# Patient Record
Sex: Female | Born: 1980
Health system: Southern US, Community
[De-identification: ages and names within clinical notes are randomized; demographics above are authoritative.]

## PROBLEM LIST (undated history)

## (undated) DIAGNOSIS — Z590 Homelessness: Secondary | ICD-10-CM

## (undated) DIAGNOSIS — D649 Anemia, unspecified: Secondary | ICD-10-CM

## (undated) DIAGNOSIS — M1990 Unspecified osteoarthritis, unspecified site: Secondary | ICD-10-CM

## (undated) DIAGNOSIS — C469 Kaposi's sarcoma, unspecified: Secondary | ICD-10-CM

## (undated) DIAGNOSIS — F419 Anxiety disorder, unspecified: Secondary | ICD-10-CM

## (undated) DIAGNOSIS — F339 Major depressive disorder, recurrent, unspecified: Secondary | ICD-10-CM

## (undated) DIAGNOSIS — Z5189 Encounter for other specified aftercare: Secondary | ICD-10-CM

## (undated) DIAGNOSIS — F329 Major depressive disorder, single episode, unspecified: Secondary | ICD-10-CM

## (undated) DIAGNOSIS — L0291 Cutaneous abscess, unspecified: Secondary | ICD-10-CM

## (undated) DIAGNOSIS — J45909 Unspecified asthma, uncomplicated: Secondary | ICD-10-CM

## (undated) DIAGNOSIS — J189 Pneumonia, unspecified organism: Secondary | ICD-10-CM

## (undated) DIAGNOSIS — L98499 Non-pressure chronic ulcer of skin of other sites with unspecified severity: Secondary | ICD-10-CM

## (undated) DIAGNOSIS — F32A Depression, unspecified: Secondary | ICD-10-CM

## (undated) DIAGNOSIS — Z21 Asymptomatic human immunodeficiency virus [HIV] infection status: Secondary | ICD-10-CM

## (undated) DIAGNOSIS — B20 Human immunodeficiency virus [HIV] disease: Secondary | ICD-10-CM

## (undated) DIAGNOSIS — L989 Disorder of the skin and subcutaneous tissue, unspecified: Secondary | ICD-10-CM

## (undated) DIAGNOSIS — G63 Polyneuropathy in diseases classified elsewhere: Secondary | ICD-10-CM

## (undated) HISTORY — DX: Anxiety disorder, unspecified: F41.9

## (undated) HISTORY — DX: Major depressive disorder, recurrent, unspecified: F33.9

## (undated) HISTORY — DX: Asymptomatic human immunodeficiency virus (hiv) infection status: Z21

## (undated) HISTORY — DX: Major depressive disorder, single episode, unspecified: F32.9

## (undated) HISTORY — DX: Homelessness: Z59.0

## (undated) HISTORY — DX: Human immunodeficiency virus (HIV) disease: B20

## (undated) HISTORY — DX: Disorder of the skin and subcutaneous tissue, unspecified: L98.9

## (undated) HISTORY — DX: Non-pressure chronic ulcer of skin of other sites with unspecified severity: L98.499

## (undated) HISTORY — DX: Kaposi's sarcoma, unspecified: C46.9

## (undated) HISTORY — DX: Depression, unspecified: F32.A

## (undated) HISTORY — DX: Polyneuropathy in diseases classified elsewhere: G63

---

## 2003-06-23 HISTORY — PX: TUBAL LIGATION: SHX77

## 2003-08-01 ENCOUNTER — Inpatient Hospital Stay (HOSPITAL_COMMUNITY): Admission: AD | Admit: 2003-08-01 | Discharge: 2003-08-01 | Payer: Self-pay | Admitting: Obstetrics and Gynecology

## 2003-08-28 ENCOUNTER — Inpatient Hospital Stay (HOSPITAL_COMMUNITY): Admission: AD | Admit: 2003-08-28 | Discharge: 2003-08-28 | Payer: Self-pay | Admitting: Family Medicine

## 2003-09-20 ENCOUNTER — Inpatient Hospital Stay (HOSPITAL_COMMUNITY): Admission: AD | Admit: 2003-09-20 | Discharge: 2003-09-20 | Payer: Self-pay | Admitting: Obstetrics & Gynecology

## 2003-12-03 ENCOUNTER — Encounter: Admission: RE | Admit: 2003-12-03 | Discharge: 2003-12-03 | Payer: Self-pay | Admitting: Internal Medicine

## 2003-12-03 ENCOUNTER — Ambulatory Visit (HOSPITAL_COMMUNITY): Admission: RE | Admit: 2003-12-03 | Discharge: 2003-12-03 | Payer: Self-pay | Admitting: Internal Medicine

## 2003-12-03 ENCOUNTER — Encounter (INDEPENDENT_AMBULATORY_CARE_PROVIDER_SITE_OTHER): Payer: Self-pay | Admitting: *Deleted

## 2003-12-03 LAB — CONVERTED CEMR LAB
CD4 Count: 350 microliters
CD4 T Cell Abs: 350

## 2003-12-18 ENCOUNTER — Encounter: Admission: RE | Admit: 2003-12-18 | Discharge: 2003-12-18 | Payer: Self-pay | Admitting: Internal Medicine

## 2004-01-01 ENCOUNTER — Encounter: Admission: RE | Admit: 2004-01-01 | Discharge: 2004-01-01 | Payer: Self-pay | Admitting: Internal Medicine

## 2004-01-16 ENCOUNTER — Encounter: Admission: RE | Admit: 2004-01-16 | Discharge: 2004-01-16 | Payer: Self-pay | Admitting: Internal Medicine

## 2004-01-16 ENCOUNTER — Ambulatory Visit (HOSPITAL_COMMUNITY): Admission: RE | Admit: 2004-01-16 | Discharge: 2004-01-16 | Payer: Self-pay | Admitting: Internal Medicine

## 2004-01-16 ENCOUNTER — Encounter (INDEPENDENT_AMBULATORY_CARE_PROVIDER_SITE_OTHER): Payer: Self-pay | Admitting: *Deleted

## 2004-01-16 LAB — CONVERTED CEMR LAB
CD4 Count: 350 microliters
HIV 1 RNA Quant: 399 copies/mL

## 2004-01-29 ENCOUNTER — Encounter (INDEPENDENT_AMBULATORY_CARE_PROVIDER_SITE_OTHER): Payer: Self-pay | Admitting: *Deleted

## 2004-01-29 ENCOUNTER — Inpatient Hospital Stay (HOSPITAL_COMMUNITY): Admission: RE | Admit: 2004-01-29 | Discharge: 2004-02-01 | Payer: Self-pay | Admitting: Obstetrics

## 2004-04-10 ENCOUNTER — Encounter (INDEPENDENT_AMBULATORY_CARE_PROVIDER_SITE_OTHER): Payer: Self-pay | Admitting: *Deleted

## 2004-04-10 ENCOUNTER — Ambulatory Visit (HOSPITAL_COMMUNITY): Admission: RE | Admit: 2004-04-10 | Discharge: 2004-04-10 | Payer: Self-pay | Admitting: Internal Medicine

## 2004-04-10 ENCOUNTER — Ambulatory Visit: Payer: Self-pay | Admitting: Internal Medicine

## 2004-04-10 LAB — CONVERTED CEMR LAB
CD4 Count: 450 microliters
HIV 1 RNA Quant: 4500 copies/mL

## 2004-06-13 ENCOUNTER — Emergency Department (HOSPITAL_COMMUNITY): Admission: EM | Admit: 2004-06-13 | Discharge: 2004-06-13 | Payer: Self-pay | Admitting: Emergency Medicine

## 2004-11-19 ENCOUNTER — Emergency Department (HOSPITAL_COMMUNITY): Admission: EM | Admit: 2004-11-19 | Discharge: 2004-11-19 | Payer: Self-pay | Admitting: Emergency Medicine

## 2004-11-24 ENCOUNTER — Inpatient Hospital Stay (HOSPITAL_COMMUNITY): Admission: EM | Admit: 2004-11-24 | Discharge: 2004-11-27 | Payer: Self-pay | Admitting: Emergency Medicine

## 2004-11-24 ENCOUNTER — Ambulatory Visit: Payer: Self-pay | Admitting: Infectious Diseases

## 2004-12-24 ENCOUNTER — Ambulatory Visit: Payer: Self-pay | Admitting: Infectious Diseases

## 2005-08-13 ENCOUNTER — Emergency Department (HOSPITAL_COMMUNITY): Admission: EM | Admit: 2005-08-13 | Discharge: 2005-08-13 | Payer: Self-pay | Admitting: Emergency Medicine

## 2005-08-19 ENCOUNTER — Emergency Department (HOSPITAL_COMMUNITY): Admission: EM | Admit: 2005-08-19 | Discharge: 2005-08-19 | Payer: Self-pay | Admitting: Emergency Medicine

## 2006-07-05 ENCOUNTER — Emergency Department (HOSPITAL_COMMUNITY): Admission: EM | Admit: 2006-07-05 | Discharge: 2006-07-05 | Payer: Self-pay | Admitting: Emergency Medicine

## 2006-08-09 ENCOUNTER — Emergency Department (HOSPITAL_COMMUNITY): Admission: EM | Admit: 2006-08-09 | Discharge: 2006-08-09 | Payer: Self-pay | Admitting: Emergency Medicine

## 2006-08-16 ENCOUNTER — Encounter (INDEPENDENT_AMBULATORY_CARE_PROVIDER_SITE_OTHER): Payer: Self-pay | Admitting: *Deleted

## 2006-08-16 LAB — CONVERTED CEMR LAB

## 2006-08-29 ENCOUNTER — Encounter (INDEPENDENT_AMBULATORY_CARE_PROVIDER_SITE_OTHER): Payer: Self-pay | Admitting: *Deleted

## 2008-06-20 ENCOUNTER — Encounter: Payer: Self-pay | Admitting: Infectious Diseases

## 2008-06-28 ENCOUNTER — Ambulatory Visit: Payer: Self-pay | Admitting: Infectious Diseases

## 2008-06-28 DIAGNOSIS — B20 Human immunodeficiency virus [HIV] disease: Secondary | ICD-10-CM | POA: Insufficient documentation

## 2008-06-28 LAB — CONVERTED CEMR LAB
ALT: 14 units/L (ref 0–35)
AST: 16 units/L (ref 0–37)
Albumin: 3.9 g/dL (ref 3.5–5.2)
Alkaline Phosphatase: 88 units/L (ref 39–117)
BUN: 9 mg/dL (ref 6–23)
Basophils Absolute: 0 10*3/uL (ref 0.0–0.1)
Basophils Relative: 0 % (ref 0–1)
Bilirubin Urine: NEGATIVE
CO2: 21 meq/L (ref 19–32)
Calcium: 8.7 mg/dL (ref 8.4–10.5)
Chlamydia, Swab/Urine, PCR: NEGATIVE
Chloride: 105 meq/L (ref 96–112)
Cholesterol: 140 mg/dL (ref 0–200)
Creatinine, Ser: 0.51 mg/dL (ref 0.40–1.20)
Eosinophils Absolute: 0.1 10*3/uL (ref 0.0–0.7)
Eosinophils Relative: 3 % (ref 0–5)
GC Probe Amp, Urine: NEGATIVE
Glucose, Bld: 90 mg/dL (ref 70–99)
HCT: 39.5 % (ref 36.0–46.0)
HCV Ab: NEGATIVE
HDL: 48 mg/dL (ref >39)
HIV 1 RNA Quant: 125000 copies/mL — ABNORMAL HIGH (ref <48)
HIV-1 RNA Quant, Log: 5.1 — ABNORMAL HIGH (ref <1.68)
HIV-1 antibody: POSITIVE — AB
HIV-2 Ab: NEGATIVE
HIV: REACTIVE
Hemoglobin, Urine: NEGATIVE
Hemoglobin: 12.4 g/dL (ref 12.0–15.0)
Hep B Core Total Ab: NEGATIVE
Hep B S Ab: NEGATIVE
Hepatitis B Surface Ag: NEGATIVE
Ketones, ur: NEGATIVE mg/dL
LDL Cholesterol: 78 mg/dL (ref 0–99)
Leukocytes, UA: NEGATIVE
Lymphocytes Relative: 38 % (ref 12–46)
Lymphs Abs: 1 10*3/uL (ref 0.7–4.0)
MCHC: 31.4 g/dL (ref 30.0–36.0)
MCV: 78.4 fL (ref 78.0–100.0)
Monocytes Absolute: 0.3 10*3/uL (ref 0.1–1.0)
Monocytes Relative: 12 % (ref 3–12)
Neutro Abs: 1.2 10*3/uL — ABNORMAL LOW (ref 1.7–7.7)
Neutrophils Relative %: 48 % (ref 43–77)
Nitrite: NEGATIVE
Platelets: 138 10*3/uL — ABNORMAL LOW (ref 150–400)
Potassium: 3.5 meq/L (ref 3.5–5.3)
Protein, ur: NEGATIVE mg/dL
RBC: 5.04 M/uL (ref 3.87–5.11)
RDW: 15.8 % — ABNORMAL HIGH (ref 11.5–15.5)
Sodium: 138 meq/L (ref 135–145)
Specific Gravity, Urine: 1.022 (ref 1.005–1.03)
Total Bilirubin: 1.1 mg/dL (ref 0.3–1.2)
Total CHOL/HDL Ratio: 2.9
Total Protein: 8.1 g/dL (ref 6.0–8.3)
Triglycerides: 72 mg/dL (ref <150)
Urine Glucose: NEGATIVE mg/dL
Urobilinogen, UA: 1 (ref 0.0–1.0)
VLDL: 14 mg/dL (ref 0–40)
WBC: 2.6 10*3/uL — ABNORMAL LOW (ref 4.0–10.5)
pH: 7.5 (ref 5.0–8.0)

## 2008-07-17 ENCOUNTER — Ambulatory Visit: Payer: Self-pay | Admitting: Infectious Diseases

## 2008-07-17 ENCOUNTER — Encounter (INDEPENDENT_AMBULATORY_CARE_PROVIDER_SITE_OTHER): Payer: Self-pay | Admitting: *Deleted

## 2008-07-17 ENCOUNTER — Telehealth: Payer: Self-pay | Admitting: Infectious Diseases

## 2008-07-17 DIAGNOSIS — F329 Major depressive disorder, single episode, unspecified: Secondary | ICD-10-CM

## 2008-07-17 DIAGNOSIS — F419 Anxiety disorder, unspecified: Secondary | ICD-10-CM | POA: Insufficient documentation

## 2008-10-01 ENCOUNTER — Encounter: Payer: Self-pay | Admitting: Infectious Diseases

## 2008-12-05 ENCOUNTER — Telehealth (INDEPENDENT_AMBULATORY_CARE_PROVIDER_SITE_OTHER): Payer: Self-pay | Admitting: *Deleted

## 2009-05-27 ENCOUNTER — Emergency Department (HOSPITAL_COMMUNITY): Admission: EM | Admit: 2009-05-27 | Discharge: 2009-05-27 | Payer: Self-pay | Admitting: Emergency Medicine

## 2009-09-20 ENCOUNTER — Emergency Department (HOSPITAL_COMMUNITY): Admission: EM | Admit: 2009-09-20 | Discharge: 2009-09-20 | Payer: Self-pay | Admitting: Emergency Medicine

## 2009-10-16 ENCOUNTER — Telehealth: Payer: Self-pay

## 2009-11-07 ENCOUNTER — Encounter: Payer: Self-pay | Admitting: Infectious Diseases

## 2010-01-01 ENCOUNTER — Telehealth (INDEPENDENT_AMBULATORY_CARE_PROVIDER_SITE_OTHER): Payer: Self-pay | Admitting: Licensed Clinical Social Worker

## 2010-01-01 ENCOUNTER — Ambulatory Visit: Payer: Self-pay | Admitting: Internal Medicine

## 2010-01-02 ENCOUNTER — Encounter: Payer: Self-pay | Admitting: Internal Medicine

## 2010-01-02 LAB — CONVERTED CEMR LAB
ALT: 13 units/L (ref 0–35)
AST: 22 units/L (ref 0–37)
Albumin: 3.4 g/dL — ABNORMAL LOW (ref 3.5–5.2)
Alkaline Phosphatase: 78 units/L (ref 39–117)
BUN: 12 mg/dL (ref 6–23)
Basophils Absolute: 0 10*3/uL (ref 0.0–0.1)
Basophils Relative: 0 % (ref 0–1)
CO2: 25 meq/L (ref 19–32)
Calcium: 8.3 mg/dL — ABNORMAL LOW (ref 8.4–10.5)
Chlamydia, Swab/Urine, PCR: NEGATIVE
Chloride: 106 meq/L (ref 96–112)
Creatinine, Ser: 0.62 mg/dL (ref 0.40–1.20)
Eosinophils Absolute: 0.1 10*3/uL (ref 0.0–0.7)
Eosinophils Relative: 6 % — ABNORMAL HIGH (ref 0–5)
GC Probe Amp, Urine: NEGATIVE
Glucose, Bld: 88 mg/dL (ref 70–99)
HCT: 32.2 % — ABNORMAL LOW (ref 36.0–46.0)
HIV 1 RNA Quant: 176000 copies/mL — ABNORMAL HIGH (ref <48)
HIV-1 RNA Quant, Log: 5.25 — ABNORMAL HIGH (ref <1.68)
Hemoglobin: 10.3 g/dL — ABNORMAL LOW (ref 12.0–15.0)
Lymphocytes Relative: 34 % (ref 12–46)
Lymphs Abs: 0.8 10*3/uL (ref 0.7–4.0)
MCHC: 32 g/dL (ref 30.0–36.0)
MCV: 81.1 fL (ref 78.0–100.0)
Monocytes Absolute: 0.2 10*3/uL (ref 0.1–1.0)
Monocytes Relative: 10 % (ref 3–12)
Neutro Abs: 1.2 10*3/uL — ABNORMAL LOW (ref 1.7–7.7)
Neutrophils Relative %: 51 % (ref 43–77)
Platelets: 112 10*3/uL — ABNORMAL LOW (ref 150–400)
Potassium: 3.6 meq/L (ref 3.5–5.3)
RBC: 3.97 M/uL (ref 3.87–5.11)
RDW: 14.9 % (ref 11.5–15.5)
Sodium: 140 meq/L (ref 135–145)
Total Bilirubin: 0.6 mg/dL (ref 0.3–1.2)
Total Protein: 7.6 g/dL (ref 6.0–8.3)
WBC: 2.4 10*3/uL — ABNORMAL LOW (ref 4.0–10.5)

## 2010-01-14 ENCOUNTER — Encounter (INDEPENDENT_AMBULATORY_CARE_PROVIDER_SITE_OTHER): Payer: Self-pay | Admitting: *Deleted

## 2010-01-17 ENCOUNTER — Ambulatory Visit: Payer: Self-pay | Admitting: Internal Medicine

## 2010-01-17 DIAGNOSIS — D069 Carcinoma in situ of cervix, unspecified: Secondary | ICD-10-CM

## 2010-01-21 ENCOUNTER — Encounter (INDEPENDENT_AMBULATORY_CARE_PROVIDER_SITE_OTHER): Payer: Self-pay | Admitting: *Deleted

## 2010-01-22 ENCOUNTER — Encounter: Payer: Self-pay | Admitting: Internal Medicine

## 2010-01-22 LAB — CONVERTED CEMR LAB

## 2010-01-24 ENCOUNTER — Encounter: Payer: Self-pay | Admitting: Internal Medicine

## 2010-01-31 ENCOUNTER — Encounter: Payer: Self-pay | Admitting: Internal Medicine

## 2010-03-20 ENCOUNTER — Telehealth (INDEPENDENT_AMBULATORY_CARE_PROVIDER_SITE_OTHER): Payer: Self-pay | Admitting: *Deleted

## 2010-03-28 ENCOUNTER — Encounter: Payer: Self-pay | Admitting: Internal Medicine

## 2010-05-06 ENCOUNTER — Encounter (INDEPENDENT_AMBULATORY_CARE_PROVIDER_SITE_OTHER): Payer: Self-pay | Admitting: *Deleted

## 2010-06-08 ENCOUNTER — Emergency Department (HOSPITAL_COMMUNITY)
Admission: EM | Admit: 2010-06-08 | Discharge: 2010-06-08 | Payer: Self-pay | Source: Home / Self Care | Admitting: Emergency Medicine

## 2010-07-22 NOTE — Miscellaneous (Signed)
Summary: Orders Update  Clinical Lists Changes  Orders: Added new Test order of T-CD4SP [facility]) (CD4SP) - Signed Added new Test order of T-CBC w/Diff [phone]) - Signed Added new Test order of T-Comprehensive Metabolic Panel [phone]) - Signed Added new Test order of T-RPR (Syphilis) [phone]) - Signed Added new Test order of T-HIV Viral Load [phone]) - Signed Added new Test order of T-GC Probe, urine [phone]) - Signed Added new Test order of T-Chlamydia  Probe, urine [phone]) - Signed

## 2010-07-22 NOTE — Miscellaneous (Signed)
Summary: clinical update/ryan white NCADAP app completed  Clinical Lists Changes  Observations: Added new observation of PCTFPL: 41.88  (01/21/2010 9:54) Added new observation of HOUSEINCOME: 32951  (01/21/2010 9:54) Added new observation of FINASSESSDT: 01/17/2010  (01/21/2010 9:54)

## 2010-07-22 NOTE — Progress Notes (Signed)
Summary: Needs bridge management  Phone Note Other Incoming   Summary of Call: Reviewed my list of patients.  She has not been seen in over a year.  Has bridge management contacted her? Beverly [name] Initial call taken by: Clydie Braun MD,  October 16, 2009 9:31 PM

## 2010-07-22 NOTE — Miscellaneous (Signed)
Summary: RW Update  Clinical Lists Changes  Observations: Added new observation of PAYOR: Medicaid (03/28/2010 11:00)

## 2010-07-22 NOTE — Progress Notes (Signed)
Summary: Missed GYN appt. 9/14, pt. needs to call for GYN appt. [facility]  Phone Note Outgoing Call   Call placed by: Jennet Maduro RN,  March 20, 2010 12:24 PM Call placed to: Patient Summary of Call: Message left.  Pt. needs to Call [facility] clinic to reschedule a missed appt. for Colpo following abnormal pap smear.  GYN Clinic number left for pt. to call for appt. Jennet Maduro RN  March 20, 2010 12:25 PM

## 2010-07-22 NOTE — Miscellaneous (Signed)
Summary: Problem list update  Clinical Lists Changes  Problems: Added new problem of SCREENING FOR MALIGNANT NEOPLASM OF THE CERVIX (ICD-V76.2) 

## 2010-07-22 NOTE — Miscellaneous (Signed)
Summary: clinical update/Beverly [name] ncadap apprv til 09/20/10  Clinical Lists Changes  Medications: Rx of BACTRIM DS 800-160 MG TABS (SULFAMETHOXAZOLE-TRIMETHOPRIM) one daily;  #30 x 6;  Signed;  Entered by: Paulo Fruit  BS,CPht II,MPH;  Authorized by: Yisroel Ramming MD;  Method used: Electronically to [facility] [phone]*, [address], [place], [place]  [address], Ph: [phone], Fax:  Rx of AZITHROMYCIN 600 MG TABS (AZITHROMYCIN) 2 by mouth once a week;  #8 x 6;  Signed;  Entered by: Paulo Fruit  BS,CPht II,MPH;  Authorized by: Yisroel Ramming MD;  Method used: Electronically to [facility] [phone]*, [address], [place], [place]  [address], Ph: [phone], Fax:  Rx of ATRIPLA 600-200-300 MG TABS (EFAVIRENZ-EMTRICITAB-TENOFOVIR) one by mouth once daily;  #30 x 6;  Signed;  Entered by: Paulo Fruit  BS,CPht II,MPH;  Authorized by: Yisroel Ramming MD;  Method used: Electronically to [facility] [phone]*, [address], [place], [place]  [address], Ph: [phone], Fax: Observations: Added new observation of AIDSDAP: Yes 2011 (01/31/2010 11:02)    Prescriptions: ATRIPLA 600-200-300 MG TABS (EFAVIRENZ-EMTRICITAB-TENOFOVIR) one by mouth once daily  #30 x 6   Entered by:   Paulo Fruit  BS,CPht II,MPH   Authorized by:   Yisroel Ramming MD   Signed by:   Paulo Fruit  BS,CPht II,MPH on 01/31/2010   Method used:   Electronically to        [facility] [phone]* (retail)       [address]       [place], [place]  [address]       Ph: [phone]       Fax:    RxID:   [id number] AZITHROMYCIN 600 MG TABS (AZITHROMYCIN) 2 by mouth once a week  #8 x 6   Entered by:   Paulo Fruit  BS,CPht II,MPH   Authorized by:   Yisroel Ramming MD   Signed by:   Paulo Fruit  BS,CPht II,MPH on 01/31/2010   Method used:   Electronically to        [facility] [phone]* (retail)       [address]       [place], [place]  [address]       Ph: [phone]       Fax:    RxID:   [id number] BACTRIM DS 800-160 MG TABS (SULFAMETHOXAZOLE-TRIMETHOPRIM)  one daily  #30 x 6   Entered by:   Paulo Fruit  BS,CPht II,MPH   Authorized by:   Yisroel Ramming MD   Signed by:   Paulo Fruit  BS,CPht II,MPH on 01/31/2010   Method used:   Electronically to        [facility] [phone]* (retail)       [address]       [place], [place]  [address]       Ph: [phone]       Fax:    RxID:   [id number]  Paulo Fruit  BS,CPht II,MPH  January 31, 2010 11:03 AM

## 2010-07-22 NOTE — Assessment & Plan Note (Signed)
Summary: f/u labs   Primary Provider:  Clydie Braun MD  CC:  follow-up visit, lab results, and c/o boils underarms.  History of Present Illness: Pt here to re-establish. She took her Atripla for a short period of time but then lost her medicaid.  She did not have any side effects. She re-applied for her Medicaid and got her card but her status in our computer systme is inactive.  She was instructed to speak to her Medicaid worker. She is depressed and anxious.  She recently lost her mother to cancer and her father was just diagnose with cancer.  She is taking care of her 4 children by herself and feels very stressed. She is not suicidal. She wants to be ther for her children.  She has trouble dealing with the fact that she is HIV positive and the only person in hr family that knows is her sister. Pt c/o recurrent boils, otherwise feels well.  Preventive Screening-Counseling & Management  Alcohol-Tobacco     Alcohol drinks/day: occasional     Smoking Status: current     Smoking Cessation Counseling: yes     Packs/Day: 2-3 cig     Year Started: 2005  Caffeine-Diet-Exercise     Caffeine use/day: tea, soda 4 per day     Does Patient Exercise: yes     Type of exercise: WALKING     Times/week: 5  Hep-HIV-STD-Contraception     Sun Exposure-Excessive: occasionally  Safety-Violence-Falls     Seat Belt Use: 100      Sexual History:  dating.        Drug Use:  Yes.    Comments: pt. given condoms   Updated Prior Medication List: ALPRAZOLAM 0.5 MG TBDP (ALPRAZOLAM) two times a day BACTRIM DS 800-160 MG TABS (SULFAMETHOXAZOLE-TRIMETHOPRIM) one daily AZITHROMYCIN 600 MG TABS (AZITHROMYCIN) 2 by mouth once a week ATRIPLA 600-200-300 MG TABS (EFAVIRENZ-EMTRICITAB-TENOFOVIR) one by mouth once daily  Current Allergies (reviewed today): No known allergies  Past History:  Past Medical History: Last updated: 07/17/2008 HIV - dxed during pregnancy 2005 - with her fourth child - child  without HIV PNA- strep 2006 Asthma - stable Anxiety Anemia  Social History: Sexual History:  dating  Review of Systems  The patient denies anorexia, fever, and weight loss.    Vital Signs:  Patient profile:   30 year old female Height:      61.5 inches (156.21 cm) Weight:      162.8 pounds (74 kg) BMI:     30.37 Temp:     98.4 degrees F (36.89 degrees C) oral Pulse rate:   67 / minute BP sitting:   119 / 81  (left arm)  Vitals Entered By: Wendall Mola CMA Duncan Dull) (January 17, 2010 3:15 PM) CC: follow-up visit, lab results, c/o boils underarms Is Patient Diabetic? No Pain Assessment Patient in pain? no      Nutritional Status BMI of > 30 = obese Nutritional Status Detail appetite "good"  Does patient need assistance? Functional Status Self care Ambulation Normal Comments pt. has been out of meds for one month, lost Medicaid and Juanell Fairly needs to be updated   Physical Exam  General:  alert, well-developed, well-nourished, and well-hydrated.  tearful Head:  normocephalic and atraumatic.   Mouth:  pharynx pink and moist.  no thrush  Lungs:  normal breath sounds.   Heart:  normal rate and regular rhythm.      Impression & Recommendations:  Problem # 1:  HIV  INFECTION (ICD-042) Discussed need to re-start HIV meds and prophylactic meds. Discussed the importance of taking them consistantly.  Will have her meet with Byrd Hesselbach to enroll in ADAP in case she does not have Medicaid. She will return in 6 weeks for repeat labs. Diagnostics Reviewed:  HIV: REACTIVE (06/28/2008)   HIV-Western blot: Positive (06/28/2008)   CD4: <10 cmm (01/03/2010)   WBC: 2.4 (01/02/2010)   Hgb: 10.3 (01/02/2010)   HCT: 32.2 (01/02/2010)   Platelets: 112 (01/02/2010) HIV genotype: See Comment (06/28/2008)   HIV-1 RNA: 125000 (06/28/2008)   HBSAg: NEG (06/28/2008)  Orders: Est. Patient Level IV (99214)Future Orders: T-CD4SP ([facility]) (CD4SP) ... 02/28/2010 T-HIV Viral Load [id number])  ... 02/28/2010 T-Comprehensive Metabolic Panel [phone]) ... 02/28/2010 T-CBC w/Diff ([id number]) ... 02/28/2010  Problem # 2:  ANXIETY STATE, UNSPECIFIED (ICD-300.00) refer to [facility] for counseling xana for anxiety Her updated medication list for this problem includes:    Alprazolam 0.5 Mg Tbdp (Alprazolam) .Marland Kitchen..Marland Kitchen Two times a day  Problem # 3:  SCREENING FOR MALIGNANT NEOPLASM OF THE CERVIX (ICD-V76.2) PAP today  Patient Instructions: 1)  Please schedule a follow-up appointment in 8 weeks, 2 weeks after labs.  Prescriptions: ALPRAZOLAM 0.5 MG TBDP (ALPRAZOLAM) two times a day  #60 x 0   Entered and Authorized by:   Yisroel Ramming MD   Signed by:   Yisroel Ramming MD on 01/17/2010   Method used:   Print then Give to Patient   RxID:   [id number]

## 2010-07-22 NOTE — Progress Notes (Signed)
Summary: patient wanting labs/office visit.  Phone Note Call from Patient   Caller: Patient Summary of Call: Patient called today wanting an appt to see the doctor and lab. I scheduled her an appt for today for labs and 01/15/2010 for office visit. She states she has been taking her meds but her rx was about to run out thats why she needed to be seen. I  talked to [facility] where she states she was getting her meds and the pharmacists stated she haven't p/u rx since last year 11/2008. Not sure if the patient was getting meds somewhere else, or just not taking them. Initial call taken by: Starleen Arms CMA,  January 01, 2010 10:29 AM

## 2010-07-22 NOTE — Miscellaneous (Signed)
Summary: Orders Update - GYN Referral  Clinical Lists Changes  Problems: Added new problem of PAP SMER CERV W/LW GRADE SQUAMOUS INTRAEPITH LES (ICD-795.03) Orders: Added new Referral order of Gynecologic Referral (Gyn) - Signed

## 2010-07-22 NOTE — Letter (Signed)
Summary: Return Notice    Return Notice   Imported By: Florinda Marker 12/30/2009 14:11:39  _____________________________________________________________________  External Attachment:    Type:   Image     Comment:   External Document

## 2010-07-22 NOTE — Assessment & Plan Note (Signed)
Summary: Pap visit   Vital Signs:  Patient profile:   30 year old female Menstrual status:  irregular LMP:     12/16/2009 Height:      61.5 inches (156.21 cm) Weight:      162.8 pounds (74 kg) BMI:     30.37 Temp:     98.4 degrees F (36.89 degrees C) oral Pulse rate:   67 / minute BP sitting:   119 / 81  (left arm)  Vitals Entered By: Wendall Mola CMA Duncan Dull) (January 17, 2010 3:06 PM)  Patient Instructions: 1)  Your results will come back in about a week.  I will send you a letter in the mail at that time. 2)  Please schedule a follow-up appointment in 1 year.   CC:  PAP smear visit.  Pt given condoms.  Pt given educational materials re:  HIV and women, diet, exercise, BSE, PAP smears, and self-esteem and prevention for positives..  CC: PAP smear visit.  Pt given condoms.  Pt given educational materials re:  HIV and women, diet, exercise, BSE, PAP smears, self-esteem and prevention for positives. Is Patient Diabetic? No Pain Assessment Patient in pain? no      Nutritional Status BMI of > 30 = obese Nutritional Status Detail appetite "good" LMP (date): 12/16/2009 LMP - Character: normal     Menstrual Status irregular Enter LMP: 12/16/2009   Preventive Screening-Counseling & Management  Alcohol-Tobacco     Alcohol drinks/day: occasional     Smoking Status: current     Smoking Cessation Counseling: yes     Packs/Day: 2-3 cig     Year Started: 2005  Caffeine-Diet-Exercise     Caffeine use/day: tea,soda 4 per day     Does Patient Exercise: yes     Type of exercise: WALKING     Times/week: 5  Hep-HIV-STD-Contraception     Sun Exposure-Excessive: occasionally  Safety-Violence-Falls     Seat Belt Use: 100      Sexual History:  dating.        Drug Use:  Yes.    Comments: pt. given condoms  Behavioral Health Assessment Frequency: occasional Do you use recreational drugs? Yes  Evaluation and Follow-Up  Prevention For Positives: 01/17/2010   Safe  sex practices discussed with patient. Condoms offered. Prior Medications: ALPRAZOLAM 0.5 MG TBDP (ALPRAZOLAM) two times a day BACTRIM DS 800-160 MG TABS (SULFAMETHOXAZOLE-TRIMETHOPRIM) one daily AZITHROMYCIN 600 MG TABS (AZITHROMYCIN) 2 by mouth once a week ATRIPLA 600-200-300 MG TABS (EFAVIRENZ-EMTRICITAB-TENOFOVIR) one by mouth once daily Current Allergies: No known allergies  Orders Added: 1)  Est. Patient Level I [[address]109] 2)  T-PAP [facility]) [[address]]              Prevention For Positives: 01/17/2010   Safe sex practices discussed with patient. Condoms offered.

## 2010-07-22 NOTE — Letter (Signed)
Summary: Results Follow-up Letter  [facility]  [address]   [place], [place] [address]   Phone: [phone]  Fax: [phone]       January 24, 2010  [address] [place], [place]  [address]  Dear Ms. Hodge,   The following are the results of your recent test(s):  Test     Result     Pap Smear    Normal_______  Not Normal_XXX____       Comments:  Your PAP smear results returned abnormal.  Dr. Philipp Deputy has ordered a referral to the [facility].  Your appointment is:   Date/Time:  Friday, February 28, 2010 at 12:45 PM  Please arrive          15 minutes early and bring your [employer] or any insurance cards with you.   Address:   [address]       [place], [place] [address]   Tel. #:   [phone]  Please call the GYN Clinic if you need to reschedule this appointment.  As a courtesy to other patients if you need to cancel your appointment please do so at least 24 hours in advance.  Thank you.  Sincerely,    Jennet Maduro [facility]

## 2010-07-22 NOTE — Miscellaneous (Signed)
  Clinical Lists Changes  Observations: Added new observation of YEARAIDSPOS: 2010  (05/06/2010 15:38)

## 2010-08-09 ENCOUNTER — Encounter (INDEPENDENT_AMBULATORY_CARE_PROVIDER_SITE_OTHER): Payer: Self-pay | Admitting: *Deleted

## 2010-08-13 NOTE — Miscellaneous (Signed)
  Clinical Lists Changes  Observations: Added new observation of PAYOR: No Insurance (08/09/2010 12:04)

## 2010-08-18 ENCOUNTER — Emergency Department (HOSPITAL_COMMUNITY)
Admission: EM | Admit: 2010-08-18 | Discharge: 2010-08-18 | Disposition: A | Discharge status: Home / Self Care | Payer: Self-pay | Attending: Emergency Medicine | Admitting: Emergency Medicine

## 2010-08-18 DIAGNOSIS — L03319 Cellulitis of trunk, unspecified: Secondary | ICD-10-CM | POA: Insufficient documentation

## 2010-08-18 DIAGNOSIS — L02219 Cutaneous abscess of trunk, unspecified: Secondary | ICD-10-CM | POA: Insufficient documentation

## 2010-09-07 LAB — T-HELPER CELL (CD4) - (RCID CLINIC ONLY)
CD4 % Helper T Cell: 3 % — ABNORMAL LOW (ref 33–55)
CD4 T Cell Abs: <10 uL — ABNORMAL LOW (ref 400–2700)

## 2010-10-06 LAB — T-HELPER CELL (CD4) - (RCID CLINIC ONLY)
CD4 % Helper T Cell: 7 % — ABNORMAL LOW (ref 33–55)
CD4 T Cell Abs: 50 uL — ABNORMAL LOW (ref 400–2700)

## 2010-11-07 NOTE — Op Note (Signed)
NAMEGALYA, Beverly [name]                        ACCOUNT NO.:  [id number]   MEDICAL RECORD NO.:  [id number]                   PATIENT TYPE:  INP   LOCATION:  9116                                 FACILITY:  [facility]   PHYSICIAN:  Kathreen Cosier, M.D.           DATE OF BIRTH:  1981-03-03   DATE OF PROCEDURE:  DATE OF DISCHARGE:                                 OPERATIVE REPORT   PREOPERATIVE DIAGNOSIS:  Positive human immunodeficiency virus, multiparity,  elective cesarean section, tubal ligation.   POSTOPERATIVE DIAGNOSIS:  Positive human immunodeficiency virus,  multiparity, elective cesarean section, tubal ligation.   OPERATION PERFORMED:   SURGEON:  Kathreen Cosier, M.D.   FIRST ASSISTANT:  Dr. _________   ANESTHESIA:  Spinal.   DESCRIPTION OF PROCEDURE:  Patient placed on the operating table in supine  position after the spinal administered.  Abdomen prepped and draped.  Bladder emptied with a Foley catheter.  Transverse suprapubic incision was  made and carried down to the rectus fascia.  Fascia _________  the left of  incision.  Rectus muscles were retracted laterally.  Peritoneum __________.  Transverse incision made in the visceral peritoneum.  Bladder flap mobilized  inferiorly.  Transverse lower uterine incision.  Fluid was clear.  The  patient was delivered from the LOA position.  The cord was prolapsed beside  the head.  She had a female, Apgar 9 and 9, weighing 6 pounds, 14 ounces.  A  team was in attendance and fluid was clear.  Placenta was anterior and  removed manually.  Uterine cavity cleaned with dry laps.  Uterine incision  was closed in one layer with continuous suture of #1 chromic.  Hemostasis  was satisfactory.  Bladder flap reattached with 2-0 chromic.  Tubes and  ovaries normal.  The right tube grasped in the midportion with a Babcock  clamp.  0 plain suture placed in mesosalpinx below the portion of the tube  that had been clamped.  This was tied on  both sides and approximately one  inch of tube transected.  Hemostasis was satisfactory.  The procedure was  done in exact fashion the other side.  Lap and sponge counts correct.  Abdomen closed in layers.  Peritoneum continuous suture of 0 chromic.  Fascia continuous suture of 0 Dexon and the skin was closed with  subcuticular stitch of 4-0 Monocryl.  Blood loss was 600 mL.  The patient  tolerated the procedure well and was taken to recovery room in good  condition.                                               Kathreen Cosier, M.D.    BAM/MEDQ  D:  01/29/2004  T:  01/29/2004  Job:  [id number]

## 2010-11-07 NOTE — H&P (Signed)
[name], Beverly [name]              ACCOUNT NO.:  [id number]   MEDICAL RECORD NO.:  [id number]          PATIENT TYPE:  INP   LOCATION:  1830                         FACILITY:  [facility]   PHYSICIAN:  Georgann Housekeeper, MD      DATE OF BIRTH:  06/01/1981   DATE OF ADMISSION:  11/23/2004  DATE OF DISCHARGE:                                HISTORY & PHYSICAL   CHIEF COMPLAINT:  Vomiting, coughing, and fever.   HISTORY OF PRESENT ILLNESS:  A 30 year old female with history of mild  asthma, depression, and HIV positive came in complaining of this coughing  and nausea.  Started having fevers about 3 or 4 days ago, was seen in the  emergency room and was told that it may be viral.  Continuous to worsen with  cough, yellowish bloody mucous, nauseous and short of breath, and chest  discomfort when she coughs.  Reportedly feeling weak and the chest x-ray  showed infiltrate at this time, elevated white count and also with mild  dehydration with elevated creatinine.  The patient has had headache and  dizziness.  Occasionally take Atrovent for her asthma.   PAST MEDICAL HISTORY:  Mild asthma since childhood, depression, and anemia.  HIV positive a year ago from her child.  She was seen by the ID Clinic, was  treated with drugs during the pregnancy.   ALLERGIES:  No known drug allergies.   MEDICATIONS:  Atrovent occasionally.   PAST SURGICAL HISTORY:  Cesarean section.   SOCIAL HISTORY:  Tobacco positive, alcohol occasional.  No IV drugs.  Lives  with sister in [place].  Family is in [place].  Four children, last one is  a year old.  Does not work.   FAMILY HISTORY:  Mother had lung cancer.  Father is doing okay.   PHYSICAL EXAMINATION:  VITAL SIGNS:  Blood pressure 113/58, pulse 92, with  98% saturations, 103.9 was temperature initially.  GENERAL:  Young, ill-appearing female in no acute distress.  Complaining of  some nausea.  LUNGS:  Decreased breath sounds on the right with occasional  rhonchi and  wheeze.  HEENT:  Pupils reactive, extraocular muscles intact.  HEART:  Regular S1 and S2 without any murmurs.  ABDOMEN:  Soft with good bowel sounds.  Nontender and nondistended.  EXTREMITIES:  No edema, no rashes.   LABORATORY DATA:  White count of 13.6, hemoglobin 9.0, potassium 2.2, BUN  19, creatinine 1.7.   Chest x-ray shows right infiltrate.  Blood cultures x2 was sent.   IMPRESSION:  A 30 year old female with asthma, anemia, HIV positive not  being followed regularly.   Problem 1.  Right lung pneumonia, most likely community acquired.   Problem 2.  Dehydration secondary to nausea and vomiting.   Problem 3.  Hypokalemia secondary to nausea and vomiting.   Problem 4.  Anemia, question chronic.   PLAN:  Admit to hospital.  IV antibiotics with Rocephin, Azithromycin.  IV  fluids.  Potassium replacement.  Check electrolytes and creatinine.  Anemia,  check iron studies and thyroid.  Will also check CD4 count.      KH/MEDQ  D:  11/24/2004  T:  11/24/2004  Job:  [id number]

## 2010-11-07 NOTE — Discharge Summary (Signed)
[name], [name]              ACCOUNT NO.:  [id number]   MEDICAL RECORD NO.:  [id number]          PATIENT TYPE:  INP   LOCATION:  3009                         FACILITY:  [facility]   PHYSICIAN:  Deirdre Peer. Polite, M.D. DATE OF BIRTH:  May 11, 1981   DATE OF ADMISSION:  11/23/2004  DATE OF DISCHARGE:                                 DISCHARGE SUMMARY   DISCHARGE DIAGNOSES:  1.  Right lower lobe pneumonia, strep pneumonia.  2.  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  Please note patient off      antiretroviral greater than one year.  3.  Tobacco use.  4.  Reactive depression.  5.  Anemia secondary to iron deficiency plus or minus component of chronic      disease secondary to human immunodeficiency virus.   DISCHARGE MEDICATIONS:  1.  Avelox 400 mg 1 p.o. daily.  2.  Bactrim DS one three times a week.  3.  Albuterol p.r.n.  4.  Ferrous sulfate 325 mg b.i.d. post completion of Avelox.   DISPOSITION:  The patient is being discharged home in stable condition and  has been seen in consultation by ID.  Plans for followup in the ID clinic on  July 5.  The patient has been seen by case management, as well as social  service and arrangements have been made for the patient to start on  Medicaid.   STUDIES:  CBC at discharge:  White count 9.8, hemoglobin 10.2, platelets  287,000.  CD4 140.  BMET within normal limits except for hypokalemia.  RPR  nonreactive.  Chest x-ray:  Right lower lobe pneumonia.   PROCEDURE:  Status post two units of packed red blood cells.   HISTORY OF PRESENT ILLNESS:  This 30 year old female with known history of  HIV, presented to the ED for evaluation of fever/cough.  Chest x-ray  consistent with pneumonia.  CBC showing leukocytosis.  The patient was  febrile.  Admission was deemed necessary for further evaluation and  treatment.  Please see dictated H&P for further details.   PAST  MEDICAL HISTORY:  As stated above.  Significant for mild asthma,  depression and anemia, HIV positive.   MEDICATIONS ON ADMISSION:  Atrovent p.r.n.   ALLERGIES:  NO KNOWN DRUG ALLERGIES.   PAST SURGICAL HISTORY:  History of C-section in the past.   SOCIAL HISTORY:  Positive tobacco use, occasional alcohol, no IV drugs.   FAMILY HISTORY:  Mother had lung cancer.  Father okay.   HOSPITAL COURSE:  The patient was admitted to the medicine floor for  evaluation and treatment of pneumonia.  The patient was pan cultured,  started on empiric antibiotics.  The patient had slow response to above  treatments.  Continued to have intermittent fever.  The patient's fever  ultimately defervesced, cultures did show streptococcus in the blood.  Past  history of strep pneumonia.  As the patient has known diagnosis of HIV,  further studies were ordered.  CD4 count was 140.  ID consult was called to  assist for getting  the patient back into the ID clinic.  The patient has  been seen by ID.  The patient's antibiotics are going to be changed to oral  Avelox.  She will continue her Bactrim Monday, Wednesday, and Friday for PCP  prophylaxis.  Also as stated, the patient has had anemia, studies consistent  with iron deficiency plus or minus chronic disease.  The patient will be  started on iron post completion of  her Avelox and has received two units of  packed red blood cells during this hospitalization.  Current hemoglobin is  10.2.  At this time, the patient is medically stable for discharge.  Case  manager has been consulted to assist with medications.  The patient is  encouraged to follow up in the ID clinic on the 5th.  Refrain from smoking  and maintain compliance with her antiretrovirals once instituted by the ID  doctors.       RDP/MEDQ  D:  11/27/2004  T:  11/27/2004  Job:  [id number]

## 2010-11-07 NOTE — Discharge Summary (Signed)
Beverly [name], Beverly [name]                        ACCOUNT NO.:  [id number]   MEDICAL RECORD NO.:  [id number]                   PATIENT TYPE:  INP   LOCATION:  9116                                 FACILITY:  [facility]   PHYSICIAN:  Kathreen Cosier, M.D.           DATE OF BIRTH:  02/04/1981   DATE OF ADMISSION:  01/29/2004  DATE OF DISCHARGE:                                 DISCHARGE SUMMARY   HOSPITAL COURSE:  The patient is a 30 year old gravida 5 para 3-0-1-3 with  [facility] February 04, 2004.  The patient was known HIV positive.  Her viral load  prior to admission was undetectable.  She was being treated with Combivir  300 mg one in the a.m. and one in the p.m. and Kaletra 150 mg - she was to  take three in the a.m. and three in the p.m.  She was in for elective C-  section and tubal ligation at term.  The patient underwent primary low  transverse cesarean section.  She had a female, Apgars 9 and 9, weighing 6  pounds 14 ounces.  She also had a tubal ligation performed.  The patient did  well postoperatively.  Her hemoglobin on admission was 9.9, 6.7  postoperatively; she was asymptomatic.  She was discharged on postoperative  day #3 on Tylox for pain and ferrous sulfate one p.o. daily for her anemia.   DISCHARGE DIAGNOSES:  1. Status post intrauterine pregnancy at term.  2. Positive human immunodeficiency virus, being treated.  3. Multiparity, desires sterilization.   PROCEDURES:  She had a primary low transverse cesarean section and tubal  ligation.                                               Kathreen Cosier, M.D.    BAM/MEDQ  D:  02/01/2004  T:  02/01/2004  Job:  [id number]

## 2011-01-22 ENCOUNTER — Other Ambulatory Visit: Payer: Self-pay

## 2011-01-22 ENCOUNTER — Other Ambulatory Visit (INDEPENDENT_AMBULATORY_CARE_PROVIDER_SITE_OTHER): Payer: Self-pay

## 2011-01-22 DIAGNOSIS — Z113 Encounter for screening for infections with a predominantly sexual mode of transmission: Secondary | ICD-10-CM

## 2011-01-22 DIAGNOSIS — B20 Human immunodeficiency virus [HIV] disease: Secondary | ICD-10-CM

## 2011-01-22 DIAGNOSIS — Z79899 Other long term (current) drug therapy: Secondary | ICD-10-CM

## 2011-01-22 DIAGNOSIS — Z119 Encounter for screening for infectious and parasitic diseases, unspecified: Secondary | ICD-10-CM

## 2011-01-23 LAB — CBC WITH DIFFERENTIAL/PLATELET
Basophils Absolute: 0 10*3/uL (ref 0.0–0.1)
Basophils Relative: 1 % (ref 0–1)
Eosinophils Absolute: 0.1 10*3/uL (ref 0.0–0.7)
Eosinophils Relative: 3 % (ref 0–5)
HCT: 30.9 % — ABNORMAL LOW (ref 36.0–46.0)
Hemoglobin: 9.6 g/dL — ABNORMAL LOW (ref 12.0–15.0)
Lymphocytes Relative: 34 % (ref 12–46)
Lymphs Abs: 0.6 10*3/uL — ABNORMAL LOW (ref 0.7–4.0)
MCH: 24.4 pg — ABNORMAL LOW (ref 26.0–34.0)
MCHC: 31.1 g/dL (ref 30.0–36.0)
Monocytes Absolute: 0.2 10*3/uL (ref 0.1–1.0)
Monocytes Relative: 12 % (ref 3–12)
Neutro Abs: 0.9 10*3/uL — ABNORMAL LOW (ref 1.7–7.7)
Neutrophils Relative %: 49 % (ref 43–77)
Platelets: 108 10*3/uL — ABNORMAL LOW (ref 150–400)
RBC: 3.93 MIL/uL (ref 3.87–5.11)
RDW: 16.8 % — ABNORMAL HIGH (ref 11.5–15.5)
WBC: 1.8 10*3/uL — ABNORMAL LOW (ref 4.0–10.5)

## 2011-01-23 LAB — COMPLETE METABOLIC PANEL WITH GFR
ALT: 11 U/L (ref 0–35)
AST: 20 U/L (ref 0–37)
Albumin: 3.2 g/dL — ABNORMAL LOW (ref 3.5–5.2)
Alkaline Phosphatase: 69 U/L (ref 39–117)
BUN: 10 mg/dL (ref 6–23)
CO2: 26 mEq/L (ref 19–32)
Calcium: 8 mg/dL — ABNORMAL LOW (ref 8.4–10.5)
Chloride: 108 mEq/L (ref 96–112)
Creat: 0.62 mg/dL (ref 0.50–1.10)
GFR, Est African American: >60 mL/min (ref >60)
GFR, Est Non African American: >60 mL/min (ref >60)
Potassium: 3.4 mEq/L — ABNORMAL LOW (ref 3.5–5.3)
Sodium: 141 mEq/L (ref 135–145)
Total Bilirubin: 0.6 mg/dL (ref 0.3–1.2)
Total Protein: 7.4 g/dL (ref 6.0–8.3)

## 2011-01-23 LAB — LIPID PANEL
Cholesterol: 120 mg/dL (ref 0–200)
HDL: 44 mg/dL (ref >39)
LDL Cholesterol: 65 mg/dL (ref 0–99)
Total CHOL/HDL Ratio: 2.7 Ratio
Triglycerides: 55 mg/dL (ref <150)
VLDL: 11 mg/dL (ref 0–40)

## 2011-01-23 LAB — T-HELPER CELL (CD4) - (RCID CLINIC ONLY)
CD4 % Helper T Cell: 6 % — ABNORMAL LOW (ref 33–55)
CD4 T Cell Abs: 40 uL — ABNORMAL LOW (ref 400–2700)

## 2011-01-23 LAB — RPR

## 2011-01-23 LAB — GC/CHLAMYDIA PROBE AMP, URINE
Chlamydia, Swab/Urine, PCR: NEGATIVE
GC Probe Amp, Urine: NEGATIVE

## 2011-01-26 LAB — HIV-1 RNA QUANT-NO REFLEX-BLD: HIV 1 RNA Quant: 125000 copies/mL — ABNORMAL HIGH (ref <20)

## 2011-02-06 ENCOUNTER — Ambulatory Visit: Payer: Self-pay | Admitting: Internal Medicine

## 2011-02-19 ENCOUNTER — Encounter: Payer: Self-pay | Admitting: Internal Medicine

## 2011-02-19 ENCOUNTER — Ambulatory Visit (INDEPENDENT_AMBULATORY_CARE_PROVIDER_SITE_OTHER): Payer: Self-pay | Admitting: Internal Medicine

## 2011-02-19 ENCOUNTER — Other Ambulatory Visit: Payer: Self-pay | Admitting: *Deleted

## 2011-02-19 VITALS — BP 142/88 | HR 98 | Temp 98.0°F | Ht 61.0 in | Wt 143.0 lb

## 2011-02-19 DIAGNOSIS — B20 Human immunodeficiency virus [HIV] disease: Secondary | ICD-10-CM

## 2011-02-19 DIAGNOSIS — R87612 Low grade squamous intraepithelial lesion on cytologic smear of cervix (LGSIL): Secondary | ICD-10-CM

## 2011-02-19 DIAGNOSIS — Z23 Encounter for immunization: Secondary | ICD-10-CM

## 2011-02-19 MED ORDER — RITONAVIR 100 MG PO TABS: 100.0000 mg | ORAL_TABLET | Freq: Every day | ORAL | Status: DC

## 2011-02-19 MED ORDER — DARUNAVIR ETHANOLATE 400 MG PO TABS: 800.0000 mg | ORAL_TABLET | Freq: Every day | ORAL | Status: DC

## 2011-02-19 MED ORDER — SULFAMETHOXAZOLE-TRIMETHOPRIM 800-160 MG PO TABS: 1.0000 | ORAL_TABLET | Freq: Two times a day (BID) | ORAL | Status: DC

## 2011-02-19 MED ORDER — EMTRICITABINE-TENOFOVIR DF 200-300 MG PO TABS
1.0000 | ORAL_TABLET | Freq: Every day | ORAL | Status: DC
Start: 2011-02-19 — End: 2011-03-30

## 2011-02-19 MED ORDER — EMTRICITABINE-TENOFOVIR DF 200-300 MG PO TABS: 1.0000 | ORAL_TABLET | Freq: Every day | ORAL | Status: DC

## 2011-02-19 MED ORDER — AZITHROMYCIN 600 MG PO TABS: 1200.0000 mg | ORAL_TABLET | ORAL | Status: AC

## 2011-02-19 NOTE — Progress Notes (Signed)
Addended by: Wendall Mola A on: 02/19/2011 02:48 PM   Modules accepted: Orders

## 2011-02-19 NOTE — Assessment & Plan Note (Signed)
She has been poorly compliant with her treatment in the past however appears motivated to try to improve her health today. I do feel that we'll be very important for her to get help for her mental illness in addition to starting antiretroviral therapy. I have discussed with case manager to assist in getting patient's medications as well as counseling. I will put her on a regimen of boosted Prezista plus Truvada. She will start this regimen as soon asthma medications are available to her. I have discussed the side effects of the medications with her and the need to take her medications daily. I also discussed risk of transmission and need for condom use. She will start on the medications as soon as possible and she will followup with labs one month after she starts the medications. I told her to call if there any problems in the meantime. I also started her on daily Bactrim and weekly azithromycin prophylaxis which she will get at the pharmacy. I discussed the importance of taking his medications as well.

## 2011-02-19 NOTE — Assessment & Plan Note (Signed)
She will need follow up when she has established care.

## 2011-02-19 NOTE — Progress Notes (Signed)
  Subjective:    Patient ID: Beverly [name], female    DOB: 11/04/1980, 30 y.o.   MRN: [id number]  HPI this 30 year old female comes back after an absence from the clinic for about one year. She originally started medications with Atripla which she took for a short time in addition to Kaletra and Combivir which he took during her fourth pregnancy. She then re\re presented in 2010 to restart therapy however had poor followup.  In 2011 she return to clinic and was going to start therapy however did not again returned. At that time her T-cell count was less than 10. Today she comes in again to reestablish care, and during the interview she is tearful. She states that she's had the loss of her father and mother recently and continues to experience significant depression. She also has had a rash on her face that is very noticeable which has made it difficult to conceal her diagnosis to others. Today she states to me that she is motivated to restart therapy though continues to have significant episodes of depression that make continuing therapy difficult. She has had no recent hospitalizations and denies any history of gonorrhea or chlamydia. Of note she does have a history of an abnormal Pap smear.she tells me she has had some weight loss and poor appetite.    Review of Systems a 12 point review of systems was obtained and is negative except as per the history of present illness.     Objective:   Physical Exam  Constitutional: She is oriented to person, place, and time. She appears well-developed. She appears distressed.       tearful  HENT:  Mouth/Throat: No oropharyngeal exudate.       + 2-3 mm lesions on face (>20) c/w molluskum.  Cardiovascular: Normal rate, regular rhythm and normal heart sounds.   Pulmonary/Chest: Effort normal and breath sounds normal. No respiratory distress.  Abdominal: Soft. Bowel sounds are normal. There is no tenderness.  Neurological: She is alert and oriented to person,  place, and time.  Skin: Skin is warm and dry.       No other rashes  Psychiatric:       Tearful, poor eye contact          Assessment & Plan:

## 2011-03-02 ENCOUNTER — Other Ambulatory Visit: Payer: Self-pay | Admitting: *Deleted

## 2011-03-02 DIAGNOSIS — B20 Human immunodeficiency virus [HIV] disease: Secondary | ICD-10-CM

## 2011-03-02 MED ORDER — RITONAVIR 100 MG PO TABS: 100.0000 mg | ORAL_TABLET | Freq: Every day | ORAL | Status: DC

## 2011-03-30 ENCOUNTER — Other Ambulatory Visit: Payer: Self-pay | Admitting: *Deleted

## 2011-03-30 DIAGNOSIS — B20 Human immunodeficiency virus [HIV] disease: Secondary | ICD-10-CM

## 2011-03-30 MED ORDER — SULFAMETHOXAZOLE-TMP DS 800-160 MG PO TABS: 1.0000 | ORAL_TABLET | Freq: Every day | ORAL | Status: DC

## 2011-03-30 MED ORDER — AZITHROMYCIN 600 MG PO TABS: 1200.0000 mg | ORAL_TABLET | ORAL | Status: DC

## 2011-03-30 MED ORDER — DARUNAVIR ETHANOLATE 400 MG PO TABS: 800.0000 mg | ORAL_TABLET | Freq: Every day | ORAL | Status: DC

## 2011-03-30 MED ORDER — EMTRICITABINE-TENOFOVIR DF 200-300 MG PO TABS
1.0000 | ORAL_TABLET | Freq: Every day | ORAL | Status: DC
Start: 2011-03-30 — End: 2013-04-22

## 2011-03-30 NOTE — Telephone Encounter (Signed)
Patient now has ADAP and needed Rx to go to the pharmacy.

## 2011-04-01 ENCOUNTER — Encounter: Payer: Self-pay | Admitting: Internal Medicine

## 2011-04-02 NOTE — Progress Notes (Signed)
  Subjective:    Patient ID: Beverly [name], female    DOB: Jan 21, 1981, 30 y.o.   MRN: [id number]  HPI    Review of Systems     Objective:   Physical Exam        Assessment & Plan:

## 2011-04-02 NOTE — Progress Notes (Signed)
BC transported pt to the pharmacy today to obtain her medications. Pt will begin her ARVs today. BC also assisted pt with scheduling her six week follow up with Dr. Luciana Axe.

## 2011-04-29 ENCOUNTER — Other Ambulatory Visit: Payer: Self-pay

## 2011-05-01 ENCOUNTER — Other Ambulatory Visit: Payer: Self-pay | Admitting: Internal Medicine

## 2011-05-01 ENCOUNTER — Encounter: Payer: Self-pay | Admitting: Internal Medicine

## 2011-05-01 ENCOUNTER — Other Ambulatory Visit (INDEPENDENT_AMBULATORY_CARE_PROVIDER_SITE_OTHER): Payer: Self-pay

## 2011-05-01 ENCOUNTER — Other Ambulatory Visit: Payer: Self-pay | Admitting: *Deleted

## 2011-05-01 DIAGNOSIS — B20 Human immunodeficiency virus [HIV] disease: Secondary | ICD-10-CM

## 2011-05-01 LAB — CBC WITH DIFFERENTIAL/PLATELET
Eosinophils Absolute: 0.1 10*3/uL (ref 0.0–0.7)
Eosinophils Relative: 3 % (ref 0–5)
HCT: 30.7 % — ABNORMAL LOW (ref 36.0–46.0)
Lymphs Abs: 1 10*3/uL (ref 0.7–4.0)
MCH: 24.6 pg — ABNORMAL LOW (ref 26.0–34.0)
MCV: 78.7 fL (ref 78.0–100.0)
Monocytes Absolute: 0.3 10*3/uL (ref 0.1–1.0)
Platelets: 183 10*3/uL (ref 150–400)
RBC: 3.9 MIL/uL (ref 3.87–5.11)

## 2011-05-01 LAB — COMPLETE METABOLIC PANEL WITH GFR
ALT: 9 U/L (ref 0–35)
AST: 19 U/L (ref 0–37)
Albumin: 3.6 g/dL (ref 3.5–5.2)
Calcium: 9.1 mg/dL (ref 8.4–10.5)
Chloride: 106 mEq/L (ref 96–112)
Creat: 0.66 mg/dL (ref 0.50–1.10)
Potassium: 3.9 mEq/L (ref 3.5–5.3)

## 2011-05-01 NOTE — Telephone Encounter (Signed)
I rec'd a faxed request for refill on Azithromycin. I called the number listed for [facility]. [phone]. II spoke with Helmut Muster. 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 Wanted to kno wdate of claim. Told her I did not see it. She checked & pt has another ID number. She states it is covered & she will call the pharmacy to run it again under the new ID #.

## 2011-05-05 LAB — HIV-1 RNA QUANT-NO REFLEX-BLD
HIV 1 RNA Quant: 354 copies/mL — ABNORMAL HIGH (ref <20)
HIV-1 RNA Quant, Log: 2.55 {Log} — ABNORMAL HIGH (ref <1.30)

## 2011-05-07 NOTE — Progress Notes (Signed)
BC transported pt to [facility] for labs today. BC then transported pt to [facility] to obtain refills on her medications. BC will continue to provide treatment adherence with pt to obtain viral suppression.

## 2011-05-12 ENCOUNTER — Ambulatory Visit (HOSPITAL_COMMUNITY)
Admission: RE | Admit: 2011-05-12 | Discharge: 2011-05-12 | Disposition: A | Discharge status: Home / Self Care | Payer: Medicaid Other | Source: Ambulatory Visit | Attending: Internal Medicine | Admitting: Internal Medicine

## 2011-05-12 ENCOUNTER — Telehealth: Payer: Self-pay | Admitting: *Deleted

## 2011-05-12 ENCOUNTER — Ambulatory Visit: Payer: Self-pay | Admitting: Internal Medicine

## 2011-05-12 ENCOUNTER — Other Ambulatory Visit: Payer: Self-pay

## 2011-05-12 ENCOUNTER — Ambulatory Visit (INDEPENDENT_AMBULATORY_CARE_PROVIDER_SITE_OTHER): Payer: Self-pay | Admitting: Internal Medicine

## 2011-05-12 ENCOUNTER — Encounter: Payer: Self-pay | Admitting: Internal Medicine

## 2011-05-12 VITALS — BP 95/61 | HR 144 | Temp 97.9°F | Wt 139.0 lb

## 2011-05-12 DIAGNOSIS — I499 Cardiac arrhythmia, unspecified: Secondary | ICD-10-CM | POA: Insufficient documentation

## 2011-05-12 DIAGNOSIS — R000 Tachycardia, unspecified: Secondary | ICD-10-CM

## 2011-05-12 DIAGNOSIS — F411 Generalized anxiety disorder: Secondary | ICD-10-CM

## 2011-05-12 DIAGNOSIS — R87612 Low grade squamous intraepithelial lesion on cytologic smear of cervix (LGSIL): Secondary | ICD-10-CM

## 2011-05-12 DIAGNOSIS — B20 Human immunodeficiency virus [HIV] disease: Secondary | ICD-10-CM

## 2011-05-12 LAB — TSH: TSH: 1.309 u[IU]/mL (ref 0.350–4.500)

## 2011-05-12 LAB — T4, FREE: Free T4: 0.86 ng/dL (ref 0.80–1.80)

## 2011-05-12 MED ORDER — TRAZODONE HCL 50 MG PO TABS: 50.0000 mg | ORAL_TABLET | Freq: Every day | ORAL | Status: DC

## 2011-05-12 NOTE — Telephone Encounter (Signed)
Needs a pap. I left message asking her to call

## 2011-05-12 NOTE — Assessment & Plan Note (Signed)
The patient does have significant anxiety and difficulty sleeping. I suspect this is an underlying chronic anxiety or possibly bipolar disease. She has been referred to mental health in further evaluation and medications will be deferred to them. She was tachycardic when she arrived here and I did check an EKG but now she is normal sinus rhythm. Her EKG does not have any significant abnormalities or irregular rhythm. I will check a TSH and a free T4 as well to assure she does not have thyroid disease. I suspect though if this is within normal limits that her anxiety and presyncopal episode that she described with palpitations is more related to her anxiety. If she continues to have problems we'll consider a Holter monitor.

## 2011-05-12 NOTE — Progress Notes (Signed)
  Subjective:    Patient ID: Veronia Beets, female    DOB: 04/27/1981, 30 y.o.   MRN: [id number]  HPI this patient is in for routine followup. She was recently started on regimen of boosted Prezista and Truvada and after some delay did start her therapy. She states since starting therapy she had taken everyday however last few days has been out. She is followed by [facility] and is going to the pharmacy to date today to get a refill. Patient otherwise has good tolerance to medications and period. Her complaint today includes anxiety and feelings of palpitations. She denies any syncope. She otherwise has no fever, dysphasia, dysuria or pyuria. She does have feelings of anxiety which has had in the past and is having difficulty sleeping. She states she has difficulty sleeping including sleep initiation and only sleeps about 2 hours. She denies any naps during the day.    Review of Systems  Constitutional: Negative for fever, chills, activity change, appetite change, fatigue and unexpected weight change.  HENT: Negative for sore throat and trouble swallowing.   Respiratory: Negative for cough, shortness of breath and wheezing.   Cardiovascular: Positive for palpitations.  Gastrointestinal: Negative for nausea and diarrhea.  Genitourinary: Negative for dysuria and frequency.  Musculoskeletal: Negative for myalgias and arthralgias.  Skin: Negative for pallor and rash.  Psychiatric/Behavioral: Positive for sleep disturbance. The patient is nervous/anxious.        Objective:   Physical Exam  Constitutional: She is oriented to person, place, and time. She appears well-developed and well-nourished. No distress.  HENT:  Mouth/Throat: Oropharynx is clear and moist. No oropharyngeal exudate.       Face with stable papules  Cardiovascular: Normal rate, regular rhythm and normal heart sounds.   No murmur heard. Pulmonary/Chest: Effort normal and breath sounds normal. No respiratory distress. She has no  wheezes.  Abdominal: Soft. Bowel sounds are normal. There is no tenderness.  Lymphadenopathy:    She has no cervical adenopathy.  Neurological: She is alert and oriented to person, place, and time.  Skin: Skin is warm and dry. Rash noted. No erythema.       face  Psychiatric:       Anxious but stable          Assessment & Plan:

## 2011-05-12 NOTE — Patient Instructions (Signed)
Follow up with [facility] 

## 2011-05-12 NOTE — Assessment & Plan Note (Signed)
The patient is doing good with her new regimen. She is tolerating it well and is not missing any doses other than not having medicine over the weekend. She will begin the pharmacy to get refills of her medicine today and will restart again. I did encourage to continue compliance and not missing any doses. She is able to call someone if she is out of medicines or worried about running out. I did discuss with her condom use. I also did talk to her about long-term effects of HIV including cardiac disease and the importance of exercise and healthy eating. Patient will return in 2 months for followup labs and to ensure that she continues to do well.

## 2011-05-12 NOTE — Assessment & Plan Note (Signed)
She does get follow up with [facility] and will do further management to them.

## 2011-05-13 ENCOUNTER — Ambulatory Visit: Payer: Self-pay | Admitting: Internal Medicine

## 2011-06-05 ENCOUNTER — Ambulatory Visit: Payer: Self-pay

## 2011-06-12 ENCOUNTER — Telehealth: Payer: Self-pay | Admitting: *Deleted

## 2011-06-12 ENCOUNTER — Ambulatory Visit: Payer: Self-pay

## 2011-06-12 DIAGNOSIS — Z0000 Encounter for general adult medical examination without abnormal findings: Secondary | ICD-10-CM | POA: Insufficient documentation

## 2011-06-12 NOTE — Telephone Encounter (Signed)
Message left for pt to call to reschedule pap smear appt.

## 2011-06-15 ENCOUNTER — Encounter: Payer: Self-pay | Admitting: *Deleted

## 2011-06-24 ENCOUNTER — Other Ambulatory Visit: Payer: Self-pay

## 2011-07-07 ENCOUNTER — Other Ambulatory Visit: Payer: Self-pay

## 2011-07-07 ENCOUNTER — Telehealth: Payer: Self-pay | Admitting: *Deleted

## 2011-07-07 NOTE — Telephone Encounter (Signed)
Called patient to reschedule lab appointment and no working phone number.  She has rescheduled her office visit and if she no shows that a [facility] referral will be made. Wendall Mola CMA

## 2011-07-08 ENCOUNTER — Other Ambulatory Visit: Payer: Self-pay | Admitting: *Deleted

## 2011-07-08 ENCOUNTER — Other Ambulatory Visit: Payer: Self-pay | Admitting: Infectious Diseases

## 2011-07-08 ENCOUNTER — Other Ambulatory Visit (INDEPENDENT_AMBULATORY_CARE_PROVIDER_SITE_OTHER): Payer: Medicaid Other

## 2011-07-08 DIAGNOSIS — B20 Human immunodeficiency virus [HIV] disease: Secondary | ICD-10-CM

## 2011-07-08 LAB — CBC WITH DIFFERENTIAL/PLATELET
Basophils Absolute: 0 10*3/uL (ref 0.0–0.1)
Basophils Relative: 0 % (ref 0–1)
Eosinophils Absolute: 0.1 10*3/uL (ref 0.0–0.7)
MCH: 24.3 pg — ABNORMAL LOW (ref 26.0–34.0)
MCHC: 31.1 g/dL (ref 30.0–36.0)
Monocytes Relative: 8 % (ref 3–12)
Neutro Abs: 3.4 10*3/uL (ref 1.7–7.7)
Neutrophils Relative %: 68 % (ref 43–77)
Platelets: 222 10*3/uL (ref 150–400)
RDW: 16.7 % — ABNORMAL HIGH (ref 11.5–15.5)

## 2011-07-08 LAB — COMPLETE METABOLIC PANEL WITH GFR
ALT: 9 U/L (ref 0–35)
Albumin: 3.8 g/dL (ref 3.5–5.2)
CO2: 22 mEq/L (ref 19–32)
Calcium: 9.1 mg/dL (ref 8.4–10.5)
Chloride: 102 mEq/L (ref 96–112)
GFR, Est African American: >89 mL/min
GFR, Est Non African American: >89 mL/min
Glucose, Bld: 82 mg/dL (ref 70–99)
Sodium: 136 mEq/L (ref 135–145)
Total Bilirubin: 0.7 mg/dL (ref 0.3–1.2)
Total Protein: 8.1 g/dL (ref 6.0–8.3)

## 2011-07-08 MED ORDER — RITONAVIR 100 MG PO TABS: 100.0000 mg | ORAL_TABLET | Freq: Every day | ORAL | Status: DC

## 2011-07-08 MED ORDER — DARUNAVIR ETHANOLATE 800 MG PO TABS: 1.0000 | ORAL_TABLET | Freq: Every day | ORAL | Status: DC

## 2011-07-09 ENCOUNTER — Ambulatory Visit: Payer: Self-pay | Admitting: Internal Medicine

## 2011-07-09 LAB — T-HELPER CELL (CD4) - (RCID CLINIC ONLY): CD4 % Helper T Cell: 6 % — ABNORMAL LOW (ref 33–55)

## 2011-07-10 LAB — HIV-1 RNA QUANT-NO REFLEX-BLD: HIV-1 RNA Quant, Log: <1.3 {Log} (ref <1.30)

## 2011-07-21 ENCOUNTER — Encounter: Payer: Self-pay | Admitting: Internal Medicine

## 2011-07-21 ENCOUNTER — Ambulatory Visit (INDEPENDENT_AMBULATORY_CARE_PROVIDER_SITE_OTHER): Payer: Medicaid Other | Admitting: Internal Medicine

## 2011-07-21 ENCOUNTER — Telehealth: Payer: Self-pay | Admitting: *Deleted

## 2011-07-21 VITALS — BP 112/79 | HR 123 | Temp 98.2°F | Ht 61.0 in | Wt 137.0 lb

## 2011-07-21 DIAGNOSIS — F411 Generalized anxiety disorder: Secondary | ICD-10-CM

## 2011-07-21 DIAGNOSIS — B20 Human immunodeficiency virus [HIV] disease: Secondary | ICD-10-CM

## 2011-07-21 NOTE — Patient Instructions (Signed)
Keep it up!

## 2011-07-21 NOTE — Assessment & Plan Note (Signed)
She does report depression at this time and will enter into counseling as arranged by [facility].

## 2011-07-21 NOTE — Progress Notes (Signed)
  Subjective:    Patient ID: Beverly [name], female    DOB: [birth year], 31 y.o.   MRN: [id number]  HPI she comes in for followup of her HIV. She has been started on regimen of boosted Prezista and Truvada and continues to have good tolerance. At her last visit, she unfortunately had been out of medications for several days but did again restart the medication. Since that time, she does report excellent compliance and tolerance of the medications. She has had no complaints other than the persistent rash on her face. She does could go complain of depression that has worsened since her last visit though denies any suicidal ideation or other weight loss. She continues to take Bactrim as well. She has not had a recent Pap smear.    Review of Systems  Constitutional: Negative for fever, chills, fatigue and unexpected weight change.  HENT: Negative for sore throat and trouble swallowing.   Respiratory: Negative for cough and shortness of breath.   Cardiovascular: Negative for chest pain, palpitations and leg swelling.  Gastrointestinal: Negative for nausea, abdominal pain and diarrhea.  Genitourinary: Negative for hematuria, genital sores, menstrual problem and pelvic pain.  Musculoskeletal: Negative for myalgias and arthralgias.  Skin: Positive for rash.  Neurological: Negative for light-headedness and headaches.  Hematological: Negative for adenopathy.  Psychiatric/Behavioral: Positive for dysphoric mood. Negative for sleep disturbance. The patient is not nervous/anxious.        Objective:   Physical Exam  Constitutional: She appears well-developed and well-nourished. No distress.  HENT:  Mouth/Throat: Oropharynx is clear and moist. No oropharyngeal exudate.  Cardiovascular: Normal rate, regular rhythm and normal heart sounds.  Exam reveals no gallop and no friction rub.   No murmur heard. Pulmonary/Chest: Breath sounds normal. No respiratory distress. She has no wheezes. She has no rales.    Abdominal: Soft. Bowel sounds are normal. She exhibits no distension. There is no tenderness. There is no rebound.  Lymphadenopathy:    She has no cervical adenopathy.  Skin:       + multiple facial lesions consistent with molluscum  Psychiatric:       Flat affect          Assessment & Plan:

## 2011-07-21 NOTE — Assessment & Plan Note (Signed)
Despite previous missed doses, she now has undetectable virus. Her CD4 count though has not increased from his previous level of 60 but I suspect this is secondary to having been out of medicines and a decline from previous. She will be followed closely low and have her repeat her labs in about 6 weeks. I did remind her of the need to use condoms with all sexual activity.

## 2011-07-21 NOTE — Telephone Encounter (Signed)
Referral sent to [facility] for pap smear, patient had previous abnormal pap. Wendall Mola CMA

## 2011-08-12 ENCOUNTER — Telehealth: Payer: Self-pay | Admitting: *Deleted

## 2011-08-12 NOTE — Telephone Encounter (Signed)
Pt did not need both appts.  Canceled [facility] appt.  Pt will go to [facility] on 08/31/11.  Message left for pt explaining the reason for cancellation.

## 2011-08-28 ENCOUNTER — Ambulatory Visit: Payer: Medicaid Other

## 2011-08-31 ENCOUNTER — Encounter: Payer: Medicaid Other | Admitting: Physician Assistant

## 2011-08-31 ENCOUNTER — Telehealth: Payer: Self-pay | Admitting: *Deleted

## 2011-08-31 NOTE — Telephone Encounter (Signed)
We rec'd a fax from [facility] that she did not keep her appt today. Informed D. Estridge RN who states she already knew that

## 2011-09-03 ENCOUNTER — Other Ambulatory Visit: Payer: Medicaid Other

## 2011-09-04 ENCOUNTER — Other Ambulatory Visit: Payer: Medicaid Other

## 2011-09-17 ENCOUNTER — Ambulatory Visit: Payer: Medicaid Other | Admitting: Internal Medicine

## 2011-09-17 ENCOUNTER — Telehealth: Payer: Self-pay | Admitting: Licensed Clinical Social Worker

## 2011-09-17 NOTE — Telephone Encounter (Signed)
Called and left message for patient to reschedule her missed appointment with Dr. Luciana Axe.

## 2011-10-28 ENCOUNTER — Encounter: Payer: Self-pay | Admitting: *Deleted

## 2011-10-28 ENCOUNTER — Telehealth: Payer: Self-pay | Admitting: *Deleted

## 2011-10-28 NOTE — Telephone Encounter (Signed)
Left message for pt to call [facility] to make new appts for lab work and Dr. Luciana Axe.  Requested pt call [id number] for appt to f/u on abnl PAP smear.  Will mail this information to pt as well.

## 2011-11-24 ENCOUNTER — Encounter: Payer: Self-pay | Admitting: *Deleted

## 2011-11-24 NOTE — Progress Notes (Signed)
Patient ID: Beverly [name], female   DOB: June 19, 1981, 31 y.o.   MRN: [id number] Pt no shows and cancelled appts for MD, lab, and GYN f/u.  Referred to [facility].

## 2012-01-26 ENCOUNTER — Telehealth: Payer: Self-pay | Admitting: Infectious Diseases

## 2012-01-26 NOTE — Telephone Encounter (Signed)
Called Ms. Tye to set up an appointment for her patient assistance.  She said she would try to come in on Tuesday, Aug 13 around 10:00am.  I told her she needed to make an appointment to see the doctor also.  She started talking with someone else and never responded to me again.  I hung up.  I tried to call back.  The line was busy.

## 2013-04-03 ENCOUNTER — Encounter (HOSPITAL_COMMUNITY): Payer: Self-pay | Admitting: Emergency Medicine

## 2013-04-03 ENCOUNTER — Emergency Department (HOSPITAL_COMMUNITY)
Admission: EM | Admit: 2013-04-03 | Discharge: 2013-04-03 | Disposition: A | Discharge status: Home / Self Care | Payer: Medicaid Other | Attending: Emergency Medicine | Admitting: Emergency Medicine

## 2013-04-03 DIAGNOSIS — Z79899 Other long term (current) drug therapy: Secondary | ICD-10-CM | POA: Insufficient documentation

## 2013-04-03 DIAGNOSIS — M25539 Pain in unspecified wrist: Secondary | ICD-10-CM | POA: Insufficient documentation

## 2013-04-03 DIAGNOSIS — R209 Unspecified disturbances of skin sensation: Secondary | ICD-10-CM | POA: Insufficient documentation

## 2013-04-03 DIAGNOSIS — R202 Paresthesia of skin: Secondary | ICD-10-CM

## 2013-04-03 DIAGNOSIS — M2569 Stiffness of other specified joint, not elsewhere classified: Secondary | ICD-10-CM | POA: Insufficient documentation

## 2013-04-03 DIAGNOSIS — R509 Fever, unspecified: Secondary | ICD-10-CM | POA: Insufficient documentation

## 2013-04-03 DIAGNOSIS — F411 Generalized anxiety disorder: Secondary | ICD-10-CM | POA: Insufficient documentation

## 2013-04-03 DIAGNOSIS — Z21 Asymptomatic human immunodeficiency virus [HIV] infection status: Secondary | ICD-10-CM | POA: Insufficient documentation

## 2013-04-03 DIAGNOSIS — F419 Anxiety disorder, unspecified: Secondary | ICD-10-CM

## 2013-04-03 DIAGNOSIS — F17200 Nicotine dependence, unspecified, uncomplicated: Secondary | ICD-10-CM | POA: Insufficient documentation

## 2013-04-03 LAB — POCT I-STAT, CHEM 8
Calcium, Ion: 1.18 mmol/L (ref 1.12–1.23)
Creatinine, Ser: 0.8 mg/dL (ref 0.50–1.10)
HCT: 40 % (ref 36.0–46.0)
Hemoglobin: 13.6 g/dL (ref 12.0–15.0)
TCO2: 25 mmol/L (ref 0–100)

## 2013-04-03 LAB — CALCIUM: Calcium: 9.1 mg/dL (ref 8.4–10.5)

## 2013-04-03 MED ORDER — HYDROXYZINE HCL 25 MG PO TABS: 25.0000 mg | ORAL_TABLET | Freq: Four times a day (QID) | ORAL | Status: DC

## 2013-04-03 MED ORDER — POTASSIUM CHLORIDE CRYS ER 20 MEQ PO TBCR
40.0000 meq | EXTENDED_RELEASE_TABLET | Freq: Once | ORAL | Status: AC
  Administered 2013-04-03: 40 meq via ORAL
  Filled 2013-04-03: qty 2
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
## 2013-04-03 NOTE — ED Notes (Addendum)
Pt states she has been experiencing sharp tingling pain, tremors and numbness in her fingers, face and toes for the past 2 months. Pt feels the numbness and tingling has gotten worse in the past week. Pt feels like she has no control of her hands and face occasionally, stating that her hand will remain contracted for 20 minutes at times. She states she has also been under more stress recently and "feels like she can't relax", with increased muscle tension in her neck and shoulders for the past 2 days.

## 2013-04-04 NOTE — ED Provider Notes (Signed)
Medical screening examination/treatment/procedure(s) were conducted as a shared visit with non-physician practitioner(s) and myself.  I personally evaluated the patient during the encounter  Face to face encounter:  Describes paresthesias to all four extremities. Glove/stocking distribution. Intermittent, but worsening.  Exam shows no neurological findings.  Discussed anxiety.  Definitely some anxiety, but uncertain if primary or secondary.  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   No acute needs for condition outside of MSE today.  Roney Marion, MD 04/04/13 [phone]

## 2013-04-18 ENCOUNTER — Encounter (HOSPITAL_COMMUNITY): Payer: Self-pay | Admitting: Emergency Medicine

## 2013-04-18 ENCOUNTER — Emergency Department (HOSPITAL_COMMUNITY)
Admission: EM | Admit: 2013-04-18 | Discharge: 2013-04-18 | Disposition: A | Discharge status: Home / Self Care | Payer: Medicaid Other | Attending: Emergency Medicine | Admitting: Emergency Medicine

## 2013-04-18 DIAGNOSIS — L0291 Cutaneous abscess, unspecified: Secondary | ICD-10-CM

## 2013-04-18 DIAGNOSIS — F3289 Other specified depressive episodes: Secondary | ICD-10-CM | POA: Insufficient documentation

## 2013-04-18 DIAGNOSIS — Z21 Asymptomatic human immunodeficiency virus [HIV] infection status: Secondary | ICD-10-CM | POA: Insufficient documentation

## 2013-04-18 DIAGNOSIS — B20 Human immunodeficiency virus [HIV] disease: Secondary | ICD-10-CM

## 2013-04-18 DIAGNOSIS — IMO0002 Reserved for concepts with insufficient information to code with codable children: Secondary | ICD-10-CM | POA: Insufficient documentation

## 2013-04-18 DIAGNOSIS — F411 Generalized anxiety disorder: Secondary | ICD-10-CM | POA: Insufficient documentation

## 2013-04-18 DIAGNOSIS — R6883 Chills (without fever): Secondary | ICD-10-CM | POA: Insufficient documentation

## 2013-04-18 DIAGNOSIS — F17200 Nicotine dependence, unspecified, uncomplicated: Secondary | ICD-10-CM | POA: Insufficient documentation

## 2013-04-18 DIAGNOSIS — F329 Major depressive disorder, single episode, unspecified: Secondary | ICD-10-CM | POA: Insufficient documentation

## 2013-04-18 DIAGNOSIS — Z79899 Other long term (current) drug therapy: Secondary | ICD-10-CM | POA: Insufficient documentation

## 2013-04-18 MED ORDER — ACETAMINOPHEN 325 MG PO TABS
650.0000 mg | ORAL_TABLET | Freq: Once | ORAL | Status: AC
  Administered 2013-04-18: 650 mg via ORAL
  Filled 2013-04-18: qty 2

## 2013-04-18 MED ORDER — CLINDAMYCIN HCL 150 MG PO CAPS: 450.0000 mg | ORAL_CAPSULE | Freq: Three times a day (TID) | ORAL | Status: DC

## 2013-04-18 MED ORDER — ACETAMINOPHEN ER 650 MG PO TBCR: 650.0000 mg | EXTENDED_RELEASE_TABLET | Freq: Three times a day (TID) | ORAL | Status: DC | PRN

## 2013-04-18 NOTE — ED Provider Notes (Signed)
CSN: [id number]     Arrival date & time 04/18/13  0013 History   First MD Initiated Contact with Patient 04/18/13 0032     Chief Complaint  Patient presents with  . Abscess   (Consider location/radiation/quality/duration/timing/severity/associated sxs/prior Treatment) The history is provided by the patient. No language interpreter was used.  Beverly [name] is a 32 y/o F with PMHx of anxiety, depression, HIV infection that is currently turning into AIDs with last documented CD4 count in January 2013 being 60 presenting to the emergency department with abscess to the left axilla that has been ongoing for the past 2 days. Patient reported that the abscess was small the beginning has progressively: Monitor. Patient reports that she has extreme tenderness to the left axilla, pain upon palpation. Patient denied active drainage. Patient reported that she took a hot shower yesterday that lead to the abscess to feel softer. Patient reported that she has not been taking her medications for HIV due to her not feeling good after taking the - patient reported that she is due to see Infectious Disease and start a new medication regimen. Denied fever, chills, neck stiffness, chest pain, shortness of breath, difficulty breathing, loss of sensation PCP none  Past Medical History  Diagnosis Date  . Anxiety   . Depression   . HIV infection    Past Surgical History  Procedure Laterality Date  . Cesarean section     Family History  Problem Relation Age of Onset  . Hypertension Other   . Cancer Other   . Diabetes Other   . Asthma Other    History  Substance Use Topics  . Smoking status: Current Every Day Smoker -- 0.30 packs/day for 3 years    Types: Cigarettes  . Smokeless tobacco: Never Used  . Alcohol Use: Yes     Comment: occasional   OB History   Grav Para Term Preterm Abortions TAB SAB Ect Mult Living                 Review of Systems  Constitutional: Positive for chills. Negative for  fever.  Respiratory: Negative for chest tightness and shortness of breath.   Cardiovascular: Negative for chest pain.  Musculoskeletal: Negative for neck stiffness.  Skin: Positive for wound (abscess).  Neurological: Negative for weakness.  All other systems reviewed and are negative.    Allergies  Morphine and related  Home Medications   Current Outpatient Rx  Name  Route  Sig  Dispense  Refill  . Darunavir Ethanolate (PREZISTA) 800 MG TABS   Oral   Take 1 tablet by mouth daily.   30 tablet   11   . emtricitabine-tenofovir (TRUVADA) 200-300 MG per tablet   Oral   Take 1 tablet by mouth daily.   30 tablet   11   . hydrOXYzine (ATARAX/VISTARIL) 25 MG tablet   Oral   Take 1 tablet (25 mg total) by mouth every 6 (six) hours.   12 tablet   0   . ibuprofen (ADVIL,MOTRIN) 200 MG tablet   Oral   Take 800 mg by mouth every 6 (six) hours as needed for pain (pain).         . ritonavir (NORVIR) 100 MG TABS   Oral   Take 1 tablet (100 mg total) by mouth daily with breakfast.   30 tablet   11   . acetaminophen (TYLENOL 8 HOUR) 650 MG CR tablet   Oral   Take 1 tablet (650 mg total)  by mouth every 8 (eight) hours as needed for pain.   30 tablet   0   . clindamycin (CLEOCIN) 150 MG capsule   Oral   Take 3 capsules (450 mg total) by mouth 3 (three) times daily.   90 capsule   0    BP 119/73  Pulse 86  Temp(Src) 98.5 F (36.9 C) (Oral)  Resp 14  SpO2 100%  LMP 03/22/2013 Physical Exam  Nursing note and vitals reviewed. Constitutional: She is oriented to person, place, and time. She appears well-developed and well-nourished. No distress.  HENT:  Head: Normocephalic and atraumatic.  Neck: Normal range of motion. Neck supple.  Cardiovascular: Normal rate, regular rhythm and normal heart sounds.  Exam reveals no friction rub.   No murmur heard. Pulmonary/Chest: Effort normal and breath sounds normal. No respiratory distress. She has no wheezes. She has no rales.   Musculoskeletal: Normal range of motion.  Neurological: She is alert and oriented to person, place, and time.  Strength intact to left upper extremity   Skin: Skin is warm. She is not diaphoretic.  Approximately 3 cm x 3.5 cm fluctuant abscess to left axilla, pain upon palpation, surround erythema noted. Negative active drainage. Negative streaking noted.   Psychiatric: She has a normal mood and affect. Her behavior is normal. Thought content normal.    ED Course  Procedures (including critical care time)  INCISION AND DRAINAGE Performed by: Raymon Mutton Consent: Verbal consent obtained. Risks and benefits: risks, benefits and alternatives were discussed Type: abscess Body area: left axilla Anesthesia: local infiltration Incision was made with a scalpel. Local anesthetic: lidocaine 2% without epinephrine Anesthetic total: 2 ml Complexity: complex Blunt dissection to break up loculations Drainage: purulent Drainage amount: 5-10 cc Packing material: 1 in iodoform gauze Patient tolerance: Patient tolerated the procedure well with no immediate complications.  Labs Review Labs Reviewed  WOUND CULTURE   Imaging Review No results found.  EKG Interpretation   None       MDM   1. Abscess   2. HIV (human immunodeficiency virus infection)    Patient presenting to emergency department with abscess left axilla does been ongoing for the past 2 days. Patient is HIV positive, last known documentation of CD4 count was in January 2013 with a CD4 count of 60 - AIDS qualification. Patient reported that she has not been taking her medications over the past 3 months secondary to not feeling well. Patient reports she's to follow with infectious disease to discuss new treatment regimen. Alert and oriented. Full range of motion to left upper remedy. Strength intact. Approximately 3 cm x 3.5 cm fluctuant abscess to the left axilla, discomfort upon palpation, mild erythema surrounding  erythema identified. Negative active drainage. Negative streaking. I&D performed with purulent fluid drained approximately 5-10 cc. Packing placed. Patient tolerated procedure well. Patient stable, afebrile. Discharged patient with antibiotics. Discussed with patient proper hygiene and wound care after abscess drained. Discussed with patient to return back to the ED in 3 days for the packing to be removed and for the abscess to be re-assessed. Discussed with patient to apply hot compressions. Discussed with patient to closely monitor symptoms and if symptoms are to worsen or change to report back to emergency department-strict return instructions given. Patient agreed to plan of care, understood, all questions answered.    Raymon Mutton, PA-C 04/18/13 1443

## 2013-04-18 NOTE — ED Notes (Signed)
Pt states she has an abscess under her left arm  Pt states it started a couple of days ago and it has progressively gotten bigger  Pt states it is very painful

## 2013-04-19 NOTE — ED Provider Notes (Signed)
Medical screening examination/treatment/procedure(s) were performed by non-physician practitioner and as supervising physician I was immediately available for consultation/collaboration.  EKG Interpretation   None         Kraig Genis M Ian Castagna, MD 04/19/13 0024 

## 2013-04-22 ENCOUNTER — Encounter (HOSPITAL_COMMUNITY): Payer: Self-pay | Admitting: Emergency Medicine

## 2013-04-22 ENCOUNTER — Emergency Department (HOSPITAL_COMMUNITY)
Admission: EM | Admit: 2013-04-22 | Discharge: 2013-04-22 | Disposition: A | Discharge status: Home / Self Care | Payer: Medicaid Other | Attending: Emergency Medicine | Admitting: Emergency Medicine

## 2013-04-22 DIAGNOSIS — Z21 Asymptomatic human immunodeficiency virus [HIV] infection status: Secondary | ICD-10-CM | POA: Insufficient documentation

## 2013-04-22 DIAGNOSIS — R112 Nausea with vomiting, unspecified: Secondary | ICD-10-CM | POA: Insufficient documentation

## 2013-04-22 DIAGNOSIS — R509 Fever, unspecified: Secondary | ICD-10-CM | POA: Insufficient documentation

## 2013-04-22 DIAGNOSIS — L02419 Cutaneous abscess of limb, unspecified: Secondary | ICD-10-CM

## 2013-04-22 DIAGNOSIS — F17200 Nicotine dependence, unspecified, uncomplicated: Secondary | ICD-10-CM | POA: Insufficient documentation

## 2013-04-22 DIAGNOSIS — Z8659 Personal history of other mental and behavioral disorders: Secondary | ICD-10-CM | POA: Insufficient documentation

## 2013-04-22 DIAGNOSIS — Z79899 Other long term (current) drug therapy: Secondary | ICD-10-CM | POA: Insufficient documentation

## 2013-04-22 DIAGNOSIS — Z4800 Encounter for change or removal of nonsurgical wound dressing: Secondary | ICD-10-CM | POA: Insufficient documentation

## 2013-04-22 LAB — WOUND CULTURE

## 2013-04-22 MED ORDER — ONDANSETRON 4 MG PO TBDP
4.0000 mg | ORAL_TABLET | Freq: Once | ORAL | Status: AC
  Administered 2013-04-22: 4 mg via ORAL
  Filled 2013-04-22: qty 1

## 2013-04-22 MED ORDER — HYDROCODONE-ACETAMINOPHEN 5-325 MG PO TABS
1.0000 | ORAL_TABLET | Freq: Once | ORAL | Status: AC
  Administered 2013-04-22: 1 via ORAL
  Filled 2013-04-22: qty 1

## 2013-04-22 MED ORDER — SULFAMETHOXAZOLE-TRIMETHOPRIM 800-160 MG PO TABS: 1.0000 | ORAL_TABLET | Freq: Two times a day (BID) | ORAL | Status: DC

## 2013-04-22 NOTE — ED Notes (Signed)
Pt reports increased pain and fever around the abscess and packing in l/axilla. Pt was not able to afford her antibiotic, and did not start it.

## 2013-04-22 NOTE — ED Provider Notes (Signed)
CSN: [id number]     Arrival date & time 04/22/13  1606 History   First MD Initiated Contact with Patient 04/22/13 1637     Chief Complaint  Patient presents with  . Wound Check    removal of packing - post I and D of abscess under l/arm   (Consider location/radiation/quality/duration/timing/severity/associated sxs/prior Treatment) HPI Comments: The patient is a 32 year-old female with a past medical history of HIV, presenting the Emergency Department with a chief complaint of wound check of abscess drainage to the left axilla.  Reports fever yestreday and nausea and 2 episodes of vomiting since yesterday.  Non compliance with clindamycin due to financial reasons.  Reports fever 102 yesterday but afebrile today without antipyretics. She reports two small tender areas to er right axilla as well for 2 days.   The history is provided by the patient.    Past Medical History  Diagnosis Date  . Anxiety   . Depression   . HIV infection    Past Surgical History  Procedure Laterality Date  . Cesarean section     Family History  Problem Relation Age of Onset  . Hypertension Other   . Cancer Other   . Diabetes Other   . Asthma Other    History  Substance Use Topics  . Smoking status: Current Every Day Smoker -- 0.30 packs/day for 3 years    Types: Cigarettes  . Smokeless tobacco: Never Used  . Alcohol Use: Yes     Comment: occasional   OB History   Grav Para Term Preterm Abortions TAB SAB Ect Mult Living                 Review of Systems  All other systems reviewed and are negative.    Allergies  Morphine and related  Home Medications   Current Outpatient Rx  Name  Route  Sig  Dispense  Refill  . acetaminophen (TYLENOL 8 HOUR) 650 MG CR tablet   Oral   Take 1 tablet (650 mg total) by mouth every 8 (eight) hours as needed for pain.   30 tablet   0   . ibuprofen (ADVIL,MOTRIN) 200 MG tablet   Oral   Take 800 mg by mouth every 6 (six) hours as needed for pain  (pain).         . Darunavir Ethanolate (PREZISTA) 800 MG TABS   Oral   Take 1 tablet by mouth daily.   30 tablet   11   . emtricitabine-tenofovir (TRUVADA) 200-300 MG per tablet   Oral   Take 1 tablet by mouth daily.         . ritonavir (NORVIR) 100 MG TABS   Oral   Take 1 tablet (100 mg total) by mouth daily with breakfast.   30 tablet   11   . sulfamethoxazole-trimethoprim (SEPTRA DS) 800-160 MG per tablet   Oral   Take 1 tablet by mouth 2 (two) times daily.   20 tablet   0    BP 98/70  Pulse 90  Temp(Src) 98.5 F (36.9 C) (Oral)  Resp 18  Ht 5\' 1"  (1.549 m)  Wt 145 lb (65.772 kg)  BMI 27.41 kg/m2  SpO2 100%  LMP 03/22/2013 Physical Exam  Vitals reviewed. Constitutional: She appears well-developed and well-nourished.  HENT:  Head: Normocephalic and atraumatic.  Neck: Neck supple.  Cardiovascular: Normal rate.   Pulmonary/Chest: Effort normal. No respiratory distress.  Abdominal: Soft.  Neurological: She is alert.  Skin: Skin  is warm and dry.  Right Axilla-1 cm linear open wound with packing.  Surrounding induration no fluctuance, no overlying erythema.  Left Axilla- two small lesions to inferior axilla, tender to palpation, no erythema, no fluctuance.     ED Course  Procedures (including critical care time) Labs Review Labs Reviewed - No data to display Imaging Review No results found.  EKG Interpretation   None       MDM   1. Abscess of axilla- recheck    Patient with an I&D 04/18/2013 presents for a wound recheck. Minimal crusting to the right axilla. No erythremia on exam. Per the EMR: Patient is HIV positive, last known documentation of CD4 count was in January 2013 with a CD4 count of 60 - AIDS qualification.  Further questioning reveals that the patient has had Norco and percocet without hive.  Will give zofran and norco.  Will discharge the patient on Septra.  Discussed the importance of filling antibiotic with the patient, given her  immune status.  She states she understands and agrees on treatment plan.   Meds given in ED:  Medications  ondansetron (ZOFRAN-ODT) disintegrating tablet 4 mg (4 mg Oral Given 04/22/13 1720)  HYDROcodone-acetaminophen (NORCO/VICODIN) 5-325 MG per tablet 1 tablet (1 tablet Oral Given 04/22/13 1720)    Discharge Medication List as of 04/22/2013  6:12 PM    START taking these medications   Details  sulfamethoxazole-trimethoprim (SEPTRA DS) 800-160 MG per tablet Take 1 tablet by mouth 2 (two) times daily., Starting 04/22/2013, Until Discontinued, Print          Clabe Seal, PA-C 04/22/13 1836

## 2013-04-27 NOTE — ED Provider Notes (Signed)
Medical screening examination/treatment/procedure(s) were performed by non-physician practitioner and as supervising physician I was immediately available for consultation/collaboration.  EKG Interpretation   None        Billiejo Sorto, MD 04/27/13 0406 

## 2013-10-16 ENCOUNTER — Inpatient Hospital Stay (HOSPITAL_COMMUNITY)
Admission: EM | Admit: 2013-10-16 | Discharge: 2013-10-19 | DRG: 974 | Disposition: A | Discharge status: Home / Self Care | Payer: Medicaid Other | Attending: Internal Medicine | Admitting: Internal Medicine

## 2013-10-16 ENCOUNTER — Encounter (HOSPITAL_COMMUNITY): Payer: Self-pay | Admitting: Emergency Medicine

## 2013-10-16 ENCOUNTER — Emergency Department (HOSPITAL_COMMUNITY): Payer: Medicaid Other

## 2013-10-16 DIAGNOSIS — Z9114 Patient's other noncompliance with medication regimen: Secondary | ICD-10-CM

## 2013-10-16 DIAGNOSIS — D638 Anemia in other chronic diseases classified elsewhere: Secondary | ICD-10-CM | POA: Diagnosis present

## 2013-10-16 DIAGNOSIS — F17210 Nicotine dependence, cigarettes, uncomplicated: Secondary | ICD-10-CM | POA: Diagnosis present

## 2013-10-16 DIAGNOSIS — E876 Hypokalemia: Secondary | ICD-10-CM | POA: Diagnosis not present

## 2013-10-16 DIAGNOSIS — A498 Other bacterial infections of unspecified site: Secondary | ICD-10-CM | POA: Diagnosis present

## 2013-10-16 DIAGNOSIS — N12 Tubulo-interstitial nephritis, not specified as acute or chronic: Secondary | ICD-10-CM | POA: Diagnosis present

## 2013-10-16 DIAGNOSIS — J40 Bronchitis, not specified as acute or chronic: Secondary | ICD-10-CM | POA: Diagnosis present

## 2013-10-16 DIAGNOSIS — Z21 Asymptomatic human immunodeficiency virus [HIV] infection status: Secondary | ICD-10-CM

## 2013-10-16 DIAGNOSIS — R6510 Systemic inflammatory response syndrome (SIRS) of non-infectious origin without acute organ dysfunction: Secondary | ICD-10-CM | POA: Diagnosis present

## 2013-10-16 DIAGNOSIS — F3289 Other specified depressive episodes: Secondary | ICD-10-CM | POA: Diagnosis present

## 2013-10-16 DIAGNOSIS — B20 Human immunodeficiency virus [HIV] disease: Secondary | ICD-10-CM | POA: Diagnosis present

## 2013-10-16 DIAGNOSIS — F419 Anxiety disorder, unspecified: Secondary | ICD-10-CM | POA: Diagnosis present

## 2013-10-16 DIAGNOSIS — Z825 Family history of asthma and other chronic lower respiratory diseases: Secondary | ICD-10-CM

## 2013-10-16 DIAGNOSIS — Z79899 Other long term (current) drug therapy: Secondary | ICD-10-CM

## 2013-10-16 DIAGNOSIS — J069 Acute upper respiratory infection, unspecified: Secondary | ICD-10-CM | POA: Diagnosis present

## 2013-10-16 DIAGNOSIS — E43 Unspecified severe protein-calorie malnutrition: Secondary | ICD-10-CM | POA: Diagnosis present

## 2013-10-16 DIAGNOSIS — F411 Generalized anxiety disorder: Secondary | ICD-10-CM | POA: Diagnosis present

## 2013-10-16 DIAGNOSIS — F1210 Cannabis abuse, uncomplicated: Secondary | ICD-10-CM | POA: Diagnosis present

## 2013-10-16 DIAGNOSIS — F17200 Nicotine dependence, unspecified, uncomplicated: Secondary | ICD-10-CM | POA: Diagnosis present

## 2013-10-16 DIAGNOSIS — R87612 Low grade squamous intraepithelial lesion on cytologic smear of cervix (LGSIL): Secondary | ICD-10-CM

## 2013-10-16 DIAGNOSIS — N390 Urinary tract infection, site not specified: Secondary | ICD-10-CM | POA: Diagnosis present

## 2013-10-16 DIAGNOSIS — Z833 Family history of diabetes mellitus: Secondary | ICD-10-CM

## 2013-10-16 DIAGNOSIS — D649 Anemia, unspecified: Secondary | ICD-10-CM | POA: Diagnosis present

## 2013-10-16 DIAGNOSIS — R319 Hematuria, unspecified: Secondary | ICD-10-CM | POA: Diagnosis present

## 2013-10-16 DIAGNOSIS — Z91199 Patient's noncompliance with other medical treatment and regimen due to unspecified reason: Secondary | ICD-10-CM

## 2013-10-16 DIAGNOSIS — D61818 Other pancytopenia: Secondary | ICD-10-CM | POA: Diagnosis present

## 2013-10-16 DIAGNOSIS — F329 Major depressive disorder, single episode, unspecified: Secondary | ICD-10-CM | POA: Diagnosis present

## 2013-10-16 DIAGNOSIS — J45909 Unspecified asthma, uncomplicated: Secondary | ICD-10-CM | POA: Diagnosis present

## 2013-10-16 DIAGNOSIS — Z9119 Patient's noncompliance with other medical treatment and regimen: Secondary | ICD-10-CM

## 2013-10-16 DIAGNOSIS — D696 Thrombocytopenia, unspecified: Secondary | ICD-10-CM | POA: Diagnosis present

## 2013-10-16 DIAGNOSIS — Z8249 Family history of ischemic heart disease and other diseases of the circulatory system: Secondary | ICD-10-CM

## 2013-10-16 DIAGNOSIS — A419 Sepsis, unspecified organism: Principal | ICD-10-CM | POA: Diagnosis present

## 2013-10-16 DIAGNOSIS — F32A Depression, unspecified: Secondary | ICD-10-CM | POA: Diagnosis present

## 2013-10-16 HISTORY — DX: Unspecified asthma, uncomplicated: J45.909

## 2013-10-16 LAB — CBC WITH DIFFERENTIAL/PLATELET
Basophils Absolute: 0 10*3/uL (ref 0.0–0.1)
Basophils Relative: 0 % (ref 0–1)
EOS ABS: 0 10*3/uL (ref 0.0–0.7)
EOS PCT: 1 % (ref 0–5)
HCT: 29.1 % — ABNORMAL LOW (ref 36.0–46.0)
Hemoglobin: 9.3 g/dL — ABNORMAL LOW (ref 12.0–15.0)
LYMPHS ABS: 0.6 10*3/uL — AB (ref 0.7–4.0)
LYMPHS PCT: 12 % (ref 12–46)
MCH: 25.3 pg — AB (ref 26.0–34.0)
MCHC: 32 g/dL (ref 30.0–36.0)
MCV: 79.3 fL (ref 78.0–100.0)
Monocytes Absolute: 0.5 10*3/uL (ref 0.1–1.0)
Monocytes Relative: 11 % (ref 3–12)
NEUTROS PCT: 76 % (ref 43–77)
Neutro Abs: 3.6 10*3/uL (ref 1.7–7.7)
Platelets: 187 10*3/uL (ref 150–400)
RBC: 3.67 MIL/uL — AB (ref 3.87–5.11)
RDW: 15.6 % — ABNORMAL HIGH (ref 11.5–15.5)
WBC: 4.7 10*3/uL (ref 4.0–10.5)

## 2013-10-16 LAB — URINALYSIS, ROUTINE W REFLEX MICROSCOPIC
BILIRUBIN URINE: NEGATIVE
GLUCOSE, UA: NEGATIVE mg/dL
KETONES UR: NEGATIVE mg/dL
Nitrite: POSITIVE — AB
PH: 6.5 (ref 5.0–8.0)
Protein, ur: 30 mg/dL — AB
SPECIFIC GRAVITY, URINE: 1.016 (ref 1.005–1.030)
Urobilinogen, UA: 1 mg/dL (ref 0.0–1.0)

## 2013-10-16 LAB — URINE MICROSCOPIC-ADD ON

## 2013-10-16 LAB — COMPREHENSIVE METABOLIC PANEL
ALK PHOS: 111 U/L (ref 39–117)
ALT: 9 U/L (ref 0–35)
AST: 21 U/L (ref 0–37)
Albumin: 2.8 g/dL — ABNORMAL LOW (ref 3.5–5.2)
BUN: 11 mg/dL (ref 6–23)
CALCIUM: 8.4 mg/dL (ref 8.4–10.5)
CO2: 24 mEq/L (ref 19–32)
Chloride: 96 mEq/L (ref 96–112)
Creatinine, Ser: 0.7 mg/dL (ref 0.50–1.10)
GFR calc Af Amer: >90 mL/min (ref >90)
GFR calc non Af Amer: >90 mL/min (ref >90)
GLUCOSE: 78 mg/dL (ref 70–99)
POTASSIUM: 3.3 meq/L — AB (ref 3.7–5.3)
SODIUM: 134 meq/L — AB (ref 137–147)
TOTAL PROTEIN: 8.6 g/dL — AB (ref 6.0–8.3)
Total Bilirubin: 0.7 mg/dL (ref 0.3–1.2)

## 2013-10-16 LAB — PREGNANCY, URINE: Preg Test, Ur: NEGATIVE

## 2013-10-16 LAB — LACTIC ACID, PLASMA: Lactic Acid, Venous: 1 mmol/L (ref 0.5–2.2)

## 2013-10-16 MED ORDER — IBUPROFEN 800 MG PO TABS
800.0000 mg | ORAL_TABLET | Freq: Once | ORAL | Status: AC
  Administered 2013-10-16: 800 mg via ORAL
  Filled 2013-10-16: qty 1

## 2013-10-16 MED ORDER — ONDANSETRON HCL 4 MG/2ML IJ SOLN
4.0000 mg | Freq: Once | INTRAMUSCULAR | Status: AC
  Administered 2013-10-16: 4 mg via INTRAVENOUS
  Filled 2013-10-16: qty 2

## 2013-10-16 MED ORDER — SODIUM CHLORIDE 0.9 % IV BOLUS (SEPSIS)
1000.0000 mL | Freq: Once | INTRAVENOUS | Status: AC
  Administered 2013-10-16: 1000 mL via INTRAVENOUS

## 2013-10-16 MED ORDER — ACETAMINOPHEN 325 MG PO TABS: 650.0000 mg | ORAL_TABLET | Freq: Four times a day (QID) | ORAL | Status: DC | PRN

## 2013-10-16 MED ORDER — ONDANSETRON HCL 4 MG/2ML IJ SOLN
4.0000 mg | Freq: Four times a day (QID) | INTRAMUSCULAR | Status: DC | PRN
  Administered 2013-10-16 – 2013-10-18 (×4): 4 mg via INTRAVENOUS
  Filled 2013-10-16 (×4): qty 2

## 2013-10-16 MED ORDER — HYDROCODONE-ACETAMINOPHEN 5-325 MG PO TABS
1.0000 | ORAL_TABLET | ORAL | Status: AC | PRN
  Administered 2013-10-16 – 2013-10-17 (×2): 1 via ORAL
  Filled 2013-10-16 (×2): qty 1

## 2013-10-16 MED ORDER — ACETAMINOPHEN 325 MG PO TABS
650.0000 mg | ORAL_TABLET | Freq: Once | ORAL | Status: DC
  Filled 2013-10-16: qty 2

## 2013-10-16 MED ORDER — SODIUM CHLORIDE 0.9 % IV SOLN
INTRAVENOUS | Status: DC
Start: 2013-10-16 — End: 2013-10-19
  Administered 2013-10-16 – 2013-10-18 (×3): via INTRAVENOUS

## 2013-10-16 MED ORDER — ALBUTEROL SULFATE (2.5 MG/3ML) 0.083% IN NEBU
5.0000 mg | INHALATION_SOLUTION | Freq: Once | RESPIRATORY_TRACT | Status: AC
  Administered 2013-10-16: 5 mg via RESPIRATORY_TRACT
  Filled 2013-10-16: qty 6

## 2013-10-16 MED ORDER — VANCOMYCIN HCL IN DEXTROSE 1-5 GM/200ML-% IV SOLN
1000.0000 mg | Freq: Once | INTRAVENOUS | Status: AC
  Administered 2013-10-16: 1000 mg via INTRAVENOUS
  Filled 2013-10-16: qty 200

## 2013-10-16 MED ORDER — FENTANYL CITRATE 0.05 MG/ML IJ SOLN
50.0000 ug | Freq: Once | INTRAMUSCULAR | Status: AC
Start: 2013-10-16 — End: 2013-10-16
  Administered 2013-10-16: 50 ug via INTRAVENOUS
  Filled 2013-10-16: qty 2

## 2013-10-16 MED ORDER — ONDANSETRON 4 MG PO TBDP
4.0000 mg | ORAL_TABLET | Freq: Once | ORAL | Status: AC
  Administered 2013-10-16: 4 mg via ORAL
  Filled 2013-10-16: qty 1

## 2013-10-16 MED ORDER — IBUPROFEN 800 MG PO TABS
800.0000 mg | ORAL_TABLET | ORAL | Status: DC | PRN
  Administered 2013-10-17: 800 mg via ORAL
  Filled 2013-10-16: qty 1

## 2013-10-16 MED ORDER — POTASSIUM CHLORIDE CRYS ER 20 MEQ PO TBCR
40.0000 meq | EXTENDED_RELEASE_TABLET | Freq: Once | ORAL | Status: AC
  Administered 2013-10-16: 40 meq via ORAL
  Filled 2013-10-16: qty 2

## 2013-10-16 MED ORDER — ONDANSETRON HCL 4 MG/2ML IJ SOLN: 4.0000 mg | Freq: Four times a day (QID) | INTRAMUSCULAR | Status: DC | PRN

## 2013-10-16 MED ORDER — ACETAMINOPHEN 325 MG PO TABS
650.0000 mg | ORAL_TABLET | Freq: Four times a day (QID) | ORAL | Status: DC | PRN
  Administered 2013-10-16: 650 mg via ORAL
  Filled 2013-10-16: qty 2

## 2013-10-16 MED ORDER — DEXTROSE 5 % IV SOLN
1.0000 g | INTRAVENOUS | Status: DC
  Administered 2013-10-16 – 2013-10-17 (×2): 1 g via INTRAVENOUS
  Filled 2013-10-16 (×3): qty 10

## 2013-10-16 MED ORDER — DEXTROSE 5 % IV SOLN
500.0000 mg | INTRAVENOUS | Status: DC
  Administered 2013-10-16: 500 mg via INTRAVENOUS
  Filled 2013-10-16 (×2): qty 500

## 2013-10-16 MED ORDER — PIPERACILLIN-TAZOBACTAM 3.375 G IVPB 30 MIN
3.3750 g | Freq: Once | INTRAVENOUS | Status: AC
  Administered 2013-10-16: 3.375 g via INTRAVENOUS
  Filled 2013-10-16: qty 50

## 2013-10-16 MED ORDER — ENOXAPARIN SODIUM 40 MG/0.4ML ~~LOC~~ SOLN
40.0000 mg | SUBCUTANEOUS | Status: DC
  Filled 2013-10-16 (×3): qty 0.4

## 2013-10-16 NOTE — Progress Notes (Signed)
[facility] provided pt with a list of primary care resources and a [facility] [employer] Card application to help patient establish primary care.  °

## 2013-10-16 NOTE — ED Notes (Signed)
Attempted to call report to floor 

## 2013-10-16 NOTE — ED Notes (Signed)
Pt heart rate elevated and pt actively vomiting.  Notified pt to be a level 3 and to move to acute care.

## 2013-10-16 NOTE — ED Notes (Signed)
Initial Contact - pt to RM5, changed to hospital gown, placed to monitor.  Pt reports 2 week history of numerous complaints - cough/chest congestion, fevers up to 102, nausea, vomiting and diarrhea, pt also reports serous drainage from umbilicus.  Pt reports history of HIV and not on medication regimen at this time.  Pt denies CP.  NSR-ST on monitor.  Speaking full/clear sentences, rr even/un-lab, insp/exp wheeze noted.  Dry cough noted.  Pt reports nonproductive.  Pt denies SOB.  MAEI, ambulatory with steady gait.  Skin hot, warm, dry.  Abd s/nt/nd, pt reports unable to tolerate PO.  A+Ox4.

## 2013-10-16 NOTE — ED Notes (Signed)
Meal Tray ordered.  

## 2013-10-16 NOTE — H&P (Signed)
[facility] History and Physical  Beverly [name] VFI:[id number] DOB: 1980/08/20 DOA: 10/16/2013  Referring physician: EDP PCP: No primary provider on file.   Chief Complaint: malaise, cough, congestion, fever  HPI: Beverly [name] is a 33 y.o. female with PMH of HIV/AIDs, non compliance, Anxiety, Asthma, presents to the ER with the above complaint. She has not taken her HIV meds or had any follow up for this in over 2 years. For the last 1-2weeks, reports generalized malaise, cough, congestion, productive  greenish sputum, fevers and chills. In addition, also reports foul smelling, darker urine and bilateral flank pain for 1 week. In ER, febrile, tachycardic, not hypoxic, clear CXR and abnormal UA    Review of Systems:  Constitutional:  No weight loss, night sweats, Fevers, chills, fatigue.  HEENT:  No headaches, Difficulty swallowing,Tooth/dental problems,Sore throat,  No sneezing, itching, ear ache, nasal congestion, post nasal drip,  Cardio-vascular:  No chest pain, Orthopnea, PND, swelling in lower extremities, anasarca, dizziness, palpitations  GI:  No heartburn, indigestion, abdominal pain, nausea, vomiting, diarrhea, change in bowel habits, loss of appetite  Resp:  No shortness of breath with exertion or at rest. No excess mucus, no productive cough, No non-productive cough, No coughing up of blood.No change in color of mucus.No wheezing.No chest wall deformity  Skin:  no rash or lesions.  GU:  no dysuria, change in color of urine, no urgency or frequency. No flank pain.  Musculoskeletal:  No joint pain or swelling. No decreased range of motion. No back pain.  Psych:  No change in mood or affect. No depression or anxiety. No memory loss.   Past Medical History  Diagnosis Date  . Anxiety   . Depression   . HIV infection   . Asthma    Past Surgical History  Procedure Laterality Date  . Cesarean section     Social History:  reports that she has been  smoking Cigarettes.  She has a .9 pack-year smoking history. She has never used smokeless tobacco. She reports that she drinks alcohol. She reports that she uses illicit drugs (Marijuana) about 5 times per week.  Allergies  Allergen Reactions  . Morphine And Related Hives and Itching    Family History  Problem Relation Age of Onset  . Hypertension Other   . Cancer Other   . Diabetes Other   . Asthma Other      Prior to Admission medications   Medication Sig Start Date End Date Taking? Authorizing Provider  Darunavir Ethanolate (PREZISTA) 800 MG TABS Take 1 tablet by mouth daily. 07/08/11  Yes Thayer Headings, MD  emtricitabine-tenofovir (TRUVADA) 200-300 MG per tablet Take 1 tablet by mouth daily.   Yes Historical Provider, MD  ibuprofen (ADVIL,MOTRIN) 200 MG tablet Take 400 mg by mouth every 6 (six) hours as needed (for pain).    Yes Historical Provider, MD  Pseudoephedrine-APAP-DM (DAYQUIL PO) Take 2 capsules by mouth once.   Yes Historical Provider, MD  ritonavir (NORVIR) 100 MG capsule Take 100 mg by mouth daily with breakfast.   Yes Historical Provider, MD   Physical Exam: Filed Vitals:   10/16/13 1628  BP: 118/70  Pulse: 117  Temp: 99.3 F (37.4 C)  Resp: 18    BP 118/70  Pulse 117  Temp(Src) 99.3 F (37.4 C) (Oral)  Resp 18  SpO2 97%  LMP 09/22/2013  General:  Appears calm and comfortable, ill appearing Eyes: PERRL, normal lids, irises & conjunctiva ENT: grossly normal hearing, lips &  tongue Neck: no LAD, masses or thyromegaly Cardiovascular: RRR, no m/r/g. No LE edema. Respiratory: CTA bilaterally, no w/r/r. Normal respiratory effort. Abdomen: soft, NT, ND, BS present, bilateral CVA tenderness Skin: no rash or induration seen on limited exam Musculoskeletal: grossly normal tone BUE/BLE Psychiatric: grossly normal mood and affect, speech fluent and appropriate Neurologic: grossly non-focal.          Labs on Admission:  Basic Metabolic Panel:  Recent  Labs Lab 10/16/13 1430  NA 134*  K 3.3*  CL 96  CO2 24  GLUCOSE 78  BUN 11  CREATININE 0.70  CALCIUM 8.4   Liver Function Tests:  Recent Labs Lab 10/16/13 1430  AST 21  ALT 9  ALKPHOS 111  BILITOT 0.7  PROT 8.6*  ALBUMIN 2.8*   No results found for this basename: LIPASE, AMYLASE,  in the last 168 hours No results found for this basename: AMMONIA,  in the last 168 hours CBC:  Recent Labs Lab 10/16/13 1430  WBC 4.7  NEUTROABS 3.6  HGB 9.3*  HCT 29.1*  MCV 79.3  PLT 187   Cardiac Enzymes: No results found for this basename: CKTOTAL, CKMB, CKMBINDEX, TROPONINI,  in the last 168 hours  BNP (last 3 results) No results found for this basename: PROBNP,  in the last 8760 hours CBG: No results found for this basename: GLUCAP,  in the last 168 hours  Radiological Exams on Admission: Dg Chest 2 View  10/16/2013   EXAM: CHEST  2 VIEW  COMPARISON:  None.  FINDINGS: The heart size and mediastinal contours are within normal limits. Both lungs are clear. The visualized skeletal structures are unremarkable.  IMPRESSION: No active cardiopulmonary disease.   Electronically Signed   By: Marcello Moores  Register   On: 10/16/2013 14:13   Assessment/Plan  1. SIRS -symptoms of URI/bronchitis as well as UTI/pyelonephritis -check Urine Cx -Blood CX -influenza PCR -IVF, supportive care -Empiric ceftriaxone and Zithromax for now -CXR clear without findings concerning for PCP  2. HIV/AIDS -last Cd4 of 60 in 1/13, viral load undetectable -non compliant with meds and FU for 2 years now -will need ID tomorrow  3. Depression -stable, no suicidal ideation -needs FU for this  4. Anemia -likely due to 2 -monitor  DVT proph: lovenox  Code Status: Full Code Family Communication: None at bedside Disposition Plan: inpatient  Time spent:39min  [facility] Pager [phone]

## 2013-10-16 NOTE — ED Notes (Signed)
Pt states symptoms for a few weeks.  Generalized aches, cough, congestion. Fever, decreased appetite, n/v.  Using otc pain meds, congestion meds with no relief.

## 2013-10-16 NOTE — ED Notes (Signed)
Hospitalist at bedside 

## 2013-10-16 NOTE — ED Notes (Signed)
Dr. Regenia Skeeter notified of elevated VS, no new orders at this time.

## 2013-10-16 NOTE — ED Notes (Signed)
Phlebotomy at bedside. When Cultures are drawn will start abx.

## 2013-10-16 NOTE — ED Notes (Signed)
MD at bedside. 

## 2013-10-16 NOTE — Progress Notes (Signed)
Utilization Review completed.  Lalanya Rufener RN CM  

## 2013-10-17 DIAGNOSIS — F329 Major depressive disorder, single episode, unspecified: Secondary | ICD-10-CM | POA: Diagnosis present

## 2013-10-17 DIAGNOSIS — E43 Unspecified severe protein-calorie malnutrition: Secondary | ICD-10-CM | POA: Diagnosis present

## 2013-10-17 DIAGNOSIS — F411 Generalized anxiety disorder: Secondary | ICD-10-CM

## 2013-10-17 DIAGNOSIS — B20 Human immunodeficiency virus [HIV] disease: Secondary | ICD-10-CM

## 2013-10-17 DIAGNOSIS — F341 Dysthymic disorder: Secondary | ICD-10-CM

## 2013-10-17 DIAGNOSIS — F17210 Nicotine dependence, cigarettes, uncomplicated: Secondary | ICD-10-CM | POA: Diagnosis present

## 2013-10-17 DIAGNOSIS — D649 Anemia, unspecified: Secondary | ICD-10-CM

## 2013-10-17 DIAGNOSIS — D696 Thrombocytopenia, unspecified: Secondary | ICD-10-CM | POA: Diagnosis present

## 2013-10-17 DIAGNOSIS — R300 Dysuria: Secondary | ICD-10-CM

## 2013-10-17 DIAGNOSIS — F32A Depression, unspecified: Secondary | ICD-10-CM | POA: Diagnosis present

## 2013-10-17 DIAGNOSIS — R509 Fever, unspecified: Secondary | ICD-10-CM

## 2013-10-17 DIAGNOSIS — J45909 Unspecified asthma, uncomplicated: Secondary | ICD-10-CM | POA: Diagnosis present

## 2013-10-17 LAB — IRON AND TIBC
IRON: 13 ug/dL — AB (ref 42–135)
Saturation Ratios: 7 % — ABNORMAL LOW (ref 20–55)
TIBC: 177 ug/dL — AB (ref 250–470)
UIBC: 164 ug/dL (ref 125–400)

## 2013-10-17 LAB — BASIC METABOLIC PANEL
BUN: 9 mg/dL (ref 6–23)
CO2: 25 mEq/L (ref 19–32)
Calcium: 7.3 mg/dL — ABNORMAL LOW (ref 8.4–10.5)
Chloride: 107 mEq/L (ref 96–112)
Creatinine, Ser: 0.73 mg/dL (ref 0.50–1.10)
GFR calc Af Amer: >90 mL/min (ref >90)
GFR calc non Af Amer: >90 mL/min (ref >90)
Glucose, Bld: 95 mg/dL (ref 70–99)
Potassium: 3.5 mEq/L — ABNORMAL LOW (ref 3.7–5.3)
Sodium: 141 mEq/L (ref 137–147)

## 2013-10-17 LAB — INFLUENZA PANEL BY PCR (TYPE A & B)
H1N1 flu by pcr: NOT DETECTED
Influenza A By PCR: NEGATIVE
Influenza B By PCR: NEGATIVE

## 2013-10-17 LAB — CBC
HCT: 25 % — ABNORMAL LOW (ref 36.0–46.0)
HEMOGLOBIN: 7.7 g/dL — AB (ref 12.0–15.0)
MCH: 25 pg — ABNORMAL LOW (ref 26.0–34.0)
MCHC: 30.8 g/dL (ref 30.0–36.0)
MCV: 81.2 fL (ref 78.0–100.0)
Platelets: 143 10*3/uL — ABNORMAL LOW (ref 150–400)
RBC: 3.08 MIL/uL — AB (ref 3.87–5.11)
RDW: 15.6 % — ABNORMAL HIGH (ref 11.5–15.5)
WBC: 2.5 10*3/uL — AB (ref 4.0–10.5)

## 2013-10-17 LAB — RETICULOCYTES
RBC.: 2.98 MIL/uL — ABNORMAL LOW (ref 3.87–5.11)
Retic Count, Absolute: 11.9 10*3/uL — ABNORMAL LOW (ref 19.0–186.0)
Retic Ct Pct: 0.4 % (ref 0.4–3.1)

## 2013-10-17 LAB — VITAMIN B12: Vitamin B-12: 568 pg/mL (ref 211–911)

## 2013-10-17 LAB — FERRITIN: FERRITIN: 152 ng/mL (ref 10–291)

## 2013-10-17 LAB — T-HELPER CELLS (CD4) COUNT (NOT AT ARMC)
CD4 % Helper T Cell: <1 % — ABNORMAL LOW (ref 33–55)
CD4 T Cell Abs: <10 /uL — ABNORMAL LOW (ref 400–2700)

## 2013-10-17 LAB — FOLATE: Folate: >20 ng/mL

## 2013-10-17 MED ORDER — BOOST / RESOURCE BREEZE PO LIQD: 1.0000 | Freq: Two times a day (BID) | ORAL | Status: DC

## 2013-10-17 MED ORDER — HYDROCODONE-ACETAMINOPHEN 5-325 MG PO TABS
1.0000 | ORAL_TABLET | ORAL | Status: DC | PRN
  Administered 2013-10-17 – 2013-10-19 (×6): 1 via ORAL
  Filled 2013-10-17 (×6): qty 1

## 2013-10-17 MED ORDER — SULFAMETHOXAZOLE-TMP DS 800-160 MG PO TABS
1.0000 | ORAL_TABLET | Freq: Every day | ORAL | Status: DC
  Administered 2013-10-17 – 2013-10-18 (×2): 1 via ORAL
  Filled 2013-10-17 (×2): qty 1

## 2013-10-17 NOTE — Care Management Note (Addendum)
    Page 1 of 1   10/17/2013     3:57:18 PM CARE MANAGEMENT NOTE 10/17/2013  Patient:  Beverly [name], Beverly [name]   Account Number:  [id number]  Date Initiated:  10/17/2013  Documentation initiated by:  Dessa Phi  Subjective/Objective Assessment:   33 Y/O F ADMITTED W/SIR.NM:M76,KGS COMPLIANT W/MEDS.     Action/Plan:   FROM HOME.NO INSURANCE.   Anticipated DC Date:  10/19/2013   Anticipated DC Plan:  HOME/SELF CARE  In-house referral  [place]  CM consult  [facility]      Choice offered to / List presented to:             Status of service:  In process, will continue to follow Medicare Important Message given?   (If response is "NO", the following Medicare IM given date fields will be blank) Date Medicare IM given:   Date Additional Medicare IM given:    Discharge Disposition:    Per UR Regulation:  Reviewed for med. necessity/level of care/duration of stay  If discussed at [place] of Stay Meetings, dates discussed:    Comments:  10/17/13 [facility] RN,BSN NCM [place] COORDINATOR FOR APPT.PATIENT GAVE TEL#[phone] AS CONTACT  FOR [facility] TO REACH HER(CAN LEAVE A MESSAGE).PROVIDED W/ COMMUNITY RESOURCES-INSURANCE WEBSITE,$4[facility] MED LIST,COMMUNITY RESOURCES).AWAIT ID CONS.

## 2013-10-17 NOTE — Progress Notes (Addendum)
[facility] PROGRESS NOTE  Beverly [name]:[id number] DOB: 05-May-1981 DOA: 10/16/2013 PCP: No primary provider on file. Brief Narrative Beverly [name] is a 33 y.o. female with PMH of HIV/AIDs, non compliance, Anxiety, Asthma, presents to the ER with malaise, fever, cough, flank pain. She has not taken her HIV meds or had any follow up for this in over 2 years.  For the last 1-2weeks, reports generalized malaise, cough, congestion, productive greenish sputum, fevers and chills.  In addition, also reports foul smelling, darker urine and bilateral flank pain for 1 week.  In ER, febrile, tachycardic, not hypoxic, clear CXR and abnormal UA. Admitted with SIRS, pyelonephritis, ID consulted  Assessment/Plan: 1. SIRS  -symptoms of URI/bronchitis as well as UTI/pyelonephritis  -FU Urine Cx  -FU Blood CX  -influenza PCR negative -IVF, supportive care  -Continue  ceftriaxone and Zithromax for now  -CXR clear without findings concerning for PCP   2. HIV/AIDS  -last Cd4 of 60 in 1/13, viral load undetectable  -non compliant with meds and FU for 2 years now  -repeat CD4 <10 -ID consult requested, patients wants to establish care again  3. Depression  -stable, no suicidal ideation  -needs FU for this   4. Anemia/Pancytopenia -due to HIV/AIDs  -could be iron deficient too, check anemia panel  DVT proph: lovenox  Code Status: Full Code Family Communication: none at bedside Disposition Plan: home when improved   Consultants:  ID Dr.Campbell  Antibiotics:  Rocephin/Zithromax  HPI/Subjective: Still weak, but feels better than yesterday, some cough, congestion  Objective: Filed Vitals:   10/17/13 0546  BP: 121/90  Pulse: 76  Temp: 97.6 F (36.4 C)  Resp: 18    Intake/Output Summary (Last 24 hours) at 10/17/13 1139 Last data filed at 10/17/13 0547  Gross per 24 hour  Intake   1460 ml  Output      0 ml  Net   1460 ml   Filed Weights   10/17/13 0546   Weight: 67.858 kg (149 lb 9.6 oz)    Exam:   General:  AAOx3, no distress  Cardiovascular: S1S2/RRR  Respiratory: CTAB  Abdomen: soft, Nt, BS present  Musculoskeletal:no edema c/c   Data Reviewed: Basic Metabolic Panel:  Recent Labs Lab 10/16/13 1430 10/17/13 0355  NA 134* 141  K 3.3* 3.5*  CL 96 107  CO2 24 25  GLUCOSE 78 95  BUN 11 9  CREATININE 0.70 0.73  CALCIUM 8.4 7.3*   Liver Function Tests:  Recent Labs Lab 10/16/13 1430  AST 21  ALT 9  ALKPHOS 111  BILITOT 0.7  PROT 8.6*  ALBUMIN 2.8*   No results found for this basename: LIPASE, AMYLASE,  in the last 168 hours No results found for this basename: AMMONIA,  in the last 168 hours CBC:  Recent Labs Lab 10/16/13 1430 10/17/13 0355  WBC 4.7 2.5*  NEUTROABS 3.6  --   HGB 9.3* 7.7*  HCT 29.1* 25.0*  MCV 79.3 81.2  PLT 187 143*   Cardiac Enzymes: No results found for this basename: CKTOTAL, CKMB, CKMBINDEX, TROPONINI,  in the last 168 hours BNP (last 3 results) No results found for this basename: PROBNP,  in the last 8760 hours CBG: No results found for this basename: GLUCAP,  in the last 168 hours  Recent Results (from the past 240 hour(s))  CULTURE, BLOOD (ROUTINE X 2)     Status: None   Collection Time    10/16/13  3:55 PM  Result Value Ref Range Status   Specimen Description BLOOD LEFT HAND   Final   Special Requests BOTTLES DRAWN AEROBIC AND ANAEROBIC 5CC    Final   Culture  Setup Time     Final   Value: 10/16/2013 19:06     Performed at [facility]   Culture     Final   Value:        BLOOD CULTURE RECEIVED NO GROWTH TO DATE CULTURE WILL BE HELD FOR 5 DAYS BEFORE ISSUING A FINAL NEGATIVE REPORT     Performed at [facility]   Report Status PENDING   Incomplete  CULTURE, BLOOD (ROUTINE X 2)     Status: None   Collection Time    10/16/13  3:55 PM      Result Value Ref Range Status   Specimen Description BLOOD LEFT HAND    Final   Special Requests BOTTLES  DRAWN AEROBIC AND ANAEROBIC 5CC   Final   Culture  Setup Time     Final   Value: 10/16/2013 19:06     Performed at [facility]   Culture     Final   Value:        BLOOD CULTURE RECEIVED NO GROWTH TO DATE CULTURE WILL BE HELD FOR 5 DAYS BEFORE ISSUING A FINAL NEGATIVE REPORT     Performed at [facility]   Report Status PENDING   Incomplete     Studies: Dg Chest 2 View  10/16/2013   EXAM: CHEST  2 VIEW  COMPARISON:  None.  FINDINGS: The heart size and mediastinal contours are within normal limits. Both lungs are clear. The visualized skeletal structures are unremarkable.  IMPRESSION: No active cardiopulmonary disease.   Electronically Signed   By: [place]   On: 10/16/2013 14:13    Scheduled Meds: . azithromycin  500 mg Intravenous Q24H  . cefTRIAXone (ROCEPHIN)  IV  1 g Intravenous Q24H  . enoxaparin (LOVENOX) injection  40 mg Subcutaneous Q24H  . feeding supplement (RESOURCE BREEZE)  1 Container Oral BID BM   Continuous Infusions: . sodium chloride 100 mL/hr at 10/16/13 1811   Antibiotics Given (last 72 hours)   Date/Time Action Medication Dose Rate   10/16/13 2020 Given   cefTRIAXone (ROCEPHIN) 1 g in dextrose 5 % 50 mL IVPB 1 g 100 mL/hr   10/16/13 2021 Given   azithromycin (ZITHROMAX) 500 mg in dextrose 5 % 250 mL IVPB 500 mg 250 mL/hr      Principal Problem:   SIRS (systemic inflammatory response syndrome) Active Problems:   HIV INFECTION   Anxiety state, unspecified   UTI (urinary tract infection)   Pyelonephritis    Time spent: 73min    [name]  [facility] Pager [phone]. If 7PM-7AM, please contact night-coverage at [email or web address], password [facility] 10/17/2013, 11:39 AM  LOS: 1 day

## 2013-10-17 NOTE — Consult Note (Signed)
[facility] for Infectious Disease    Date of Admission:  10/16/2013           Day 2 ceftriaxone             Day 2 azithromycin       Reason for Consult: probable pyelonephritis and HIV infection currently off of therapy    Referring Physician:  Dr. Domenic Polite Primary Care Physician:  None  Principal Problem:   SIRS (systemic inflammatory response syndrome) Active Problems:   Pyelonephritis   HIV INFECTION   Anxiety state, unspecified   Protein-calorie malnutrition, severe   Depression   Anemia   Thrombocytopenia   Cigarette smoker   Asthma   . azithromycin  500 mg Intravenous Q24H  . cefTRIAXone (ROCEPHIN)  IV  1 g Intravenous Q24H  . enoxaparin (LOVENOX) injection  40 mg Subcutaneous Q24H  . feeding supplement (RESOURCE BREEZE)  1 Container Oral BID BM    Recommendations: 1. Continue empiric ceftriaxone; discontinue azithromycin 2. Await final culture results 3. Await HIV viral load results 4. Start prophylactic doses of trimethoprim sulfamethoxazole  Assessment: I suspect that her acute illness is due to pyelonephritis. I do not see any significant clinical or radiographic evidence of pneumonia. I recommend continuing ceftriaxone alone pending results of final cultures and further observation. Not surprisingly her CD4 count is very low. I will start pneumocystis prophylaxis. She is interested in getting back into care for her HIV infection. I can also arrange counseling for her depression and anxiety.   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 She was last seen by my partner, Dr. Talbot Grumbling in January of 2013. At that time she recently started on Truvada, Prezista and Norvir. Her load suppressed to undetectable levels and her CD4 count rose slightly to 60. However she states that she became very depressed and quit taking her medications about 2 years ago. She has been feeling worse over the past  month has had intermittent fever and chills. She has had anorexia and recent 10 pound weight loss. She has had diffuse pains including bilateral flank tenderness. She admits to having some problem with dysuria and diarrhea.  Her father has been sick with cancer and heart failure. She is trying to care for her 4 children ages 25-53 years old. Her 42 year old recently ran away from home. While caring for her children and father are recently she's been under a great deal of stress and states that her anxiety and depression have been worse. She lost her job about one month ago which was added additional financial stress. She is having problems with transportation.   Review of Systems: Constitutional: positive for anorexia, chills, fevers, malaise and weight loss, negative for sweats Eyes: negative Ears, nose, mouth, throat, and face: negative Respiratory: negative Cardiovascular: negative Gastrointestinal: positive for abdominal pain, nausea and vomiting, negative for dyspepsia, dysphagia and odynophagia Genitourinary:positive for dysuria, negative for hematuria and urinary incontinence  Past Medical History  Diagnosis Date  . Anxiety   . Depression   . HIV infection   . Asthma     History  Substance Use Topics  . Smoking status: Current Every Day Smoker -- 0.30 packs/day for 3 years    Types: Cigarettes  . Smokeless tobacco: Never Used  . Alcohol Use: Yes     Comment: occasional    Family History  Problem Relation Age  of Onset  . Hypertension Other   . Cancer Other   . Diabetes Other   . Asthma Other    Allergies  Allergen Reactions  . Morphine And Related Hives and Itching    Just can't take "morphine", vicodin is OK    OBJECTIVE: Blood pressure 112/68, pulse 65, temperature 98.2 F (36.8 C), temperature source Oral, resp. rate 16, height 5\' 1"  (1.549 m), weight 149 lb 9.6 oz (67.858 kg), last menstrual period 09/22/2013, SpO2 99.00%. General:  She is alert and in no  distress Skin:  No rash Oral: No oropharyngeal lesions. Her teeth are in good condition Eyes: Normal external exam Lymph nodes: Suggestion of scarring in both axilla from previous boils but no palpable adenopathy and no active boils Lungs:  Clear Cor:  Regular S1-S2 no murmur Abdomen:  Soft and nontender minimal CVA tenderness bilaterally. She expresses tenderness with palpation of muscles diffusely  Lab Results Lab Results  Component Value Date   WBC 2.5* 10/17/2013   HGB 7.7* 10/17/2013   HCT 25.0* 10/17/2013   MCV 81.2 10/17/2013   PLT 143* 10/17/2013    Lab Results  Component Value Date   CREATININE 0.73 10/17/2013   BUN 9 10/17/2013   NA 141 10/17/2013   K 3.5* 10/17/2013   CL 107 10/17/2013   CO2 25 10/17/2013    Lab Results  Component Value Date   ALT 9 10/16/2013   AST 21 10/16/2013   ALKPHOS 111 10/16/2013   BILITOT 0.7 10/16/2013    HIV 1 RNA Quant (copies/mL)  Date Value  07/08/2011 <20   05/01/2011 354*  01/22/2011 125000*     CD4 T Cell Abs (/uL)  Date Value  10/16/2013 <10*  07/08/2011 60*  05/01/2011 60*   Microbiology: Recent Results (from the past 240 hour(s))  CULTURE, BLOOD (ROUTINE X 2)     Status: None   Collection Time    10/16/13  3:55 PM      Result Value Ref Range Status   Specimen Description BLOOD LEFT HAND   Final   Special Requests BOTTLES DRAWN AEROBIC AND ANAEROBIC 5CC    Final   Culture  Setup Time     Final   Value: 10/16/2013 19:06     Performed at [facility]   Culture     Final   Value:        BLOOD CULTURE RECEIVED NO GROWTH TO DATE CULTURE WILL BE HELD FOR 5 DAYS BEFORE ISSUING A FINAL NEGATIVE REPORT     Performed at [facility]   Report Status PENDING   Incomplete  CULTURE, BLOOD (ROUTINE X 2)     Status: None   Collection Time    10/16/13  3:55 PM      Result Value Ref Range Status   Specimen Description BLOOD LEFT HAND    Final   Special Requests BOTTLES DRAWN AEROBIC AND ANAEROBIC 5CC   Final   Culture  Setup  Time     Final   Value: 10/16/2013 19:06     Performed at [facility]   Culture     Final   Value:        BLOOD CULTURE RECEIVED NO GROWTH TO DATE CULTURE WILL BE HELD FOR 5 DAYS BEFORE ISSUING A FINAL NEGATIVE REPORT     Performed at [facility]   Report Status PENDING   Incomplete    Michel Bickers, MD [facility] [place]  Group [phone] pager   [phone] cell 10/17/2013, 4:10 PM

## 2013-10-17 NOTE — Progress Notes (Signed)
INITIAL NUTRITION ASSESSMENT  DOCUMENTATION CODES Per approved criteria  -Severe malnutrition in the context of acute illness or injury  Pt meets criteria for severe MALNUTRITION in the context of acute illness as evidenced by <50% PO intake for 2 weeks 5% weight loss in two weeks, moderate muscle wasting and subcutaneous fat loss.   INTERVENTION: -Recommend Resource Breeze po BID, each supplement provides 250 kcal and 9 grams of protein -Encouraged intake of bland/easy to tolerate foods -Provided pt with "Nausea and Vomiting" Nutrition Therapy handout -Will continue to monitor   NUTRITION DIAGNOSIS: Inadequate oral intake related to nausea/vomiting as evidenced by PO intake <75%, unintentional wt loss.   Goal: Pt to meet >/= 90% of their estimated nutrition needs    Monitor:  Total protein/energy intake, GI profile, labs, weights  Reason for Assessment: MST  33 y.o. female  Admitting Dx: SIRS (systemic inflammatory response syndrome)  ASSESSMENT: Beverly [name] is a 34 y.o. female with PMH of HIV/AIDs, non compliance, Anxiety, Asthma, presents to the ER with  malaise, cough, congestion, fever  -Pt reported overall decreased appetite for past 2 weeks d/t ongoing nausea, vomiting, and fatigue -Diet recall indicates pt consuming largely liquid diet-juice, broth, popsicles, fruits, cucumbers -Would often go 24 hours w/out meals d/t lethargy/weakness -Had nausea last night w/emesis. Pt able to tolerate bland foods this morning-grits. Encouraged pt to continue to consume bland, dry starchy foods to assist with tolerance -Endorsed an unintentional 8 lb wt loss in 2 weeks -Does not tolerate milk or milk products. Does not tolerate Ensure/Boost supplements -Was willing to trial Resource Breeze supplements as pt with minimal protein intake -Albumin low -K Low -Nutrition Focused Physical Exam:  Subcutaneous Fat:  Orbital Region: WDL Upper Arm Region: moderate Thoracic and  Lumbar Region: WDL  Muscle:  Temple Region: moderate Clavicle Bone Region: WDL Clavicle and Acromion Bone Region: moderate Scapular Bone Region: moderate Dorsal Hand: WDL Patellar Region: WDL Anterior Thigh Region: moderate Posterior Calf Region: WDL  Edema: none noted    Height: Ht Readings from Last 1 Encounters:  10/17/13 5\' 1"  (1.549 m)    Weight: Wt Readings from Last 1 Encounters:  10/17/13 149 lb 9.6 oz (67.858 kg)    Ideal Body Weight: 105 lbs  % Ideal Body Weight: 142%  Wt Readings from Last 10 Encounters:  10/17/13 149 lb 9.6 oz (67.858 kg)  04/22/13 145 lb (65.772 kg)  07/21/11 137 lb (62.143 kg)  05/12/11 139 lb (63.05 kg)  02/19/11 143 lb (64.864 kg)  01/17/10 162 lb 12.8 oz (73.846 kg)  07/17/08 184 lb 14.4 oz (83.87 kg)    Usual Body Weight: 157 lbs  % Usual Body Weight: 95%  BMI:  Body mass index is 28.28 kg/(m^2).  Estimated Nutritional Needs: Kcal: 1700-1900 Protein: 75-90 gram Fluid: >/-2000 ml/daily  Skin: WDL  Diet Order: General  EDUCATION NEEDS: -Education needs addressed   Intake/Output Summary (Last 24 hours) at 10/17/13 1135 Last data filed at 10/17/13 0547  Gross per 24 hour  Intake   1460 ml  Output      0 ml  Net   1460 ml    Last BM: 4/27   Labs:   Recent Labs Lab 10/16/13 1430 10/17/13 0355  NA 134* 141  K 3.3* 3.5*  CL 96 107  CO2 24 25  BUN 11 9  CREATININE 0.70 0.73  CALCIUM 8.4 7.3*  GLUCOSE 78 95    CBG (last 3)  No results found for this basename:  GLUCAP,  in the last 72 hours  Scheduled Meds: . azithromycin  500 mg Intravenous Q24H  . cefTRIAXone (ROCEPHIN)  IV  1 g Intravenous Q24H  . enoxaparin (LOVENOX) injection  40 mg Subcutaneous Q24H  . feeding supplement (RESOURCE BREEZE)  1 Container Oral BID BM    Continuous Infusions: . sodium chloride 100 mL/hr at 10/16/13 1811    Past Medical History  Diagnosis Date  . Anxiety   . Depression   . HIV infection   . Asthma      Past Surgical History  Procedure Laterality Date  . Cesarean section      Atlee Abide MS RD LDN Clinical Dietitian [phone]

## 2013-10-18 DIAGNOSIS — J45909 Unspecified asthma, uncomplicated: Secondary | ICD-10-CM

## 2013-10-18 DIAGNOSIS — F17200 Nicotine dependence, unspecified, uncomplicated: Secondary | ICD-10-CM

## 2013-10-18 DIAGNOSIS — R112 Nausea with vomiting, unspecified: Secondary | ICD-10-CM

## 2013-10-18 LAB — COMPREHENSIVE METABOLIC PANEL
ALBUMIN: 1.9 g/dL — AB (ref 3.5–5.2)
ALT: 6 U/L (ref 0–35)
AST: 17 U/L (ref 0–37)
Alkaline Phosphatase: 86 U/L (ref 39–117)
BUN: 6 mg/dL (ref 6–23)
CO2: 23 mEq/L (ref 19–32)
CREATININE: 0.64 mg/dL (ref 0.50–1.10)
Calcium: 7.5 mg/dL — ABNORMAL LOW (ref 8.4–10.5)
Chloride: 108 mEq/L (ref 96–112)
GFR calc non Af Amer: >90 mL/min (ref >90)
GLUCOSE: 83 mg/dL (ref 70–99)
Potassium: 3.6 mEq/L — ABNORMAL LOW (ref 3.7–5.3)
Sodium: 139 mEq/L (ref 137–147)
TOTAL PROTEIN: 6.3 g/dL (ref 6.0–8.3)
Total Bilirubin: <0.2 mg/dL — ABNORMAL LOW (ref 0.3–1.2)

## 2013-10-18 LAB — HIV-1 RNA QUANT-NO REFLEX-BLD
HIV 1 RNA Quant: 84738 copies/mL — ABNORMAL HIGH (ref <20)
HIV-1 RNA Quant, Log: 4.93 {Log} — ABNORMAL HIGH (ref <1.30)

## 2013-10-18 LAB — URINE CULTURE: Colony Count: >=100000

## 2013-10-18 LAB — CBC
HEMATOCRIT: 24 % — AB (ref 36.0–46.0)
HEMOGLOBIN: 7.6 g/dL — AB (ref 12.0–15.0)
MCH: 25.3 pg — ABNORMAL LOW (ref 26.0–34.0)
MCHC: 31.7 g/dL (ref 30.0–36.0)
MCV: 80 fL (ref 78.0–100.0)
Platelets: 133 10*3/uL — ABNORMAL LOW (ref 150–400)
RBC: 3 MIL/uL — ABNORMAL LOW (ref 3.87–5.11)
RDW: 15.7 % — AB (ref 11.5–15.5)
WBC: 2 10*3/uL — ABNORMAL LOW (ref 4.0–10.5)

## 2013-10-18 LAB — ABO/RH: ABO/RH(D): A POS

## 2013-10-18 LAB — PREPARE RBC (CROSSMATCH)

## 2013-10-18 MED ORDER — SULFAMETHOXAZOLE-TMP DS 800-160 MG PO TABS
1.0000 | ORAL_TABLET | Freq: Two times a day (BID) | ORAL | Status: DC
  Administered 2013-10-18 – 2013-10-19 (×2): 1 via ORAL
  Filled 2013-10-18 (×4): qty 1

## 2013-10-18 NOTE — Progress Notes (Signed)
Patient ID: Beverly [name], female   DOB: 1980-09-05, 33 y.o.   MRN: [id number]         [facility] for Infectious Disease    Date of Admission:  10/16/2013           Day 3 ceftriaxone         Day 2 prophylactic trimethoprim sulfamethoxazole  Principal Problem:   SIRS (systemic inflammatory response syndrome) Active Problems:   Pyelonephritis   HIV INFECTION   Anxiety state, unspecified   Protein-calorie malnutrition, severe   Depression   Anemia   Thrombocytopenia   Cigarette smoker   Asthma   . cefTRIAXone (ROCEPHIN)  IV  1 g Intravenous Q24H  . feeding supplement (RESOURCE BREEZE)  1 Container Oral BID BM  . sulfamethoxazole-trimethoprim  1 tablet Oral Daily    Subjective: She is feeling a little bit better today. She still has diffuse aching pain. Her dysuria has resolved. She had some nausea with one episode of vomiting this afternoon. She thinks it may have been related to taking trimethoprim-sulfamethoxazole on an empty stomach. She has taken it as pneumocystis prophylaxis in the past and recalls tolerating it well as long as she took it with food. She is asking when she can go home.   Objective: Temp:  [97.4 F (36.3 C)-97.8 F (36.6 C)] 97.4 F (36.3 C) (04/29 1351) Pulse Rate:  [62-75] 62 (04/29 1351) Resp:  [15-16] 16 (04/29 1351) BP: (116-130)/(72-82) 116/76 mmHg (04/29 1351) SpO2:  [99 %-100 %] 100 % (04/29 1351)  General: She is sleeping but awakes easily and is in no distress  Skin: No rash  Lungs: Clear  Cor: Regular S1 and S2 with no murmurs  Abdomen: Soft nontender   Lab Results Lab Results  Component Value Date   WBC 2.0* 10/18/2013   HGB 7.6* 10/18/2013   HCT 24.0* 10/18/2013   MCV 80.0 10/18/2013   PLT 133* 10/18/2013    Lab Results  Component Value Date   CREATININE 0.64 10/18/2013   BUN 6 10/18/2013   NA 139 10/18/2013   K 3.6* 10/18/2013   CL 108 10/18/2013   CO2 23 10/18/2013    Lab Results  Component Value Date   ALT 6 10/18/2013     AST 17 10/18/2013   ALKPHOS 86 10/18/2013   BILITOT <0.2* 10/18/2013    HIV 1 RNA Quant (copies/mL)  Date Value  10/16/2013 84738*  07/08/2011 <20   05/01/2011 354*     CD4 T Cell Abs (/uL)  Date Value  10/16/2013 <10*  07/08/2011 60*  05/01/2011 60*    Microbiology: Recent Results (from the past 240 hour(s))  CULTURE, BLOOD (ROUTINE X 2)     Status: None   Collection Time    10/16/13  3:55 PM      Result Value Ref Range Status   Specimen Description BLOOD LEFT HAND   Final   Special Requests BOTTLES DRAWN AEROBIC AND ANAEROBIC 5CC    Final   Culture  Setup Time     Final   Value: 10/16/2013 19:06     Performed at [facility]   Culture     Final   Value:        BLOOD CULTURE RECEIVED NO GROWTH TO DATE CULTURE WILL BE HELD FOR 5 DAYS BEFORE ISSUING A FINAL NEGATIVE REPORT     Performed at [facility]   Report Status PENDING   Incomplete  CULTURE, BLOOD (ROUTINE X 2)  Status: None   Collection Time    10/16/13  3:55 PM      Result Value Ref Range Status   Specimen Description BLOOD LEFT HAND    Final   Special Requests BOTTLES DRAWN AEROBIC AND ANAEROBIC 5CC   Final   Culture  Setup Time     Final   Value: 10/16/2013 19:06     Performed at [facility]   Culture     Final   Value:        BLOOD CULTURE RECEIVED NO GROWTH TO DATE CULTURE WILL BE HELD FOR 5 DAYS BEFORE ISSUING A FINAL NEGATIVE REPORT     Performed at [facility]   Report Status PENDING   Incomplete  URINE CULTURE     Status: None   Collection Time    10/16/13  5:49 PM      Result Value Ref Range Status   Specimen Description URINE, RANDOM   Final   Special Requests Immunocompromised   Final   Culture  Setup Time     Final   Value: 10/17/2013 01:16     Performed at [place]     Final   Value: >=100,000 COLONIES/ML     Performed at [facility]   Culture     Final   Value: ESCHERICHIA COLI     Performed at [facility]    Report Status 10/18/2013 FINAL   Final   Organism ID, Bacteria ESCHERICHIA COLI   Final    Studies/Results: No results found.  Assessment: I suspect her febrile illness was due to Escherichia coli pyelonephritis. She is improving. I will increase her trimethoprim sulfamethoxazole to twice daily dosing with food. If she tolerates that overnight she could be discharged home with a 14 day course of therapy before converting back to once daily prophylaxis. I will arrange for her to be seen back in our clinic soon so we can get her back on antiretroviral therapy and other prophylactic medications once her ADAP certification is restored.  Plan: 1. Change trimethoprim-sulfamethoxazole to one double strength tablet twice daily with food 2. Discontinue ceftriaxone   Michel Bickers, MD [facility] for [facility] [place] [facility] [phone] pager   [phone] cell 10/18/2013, 4:26 PM

## 2013-10-18 NOTE — Progress Notes (Addendum)
[facility] PROGRESS NOTE  Beverly [name] DVV:[id number] DOB: May 21, 1981 DOA: 10/16/2013 PCP: No primary provider on file.  Brief narrative: 33 -year-old female with past medical history of HIV/AIDS and most recent CD4 (10/16/13) is less than 10, noncompliance, anxiety, asthma who presented to [facility] ED 10/16/2013 with fever, malaise, cough, flank pain for the past one to 2 weeks prior to this admission. In ED. Patient was found to be febrile, tachycardiac, stable blood pressure. She was started on azithromycin and Rocephin for possible pneumonia. Infectious disease was consulted for input on management. The thought was that patient most likely has UTI/pyelonephritis rather than pneumonia. Azithromycin was stopped and patient is currently only on Rocephin. Per infectious disease, patient was also started on Bactrim for PCP prophylaxis.  Assessment/Plan:   Principal problem: Sepsis secondary to urinary tract infection secondary to Escherichia coli - Patient was tachycardic, febrile on the admission. In addition her urine culture is growing Escherichia coli. - Azithromycin was stopped per infectious disease recommendations. She's only on Rocephin. - Blood culture to date is negative. Chest x-ray did not show evidence of pneumonia.  Active problems: HIV/AIDS - CD4 on 10/16/2013 is less than 10. - Continue Bactrim for PCP prophylaxis Anemia of chronic disease - Secondary to history of HIV/AIDS - Hemoglobin is 7.6 this morning. Would transfuse 1 unit of PRBC today. - Followup post transfusion hemoglobin Neutropenia - Likely bone marrow suppression from HIV/AIDS - Continue to monitor white blood cell count. Hypokalemia - Perhaps because of Bactrim. Replete - Followup BMP in the morning Thrombocytopenia - Discontinue Lovenox and use SCDs while in hospital for DVT prophylaxis  Code Status: Full Code  Family Communication: none at bedside  Disposition Plan: home when stable  Consultants:   ID Dr.Campbell Antibiotics:  Rocephin 10/16/2013 --> Bactrim 10/16/2013 --> Azithromycin 4/27 --> 10/17/2013   Robbie Lis, MD  [facility] Pager [phone]  If 7PM-7AM, please contact night-coverage [email or web address] Password [email or web address] 10/18/2013, 2:25 PM   LOS: 2 days    HPI/Subjective: No acute overnight events.  Objective: Filed Vitals:   10/17/13 1300 10/17/13 2149 10/18/13 0551 10/18/13 1351  BP: 112/68 116/82 130/72 116/76  Pulse: 65 75 62 62  Temp: 98.2 F (36.8 C) 97.8 F (36.6 C) 97.5 F (36.4 C) 97.4 F (36.3 C)  TempSrc: Oral Oral Oral Oral  Resp: 16 15 16 16   Height:      Weight:      SpO2: 99% 100% 99% 100%    Intake/Output Summary (Last 24 hours) at 10/18/13 1425 Last data filed at 10/18/13 1404  Gross per 24 hour  Intake   2329 ml  Output    300 ml  Net   2029 ml    Exam:   General:  Pt is alert, follows commands appropriately, not in acute distress  Cardiovascular: Regular rate and rhythm, S1/S2, no murmurs, no rubs, no gallops  Respiratory: Clear to auscultation bilaterally, no wheezing, no crackles, no rhonchi  Abdomen: Soft, non tender, non distended, bowel sounds present, no guarding  Extremities: No edema, pulses DP and PT palpable bilaterally  Neuro: Grossly nonfocal  Data Reviewed: Basic Metabolic Panel:  Recent Labs Lab 10/16/13 1430 10/17/13 0355 10/18/13 0337  NA 134* 141 139  K 3.3* 3.5* 3.6*  CL 96 107 108  CO2 24 25 23   GLUCOSE 78 95 83  BUN 11 9 6   CREATININE 0.70 0.73 0.64  CALCIUM 8.4 7.3* 7.5*   Liver Function Tests:  Recent Labs Lab 10/16/13 1430  10/18/13 0337  AST 21 17  ALT 9 6  ALKPHOS 111 86  BILITOT 0.7 <0.2*  PROT 8.6* 6.3  ALBUMIN 2.8* 1.9*   No results found for this basename: LIPASE, AMYLASE,  in the last 168 hours No results found for this basename: AMMONIA,  in the last 168 hours CBC:  Recent Labs Lab 10/16/13 1430 10/17/13 0355 10/18/13 0337  WBC 4.7 2.5* 2.0*  NEUTROABS 3.6   --   --   HGB 9.3* 7.7* 7.6*  HCT 29.1* 25.0* 24.0*  MCV 79.3 81.2 80.0  PLT 187 143* 133*   Cardiac Enzymes: No results found for this basename: CKTOTAL, CKMB, CKMBINDEX, TROPONINI,  in the last 168 hours BNP: No components found with this basename: POCBNP,  CBG: No results found for this basename: GLUCAP,  in the last 168 hours  CULTURE, BLOOD (ROUTINE X 2)     Status: None   Collection Time    10/16/13  3:55 PM      Result Value Ref Range Status   Specimen Description BLOOD LEFT HAND   Final   Value:        BLOOD CULTURE RECEIVED NO GROWTH TO DATE      Performed at [facility]   Report Status PENDING   Incomplete  CULTURE, BLOOD (ROUTINE X 2)     Status: None   Collection Time    10/16/13  3:55 PM      Result Value Ref Range Status   Specimen Description BLOOD LEFT HAND    Final   Value:        BLOOD CULTURE RECEIVED NO GROWTH TO DATE      Performed at [facility]   Report Status PENDING   Incomplete  URINE CULTURE     Status: None   Collection Time    10/16/13  5:49 PM      Result Value Ref Range Status   Specimen Description URINE, RANDOM   Final   Value: ESCHERICHIA COLI     Performed at [facility]   Report Status 10/18/2013 FINAL   Final   Organism ID, Bacteria ESCHERICHIA COLI   Final     Studies: No results found.  Scheduled Meds: . cefTRIAXone   1 g Intravenous Q24H  . enoxaparin (LOVENOX)   40 mg Subcutaneous Q24H  . feeding supplement   1 Container Oral BID BM  . sulfamethoxazole-trimet  1 tablet Oral Daily   Continuous Infusions: . sodium chloride 100 mL/hr at 10/17/13 1635

## 2013-10-19 DIAGNOSIS — R6510 Systemic inflammatory response syndrome (SIRS) of non-infectious origin without acute organ dysfunction: Secondary | ICD-10-CM

## 2013-10-19 LAB — CBC
HCT: 27.9 % — ABNORMAL LOW (ref 36.0–46.0)
Hemoglobin: 8.7 g/dL — ABNORMAL LOW (ref 12.0–15.0)
MCH: 25.2 pg — ABNORMAL LOW (ref 26.0–34.0)
MCHC: 31.2 g/dL (ref 30.0–36.0)
MCV: 80.9 fL (ref 78.0–100.0)
Platelets: 142 10*3/uL — ABNORMAL LOW (ref 150–400)
RBC: 3.45 MIL/uL — AB (ref 3.87–5.11)
RDW: 15 % (ref 11.5–15.5)
WBC: 2.2 10*3/uL — ABNORMAL LOW (ref 4.0–10.5)

## 2013-10-19 LAB — TYPE AND SCREEN
ABO/RH(D): A POS
Antibody Screen: NEGATIVE
Unit division: 0

## 2013-10-19 MED ORDER — POTASSIUM CHLORIDE CRYS ER 20 MEQ PO TBCR
40.0000 meq | EXTENDED_RELEASE_TABLET | Freq: Once | ORAL | Status: AC
  Administered 2013-10-19: 40 meq via ORAL
  Filled 2013-10-19: qty 2

## 2013-10-19 MED ORDER — SULFAMETHOXAZOLE-TMP DS 800-160 MG PO TABS: 1.0000 | ORAL_TABLET | Freq: Two times a day (BID) | ORAL | Status: DC

## 2013-10-19 NOTE — Progress Notes (Signed)
Patient received 330 mls of packed RBC.  She tolerated the infusion well with no complaints.  Time ended was 0230 with a total of 330 mls of RBCs infused.

## 2013-10-19 NOTE — Progress Notes (Signed)
Nursing Discharge Summary  Patient ID: Beverly [name] MRN: [id number] DOB/AGE: 02-28-1981 33 y.o.  Admit date: 10/16/2013 Discharge date: 10/19/2013  Discharged Condition: good  Disposition: 01-Home or Self Care  Follow-up Information   Follow up with [facility]     On 11/08/2013. (@ 12pm with Dr.Devine)    Contact information:   [place] [address] [phone]      Follow up with Carlyle Basques, MD On 10/31/2013. (@ 2pm with Dr. Baxter Flattery)    Specialty:  Infectious Diseases   Contact information:   [place] [address] [phone]       Prescriptions Given: Prescription for Bactrim given. Follow up appointments discussed. Patient had no further questions.   Means of Discharge: Patient will be discharged home via private vehicle.   Signed: Buel Ream 10/19/2013, 1:19 PM

## 2013-10-19 NOTE — Discharge Instructions (Signed)

## 2013-10-19 NOTE — Care Management Note (Signed)
Cm spoke with patient at the bedside concerning discharge planning. No PCP on record. Pt provided with information concerning [facility] and other community resources for self-pay pt's. Per pt's consent follow up appointments scheduled at [facility] and Infectious Disease. Appointment placed on AVS. Pt able to afford medications at discharge. No other needs assessed at this time.    Venita Lick Avynn Klassen,MSN,RN [phone]

## 2013-10-19 NOTE — Discharge Summary (Signed)
Physician Discharge Summary  Beverly [name] LZJ:[id number] DOB: 06/12/1981 DOA: 10/16/2013  PCP: No primary provider on file.  Admit date: 10/16/2013 Discharge date: 10/19/2013  Recommendations for Outpatient Follow-up:  1. Patient will continue Bactrim double strength twice daily for 14 days and then once a day for PCP prophylaxis. She will followup with infectious disease per scheduled appointment and will be started on anti-retroviral medications outpatient.  Discharge Diagnoses:  Principal Problem:   SIRS (systemic inflammatory response syndrome) Active Problems:   HIV INFECTION   Anxiety state, unspecified   Pyelonephritis   Protein-calorie malnutrition, severe   Depression   Anemia   Thrombocytopenia   Cigarette smoker   Asthma    Discharge Condition: Medically stable for discharge home today  Diet recommendation: As tolerated  History of present illness:  33 -year-old female with past medical history of HIV/AIDS and most recent CD4 (10/16/13) is less than 10, noncompliance, anxiety, asthma who presented to [facility] ED 10/16/2013 with fever, malaise, cough, flank pain for the past one to 2 weeks prior to this admission. In ED. Patient was found to be febrile, tachycardiac, stable blood pressure. She was started on azithromycin and Rocephin for possible pneumonia. Infectious disease was consulted for input on management. The thought was that patient most likely has UTI/pyelonephritis rather than pneumonia. Azithromycin was stopped and patient is currently only on Rocephin. Per infectious disease, patient was also started on Bactrim for PCP prophylaxis.   Assessment/Plan:   Principal problem:  Sepsis secondary to urinary tract infection secondary to Escherichia coli  - Patient was tachycardic, febrile on the admission. In addition her urine culture is growing Escherichia coli.  - Azithromycin was stopped per infectious disease recommendations. She's only on Rocephin and this too was  stopped because urine culture growing Escherichia coli sensitive to Bactrim. - Blood culture to date is negative. Chest x-ray did not show evidence of pneumonia.  Active problems:  HIV/AIDS  - CD4 on 10/16/2013 is less than 10.  - Continue Bactrim for UTI initially, for 14 days twice daily. Then once a day regimen for PCP prophylaxis - She will be started on anti-retroviral medication on outpatient basis Anemia of chronic disease  - Secondary to history of HIV/AIDS  - Hemoglobin was 7.6 and then status post 1 unit PRBC transfusion her hemoglobin is 8.7 Neutropenia  - Likely bone marrow suppression from HIV/AIDS  - Continue to monitor white blood cell count.  Hypokalemia  - Perhaps because of Bactrim. Repleted. We will give 1 dose of by mouth potassium prior to discharge. Thrombocytopenia  - Discontinued Lovenox and used SCDs while in hospital for DVT prophylaxis    Code Status: Full Code  Family Communication: none at bedside   Consultants:  ID Dr.Campbell Antibiotics:  Rocephin 10/16/2013 --> 10/18/2013 Bactrim 10/16/2013 --> will be continued for 14 days twice daily and in once a day from then on Azithromycin 4/27 --> 10/17/2013    Signed:  Robbie Lis, MD  [facility] 10/19/2013, 12:50 PM  Pager #: [phone]   Discharge Exam: Filed Vitals:   10/19/13 0635  BP: 131/87  Pulse: 67  Temp: 97.8 F (36.6 C)  Resp: 16   Filed Vitals:   10/19/13 0119 10/19/13 0204 10/19/13 0236 10/19/13 0635  BP: 119/72 119/75 106/67 131/87  Pulse: 78 79 77 67  Temp: 97.5 F (36.4 C) 97.6 F (36.4 C) 98.1 F (36.7 C) 97.8 F (36.6 C)  TempSrc: Oral Oral Oral Oral  Resp: 18 18 18  16  Height:      Weight:      SpO2: 100% 100% 100% 99%    General: Pt is alert, follows commands appropriately, not in acute distress Cardiovascular: Regular rate and rhythm, S1/S2 +, no murmurs, no rubs, no gallops Respiratory: Clear to auscultation bilaterally, no wheezing, no crackles, no  rhonchi Abdominal: Soft, non tender, non distended, bowel sounds +, no guarding Extremities: no edema, no cyanosis, pulses palpable bilaterally DP and PT Neuro: Grossly nonfocal  Discharge Instructions  Discharge Orders   Future Appointments Provider Department Dept Phone   10/31/2013 2:00 PM Carlyle Basques, MD [facility] for Infectious Disease [phone]   11/08/2013 12:00 PM [facility] Covering Provider [place] [phone]   Future Orders Complete By Expires   Call MD for:  difficulty breathing, headache or visual disturbances  As directed    Call MD for:  persistant dizziness or light-headedness  As directed    Call MD for:  persistant nausea and vomiting  As directed    Call MD for:  redness, tenderness, or signs of infection (pain, swelling, redness, odor or green/yellow discharge around incision site)  As directed    Diet - low sodium heart healthy  As directed    Discharge instructions  As directed    Increase activity slowly  As directed        Medication List    STOP taking these medications       Darunavir Ethanolate 800 MG tablet  Commonly known as:  PREZISTA     DAYQUIL PO     emtricitabine-tenofovir 200-300 MG per tablet  Commonly known as:  TRUVADA     ritonavir 100 MG capsule  Commonly known as:  NORVIR      TAKE these medications       ibuprofen 200 MG tablet  Commonly known as:  ADVIL,MOTRIN  Take 400 mg by mouth every 6 (six) hours as needed (for pain).     sulfamethoxazole-trimethoprim 800-160 MG per tablet  Commonly known as:  BACTRIM DS  Take 1 tablet by mouth 2 (two) times daily.           Follow-up Information   Follow up with [facility]     On 11/08/2013. (@ 12pm with Dr.Jennessy Sandridge)    Contact information:   [place] [address] [phone]      Follow up with Carlyle Basques, MD On 10/31/2013. (@ 2pm with Dr. Baxter Flattery)    Specialty:   Infectious Diseases   Contact information:   [place] [address] [phone]        The results of significant diagnostics from this hospitalization (including imaging, microbiology, ancillary and laboratory) are listed below for reference.    Significant Diagnostic Studies: Dg Chest 2 View  10/16/2013   EXAM: CHEST  2 VIEW  COMPARISON:  None.  FINDINGS: The heart size and mediastinal contours are within normal limits. Both lungs are clear. The visualized skeletal structures are unremarkable.  IMPRESSION: No active cardiopulmonary disease.   Electronically Signed   By: Marcello Moores  Register   On: 10/16/2013 14:13    Microbiology: Recent Results (from the past 240 hour(s))  CULTURE, BLOOD (ROUTINE X 2)     Status: None   Collection Time    10/16/13  3:55 PM      Result Value Ref Range Status   Specimen Description BLOOD LEFT HAND   Final  Special Requests BOTTLES DRAWN AEROBIC AND ANAEROBIC 5CC    Final   Culture  Setup Time     Final   Value: 10/16/2013 19:06     Performed at [facility]   Culture     Final   Value:        BLOOD CULTURE RECEIVED NO GROWTH TO DATE CULTURE WILL BE HELD FOR 5 DAYS BEFORE ISSUING A FINAL NEGATIVE REPORT     Performed at [facility]   Report Status PENDING   Incomplete  CULTURE, BLOOD (ROUTINE X 2)     Status: None   Collection Time    10/16/13  3:55 PM      Result Value Ref Range Status   Specimen Description BLOOD LEFT HAND    Final   Special Requests BOTTLES DRAWN AEROBIC AND ANAEROBIC 5CC   Final   Culture  Setup Time     Final   Value: 10/16/2013 19:06     Performed at [facility]   Culture     Final   Value:        BLOOD CULTURE RECEIVED NO GROWTH TO DATE CULTURE WILL BE HELD FOR 5 DAYS BEFORE ISSUING A FINAL NEGATIVE REPORT     Performed at [facility]   Report Status PENDING   Incomplete  URINE CULTURE     Status: None   Collection Time    10/16/13  5:49 PM      Result  Value Ref Range Status   Specimen Description URINE, RANDOM   Final   Special Requests Immunocompromised   Final   Culture  Setup Time     Final   Value: 10/17/2013 01:16     Performed at [facility] Count     Final   Value: >=100,000 COLONIES/ML     Performed at [facility]   Culture     Final   Value: ESCHERICHIA COLI     Performed at [facility]   Report Status 10/18/2013 FINAL   Final   Organism ID, Bacteria ESCHERICHIA COLI   Final     Labs: Basic Metabolic Panel:  Recent Labs Lab 10/16/13 1430 10/17/13 0355 10/18/13 0337  NA 134* 141 139  K 3.3* 3.5* 3.6*  CL 96 107 108  CO2 24 25 23   GLUCOSE 78 95 83  BUN 11 9 6   CREATININE 0.70 0.73 0.64  CALCIUM 8.4 7.3* 7.5*   Liver Function Tests:  Recent Labs Lab 10/16/13 1430 10/18/13 0337  AST 21 17  ALT 9 6  ALKPHOS 111 86  BILITOT 0.7 <0.2*  PROT 8.6* 6.3  ALBUMIN 2.8* 1.9*   No results found for this basename: LIPASE, AMYLASE,  in the last 168 hours No results found for this basename: AMMONIA,  in the last 168 hours CBC:  Recent Labs Lab 10/16/13 1430 10/17/13 0355 10/18/13 0337 10/19/13 0402  WBC 4.7 2.5* 2.0* 2.2*  NEUTROABS 3.6  --   --   --   HGB 9.3* 7.7* 7.6* 8.7*  HCT 29.1* 25.0* 24.0* 27.9*  MCV 79.3 81.2 80.0 80.9  PLT 187 143* 133* 142*   Cardiac Enzymes: No results found for this basename: CKTOTAL, CKMB, CKMBINDEX, TROPONINI,  in the last 168 hours BNP: BNP (last 3 results) No results found for this basename: PROBNP,  in the last 8760 hours CBG: No results found for this basename: GLUCAP,  in the last 168 hours  Time coordinating  discharge: Over 30 minutes

## 2013-10-23 LAB — CULTURE, BLOOD (ROUTINE X 2)
Culture: NO GROWTH
Culture: NO GROWTH

## 2013-10-27 NOTE — ED Provider Notes (Signed)
CSN: [id number]     Arrival date & time 10/16/13  1247 History   First MD Initiated Contact with Patient 10/16/13 1455     Chief Complaint  Patient presents with  . Cough  . Nasal Congestion     (Consider location/radiation/quality/duration/timing/severity/associated sxs/prior Treatment) HPI  Beverly [name] is a 33 y.o. female with PMH of HIV/AIDs, non compliance, Anxiety, Asthma, presents to the ER with the above complaint.  She has not taken her HIV meds or had any follow up for this in over 2 years.  For the last 1-2weeks, reports generalized malaise, cough, congestion, productive greenish sputum, fevers and chills.  In addition, also reports foul smelling, darker urine and bilateral flank pain for 1 week.   Past Medical History  Diagnosis Date  . Anxiety   . Depression   . HIV infection   . Asthma    Past Surgical History  Procedure Laterality Date  . Cesarean section     Family History  Problem Relation Age of Onset  . Hypertension Other   . Cancer Other   . Diabetes Other   . Asthma Other    History  Substance Use Topics  . Smoking status: Current Every Day Smoker -- 0.30 packs/day for 3 years    Types: Cigarettes  . Smokeless tobacco: Never Used  . Alcohol Use: Yes     Comment: occasional   OB History   Grav Para Term Preterm Abortions TAB SAB Ect Mult Living                 Review of Systems  All systems reviewed and negative, other than as noted in HPI.   Allergies  Morphine and related  Home Medications   Prior to Admission medications   Medication Sig Start Date End Date Taking? Authorizing Provider  ibuprofen (ADVIL,MOTRIN) 200 MG tablet Take 400 mg by mouth every 6 (six) hours as needed (for pain).    Yes Historical Provider, MD  sulfamethoxazole-trimethoprim (BACTRIM DS) 800-160 MG per tablet Take 1 tablet by mouth 2 (two) times daily. 10/19/13   Robbie Lis, MD   BP 131/87  Pulse 67  Temp(Src) 97.8 F (36.6 C) (Oral)  Resp 16  Ht  5\' 1"  (1.549 m)  Wt 149 lb 9.6 oz (67.858 kg)  BMI 28.28 kg/m2  SpO2 99%  LMP 09/22/2013 Physical Exam  Nursing note and vitals reviewed. Constitutional:  Laying in bed. Tired appearing, but not toxic.   HENT:  Head: Normocephalic and atraumatic.  Eyes: Conjunctivae are normal. Right eye exhibits no discharge. Left eye exhibits no discharge.  Neck: Neck supple.  Cardiovascular: Regular rhythm and normal heart sounds.  Exam reveals no gallop and no friction rub.   No murmur heard. tachycardic  Pulmonary/Chest: Effort normal and breath sounds normal. No respiratory distress.  Abdominal: Soft. She exhibits no distension. There is no tenderness.  Musculoskeletal: She exhibits no edema and no tenderness.  Neurological: She is alert.  Skin: Skin is warm and dry. She is not diaphoretic.  Psychiatric: She has a normal mood and affect. Her behavior is normal. Thought content normal.    ED Course  Procedures (including critical care time) Labs Review Labs Reviewed  CBC WITH DIFFERENTIAL - Abnormal; Notable for the following:    RBC 3.67 (*)    Hemoglobin 9.3 (*)    HCT 29.1 (*)    MCH 25.3 (*)    RDW 15.6 (*)    Lymphs Abs 0.6 (*)  All other components within normal limits  COMPREHENSIVE METABOLIC PANEL - Abnormal; Notable for the following:    Sodium 134 (*)    Potassium 3.3 (*)    Total Protein 8.6 (*)    Albumin 2.8 (*)    All other components within normal limits  URINALYSIS, ROUTINE W REFLEX MICROSCOPIC - Abnormal; Notable for the following:    APPearance CLOUDY (*)    Hgb urine dipstick TRACE (*)    Protein, ur 30 (*)    Nitrite POSITIVE (*)    Leukocytes, UA LARGE (*)    All other components within normal limits  T-HELPER CELLS (CD4) COUNT - Abnormal; Notable for the following:    CD4 T Cell Abs <10 (*)    CD4 % Helper T Cell <1 (*)    All other components within normal limits  HIV 1 RNA QUANT-NO REFLEX-BLD - Abnormal; Notable for the following:    HIV 1 RNA Quant  84738 (*)    HIV1 RNA Quant, Log 4.93 (*)    All other components within normal limits  URINE MICROSCOPIC-ADD ON - Abnormal; Notable for the following:    Squamous Epithelial / LPF MANY (*)    Bacteria, UA MANY (*)    All other components within normal limits  CBC - Abnormal; Notable for the following:    WBC 2.5 (*)    RBC 3.08 (*)    Hemoglobin 7.7 (*)    HCT 25.0 (*)    MCH 25.0 (*)    RDW 15.6 (*)    Platelets 143 (*)    All other components within normal limits  BASIC METABOLIC PANEL - Abnormal; Notable for the following:    Potassium 3.5 (*)    Calcium 7.3 (*)    All other components within normal limits  IRON AND TIBC - Abnormal; Notable for the following:    Iron 13 (*)    TIBC 177 (*)    Saturation Ratios 7 (*)    All other components within normal limits  RETICULOCYTES - Abnormal; Notable for the following:    RBC. 2.98 (*)    Retic Count, Manual 11.9 (*)    All other components within normal limits  CBC - Abnormal; Notable for the following:    WBC 2.0 (*)    RBC 3.00 (*)    Hemoglobin 7.6 (*)    HCT 24.0 (*)    MCH 25.3 (*)    RDW 15.7 (*)    Platelets 133 (*)    All other components within normal limits  COMPREHENSIVE METABOLIC PANEL - Abnormal; Notable for the following:    Potassium 3.6 (*)    Calcium 7.5 (*)    Albumin 1.9 (*)    Total Bilirubin <0.2 (*)    All other components within normal limits  CBC - Abnormal; Notable for the following:    WBC 2.2 (*)    RBC 3.45 (*)    Hemoglobin 8.7 (*)    HCT 27.9 (*)    MCH 25.2 (*)    Platelets 142 (*)    All other components within normal limits  CULTURE, BLOOD (ROUTINE X 2)  CULTURE, BLOOD (ROUTINE X 2)  URINE CULTURE  CULTURE, EXPECTORATED SPUTUM-ASSESSMENT  PREGNANCY, URINE  LACTIC ACID, PLASMA  INFLUENZA PANEL BY PCR (TYPE A & B, H1N1)  VITAMIN B12  FOLATE  FERRITIN  PREPARE RBC (CROSSMATCH)  TYPE AND SCREEN  ABO/RH    Imaging Review No results found.   EKG Interpretation None  MDM   Final diagnoses:  Sepsis  UTI (urinary tract infection)  HIV (human immunodeficiency virus infection)  History of medication noncompliance        Virgel Manifold, MD 10/27/13 2200

## 2013-10-31 ENCOUNTER — Inpatient Hospital Stay: Payer: Self-pay | Admitting: Internal Medicine

## 2013-11-08 ENCOUNTER — Inpatient Hospital Stay: Payer: Self-pay

## 2013-12-08 ENCOUNTER — Telehealth: Payer: Self-pay | Admitting: *Deleted

## 2013-12-08 NOTE — Telephone Encounter (Signed)
Needing Lab, MD and PAP smear appts.  Last appt. Januarly 2013.  Pt lost to care.[facility]everly [name] referral.

## 2014-01-19 ENCOUNTER — Emergency Department (HOSPITAL_COMMUNITY)
Admission: EM | Admit: 2014-01-19 | Discharge: 2014-01-19 | Disposition: A | Discharge status: Home / Self Care | Payer: Medicaid Other | Attending: Emergency Medicine | Admitting: Emergency Medicine

## 2014-01-19 ENCOUNTER — Encounter (HOSPITAL_COMMUNITY): Payer: Self-pay | Admitting: Emergency Medicine

## 2014-01-19 DIAGNOSIS — R110 Nausea: Secondary | ICD-10-CM | POA: Diagnosis not present

## 2014-01-19 DIAGNOSIS — F17200 Nicotine dependence, unspecified, uncomplicated: Secondary | ICD-10-CM | POA: Diagnosis not present

## 2014-01-19 DIAGNOSIS — Z8659 Personal history of other mental and behavioral disorders: Secondary | ICD-10-CM | POA: Diagnosis not present

## 2014-01-19 DIAGNOSIS — Z21 Asymptomatic human immunodeficiency virus [HIV] infection status: Secondary | ICD-10-CM | POA: Insufficient documentation

## 2014-01-19 DIAGNOSIS — L089 Local infection of the skin and subcutaneous tissue, unspecified: Secondary | ICD-10-CM | POA: Insufficient documentation

## 2014-01-19 DIAGNOSIS — L0291 Cutaneous abscess, unspecified: Secondary | ICD-10-CM

## 2014-01-19 DIAGNOSIS — L03319 Cellulitis of trunk, unspecified: Principal | ICD-10-CM

## 2014-01-19 DIAGNOSIS — J45909 Unspecified asthma, uncomplicated: Secondary | ICD-10-CM | POA: Insufficient documentation

## 2014-01-19 DIAGNOSIS — L02219 Cutaneous abscess of trunk, unspecified: Secondary | ICD-10-CM | POA: Insufficient documentation

## 2014-01-19 HISTORY — DX: Cutaneous abscess, unspecified: L02.91

## 2014-01-19 LAB — CBC WITH DIFFERENTIAL/PLATELET
Basophils Absolute: 0 10*3/uL (ref 0.0–0.1)
Basophils Relative: 0 % (ref 0–1)
EOS ABS: 0.1 10*3/uL (ref 0.0–0.7)
Eosinophils Relative: 2 % (ref 0–5)
HCT: 30.3 % — ABNORMAL LOW (ref 36.0–46.0)
Hemoglobin: 10.2 g/dL — ABNORMAL LOW (ref 12.0–15.0)
LYMPHS ABS: 0.4 10*3/uL — AB (ref 0.7–4.0)
LYMPHS PCT: 10 % — AB (ref 12–46)
MCH: 27.8 pg (ref 26.0–34.0)
MCHC: 33.7 g/dL (ref 30.0–36.0)
MCV: 82.6 fL (ref 78.0–100.0)
Monocytes Absolute: 0.3 10*3/uL (ref 0.1–1.0)
Monocytes Relative: 8 % (ref 3–12)
NEUTROS ABS: 3.3 10*3/uL (ref 1.7–7.7)
Neutrophils Relative %: 80 % — ABNORMAL HIGH (ref 43–77)
PLATELETS: 139 10*3/uL — AB (ref 150–400)
RBC: 3.67 MIL/uL — AB (ref 3.87–5.11)
RDW: 15.8 % — ABNORMAL HIGH (ref 11.5–15.5)
WBC: 4.2 10*3/uL (ref 4.0–10.5)

## 2014-01-19 LAB — BASIC METABOLIC PANEL
Anion gap: 12 (ref 5–15)
BUN: 12 mg/dL (ref 6–23)
CO2: 22 mEq/L (ref 19–32)
Calcium: 8.1 mg/dL — ABNORMAL LOW (ref 8.4–10.5)
Chloride: 103 mEq/L (ref 96–112)
Creatinine, Ser: 0.58 mg/dL (ref 0.50–1.10)
GFR calc Af Amer: >90 mL/min (ref >90)
GLUCOSE: 85 mg/dL (ref 70–99)
POTASSIUM: 3.2 meq/L — AB (ref 3.7–5.3)
SODIUM: 137 meq/L (ref 137–147)

## 2014-01-19 MED ORDER — HYDROMORPHONE HCL PF 1 MG/ML IJ SOLN
0.5000 mg | Freq: Once | INTRAMUSCULAR | Status: AC
  Administered 2014-01-19: 0.5 mg via INTRAVENOUS
  Filled 2014-01-19: qty 1

## 2014-01-19 MED ORDER — ONDANSETRON HCL 4 MG/2ML IJ SOLN
4.0000 mg | Freq: Once | INTRAMUSCULAR | Status: AC
  Administered 2014-01-19: 4 mg via INTRAVENOUS
  Filled 2014-01-19: qty 2

## 2014-01-19 MED ORDER — LIDOCAINE HCL 1 % IJ SOLN
INTRAMUSCULAR | Status: AC
  Administered 2014-01-19: 20 mL
  Filled 2014-01-19: qty 20

## 2014-01-19 MED ORDER — OXYCODONE-ACETAMINOPHEN 5-325 MG PO TABS: 1.0000 | ORAL_TABLET | Freq: Four times a day (QID) | ORAL | Status: DC | PRN

## 2014-01-19 MED ORDER — OXYCODONE-ACETAMINOPHEN 5-325 MG PO TABS
2.0000 | ORAL_TABLET | Freq: Once | ORAL | Status: AC
  Administered 2014-01-19: 2 via ORAL
  Filled 2014-01-19: qty 2

## 2014-01-19 MED ORDER — SULFAMETHOXAZOLE-TRIMETHOPRIM 800-160 MG PO TABS: 1.0000 | ORAL_TABLET | Freq: Two times a day (BID) | ORAL | Status: DC

## 2014-01-19 MED ORDER — CEPHALEXIN 500 MG PO CAPS: 500.0000 mg | ORAL_CAPSULE | Freq: Four times a day (QID) | ORAL | Status: DC

## 2014-01-19 NOTE — Discharge Instructions (Signed)

## 2014-01-19 NOTE — ED Notes (Signed)
Patient presents today with a chief complaint of abscess to left groin x 3 days with worsening pain last night. Patient reports the abscess ruptured yesterday which released a lot of puss and states the pain now is unbearable. Patient also reports history of same.

## 2014-01-19 NOTE — ED Provider Notes (Signed)
CSN: [id number]     Arrival date & time 01/19/14  1146 History   First MD Initiated Contact with Patient 01/19/14 1229   This chart was scribed for non-physician practitioner Andreana Klingerman Camprubi-Soms PA-C, working with Ephraim Hamburger, MD by Rosary Lively, ED scribe. This patient was seen in room WTR6/WTR6 and the patient's care was started at 12:30 PM.  MSE was initiated and I personally evaluated the patient and placed orders (if any) at  1:08 PM on January 19, 2014.  The patient appears stable so that the remainder of the MSE may be completed by another provider.   Chief Complaint  Patient presents with  . Recurrent Skin Infections   Patient is a 33 y.o. female presenting with abscess. The history is provided by the patient. No language interpreter was used.  Abscess Abscess location: Left groin. Abscess quality: draining, painful and warmth   Red streaking: no   Duration:  3 days Progression:  Worsening Pain details:    Severity:  Severe   Timing:  Constant   Progression:  Worsening Chronicity:  Recurrent Relieved by:  Nothing Ineffective treatments:  Warm compresses Associated symptoms: fever and nausea    HPI Comments:  KASSADY LABOY is a 33 y.o. female who presents to the Emergency Department complaining of an abscess to the left groin, onset 3 days ago, with 10/10 sharp pain constantly. Pt states that she has tried toothpaste and hot compresses to attempt to alleviate symptoms, without relief. Pt states that the abscess did begin to drain by itself, purulent discharge, however she still notes warmth, increase in size, and pain to the area that radiates to the abdomen and down her left leg. Pt also reports associated symptoms of nausea. Pt reports that she is able to ambulate, however bearing weight and increased activity is extremely difficult and exacerbates symptoms.  Pt denies streaking in the region of complaint, hematuria, or dysuria. Pt states that she does have a history of  HIV, but has not been diagnosed within the AIDS spectrum. Pt reports that she is not currently taking any type of medication for HIV, stating that her meds are currently being changed. Pt states she also has a history of anxiety, depression, anemia and boils.    Past Medical History  Diagnosis Date  . Anxiety   . Depression   . HIV infection   . Asthma   . Abscess    Past Surgical History  Procedure Laterality Date  . Cesarean section     Family History  Problem Relation Age of Onset  . Hypertension Other   . Cancer Other   . Diabetes Other   . Asthma Other    History  Substance Use Topics  . Smoking status: Current Every Day Smoker -- 0.30 packs/day for 3 years    Types: Cigarettes  . Smokeless tobacco: Never Used  . Alcohol Use: Yes     Comment: occasional   OB History   Grav Para Term Preterm Abortions TAB SAB Ect Mult Living                 Review of Systems  Constitutional: Positive for fever.  Gastrointestinal: Positive for nausea.  Genitourinary: Negative for dysuria and hematuria.  Skin:       Abscess to left groin      Allergies  Morphine and related  Home Medications   Prior to Admission medications   Medication Sig Start Date End Date Taking? Authorizing Provider  ibuprofen (ADVIL,MOTRIN)  200 MG tablet Take 400 mg by mouth every 6 (six) hours as needed (for pain).    Yes Historical Provider, MD   BP 123/97  Pulse 95  Temp(Src) 99.2 F (37.3 C) (Oral)  Resp 20  SpO2 100%  LMP 01/08/2014 Physical Exam  Nursing note and vitals reviewed. Constitutional: She is oriented to person, place, and time. She appears well-developed and well-nourished. She appears distressed.  Rectal temp 101.2, appears distressed, tearful  HENT:  Head: Normocephalic and atraumatic.  Mouth/Throat: Mucous membranes are normal.  Eyes: Conjunctivae and EOM are normal.  Neck: Normal range of motion. Neck supple.  Cardiovascular: Normal rate, regular rhythm and normal  heart sounds.   No murmur heard. Pulmonary/Chest: Effort normal and breath sounds normal. No respiratory distress. She has no decreased breath sounds. She has no wheezes. She has no rhonchi. She has no rales.  Abdominal: Soft. Normal appearance and bowel sounds are normal. She exhibits no distension. There is tenderness. There is no rigidity, no rebound and no guarding.  Mild suprapubic discomfort near inguinal fold on L side, +BS throughout, no distension, no r/g/r  Genitourinary: Pelvic exam was performed with patient prone. There is tenderness on the left labia.  L inguinal abscess, exquisitely tender to palpation, indurated and mildly erythematous, warm to the touch, with central fluctuance. Area approx 10cm in diameter.  Musculoskeletal: Normal range of motion.  Neurological: She is alert and oriented to person, place, and time.  Skin: Skin is warm and dry. Lesion noted.  L groin abscess as described above  Psychiatric: She has a normal mood and affect. Her behavior is normal.    ED Course  Procedures  DIAGNOSTIC STUDIES: Oxygen Saturation is 100% on RA, normal by my interpretation.  COORDINATION OF CARE: 12:41 PM-Discussed treatment plan which includes checking rectal temperature, potential incision and drainage, along with pain medication with pt at bedside and pt agreed to plan.   EKG Interpretation None      MDM   Pt's rectal temp reveals 101.2, with pulse of 95, in an immunocompromised pt. Will transfer care to the back at this time, given that the pt meets SIRS criteria.     Patty Sermons [place], [place] 01/19/14 1556

## 2014-01-19 NOTE — ED Provider Notes (Signed)
CSN: [id number]     Arrival date & time 01/19/14  1146 History   First MD Initiated Contact with Patient 01/19/14 1229   This chart was scribed for non-physician practitioner Oluwatobiloba Martin Camprubi-Soms PA-C, working with Ephraim Hamburger, MD by Rosary Lively, ED scribe. This patient was seen in room WTR6/WTR6 and the patient's care was started at 12:30 PM.  MSE was initiated and I personally evaluated the patient and placed orders (if any) at  1:08 PM on January 19, 2014.  The patient appears stable so that the remainder of the MSE may be completed by another provider.   Chief Complaint  Patient presents with  . Recurrent Skin Infections   Patient is a 33 y.o. female presenting with abscess. The history is provided by the patient. No language interpreter was used.  Abscess Abscess location: Left groin. Abscess quality: draining, painful and warmth   Red streaking: no   Duration:  3 days Progression:  Worsening Pain details:    Severity:  Severe   Timing:  Constant   Progression:  Worsening Chronicity:  Recurrent Relieved by:  Nothing Ineffective treatments:  Warm compresses Associated symptoms: fever and nausea    HPI Comments:  Beverly [name] is a 33 y.o. female who presents to the Emergency Department complaining of an abscess to the left groin, onset 3 days ago, with 10/10 sharp pain constantly. Pt states that she has tried toothpaste and hot compresses to attempt to alleviate symptoms, without relief. Pt states that the abscess did begin to drain by itself, purulent discharge, however she still notes warmth, increase in size, and pain to the area that radiates to the abdomen and down her left leg. Pt also reports associated symptoms of nausea. Pt reports that she is able to ambulate, however bearing weight and increased activity is extremely difficult and exacerbates symptoms.  Pt denies streaking in the region of complaint, hematuria, or dysuria. Pt states that she does have a history of  HIV, but has not been diagnosed within the AIDS spectrum. Pt reports that she is not currently taking any type of medication for HIV, stating that her meds are currently being changed. Pt states she also has a history of anxiety, depression, anemia and boils.    Past Medical History  Diagnosis Date  . Anxiety   . Depression   . HIV infection   . Asthma   . Abscess    Past Surgical History  Procedure Laterality Date  . Cesarean section     Family History  Problem Relation Age of Onset  . Hypertension Other   . Cancer Other   . Diabetes Other   . Asthma Other    History  Substance Use Topics  . Smoking status: Current Every Day Smoker -- 0.30 packs/day for 3 years    Types: Cigarettes  . Smokeless tobacco: Never Used  . Alcohol Use: Yes     Comment: occasional    Review of Systems  Constitutional: Positive for fever.  Gastrointestinal: Positive for nausea.  Genitourinary: Negative for dysuria and hematuria.  Skin:       Abscess to left groin      Allergies  Morphine and related  Home Medications   Prior to Admission medications   Medication Sig Start Date End Date Taking? Authorizing Provider  ibuprofen (ADVIL,MOTRIN) 200 MG tablet Take 400 mg by mouth every 6 (six) hours as needed (for pain).    Yes Historical Provider, MD   BP 123/97  Pulse  95  Temp(Src) 99.2 F (37.3 C) (Oral)  Resp 20  SpO2 100%  LMP 01/08/2014 Physical Exam  Nursing note and vitals reviewed. Constitutional: She is oriented to person, place, and time. She appears well-developed and well-nourished. She appears distressed.  Rectal temp 101.2, appears distressed, tearful  HENT:  Head: Normocephalic and atraumatic.  Mouth/Throat: Mucous membranes are normal.  Eyes: Conjunctivae and EOM are normal.  Neck: Normal range of motion. Neck supple.  Cardiovascular: Normal rate, regular rhythm and normal heart sounds.   No murmur heard. Pulmonary/Chest: Effort normal and breath sounds normal.  No respiratory distress. She has no decreased breath sounds. She has no wheezes. She has no rhonchi. She has no rales.  Abdominal: Soft. Normal appearance and bowel sounds are normal. She exhibits no distension. There is tenderness. There is no rigidity, no rebound and no guarding.  Mild suprapubic discomfort near inguinal fold on L side, +BS throughout, no distension, no r/g/r  Genitourinary: Pelvic exam was performed with patient prone. There is tenderness on the left labia.  L inguinal abscess, exquisitely tender to palpation, indurated and mildly erythematous, warm to the touch, with central fluctuance. Area approx 10cm in diameter.  Musculoskeletal: Normal range of motion.  Neurological: She is alert and oriented to person, place, and time.  Skin: Skin is warm and dry. Lesion noted.  L groin abscess as described above  Psychiatric: She has a normal mood and affect. Her behavior is normal.    ED Course  Procedures  DIAGNOSTIC STUDIES: Oxygen Saturation is 100% on RA, normal by my interpretation.  COORDINATION OF CARE: 12:41 PM-Discussed treatment plan which includes checking rectal temperature, potential incision and drainage, along with pain medication with pt at bedside and pt agreed to plan.   MDM  Pt's rectal temp reveals 101.2, with pulse of 95, in an immunocompromised pt. Will transfer care to the back at this time, given that the pt meets SIRS criteria. Pt moved to Room 1 on acute side. Please see next note for further care.    Patty Sermons [place], [place] 01/19/14 1317

## 2014-01-22 NOTE — ED Provider Notes (Signed)
Medical screening examination/treatment/procedure(s) were performed by non-physician practitioner and as supervising physician I was immediately available for consultation/collaboration.   EKG Interpretation None        Ephraim Hamburger, MD 01/22/14 1504

## 2014-01-22 NOTE — ED Provider Notes (Signed)
Medical screening examination/treatment/procedure(s) were conducted as a shared visit with non-physician practitioner(s) and myself.  I personally evaluated the patient during the encounter.   EKG Interpretation None      Patient with history of HIV presents with abscess over the mons pubis. Patient reports history of multiple apices. She states this one has been there for approximately 3 days and is getting larger. It has spontaneously drained. Patient denies any fevers at home but was noted to have a fever in triage. Given her history of HIV, she was transferred back to the acute care side. Patient denies any other systemic symptoms. Physical exam reveals large 4-5 cm fluctuant area over the left mons pubis. No crepitus noted.  No overlying erythema noted. Basic labwork obtained given fever in triage. At this time it appears to be isolated abscess. However, given HIV status, would have low threshold to treat with antibiotics for early cellulitis as well. The abscess was drained at the bedside by PA student under my direct supervision. The drain was placed. Lab work is reassuring. Repeat temperature within normal limits. Will discharge patient home and give antibiotics given HIV status and fever in triage.  Patient was told to return immediately if she has worsening redness, persistent fever, or any new or worsening symptoms.  After history, exam, and medical workup I feel the patient has been appropriately medically screened and is safe for discharge home. Pertinent diagnoses were discussed with the patient. Patient was given return precautions.   INCISION AND DRAINAGE Date/Time: 01/22/2014 7:00 AM Performed by: Thayer Jew, F Authorized by: Thayer Jew, F Consent: Verbal consent obtained. Risks and benefits: risks, benefits and alternatives were discussed Consent given by: patient Patient identity confirmed: verbally with patient Time out: Immediately prior to procedure a "time out"  was called to verify the correct patient, procedure, equipment, support staff and site/side marked as required. Type: abscess Body area: anogenital Anesthesia: local infiltration Anesthetic total: 10 ml Patient sedated: no Scalpel size: 11 Incision type: single straight Complexity: simple Drainage: purulent Drainage amount: moderate Wound treatment: drain placed Packing material: 1/4 in iodoform gauze Patient tolerance: Patient tolerated the procedure well with no immediate complications.    Merryl Hacker, MD 01/22/14 [phone]

## 2014-01-26 ENCOUNTER — Encounter (HOSPITAL_COMMUNITY): Payer: Self-pay | Admitting: Emergency Medicine

## 2014-01-26 ENCOUNTER — Emergency Department (HOSPITAL_COMMUNITY)
Admission: EM | Admit: 2014-01-26 | Discharge: 2014-01-26 | Disposition: A | Discharge status: Home / Self Care | Payer: Medicaid Other | Attending: Emergency Medicine | Admitting: Emergency Medicine

## 2014-01-26 ENCOUNTER — Emergency Department (HOSPITAL_COMMUNITY): Payer: Medicaid Other

## 2014-01-26 DIAGNOSIS — F17200 Nicotine dependence, unspecified, uncomplicated: Secondary | ICD-10-CM | POA: Insufficient documentation

## 2014-01-26 DIAGNOSIS — Z792 Long term (current) use of antibiotics: Secondary | ICD-10-CM | POA: Diagnosis not present

## 2014-01-26 DIAGNOSIS — Z21 Asymptomatic human immunodeficiency virus [HIV] infection status: Secondary | ICD-10-CM | POA: Diagnosis not present

## 2014-01-26 DIAGNOSIS — Z8659 Personal history of other mental and behavioral disorders: Secondary | ICD-10-CM | POA: Insufficient documentation

## 2014-01-26 DIAGNOSIS — J45909 Unspecified asthma, uncomplicated: Secondary | ICD-10-CM | POA: Diagnosis not present

## 2014-01-26 DIAGNOSIS — L0291 Cutaneous abscess, unspecified: Secondary | ICD-10-CM

## 2014-01-26 DIAGNOSIS — L03319 Cellulitis of trunk, unspecified: Principal | ICD-10-CM

## 2014-01-26 DIAGNOSIS — L02219 Cutaneous abscess of trunk, unspecified: Secondary | ICD-10-CM | POA: Insufficient documentation

## 2014-01-26 MED ORDER — OXYCODONE-ACETAMINOPHEN 5-325 MG PO TABS: 1.0000 | ORAL_TABLET | Freq: Four times a day (QID) | ORAL | Status: DC | PRN

## 2014-01-26 MED ORDER — OXYCODONE-ACETAMINOPHEN 5-325 MG PO TABS
1.0000 | ORAL_TABLET | Freq: Once | ORAL | Status: AC
  Administered 2014-01-26: 1 via ORAL
  Filled 2014-01-26: qty 1

## 2014-01-26 NOTE — ED Notes (Signed)
Pt reports being seen in ED on 7/31 for left groin abscess, was told to return on tues or Wednesday to get packing removed, but pt did not have a ride. Reports groin pain 8/10 at present.

## 2014-01-26 NOTE — Discharge Instructions (Signed)
Return here as needed. Follow up with the surgeon provided.  Use warm compresses and warm soaks several times a day.  Keep the area covered and keep the areas clean and dry

## 2014-01-26 NOTE — ED Provider Notes (Signed)
CSN: [id number]     Arrival date & time 01/26/14  1130 History  This chart was scribed for non-physician practitioner Irena Cords, working with Evelina Bucy, MD by Ludger Nutting, ED Scribe. This patient was seen in room WTR8/WTR8 and the patient's care was started at 12:16 PM.    Chief Complaint  Patient presents with  . Abscess  . packing removal     The history is provided by the patient. No language interpreter was used.   HPI Comments: Beverly [name] is a 33 y.o. female with past medical history of HIV who presents to the Emergency Department requesting packing removal from left groin today. Patient had an abscess to the left groin which was I&D'd on 7/31. She was prescribed antibiotics at that time and states she has been compliant with those medications. She reports continued pain and drainage from the affected site. Patient is concerned that she is developing another abscess in the surrounding area.    Past Medical History  Diagnosis Date  . Anxiety   . Depression   . HIV infection   . Asthma   . Abscess    Past Surgical History  Procedure Laterality Date  . Cesarean section     Family History  Problem Relation Age of Onset  . Hypertension Other   . Cancer Other   . Diabetes Other   . Asthma Other    History  Substance Use Topics  . Smoking status: Current Every Day Smoker -- 0.30 packs/day for 3 years    Types: Cigarettes  . Smokeless tobacco: Never Used  . Alcohol Use: Yes     Comment: occasional   OB History   Grav Para Term Preterm Abortions TAB SAB Ect Mult Living                 Review of Systems  A complete 10 system review of systems was obtained and all systems are negative except as noted in the HPI and PMH.    Allergies  Bee venom and Morphine and related  Home Medications   Prior to Admission medications   Medication Sig Start Date End Date Taking? Authorizing Provider  cephALEXin (KEFLEX) 500 MG capsule Take 1 capsule (500 mg total) by  mouth 4 (four) times daily. 01/19/14  Yes Merryl Hacker, MD  ibuprofen (ADVIL,MOTRIN) 200 MG tablet Take 400 mg by mouth every 6 (six) hours as needed (for pain).    Yes Historical Provider, MD  oxyCODONE-acetaminophen (PERCOCET/ROXICET) 5-325 MG per tablet Take 1-2 tablets by mouth every 6 (six) hours as needed for moderate pain or severe pain. 01/19/14  Yes Merryl Hacker, MD  sulfamethoxazole-trimethoprim (SEPTRA DS) 800-160 MG per tablet Take 1 tablet by mouth every 12 (twelve) hours. 01/19/14  Yes Merryl Hacker, MD   BP 128/78  Pulse 97  Temp(Src) 98.7 F (37.1 C) (Oral)  Resp 16  SpO2 99%  LMP 01/08/2014 Physical Exam  Nursing note and vitals reviewed. Constitutional: She is oriented to person, place, and time. She appears well-developed and well-nourished.  HENT:  Head: Normocephalic and atraumatic.  Cardiovascular: Normal rate.   Pulmonary/Chest: Effort normal.  Neurological: She is alert and oriented to person, place, and time.  Skin: Skin is warm and dry.  Swelling to the left mons pubis. Open and draining area with packing in place with smaller, swollen area below.   Psychiatric: She has a normal mood and affect.    ED Course  Procedures (including critical care time)  DIAGNOSTIC STUDIES: Oxygen Saturation is 99% on RA, normal by my interpretation.    COORDINATION OF CARE: 12:22 PM Discussed treatment plan with pt at bedside and pt agreed to plan.  2:01 PM INCISION AND DRAINAGE PROCEDURE NOTE: Patient identification was confirmed and verbal consent was obtained. This procedure was performed by Irena Cords at 2:01 PM. Site: left mons pubis region Sterile procedures observed Needle size: 25 gauge  Anesthetic used (type and amt): 2% lidocaine without epi Blade size: 11 Drainage: small Complexity: Complex Site anesthetized, incision made over site, wound drained and explored loculations, rinsed with copious amounts of normal saline, wound packed with  sterile gauze, covered with dry, sterile dressing.  Pt tolerated procedure well without complications.  Instructions for care discussed verbally and pt provided with additional written instructions for homecare and f/u.        I personally performed the services described in this documentation, which was scribed in my presence. The recorded information has been reviewed and is accurate.    Brent General, PA-C 01/28/14 [phone]

## 2014-01-30 NOTE — ED Provider Notes (Signed)
Medical screening examination/treatment/procedure(s) were conducted as a shared visit with non-physician practitioner(s) and myself.  I personally evaluated the patient during the encounter.   EKG Interpretation None      Patient here with L pubic area abscess, on pubic mons. Previous I&D, on bactrim and keflex. Continued pain. Induration inferior to place of I&D, will extend incision for drainage.  Evelina Bucy, MD 01/30/14 [phone]

## 2014-03-02 ENCOUNTER — Emergency Department (HOSPITAL_COMMUNITY): Payer: Medicaid Other

## 2014-03-02 ENCOUNTER — Encounter (HOSPITAL_COMMUNITY): Payer: Self-pay | Admitting: Emergency Medicine

## 2014-03-02 ENCOUNTER — Emergency Department (HOSPITAL_COMMUNITY)
Admission: EM | Admit: 2014-03-02 | Discharge: 2014-03-02 | Disposition: A | Discharge status: Home / Self Care | Payer: Medicaid Other | Attending: Emergency Medicine | Admitting: Emergency Medicine

## 2014-03-02 DIAGNOSIS — R000 Tachycardia, unspecified: Secondary | ICD-10-CM | POA: Diagnosis not present

## 2014-03-02 DIAGNOSIS — Z792 Long term (current) use of antibiotics: Secondary | ICD-10-CM | POA: Insufficient documentation

## 2014-03-02 DIAGNOSIS — R079 Chest pain, unspecified: Secondary | ICD-10-CM | POA: Insufficient documentation

## 2014-03-02 DIAGNOSIS — F17200 Nicotine dependence, unspecified, uncomplicated: Secondary | ICD-10-CM | POA: Insufficient documentation

## 2014-03-02 DIAGNOSIS — J45909 Unspecified asthma, uncomplicated: Secondary | ICD-10-CM | POA: Diagnosis not present

## 2014-03-02 DIAGNOSIS — Z872 Personal history of diseases of the skin and subcutaneous tissue: Secondary | ICD-10-CM | POA: Insufficient documentation

## 2014-03-02 DIAGNOSIS — F410 Panic disorder [episodic paroxysmal anxiety] without agoraphobia: Secondary | ICD-10-CM | POA: Insufficient documentation

## 2014-03-02 DIAGNOSIS — Z21 Asymptomatic human immunodeficiency virus [HIV] infection status: Secondary | ICD-10-CM | POA: Insufficient documentation

## 2014-03-02 DIAGNOSIS — E876 Hypokalemia: Secondary | ICD-10-CM | POA: Diagnosis not present

## 2014-03-02 LAB — BASIC METABOLIC PANEL
ANION GAP: 15 (ref 5–15)
BUN: 12 mg/dL (ref 6–23)
CO2: 22 meq/L (ref 19–32)
Calcium: 8.5 mg/dL (ref 8.4–10.5)
Chloride: 102 mEq/L (ref 96–112)
Creatinine, Ser: 0.6 mg/dL (ref 0.50–1.10)
GFR calc Af Amer: >90 mL/min (ref >90)
GFR calc non Af Amer: >90 mL/min (ref >90)
Glucose, Bld: 94 mg/dL (ref 70–99)
Potassium: 3.1 mEq/L — ABNORMAL LOW (ref 3.7–5.3)
SODIUM: 139 meq/L (ref 137–147)

## 2014-03-02 LAB — CBC
HCT: 34.4 % — ABNORMAL LOW (ref 36.0–46.0)
Hemoglobin: 11 g/dL — ABNORMAL LOW (ref 12.0–15.0)
MCH: 27 pg (ref 26.0–34.0)
MCHC: 32 g/dL (ref 30.0–36.0)
MCV: 84.3 fL (ref 78.0–100.0)
PLATELETS: 141 10*3/uL — AB (ref 150–400)
RBC: 4.08 MIL/uL (ref 3.87–5.11)
RDW: 15.9 % — ABNORMAL HIGH (ref 11.5–15.5)
WBC: 3.1 10*3/uL — ABNORMAL LOW (ref 4.0–10.5)

## 2014-03-02 LAB — I-STAT TROPONIN, ED: TROPONIN I, POC: 0 ng/mL (ref 0.00–0.08)

## 2014-03-02 MED ORDER — IBUPROFEN 200 MG PO TABS
400.0000 mg | ORAL_TABLET | Freq: Once | ORAL | Status: AC
Start: 2014-03-02 — End: 2014-03-02
  Administered 2014-03-02: 400 mg via ORAL
  Filled 2014-03-02: qty 2

## 2014-03-02 MED ORDER — SODIUM CHLORIDE 0.9 % IV BOLUS (SEPSIS)
1000.0000 mL | Freq: Once | INTRAVENOUS | Status: AC
  Administered 2014-03-02: 1000 mL via INTRAVENOUS

## 2014-03-02 MED ORDER — POTASSIUM CHLORIDE CRYS ER 20 MEQ PO TBCR: 20.0000 meq | EXTENDED_RELEASE_TABLET | Freq: Every day | ORAL | Status: DC

## 2014-03-02 MED ORDER — POTASSIUM CHLORIDE CRYS ER 20 MEQ PO TBCR
40.0000 meq | EXTENDED_RELEASE_TABLET | Freq: Once | ORAL | Status: AC
  Administered 2014-03-02: 40 meq via ORAL
  Filled 2014-03-02: qty 2

## 2014-03-02 NOTE — ED Provider Notes (Signed)
Medical screening examination/treatment/procedure(s) were performed by non-physician practitioner and as supervising physician I was immediately available for consultation/collaboration.    Dot Lanes, MD 03/02/14 [phone]

## 2014-03-02 NOTE — Discharge Instructions (Signed)
Your symptom is likely due to a panic attack.  Read information below.  Your potassium level is low, this may explain your weakness.  Take supplementation as prescribed.  Follow up with your doctor in 1 week for a recheck.   Panic Attacks Panic attacks are sudden, short feelings of great fear or discomfort. You may have them for no reason when you are relaxed, when you are uneasy (anxious), or when you are sleeping.  HOME CARE  Take all your medicines as told.  Check with your doctor before starting new medicines.  Keep all doctor visits. GET HELP IF:  You are not able to take your medicines as told.  Your symptoms do not get better.  Your symptoms get worse. GET HELP RIGHT AWAY IF:  Your attacks seem different than your normal attacks.  You have thoughts about hurting yourself or others.  You take panic attack medicine and you have a side effect. MAKE SURE YOU:  Understand these instructions.  Will watch your condition.  Will get help right away if you are not doing well or get worse. Document Released: 07/11/2010 Document Revised: 03/29/2013 Document Reviewed: 01/20/2013 [facility] Patient Information 2015 [place], [place]. This information is not intended to replace advice given to you by your health care provider. Make sure you discuss any questions you have with your health care provider.  Hypokalemia Hypokalemia means that the amount of potassium in the blood is lower than normal.Potassium is a chemical, called an electrolyte, that helps regulate the amount of fluid in the body. It also stimulates muscle contraction and helps nerves function properly.Most of the body's potassium is inside of cells, and only a very small amount is in the blood. Because the amount in the blood is so small, minor changes can be life-threatening. CAUSES  Antibiotics.  Diarrhea or vomiting.  Using laxatives too much, which can cause diarrhea.  Chronic kidney disease.  Water pills  (diuretics).  Eating disorders (bulimia).  Low magnesium level.  Sweating a lot. SIGNS AND SYMPTOMS  Weakness.  Constipation.  Fatigue.  Muscle cramps.  Mental confusion.  Skipped heartbeats or irregular heartbeat (palpitations).  Tingling or numbness. DIAGNOSIS  Your health care provider can diagnose hypokalemia with blood tests. In addition to checking your potassium level, your health care provider may also check other lab tests. TREATMENT Hypokalemia can be treated with potassium supplements taken by mouth or adjustments in your current medicines. If your potassium level is very low, you may need to get potassium through a vein (IV) and be monitored in the hospital. A diet high in potassium is also helpful. Foods high in potassium are:  Nuts, such as peanuts and pistachios.  Seeds, such as sunflower seeds and pumpkin seeds.  Peas, lentils, and lima beans.  Whole grain and bran cereals and breads.  Fresh fruit and vegetables, such as apricots, avocado, bananas, cantaloupe, kiwi, oranges, tomatoes, asparagus, and potatoes.  Orange and tomato juices.  Red meats.  Fruit yogurt. HOME CARE INSTRUCTIONS  Take all medicines as prescribed by your health care provider.  Maintain a healthy diet by including nutritious food, such as fruits, vegetables, nuts, whole grains, and lean meats.  If you are taking a laxative, be sure to follow the directions on the label. SEEK MEDICAL CARE IF:  Your weakness gets worse.  You feel your heart pounding or racing.  You are vomiting or having diarrhea.  You are diabetic and having trouble keeping your blood glucose in the normal range. SEEK IMMEDIATE  MEDICAL CARE IF:  You have chest pain, shortness of breath, or dizziness.  You are vomiting or having diarrhea for more than 2 days.  You faint. MAKE SURE YOU:   Understand these instructions.  Will watch your condition.  Will get help right away if you are not doing  well or get worse. Document Released: 06/08/2005 Document Revised: 03/29/2013 Document Reviewed: 12/09/2012 [facility] Patient Information 2015 [place], [place]. This information is not intended to replace advice given to you by your health care provider. Make sure you discuss any questions you have with your health care provider.  Potassium Content of Foods Potassium is a mineral found in many foods and drinks. It helps keep fluids and minerals balanced in your body and affects how steadily your heart beats. Potassium also helps control your blood pressure and keep your muscles and nervous system healthy. Certain health conditions and medicines may change the balance of potassium in your body. When this happens, you can help balance your level of potassium through the foods that you do or do not eat. Your health care provider or dietitian may recommend an amount of potassium that you should have each day. The following lists of foods provide the amount of potassium (in parentheses) per serving in each item. HIGH IN POTASSIUM  The following foods and beverages have 200 mg or more of potassium per serving:  Apricots, 2 raw or 5 dry (200 mg).  Artichoke, 1 medium (345 mg).  Avocado, raw,  each (245 mg).  Banana, 1 medium (425 mg).  Beans, lima, or baked beans, canned,  cup (280 mg).  Beans, white, canned,  cup (595 mg).  Beef roast, 3 oz (320 mg).  Beef, ground, 3 oz (270 mg).  Beets, raw or cooked,  cup (260 mg).  Bran muffin, 2 oz (300 mg).  Broccoli,  cup (230 mg).  Brussels sprouts,  cup (250 mg).  Cantaloupe,  cup (215 mg).  Cereal, 100% bran,  cup (200-400 mg).  Cheeseburger, single, fast food, 1 each (225-400 mg).  Chicken, 3 oz (220 mg).  Clams, canned, 3 oz (535 mg).  Crab, 3 oz (225 mg).  Dates, 5 each (270 mg).  Dried beans and peas,  cup (300-475 mg).  Figs, dried, 2 each (260 mg).  Fish: halibut, tuna, cod, snapper, 3 oz (480 mg).  Fish: salmon,  haddock, swordfish, perch, 3 oz (300 mg).  Fish, tuna, canned 3 oz (200 mg).  [place] fries, fast food, 3 oz (470 mg).  Granola with fruit and nuts,  cup (200 mg).  Grapefruit juice,  cup (200 mg).  Greens, beet,  cup (655 mg).  Honeydew melon,  cup (200 mg).  Kale, raw, 1 cup (300 mg).  Kiwi, 1 medium (240 mg).  Kohlrabi, rutabaga, parsnips,  cup (280 mg).  Lentils,  cup (365 mg).  Mango, 1 each (325 mg).  Milk, chocolate, 1 cup (420 mg).  Milk: nonfat, low-fat, whole, buttermilk, 1 cup (350-380 mg).  Molasses, 1 Tbsp (295 mg).  Mushrooms,  cup (280) mg.  Nectarine, 1 each (275 mg).  Nuts: almonds, peanuts, hazelnuts, [place], cashew, mixed, 1 oz (200 mg).  Nuts, pistachios, 1 oz (295 mg).  Orange, 1 each (240 mg).  Orange juice,  cup (235 mg).  Papaya, medium,  fruit (390 mg).  Peanut butter, chunky, 2 Tbsp (240 mg).  Peanut butter, smooth, 2 Tbsp (210 mg).  Pear, 1 medium (200 mg).  Pomegranate, 1 whole (400 mg).  Pomegranate juice,  cup (215  mg).  Pork, 3 oz (350 mg).  Potato chips, salted, 1 oz (465 mg).  Potato, baked with skin, 1 medium (925 mg).  Potatoes, boiled,  cup (255 mg).  Potatoes, mashed,  cup (330 mg).  Prune juice,  cup (370 mg).  Prunes, 5 each (305 mg).  Pudding, chocolate,  cup (230 mg).  Pumpkin, canned,  cup (250 mg).  Raisins, seedless,  cup (270 mg).  Seeds, sunflower or pumpkin, 1 oz (240 mg).  Soy milk, 1 cup (300 mg).  Spinach,  cup (420 mg).  Spinach, canned,  cup (370 mg).  Sweet potato, baked with skin, 1 medium (450 mg).  Swiss chard,  cup (480 mg).  Tomato or vegetable juice,  cup (275 mg).  Tomato sauce or puree,  cup (400-550 mg).  Tomato, raw, 1 medium (290 mg).  Tomatoes, canned,  cup (200-300 mg).  [place], 3 oz (250 mg).  Wheat germ, 1 oz (250 mg).  Winter squash,  cup (250 mg).  Yogurt, plain or fruited, 6 oz (260-435 mg).  Zucchini,  cup (220  mg). MODERATE IN POTASSIUM The following foods and beverages have 50-200 mg of potassium per serving:  Apple, 1 each (150 mg).  Apple juice,  cup (150 mg).  Applesauce,  cup (90 mg).  Apricot nectar,  cup (140 mg).  Asparagus, small spears,  cup or 6 spears (155 mg).  Bagel, cinnamon raisin, 1 each (130 mg).  Bagel, egg or plain, 4 in., 1 each (70 mg).  Beans, green,  cup (90 mg).  Beans, yellow,  cup (190 mg).  Beer, regular, 12 oz (100 mg).  Beets, canned,  cup (125 mg).  Blackberries,  cup (115 mg).  Blueberries,  cup (60 mg).  Bread, whole wheat, 1 slice (70 mg).  Broccoli, raw,  cup (145 mg).  Cabbage,  cup (150 mg).  Carrots, cooked or raw,  cup (180 mg).  Cauliflower, raw,  cup (150 mg).  Celery, raw,  cup (155 mg).  Cereal, bran flakes, cup (120-150 mg).  Cheese, cottage,  cup (110 mg).  Cherries, 10 each (150 mg).  Chocolate, 1 oz bar (165 mg).  Coffee, brewed 6 oz (90 mg).  Corn,  cup or 1 ear (195 mg).  Cucumbers,  cup (80 mg).  Egg, large, 1 each (60 mg).  Eggplant,  cup (60 mg).  Endive, raw, cup (80 mg).  English muffin, 1 each (65 mg).  Fish, orange roughy, 3 oz (150 mg).  Frankfurter, beef or pork, 1 each (75 mg).  Fruit cocktail,  cup (115 mg).  Grape juice,  cup (170 mg).  Grapefruit,  fruit (175 mg).  Grapes,  cup (155 mg).  Greens: kale, turnip, collard,  cup (110-150 mg).  Ice cream or frozen yogurt, chocolate,  cup (175 mg).  Ice cream or frozen yogurt, vanilla,  cup (120-150 mg).  Lemons, limes, 1 each (80 mg).  Lettuce, all types, 1 cup (100 mg).  Mixed vegetables,  cup (150 mg).  Mushrooms, raw,  cup (110 mg).  Nuts: walnuts, pecans, or macadamia, 1 oz (125 mg).  Oatmeal,  cup (80 mg).  Okra,  cup (110 mg).  Onions, raw,  cup (120 mg).  Peach, 1 each (185 mg).  Peaches, canned,  cup (120 mg).  Pears, canned,  cup (120 mg).  Peas, green, frozen,  cup (90  mg).  Peppers, green,  cup (130 mg).  Peppers, red,  cup (160 mg).  Pineapple juice,  cup (165 mg).  Pineapple,  fresh or canned,  cup (100 mg).  Plums, 1 each (105 mg).  Pudding, vanilla,  cup (150 mg).  Raspberries,  cup (90 mg).  Rhubarb,  cup (115 mg).  Rice, wild,  cup (80 mg).  Shrimp, 3 oz (155 mg).  Spinach, raw, 1 cup (170 mg).  Strawberries,  cup (125 mg).  Summer squash  cup (175-200 mg).  Swiss chard, raw, 1 cup (135 mg).  Tangerines, 1 each (140 mg).  Tea, brewed, 6 oz (65 mg).  Turnips,  cup (140 mg).  Watermelon,  cup (85 mg).  Wine, red, table, 5 oz (180 mg).  Wine, white, table, 5 oz (100 mg). LOW IN POTASSIUM The following foods and beverages have less than 50 mg of potassium per serving.  Bread, white, 1 slice (30 mg).  Carbonated beverages, 12 oz (less than 5 mg).  Cheese, 1 oz (20-30 mg).  Cranberries,  cup (45 mg).  Cranberry juice cocktail,  cup (20 mg).  Fats and oils, 1 Tbsp (less than 5 mg).  Hummus, 1 Tbsp (32 mg).  Nectar: papaya, mango, or pear,  cup (35 mg).  Rice, white or Geng,  cup (50 mg).  Spaghetti or macaroni,  cup cooked (30 mg).  Tortilla, flour or corn, 1 each (50 mg).  Waffle, 4 in., 1 each (50 mg).  Water chestnuts,  cup (40 mg). Document Released: 01/20/2005 Document Revised: 06/13/2013 Document Reviewed: 05/05/2013 [facility] Patient Information 2015 [place], [place]. This information is not intended to replace advice given to you by your health care provider. Make sure you discuss any questions you have with your health care provider.

## 2014-03-02 NOTE — ED Notes (Signed)
Pt from home for eval of chest pain that started at [place] after an argument with her daughter, pt states she started to have sharp chest pain that radiated to her neck. Pt also states she started to shake and "blacked out". Pt states her son woke her up and then pt states she became more dizzy and lightheaded. Prior to arrival in room, pt arms  And hands noted to be contracted, pt states hx of same years ago and was seen for possible stroke. No neuro deficits noted presently, pt axox4. nad noted.

## 2014-03-02 NOTE — ED Notes (Signed)
Pt reports chest tightness after argument with daughter. Reports sharp chest tightness associated with stiffness in bilateral arms. Pt reports this happening before when getting upset. Chest tightness worsening with deep breathing. On Initial assessment, bilateral arms contracted and stiff. At this time, bilateral arms relaxed and pt able to grip normally. Pt reports pain and soreness in bilateral arms. Also c/o generalized weakness. Speech clear, pt A&O x4

## 2014-03-02 NOTE — ED Provider Notes (Signed)
CSN: [id number]     Arrival date & time 03/02/14  1954 History   First MD Initiated Contact with Patient 03/02/14 2025     Chief Complaint  Patient presents with  . Chest Pain  . Weakness     (Consider location/radiation/quality/duration/timing/severity/associated sxs/prior Treatment) HPI  33 year old female with history of HIV, asthma, anxiety, and depression presents for evaluation of chest pain. Patient reports approximately 2 hours ago she had an argument with her daughter who subsequently ran away. She was very upset, and subsequently developed a chest pressure sensation that radiates across the chest, and experiencing tingling sensation to the tips of hands and feet, she felt lightheadedness, tingling sensation around her lips, and was very upset. She reports having a syncopal episode lasting for several seconds in which the son subsequently aroused her. At this time she reports symptom has been improved without any specific treatment but still experiencing the chest pressure. Patient denies any recent fever, chills, productive cough, hemoptysis, abdominal pain, nausea vomiting diarrhea, back pain, or rash. No prior history of PE or DVT, no recent surgery, prolonged bed rest, unilateral leg swelling or calf pain, taking birth control pill, having had cancer. Denies any family history of premature cardiac death. Patient is an occasional smoker. She reports having similar episodes like this in the past which is caused by anxiety but never this severe.  Past Medical History  Diagnosis Date  . Anxiety   . Depression   . HIV infection   . Asthma   . Abscess    Past Surgical History  Procedure Laterality Date  . Cesarean section     Family History  Problem Relation Age of Onset  . Hypertension Other   . Cancer Other   . Diabetes Other   . Asthma Other    History  Substance Use Topics  . Smoking status: Current Every Day Smoker -- 0.30 packs/day for 3 years    Types: Cigarettes   . Smokeless tobacco: Never Used  . Alcohol Use: Yes     Comment: occasional   OB History   Grav Para Term Preterm Abortions TAB SAB Ect Mult Living                 Review of Systems  All other systems reviewed and are negative.     Allergies  Bee venom and Morphine and related  Home Medications   Prior to Admission medications   Medication Sig Start Date End Date Taking? Authorizing Provider  cephALEXin (KEFLEX) 500 MG capsule Take 1 capsule (500 mg total) by mouth 4 (four) times daily. 01/19/14   Merryl Hacker, MD  ibuprofen (ADVIL,MOTRIN) 200 MG tablet Take 400 mg by mouth every 6 (six) hours as needed (for pain).     Historical Provider, MD  oxyCODONE-acetaminophen (PERCOCET/ROXICET) 5-325 MG per tablet Take 1-2 tablets by mouth every 6 (six) hours as needed for moderate pain or severe pain. 01/19/14   Merryl Hacker, MD  oxyCODONE-acetaminophen (PERCOCET/ROXICET) 5-325 MG per tablet Take 1 tablet by mouth every 6 (six) hours as needed for severe pain. 01/26/14   [place], PA-C  sulfamethoxazole-trimethoprim (SEPTRA DS) 800-160 MG per tablet Take 1 tablet by mouth every 12 (twelve) hours. 01/19/14   Merryl Hacker, MD   BP 117/80  Pulse 112  Temp(Src) 99.2 F (37.3 C) (Oral)  Resp 16  Ht 5\' 1"  (1.549 m)  Wt 149 lb (67.586 kg)  BMI 28.17 kg/m2  SpO2 99%  LMP 02/14/2014  Physical Exam  Nursing note and vitals reviewed. Constitutional: She is oriented to person, place, and time. She appears well-developed and well-nourished. No distress.  HENT:  Head: Atraumatic.  Eyes: Conjunctivae are normal.  Neck: Neck supple.  Cardiovascular: Intact distal pulses.   Mild tachycardia without murmur rubs or gallop  Pulmonary/Chest:  Lungs clear to auscultation without wheezes rales or rhonchi  Musculoskeletal:  Bilateral lower extremities without palpable cord, erythema, edema, negative Homans sign  Neurological: She is alert and oriented to person, place, and  time. She has normal strength. No cranial nerve deficit or sensory deficit. GCS eye subscore is 4. GCS verbal subscore is 5. GCS motor subscore is 6.  Skin: No rash noted.  Psychiatric: She has a normal mood and affect.    ED Course  Procedures (including critical care time)  9:08 PM Patient here with chest pain and anxiety atypical for ACS. TIMI score zero, score low on Wells criteria for PE.  Suspect anxiety attack.  Work up initiated.  Mild tachycardia, IVF given.   10:03 PM EKG is without acute ischemic changes, troponin is negative, chest x-ray shows no acute finding, her labs are reassuring except mild hypokalemia with a potassium of 3.1. Supplementation given. She has a normal orthostatic vital sign. At this time I do not think patient has any acute emergent condition. I suspect it is anxiety driven. She does have a primary care Dr. which he can followup. Strict return precautions discussed. Patient is stable for discharge. All questions answers to patient's satisfaction.   Labs Review Labs Reviewed  CBC - Abnormal; Notable for the following:    WBC 3.1 (*)    Hemoglobin 11.0 (*)    HCT 34.4 (*)    RDW 15.9 (*)    Platelets 141 (*)    All other components within normal limits  BASIC METABOLIC PANEL  Randolm Idol, ED    Imaging Review Dg Chest 2 View  03/02/2014   CLINICAL DATA:  Chest pain.  Chest tightness.  H IV.  EXAM: CHEST  2 VIEW  COMPARISON:  10/16/2013.  FINDINGS: Cardiopericardial silhouette within normal limits. Mediastinal contours normal. Trachea midline. No airspace disease or effusion.  IMPRESSION: No active cardiopulmonary disease.   Electronically Signed   By: Dereck Ligas M.D.   On: 03/02/2014 21:55     EKG Interpretation None      Date: 03/02/2014  Rate: 102  Rhythm: sinus tachycardia  QRS Axis: normal  Intervals: normal  ST/T Wave abnormalities: normal  Conduction Disutrbances:none  Narrative Interpretation:   Old EKG Reviewed:  unchanged    MDM   Final diagnoses:  Anxiety attack  Hypokalemia    BP 120/76  Pulse 96  Temp(Src) 99.2 F (37.3 C) (Oral)  Resp 16  Ht 5\' 1"  (1.549 m)  Wt 149 lb (67.586 kg)  BMI 28.17 kg/m2  SpO2 100%  LMP 02/14/2014  I have reviewed nursing notes and vital signs. I personally reviewed the imaging tests through PACS system  I reviewed available ER/hospitalization records thought the EMR     Domenic Moras, [place] 03/02/14 2257

## 2014-03-10 ENCOUNTER — Encounter (HOSPITAL_COMMUNITY): Payer: Self-pay | Admitting: Emergency Medicine

## 2014-03-10 ENCOUNTER — Emergency Department (HOSPITAL_COMMUNITY)
Admission: EM | Admit: 2014-03-10 | Discharge: 2014-03-10 | Disposition: A | Discharge status: Home / Self Care | Payer: Medicaid Other | Attending: Emergency Medicine | Admitting: Emergency Medicine

## 2014-03-10 DIAGNOSIS — D61818 Other pancytopenia: Secondary | ICD-10-CM | POA: Diagnosis not present

## 2014-03-10 DIAGNOSIS — Z9119 Patient's noncompliance with other medical treatment and regimen: Secondary | ICD-10-CM | POA: Diagnosis not present

## 2014-03-10 DIAGNOSIS — Z21 Asymptomatic human immunodeficiency virus [HIV] infection status: Secondary | ICD-10-CM | POA: Insufficient documentation

## 2014-03-10 DIAGNOSIS — Z91199 Patient's noncompliance with other medical treatment and regimen due to unspecified reason: Secondary | ICD-10-CM | POA: Insufficient documentation

## 2014-03-10 DIAGNOSIS — J45909 Unspecified asthma, uncomplicated: Secondary | ICD-10-CM | POA: Insufficient documentation

## 2014-03-10 DIAGNOSIS — F17200 Nicotine dependence, unspecified, uncomplicated: Secondary | ICD-10-CM | POA: Diagnosis not present

## 2014-03-10 DIAGNOSIS — L02415 Cutaneous abscess of right lower limb: Secondary | ICD-10-CM

## 2014-03-10 DIAGNOSIS — Z79899 Other long term (current) drug therapy: Secondary | ICD-10-CM | POA: Insufficient documentation

## 2014-03-10 DIAGNOSIS — Z8659 Personal history of other mental and behavioral disorders: Secondary | ICD-10-CM | POA: Insufficient documentation

## 2014-03-10 DIAGNOSIS — Z792 Long term (current) use of antibiotics: Secondary | ICD-10-CM | POA: Diagnosis not present

## 2014-03-10 DIAGNOSIS — L02419 Cutaneous abscess of limb, unspecified: Secondary | ICD-10-CM | POA: Insufficient documentation

## 2014-03-10 DIAGNOSIS — L03119 Cellulitis of unspecified part of limb: Secondary | ICD-10-CM | POA: Diagnosis not present

## 2014-03-10 DIAGNOSIS — L02416 Cutaneous abscess of left lower limb: Secondary | ICD-10-CM

## 2014-03-10 LAB — CBC WITH DIFFERENTIAL/PLATELET
BASOS ABS: 0 10*3/uL (ref 0.0–0.1)
Basophils Relative: 0 % (ref 0–1)
EOS ABS: 0.1 10*3/uL (ref 0.0–0.7)
Eosinophils Relative: 3 % (ref 0–5)
HEMATOCRIT: 32.3 % — AB (ref 36.0–46.0)
Hemoglobin: 10.3 g/dL — ABNORMAL LOW (ref 12.0–15.0)
LYMPHS PCT: 14 % (ref 12–46)
Lymphs Abs: 0.4 10*3/uL — ABNORMAL LOW (ref 0.7–4.0)
MCH: 26.3 pg (ref 26.0–34.0)
MCHC: 31.9 g/dL (ref 30.0–36.0)
MCV: 82.6 fL (ref 78.0–100.0)
MONOS PCT: 11 % (ref 3–12)
Monocytes Absolute: 0.3 10*3/uL (ref 0.1–1.0)
NEUTROS PCT: 72 % (ref 43–77)
Neutro Abs: 1.9 10*3/uL (ref 1.7–7.7)
PLATELETS: 102 10*3/uL — AB (ref 150–400)
RBC: 3.91 MIL/uL (ref 3.87–5.11)
RDW: 15.6 % — AB (ref 11.5–15.5)
WBC: 2.7 10*3/uL — AB (ref 4.0–10.5)

## 2014-03-10 LAB — BASIC METABOLIC PANEL
ANION GAP: 14 (ref 5–15)
BUN: 12 mg/dL (ref 6–23)
CHLORIDE: 102 meq/L (ref 96–112)
CO2: 24 mEq/L (ref 19–32)
Calcium: 8.8 mg/dL (ref 8.4–10.5)
Creatinine, Ser: 0.59 mg/dL (ref 0.50–1.10)
Glucose, Bld: 94 mg/dL (ref 70–99)
POTASSIUM: 3.7 meq/L (ref 3.7–5.3)
SODIUM: 140 meq/L (ref 137–147)

## 2014-03-10 LAB — I-STAT CG4 LACTIC ACID, ED: LACTIC ACID, VENOUS: 1.42 mmol/L (ref 0.5–2.2)

## 2014-03-10 MED ORDER — OXYCODONE-ACETAMINOPHEN 5-325 MG PO TABS
2.0000 | ORAL_TABLET | Freq: Once | ORAL | Status: AC
  Administered 2014-03-10: 2 via ORAL
  Filled 2014-03-10: qty 2

## 2014-03-10 MED ORDER — HYDROCODONE-ACETAMINOPHEN 5-325 MG PO TABS: ORAL_TABLET | ORAL | Status: DC

## 2014-03-10 MED ORDER — SULFAMETHOXAZOLE-TMP DS 800-160 MG PO TABS
1.0000 | ORAL_TABLET | Freq: Once | ORAL | Status: AC
  Administered 2014-03-10: 1 via ORAL
  Filled 2014-03-10: qty 1

## 2014-03-10 MED ORDER — SULFAMETHOXAZOLE-TRIMETHOPRIM 800-160 MG PO TABS: 1.0000 | ORAL_TABLET | Freq: Two times a day (BID) | ORAL | Status: DC

## 2014-03-10 MED ORDER — CEPHALEXIN 500 MG PO CAPS
500.0000 mg | ORAL_CAPSULE | Freq: Once | ORAL | Status: AC
  Administered 2014-03-10: 500 mg via ORAL
  Filled 2014-03-10: qty 1

## 2014-03-10 MED ORDER — LIDOCAINE-EPINEPHRINE 2 %-1:100000 IJ SOLN
30.0000 mL | Freq: Once | INTRAMUSCULAR | Status: AC
  Administered 2014-03-10: 30 mL via INTRADERMAL
  Filled 2014-03-10: qty 2

## 2014-03-10 MED ORDER — CEPHALEXIN 500 MG PO CAPS: 500.0000 mg | ORAL_CAPSULE | Freq: Four times a day (QID) | ORAL | Status: DC

## 2014-03-10 MED ORDER — SODIUM CHLORIDE 0.9 % IV BOLUS (SEPSIS)
1000.0000 mL | Freq: Once | INTRAVENOUS | Status: AC
  Administered 2014-03-10: 1000 mL via INTRAVENOUS

## 2014-03-10 NOTE — ED Provider Notes (Signed)
CSN: [id number]     Arrival date & time 03/10/14  1322 History  This chart was scribed for non-physician practitioner, Monico Blitz, PA-C,working with Dot Lanes, MD, by Marlowe Kays, ED Scribe. This patient was seen in room WTR8/WTR8 and the patient's care was started at 2:08 PM.  Chief Complaint  Patient presents with  . Abscess   Patient is a 33 y.o. female presenting with abscess. The history is provided by the patient. No language interpreter was used.  Abscess Associated symptoms: fever    HPI Comments:  Beverly [name] is a 33 y.o. female with PMH of abscesses, HIV, anxiety and depression who presents to the Emergency Department complaining of severe pain secondary to an abscess on her upper left posterior thigh that appeared three days ago. Pt states the abscess started as a small "hair bump" and has grown in size and intensified in pain. Endorses fever and chills yesterday and states the abscess started draining on its own yesterday. Pt has applied warm compresses to the area and states that is what made it start to drain. She reports touching the area or sitting makes the pain worse. She denies warmth or red streaking of the area. Pt reports she has not been taking her HIV medications and states her last CD4 count was very low. Pt is allergic to morphine and reports reaction of hives.  Past Medical History  Diagnosis Date  . Anxiety   . Depression   . HIV infection   . Asthma   . Abscess    Past Surgical History  Procedure Laterality Date  . Cesarean section     Family History  Problem Relation Age of Onset  . Hypertension Other   . Cancer Other   . Diabetes Other   . Asthma Other    History  Substance Use Topics  . Smoking status: Current Every Day Smoker -- 0.30 packs/day for 3 years    Types: Cigarettes  . Smokeless tobacco: Never Used  . Alcohol Use: Yes     Comment: occasional   OB History   Grav Para Term Preterm Abortions TAB SAB Ect Mult Living                  Review of Systems  Constitutional: Positive for fever and chills.  Skin: Negative for color change.       Multiple abscesses  All other systems reviewed and are negative.   Allergies  Bee venom and Morphine and related  Home Medications   Prior to Admission medications   Medication Sig Start Date End Date Taking? Authorizing Provider  cephALEXin (KEFLEX) 500 MG capsule Take 1 capsule (500 mg total) by mouth 4 (four) times daily. 03/10/14   Alexus Michael, PA-C  HYDROcodone-acetaminophen (NORCO/VICODIN) 5-325 MG per tablet Take 1-2 tablets by mouth every 6 hours as needed for pain. 03/10/14   Harman Langhans, PA-C  ibuprofen (ADVIL,MOTRIN) 200 MG tablet Take 400 mg by mouth every 6 (six) hours as needed (for pain).     Historical Provider, MD  potassium chloride SA (K-DUR,KLOR-CON) 20 MEQ tablet Take 1 tablet (20 mEq total) by mouth daily. 03/02/14 03/09/14  Domenic Moras, PA-C  sulfamethoxazole-trimethoprim (BACTRIM DS) 800-160 MG per tablet Take 1 tablet by mouth 2 (two) times daily. 03/10/14   Enrique Weiss, PA-C   Triage Vitals: BP 106/68  Pulse 116  Temp(Src) 97.7 F (36.5 C) (Oral)  Resp 20  Wt 130 lb (58.968 kg)  SpO2 99%  LMP 02/14/2014  Physical Exam  Nursing note and vitals reviewed. Constitutional: She is oriented to person, place, and time. She appears well-developed and well-nourished.  HENT:  Head: Normocephalic and atraumatic.  Mouth/Throat: Oropharynx is clear and moist.  Eyes: EOM are normal. Pupils are equal, round, and reactive to light.  Neck: Normal range of motion. Neck supple.  Cardiovascular: Normal rate, regular rhythm and intact distal pulses.   Pulmonary/Chest: Effort normal. No respiratory distress. She has no wheezes. She has no rales. She exhibits no tenderness.  Abdominal: Soft. There is no tenderness.  Musculoskeletal: Normal range of motion.  Neurological: She is alert and oriented to person, place, and time.  Skin: Skin is  warm and dry. Rash noted.  Patient with multiple abscesses to bilateral proximal posterior thighs. There is 2 cm of rounding cellulitis on the left leg and 3 cm of surrounding cellulitis in the right leg. There is also an abscess just proximal to the gluteal clefts with minimal fluctuating or cellulitis.  Tissues multiple small abscesses to the bilateral axilla, the largest one measures 1 cm and is on the anterior right side.  Psychiatric: She has a normal mood and affect. Her behavior is normal.    ED Course  Procedures (including critical care time) DIAGNOSTIC STUDIES: Oxygen Saturation is 99% on RA, normal by my interpretation.   COORDINATION OF CARE: 2:20 PM- Will order pain medication, lab work and I & D abscesses. Pt verbalizes understanding and agrees to plan.  INCISION AND DRAINAGE PROCEDURE NOTE: Patient identification was confirmed and verbal consent was obtained. This procedure was performed by Monico Blitz, PA-C at 3:11 PM. Site: left upper posterior thigh Sterile procedures observed Needle size: 22 G Anesthetic used (type and amt): Lidocaine 2% with Epinephrine (1 mL) Blade size: 11 Drainage: minimal Complexity: Complex Packing used: none Site anesthetized, incision made over site, wound drained and explored loculations, rinsed with copious amounts of normal saline, wound packed with sterile gauze, covered with dry, sterile dressing.  Pt tolerated procedure well without complications.  Instructions for care discussed verbally and pt provided with additional written instructions for homecare and f/u.  INCISION AND DRAINAGE PROCEDURE NOTE: Patient identification was confirmed and verbal consent was obtained. This procedure was performed by Monico Blitz, PA-C at 3:11 PM. Site: right upper posterior thigh Sterile procedures observed Needle size: 22 G Anesthetic used (type and amt): Lidocaine 2% with Epinephrine (1 mL) Blade size: 11 Drainage:  minimal Complexity: Complex Packing used: none Site anesthetized, incision made over site, wound drained and explored loculations, rinsed with copious amounts of normal saline, wound packed with sterile gauze, covered with dry, sterile dressing.  Pt tolerated procedure well without complications.  Instructions for care discussed verbally and pt provided with additional written instructions for homecare and f/u.   Medications  sodium chloride 0.9 % bolus 1,000 mL (1,000 mLs Intravenous New Bag/Given 03/10/14 1650)  sulfamethoxazole-trimethoprim (BACTRIM DS) 800-160 MG per tablet 1 tablet (1 tablet Oral Given 03/10/14 1500)  cephALEXin (KEFLEX) capsule 500 mg (500 mg Oral Given 03/10/14 1456)  oxyCODONE-acetaminophen (PERCOCET/ROXICET) 5-325 MG per tablet 2 tablet (2 tablets Oral Given 03/10/14 1456)  lidocaine-EPINEPHrine (XYLOCAINE W/EPI) 2 %-1:100000 (with pres) injection 30 mL (30 mLs Intradermal Given by Other 03/10/14 1621)    Labs Review Labs Reviewed  CBC WITH DIFFERENTIAL - Abnormal; Notable for the following:    WBC 2.7 (*)    Hemoglobin 10.3 (*)    HCT 32.3 (*)    RDW 15.6 (*)    Platelets  102 (*)    Lymphs Abs 0.4 (*)    All other components within normal limits  CULTURE, BLOOD (ROUTINE X 2)  CULTURE, BLOOD (ROUTINE X 2)  BASIC METABOLIC PANEL  I-STAT CG4 LACTIC ACID, ED    Imaging Review No results found.   EKG Interpretation None      MDM   Final diagnoses:  Multiple abscesses of both legs  Medically noncompliant  Pancytopenia    Filed Vitals:   03/10/14 1358 03/10/14 1623  BP: 106/68 109/76  Pulse: 116 113  Temp: 97.7 F (36.5 C) 98.3 F (36.8 C)  TempSrc: Oral   Resp: 20 20  Weight: 130 lb (58.968 kg)   SpO2: 99% 98%    Medications  sodium chloride 0.9 % bolus 1,000 mL (1,000 mLs Intravenous New Bag/Given 03/10/14 1650)  sulfamethoxazole-trimethoprim (BACTRIM DS) 800-160 MG per tablet 1 tablet (1 tablet Oral Given 03/10/14 1500)  cephALEXin  (KEFLEX) capsule 500 mg (500 mg Oral Given 03/10/14 1456)  oxyCODONE-acetaminophen (PERCOCET/ROXICET) 5-325 MG per tablet 2 tablet (2 tablets Oral Given 03/10/14 1456)  lidocaine-EPINEPHrine (XYLOCAINE W/EPI) 2 %-1:100000 (with pres) injection 30 mL (30 mLs Intradermal Given by Other 03/10/14 1621)    Beverly [name] is a 33 y.o. female presenting with multiple abscesses, no signs of systemic infection. Patient is HIV and has not been on antiretrovirals for over one year. Patient does not want any discussion of her HIV status in front of her son. We've had an extensive discussion on the importance of complying with her medication. I've offered to I&D 4x abscesses, however she did not tolerate incision and drainage well and only 2 were I&D'd.  Patient remains tachycardic. She will be given an IV, fluid bolus, blood cultures are drawn and also chemistry panel.  CBC shows a mild pancytopenia consistent with progressive HIV. Chemistry without abnormality. Lactate is also normal.   Discussed case with attending MD who agrees with plan and stability to d/c to home.   [name] heart rate responded well to fluid bolus. We'll discharged home with antibiotics and encouraged her to reestablish care with infectious disease Dr. Linus Salmons.  Evaluation does not show pathology that would require ongoing emergent intervention or inpatient treatment. Pt is hemodynamically stable and mentating appropriately. Discussed findings and plan with patient/guardian, who agrees with care plan. All questions answered. Return precautions discussed and outpatient follow up given.   New Prescriptions   CEPHALEXIN (KEFLEX) 500 MG CAPSULE    Take 1 capsule (500 mg total) by mouth 4 (four) times daily.   HYDROCODONE-ACETAMINOPHEN (NORCO/VICODIN) 5-325 MG PER TABLET    Take 1-2 tablets by mouth every 6 hours as needed for pain.   SULFAMETHOXAZOLE-TRIMETHOPRIM (BACTRIM DS) 800-160 MG PER TABLET    Take 1 tablet by mouth 2 (two) times  daily.     I personally performed the services described in this documentation, which was scribed in my presence. The recorded information has been reviewed and is accurate.    Monico Blitz, PA-C 03/10/14 1946

## 2014-03-10 NOTE — Discharge Instructions (Signed)
Please make an appointment to follow with Dr. Arelia Longest and restart antiretroviral medications as soon as possible.   Take your antibiotics as directed and to completion. You should never have any leftover antibiotics! Push fluids and stay well hydrated.   Please follow with your primary care doctor in the next 2 days for a check-up. They must obtain records for further management.   Do not hesitate to return to the Emergency Department for any new, worsening or concerning symptoms.

## 2014-03-10 NOTE — ED Notes (Signed)
Patient reports multiple boils on her body.  Main one is on upper left thigh.  Patient reports it has started draining.  Patient took a penicillin tablet yesterday that belonged to her boyfriend because she feels like infection is spreading.

## 2014-03-11 NOTE — ED Provider Notes (Signed)
Medical screening examination/treatment/procedure(s) were performed by non-physician practitioner and as supervising physician I was immediately available for consultation/collaboration.   EKG Interpretation None        Hoy Morn, MD 03/11/14 [phone]

## 2014-03-16 LAB — CULTURE, BLOOD (ROUTINE X 2)
Culture: NO GROWTH
Culture: NO GROWTH

## 2014-05-23 ENCOUNTER — Emergency Department (HOSPITAL_COMMUNITY)
Admission: EM | Admit: 2014-05-23 | Discharge: 2014-05-23 | Disposition: A | Discharge status: Home / Self Care | Payer: Medicaid Other | Attending: Emergency Medicine | Admitting: Emergency Medicine

## 2014-05-23 ENCOUNTER — Encounter (HOSPITAL_COMMUNITY): Payer: Self-pay | Admitting: Emergency Medicine

## 2014-05-23 DIAGNOSIS — J45909 Unspecified asthma, uncomplicated: Secondary | ICD-10-CM | POA: Insufficient documentation

## 2014-05-23 DIAGNOSIS — L02411 Cutaneous abscess of right axilla: Secondary | ICD-10-CM | POA: Diagnosis not present

## 2014-05-23 DIAGNOSIS — Z21 Asymptomatic human immunodeficiency virus [HIV] infection status: Secondary | ICD-10-CM

## 2014-05-23 DIAGNOSIS — Z720 Tobacco use: Secondary | ICD-10-CM | POA: Insufficient documentation

## 2014-05-23 DIAGNOSIS — L02412 Cutaneous abscess of left axilla: Secondary | ICD-10-CM | POA: Diagnosis present

## 2014-05-23 DIAGNOSIS — L02211 Cutaneous abscess of abdominal wall: Secondary | ICD-10-CM

## 2014-05-23 DIAGNOSIS — B20 Human immunodeficiency virus [HIV] disease: Secondary | ICD-10-CM

## 2014-05-23 DIAGNOSIS — L0231 Cutaneous abscess of buttock: Secondary | ICD-10-CM

## 2014-05-23 DIAGNOSIS — Z8659 Personal history of other mental and behavioral disorders: Secondary | ICD-10-CM | POA: Insufficient documentation

## 2014-05-23 MED ORDER — LIDOCAINE HCL 1 % IJ SOLN
5.0000 mL | Freq: Once | INTRAMUSCULAR | Status: AC
  Administered 2014-05-23: 5 mL
  Filled 2014-05-23: qty 20

## 2014-05-23 MED ORDER — LIDOCAINE HCL 2 % IJ SOLN
10.0000 mL | Freq: Once | INTRAMUSCULAR | Status: DC
  Filled 2014-05-23: qty 20

## 2014-05-23 MED ORDER — SULFAMETHOXAZOLE-TRIMETHOPRIM 800-160 MG PO TABS: 1.0000 | ORAL_TABLET | Freq: Two times a day (BID) | ORAL | Status: DC

## 2014-05-23 MED ORDER — HYDROMORPHONE HCL 1 MG/ML IJ SOLN
1.0000 mg | Freq: Once | INTRAMUSCULAR | Status: AC
  Administered 2014-05-23: 1 mg via INTRAMUSCULAR
  Filled 2014-05-23: qty 1

## 2014-05-23 MED ORDER — IBUPROFEN 800 MG PO TABS
800.0000 mg | ORAL_TABLET | Freq: Three times a day (TID) | ORAL | Status: DC
Start: 2014-05-23 — End: 2014-12-09

## 2014-05-23 MED ORDER — ONDANSETRON 4 MG PO TBDP
4.0000 mg | ORAL_TABLET | Freq: Once | ORAL | Status: AC
Start: 2014-05-23 — End: 2014-05-23
  Administered 2014-05-23: 4 mg via ORAL
  Filled 2014-05-23: qty 1

## 2014-05-23 MED ORDER — SULFAMETHOXAZOLE-TRIMETHOPRIM 800-160 MG PO TABS
1.0000 | ORAL_TABLET | Freq: Once | ORAL | Status: AC
  Administered 2014-05-23: 1 via ORAL
  Filled 2014-05-23: qty 1

## 2014-05-23 MED ORDER — HYDROCODONE-ACETAMINOPHEN 5-325 MG PO TABS: 2.0000 | ORAL_TABLET | ORAL | Status: DC | PRN

## 2014-05-23 NOTE — ED Provider Notes (Signed)
INCISION AND DRAINAGE Performed by: Jeannett Senior A Consent: Verbal consent obtained. Risks and benefits: risks, benefits and alternatives were discussed Type: abscess  Body area: right buttock  Anesthesia: local infiltration  Incision was made with a scalpel.  Local anesthetic: lidocaine 2% wo epinephrine  Anesthetic total: 3 ml  Complexity: complex Blunt dissection to break up loculations  Drainage: purulent  Drainage amount: medium  nopacking  Patient tolerance: Patient tolerated the procedure well with no immediate complications.    INCISION AND DRAINAGE Performed by: Jeannett Senior A Consent: Verbal consent obtained. Risks and benefits: risks, benefits and alternatives were discussed Type: abscess  Body area: abdominal wall  Anesthesia: local infiltration  Incision was made with a scalpel.  Local anesthetic: lidocaine 2% wo epinephrine  Anesthetic total: 3 ml  Complexity: complex Blunt dissection to break up loculations  Drainage: purulent  Drainage amount: large  Packing material:none  Patient tolerance: Patient tolerated the procedure well with no immediate complications.  INCISION AND DRAINAGE Performed by: Jeannett Senior A Consent: Verbal consent obtained. Risks and benefits: risks, benefits and alternatives were discussed Type: abscess  Body area: right axilla  Anesthesia: local infiltration  Incision was made with a scalpel.  Local anesthetic: lidocaine 2% wo epinephrine  Anesthetic total: 8 ml  Complexity: complex Blunt dissection to break up loculations  Drainage: purulent  Drainage amount: large  Packing material: 1/4 in iodoform gauze  Patient tolerance: Patient tolerated the procedure well. Pt did have bleeding at the site of the incision, moderate, pulsatile. Bovie used to stop bleeding with pressure. Pt monitored      Renold Genta, PA-C 05/24/14 0025  Charlesetta Shanks, MD 05/24/14 [phone]

## 2014-05-23 NOTE — ED Notes (Signed)
Pt states that she has been having abscesses x 2 wks.  Abscesses to under both arms, bottom, stomach, and on thigh.

## 2014-05-23 NOTE — ED Notes (Signed)
MD at bedside. 

## 2014-05-23 NOTE — ED Provider Notes (Signed)
CSN: [id number]     Arrival date & time 05/23/14  1557 History   First MD Initiated Contact with Patient 05/23/14 1621     Chief Complaint  Patient presents with  . Abscess     (Consider location/radiation/quality/duration/timing/severity/associated sxs/prior Treatment) HPI  Multiple abscesses, underarms, on stomach, right thigh, right buttock. Come and go. Never had so many at once. Whole body feels achy. Fever to 101 4 days ago. No other associated symptoms. The patient has been up and active.  Past Medical History  Diagnosis Date  . Anxiety   . Depression   . HIV infection   . Asthma   . Abscess    Past Surgical History  Procedure Laterality Date  . Cesarean section     Family History  Problem Relation Age of Onset  . Hypertension Other   . Cancer Other   . Diabetes Other   . Asthma Other    History  Substance Use Topics  . Smoking status: Current Every Day Smoker -- 0.30 packs/day for 3 years    Types: Cigarettes  . Smokeless tobacco: Never Used  . Alcohol Use: Yes     Comment: occasional   OB History    No data available     Review of Systems 10 Systems reviewed and are negative for acute change except as noted in the HPI.    Allergies  Bee venom and Morphine and related  Home Medications   Prior to Admission medications   Medication Sig Start Date End Date Taking? Authorizing Provider  HYDROcodone-acetaminophen (NORCO/VICODIN) 5-325 MG per tablet Take 1-2 tablets by mouth every 6 hours as needed for pain. 03/10/14  Yes Nicole Pisciotta, PA-C  ibuprofen (ADVIL,MOTRIN) 200 MG tablet Take 400 mg by mouth every 6 (six) hours as needed (pain).    Yes Historical Provider, MD  cephALEXin (KEFLEX) 500 MG capsule Take 1 capsule (500 mg total) by mouth 4 (four) times daily. Patient not taking: Reported on 05/23/2014 03/10/14   Elmyra Ricks Pisciotta, PA-C  HYDROcodone-acetaminophen (NORCO/VICODIN) 5-325 MG per tablet Take 2 tablets by mouth every 4 (four) hours as  needed for moderate pain or severe pain. 05/23/14   Charlesetta Shanks, MD  ibuprofen (ADVIL,MOTRIN) 800 MG tablet Take 1 tablet (800 mg total) by mouth 3 (three) times daily. 05/23/14   Charlesetta Shanks, MD  potassium chloride SA (K-DUR,KLOR-CON) 20 MEQ tablet Take 1 tablet (20 mEq total) by mouth daily. Patient not taking: Reported on 05/23/2014 03/02/14 03/09/14  Domenic Moras, PA-C  sulfamethoxazole-trimethoprim (BACTRIM DS) 800-160 MG per tablet Take 1 tablet by mouth 2 (two) times daily. Patient not taking: Reported on 05/23/2014 03/10/14   Elmyra Ricks Pisciotta, PA-C  sulfamethoxazole-trimethoprim (SEPTRA DS) 800-160 MG per tablet Take 1 tablet by mouth every 12 (twelve) hours. 05/23/14   Charlesetta Shanks, MD   BP 131/80 mmHg  Pulse 101  Temp(Src) 98.4 F (36.9 C) (Oral)  Resp 15  SpO2 100% Physical Exam  Constitutional: She is oriented to person, place, and time. She appears well-developed and well-nourished.  The patient is nontoxic in appearance. She is up ambulating about the room, she has no respiratory distress or ill appearance. She appears to be in pain. She is holding her arms up over her head.  HENT:  Head: Normocephalic.  Nose: Nose normal.  Mouth/Throat: Oropharynx is clear and moist.  Eyes: Conjunctivae and EOM are normal. Right eye exhibits no discharge. Left eye exhibits no discharge.  Neck: Neck supple.  Cardiovascular: Normal rate, regular rhythm and  normal heart sounds.   Pulmonary/Chest: Effort normal and breath sounds normal. No respiratory distress.  Abdominal: Soft. Bowel sounds are normal.  Patient does not have reproducible intra-abdominal tenderness. She does however have a abscess to the right of her umbilicus. There is fluctuance present and it appears to be approximately 2 cm. There does not appear to be any associated abdominal wall cellulitis.  Musculoskeletal: Normal range of motion. She exhibits no edema.  The patient is up and ambulating about the room moving all of her  extremities and coordinated and purposeful fashion with good evidence of strength. In bilateral axilla the patient has multiple areas of nodules consistent with the pattern of hidradenitis. Some are in various stages of swelling. Each axilla however does have an approximately 2 cm softer more fluctuant mass present in the upper aspect of each axilla. The patient also has a about a one and half centimeter nodular abscess present high on the left inner thigh. This however already has an eschar and is draining spontaneously. No associated cellulitis. The right gluteal cleft also has an abscess approximately 2 and half centimeters. This is tender it does not extend into the perianal region  Neurological: She is alert and oriented to person, place, and time. No cranial nerve deficit. Coordination normal.  Skin: Skin is warm and dry.  Psychiatric: She has a normal mood and affect.  The patient is mildly anxious and intermittently tearful but appropriate with normal cognitive function.    ED Course  Procedures (including critical care time) Labs Review Labs Reviewed - No data to display  Imaging Review No results found.   EKG Interpretation None     See procedure note separately documented by physician assistant [facility] for multiple incision and drainage procedures. MDM   Final diagnoses:  Abscess of axilla, left  Abscess of axilla, right  Abscess of buttock, right  Abscess of skin of abdomen  HIV (human immunodeficiency virus infection)   The patient resents on above. She has history of recurrent abscesses. The fluctuant abscesses were drained as documented in procedure notes. The patient is nontoxic and alert. None of these appeared to have any spreading cellulitis associated. The patient is HIV positive however at this point in time I did not feel that she required hospital admission for ongoing treatment. She is given signs and symptoms for which to return.    Charlesetta Shanks,  MD 05/24/14 [phone]

## 2014-05-23 NOTE — Discharge Instructions (Signed)

## 2014-08-12 ENCOUNTER — Encounter (HOSPITAL_COMMUNITY): Payer: Self-pay | Admitting: Nurse Practitioner

## 2014-08-12 ENCOUNTER — Emergency Department (HOSPITAL_COMMUNITY): Payer: Medicaid Other

## 2014-08-12 ENCOUNTER — Observation Stay (HOSPITAL_COMMUNITY): Payer: Medicaid Other

## 2014-08-12 ENCOUNTER — Inpatient Hospital Stay (HOSPITAL_COMMUNITY)
Admission: EM | Admit: 2014-08-12 | Discharge: 2014-08-16 | DRG: 600 | Disposition: A | Discharge status: Home / Self Care | Payer: Medicaid Other | Attending: Internal Medicine | Admitting: Internal Medicine

## 2014-08-12 DIAGNOSIS — N61 Inflammatory disorders of breast: Secondary | ICD-10-CM | POA: Diagnosis present

## 2014-08-12 DIAGNOSIS — L0390 Cellulitis, unspecified: Secondary | ICD-10-CM

## 2014-08-12 DIAGNOSIS — N644 Mastodynia: Secondary | ICD-10-CM | POA: Diagnosis not present

## 2014-08-12 DIAGNOSIS — Z9114 Patient's other noncompliance with medication regimen: Secondary | ICD-10-CM | POA: Diagnosis present

## 2014-08-12 DIAGNOSIS — F17210 Nicotine dependence, cigarettes, uncomplicated: Secondary | ICD-10-CM | POA: Diagnosis present

## 2014-08-12 DIAGNOSIS — L309 Dermatitis, unspecified: Secondary | ICD-10-CM | POA: Diagnosis present

## 2014-08-12 DIAGNOSIS — E876 Hypokalemia: Secondary | ICD-10-CM

## 2014-08-12 DIAGNOSIS — D649 Anemia, unspecified: Secondary | ICD-10-CM | POA: Diagnosis present

## 2014-08-12 DIAGNOSIS — B20 Human immunodeficiency virus [HIV] disease: Secondary | ICD-10-CM | POA: Diagnosis present

## 2014-08-12 DIAGNOSIS — Z79899 Other long term (current) drug therapy: Secondary | ICD-10-CM

## 2014-08-12 DIAGNOSIS — Z9119 Patient's noncompliance with other medical treatment and regimen: Secondary | ICD-10-CM | POA: Diagnosis present

## 2014-08-12 DIAGNOSIS — D696 Thrombocytopenia, unspecified: Secondary | ICD-10-CM | POA: Diagnosis present

## 2014-08-12 DIAGNOSIS — R509 Fever, unspecified: Secondary | ICD-10-CM

## 2014-08-12 LAB — CBC
HCT: 29.2 % — ABNORMAL LOW (ref 36.0–46.0)
Hemoglobin: 9.3 g/dL — ABNORMAL LOW (ref 12.0–15.0)
MCH: 26.5 pg (ref 26.0–34.0)
MCHC: 31.8 g/dL (ref 30.0–36.0)
MCV: 83.2 fL (ref 78.0–100.0)
Platelets: 109 10*3/uL — ABNORMAL LOW (ref 150–400)
RBC: 3.51 MIL/uL — ABNORMAL LOW (ref 3.87–5.11)
RDW: 17 % — AB (ref 11.5–15.5)
WBC: 4.2 10*3/uL (ref 4.0–10.5)

## 2014-08-12 LAB — BASIC METABOLIC PANEL
Anion gap: 7 (ref 5–15)
BUN: 14 mg/dL (ref 6–23)
CALCIUM: 8.4 mg/dL (ref 8.4–10.5)
CHLORIDE: 102 mmol/L (ref 96–112)
CO2: 25 mmol/L (ref 19–32)
CREATININE: 0.73 mg/dL (ref 0.50–1.10)
GFR calc Af Amer: >90 mL/min (ref >90)
Glucose, Bld: 93 mg/dL (ref 70–99)
Potassium: 3.4 mmol/L — ABNORMAL LOW (ref 3.5–5.1)
Sodium: 134 mmol/L — ABNORMAL LOW (ref 135–145)

## 2014-08-12 LAB — I-STAT CG4 LACTIC ACID, ED: LACTIC ACID, VENOUS: 1.09 mmol/L (ref 0.5–2.0)

## 2014-08-12 MED ORDER — HYDROMORPHONE HCL 1 MG/ML IJ SOLN
1.0000 mg | Freq: Once | INTRAMUSCULAR | Status: AC
  Administered 2014-08-12: 1 mg via INTRAVENOUS
  Filled 2014-08-12: qty 1

## 2014-08-12 MED ORDER — SODIUM CHLORIDE 0.9 % IV BOLUS (SEPSIS)
1000.0000 mL | Freq: Once | INTRAVENOUS | Status: AC
  Administered 2014-08-12: 1000 mL via INTRAVENOUS

## 2014-08-12 MED ORDER — VANCOMYCIN HCL IN DEXTROSE 1-5 GM/200ML-% IV SOLN
1000.0000 mg | Freq: Once | INTRAVENOUS | Status: AC
  Administered 2014-08-12: 1000 mg via INTRAVENOUS
  Filled 2014-08-12: qty 200

## 2014-08-12 MED ORDER — ACETAMINOPHEN 500 MG PO TABS
1000.0000 mg | ORAL_TABLET | Freq: Once | ORAL | Status: AC
  Administered 2014-08-12: 1000 mg via ORAL
  Filled 2014-08-12: qty 2

## 2014-08-12 NOTE — H&P (Addendum)
[facility] History and Physical  Beverly [name] TFT:[id number] DOB: Jun 28, 1980 DOA: 08/12/2014  Referring physician: ER physician. PCP: No primary care provider on file.  Chief Complaint: Pain in the right breast.  HPI: Beverly [name] is a 34 y.o. female with known history of AIDS who has been noncompliant with her medications and last CD4 count in the system is in April 2015 which was less than 10 presents to the ER because of pain and redness in the right breast on the lateral aspect. Patient denies any trauma or any discharge from the nipples. Patient has been having subjective feeling of fever and chills. In the ER patient was initially tachycardic and febrile. Sonogram of the breast done showed no definite abscess but did show fluid concerning for cellulitis. Patient has been started on IV antibiotics after blood cultures obtained and patient admitted for further management. Patient denies any chest pain shortness of breath nausea vomiting abdominal pain diarrhea.   Review of Systems: As presented in the history of presenting illness, rest negative.  Past Medical History  Diagnosis Date  . Anxiety   . Depression   . HIV infection   . Asthma   . Abscess    Past Surgical History  Procedure Laterality Date  . Cesarean section     Social History:  reports that she has been smoking Cigarettes.  She has a .9 pack-year smoking history. She has never used smokeless tobacco. She reports that she drinks alcohol. She reports that she uses illicit drugs (Marijuana) about 5 times per week. Where does patient live home. Can patient participate in ADLs? Yes.  Allergies  Allergen Reactions  . Bee Venom Anaphylaxis, Shortness Of Breath and Swelling  . Morphine And Related Hives and Itching    Just can't take "morphine", vicodin is OK    Family History:  Family History  Problem Relation Age of Onset  . Hypertension Other   . Cancer Other   . Diabetes Other   . Asthma Other       Prior to Admission medications   Medication Sig Start Date End Date Taking? Authorizing Provider  ciprofloxacin (CIPRO) 500 MG tablet Take 500 mg by mouth 2 (two) times daily.   Yes Historical Provider, MD  naproxen sodium (ANAPROX) 220 MG tablet Take 440 mg by mouth daily as needed (pain).   Yes Historical Provider, MD  cephALEXin (KEFLEX) 500 MG capsule Take 1 capsule (500 mg total) by mouth 4 (four) times daily. Patient not taking: Reported on 05/23/2014 03/10/14   Elmyra Ricks Pisciotta, PA-C  HYDROcodone-acetaminophen (NORCO/VICODIN) 5-325 MG per tablet Take 1-2 tablets by mouth every 6 hours as needed for pain. Patient not taking: Reported on 08/12/2014 03/10/14   Elmyra Ricks Pisciotta, PA-C  HYDROcodone-acetaminophen (NORCO/VICODIN) 5-325 MG per tablet Take 2 tablets by mouth every 4 (four) hours as needed for moderate pain or severe pain. Patient not taking: Reported on 08/12/2014 05/23/14   Charlesetta Shanks, MD  ibuprofen (ADVIL,MOTRIN) 200 MG tablet Take 400 mg by mouth every 6 (six) hours as needed (pain).     Historical Provider, MD  ibuprofen (ADVIL,MOTRIN) 800 MG tablet Take 1 tablet (800 mg total) by mouth 3 (three) times daily. Patient not taking: Reported on 08/12/2014 05/23/14   Charlesetta Shanks, MD  potassium chloride SA (K-DUR,KLOR-CON) 20 MEQ tablet Take 1 tablet (20 mEq total) by mouth daily. Patient not taking: Reported on 05/23/2014 03/02/14 03/09/14  Domenic Moras, PA-C  sulfamethoxazole-trimethoprim (BACTRIM DS) 800-160 MG per tablet Take 1 tablet  by mouth 2 (two) times daily. Patient not taking: Reported on 05/23/2014 03/10/14   Elmyra Ricks Pisciotta, PA-C  sulfamethoxazole-trimethoprim (SEPTRA DS) 800-160 MG per tablet Take 1 tablet by mouth every 12 (twelve) hours. Patient not taking: Reported on 08/12/2014 05/23/14   Charlesetta Shanks, MD    Physical Exam: Filed Vitals:   08/12/14 2000 08/12/14 2001 08/12/14 2100 08/12/14 2239  BP: 108/58 107/61 103/54 101/57  Pulse:    97  Temp:    98.1 F (36.7  C)  TempSrc:    Oral  Resp:    14  SpO2:    100%     General:  Moderately labeled and poorly nourished.  Eyes: Anicteric no pallor.  ENT: No discharge from the ears eyes nose or mouth.  Neck: No mass felt.  Cardiovascular: S1 and S2 heard.  Respiratory: No rhonchi or crepitations.  Abdomen: Soft nontender bowel sounds present.  Skin: Erythema involving the lateral aspect of right breast with tenderness.  Musculoskeletal: No edema.  Psychiatric: Appears normal.  Neurologic: Alert awake oriented to time place and person. Moves all extremities.  Labs on Admission:  Basic Metabolic Panel:  Recent Labs Lab 08/12/14 1922  NA 134*  K 3.4*  CL 102  CO2 25  GLUCOSE 93  BUN 14  CREATININE 0.73  CALCIUM 8.4   Liver Function Tests: No results for input(s): AST, ALT, ALKPHOS, BILITOT, PROT, ALBUMIN in the last 168 hours. No results for input(s): LIPASE, AMYLASE in the last 168 hours. No results for input(s): AMMONIA in the last 168 hours. CBC:  Recent Labs Lab 08/12/14 1922  WBC 4.2  HGB 9.3*  HCT 29.2*  MCV 83.2  PLT 109*   Cardiac Enzymes: No results for input(s): CKTOTAL, CKMB, CKMBINDEX, TROPONINI in the last 168 hours.  BNP (last 3 results) No results for input(s): BNP in the last 8760 hours.  ProBNP (last 3 results) No results for input(s): PROBNP in the last 8760 hours.  CBG: No results for input(s): GLUCAP in the last 168 hours.  Radiological Exams on Admission: US Breast Ltd Uni Right Inc Axilla  08/12/2014   CLINICAL DATA:  Right breast pain. Pain for 3 days. Fever. Redness and tenderness. Abscess versus cellulitis. Positive HIV. History of multiple abscesses. White cell count 4.2.  EXAM: ULTRASOUND OF THE right BREAST  COMPARISON:  None.  FINDINGS: Targeted ultrasound is performed, showing edema in the breast tissues consistent with cellulitis. No focal fluid collection demonstrated, suggesting no evidence of abscess. Note that this represents  an incomplete emergent study and does not provide comprehensive evaluation of the right breast.  IMPRESSION: Edema, likely indicating cellulitis. No abscess identified in the area of concern.  RECOMMENDATION: Follow-up diagnostic imaging in the breast center is recommended as soon as possible for complete evaluation of the abnormality.  BI-RADS CATEGORY:  3: Probably benign finding(s) - short interval follow-up suggested.   Electronically Signed   By: Lucienne Capers M.D.   On: 08/12/2014 22:48     Assessment/Plan Principal Problem:   Cellulitis of breast Active Problems:   Human immunodeficiency virus (HIV) disease   Thrombocytopenia   Normocytic anemia   1. Right breast cellulitis - patient has been placed on empiric antibiotics vancomycin and Zosyn. Follow blood cultures and closely observe for any development of abscess. 2. HIV/AIDS - has been noncompliant with her medications. Check CD4 count. 3. Chronic anemia and thrombus cytopenia - follow CBC.  Urine analysis and chest x-ray are pending.  DVT Prophylaxis Lovenox.  Code Status: Full code.  Family Communication: None.  Disposition Plan: Admit to inpatient.    Vandell Kun N. [facility] Pager [phone].  If 7PM-7AM, please contact night-coverage [email or web address] Password [facility] 08/12/2014, 11:41 PM

## 2014-08-12 NOTE — ED Provider Notes (Signed)
CSN: [id number]     Arrival date & time 08/12/14  1843 History   First MD Initiated Contact with Patient 08/12/14 1845     No chief complaint on file.    (Consider location/radiation/quality/duration/timing/severity/associated sxs/prior Treatment) HPI Comments: 34 year old female presenting with right breast pain 3 days. Initially began as a burning sensation radiating from her right breast across her chest. She went to [facility], and they interpreted her pain as chest pain, obtained a CT of her chest to rule out a blood clot which was negative. Patient reports they did not look at her breast. Today, she noticed her breast started to get very painful, red, warm and swollen. Admits to associated generalized body aches. She took aleve initially with mild relief. Admits to a history of many abscesses. Denies nipple discharge. Not breastfeeding.  The history is provided by the patient.    Past Medical History  Diagnosis Date  . Anxiety   . Depression   . HIV infection   . Asthma   . Abscess    Past Surgical History  Procedure Laterality Date  . Cesarean section     Family History  Problem Relation Age of Onset  . Hypertension Other   . Cancer Other   . Diabetes Other   . Asthma Other    History  Substance Use Topics  . Smoking status: Current Every Day Smoker -- 0.30 packs/day for 3 years    Types: Cigarettes  . Smokeless tobacco: Never Used  . Alcohol Use: Yes     Comment: occasional   OB History    No data available     Review of Systems  Genitourinary:       + R breast pain and swelling.  Musculoskeletal: Positive for myalgias and arthralgias.  Skin: Positive for color change.  All other systems reviewed and are negative.     Allergies  Bee venom and Morphine and related  Home Medications   Prior to Admission medications   Medication Sig Start Date End Date Taking? Authorizing Provider  ciprofloxacin (CIPRO) 500 MG tablet Take 500 mg by mouth 2  (two) times daily.   Yes Historical Provider, MD  naproxen sodium (ANAPROX) 220 MG tablet Take 440 mg by mouth daily as needed (pain).   Yes Historical Provider, MD  cephALEXin (KEFLEX) 500 MG capsule Take 1 capsule (500 mg total) by mouth 4 (four) times daily. Patient not taking: Reported on 05/23/2014 03/10/14   Elmyra Ricks Pisciotta, PA-C  HYDROcodone-acetaminophen (NORCO/VICODIN) 5-325 MG per tablet Take 1-2 tablets by mouth every 6 hours as needed for pain. Patient not taking: Reported on 08/12/2014 03/10/14   Elmyra Ricks Pisciotta, PA-C  HYDROcodone-acetaminophen (NORCO/VICODIN) 5-325 MG per tablet Take 2 tablets by mouth every 4 (four) hours as needed for moderate pain or severe pain. Patient not taking: Reported on 08/12/2014 05/23/14   Charlesetta Shanks, MD  ibuprofen (ADVIL,MOTRIN) 200 MG tablet Take 400 mg by mouth every 6 (six) hours as needed (pain).     Historical Provider, MD  ibuprofen (ADVIL,MOTRIN) 800 MG tablet Take 1 tablet (800 mg total) by mouth 3 (three) times daily. Patient not taking: Reported on 08/12/2014 05/23/14   Charlesetta Shanks, MD  potassium chloride SA (K-DUR,KLOR-CON) 20 MEQ tablet Take 1 tablet (20 mEq total) by mouth daily. Patient not taking: Reported on 05/23/2014 03/02/14 03/09/14  Domenic Moras, PA-C  sulfamethoxazole-trimethoprim (BACTRIM DS) 800-160 MG per tablet Take 1 tablet by mouth 2 (two) times daily. Patient not taking: Reported on 05/23/2014 03/10/14  Nicole Pisciotta, PA-C  sulfamethoxazole-trimethoprim (SEPTRA DS) 800-160 MG per tablet Take 1 tablet by mouth every 12 (twelve) hours. Patient not taking: Reported on 08/12/2014 05/23/14   Charlesetta Shanks, MD   BP 101/57 mmHg  Pulse 97  Temp(Src) 98.1 F (36.7 C) (Oral)  Resp 14  SpO2 100% Physical Exam  Constitutional: She is oriented to person, place, and time. She appears well-developed and well-nourished. No distress.  HENT:  Head: Normocephalic and atraumatic.  Mouth/Throat: Oropharynx is clear and moist.  Eyes:  Conjunctivae and EOM are normal.  Neck: Normal range of motion. Neck supple.  Cardiovascular: Regular rhythm and normal heart sounds.   Tachycardic.  Pulmonary/Chest: Effort normal and breath sounds normal. No respiratory distress.    Musculoskeletal: Normal range of motion. She exhibits no edema.  Lymphadenopathy:    She has axillary adenopathy.       Left axillary: Lateral adenopathy present.  Neurological: She is alert and oriented to person, place, and time. No sensory deficit.  Skin: Skin is warm and dry.  Psychiatric: She has a normal mood and affect. Her behavior is normal.  Nursing note and vitals reviewed.   ED Course  Procedures (including critical care time) Labs Review Labs Reviewed  CBC - Abnormal; Notable for the following:    RBC 3.51 (*)    Hemoglobin 9.3 (*)    HCT 29.2 (*)    RDW 17.0 (*)    Platelets 109 (*)    All other components within normal limits  BASIC METABOLIC PANEL - Abnormal; Notable for the following:    Sodium 134 (*)    Potassium 3.4 (*)    All other components within normal limits  CULTURE, BLOOD (ROUTINE X 2)  CULTURE, BLOOD (ROUTINE X 2)  I-STAT CG4 LACTIC ACID, ED    Imaging Review US Breast Ltd Uni Right Inc Axilla  08/12/2014   CLINICAL DATA:  Right breast pain. Pain for 3 days. Fever. Redness and tenderness. Abscess versus cellulitis. Positive HIV. History of multiple abscesses. White cell count 4.2.  EXAM: ULTRASOUND OF THE right BREAST  COMPARISON:  None.  FINDINGS: Targeted ultrasound is performed, showing edema in the breast tissues consistent with cellulitis. No focal fluid collection demonstrated, suggesting no evidence of abscess. Note that this represents an incomplete emergent study and does not provide comprehensive evaluation of the right breast.  IMPRESSION: Edema, likely indicating cellulitis. No abscess identified in the area of concern.  RECOMMENDATION: Follow-up diagnostic imaging in the breast center is recommended as  soon as possible for complete evaluation of the abnormality.  BI-RADS CATEGORY:  3: Probably benign finding(s) - short interval follow-up suggested.   Electronically Signed   By: Lucienne Capers M.D.   On: 08/12/2014 22:48     EKG Interpretation None      MDM   Final diagnoses:  Cellulitis of breast   Non-toxic appearing, NAD. Febrile and tachycardic on arrival. Concern for breast abscess. Records from [facility] visit obtained, and no mention of breast issue in the note. IV vanc started. Area large, red, warm and tender. Ultrasound obtained, showing edema, likely indicating cellulitis. Pt will be admitted for observation, admission accepted by Dr. Hal Hope, [facility], med surg. Vitals improved after IV fluids and tylenol.  Discussed with attending Dr. Aline Brochure who also evaluated patient and agrees with plan of care.   Carman Ching, PA-C 08/12/14 2306  Pamella Pert, MD 08/12/14 [phone]

## 2014-08-12 NOTE — ED Notes (Signed)
Pt presents with c/o right breast pain, onset on Friday, describes it as burning, inspection reveals an inflammation to the affected breast, vitals reveal fever and pt remarks on body aches and shortness of breath.

## 2014-08-12 NOTE — ED Notes (Signed)
Ultrasound procedure in progress 

## 2014-08-13 DIAGNOSIS — E876 Hypokalemia: Secondary | ICD-10-CM

## 2014-08-13 LAB — CBC WITH DIFFERENTIAL/PLATELET
Basophils Absolute: 0 10*3/uL (ref 0.0–0.1)
Basophils Relative: 0 % (ref 0–1)
EOS PCT: 5 % (ref 0–5)
Eosinophils Absolute: 0.2 10*3/uL (ref 0.0–0.7)
HCT: 24.7 % — ABNORMAL LOW (ref 36.0–46.0)
Hemoglobin: 7.5 g/dL — ABNORMAL LOW (ref 12.0–15.0)
LYMPHS PCT: 8 % — AB (ref 12–46)
Lymphs Abs: 0.3 10*3/uL — ABNORMAL LOW (ref 0.7–4.0)
MCH: 25.6 pg — ABNORMAL LOW (ref 26.0–34.0)
MCHC: 30.4 g/dL (ref 30.0–36.0)
MCV: 84.3 fL (ref 78.0–100.0)
MONOS PCT: 10 % (ref 3–12)
Monocytes Absolute: 0.4 10*3/uL (ref 0.1–1.0)
NEUTROS ABS: 2.8 10*3/uL (ref 1.7–7.7)
NEUTROS PCT: 76 % (ref 43–77)
Platelets: 113 10*3/uL — ABNORMAL LOW (ref 150–400)
RBC: 2.93 MIL/uL — ABNORMAL LOW (ref 3.87–5.11)
RDW: 16.9 % — ABNORMAL HIGH (ref 11.5–15.5)
WBC: 3.7 10*3/uL — AB (ref 4.0–10.5)

## 2014-08-13 LAB — COMPREHENSIVE METABOLIC PANEL
ALBUMIN: 2.4 g/dL — AB (ref 3.5–5.2)
ALT: 12 U/L (ref 0–35)
AST: 32 U/L (ref 0–37)
Alkaline Phosphatase: 76 U/L (ref 39–117)
Anion gap: 5 (ref 5–15)
BUN: 16 mg/dL (ref 6–23)
CO2: 24 mmol/L (ref 19–32)
Calcium: 7.3 mg/dL — ABNORMAL LOW (ref 8.4–10.5)
Chloride: 108 mmol/L (ref 96–112)
Creatinine, Ser: 0.78 mg/dL (ref 0.50–1.10)
GFR calc non Af Amer: >90 mL/min (ref >90)
Glucose, Bld: 81 mg/dL (ref 70–99)
Potassium: 3.2 mmol/L — ABNORMAL LOW (ref 3.5–5.1)
Sodium: 137 mmol/L (ref 135–145)
Total Bilirubin: 0.7 mg/dL (ref 0.3–1.2)
Total Protein: 7.5 g/dL (ref 6.0–8.3)

## 2014-08-13 LAB — URINE MICROSCOPIC-ADD ON

## 2014-08-13 LAB — URINALYSIS, ROUTINE W REFLEX MICROSCOPIC
Glucose, UA: NEGATIVE mg/dL
Ketones, ur: NEGATIVE mg/dL
Nitrite: NEGATIVE
Protein, ur: 100 mg/dL — AB
Specific Gravity, Urine: 1.025 (ref 1.005–1.030)
Urobilinogen, UA: 1 mg/dL (ref 0.0–1.0)
pH: 6 (ref 5.0–8.0)

## 2014-08-13 LAB — T-HELPER CELLS (CD4) COUNT (NOT AT ARMC): CD4 % Helper T Cell: <1 % — ABNORMAL LOW (ref 33–55)

## 2014-08-13 LAB — PREGNANCY, URINE: Preg Test, Ur: NEGATIVE

## 2014-08-13 MED ORDER — VANCOMYCIN HCL IN DEXTROSE 1-5 GM/200ML-% IV SOLN: 1000.0000 mg | Freq: Once | INTRAVENOUS | Status: DC

## 2014-08-13 MED ORDER — POTASSIUM CHLORIDE IN NACL 20-0.9 MEQ/L-% IV SOLN
INTRAVENOUS | Status: DC
  Administered 2014-08-13: via INTRAVENOUS
  Filled 2014-08-13 (×2): qty 1000

## 2014-08-13 MED ORDER — ACETAMINOPHEN 650 MG RE SUPP: 650.0000 mg | Freq: Four times a day (QID) | RECTAL | Status: DC | PRN

## 2014-08-13 MED ORDER — POTASSIUM CHLORIDE CRYS ER 20 MEQ PO TBCR
40.0000 meq | EXTENDED_RELEASE_TABLET | Freq: Once | ORAL | Status: AC
  Administered 2014-08-13: 40 meq via ORAL
  Filled 2014-08-13: qty 2

## 2014-08-13 MED ORDER — PIPERACILLIN-TAZOBACTAM 3.375 G IVPB 30 MIN
3.3750 g | Freq: Once | INTRAVENOUS | Status: AC
  Administered 2014-08-13: 3.375 g via INTRAVENOUS
  Filled 2014-08-13: qty 50

## 2014-08-13 MED ORDER — HYDROCODONE-ACETAMINOPHEN 5-325 MG PO TABS
1.0000 | ORAL_TABLET | Freq: Four times a day (QID) | ORAL | Status: DC | PRN
  Administered 2014-08-14 – 2014-08-15 (×6): 1 via ORAL
  Filled 2014-08-13 (×7): qty 1

## 2014-08-13 MED ORDER — MELOXICAM 7.5 MG PO TABS
7.5000 mg | ORAL_TABLET | Freq: Every day | ORAL | Status: DC | PRN
Start: 2014-08-13 — End: 2014-08-16
  Administered 2014-08-13: 7.5 mg via ORAL
  Filled 2014-08-13 (×3): qty 1

## 2014-08-13 MED ORDER — PIPERACILLIN-TAZOBACTAM 3.375 G IVPB
3.3750 g | Freq: Three times a day (TID) | INTRAVENOUS | Status: DC
  Administered 2014-08-13 – 2014-08-14 (×4): 3.375 g via INTRAVENOUS
  Filled 2014-08-13 (×6): qty 50

## 2014-08-13 MED ORDER — ENOXAPARIN SODIUM 40 MG/0.4ML ~~LOC~~ SOLN
40.0000 mg | SUBCUTANEOUS | Status: DC
  Administered 2014-08-13 – 2014-08-16 (×4): 40 mg via SUBCUTANEOUS
  Filled 2014-08-13 (×4): qty 0.4

## 2014-08-13 MED ORDER — ACETAMINOPHEN 325 MG PO TABS
650.0000 mg | ORAL_TABLET | Freq: Four times a day (QID) | ORAL | Status: DC | PRN
  Administered 2014-08-13 (×2): 650 mg via ORAL
  Filled 2014-08-13 (×2): qty 2

## 2014-08-13 MED ORDER — VANCOMYCIN HCL IN DEXTROSE 750-5 MG/150ML-% IV SOLN
750.0000 mg | Freq: Two times a day (BID) | INTRAVENOUS | Status: DC
  Administered 2014-08-13 – 2014-08-14 (×3): 750 mg via INTRAVENOUS
  Filled 2014-08-13 (×4): qty 150

## 2014-08-13 MED ORDER — ONDANSETRON HCL 4 MG/2ML IJ SOLN
4.0000 mg | Freq: Four times a day (QID) | INTRAMUSCULAR | Status: DC | PRN
  Administered 2014-08-15: 4 mg via INTRAVENOUS
  Filled 2014-08-13: qty 2

## 2014-08-13 MED ORDER — ZOLPIDEM TARTRATE 5 MG PO TABS
5.0000 mg | ORAL_TABLET | Freq: Every evening | ORAL | Status: DC | PRN
  Administered 2014-08-14 – 2014-08-15 (×3): 5 mg via ORAL
  Filled 2014-08-13 (×3): qty 1

## 2014-08-13 MED ORDER — ONDANSETRON HCL 4 MG PO TABS: 4.0000 mg | ORAL_TABLET | Freq: Four times a day (QID) | ORAL | Status: DC | PRN

## 2014-08-13 NOTE — Progress Notes (Signed)
[facility] PROGRESS NOTE  Beverly [name] QBH:[id number] DOB: February 06, 1981 DOA: 08/12/2014 PCP: No primary care provider on file.  Assessment/Plan:  Principal Problem:   Cellulitis of breast - Ultrasound of breast reported no abscess - Continue current broad spectrum antibiotic coverage  Active Problems:   Human immunodeficiency virus (HIV) disease - Awaiting CD4 which is currently pending - On discharge we'll have patient follow-up with infectious disease physicians    Thrombocytopenia - Improved from last check. Currently stable    Normocytic anemia - Dropped after fluid hydration. No active source of bleeding may be secondary to bone marrow suppression from active infection    Hypokalemia - K-Dur 40 meq x 1 -Reassess next a.m.   Code Status: full  Family Communication: discussed with patient directly  Disposition Plan: Pending improvement in condition  Consultants:  None  Procedures:  None  Antibiotics:  Zosyn and Vancomycin  HPI/Subjective: The patient has currently no complaints. No acute issues overnight   Objective: Filed Vitals:   08/13/14 1251  BP: 91/55  Pulse: 79  Temp: 98.3 F (36.8 C)  Resp: 16    Intake/Output Summary (Last 24 hours) at 08/13/14 1645 Last data filed at 08/13/14 1100  Gross per 24 hour  Intake    440 ml  Output    300 ml  Net    140 ml   Filed Weights   08/13/14 0018  Weight: 53.8 kg (118 lb 9.7 oz)    Exam:   General:  Patient in no acute distress, alert and awake   Cardiovascular: Regular rate and rhythm, no murmurs rubs   Respiratory: Clear to auscultation bilaterally, no wheezes   Abdomen: Soft, nondistended, nontender   Musculoskeletal: No cyanosis or clubbing  Skin: Cellulitis of right lateral breast exquisitely tender on palpation no discharge   Data Reviewed: Basic Metabolic Panel:  Recent Labs Lab 08/12/14 1922 08/13/14 0426  NA 134* 137  K 3.4* 3.2*  CL 102 108  CO2 25 24  GLUCOSE  93 81  BUN 14 16  CREATININE 0.73 0.78  CALCIUM 8.4 7.3*   Liver Function Tests:  Recent Labs Lab 08/13/14 0426  AST 32  ALT 12  ALKPHOS 76  BILITOT 0.7  PROT 7.5  ALBUMIN 2.4*   No results for input(s): LIPASE, AMYLASE in the last 168 hours. No results for input(s): AMMONIA in the last 168 hours. CBC:  Recent Labs Lab 08/12/14 1922 08/13/14 0426  WBC 4.2 3.7*  NEUTROABS  --  2.8  HGB 9.3* 7.5*  HCT 29.2* 24.7*  MCV 83.2 84.3  PLT 109* 113*   Cardiac Enzymes: No results for input(s): CKTOTAL, CKMB, CKMBINDEX, TROPONINI in the last 168 hours. BNP (last 3 results) No results for input(s): BNP in the last 8760 hours.  ProBNP (last 3 results) No results for input(s): PROBNP in the last 8760 hours.  CBG: No results for input(s): GLUCAP in the last 168 hours.  No results found for this or any previous visit (from the past 240 hour(s)).   Studies: Dg Chest Port 1 View  08/12/2014   CLINICAL DATA:  Cough, congestion, and shortness of breath since Thursday. Fever. Smoker.  EXAM: PORTABLE CHEST - 1 VIEW  COMPARISON:  03/02/2014.  FINDINGS: The heart size and mediastinal contours are within normal limits. Both lungs are clear. The visualized skeletal structures are unremarkable.  IMPRESSION: No active disease.   Electronically Signed   By: Lucienne Capers M.D.   On: 08/12/2014 23:59   [place]  [place] Ax[place]  08/12/2014   CLINICAL DATA:  Right breast pain. Pain for 3 days. Fever. Redness and tenderness. Abscess versus cellulitis. Positive HIV. History of multiple abscesses. White cell count 4.2.  EXAM: ULTRASOUND OF THE right BREAST  COMPARISON:  None.  FINDINGS: Targeted ultrasound is performed, showing edema in the breast tissues consistent with cellulitis. No focal fluid collection demonstrated, suggesting no evidence of abscess. Note that this represents an incomplete emergent study and does not provide comprehensive evaluation of the right breast.  IMPRESSION:  Edema, likely indicating cellulitis. No abscess identified in the area of concern.  RECOMMENDATION: Follow-up diagnostic imaging in the breast center is recommended as soon as possible for complete evaluation of the abnormality.  BI-RADS CATEGORY:  3: Probably benign finding(s) - short interval follow-up suggested.   Electronically Signed   By: Lucienne Capers M.D.   On: 08/12/2014 22:48    Scheduled Meds: . enoxaparin (LOVENOX) injection  40 mg Subcutaneous Q24H  . piperacillin-tazobactam (ZOSYN)  IV  3.375 g Intravenous 3 times per day  . vancomycin  750 mg Intravenous Q12H   Continuous Infusions:    Time spent: > 35 minutes    Velvet Bathe  [facility] Pager [phone] If 7PM-7AM, please contact night-coverage at [email or web address], password [facility] 08/13/2014, 4:45 PM  LOS: 1 day

## 2014-08-13 NOTE — Plan of Care (Signed)
Problem: Phase I Progression Outcomes Goal: Hemodynamically stable Outcome: Progressing Temp 100.7 tonight given tylenol prn with temperature decreasing.

## 2014-08-13 NOTE — Plan of Care (Signed)
Problem: Phase I Progression Outcomes Goal: Pain controlled with appropriate interventions Outcome: Progressing Pain in right breast given prn and heat packs prn.

## 2014-08-13 NOTE — Progress Notes (Signed)
UR complete 

## 2014-08-13 NOTE — Progress Notes (Signed)
Patient with skin irritation area to right anterior lower leg--skin peeling, dry, skin broken in areas. Skin changes noted around area.  Cleansed with betadine and foam dsg applied over area.

## 2014-08-14 DIAGNOSIS — E876 Hypokalemia: Secondary | ICD-10-CM

## 2014-08-14 DIAGNOSIS — N61 Inflammatory disorders of breast: Principal | ICD-10-CM

## 2014-08-14 DIAGNOSIS — B20 Human immunodeficiency virus [HIV] disease: Secondary | ICD-10-CM

## 2014-08-14 DIAGNOSIS — D649 Anemia, unspecified: Secondary | ICD-10-CM

## 2014-08-14 DIAGNOSIS — D696 Thrombocytopenia, unspecified: Secondary | ICD-10-CM

## 2014-08-14 LAB — CBC
HCT: 26.1 % — ABNORMAL LOW (ref 36.0–46.0)
Hemoglobin: 8.1 g/dL — ABNORMAL LOW (ref 12.0–15.0)
MCH: 26.2 pg (ref 26.0–34.0)
MCHC: 31 g/dL (ref 30.0–36.0)
MCV: 84.5 fL (ref 78.0–100.0)
Platelets: 98 10*3/uL — ABNORMAL LOW (ref 150–400)
RBC: 3.09 MIL/uL — ABNORMAL LOW (ref 3.87–5.11)
RDW: 17.1 % — ABNORMAL HIGH (ref 11.5–15.5)
WBC: 4.1 10*3/uL (ref 4.0–10.5)

## 2014-08-14 LAB — URINE CULTURE
COLONY COUNT: NO GROWTH
CULTURE: NO GROWTH

## 2014-08-14 LAB — POTASSIUM: Potassium: 3.7 mmol/L (ref 3.5–5.1)

## 2014-08-14 MED ORDER — EMTRICITABINE-TENOFOVIR DF 200-300 MG PO TABS
1.0000 | ORAL_TABLET | Freq: Every day | ORAL | Status: DC
  Administered 2014-08-14 – 2014-08-16 (×3): 1 via ORAL
  Filled 2014-08-14 (×3): qty 1

## 2014-08-14 MED ORDER — DARUNAVIR ETHANOLATE 800 MG PO TABS
800.0000 mg | ORAL_TABLET | Freq: Every day | ORAL | Status: DC
  Administered 2014-08-14 – 2014-08-16 (×3): 800 mg via ORAL
  Filled 2014-08-14 (×4): qty 1

## 2014-08-14 MED ORDER — HYDROCERIN EX CREA
TOPICAL_CREAM | Freq: Two times a day (BID) | CUTANEOUS | Status: DC
  Administered 2014-08-14 – 2014-08-16 (×4): via TOPICAL
  Filled 2014-08-14: qty 113

## 2014-08-14 MED ORDER — CEFAZOLIN SODIUM 1-5 GM-% IV SOLN
1.0000 g | Freq: Three times a day (TID) | INTRAVENOUS | Status: DC
  Administered 2014-08-14 – 2014-08-16 (×6): 1 g via INTRAVENOUS
  Filled 2014-08-14 (×8): qty 50

## 2014-08-14 MED ORDER — SULFAMETHOXAZOLE-TRIMETHOPRIM 800-160 MG PO TABS
1.0000 | ORAL_TABLET | ORAL | Status: DC
  Administered 2014-08-15: 1 via ORAL
  Filled 2014-08-14 (×2): qty 1

## 2014-08-14 MED ORDER — RITONAVIR 100 MG PO TABS
100.0000 mg | ORAL_TABLET | Freq: Every day | ORAL | Status: DC
  Administered 2014-08-14 – 2014-08-16 (×3): 100 mg via ORAL
  Filled 2014-08-14 (×4): qty 1

## 2014-08-14 MED ORDER — AZITHROMYCIN 600 MG PO TABS
1200.0000 mg | ORAL_TABLET | ORAL | Status: DC
  Administered 2014-08-15: 1200 mg via ORAL
  Filled 2014-08-14: qty 2

## 2014-08-14 NOTE — Consult Note (Signed)
[facility]  Total days of antibiotics 3        Day 3 vanco        Day 3 piptazo         Reason for Consult: poorly controlled HIV disease and cellulitis   Referring Physician:vega  Principal Problem:   Cellulitis of breast Active Problems:   Human immunodeficiency virus (HIV) disease   Thrombocytopenia   Normocytic anemia   Hypokalemia    HPI: Beverly [name] is a 34 y.o. female  With poorly controlled HIV disease, Cd 4 count <10 (<1%) (Feb 2016)/VL 94,854 ( April 2015), has been off of meds since mid 2013. Genotype had non significant mutations in 2010. She was undetectable in early 2013 with CD 4 count of 60, on truvada/DRVr. CD4 count has been <100 in the past 5 years. She had stopped taking her regimen due to multiple life stressors. She was admitted on 2/21 for right breast tenderness that started 1-2 days prior to admission, and she was found to have cellulitis, with high fevers. She was started on vancomycin and piptazo with improvement in fever curve. U/s of breast did not show any deep tissue abscess. Infectious workup of blood cx is negative. Previously hospitalized in 2015 for ecoli pyelonephritis as well as respiratory infection. Having some improvement with abtx but she still has significant tenderness of outer upper quadrant of right breast. No fevers now.  She has been out of care due to dealing with family stressors, caring for her father who has recurrent cancer, her kids, and stressed for being unemployed.  Past Medical History  Diagnosis Date  . Anxiety   . Depression   . HIV infection   . Asthma   . Abscess     Allergies:  Allergies  Allergen Reactions  . Bee Venom Anaphylaxis, Shortness Of Breath and Swelling  . Morphine And Related Hives and Itching    Just can't take "morphine", vicodin is OK     MEDICATIONS: . [START ON 08/15/2014] azithromycin  1,200 mg Oral Weekly  .  ceFAZolin (ANCEF) IV  1 g Intravenous Q8H  .  darunavir  800 mg Oral Q breakfast  . emtricitabine-tenofovir  1 tablet Oral Daily  . enoxaparin (LOVENOX) injection  40 mg Subcutaneous Q24H  . ritonavir  100 mg Oral Q breakfast  . [START ON 08/15/2014] sulfamethoxazole-trimethoprim  1 tablet Oral Once per day on Mon Wed Fri    History  Substance Use Topics  . Smoking status: Current Every Day Smoker -- 0.30 packs/day for 3 years    Types: Cigarettes  . Smokeless tobacco: Never Used  . Alcohol Use: Yes     Comment: occasional    Family History  Problem Relation Age of Onset  . Hypertension Other   . Cancer Other   . Diabetes Other   . Asthma Other     Review of Systems  Constitutional: Negative for fever, chills, diaphoresis, activity change, appetite change, fatigue and unexpected weight change.  HENT: Negative for congestion, sore throat, rhinorrhea, sneezing, trouble swallowing and sinus pressure.  Eyes: Negative for photophobia and visual disturbance.  Respiratory: Negative for cough, chest tightness, shortness of breath, wheezing and stridor.  Cardiovascular: Negative for chest pain, palpitations and leg swelling.  Gastrointestinal: Negative for nausea, vomiting, abdominal pain, diarrhea, constipation, blood in stool, abdominal distention and anal bleeding.  Genitourinary: Negative for dysuria, hematuria, flank pain and difficulty urinating.  Musculoskeletal: Negative for myalgias, back pain, joint  swelling, arthralgias and gait problem.  Skin: right breast tenderness and dry skin from eczema Neurological: Negative for dizziness, tremors, weakness and light-headedness.  Hematological: Negative for adenopathy. Does not bruise/bleed easily.  Psychiatric/Behavioral: Negative for behavioral problems, confusion, sleep disturbance, dysphoric mood, decreased concentration and agitation.    OBJECTIVE: Temp:  [97.9 F (36.6 C)-100.7 F (38.2 C)] 97.9 F (36.6 C) (02/23 1255) Pulse Rate:  [82-97] 88 (02/23 1255) Resp:   [16-18] 16 (02/23 1255) BP: (97-105)/(51-67) 97/59 mmHg (02/23 1255) SpO2:  [93 %-100 %] 100 % (02/23 1255)  Constitutional:  oriented to person, place, and time. appears well-developed and well-nourished. No distress.  HENT:  Mouth/Throat: Oropharynx is clear and moist. No oropharyngeal exudate.  Cardiovascular: Normal rate, regular rhythm and normal heart sounds. Exam reveals no gallop and no friction rub.  No murmur heard.  Pulmonary/Chest: Effort normal and breath sounds normal. No respiratory distress.  has no wheezes.  Abdominal: Soft. Bowel sounds are normal.  exhibits no distension. There is no tenderness.  Lymphadenopathy: no cervical adenopathy. + right axillary LN Neurological: alert and oriented to person, place, and time.  Skin: indurated  upper lateral quadrant of right breast, slightly red. Tender to touch Psychiatric: a normal mood and affect.  behavior is normal.    LABS: Results for orders placed or performed during the hospital encounter of 08/12/14 (from the past 48 hour(s))  CBC     Status: Abnormal   Collection Time: 08/12/14  7:22 PM  Result Value Ref Range   WBC 4.2 4.0 - 10.5 K/uL   RBC 3.51 (L) 3.87 - 5.11 MIL/uL   Hemoglobin 9.3 (L) 12.0 - 15.0 g/dL   HCT 29.2 (L) 36.0 - 46.0 %   MCV 83.2 78.0 - 100.0 fL   MCH 26.5 26.0 - 34.0 pg   MCHC 31.8 30.0 - 36.0 g/dL   RDW 17.0 (H) 11.5 - 15.5 %   Platelets 109 (L) 150 - 400 K/uL    Comment: RESULT REPEATED AND VERIFIED SPECIMEN CHECKED FOR CLOTS CONSISTENT WITH PREVIOUS RESULT   Basic metabolic panel     Status: Abnormal   Collection Time: 08/12/14  7:22 PM  Result Value Ref Range   Sodium 134 (L) 135 - 145 mmol/L   Potassium 3.4 (L) 3.5 - 5.1 mmol/L   Chloride 102 96 - 112 mmol/L   CO2 25 19 - 32 mmol/L   Glucose, Bld 93 70 - 99 mg/dL   BUN 14 6 - 23 mg/dL   Creatinine, Ser 0.73 0.50 - 1.10 mg/dL   Calcium 8.4 8.4 - 10.5 mg/dL   GFR calc non Af Amer >90 >90 mL/min   GFR calc Af Amer >90 >90 mL/min     Comment: (NOTE) The eGFR has been calculated using the CKD EPI equation. This calculation has not been validated in all clinical situations. eGFR's persistently <90 mL/min signify possible Chronic Kidney Disease.    Anion gap 7 5 - 15  Blood culture (routine x 2)     Status: None (Preliminary result)   Collection Time: 08/12/14  7:44 PM  Result Value Ref Range   Specimen Description BLOOD RIGHT ARM    Special Requests BOTTLES DRAWN AEROBIC AND ANAEROBIC 5CC    Culture             BLOOD CULTURE RECEIVED NO GROWTH TO DATE CULTURE WILL BE HELD FOR 5 DAYS BEFORE ISSUING A FINAL NEGATIVE REPORT Performed at [facility]    Report Status PENDING  Blood culture (routine x 2)     Status: None (Preliminary result)   Collection Time: 08/12/14  7:45 PM  Result Value Ref Range   Specimen Description BLOOD LEFT ARM    Special Requests BOTTLES DRAWN AEROBIC AND ANAEROBIC 5CC    Culture             BLOOD CULTURE RECEIVED NO GROWTH TO DATE CULTURE WILL BE HELD FOR 5 DAYS BEFORE ISSUING A FINAL NEGATIVE REPORT Performed at [facility]    Report Status PENDING   I-Stat CG4 Lactic Acid, ED     Status: None   Collection Time: 08/12/14  7:48 PM  Result Value Ref Range   Lactic Acid, Venous 1.09 0.5 - 2.0 mmol/L  Urinalysis, Routine w reflex microscopic     Status: Abnormal   Collection Time: 08/12/14 11:50 PM  Result Value Ref Range   Color, Urine AMBER (A) YELLOW    Comment: BIOCHEMICALS MAY BE AFFECTED BY COLOR   APPearance TURBID (A) CLEAR   Specific Gravity, Urine 1.025 1.005 - 1.030   pH 6.0 5.0 - 8.0   Glucose, UA NEGATIVE NEGATIVE mg/dL   Hgb urine dipstick MODERATE (A) NEGATIVE   Bilirubin Urine SMALL (A) NEGATIVE   Ketones, ur NEGATIVE NEGATIVE mg/dL   Protein, ur 100 (A) NEGATIVE mg/dL   Urobilinogen, UA 1.0 0.0 - 1.0 mg/dL   Nitrite NEGATIVE NEGATIVE   Leukocytes, UA LARGE (A) NEGATIVE  Pregnancy, urine     Status: None   Collection Time: 08/12/14 11:50 PM    Result Value Ref Range   Preg Test, Ur NEGATIVE NEGATIVE    Comment:        THE SENSITIVITY OF THIS METHODOLOGY IS >20 mIU/mL.   Urine microscopic-add on     Status: Abnormal   Collection Time: 08/12/14 11:50 PM  Result Value Ref Range   Squamous Epithelial / LPF FEW (A) RARE   WBC, UA TOO NUMEROUS TO COUNT <3 WBC/hpf   RBC / HPF 3-6 <3 RBC/hpf   Bacteria, UA MANY (A) RARE   Casts GRANULAR CAST (A) NEGATIVE  Comprehensive metabolic panel     Status: Abnormal   Collection Time: 08/13/14  4:26 AM  Result Value Ref Range   Sodium 137 135 - 145 mmol/L   Potassium 3.2 (L) 3.5 - 5.1 mmol/L   Chloride 108 96 - 112 mmol/L   CO2 24 19 - 32 mmol/L   Glucose, Bld 81 70 - 99 mg/dL   BUN 16 6 - 23 mg/dL   Creatinine, Ser 0.78 0.50 - 1.10 mg/dL   Calcium 7.3 (L) 8.4 - 10.5 mg/dL   Total Protein 7.5 6.0 - 8.3 g/dL   Albumin 2.4 (L) 3.5 - 5.2 g/dL   AST 32 0 - 37 U/L   ALT 12 0 - 35 U/L   Alkaline Phosphatase 76 39 - 117 U/L   Total Bilirubin 0.7 0.3 - 1.2 mg/dL   GFR calc non Af Amer >90 >90 mL/min   GFR calc Af Amer >90 >90 mL/min    Comment: (NOTE) The eGFR has been calculated using the CKD EPI equation. This calculation has not been validated in all clinical situations. eGFR's persistently <90 mL/min signify possible Chronic Kidney Disease.    Anion gap 5 5 - 15  CBC with Differential/Platelet     Status: Abnormal   Collection Time: 08/13/14  4:26 AM  Result Value Ref Range   WBC 3.7 (L) 4.0 - 10.5 K/uL  RBC 2.93 (L) 3.87 - 5.11 MIL/uL   Hemoglobin 7.5 (L) 12.0 - 15.0 g/dL    Comment: DELTA CHECK NOTED REPEATED TO VERIFY    HCT 24.7 (L) 36.0 - 46.0 %   MCV 84.3 78.0 - 100.0 fL   MCH 25.6 (L) 26.0 - 34.0 pg   MCHC 30.4 30.0 - 36.0 g/dL   RDW 16.9 (H) 11.5 - 15.5 %   Platelets 113 (L) 150 - 400 K/uL    Comment: CONSISTENT WITH PREVIOUS RESULT   Neutrophils Relative % 76 43 - 77 %   Neutro Abs 2.8 1.7 - 7.7 K/uL   Lymphocytes Relative 8 (L) 12 - 46 %   Lymphs Abs 0.3  (L) 0.7 - 4.0 K/uL   Monocytes Relative 10 3 - 12 %   Monocytes Absolute 0.4 0.1 - 1.0 K/uL   Eosinophils Relative 5 0 - 5 %   Eosinophils Absolute 0.2 0.0 - 0.7 K/uL   Basophils Relative 0 0 - 1 %   Basophils Absolute 0.0 0.0 - 0.1 K/uL  T-helper cells (CD4) count     Status: Abnormal   Collection Time: 08/13/14  4:26 AM  Result Value Ref Range   CD4 T Cell Abs <10 (L) 400 - 2700 /uL   CD4 % Helper T Cell <1 (L) 33 - 55 %  Potassium     Status: None   Collection Time: 08/14/14  4:34 AM  Result Value Ref Range   Potassium 3.7 3.5 - 5.1 mmol/L  CBC     Status: Abnormal   Collection Time: 08/14/14  1:47 PM  Result Value Ref Range   WBC 4.1 4.0 - 10.5 K/uL   RBC 3.09 (L) 3.87 - 5.11 MIL/uL   Hemoglobin 8.1 (L) 12.0 - 15.0 g/dL   HCT 26.1 (L) 36.0 - 46.0 %   MCV 84.5 78.0 - 100.0 fL   MCH 26.2 26.0 - 34.0 pg   MCHC 31.0 30.0 - 36.0 g/dL   RDW 17.1 (H) 11.5 - 15.5 %   Platelets 98 (L) 150 - 400 K/uL    Comment: REPEATED TO VERIFY SPECIMEN CHECKED FOR CLOTS CONSISTENT WITH PREVIOUS RESULT     MICRO: 2/21 blood cx NGTD 2/22 urine cx NGTD  IMAGING: Dg Chest Port 1 View  08/12/2014   CLINICAL DATA:  Cough, congestion, and shortness of breath since Thursday. Fever. Smoker.  EXAM: PORTABLE CHEST - 1 VIEW  COMPARISON:  03/02/2014.  FINDINGS: The heart size and mediastinal contours are within normal limits. Both lungs are clear. The visualized skeletal structures are unremarkable.  IMPRESSION: No active disease.   Electronically Signed   By: Lucienne Capers M.D.   On: 08/12/2014 23:59   US Breast Ltd Uni Right Inc Axilla  08/12/2014   CLINICAL DATA:  Right breast pain. Pain for 3 days. Fever. Redness and tenderness. Abscess versus cellulitis. Positive HIV. History of multiple abscesses. White cell count 4.2.  EXAM: ULTRASOUND OF THE right BREAST  COMPARISON:  None.  FINDINGS: Targeted ultrasound is performed, showing edema in the breast tissues consistent with cellulitis. No focal  fluid collection demonstrated, suggesting no evidence of abscess. Note that this represents an incomplete emergent study and does not provide comprehensive evaluation of the right breast.  IMPRESSION: Edema, likely indicating cellulitis. No abscess identified in the area of concern.  RECOMMENDATION: Follow-up diagnostic imaging in the breast center is recommended as soon as possible for complete evaluation of the abnormality.  BI-RADS CATEGORY:  3: Probably benign  finding(s) - short interval follow-up suggested.   Electronically Signed   By: Lucienne Capers M.D.   On: 08/12/2014 22:48    Assessment/Plan:  34yo F with advanced HIV disease, off of ART admitted for cellulitis of moderate severity.  1) hiv disease : viral load and genotype pending. Will start back on her prior regimen of truvada, darunavir and ritonavir. Will have her discharged on this regimen and also have her re-establish in the ID clinic  2) OI proph : will start on bactrim DS M-W-F plus azithromycin 1242m weekly  3) cellulitis = cefazolin 1gm IV Q 8hr for now and discontinue vancomycin and piptazo  4) health maintenance = will get rpr, and hep c.  5) eczema = will give eucerin bid prn

## 2014-08-14 NOTE — Progress Notes (Signed)
ANTIBIOTIC CONSULT NOTE  Pharmacy Consult for vancomycin and Zosyn Indication: cellulitis of breast  Allergies  Allergen Reactions  . Bee Venom Anaphylaxis, Shortness Of Breath and Swelling  . Morphine And Related Hives and Itching    Just can't take "morphine", vicodin is OK    Patient Measurements: Height: 5\' 1"  (154.9 cm) Weight: 118 lb 9.7 oz (53.8 kg) IBW/kg (Calculated) : 47.8  Assessment: 34 yoF admitted 2/21 pm with right breast pain 3 days. Initially began as a burning sensation radiating from her right breast across her chest. Right breast w/ cellulitis, possibly abscess. Noted patient with known Hx of AIDS and noncomplaince with medications. Pharmacy is consulted to dose vancomycin and Zosyn for cellulitis with possible associated abscess  2/21 [place] of R breast shows likely cellulitis, no abscess identified  Antiinfectives  2/21 >> vancomycin >> 2/21 >> Zosyn >>  Labs / vitals Tmax: 100.7 WBCs: low on 2/22 Renal: SCr 0.78 on 2/22, CrCl 76 ml/min CG, >100N CD4: <10 (2/22)  Microbiology 2/21 blood x2: ngtd 2/21 urine: IP (UA grossly positive for UTI)  Goal of Therapy:  Vancomycin trough level 10-15 mcg/ml Zosyn per indication and renal function  Plan:  - continue vancomycin 750mg  IV q12h - continue Zosyn 3.375G IV q8h, each dose to be infused over 4 hours - vancomycin trough at steady state if indicated - follow-up clinical course, culture results, renal function - follow-up antibiotic de-escalation and length of therapy  Thank you for the consult.  Currie Paris, PharmD, BCPS Pager: [phone] Pharmacy: [phone] 08/14/2014 1:05 PM

## 2014-08-14 NOTE — Progress Notes (Signed)
[facility] PROGRESS NOTE  Beverly [name] WLN:[id number] DOB: [birth year] DOA: 08/12/2014 PCP: No primary care provider on file.  Brief narrative: Patient is a 34 year old with history of HIV currently noncompliant with antiviral medications presented with left breast cellulitis.  Assessment/Plan:  Principal Problem:   Cellulitis of breast - Ultrasound of breast reported no abscess - Continue current broad spectrum antibiotic coverage  Active Problems:   Human immunodeficiency virus (HIV) disease - Pt with low CD4 count. She reports she has not been taking her antiviral medication for months. When asked why she reports many things, "I have been going through a lot." - Given low CD4 counts will try to obtain viral load and consult infectious disease to try and get her back on antiviral so that she can mount a better immunological response to active infection - On discharge we'll have patient follow-up with infectious disease physicians    Thrombocytopenia - Improved from last check. Currently stable    Normocytic anemia - Dropped after fluid hydration. No active source of bleeding may be secondary to bone marrow suppression from active infection    Hypokalemia -Resolved after oral replacement.   Code Status: full  Family Communication: discussed with patient directly  Disposition Plan: Pending improvement in condition  Consultants:  ID  Procedures:  None  Antibiotics:  Zosyn and Vancomycin  HPI/Subjective: The patient has currently no complaints. No acute issues overnight   Objective: Filed Vitals:   08/14/14 0651  BP: 105/67  Pulse: 82  Temp: 98.6 F (37 C)  Resp: 18    Intake/Output Summary (Last 24 hours) at 08/14/14 1255 Last data filed at 08/14/14 0636  Gross per 24 hour  Intake 1189.08 ml  Output      0 ml  Net 1189.08 ml   Filed Weights   08/13/14 0018  Weight: 53.8 kg (118 lb 9.7 oz)    Exam:   General:  Patient in no acute  distress, alert and awake   Cardiovascular: Regular rate and rhythm, no murmurs rubs   Respiratory: Clear to auscultation bilaterally, no wheezes   Abdomen: Soft, nondistended, nontender   Musculoskeletal: No cyanosis or clubbing  Skin: Cellulitis of right lateral breast exquisitely tender on palpation no discharge   Data Reviewed: Basic Metabolic Panel:  Recent Labs Lab 08/12/14 1922 08/13/14 0426 08/14/14 0434  NA 134* 137  --   K 3.4* 3.2* 3.7  CL 102 108  --   CO2 25 24  --   GLUCOSE 93 81  --   BUN 14 16  --   CREATININE 0.73 0.78  --   CALCIUM 8.4 7.3*  --    Liver Function Tests:  Recent Labs Lab 08/13/14 0426  AST 32  ALT 12  ALKPHOS 76  BILITOT 0.7  PROT 7.5  ALBUMIN 2.4*   No results for input(s): LIPASE, AMYLASE in the last 168 hours. No results for input(s): AMMONIA in the last 168 hours. CBC:  Recent Labs Lab 08/12/14 1922 08/13/14 0426  WBC 4.2 3.7*  NEUTROABS  --  2.8  HGB 9.3* 7.5*  HCT 29.2* 24.7*  MCV 83.2 84.3  PLT 109* 113*   Cardiac Enzymes: No results for input(s): CKTOTAL, CKMB, CKMBINDEX, TROPONINI in the last 168 hours. BNP (last 3 results) No results for input(s): BNP in the last 8760 hours.  ProBNP (last 3 results) No results for input(s): PROBNP in the last 8760 hours.  CBG: No results for input(s): GLUCAP in the last 168 hours.  Recent Results (from the past 240 hour(s))  Blood culture (routine x 2)     Status: None (Preliminary result)   Collection Time: 08/12/14  7:44 PM  Result Value Ref Range Status   Specimen Description BLOOD RIGHT ARM  Final   Special Requests BOTTLES DRAWN AEROBIC AND ANAEROBIC 5CC  Final   Culture   Final           BLOOD CULTURE RECEIVED NO GROWTH TO DATE CULTURE WILL BE HELD FOR 5 DAYS BEFORE ISSUING A FINAL NEGATIVE REPORT Performed at [facility]    Report Status PENDING  Incomplete  Blood culture (routine x 2)     Status: None (Preliminary result)   Collection Time:  08/12/14  7:45 PM  Result Value Ref Range Status   Specimen Description BLOOD LEFT ARM  Final   Special Requests BOTTLES DRAWN AEROBIC AND ANAEROBIC 5CC  Final   Culture   Final           BLOOD CULTURE RECEIVED NO GROWTH TO DATE CULTURE WILL BE HELD FOR 5 DAYS BEFORE ISSUING A FINAL NEGATIVE REPORT Performed at [facility]    Report Status PENDING  Incomplete     Studies: Dg Chest Port 1 View  08/12/2014   CLINICAL DATA:  Cough, congestion, and shortness of breath since Thursday. Fever. Smoker.  EXAM: PORTABLE CHEST - 1 VIEW  COMPARISON:  03/02/2014.  FINDINGS: The heart size and mediastinal contours are within normal limits. Both lungs are clear. The visualized skeletal structures are unremarkable.  IMPRESSION: No active disease.   Electronically Signed   By: Lucienne Capers M.D.   On: 08/12/2014 23:59   US Breast Ltd Uni Right Inc Axilla  08/12/2014   CLINICAL DATA:  Right breast pain. Pain for 3 days. Fever. Redness and tenderness. Abscess versus cellulitis. Positive HIV. History of multiple abscesses. White cell count 4.2.  EXAM: ULTRASOUND OF THE right BREAST  COMPARISON:  None.  FINDINGS: Targeted ultrasound is performed, showing edema in the breast tissues consistent with cellulitis. No focal fluid collection demonstrated, suggesting no evidence of abscess. Note that this represents an incomplete emergent study and does not provide comprehensive evaluation of the right breast.  IMPRESSION: Edema, likely indicating cellulitis. No abscess identified in the area of concern.  RECOMMENDATION: Follow-up diagnostic imaging in the breast center is recommended as soon as possible for complete evaluation of the abnormality.  BI-RADS CATEGORY:  3: Probably benign finding(s) - short interval follow-up suggested.   Electronically Signed   By: Lucienne Capers M.D.   On: 08/12/2014 22:48    Scheduled Meds: . enoxaparin (LOVENOX) injection  40 mg Subcutaneous Q24H  . piperacillin-tazobactam  (ZOSYN)  IV  3.375 g Intravenous 3 times per day  . vancomycin  750 mg Intravenous Q12H   Continuous Infusions:    Time spent: > 35 minutes    Velvet Bathe  [facility] Pager [phone] If 7PM-7AM, please contact night-coverage at [email or web address], password [facility] 08/14/2014, 12:55 PM  LOS: 2 days

## 2014-08-15 DIAGNOSIS — R110 Nausea: Secondary | ICD-10-CM

## 2014-08-15 LAB — CBC
HEMATOCRIT: 25 % — AB (ref 36.0–46.0)
HEMOGLOBIN: 7.6 g/dL — AB (ref 12.0–15.0)
MCH: 25.7 pg — ABNORMAL LOW (ref 26.0–34.0)
MCHC: 30.4 g/dL (ref 30.0–36.0)
MCV: 84.5 fL (ref 78.0–100.0)
Platelets: 108 10*3/uL — ABNORMAL LOW (ref 150–400)
RBC: 2.96 MIL/uL — AB (ref 3.87–5.11)
RDW: 16.8 % — ABNORMAL HIGH (ref 11.5–15.5)
WBC: 2.6 10*3/uL — AB (ref 4.0–10.5)

## 2014-08-15 LAB — BASIC METABOLIC PANEL
Anion gap: 6 (ref 5–15)
BUN: 12 mg/dL (ref 6–23)
CALCIUM: 7.9 mg/dL — AB (ref 8.4–10.5)
CO2: 22 mmol/L (ref 19–32)
Chloride: 107 mmol/L (ref 96–112)
Creatinine, Ser: 0.82 mg/dL (ref 0.50–1.10)
GFR calc Af Amer: >90 mL/min (ref >90)
GLUCOSE: 79 mg/dL (ref 70–99)
Potassium: 3.6 mmol/L (ref 3.5–5.1)
Sodium: 135 mmol/L (ref 135–145)

## 2014-08-15 LAB — HEPATITIS C ANTIBODY: HCV AB: NEGATIVE

## 2014-08-15 LAB — RAPID URINE DRUG SCREEN, HOSP PERFORMED
AMPHETAMINES: NOT DETECTED
Barbiturates: NOT DETECTED
Benzodiazepines: NOT DETECTED
Cocaine: NOT DETECTED
OPIATES: POSITIVE — AB
Tetrahydrocannabinol: POSITIVE — AB

## 2014-08-15 LAB — RPR: RPR Ser Ql: NONREACTIVE

## 2014-08-15 NOTE — Progress Notes (Signed)
    [facility] for Infectious Disease    Date of Admission:  08/12/2014   Total days of antibiotics 4        Day 2 cefazolin        Day 1 bactrim proph/ day 1 azithrom q wk   ID: Beverly [name] is a 34 y.o. female with advanced HIV disease off of ART presents with right breast cellulitis Principal Problem:   Cellulitis of breast Active Problems:   Human immunodeficiency virus (HIV) disease   Thrombocytopenia   Normocytic anemia   Hypokalemia    Subjective: Having nausea after receving oral abtx today .still having tenderness and firmness to right breast  Medications:  . azithromycin  1,200 mg Oral Weekly  .  ceFAZolin (ANCEF) IV  1 g Intravenous Q8H  . darunavir  800 mg Oral Q breakfast  . emtricitabine-tenofovir  1 tablet Oral Daily  . enoxaparin (LOVENOX) injection  40 mg Subcutaneous Q24H  . hydrocerin   Topical BID  . ritonavir  100 mg Oral Q breakfast  . sulfamethoxazole-trimethoprim  1 tablet Oral Once per day on Mon Wed Fri    Objective: Vital signs in last 24 hours: Temp:  [98.1 F (36.7 C)-98.3 F (36.8 C)] 98.3 F (36.8 C) (02/24 1319) Pulse Rate:  [78-90] 90 (02/24 1319) Resp:  [16-20] 16 (02/24 1319) BP: (106-111)/(68-76) 106/68 mmHg (02/24 1319) SpO2:  [100 %] 100 % (02/24 1319)  Physical Exam  Constitutional:  oriented to person, place, and time. appears well-developed and well-nourished. No distress.  HENT:  Mouth/Throat: Oropharynx is clear and moist. No oropharyngeal exudate.  Right breast = still lateral upper quarter, lower quarter is indurated, slight erythema Lymphadenopathy: no cervical adenopathy.  Neurological: alert and oriented to person, place, and time.  Skin: Skin is warm and dry. No rash noted. No erythema.  Psychiatric: a normal mood and affect. behavior is normal.    Lab Results  Recent Labs  08/13/14 0426 08/14/14 0434 08/14/14 1347 08/15/14 0416  WBC 3.7*  --  4.1 2.6*  HGB 7.5*  --  8.1* 7.6*  HCT 24.7*  --   26.1* 25.0*  NA 137  --   --  135  K 3.2* 3.7  --  3.6  CL 108  --   --  107  CO2 24  --   --  22  BUN 16  --   --  12  CREATININE 0.78  --   --  0.82   Liver Panel  Recent Labs  08/13/14 0426  PROT 7.5  ALBUMIN 2.4*  AST 32  ALT 12  ALKPHOS 76  BILITOT 0.7    Assessment/Plan: Right breast cellulitis = continue with cefazolin 1gm q 8hr. Slow to respond to therapy  hiv = restarted on truvada/darunavir/ritonavir daily  oi proph = continue on m w-f bactrim ds and weekly azithromycin, given today  Nausea = likely from azithromycin. May need to premedicate in the future  [place], [facility] Cell: [phone] Pager: [phone]  08/15/2014, 4:29 PM

## 2014-08-15 NOTE — Progress Notes (Signed)
[facility]                                                                              Patient Demographics  Beverly [name], is a 34 y.o. female, DOB - [birth year], GYK:[id number]  Admit date - 08/12/2014   Admitting Physician Pamella Pert, MD  Outpatient Primary MD for the patient is No primary care provider on file.  LOS - 3   No chief complaint on file.     HPI on 08/13/2014 by Dr. Gean Birchwood Beverly [name] is a 34 y.o. female with known history of AIDS who has been noncompliant with her medications and last CD4 count in the system is in April 2015 which was less than 10 presents to the ER because of pain and redness in the right breast on the lateral aspect. Patient denies any trauma or any discharge from the nipples. Patient has been having subjective feeling of fever and chills. In the ER patient was initially tachycardic and febrile. Sonogram of the breast done showed no definite abscess but did show fluid concerning for cellulitis. Patient has been started on IV antibiotics after blood cultures obtained and patient admitted for further management. Patient denies any chest pain shortness of breath nausea vomiting abdominal pain diarrhea.  Assessment & Plan   Right breast cellulitis -Patient initially placed on vancomycin and Zosyn, which were discontinued -Ultrasound of the breast: no abscess -Continue cefazolin 1 g IV every 8 hours -Infectious disease consulted and appreciated  HIV disease -Viral load and genotype pending -Continue truvada, darunavir, ritonavir -Patient admitted to previous hospitalist that she had not taken her medications for months  Thrombocytopenia -appears to be stable, continue to monitor CBC  Chronic Normocytic anemia -Will continue to monitor CBC -Baseline appears to be 8-9  Hypokalemia -Resolved -Will continue to monitor and replace as needed  Code Status: Full  Family Communication: None at bedside  Disposition Plan:  Admitted, will continue IV antibiotics  Time Spent in minutes   30 minutes  Procedures  [place] Right Breast  Consults   Infectious disease  DVT Prophylaxis  lovenox  Lab Results  Component Value Date   PLT 108* 08/15/2014    Medications  Scheduled Meds: . azithromycin  1,200 mg Oral Weekly  .  ceFAZolin (ANCEF) IV  1 g Intravenous Q8H  . darunavir  800 mg Oral Q breakfast  . emtricitabine-tenofovir  1 tablet Oral Daily  . enoxaparin (LOVENOX) injection  40 mg Subcutaneous Q24H  . hydrocerin   Topical BID  . ritonavir  100 mg Oral Q breakfast  . sulfamethoxazole-trimethoprim  1 tablet Oral Once per day on Mon Wed Fri   Continuous Infusions:  PRN Meds:.acetaminophen **OR** acetaminophen, HYDROcodone-acetaminophen, meloxicam, ondansetron **OR** ondansetron (ZOFRAN) IV, zolpidem  Antibiotics    Anti-infectives    Start     Dose/Rate Route Frequency Ordered Stop   08/15/14 1000  azithromycin (ZITHROMAX) tablet 1,200 mg     1,200 mg Oral Weekly 08/14/14 1503     08/15/14 0900  sulfamethoxazole-trimethoprim (BACTRIM DS,SEPTRA DS) 800-160 MG per tablet 1 tablet     1 tablet Oral Once per day on Mon Wed Fri 08/14/14 1503  08/14/14 1700  emtricitabine-tenofovir (TRUVADA) 200-300 MG per tablet 1 tablet     1 tablet Oral Daily 08/14/14 1503     08/14/14 1700  Darunavir Ethanolate (PREZISTA) tablet 800 mg     800 mg Oral Daily with breakfast 08/14/14 1503     08/14/14 1700  ritonavir (NORVIR) tablet 100 mg     100 mg Oral Daily with breakfast 08/14/14 1503     08/14/14 1600  ceFAZolin (ANCEF) IVPB 1 g/50 mL premix     1 g 100 mL/hr over 30 Minutes Intravenous Every 8 hours 08/14/14 1500     08/13/14 0800  vancomycin (VANCOCIN) IVPB 750 mg/150 ml premix  Status:  Discontinued     750 mg 150 mL/hr over 60 Minutes Intravenous Every 12 hours 08/13/14 0204 08/14/14 1500   08/13/14 0600  piperacillin-tazobactam (ZOSYN) IVPB 3.375 g  Status:  Discontinued     3.375 g 12.5 mL/hr over  240 Minutes Intravenous 3 times per day 08/13/14 0204 08/14/14 1500   08/13/14 0015  piperacillin-tazobactam (ZOSYN) IVPB 3.375 g     3.375 g 100 mL/hr over 30 Minutes Intravenous  Once 08/13/14 0009 08/13/14 0058   08/13/14 0015  vancomycin (VANCOCIN) IVPB 1000 mg/200 mL premix  Status:  Discontinued     1,000 mg 200 mL/hr over 60 Minutes Intravenous  Once 08/13/14 0009 08/13/14 0013   08/12/14 1945  vancomycin (VANCOCIN) IVPB 1000 mg/200 mL premix     1,000 mg 200 mL/hr over 60 Minutes Intravenous  Once 08/12/14 1943 08/12/14 2100        Subjective:   Beverly [name] seen and examined today.  Patient has no complaints of shortness of breath, chest pain, headache, dizziness, abdominal pain.  She complains of increased firmness in her right breast.  Objective:   Filed Vitals:   08/14/14 0651 08/14/14 1255 08/15/14 0530 08/15/14 1319  BP: 105/67 97/59 111/76 106/68  Pulse: 82 88 78 90  Temp: 98.6 F (37 C) 97.9 F (36.6 C) 98.1 F (36.7 C) 98.3 F (36.8 C)  TempSrc: Oral Oral Oral Oral  Resp: 18 16 20 16   Height:      Weight:      SpO2: 100% 100% 100% 100%    Wt Readings from Last 3 Encounters:  08/13/14 53.8 kg (118 lb 9.7 oz)  03/10/14 58.968 kg (130 lb)  03/02/14 67.586 kg (149 lb)     Intake/Output Summary (Last 24 hours) at 08/15/14 1437 Last data filed at 08/15/14 3734  Gross per 24 hour  Intake   1074 ml  Output      0 ml  Net   1074 ml    Exam  General: Well developed, well nourished, NAD, appears stated age  HEENT: NCAT, mucous membranes moist.   Cardiovascular: S1 S2 auscultated, no rubs, murmurs or gallops. Regular rate and rhythm.  Respiratory: Clear to auscultation bilaterally with equal chest rise  Breast: Right breast with some erythema-lateral side.  No drainage.   Abdomen: Soft, nontender, nondistended, + bowel sounds  Extremities: warm dry without cyanosis clubbing or edema  Neuro: AAOx3, nonfocal  Data Review   Micro  Results Recent Results (from the past 240 hour(s))  Blood culture (routine x 2)     Status: None (Preliminary result)   Collection Time: 08/12/14  7:44 PM  Result Value Ref Range Status   Specimen Description BLOOD RIGHT ARM  Final   Special Requests BOTTLES DRAWN AEROBIC AND ANAEROBIC 5CC  Final   Culture  Final           BLOOD CULTURE RECEIVED NO GROWTH TO DATE CULTURE WILL BE HELD FOR 5 DAYS BEFORE ISSUING A FINAL NEGATIVE REPORT Performed at [facility]    Report Status PENDING  Incomplete  Blood culture (routine x 2)     Status: None (Preliminary result)   Collection Time: 08/12/14  7:45 PM  Result Value Ref Range Status   Specimen Description BLOOD LEFT ARM  Final   Special Requests BOTTLES DRAWN AEROBIC AND ANAEROBIC 5CC  Final   Culture   Final           BLOOD CULTURE RECEIVED NO GROWTH TO DATE CULTURE WILL BE HELD FOR 5 DAYS BEFORE ISSUING A FINAL NEGATIVE REPORT Performed at [facility]    Report Status PENDING  Incomplete  Urine culture     Status: None   Collection Time: 08/13/14  3:17 PM  Result Value Ref Range Status   Specimen Description URINE, CLEAN CATCH  Final   Special Requests NONE  Final   Colony Count NO GROWTH Performed at [facility]   Final   Culture NO GROWTH Performed at [facility]   Final   Report Status 08/14/2014 FINAL  Final    Radiology Reports Dg Chest Port 1 View  08/12/2014   CLINICAL DATA:  Cough, congestion, and shortness of breath since Thursday. Fever. Smoker.  EXAM: PORTABLE CHEST - 1 VIEW  COMPARISON:  03/02/2014.  FINDINGS: The heart size and mediastinal contours are within normal limits. Both lungs are clear. The visualized skeletal structures are unremarkable.  IMPRESSION: No active disease.   Electronically Signed   By: Lucienne Capers M.D.   On: 08/12/2014 23:59   US Breast Ltd Uni Right Inc Axilla  08/12/2014   CLINICAL DATA:  Right breast pain. Pain for 3 days. Fever. Redness and  tenderness. Abscess versus cellulitis. Positive HIV. History of multiple abscesses. White cell count 4.2.  EXAM: ULTRASOUND OF THE right BREAST  COMPARISON:  None.  FINDINGS: Targeted ultrasound is performed, showing edema in the breast tissues consistent with cellulitis. No focal fluid collection demonstrated, suggesting no evidence of abscess. Note that this represents an incomplete emergent study and does not provide comprehensive evaluation of the right breast.  IMPRESSION: Edema, likely indicating cellulitis. No abscess identified in the area of concern.  RECOMMENDATION: Follow-up diagnostic imaging in the breast center is recommended as soon as possible for complete evaluation of the abnormality.  BI-RADS CATEGORY:  3: Probably benign finding(s) - short interval follow-up suggested.   Electronically Signed   By: Lucienne Capers M.D.   On: 08/12/2014 22:48    CBC  Recent Labs Lab 08/12/14 1922 08/13/14 0426 08/14/14 1347 08/15/14 0416  WBC 4.2 3.7* 4.1 2.6*  HGB 9.3* 7.5* 8.1* 7.6*  HCT 29.2* 24.7* 26.1* 25.0*  PLT 109* 113* 98* 108*  MCV 83.2 84.3 84.5 84.5  MCH 26.5 25.6* 26.2 25.7*  MCHC 31.8 30.4 31.0 30.4  RDW 17.0* 16.9* 17.1* 16.8*  LYMPHSABS  --  0.3*  --   --   MONOABS  --  0.4  --   --   EOSABS  --  0.2  --   --   BASOSABS  --  0.0  --   --     Chemistries   Recent Labs Lab 08/12/14 1922 08/13/14 0426 08/14/14 0434 08/15/14 0416  NA 134* 137  --  135  K 3.4* 3.2* 3.7 3.6  CL 102 108  --  107  CO2 25 24  --  22  GLUCOSE 93 81  --  79  BUN 14 16  --  12  CREATININE 0.73 0.78  --  0.82  CALCIUM 8.4 7.3*  --  7.9*  AST  --  32  --   --   ALT  --  12  --   --   ALKPHOS  --  76  --   --   BILITOT  --  0.7  --   --    ------------------------------------------------------------------------------------------------------------------ estimated creatinine clearance is 74.3 mL/min (by C-G formula based on Cr of  0.82). ------------------------------------------------------------------------------------------------------------------ No results for input(s): HGBA1C in the last 72 hours. ------------------------------------------------------------------------------------------------------------------ No results for input(s): CHOL, HDL, LDLCALC, TRIG, CHOLHDL, LDLDIRECT in the last 72 hours. ------------------------------------------------------------------------------------------------------------------ No results for input(s): TSH, T4TOTAL, T3FREE, THYROIDAB in the last 72 hours.  Invalid input(s): FREET3 ------------------------------------------------------------------------------------------------------------------ No results for input(s): VITAMINB12, FOLATE, FERRITIN, TIBC, IRON, RETICCTPCT in the last 72 hours.  Coagulation profile No results for input(s): INR, PROTIME in the last 168 hours.  No results for input(s): DDIMER in the last 72 hours.  Cardiac Enzymes No results for input(s): CKMB, TROPONINI, MYOGLOBIN in the last 168 hours.  Invalid input(s): CK ------------------------------------------------------------------------------------------------------------------ Invalid input(s): POCBNP    Taraya Steward D.O. on 08/15/2014 at 2:37 PM  Between 7am to 7pm - Pager - [phone]  After 7pm go to [email or web address] - password [email or web address]  And look for the night coverage person covering for me after hours  [facility] Office  [phone]

## 2014-08-16 DIAGNOSIS — N644 Mastodynia: Secondary | ICD-10-CM | POA: Insufficient documentation

## 2014-08-16 LAB — CBC
HCT: 27.1 % — ABNORMAL LOW (ref 36.0–46.0)
Hemoglobin: 8.3 g/dL — ABNORMAL LOW (ref 12.0–15.0)
MCH: 25.5 pg — ABNORMAL LOW (ref 26.0–34.0)
MCHC: 30.6 g/dL (ref 30.0–36.0)
MCV: 83.4 fL (ref 78.0–100.0)
Platelets: 148 10*3/uL — ABNORMAL LOW (ref 150–400)
RBC: 3.25 MIL/uL — ABNORMAL LOW (ref 3.87–5.11)
RDW: 16.8 % — ABNORMAL HIGH (ref 11.5–15.5)
WBC: 2.6 10*3/uL — ABNORMAL LOW (ref 4.0–10.5)

## 2014-08-16 LAB — BASIC METABOLIC PANEL
Anion gap: 5 (ref 5–15)
BUN: 12 mg/dL (ref 6–23)
CHLORIDE: 108 mmol/L (ref 96–112)
CO2: 23 mmol/L (ref 19–32)
CREATININE: 1.08 mg/dL (ref 0.50–1.10)
Calcium: 8.1 mg/dL — ABNORMAL LOW (ref 8.4–10.5)
GFR calc Af Amer: 78 mL/min — ABNORMAL LOW (ref >90)
GFR, EST NON AFRICAN AMERICAN: 67 mL/min — AB (ref >90)
Glucose, Bld: 84 mg/dL (ref 70–99)
Potassium: 3.7 mmol/L (ref 3.5–5.1)
Sodium: 136 mmol/L (ref 135–145)

## 2014-08-16 MED ORDER — SULFAMETHOXAZOLE-TRIMETHOPRIM 800-160 MG PO TABS: 1.0000 | ORAL_TABLET | ORAL | Status: DC

## 2014-08-16 MED ORDER — CEPHALEXIN 500 MG PO CAPS: 500.0000 mg | ORAL_CAPSULE | Freq: Four times a day (QID) | ORAL | Status: DC

## 2014-08-16 MED ORDER — EMTRICITABINE-TENOFOVIR DF 200-300 MG PO TABS: 1.0000 | ORAL_TABLET | Freq: Every day | ORAL | Status: DC

## 2014-08-16 MED ORDER — DARUNAVIR ETHANOLATE 800 MG PO TABS: 800.0000 mg | ORAL_TABLET | Freq: Every day | ORAL | Status: DC

## 2014-08-16 MED ORDER — MELOXICAM 7.5 MG PO TABS: 7.5000 mg | ORAL_TABLET | Freq: Every day | ORAL | Status: DC | PRN

## 2014-08-16 MED ORDER — HYDROCERIN EX CREA: 1.0000 "application " | TOPICAL_CREAM | Freq: Two times a day (BID) | CUTANEOUS | Status: DC

## 2014-08-16 MED ORDER — RITONAVIR 100 MG PO TABS: 100.0000 mg | ORAL_TABLET | Freq: Every day | ORAL | Status: DC

## 2014-08-16 MED ORDER — AZITHROMYCIN 600 MG PO TABS: 1200.0000 mg | ORAL_TABLET | ORAL | Status: DC

## 2014-08-16 NOTE — Progress Notes (Signed)
Received discharge order.  DC instructions given. Prescription x 1 given for keflex also. No concerns voiced. Awaiting ride to be transported to home. Vwilliams,rn.

## 2014-08-16 NOTE — Discharge Instructions (Signed)

## 2014-08-16 NOTE — Discharge Summary (Signed)
. Physician Discharge Summary  Beverly [name] FBP:[id number] DOB: 10-19-1980 DOA: 08/12/2014  PCP: No primary care provider on file.  Admit date: 08/12/2014 Discharge date: 08/16/2014  Time spent: 45 minutes  Recommendations for Outpatient Follow-up:  Patient will be discharged to home.  She is to follow up with her primary care physician within one week of discharge. Patient should also follow-up with Dr. Baxter Flattery, office will contact patient. Patient should continue taking her medications as prescribed.  Patient to continue a regular diet and resume activity as tolerated.  Discharge Diagnoses:  Right breast cellulitis HIV disease Thrombocytopenia Chronic normocytic anemia Hypokalemia Eczema  Discharge Condition: Stable  Diet recommendation: Regular  Filed Weights   08/13/14 0018  Weight: 53.8 kg (118 lb 9.7 oz)    History of present illness:  on 08/13/2014 by Dr. Gean Birchwood Beverly [name] is a 34 y.o. female with known history of AIDS who has been noncompliant with her medications and last CD4 count in the system is in April 2015 which was less than 10 presents to the ER because of pain and redness in the right breast on the lateral aspect. Patient denies any trauma or any discharge from the nipples. Patient has been having subjective feeling of fever and chills. In the ER patient was initially tachycardic and febrile. Sonogram of the breast done showed no definite abscess but did show fluid concerning for cellulitis. Patient has been started on IV antibiotics after blood cultures obtained and patient admitted for further management. Patient denies any chest pain shortness of breath nausea vomiting abdominal pain diarrhea.  Hospital Course:  Right breast cellulitis -Patient initially placed on vancomycin and Zosyn, which were discontinued and patient placed on cefazolin. -Ultrasound of the breast: no abscess -Infectious disease consulted and appreciated -Patient will be  discharged with Keflex 500mg  4 times daily for an additional 10 days. (ID recommendation) -Patient will need to follow-up with infectious disease.  HIV disease -Viral load and genotype pending -Continue truvada, darunavir, ritonavir -Patient admitted to previous hospitalist that she had not taken her medications for months -We'll discharge patient with monthly supply of her medications. -Patient was also started on opportunistic infection prophylaxis with Bactrim as well as azithromycin.  Thrombocytopenia -appears to be stable, continue to monitor CBC  Chronic Normocytic anemia -Baseline appears to be 8-9 -Hemoglobin today 8.3 (from 7.6 yesterday with no intervention)  Hypokalemia -Resolved  Eczema -Keep skin hydrated and avoid picking at scabs  Procedures  [place] Right Breast  Consults  Infectious disease, Dr. Baxter Flattery  Discharge Exam: Filed Vitals:   08/16/14 0417  BP: 112/64  Pulse: 92  Temp: 98.1 F (36.7 C)  Resp: 18   Exam  General: Well developed, NAD  HEENT: NCAT, mucous membranes moist.   Cardiovascular: S1 S2 auscultated, no rubs, murmurs or gallops. Regular rate and rhythm.  Respiratory: Clear to auscultation bilaterally with equal chest rise  Breast: Right breast with some improving erythema and induration-lateral side. No drainage.  Abdomen: Soft, nontender, nondistended, + bowel sounds  Extremities: warm dry without cyanosis clubbing or edema  Skin: eczema, dryness  Neuro: AAOx3, nonfocal  Discharge Instructions      Discharge Instructions    Discharge instructions    Complete by:  As directed   Patient will be discharged to home.  She is to follow up with her primary care physician within one week of discharge. Patient should also follow-up with Dr. Baxter Flattery, office will contact patient. Patient should continue taking her medications as  prescribed.  Patient to continue a regular diet and resume activity as tolerated.              Medication List    STOP taking these medications        ciprofloxacin 500 MG tablet  Commonly known as:  CIPRO     HYDROcodone-acetaminophen 5-325 MG per tablet  Commonly known as:  NORCO/VICODIN     naproxen sodium 220 MG tablet  Commonly known as:  ANAPROX      TAKE these medications        azithromycin 600 MG tablet  Commonly known as:  ZITHROMAX  Take 2 tablets (1,200 mg total) by mouth once a week.     cephALEXin 500 MG capsule  Commonly known as:  KEFLEX  Take 1 capsule (500 mg total) by mouth 4 (four) times daily.     Darunavir Ethanolate 800 MG tablet  Commonly known as:  PREZISTA  Take 1 tablet (800 mg total) by mouth daily with breakfast.     emtricitabine-tenofovir 200-300 MG per tablet  Commonly known as:  TRUVADA  Take 1 tablet by mouth daily.     hydrocerin Crea  Apply 1 application topically 2 (two) times daily.     ibuprofen 200 MG tablet  Commonly known as:  ADVIL,MOTRIN  Take 400 mg by mouth every 6 (six) hours as needed (pain).     ibuprofen 800 MG tablet  Commonly known as:  ADVIL,MOTRIN  Take 1 tablet (800 mg total) by mouth 3 (three) times daily.     meloxicam 7.5 MG tablet  Commonly known as:  MOBIC  Take 1 tablet (7.5 mg total) by mouth daily as needed (moderate discomfort at affected breast).     potassium chloride SA 20 MEQ tablet  Commonly known as:  K-DUR,KLOR-CON  Take 1 tablet (20 mEq total) by mouth daily.     ritonavir 100 MG Tabs tablet  Commonly known as:  NORVIR  Take 1 tablet (100 mg total) by mouth daily with breakfast.     sulfamethoxazole-trimethoprim 800-160 MG per tablet  Commonly known as:  BACTRIM DS,SEPTRA DS  Take 1 tablet by mouth 3 (three) times a week.       Allergies  Allergen Reactions  . Bee Venom Anaphylaxis, Shortness Of Breath and Swelling  . Morphine And Related Hives and Itching    Just can't take "morphine", vicodin is OK   Follow-up Information    Follow up with Carlyle Basques, MD.  Schedule an appointment as soon as possible for a visit in 1 week.   Specialty:  Infectious Diseases   Why:  Hospital followup   Contact information:   [address] [place] [address] [phone]        The results of significant diagnostics from this hospitalization (including imaging, microbiology, ancillary and laboratory) are listed below for reference.    Significant Diagnostic Studies: Dg Chest Port 1 View  08/12/2014   CLINICAL DATA:  Cough, congestion, and shortness of breath since Thursday. Fever. Smoker.  EXAM: PORTABLE CHEST - 1 VIEW  COMPARISON:  03/02/2014.  FINDINGS: The heart size and mediastinal contours are within normal limits. Both lungs are clear. The visualized skeletal structures are unremarkable.  IMPRESSION: No active disease.   Electronically Signed   By: Lucienne Capers M.D.   On: 08/12/2014 23:59   US Breast Ltd Uni Right Inc Axilla  08/12/2014   CLINICAL DATA:  Right breast pain. Pain for 3 days.  Fever. Redness and tenderness. Abscess versus cellulitis. Positive HIV. History of multiple abscesses. White cell count 4.2.  EXAM: ULTRASOUND OF THE right BREAST  COMPARISON:  None.  FINDINGS: Targeted ultrasound is performed, showing edema in the breast tissues consistent with cellulitis. No focal fluid collection demonstrated, suggesting no evidence of abscess. Note that this represents an incomplete emergent study and does not provide comprehensive evaluation of the right breast.  IMPRESSION: Edema, likely indicating cellulitis. No abscess identified in the area of concern.  RECOMMENDATION: Follow-up diagnostic imaging in the breast center is recommended as soon as possible for complete evaluation of the abnormality.  BI-RADS CATEGORY:  3: Probably benign finding(s) - short interval follow-up suggested.   Electronically Signed   By: Lucienne Capers M.D.   On: 08/12/2014 22:48    Microbiology: Recent Results (from the past 240 hour(s))  Blood  culture (routine x 2)     Status: None (Preliminary result)   Collection Time: 08/12/14  7:44 PM  Result Value Ref Range Status   Specimen Description BLOOD RIGHT ARM  Final   Special Requests BOTTLES DRAWN AEROBIC AND ANAEROBIC 5CC  Final   Culture   Final           BLOOD CULTURE RECEIVED NO GROWTH TO DATE CULTURE WILL BE HELD FOR 5 DAYS BEFORE ISSUING A FINAL NEGATIVE REPORT Performed at [facility]    Report Status PENDING  Incomplete  Blood culture (routine x 2)     Status: None (Preliminary result)   Collection Time: 08/12/14  7:45 PM  Result Value Ref Range Status   Specimen Description BLOOD LEFT ARM  Final   Special Requests BOTTLES DRAWN AEROBIC AND ANAEROBIC 5CC  Final   Culture   Final           BLOOD CULTURE RECEIVED NO GROWTH TO DATE CULTURE WILL BE HELD FOR 5 DAYS BEFORE ISSUING A FINAL NEGATIVE REPORT Performed at [facility]    Report Status PENDING  Incomplete  Urine culture     Status: None   Collection Time: 08/13/14  3:17 PM  Result Value Ref Range Status   Specimen Description URINE, CLEAN CATCH  Final   Special Requests NONE  Final   Colony Count NO GROWTH Performed at [facility]   Final   Culture NO GROWTH Performed at [facility]   Final   Report Status 08/14/2014 FINAL  Final     Labs: Basic Metabolic Panel:  Recent Labs Lab 08/12/14 1922 08/13/14 0426 08/14/14 0434 08/15/14 0416 08/16/14 0506  NA 134* 137  --  135 136  K 3.4* 3.2* 3.7 3.6 3.7  CL 102 108  --  107 108  CO2 25 24  --  22 23  GLUCOSE 93 81  --  79 84  BUN 14 16  --  12 12  CREATININE 0.73 0.78  --  0.82 1.08  CALCIUM 8.4 7.3*  --  7.9* 8.1*   Liver Function Tests:  Recent Labs Lab 08/13/14 0426  AST 32  ALT 12  ALKPHOS 76  BILITOT 0.7  PROT 7.5  ALBUMIN 2.4*   No results for input(s): LIPASE, AMYLASE in the last 168 hours. No results for input(s): AMMONIA in the last 168 hours. CBC:  Recent Labs Lab 08/12/14 1922  08/13/14 0426 08/14/14 1347 08/15/14 0416 08/16/14 0506  WBC 4.2 3.7* 4.1 2.6* 2.6*  NEUTROABS  --  2.8  --   --   --  HGB 9.3* 7.5* 8.1* 7.6* 8.3*  HCT 29.2* 24.7* 26.1* 25.0* 27.1*  MCV 83.2 84.3 84.5 84.5 83.4  PLT 109* 113* 98* 108* 148*   Cardiac Enzymes: No results for input(s): CKTOTAL, CKMB, CKMBINDEX, TROPONINI in the last 168 hours. BNP: BNP (last 3 results) No results for input(s): BNP in the last 8760 hours.  ProBNP (last 3 results) No results for input(s): PROBNP in the last 8760 hours.  CBG: No results for input(s): GLUCAP in the last 168 hours.     SignedCristal Ford  [facility] 08/16/2014, 11:38 AM

## 2014-08-16 NOTE — Plan of Care (Signed)
Problem: Discharge Progression Outcomes Goal: Patient verbalizes wound care regimen Outcome: Completed/Met Date Met:  08/16/14 Regarding applying cream to eczema.

## 2014-08-16 NOTE — Progress Notes (Signed)
Pt discharged to home. Left unit in good condition ambulating to checkout.  Went to meet ride downstairs. Vwilliams,rn.

## 2014-08-19 LAB — CULTURE, BLOOD (ROUTINE X 2)
CULTURE: NO GROWTH
Culture: NO GROWTH

## 2014-08-20 LAB — HLA B*5701: HLA B 5701: NEGATIVE

## 2014-08-23 LAB — REFLEX TO GENOSURE(R) MG: HIV GenoSure(R) MG PDF: 0

## 2014-08-23 LAB — HIV-1 RNA ULTRAQUANT REFLEX TO GENTYP+
HIV-1 RNA BY PCR: 245010 {copies}/mL
HIV-1 RNA Quant, Log: 5.389 log10copy/mL

## 2014-09-11 ENCOUNTER — Inpatient Hospital Stay: Payer: Medicaid Other | Admitting: Internal Medicine

## 2014-09-27 ENCOUNTER — Inpatient Hospital Stay: Payer: Medicaid Other | Admitting: Internal Medicine

## 2014-12-08 ENCOUNTER — Encounter (HOSPITAL_COMMUNITY): Payer: Self-pay | Admitting: *Deleted

## 2014-12-08 ENCOUNTER — Emergency Department (HOSPITAL_COMMUNITY)
Admission: EM | Admit: 2014-12-08 | Discharge: 2014-12-09 | Disposition: A | Discharge status: Home / Self Care | Payer: Medicaid Other | Attending: Emergency Medicine | Admitting: Emergency Medicine

## 2014-12-08 DIAGNOSIS — C460 Kaposi's sarcoma of skin: Secondary | ICD-10-CM | POA: Diagnosis not present

## 2014-12-08 DIAGNOSIS — J45909 Unspecified asthma, uncomplicated: Secondary | ICD-10-CM | POA: Diagnosis not present

## 2014-12-08 DIAGNOSIS — Z8659 Personal history of other mental and behavioral disorders: Secondary | ICD-10-CM | POA: Diagnosis not present

## 2014-12-08 DIAGNOSIS — R21 Rash and other nonspecific skin eruption: Secondary | ICD-10-CM | POA: Diagnosis present

## 2014-12-08 DIAGNOSIS — Z720 Tobacco use: Secondary | ICD-10-CM | POA: Diagnosis not present

## 2014-12-08 DIAGNOSIS — Z79899 Other long term (current) drug therapy: Secondary | ICD-10-CM | POA: Diagnosis not present

## 2014-12-08 DIAGNOSIS — C469 Kaposi's sarcoma, unspecified: Secondary | ICD-10-CM

## 2014-12-08 DIAGNOSIS — L03115 Cellulitis of right lower limb: Secondary | ICD-10-CM | POA: Diagnosis not present

## 2014-12-08 DIAGNOSIS — B20 Human immunodeficiency virus [HIV] disease: Secondary | ICD-10-CM | POA: Insufficient documentation

## 2014-12-08 NOTE — ED Notes (Addendum)
Pt reports while she was in jail, she noticed a small rash on her R anterior LE x 2 weeks ago.  Reports itching.  States rash is not getting any better and is not making her R foot hurt.  Pt limps when ambulating.  Pt reports she thought it was a ring worm at firs and had kept it clean but it kept spreading.    Pt is also requesting a "mental evaluation" tonight d/t anxiety.

## 2014-12-09 LAB — CBC WITH DIFFERENTIAL/PLATELET
BASOS ABS: 0 10*3/uL (ref 0.0–0.1)
Basophils Relative: 0 % (ref 0–1)
Eosinophils Absolute: 0.3 10*3/uL (ref 0.0–0.7)
Eosinophils Relative: 11 % — ABNORMAL HIGH (ref 0–5)
HCT: 25.6 % — ABNORMAL LOW (ref 36.0–46.0)
Hemoglobin: 8.1 g/dL — ABNORMAL LOW (ref 12.0–15.0)
LYMPHS PCT: 23 % (ref 12–46)
Lymphs Abs: 0.5 10*3/uL — ABNORMAL LOW (ref 0.7–4.0)
MCH: 26.3 pg (ref 26.0–34.0)
MCHC: 31.6 g/dL (ref 30.0–36.0)
MCV: 83.1 fL (ref 78.0–100.0)
MONO ABS: 0.2 10*3/uL (ref 0.1–1.0)
MONOS PCT: 7 % (ref 3–12)
Neutro Abs: 1.3 10*3/uL — ABNORMAL LOW (ref 1.7–7.7)
Neutrophils Relative %: 59 % (ref 43–77)
Platelets: 134 10*3/uL — ABNORMAL LOW (ref 150–400)
RBC: 3.08 MIL/uL — ABNORMAL LOW (ref 3.87–5.11)
RDW: 16.4 % — ABNORMAL HIGH (ref 11.5–15.5)
WBC: 2.3 10*3/uL — AB (ref 4.0–10.5)

## 2014-12-09 LAB — COMPREHENSIVE METABOLIC PANEL
ALT: 17 U/L (ref 14–54)
ANION GAP: 4 — AB (ref 5–15)
AST: 33 U/L (ref 15–41)
Albumin: 3 g/dL — ABNORMAL LOW (ref 3.5–5.0)
Alkaline Phosphatase: 92 U/L (ref 38–126)
BUN: 16 mg/dL (ref 6–20)
CALCIUM: 8 mg/dL — AB (ref 8.9–10.3)
CHLORIDE: 112 mmol/L — AB (ref 101–111)
CO2: 24 mmol/L (ref 22–32)
CREATININE: 0.62 mg/dL (ref 0.44–1.00)
Glucose, Bld: 88 mg/dL (ref 65–99)
POTASSIUM: 2.8 mmol/L — AB (ref 3.5–5.1)
Sodium: 140 mmol/L (ref 135–145)
TOTAL PROTEIN: 8.2 g/dL — AB (ref 6.5–8.1)
Total Bilirubin: 0.5 mg/dL (ref 0.3–1.2)

## 2014-12-09 MED ORDER — OXYCODONE-ACETAMINOPHEN 5-325 MG PO TABS
1.0000 | ORAL_TABLET | Freq: Once | ORAL | Status: AC
  Administered 2014-12-09: 1 via ORAL
  Filled 2014-12-09: qty 1

## 2014-12-09 MED ORDER — CLINDAMYCIN PHOSPHATE 300 MG/50ML IV SOLN
300.0000 mg | Freq: Once | INTRAVENOUS | Status: AC
  Administered 2014-12-09: 300 mg via INTRAVENOUS
  Filled 2014-12-09: qty 50

## 2014-12-09 MED ORDER — POTASSIUM CHLORIDE CRYS ER 20 MEQ PO TBCR
40.0000 meq | EXTENDED_RELEASE_TABLET | Freq: Once | ORAL | Status: AC
  Administered 2014-12-09: 40 meq via ORAL
  Filled 2014-12-09: qty 2

## 2014-12-09 MED ORDER — CLINDAMYCIN HCL 300 MG PO CAPS: 300.0000 mg | ORAL_CAPSULE | Freq: Four times a day (QID) | ORAL | Status: DC

## 2014-12-09 NOTE — Discharge Instructions (Signed)
Kaposi's Sarcoma Kaposi's sarcoma is a cancer of vascular tissue. It is caused by a virus called human herpes virus 8 (HHV-8). The tumors of Kaposi's sarcoma appear as red or purple patches or raised bumps (papules) on the skin, mouth, lungs, liver, and gastrointestinal tract. There are 4 types of Kaposi's sarcoma:  Classic Kaposi's sarcoma: A rare, slow-growing skin tumor. It usually affects males of [place] or [place] European Jewish ancestry.  African Kaposi's sarcoma: Occurs frequently among young males in certain countries in [place]. Often, it is a slow-growing tumor. In some cases, the skin tumors invade bone and tissue under the skin.  Immunosuppressive treatment-related Kaposi's sarcoma: May develop in people who are taking immunosuppressive drugs after an organ transplant. It sometimes improves if the medicine is reduced or changed.  AIDS-related Kaposi's sarcoma: Affects people with late-stage HIV/AIDS. It is often a rapidly progressing tumor. It affects the skin, lymph nodes, gastrointestinal tract, lungs, liver, and spleen. The number of cases is decreasing due to highly active antiretroviral therapy (HAART) for HIV/AIDS. CAUSES  It is believed that Kaposi's sarcoma is caused by or has a strong association with HHV-8. Exactly how this virus causes Kaposi's sarcoma is still being researched. Weakening of the body's natural defense system (immune system) appears to be a risk factor for developing Kaposi's sarcoma. Some causes of a weak immune system include:  Taking immunosuppressive drugs.  HIV.  Lymphoma. SYMPTOMS  The first signs are often red, purple, or Jaworski patches, plaques, or nodules on the skin. This often results in a bruise-like appearance. In classic, African, and immunosuppressive treatment-related Kaposi's sarcoma:  Lesions often grow slowly and develop over years.  The lower legs may swell as the disease gets worse.  In some cases, the disease will  spread to other organs. In AIDS-related Kaposi's sarcoma:  The tumor grows rapidly. It often covers large areas, forming tumor-like masses. These tumors start out soft and spongy and often become hard over time. The tumor surface may form open ulcers. These ulcers may become infected.  This disease often affects the mouth, lymph nodes, lungs, liver, spleen, and gastrointestinal tract.  When the tumor involves the lungs, it often causes coughing, shortness of breath, and wheezing. The disease often progresses rapidly in the lungs. It can result in death from respiratory failure.  When the disease involves the bowel, it rarely causes problems. However, if the disease is very advanced, it may cause symptoms of intestinal obstruction (nausea, vomiting, abdominal pain) or bloody stools.  When the lymph nodes are involved, severe swelling of the legs or face can occur. DIAGNOSIS  If your caregiver suspects Kaposi's sarcoma, he or she will ask about your medical history. If you do not know whether you are infected with HIV, your caregiver may recommend an HIV test. Kaposi's sarcoma can be confirmed by taking a tissue sample (biopsy). If you have Kaposi's sarcoma, your caregiver will try to determine how far it has spread by asking you questions and doing a physical exam. PREVENTION  There is no way to prevent the milder forms of Kaposi's sarcoma. The most effective way to avoid AIDS-related Kaposi's sarcoma is to prevent the transmission of HIV. People who are HIV positive can decrease their risk of Kaposi's sarcoma by taking HAART. TREATMENT  There is no cure for Kaposi's sarcoma. It is a lifelong condition. The goal of treatment is to ease symptoms, shrink the tumor, and prevent the disease from getting worse. The type of treatment advised depends on:  The type of Kaposi's sarcoma.  The size of the tumor.  Whether internal organs are involved.  Results of a test to measure the strength of your  immune system (CD4 count).  Your general medical condition. Treatments include:  Surgery to remove cancer cells.  Destroying cancer cells by freezing (cryotherapy).  Using cancer-fighting drugs (chemotherapy).  Using high-energy rays to kill or shrink tumors (radiation therapy).  Using the body's immune system to fight the cancer cells (biological therapy). In people with AIDS, the lesions will improve with treatment of the AIDS itself. Typically, this involves antiretroviral medicines prescribed to improve the functioning of the immune system. SEEK MEDICAL CARE IF:   You have skin changes that fit the description of Kaposi's sarcoma.  You have symptoms suggestive of HIV/AIDS, including:  Swollen lymph nodes.  Night sweats.  Fever.  Fatigue.  Weight loss. FOR MORE INFORMATION  American Cancer Society: www.cancer.[place]: www.cancer.gov Document Released: 05/14/2004 Document Revised: 02/08/2013 Document Reviewed: 09/13/2013 [facility] Patient Information 2015 [place], [place]. This information is not intended to replace advice given to you by your health care provider. Make sure you discuss any questions you have with your health care provider.  Cellulitis Cellulitis is an infection of the skin and the tissue beneath it. The infected area is usually red and tender. Cellulitis occurs most often in the arms and lower legs.  CAUSES  Cellulitis is caused by bacteria that enter the skin through cracks or cuts in the skin. The most common types of bacteria that cause cellulitis are staphylococci and streptococci. SIGNS AND SYMPTOMS   Redness and warmth.  Swelling.  Tenderness or pain.  Fever. DIAGNOSIS  Your health care provider can usually determine what is wrong based on a physical exam. Blood tests may also be done. TREATMENT  Treatment usually involves taking an antibiotic medicine. HOME CARE INSTRUCTIONS   Take your antibiotic medicine as  directed by your health care provider. Finish the antibiotic even if you start to feel better.  Keep the infected arm or leg elevated to reduce swelling.  Apply a warm cloth to the affected area up to 4 times per day to relieve pain.  Take medicines only as directed by your health care provider.  Keep all follow-up visits as directed by your health care provider. SEEK MEDICAL CARE IF:   You notice red streaks coming from the infected area.  Your red area gets larger or turns dark in color.  Your bone or joint underneath the infected area becomes painful after the skin has healed.  Your infection returns in the same area or another area.  You notice a swollen bump in the infected area.  You develop new symptoms.  You have a fever. SEEK IMMEDIATE MEDICAL CARE IF:   You feel very sleepy.  You develop vomiting or diarrhea.  You have a general ill feeling (malaise) with muscle aches and pains. MAKE SURE YOU:   Understand these instructions.  Will watch your condition.  Will get help right away if you are not doing well or get worse. Document Released: 03/18/2005 Document Revised: 10/23/2013 Document Reviewed: 08/24/2011 [facility] Patient Information 2015 [place], [place]. This information is not intended to replace advice given to you by your health care provider. Make sure you discuss any questions you have with your health care provider.

## 2014-12-09 NOTE — ED Provider Notes (Signed)
CSN: [id number]     Arrival date & time 12/08/14  2323 History   First MD Initiated Contact with Patient 12/09/14 0009     Chief Complaint  Patient presents with  . Rash  . Leg Pain     (Consider location/radiation/quality/duration/timing/severity/associated sxs/prior Treatment) Patient is a 34 y.o. female presenting with rash and leg pain.  Rash Location: R shin. Quality: dryness, painful, peeling, redness and scaling   Pain details:    Quality:  Burning   Severity:  Moderate   Onset quality:  Gradual   Duration:  2 weeks   Timing:  Constant   Progression:  Unchanged Severity:  Moderate Onset quality:  Gradual Duration:  2 weeks Timing:  Constant Progression:  Worsening Chronicity:  New Context: not insect bite/sting   Relieved by:  Nothing Worsened by:  Nothing tried Ineffective treatments:  None tried Associated symptoms: no abdominal pain, no fever, no nausea and not vomiting   Leg Pain Associated symptoms: no fever     Past Medical History  Diagnosis Date  . Anxiety   . Depression   . HIV infection   . Asthma   . Abscess    Past Surgical History  Procedure Laterality Date  . Cesarean section     Family History  Problem Relation Age of Onset  . Hypertension Other   . Cancer Other   . Diabetes Other   . Asthma Other    History  Substance Use Topics  . Smoking status: Current Every Day Smoker -- 0.30 packs/day for 3 years    Types: Cigarettes  . Smokeless tobacco: Never Used  . Alcohol Use: Yes     Comment: occasional   OB History    No data available     Review of Systems  Constitutional: Negative for fever.  Gastrointestinal: Negative for nausea, vomiting and abdominal pain.  Skin: Positive for rash.  All other systems reviewed and are negative.     Allergies  Bee venom and Morphine and related  Home Medications   Prior to Admission medications   Medication Sig Start Date End Date Taking? Authorizing Provider  Darunavir  Ethanolate (PREZISTA) 800 MG tablet Take 1 tablet (800 mg total) by mouth daily with breakfast. 08/16/14  Yes Maryann Mikhail, DO  emtricitabine-tenofovir (TRUVADA) 200-300 MG per tablet Take 1 tablet by mouth daily. 08/16/14  Yes Maryann Mikhail, DO  hydrocerin (EUCERIN) CREA Apply 1 application topically 2 (two) times daily. 08/16/14  Yes Maryann Mikhail, DO  ibuprofen (ADVIL,MOTRIN) 200 MG tablet Take 400 mg by mouth every 6 (six) hours as needed (pain).    Yes Historical Provider, MD  ritonavir (NORVIR) 100 MG TABS tablet Take 1 tablet (100 mg total) by mouth daily with breakfast. 08/16/14  Yes Maryann Mikhail, DO  azithromycin (ZITHROMAX) 600 MG tablet Take 2 tablets (1,200 mg total) by mouth once a week. Patient not taking: Reported on 12/09/2014 08/16/14   Velta Addison Mikhail, DO  cephALEXin (KEFLEX) 500 MG capsule Take 1 capsule (500 mg total) by mouth 4 (four) times daily. Patient not taking: Reported on 12/09/2014 08/16/14   Velta Addison Mikhail, DO  clindamycin (CLEOCIN) 300 MG capsule Take 1 capsule (300 mg total) by mouth 4 (four) times daily. X 7 days 12/09/14   Debby Freiberg, MD  meloxicam (MOBIC) 7.5 MG tablet Take 1 tablet (7.5 mg total) by mouth daily as needed (moderate discomfort at affected breast). Patient not taking: Reported on 12/09/2014 08/16/14   Velta Addison Mikhail, DO  potassium chloride SA (  K-DUR,KLOR-CON) 20 MEQ tablet Take 1 tablet (20 mEq total) by mouth daily. Patient not taking: Reported on 05/23/2014 03/02/14 03/09/14  Domenic Moras, PA-C  sulfamethoxazole-trimethoprim (BACTRIM DS,SEPTRA DS) 800-160 MG per tablet Take 1 tablet by mouth 3 (three) times a week. Patient not taking: Reported on 12/09/2014 08/16/14   Maryann Mikhail, DO   BP 119/73 mmHg  Pulse 72  Temp(Src) 98.2 F (36.8 C) (Oral)  Resp 14  SpO2 100% Physical Exam  Constitutional: She is oriented to person, place, and time. She appears well-developed and well-nourished.  HENT:  Head: Normocephalic and atraumatic.  Right  Ear: External ear normal.  Left Ear: External ear normal.  Eyes: Conjunctivae and EOM are normal. Pupils are equal, round, and reactive to light.  Neck: Normal range of motion. Neck supple.  Cardiovascular: Normal rate, regular rhythm, normal heart sounds and intact distal pulses.   Pulmonary/Chest: Effort normal and breath sounds normal.  Abdominal: Soft. Bowel sounds are normal. There is no tenderness.  Musculoskeletal: Normal range of motion.  Neurological: She is alert and oriented to person, place, and time.  Skin: Skin is warm and dry.  Scaling, hyper and hypopigmented 7 cm to R shin with surrounding erythema  Vitals reviewed.   ED Course  Procedures (including critical care time) Labs Review Labs Reviewed  CBC WITH DIFFERENTIAL/PLATELET - Abnormal; Notable for the following:    WBC 2.3 (*)    RBC 3.08 (*)    Hemoglobin 8.1 (*)    HCT 25.6 (*)    RDW 16.4 (*)    Platelets 134 (*)    Neutro Abs 1.3 (*)    Lymphs Abs 0.5 (*)    Eosinophils Relative 11 (*)    All other components within normal limits  COMPREHENSIVE METABOLIC PANEL - Abnormal; Notable for the following:    Potassium 2.8 (*)    Chloride 112 (*)    Calcium 8.0 (*)    Total Protein 8.2 (*)    Albumin 3.0 (*)    Anion gap 4 (*)    All other components within normal limits    Imaging Review No results found.   EKG Interpretation None      MDM   Final diagnoses:  Cellulitis of right lower extremity  Kaposi sarcoma    34 y.o. 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  No fevers, systemic symptoms.  DC home with abx and instruction to fu with PCP ASAP.    I have reviewed all laboratory and imaging studies if ordered as above  1. Cellulitis of right lower extremity   2. Kaposi sarcoma         Debby Freiberg, MD 12/09/14 [phone]

## 2015-02-14 ENCOUNTER — Emergency Department (HOSPITAL_COMMUNITY)
Admission: EM | Admit: 2015-02-14 | Discharge: 2015-02-14 | Disposition: A | Discharge status: Home / Self Care | Payer: Medicaid Other | Attending: Emergency Medicine | Admitting: Emergency Medicine

## 2015-02-14 ENCOUNTER — Encounter (HOSPITAL_COMMUNITY): Payer: Self-pay | Admitting: *Deleted

## 2015-02-14 DIAGNOSIS — F419 Anxiety disorder, unspecified: Secondary | ICD-10-CM | POA: Diagnosis present

## 2015-02-14 DIAGNOSIS — Z21 Asymptomatic human immunodeficiency virus [HIV] infection status: Secondary | ICD-10-CM | POA: Diagnosis not present

## 2015-02-14 DIAGNOSIS — F410 Panic disorder [episodic paroxysmal anxiety] without agoraphobia: Secondary | ICD-10-CM | POA: Insufficient documentation

## 2015-02-14 DIAGNOSIS — F329 Major depressive disorder, single episode, unspecified: Secondary | ICD-10-CM | POA: Insufficient documentation

## 2015-02-14 DIAGNOSIS — Z720 Tobacco use: Secondary | ICD-10-CM | POA: Diagnosis not present

## 2015-02-14 DIAGNOSIS — Z79899 Other long term (current) drug therapy: Secondary | ICD-10-CM | POA: Insufficient documentation

## 2015-02-14 DIAGNOSIS — J45909 Unspecified asthma, uncomplicated: Secondary | ICD-10-CM | POA: Diagnosis not present

## 2015-02-14 DIAGNOSIS — Z872 Personal history of diseases of the skin and subcutaneous tissue: Secondary | ICD-10-CM | POA: Insufficient documentation

## 2015-02-14 NOTE — ED Provider Notes (Signed)
CSN: [id number]     Arrival date & time 02/14/15  1353 History   First MD Initiated Contact with Patient 02/14/15 1408     Chief Complaint  Patient presents with  . Anxiety  . Drowsy       HPI Patient reports she was having a panic attack and in route EMS gave her Versed as she was becoming more and more anxious.  2 mg IV Versed was given.  On arrival to emergency department the patient is somewhat somnolent but is easily arousable.  She continues to drift off during the initial history.  No other complaints.  No chest pain shortness of breath.  No homicidal or suicidal thoughts.  No other complaints.   Past Medical History  Diagnosis Date  . Anxiety   . Depression   . HIV infection   . Asthma   . Abscess    Past Surgical History  Procedure Laterality Date  . Cesarean section     Family History  Problem Relation Age of Onset  . Hypertension Other   . Cancer Other   . Diabetes Other   . Asthma Other    Social History  Substance Use Topics  . Smoking status: Current Every Day Smoker -- 0.30 packs/day for 3 years    Types: Cigarettes  . Smokeless tobacco: Never Used  . Alcohol Use: Yes     Comment: occasional   OB History    No data available     Review of Systems  All other systems reviewed and are negative.     Allergies  Bee venom and Morphine and related  Home Medications   Prior to Admission medications   Medication Sig Start Date End Date Taking? Authorizing Provider  azithromycin (ZITHROMAX) 600 MG tablet Take 2 tablets (1,200 mg total) by mouth once a week. Patient not taking: Reported on 12/09/2014 08/16/14   Velta Addison Mikhail, DO  cephALEXin (KEFLEX) 500 MG capsule Take 1 capsule (500 mg total) by mouth 4 (four) times daily. Patient not taking: Reported on 12/09/2014 08/16/14   Velta Addison Mikhail, DO  clindamycin (CLEOCIN) 300 MG capsule Take 1 capsule (300 mg total) by mouth 4 (four) times daily. X 7 days 12/09/14   Debby Freiberg, MD  Darunavir Ethanolate  (PREZISTA) 800 MG tablet Take 1 tablet (800 mg total) by mouth daily with breakfast. 08/16/14   Cristal Ford, DO  emtricitabine-tenofovir (TRUVADA) 200-300 MG per tablet Take 1 tablet by mouth daily. 08/16/14   Maryann Mikhail, DO  hydrocerin (EUCERIN) CREA Apply 1 application topically 2 (two) times daily. 08/16/14   Maryann Mikhail, DO  ibuprofen (ADVIL,MOTRIN) 200 MG tablet Take 400 mg by mouth every 6 (six) hours as needed (pain).     Historical Provider, MD  meloxicam (MOBIC) 7.5 MG tablet Take 1 tablet (7.5 mg total) by mouth daily as needed (moderate discomfort at affected breast). Patient not taking: Reported on 12/09/2014 08/16/14   Velta Addison Mikhail, DO  potassium chloride SA (K-DUR,KLOR-CON) 20 MEQ tablet Take 1 tablet (20 mEq total) by mouth daily. Patient not taking: Reported on 05/23/2014 03/02/14 03/09/14  Domenic Moras, PA-C  ritonavir (NORVIR) 100 MG TABS tablet Take 1 tablet (100 mg total) by mouth daily with breakfast. 08/16/14   Cristal Ford, DO  sulfamethoxazole-trimethoprim (BACTRIM DS,SEPTRA DS) 800-160 MG per tablet Take 1 tablet by mouth 3 (three) times a week. Patient not taking: Reported on 12/09/2014 08/16/14   Maryann Mikhail, DO   BP 102/64 mmHg  Pulse 81  Temp(Src) 97.5  F (36.4 C) (Oral)  Resp 21  SpO2 100% Physical Exam  Constitutional: She is oriented to person, place, and time. She appears well-developed and well-nourished. No distress.  HENT:  Head: Normocephalic and atraumatic.  Eyes: EOM are normal.  Neck: Normal range of motion.  Cardiovascular: Normal rate, regular rhythm and normal heart sounds.   Pulmonary/Chest: Effort normal and breath sounds normal.  Abdominal: Soft. She exhibits no distension. There is no tenderness.  Musculoskeletal: Normal range of motion.  Neurological: She is alert and oriented to person, place, and time.  Skin: Skin is warm and dry.  Psychiatric: She has a normal mood and affect. Judgment normal.  Nursing note and vitals  reviewed.   ED Course  Procedures (including critical care time) Labs Review Labs Reviewed - No data to display  Imaging Review No results found. I have personally reviewed and evaluated these images and lab results as part of my medical decision-making.   EKG Interpretation None      MDM   Final diagnoses:  None    Patient observed in the emergency department.  The effects of the Versed appear to be wearing off.  She is doing much better.  Primary care follow-up.    Jola Schmidt, MD 02/14/15 [phone]

## 2015-02-14 NOTE — ED Notes (Addendum)
Per ems pt was at her grandmothers house, in between places to live. Pt reports she felt like she was having a panic attack, which  She was. Panic attack going on 1 hour. Ems tried to have pt do slow deep breathing, pt noncomplaint. En route. Pt reports she has HIV, recently lost her home, and a relative was shot and killed last week. Hx of anxiety, does not take medication for. Pt was getting visibly upset. Alert and oriented x4.  Pt given versed medication. Pt drowsy now. Arousable.   20 g L AC, 2.5mg  versed given.   Upon rn assessment, pt alert and oriented x4, but must really rouse pt to get her to answer questions. Pt very drowsy, falling asleep in between questions.

## 2015-02-14 NOTE — ED Notes (Signed)
Before pt was discharged, pt asking if she could have note to miss work tomorrow so "she could sleep". rn explained pt could have a note saying she was seen and treated in ED today. Note provided. Pt crying during discharge.

## 2015-02-14 NOTE — ED Notes (Signed)
md at bedside  md assessed pts area on leg she was concerned about.

## 2015-02-14 NOTE — ED Notes (Signed)
Pt awake sitting up in bed, talking on cell phone. md made aware.

## 2015-02-14 NOTE — Discharge Instructions (Signed)
Panic Attacks °Panic attacks are sudden, short feelings of great fear or discomfort. You may have them for no reason when you are relaxed, when you are uneasy (anxious), or when you are sleeping.  °HOME CARE °· Take all your medicines as told. °· Check with your doctor before starting new medicines. °· Keep all doctor visits. °GET HELP IF: °· You are not able to take your medicines as told. °· Your symptoms do not get better. °· Your symptoms get worse. °GET HELP RIGHT AWAY IF: °· Your attacks seem different than your normal attacks. °· You have thoughts about hurting yourself or others. °· You take panic attack medicine and you have a side effect. °MAKE SURE YOU: °· Understand these instructions. °· Will watch your condition. °· Will get help right away if you are not doing well or get worse. °Document Released: 07/11/2010 Document Revised: 03/29/2013 Document Reviewed: 01/20/2013 °[employer]® Patient Information ©2015 [employer]. This information is not intended to replace advice given to you by your health care provider. Make sure you discuss any questions you have with your health care provider. ° °

## 2015-02-14 NOTE — ED Notes (Signed)
Pt still drowsy, occasionally opening eyes, mumbling words that are incomprehensible.

## 2015-02-14 NOTE — ED Notes (Signed)
Bed: WTR5 Expected date:  Expected time:  Means of arrival:  Comments: EMS anxiety

## 2015-03-05 ENCOUNTER — Other Ambulatory Visit: Payer: Self-pay | Admitting: *Deleted

## 2015-03-05 ENCOUNTER — Other Ambulatory Visit (HOSPITAL_COMMUNITY)
Admission: RE | Admit: 2015-03-05 | Discharge: 2015-03-05 | Disposition: A | Discharge status: Home / Self Care | Payer: Medicaid Other | Source: Ambulatory Visit | Attending: Infectious Disease | Admitting: Infectious Disease

## 2015-03-05 ENCOUNTER — Other Ambulatory Visit: Payer: Medicaid Other

## 2015-03-05 ENCOUNTER — Ambulatory Visit: Payer: Medicaid Other

## 2015-03-05 ENCOUNTER — Ambulatory Visit (INDEPENDENT_AMBULATORY_CARE_PROVIDER_SITE_OTHER): Payer: Medicaid Other | Admitting: *Deleted

## 2015-03-05 ENCOUNTER — Other Ambulatory Visit: Payer: Self-pay | Admitting: Internal Medicine

## 2015-03-05 DIAGNOSIS — Z113 Encounter for screening for infections with a predominantly sexual mode of transmission: Secondary | ICD-10-CM

## 2015-03-05 DIAGNOSIS — Z23 Encounter for immunization: Secondary | ICD-10-CM

## 2015-03-05 DIAGNOSIS — B20 Human immunodeficiency virus [HIV] disease: Secondary | ICD-10-CM

## 2015-03-05 DIAGNOSIS — Z79899 Other long term (current) drug therapy: Secondary | ICD-10-CM

## 2015-03-05 LAB — COMPLETE METABOLIC PANEL WITH GFR
ALBUMIN: 2.8 g/dL — AB (ref 3.6–5.1)
ALK PHOS: 106 U/L (ref 33–115)
ALT: 13 U/L (ref 6–29)
AST: 30 U/L (ref 10–30)
BUN: 12 mg/dL (ref 7–25)
CALCIUM: 8.2 mg/dL — AB (ref 8.6–10.2)
CO2: 24 mmol/L (ref 20–31)
Chloride: 107 mmol/L (ref 98–110)
Creat: 0.52 mg/dL (ref 0.50–1.10)
GFR, Est African American: >89 mL/min (ref >=60)
GLUCOSE: 65 mg/dL (ref 65–99)
Potassium: 3.7 mmol/L (ref 3.5–5.3)
Sodium: 137 mmol/L (ref 135–146)
Total Bilirubin: 0.4 mg/dL (ref 0.2–1.2)
Total Protein: 7.7 g/dL (ref 6.1–8.1)

## 2015-03-05 LAB — CBC WITH DIFFERENTIAL/PLATELET
BASOS PCT: 0 % (ref 0–1)
Basophils Absolute: 0 10*3/uL (ref 0.0–0.1)
Eosinophils Absolute: 0.4 10*3/uL (ref 0.0–0.7)
Eosinophils Relative: 16 % — ABNORMAL HIGH (ref 0–5)
HEMATOCRIT: 25.1 % — AB (ref 36.0–46.0)
HEMOGLOBIN: 7.7 g/dL — AB (ref 12.0–15.0)
LYMPHS PCT: 6 % — AB (ref 12–46)
Lymphs Abs: 0.2 10*3/uL — ABNORMAL LOW (ref 0.7–4.0)
MCH: 23.7 pg — AB (ref 26.0–34.0)
MCHC: 30.7 g/dL (ref 30.0–36.0)
MCV: 77.2 fL — ABNORMAL LOW (ref 78.0–100.0)
MONO ABS: 0.2 10*3/uL (ref 0.1–1.0)
MONOS PCT: 6 % (ref 3–12)
MPV: 10.4 fL (ref 8.6–12.4)
NEUTROS ABS: 1.9 10*3/uL (ref 1.7–7.7)
NEUTROS PCT: 72 % (ref 43–77)
Platelets: 220 10*3/uL (ref 150–400)
RBC: 3.25 MIL/uL — ABNORMAL LOW (ref 3.87–5.11)
RDW: 16.4 % — ABNORMAL HIGH (ref 11.5–15.5)
WBC: 2.7 10*3/uL — ABNORMAL LOW (ref 4.0–10.5)

## 2015-03-05 LAB — LIPID PANEL
CHOL/HDL RATIO: 2.8 ratio (ref <=5.0)
Cholesterol: 108 mg/dL — ABNORMAL LOW (ref 125–200)
HDL: 39 mg/dL — ABNORMAL LOW (ref >=46)
LDL Cholesterol: 49 mg/dL (ref <130)
Triglycerides: 101 mg/dL (ref <150)
VLDL: 20 mg/dL (ref <30)

## 2015-03-05 MED ORDER — HYDROCERIN EX CREA: 1.0000 "application " | TOPICAL_CREAM | Freq: Two times a day (BID) | CUTANEOUS | Status: DC

## 2015-03-05 MED ORDER — FLUOXETINE HCL 10 MG PO TABS: 10.0000 mg | ORAL_TABLET | Freq: Every day | ORAL | Status: DC

## 2015-03-05 NOTE — BH Specialist Note (Signed)
I met with Beverly [name] today with her sister present, offering her support and input.  She reports a long history of depression and anxiety.  She said she had a panic attack a week and a half ago, with crying, shaking, fast breathing, and nervousness.  She describes herself as a Research officer, trade [employer].  Poor sleep.  Poor appetite.  Lost 50 lbs in the past 6 months. She also has what sound like dissociative spells, sometimes resulting in "locking up" and not being able to move.  She denies a history of trauma, but mentioned her mother's death as very upsetting (5 years ago).  Her father has cancer and this concerns her.  She is currently living in the [place], with her 36 year old son, but is working part time as a Engineer, maintenance (IT).  Her sister was very supportive and said she has been trying to get her in for help for some time now.  She admits to some suicidal ideation with no plan.  She has not taken her HIV meds for some time now.  She has 4 kids, 2 are kept by her niece and one by his father.  She took Zoloft and xanax before, but said she did not like the Zoloft.  I agreed to talk to Dr. Linus Salmons (which I did) about calling in an antidepressant.  Plan to meet with her again in one week. Curley Spice, LCSW

## 2015-03-06 LAB — T-HELPER CELL (CD4) - (RCID CLINIC ONLY)
CD4 % Helper T Cell: 1 % — ABNORMAL LOW (ref 33–55)
CD4 T CELL ABS: 30 /uL — AB (ref 400–2700)

## 2015-03-06 LAB — HIV-1 RNA QUANT-NO REFLEX-BLD
HIV 1 RNA Quant: 380687 copies/mL — ABNORMAL HIGH (ref <20)
HIV-1 RNA QUANT, LOG: 5.58 {Log} — AB (ref <1.30)

## 2015-03-06 LAB — URINE CYTOLOGY ANCILLARY ONLY
Chlamydia: NEGATIVE
Neisseria Gonorrhea: NEGATIVE

## 2015-03-06 LAB — RPR

## 2015-03-12 ENCOUNTER — Ambulatory Visit: Payer: Medicaid Other

## 2015-03-13 ENCOUNTER — Ambulatory Visit: Payer: Medicaid Other

## 2015-03-13 DIAGNOSIS — F332 Major depressive disorder, recurrent severe without psychotic features: Secondary | ICD-10-CM

## 2015-03-13 NOTE — BH Specialist Note (Signed)
Beverly [name] came in for her second visit and reported that the Prozac 10 mg was causing problems for her - stomach upset, etc. - so she stopped taking it.  We completed a treatment plan with goals to reduce anxiety and panic symptoms and improve mood.  I taught her about mindfulness and she loaded a meditation app on her phone.  I also told her about melatonin for sleep.  Plan to meet again in one week. Curley Spice, LCSW

## 2015-03-19 ENCOUNTER — Ambulatory Visit (INDEPENDENT_AMBULATORY_CARE_PROVIDER_SITE_OTHER): Payer: Medicaid Other | Admitting: Infectious Disease

## 2015-03-19 ENCOUNTER — Ambulatory Visit: Payer: Medicaid Other

## 2015-03-19 ENCOUNTER — Encounter: Payer: Self-pay | Admitting: Infectious Disease

## 2015-03-19 VITALS — BP 126/84 | HR 84 | Wt 112.0 lb

## 2015-03-19 DIAGNOSIS — Z21 Asymptomatic human immunodeficiency virus [HIV] infection status: Secondary | ICD-10-CM

## 2015-03-19 DIAGNOSIS — Z5900 Homelessness unspecified: Secondary | ICD-10-CM

## 2015-03-19 DIAGNOSIS — L989 Disorder of the skin and subcutaneous tissue, unspecified: Secondary | ICD-10-CM | POA: Diagnosis not present

## 2015-03-19 DIAGNOSIS — G63 Polyneuropathy in diseases classified elsewhere: Secondary | ICD-10-CM

## 2015-03-19 DIAGNOSIS — F331 Major depressive disorder, recurrent, moderate: Secondary | ICD-10-CM | POA: Diagnosis not present

## 2015-03-19 DIAGNOSIS — B20 Human immunodeficiency virus [HIV] disease: Secondary | ICD-10-CM | POA: Insufficient documentation

## 2015-03-19 DIAGNOSIS — F332 Major depressive disorder, recurrent severe without psychotic features: Secondary | ICD-10-CM

## 2015-03-19 DIAGNOSIS — F339 Major depressive disorder, recurrent, unspecified: Secondary | ICD-10-CM

## 2015-03-19 HISTORY — DX: Homelessness unspecified: Z59.00

## 2015-03-19 HISTORY — DX: Major depressive disorder, recurrent, unspecified: F33.9

## 2015-03-19 HISTORY — DX: Human immunodeficiency virus (HIV) disease: B20

## 2015-03-19 HISTORY — DX: Disorder of the skin and subcutaneous tissue, unspecified: L98.9

## 2015-03-19 MED ORDER — SULFAMETHOXAZOLE-TRIMETHOPRIM 800-160 MG PO TABS: 1.0000 | ORAL_TABLET | Freq: Two times a day (BID) | ORAL | Status: DC

## 2015-03-19 MED ORDER — EMTRICITABINE-TENOFOVIR AF 200-25 MG PO TABS: 1.0000 | ORAL_TABLET | Freq: Every day | ORAL | Status: DC

## 2015-03-19 MED ORDER — DOLUTEGRAVIR SODIUM 50 MG PO TABS: 50.0000 mg | ORAL_TABLET | Freq: Every day | ORAL | Status: DC

## 2015-03-19 NOTE — Progress Notes (Signed)
Subjective:    Patient ID: Beverly [name], female    DOB: 1980-07-27, 34 y.o.   MRN: [id number]  HPI  34 year old with HIV/AIDS who has been largely noncompliant except for period in January of 2013 where she had an undetectable viral load. She was last seen by my partner Dr. Baxter Flattery in February when the patient was admitted with cellulitis of her breast. Her viral load most recently is above 300k and CD4 30.  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   She is making attempt to get back into care and has been meeting with Grayland Ormond (now third meeting today). During meeting with Grayland Ormond she showed him lesion on her leg that has been expanding and which is very painful --see below. She tells me that she was told this is Kaposis which it could be though I see no biopsy to prove this.   She states that leg is very painful and that she cannot relieve the pain with NSAIDS and that she even took one of her father's "roxicets" with little relief. She also complains about what sounds like HIV neuropaty with numbness in her feet bilaterally though worse on the right hand side. She has areas of abnormal skin besides the lesion on her leg that she believes are due to eczema.   Past Medical History  Diagnosis Date  . Anxiety   . Depression   . HIV infection   . Asthma   . Abscess   . AIDS 03/19/2015    Past Surgical History  Procedure Laterality Date  . Cesarean section      Family History  Problem Relation Age of Onset  . Hypertension Other   . Cancer Other   . Diabetes Other   . Asthma Other       Social History   Social History  . Marital Status: Single    Spouse  Name: N/A  . Number of Children: N/A  . Years of Education: N/A   Social History Main Topics  . Smoking status: Current Every Day Smoker -- 0.30 packs/day for 3 years    Types: Cigarettes  . Smokeless tobacco: Never Used  . Alcohol Use: 0.0 oz/week    0 Standard drinks or equivalent per week     Comment: occasional  . Drug Use: 5.00 per week    Special: Marijuana  . Sexual Activity: No     Comment: pt. given condoms   Other Topics Concern  . None   Social History Narrative    Allergies  Allergen Reactions  . Bee Venom Anaphylaxis, Shortness Of Breath and Swelling  . Morphine And Related Hives and Itching    Just can't take "morphine", vicodin is OK     Current outpatient prescriptions:  .  ibuprofen (ADVIL,MOTRIN) 200 MG tablet, Take 400 mg by mouth every 6 (six) hours as needed (pain). , Disp: , Rfl:  .  dolutegravir (TIVICAY) 50 MG tablet, Take 1 tablet (50 mg total) by mouth daily., Disp: 30 tablet, Rfl: 11 .  emtricitabine-tenofovir AF (DESCOVY) 200-25 MG tablet, Take 1 tablet by mouth daily., Disp: 30 tablet, Rfl: 11 .  FLUoxetine (PROZAC) 10 MG tablet, Take 1 tablet (10 mg total) by mouth daily. (Patient not taking: Reported on 03/19/2015), Disp: 30 tablet, Rfl: 3 .  hydrocerin (EUCERIN) CREA, Apply 1 application topically 2 (two) times daily. (Patient not taking: Reported on 03/19/2015), Disp: 454 g, Rfl: 0 .  meloxicam (MOBIC) 7.5 MG tablet, Take 1 tablet (7.5 mg total) by mouth daily as needed (moderate discomfort at affected breast). (Patient not taking: Reported on 12/09/2014), Disp: 10 tablet, Rfl: 0 .  potassium chloride SA (K-DUR,KLOR-CON) 20 MEQ tablet, Take 1 tablet (20 mEq total) by mouth daily. (Patient not taking: Reported on 05/23/2014), Disp: 7 tablet, Rfl: 0 .  sulfamethoxazole-trimethoprim (BACTRIM DS,SEPTRA DS) 800-160 MG tablet, Take 1 tablet by mouth 2 (two) times daily., Disp: 60 tablet, Rfl: 1    Review of Systems  Constitutional: Positive for  appetite change. Negative for fever, chills, diaphoresis, activity change, fatigue and unexpected weight change.  HENT: Negative for congestion, rhinorrhea, sinus pressure, sneezing, sore throat and trouble swallowing.   Eyes: Negative for photophobia and visual disturbance.  Respiratory: Negative for cough, chest tightness, shortness of breath, wheezing and stridor.   Cardiovascular: Negative for chest pain, palpitations and leg swelling.  Gastrointestinal: Positive for nausea. Negative for vomiting, abdominal pain, diarrhea, constipation, blood in stool, abdominal distention and anal bleeding.  Genitourinary: Negative for dysuria, hematuria, flank pain and difficulty urinating.  Musculoskeletal: Negative for myalgias, back pain, joint swelling, arthralgias and gait problem.  Skin: Positive for color change, rash and wound. Negative for pallor.  Neurological: Negative for dizziness, tremors, weakness and light-headedness.  Hematological: Negative for adenopathy. Does not bruise/bleed easily.  Psychiatric/Behavioral: Positive for sleep disturbance and dysphoric mood. Negative for suicidal ideas, behavioral problems, confusion, self-injury, decreased concentration and agitation. The patient is nervous/anxious.        Objective:   Physical Exam  Constitutional: She is oriented to person, place, and time. No distress.  HENT:  Head: Normocephalic and atraumatic.  Mouth/Throat: No oropharyngeal exudate.  Eyes: Conjunctivae and EOM are normal. No scleral icterus.  Neck: Normal range of motion. Neck supple.  Cardiovascular: Normal rate, regular rhythm and normal heart sounds.  Exam reveals no gallop and no friction rub.   No murmur heard. Pulmonary/Chest: Effort normal. No respiratory distress. She has no wheezes.  Abdominal: She exhibits no distension.  Musculoskeletal: She exhibits no edema.  Neurological: She is alert and oriented to person, place, and time. She exhibits normal muscle tone.  Coordination normal.  Skin: Skin is warm and dry. Lesion noted. No rash noted. She is not diaphoretic. There is erythema. No pallor.  Psychiatric: Her behavior is normal. Judgment and thought content normal. Her mood appears anxious. She exhibits a depressed mood.  tearful    03/19/15: Large ulcerated lesion on the right leg with hyperpigmented borders but clearing in the middle. She has other hyperpigmented areas laterally      Left leg with areas of hyperpigmentation and excoriation     Abdomen with thickened skin and excoration           Assessment & Plan:    Skin lesions: not clear what they are. Possible could indeed by KS vs another malignancy vs infection including nocardia and bartonella  I will start her on bactrim DS po BID and consider punch biopsy when she comes back next Monday   HIV/AIDS: will start Tivicay and Descovy to achieve rapid virological control I am starting bactrim targettng her legs but this will also serve as  PCP prophylaxis  Depression, grieving: seeing Grayland Ormond. 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 I informed her that we will not be rx narcotics fromt his clinic  I spent greater than 40 minutes with the patient including greater than 50% of time in face to face counsel of the patient re her HIV, AIDS, her skin lesions, her depression, her pain, her HIV neuropathy and her grieving and in coordination of their care.

## 2015-03-19 NOTE — BH Specialist Note (Signed)
Beverly [name] limped to my office today, reporting that her leg was bothering her and keeping her awake at night.  She pulled up her pants leg and showed a sore on her leg.  I arranged for her to see the doctor after our session and she did.  She talked about her anxiety due to only working 10 hours a week and the [employer] trying to move her to an apartment by the end of the week.  She worries that she can't find enough work, but has put in applications.  Plan to meet again next week. Curley Spice, LCSW

## 2015-03-20 MED ORDER — KETOROLAC TROMETHAMINE 30 MG/ML IJ SOLN
30.0000 mg | Freq: Once | INTRAMUSCULAR | Status: AC
  Administered 2015-03-19: 30 mg via INTRAVENOUS

## 2015-03-20 NOTE — Addendum Note (Signed)
Addended by: Landis Gandy on: 03/20/2015 09:08 AM   Modules accepted: Orders

## 2015-03-25 ENCOUNTER — Encounter: Payer: Self-pay | Admitting: Infectious Disease

## 2015-03-25 ENCOUNTER — Ambulatory Visit (INDEPENDENT_AMBULATORY_CARE_PROVIDER_SITE_OTHER): Payer: Medicaid Other | Admitting: Infectious Disease

## 2015-03-25 ENCOUNTER — Other Ambulatory Visit (HOSPITAL_COMMUNITY)
Admission: RE | Admit: 2015-03-25 | Discharge: 2015-03-25 | Disposition: A | Discharge status: Home / Self Care | Payer: Medicaid Other | Source: Ambulatory Visit | Attending: Infectious Disease | Admitting: Infectious Disease

## 2015-03-25 VITALS — BP 120/83 | HR 122 | Temp 98.7°F | Ht 61.0 in | Wt 111.0 lb

## 2015-03-25 DIAGNOSIS — L989 Disorder of the skin and subcutaneous tissue, unspecified: Secondary | ICD-10-CM | POA: Insufficient documentation

## 2015-03-25 DIAGNOSIS — B20 Human immunodeficiency virus [HIV] disease: Secondary | ICD-10-CM | POA: Diagnosis not present

## 2015-03-25 MED ORDER — DRONABINOL 5 MG PO CAPS: 5.0000 mg | ORAL_CAPSULE | Freq: Two times a day (BID) | ORAL | Status: DC

## 2015-03-25 NOTE — Progress Notes (Signed)
Chief complaint: painful leg ulcers  Subjective:    Patient ID: Beverly [name], female    DOB: [birth year], 34 y.o.   MRN: [id number]  HPI   34 year old with HIV/AIDS who has been largely noncompliant except for period in January of 2013 where she had an undetectable viral load. She was last seen by my partner Dr. Baxter Flattery in February when the patient was admitted with cellulitis of her breast. Her viral load most recently is above 300k and CD4 30.  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   She has been making  attempt to get back into care and has been meeting with Grayland Ormond (now third meeting today). During meeting with Grayland Ormond she showed him lesion on her leg that has been expanding and which is very painful --see below. She tells me that she was told this is Kaposis which it could be though I see no biopsy to prove this.   She stated that leg is very painful and that she cannot relieve the pain with NSAIDS and that she even took one of her father's "roxicets" with little relief. She also complains about what sounds like HIV neuropaty with numbness in her feet bilaterally though worse on the right hand side. She has areas of abnormal skin besides the lesion on her leg that she believes are due to eczema. I examined her and started her on Bactrim DS bid and asked her to followup this week. I also rx for Tivicay and Descovy but her pharmacy only had the former and not the latter and she has been taking TIvicay alone. I have instructed her to get the Descovy asap and take BOTH together not the Tivicay by itself.   Past Medical History  Diagnosis Date  .  Anxiety   . Depression   . HIV infection   . Asthma   . Abscess   . AIDS 03/19/2015  . Leg lesion 03/19/2015  . 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 Homeless 03/19/2015  . 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 Cesarean section      Family History  Problem Relation Age of Onset  . Hypertension Other   . Cancer Other   . Diabetes Other   . Asthma Other   . Throat cancer Mother   . Throat cancer Father       Social History   Social History  . Marital Status: Single    Spouse Name: N/A  . Number of Children: N/A  . Years of Education: N/A   Social History Main Topics  . Smoking status: Current Every Day Smoker -- 0.30 packs/day for 3 years    Types: Cigarettes  . Smokeless tobacco: Never Used  . Alcohol Use: 0.0 oz/week    0 Standard drinks or equivalent per week     Comment: occasional  . Drug Use: 5.00 per week    Special: Marijuana  . Sexual Activity: No     Comment: pt. given condoms   Other Topics Concern  . Not on file  Social History Narrative    Allergies  Allergen Reactions  . Bee Venom Anaphylaxis, Shortness Of Breath and Swelling  . Morphine And Related Hives and Itching    Just can't take "morphine", vicodin is OK     Current outpatient prescriptions:  .  acetaminophen (TYLENOL) 650 MG CR tablet, Take 650 mg by mouth every 8 (eight) hours as needed for pain., Disp: , Rfl:  .  ibuprofen (ADVIL,MOTRIN) 200 MG tablet, Take 400 mg by mouth every 6 (six) hours as needed (pain). , Disp: , Rfl:  .  dolutegravir (TIVICAY) 50 MG tablet, Take 1 tablet (50 mg total) by mouth daily., Disp: 30 tablet, Rfl: 11 .  emtricitabine-tenofovir AF (DESCOVY) 200-25 MG tablet, Take 1 tablet by mouth daily., Disp: 30 tablet, Rfl: 11 .  hydrocerin (EUCERIN) CREA, Apply 1 application topically 2 (two) times daily. (Patient not taking: Reported on 03/19/2015), Disp: 454 g, Rfl: 0 .  meloxicam (MOBIC) 7.5 MG tablet, Take  1 tablet (7.5 mg total) by mouth daily as needed (moderate discomfort at affected breast). (Patient not taking: Reported on 12/09/2014), Disp: 10 tablet, Rfl: 0 .  sulfamethoxazole-trimethoprim (BACTRIM DS,SEPTRA DS) 800-160 MG tablet, Take 1 tablet by mouth 2 (two) times daily., Disp: 60 tablet, Rfl: 1    Review of Systems  Constitutional: Positive for appetite change. Negative for fever, chills, diaphoresis, activity change, fatigue and unexpected weight change.  HENT: Negative for congestion, rhinorrhea, sinus pressure, sneezing, sore throat and trouble swallowing.   Eyes: Negative for photophobia and visual disturbance.  Respiratory: Negative for cough, chest tightness, shortness of breath, wheezing and stridor.   Cardiovascular: Negative for chest pain, palpitations and leg swelling.  Gastrointestinal: Positive for nausea. Negative for vomiting, abdominal pain, diarrhea, constipation, blood in stool, abdominal distention and anal bleeding.  Genitourinary: Negative for dysuria, hematuria, flank pain and difficulty urinating.  Musculoskeletal: Negative for myalgias, back pain, joint swelling, arthralgias and gait problem.  Skin: Positive for color change, rash and wound. Negative for pallor.  Neurological: Negative for dizziness, tremors, weakness and light-headedness.  Hematological: Negative for adenopathy. Does not bruise/bleed easily.  Psychiatric/Behavioral: Positive for sleep disturbance and dysphoric mood. Negative for suicidal ideas, behavioral problems, confusion, self-injury, decreased concentration and agitation. The patient is nervous/anxious.        Objective:   Physical Exam  Constitutional: She is oriented to person, place, and time. No distress.  HENT:  Head: Normocephalic and atraumatic.  Mouth/Throat: No oropharyngeal exudate.  Eyes: Conjunctivae and EOM are normal. No scleral icterus.  Neck: Normal range of motion. Neck supple.  Cardiovascular: Normal rate, regular  rhythm and normal heart sounds.  Exam reveals no gallop and no friction rub.   No murmur heard. Pulmonary/Chest: Effort normal. No respiratory distress. She has no wheezes.  Abdominal: She exhibits no distension.  Musculoskeletal: She exhibits no edema.  Neurological: She is alert and oriented to person, place, and time. She exhibits normal muscle tone. Coordination normal.  Skin: Skin is warm and dry. Lesion noted. No rash noted. She is not diaphoretic. There is erythema. No pallor.  Psychiatric: Her behavior is normal. Judgment and thought content normal. Her mood appears anxious. She exhibits a depressed mood.  tearful    03/19/15: Large ulcerated lesion on the right leg with hyperpigmented borders but clearing in the middle. She has other hyperpigmented areas laterally    03/25/15: Leg is perhaps better today at least less areas of hypopigmentation and perhaps smaller       03/19/15:  Left leg with areas of hyperpigmentation and excoriation     Abdomen with thickened skin and excoration             Assessment & Plan:    Skin lesions: not clear what they are. Possible could indeed by KS vs another malignancy vs infection including nocardia and bartonella  I  Had started heron bactrim DS po BID and today performed punch biopsy  PROCEDURE: PUNCH BIOPSY PERMIT: INFORMED CONSENT OBTAINED AFTER RISKS AND BENEFITS EXPLAINED AND SIGNED IN CHART  DESCRIPTION; AREA PREPPED IN USUAL STERILE MANNER WITH BETADINE  TWO AREAS INFILTRATED WITH FOUR FOUR INJECTIONS OF LIDOCAINE  PUNCH BIOPSY OBTAINED FROM TWO SITES WITH ONE SENT TO PATH AND THE OTHER TO [facility]  EBL: MINIMAL  COMPLICATIONS: NONE  HIV/AIDS: she is to obtain the Descovy ASAP and take this with Tivicay to achieve rapid virological control I am starting bactrim targettng her legs but this will also serve as PCP prophylaxis  Depression, grieving: seeing Grayland Ormond. 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  Alcide Evener, MD

## 2015-03-26 ENCOUNTER — Ambulatory Visit: Payer: Medicaid Other

## 2015-04-01 ENCOUNTER — Ambulatory Visit: Payer: Medicaid Other

## 2015-04-01 DIAGNOSIS — F331 Major depressive disorder, recurrent, moderate: Secondary | ICD-10-CM

## 2015-04-01 NOTE — BH Specialist Note (Signed)
Beverly [name] came in today, walking without a limp and reporting that her leg is much better.  She says she is also taking her HIV medication regularly.  She continues to struggle with her situation, living in the shelter with her son and worrying about her other 2 children, who are staying with her 32 year old niece.  I gave her some psycho-education on how our thoughts affect our moods.  Plan to meet again in one week. Curley Spice, LCSW

## 2015-04-04 ENCOUNTER — Other Ambulatory Visit: Payer: Self-pay | Admitting: Infectious Disease

## 2015-04-04 MED ORDER — MEGESTROL ACETATE 625 MG/5ML PO SUSP: 625.0000 mg | Freq: Every day | ORAL | Status: DC

## 2015-04-09 ENCOUNTER — Telehealth: Payer: Self-pay | Admitting: *Deleted

## 2015-04-09 NOTE — Telephone Encounter (Signed)
Both are needing prior auth. I will work on the [place]. Beverly [name]

## 2015-04-09 NOTE — Telephone Encounter (Signed)
I wanted to go with Marinol but I was the impression that they refused that?

## 2015-04-09 NOTE — Telephone Encounter (Signed)
We received two prior authorizations for patient. One for Megestrol and the other for Marinol. Should patient be taking both of these meds? If not, which are you prescribing? Beverly [name]

## 2015-04-10 ENCOUNTER — Ambulatory Visit: Payer: Medicaid Other

## 2015-04-10 NOTE — Telephone Encounter (Signed)
Perfect thanks Jackie 

## 2015-04-17 ENCOUNTER — Ambulatory Visit: Payer: Medicaid Other

## 2015-04-21 LAB — FUNGUS CULTURE W SMEAR: Smear Result: NONE SEEN

## 2015-04-22 ENCOUNTER — Ambulatory Visit: Payer: Medicaid Other

## 2015-05-02 ENCOUNTER — Emergency Department (HOSPITAL_COMMUNITY)
Admission: EM | Admit: 2015-05-02 | Discharge: 2015-05-02 | Disposition: A | Discharge status: Home / Self Care | Payer: Medicaid Other | Attending: Emergency Medicine | Admitting: Emergency Medicine

## 2015-05-02 ENCOUNTER — Emergency Department (HOSPITAL_COMMUNITY): Payer: Medicaid Other

## 2015-05-02 ENCOUNTER — Encounter (HOSPITAL_COMMUNITY): Payer: Self-pay

## 2015-05-02 DIAGNOSIS — L819 Disorder of pigmentation, unspecified: Secondary | ICD-10-CM | POA: Insufficient documentation

## 2015-05-02 DIAGNOSIS — F419 Anxiety disorder, unspecified: Secondary | ICD-10-CM | POA: Diagnosis not present

## 2015-05-02 DIAGNOSIS — D649 Anemia, unspecified: Secondary | ICD-10-CM | POA: Diagnosis not present

## 2015-05-02 DIAGNOSIS — R059 Cough, unspecified: Secondary | ICD-10-CM

## 2015-05-02 DIAGNOSIS — Z8669 Personal history of other diseases of the nervous system and sense organs: Secondary | ICD-10-CM | POA: Diagnosis not present

## 2015-05-02 DIAGNOSIS — M542 Cervicalgia: Secondary | ICD-10-CM | POA: Insufficient documentation

## 2015-05-02 DIAGNOSIS — B20 Human immunodeficiency virus [HIV] disease: Secondary | ICD-10-CM | POA: Diagnosis not present

## 2015-05-02 DIAGNOSIS — R51 Headache: Secondary | ICD-10-CM | POA: Insufficient documentation

## 2015-05-02 DIAGNOSIS — R05 Cough: Secondary | ICD-10-CM

## 2015-05-02 DIAGNOSIS — Z79899 Other long term (current) drug therapy: Secondary | ICD-10-CM | POA: Insufficient documentation

## 2015-05-02 DIAGNOSIS — Z720 Tobacco use: Secondary | ICD-10-CM | POA: Diagnosis not present

## 2015-05-02 DIAGNOSIS — R6883 Chills (without fever): Secondary | ICD-10-CM | POA: Diagnosis not present

## 2015-05-02 DIAGNOSIS — Z792 Long term (current) use of antibiotics: Secondary | ICD-10-CM | POA: Diagnosis not present

## 2015-05-02 DIAGNOSIS — J45909 Unspecified asthma, uncomplicated: Secondary | ICD-10-CM | POA: Diagnosis not present

## 2015-05-02 DIAGNOSIS — R079 Chest pain, unspecified: Secondary | ICD-10-CM | POA: Insufficient documentation

## 2015-05-02 LAB — BASIC METABOLIC PANEL
Anion gap: 4 — ABNORMAL LOW (ref 5–15)
BUN: 22 mg/dL — AB (ref 6–20)
CALCIUM: 8.4 mg/dL — AB (ref 8.9–10.3)
CO2: 26 mmol/L (ref 22–32)
Chloride: 105 mmol/L (ref 101–111)
Creatinine, Ser: 0.79 mg/dL (ref 0.44–1.00)
GFR calc Af Amer: >60 mL/min (ref >60)
Glucose, Bld: 96 mg/dL (ref 65–99)
Potassium: 3.8 mmol/L (ref 3.5–5.1)
Sodium: 135 mmol/L (ref 135–145)

## 2015-05-02 LAB — CBC WITH DIFFERENTIAL/PLATELET
BASOS ABS: 0 10*3/uL (ref 0.0–0.1)
BASOS PCT: 0 %
EOS ABS: 0.6 10*3/uL (ref 0.0–0.7)
EOS PCT: 14 %
HCT: 26.3 % — ABNORMAL LOW (ref 36.0–46.0)
Hemoglobin: 7.9 g/dL — ABNORMAL LOW (ref 12.0–15.0)
Lymphocytes Relative: 34 %
Lymphs Abs: 1.4 10*3/uL (ref 0.7–4.0)
MCH: 23.8 pg — ABNORMAL LOW (ref 26.0–34.0)
MCHC: 30 g/dL (ref 30.0–36.0)
MCV: 79.2 fL (ref 78.0–100.0)
MONO ABS: 0.3 10*3/uL (ref 0.1–1.0)
Monocytes Relative: 7 %
Neutro Abs: 1.8 10*3/uL (ref 1.7–7.7)
Neutrophils Relative %: 45 %
PLATELETS: 333 10*3/uL (ref 150–400)
RBC: 3.32 MIL/uL — ABNORMAL LOW (ref 3.87–5.11)
RDW: 17.4 % — AB (ref 11.5–15.5)
WBC: 4.1 10*3/uL (ref 4.0–10.5)

## 2015-05-02 NOTE — ED Notes (Signed)
Bed: WA07 Expected date:  Expected time:  Means of arrival:  Comments: EMS 34 yo female from [place]

## 2015-05-02 NOTE — ED Notes (Addendum)
Patient transported by [facility] for anxiety and neck pain x 1day.  Patient denies injury and has full movement.  Patient is requesting referral to mental health for treatment of anxiety. Patient reports recent history of family problems and is currently living at the [facility].

## 2015-05-02 NOTE — ED Provider Notes (Signed)
CSN: [id number]     Arrival date & time 05/02/15  0039 History   First MD Initiated Contact with Patient 05/02/15 0104     Chief Complaint  Patient presents with  . Anxiety     (Consider location/radiation/quality/duration/timing/severity/associated sxs/prior Treatment) Patient is a 34 y.o. female presenting with anxiety. The history is provided by the patient. No language interpreter was used.  Anxiety This is a recurrent problem. Associated symptoms include chills, coughing, headaches, myalgias and neck pain. Pertinent negatives include no abdominal pain, congestion, fever, nausea or vomiting. Associated symptoms comments: Patient has multiple symptoms. She feels depressed, under a great deal of stress with health, homelessness and family issues. Tonight she woke up in a cold sweat, heart racing, chest tight and felt she needed inpatient treatment for symptoms. She reports headaches, bilateral neck pain radiating into the occipital area. She denies SI/HI, hallucinations. She has a cough but denies SOB or wheezing with her history of asthma. No vomiting. She also reports worry of recent biopsy done to her right lower leg in evaluation of possible Kaposi's sarcoma. Results are pending. She reports her viral load counts have been elevated but she is now back on her medications for HIV.Marland Kitchen    Past Medical History  Diagnosis Date  . Anxiety   . Depression   . HIV infection ([place])   . Asthma   . Abscess   . AIDS ([place]) 03/19/2015  . Leg lesion 03/19/2015  . Major depression, recurrent ([place]) 03/19/2015  . Homeless 03/19/2015  . 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) 03/19/2015   Past Surgical History  Procedure Laterality Date  . Cesarean section     Family History  Problem Relation Age of Onset  . Hypertension Other   . Cancer Other   . Diabetes Other   . Asthma Other   . Throat cancer Mother   . Throat cancer Father    Social History  Substance Use Topics  . Smoking status: Current Every Day  Smoker -- 0.30 packs/day for 3 years    Types: Cigarettes  . Smokeless tobacco: Never Used  . Alcohol Use: 0.0 oz/week    0 Standard drinks or equivalent per week     Comment: occasional   OB History    No data available     Review of Systems  Constitutional: Positive for chills. Negative for fever.  HENT: Negative for congestion.   Respiratory: Positive for cough and chest tightness. Negative for shortness of breath.   Cardiovascular: Negative.   Gastrointestinal: Negative.  Negative for nausea, vomiting and abdominal pain.  Musculoskeletal: Positive for myalgias and neck pain.       Extremity pain - see HPI.  Skin: Positive for wound.       See HPI.  Neurological: Positive for headaches.      Allergies  Bee venom and Morphine and related  Home Medications   Prior to Admission medications   Medication Sig Start Date End Date Taking? Authorizing Provider  acetaminophen (TYLENOL) 650 MG CR tablet Take 650 mg by mouth every 8 (eight) hours as needed for pain.    Historical Provider, MD  dolutegravir (TIVICAY) 50 MG tablet Take 1 tablet (50 mg total) by mouth daily. 03/19/15   Truman Hayward, MD  dronabinol (MARINOL) 5 MG capsule Take 1 capsule (5 mg total) by mouth 2 (two) times daily before a meal. 03/25/15   Truman Hayward, MD  emtricitabine-tenofovir AF (DESCOVY) 200-25 MG tablet Take 1 tablet by  mouth daily. 03/19/15   Truman Hayward, MD  hydrocerin (EUCERIN) CREA Apply 1 application topically 2 (two) times daily. 03/05/15   Michel Bickers, MD  ibuprofen (ADVIL,MOTRIN) 200 MG tablet Take 400 mg by mouth every 6 (six) hours as needed (pain).     Historical Provider, MD  megestrol (MEGACE ES) 625 MG/5ML suspension Take 5 mLs (625 mg total) by mouth daily. 04/04/15   Truman Hayward, MD  meloxicam (MOBIC) 7.5 MG tablet Take 1 tablet (7.5 mg total) by mouth daily as needed (moderate discomfort at affected breast). 08/16/14   Maryann Mikhail, DO   sulfamethoxazole-trimethoprim (BACTRIM DS,SEPTRA DS) 800-160 MG tablet Take 1 tablet by mouth 2 (two) times daily. 03/19/15   Truman Hayward, MD   BP 115/75 mmHg  Pulse 99  Temp(Src) 98.1 F (36.7 C) (Oral)  Resp 20  SpO2 100%  LMP 04/23/2015 Physical Exam  Constitutional: She appears well-developed and well-nourished.  HENT:  Head: Normocephalic.  Neck: Normal range of motion. Neck supple.  Cardiovascular: Normal rate and regular rhythm.   Pulmonary/Chest: Effort normal and breath sounds normal.  Abdominal: Soft. Bowel sounds are normal. There is no tenderness. There is no rebound and no guarding.  Musculoskeletal: Normal range of motion.  Neurological: She is alert. No cranial nerve deficit.  Skin: Skin is warm and dry. No rash noted.  Large area to right LE of mixed hypo- and hyperpigmentation, scaling central areas, dry. No swelling, blisters, pustules or drainage.  Psychiatric: She has a normal mood and affect.    ED Course  Procedures (including critical care time) Labs Review Labs Reviewed - No data to display  Imaging Review No results found. I have personally reviewed and evaluated these images and lab results as part of my medical decision-making.   EKG Interpretation None      MDM   Final diagnoses:  None    1. Cough 2. Anxiety 3. Anemia  The patient's chart is reviewed. Hemoglobin of 7.9 is at baseline for pain. Currently no tachycardia, hypotension or symptoms of anemia. No further management needed at this time.  The patient came to the ED for symptoms of anxiety, which have now resolved. She, at no time, had SI/HI. Discussed that anxiety was not something treated as inpatient and resources would be provided for outpatient management. The patient agreed and acknowledged understanding.   No evidence of pneumonia or other acute condition. She is felt stable for discharge.      Charlann Lange, PA-C 05/02/15 [place], DO 05/02/15  [phone]

## 2015-05-02 NOTE — Discharge Instructions (Signed)
Anemia, Nonspecific Anemia is a condition in which the concentration of red blood cells or hemoglobin in the blood is below normal. Hemoglobin is a substance in red blood cells that carries oxygen to the tissues of the body. Anemia results in not enough oxygen reaching these tissues.  CAUSES  Common causes of anemia include:   Excessive bleeding. Bleeding may be internal or external. This includes excessive bleeding from periods (in women) or from the intestine.   Poor nutrition.   Chronic kidney, thyroid, and liver disease.  Bone marrow disorders that decrease red blood cell production.  Cancer and treatments for cancer.  HIV, AIDS, and their treatments.  Spleen problems that increase red blood cell destruction.  Blood disorders.  Excess destruction of red blood cells due to infection, medicines, and autoimmune disorders. SIGNS AND SYMPTOMS   Minor weakness.   Dizziness.   Headache.  Palpitations.   Shortness of breath, especially with exercise.   Paleness.  Cold sensitivity.  Indigestion.  Nausea.  Difficulty sleeping.  Difficulty concentrating. Symptoms may occur suddenly or they may develop slowly.  DIAGNOSIS  Additional blood tests are often needed. These help your health care provider determine the best treatment. Your health care provider will check your stool for blood and look for other causes of blood loss.  TREATMENT  Treatment varies depending on the cause of the anemia. Treatment can include:   Supplements of iron, vitamin 123456, or folic acid.   Hormone medicines.   A blood transfusion. This may be needed if blood loss is severe.   Hospitalization. This may be needed if there is significant continual blood loss.   Dietary changes.  Spleen removal. HOME CARE INSTRUCTIONS Keep all follow-up appointments. It often takes many weeks to correct anemia, and having your health care provider check on your condition and your response to  treatment is very important. SEEK IMMEDIATE MEDICAL CARE IF:   You develop extreme weakness, shortness of breath, or chest pain.   You become dizzy or have trouble concentrating.  You develop heavy vaginal bleeding.   You develop a rash.   You have bloody or black, tarry stools.   You faint.   You vomit up blood.   You vomit repeatedly.   You have abdominal pain.  You have a fever or persistent symptoms for more than 2-3 days.   You have a fever and your symptoms suddenly get worse.   You are dehydrated.  MAKE SURE YOU:  Understand these instructions.  Will watch your condition.  Will get help right away if you are not doing well or get worse.   This information is not intended to replace advice given to you by your health care provider. Make sure you discuss any questions you have with your health care provider.   Document Released: 07/16/2004 Document Revised: 02/08/2013 Document Reviewed: 12/02/2012 Elsevier Interactive Patient Education [employer].  Emergency Department Resource Guide 1) Find a Doctor and Pay Out of Pocket Although you won't have to find out who is covered by your insurance plan, it is a good idea to ask around and get recommendations. You will then need to call the office and see if the doctor you have chosen will accept you as a new patient and what types of options they offer for patients who are self-pay. Some doctors offer discounts or will set up payment plans for their patients who do not have insurance, but you will need to ask so you aren't surprised when you  get to your appointment.  2) Contact Your Local Health Department Not all health departments have doctors that can see patients for sick visits, but many do, so it is worth a call to see if yours does. If you don't know where your local health department is, you can check in your phone book. The CDC also has a tool to help you locate your state's health department, and  many state websites also have listings of all of their local health departments.  3) Find a [facility] If your illness is not likely to be very severe or complicated, you may want to try a walk in clinic. These are popping up all over the country in pharmacies, drugstores, and shopping centers. They're usually staffed by nurse practitioners or physician assistants that have been trained to treat common illnesses and complaints. They're usually fairly quick and inexpensive. However, if you have serious medical issues or chronic medical problems, these are probably not your best option.  No Primary Care Doctor: - Call Health Connect at  [phone] - they can help you locate a primary care doctor that  accepts your insurance, provides certain services, etc. - Physician Referral Service- [phone]  Chronic Pain Problems: Organization         Address  Phone   Notes  [facility]  [phone] Patients need to be referred by their primary care doctor.   Medication Assistance: Organization         Address  Phone   Notes  [facility] Medication [facility] [address] [place], [place] [address] [phone] --Must be a resident of [facility] -- Must have NO insurance coverage whatsoever (no Medicaid/ Medicare, etc.) -- The pt. MUST have a primary care doctor that directs their care regularly and follows them in the community   MedAssist  [phone]   [employer]  [phone]    Agencies that provide inexpensive medical care: Organization         Address  Phone   Notes  [place]  [phone]   [facility]    [phone]   [facility], [place] [address] [phone]   [place] [address], [place] [phone]   Planned Parenthood    [phone]   [facility]    [phone]   [place]  and [place] [address], [place] Phone:  [phone], Fax:  [phone] Hours of Operation:  9 am - 6 pm, M-F.  Also accepts Medicaid/Medicare and self-pay.  [facility] for [place], [address], [place] Phone: [phone], Fax: [phone]. Hours of Operation:  8:30 am - 5:30 pm, M-F.  Also accepts Medicaid and self-pay.  [facility] [place] [address], [place] Phone: [phone]   [place], [place], [place] [phone], Ext. [phone] Mondays & Thursdays: 7-9 AM.  First 15 patients are seen on a first come, first serve basis.    [place] Providers:  Organization         Address  Phone   Notes  [facility] [address], [place] [phone] Also accepts self-pay patients.  [place], [place]  [phone]   [facility]  [address], [place] [phone]   [place] [address], [place] [phone]   Lucianne Lei [address], [place]   [phone] Only accepts [place] Access [place] patients after they have their name applied to their card.   Self-Pay (no insurance) in [facility]:  Organization         Address  Phone   Notes  Sickle Cell Patients, [facility] Internal [facility] [place] [phone]   [facility]gent [facility] [place] [phone]   [facility] [place]  [place], [place], [place] [phone]   [facility]/Dr. Osei-Bonsu  [address], [place] or [address], [place] [phone] Phone number for both [place] and [place] locations is the same.  Urgent [facility] and [facility] [address] [place] [phone]   [place], [place] or [address] [phone]   [facility] [address] [place] [phone], phone; [phone], fax Sees patients 1st and 3rd Saturday of every month.  Must not qualify for public or private insurance (i.e. Medicaid, Medicare, [place] Health Choice, Veterans' Benefits)  Household income should be no more than 200% of the poverty level The clinic cannot treat you if you are pregnant or think you are pregnant  Sexually transmitted diseases are not treated at the clinic.    Dental Care: Organization         Address  Phone  Notes  [facility] Department of [facility] [place] [phone] Accepts children up to age 37 who are enrolled in [place] or [place]; pregnant women with a Medicaid card; and children who have applied for Medicaid or [place] Health Choice, but were declined, whose parents can pay a reduced fee at time of service.  [facility] Department of [facility]  [address], [place] [phone] Accepts children up to age 70 who are enrolled in [place] or [place]; pregnant women with a Medicaid card; and children who have applied for Medicaid or [place] Health Choice, but were declined, whose parents can pay a reduced fee at time of service.  [facility] Adult [facility] Access PROGRAM  [place] [phone] Patients are seen by appointment only. Walk-ins are not accepted. [facility] [place] will see patients 69 years of age and older. Monday - Tuesday (8am-5pm) Most Wednesdays (8:30-5pm) $30 per visit, cash only  [facility] Adult Dental Access PROGRAM  [address], [facility] [phone] Patients are seen by appointment only. Walk-ins are not accepted. [place] will see patients 61 years of age and older. One Wednesday Evening (Monthly: Volunteer Based).  $30 per visit, cash only   [place]  [phone] for adults; Children under age 28, call Graduate Pediatric Dentistry at [phone]. Children aged 35-14, please call [phone] to request a pediatric application.  Dental services are provided in all areas of dental care including fillings, crowns and bridges, complete and partial dentures, implants, gum treatment, root canals, and extractions. Preventive care is also provided. Treatment is provided to both adults and children. Patients are selected via a lottery and there is often a waiting list.   [facility] [address], [place]  [phone] [email or web address]   [facility] [address] [place], [place] [phone], Ext. [phone] Second and Fourth Thursday of each month, opens at 6:30 AM; Clinic ends at 9 AM.  Patients are seen on a first-come first-served basis, and a limited number are seen during each clinic.   [facility]  [address] Hillard Danker [place], [place] [phone]   Eligibility Requirements You must have lived in [place], [place], or [place] counties for at least the last three months.   You cannot be eligible for state or federal sponsored [employer], including [employer], [place], or [employer].   You generally cannot be eligible for healthcare insurance through your employer.    How to apply: Eligibility screenings are held every Tuesday and Wednesday afternoon from 1:00 pm until 4:00 pm. You do not need an appointment for the interview!  [facility] [address], [place], [place]   [place]  [place] Department  [place]  [phone]    Behavioral Health Resources in the Community: Intensive Outpatient Programs Organization         Address  Phone  Notes  [place]. [address] [place],  [place] [phone]   [facility] Outpatient [address] [place], [place]   ADS: Alcohol & Drug Svcs [address], [place], [place]   [place] [address],  [place], [place] or [phone]   Substance Abuse Resources Organization         Address  Phone  Notes  Alcohol and Drug Services  [phone]   [place]  [phone]   [place]   Chinita Pester  [phone]   Residential & Outpatient Substance Abuse Program  [phone]   Psychological Services Organization         Address  Phone  Notes  [facility]  [place]  [phone]   [place] [address] [place] or [phone]    Mobile Crisis Teams Organization         Address  Phone  Notes  [facility], Mobile Crisis Care Unit  [phone]   [facility]  [address] [place], [place]   [place] Levels [address] [place] [phone]    Self-Help/Support Groups Organization         Address  Phone             Notes  [place]. of [place] - variety of support groups  [place] Call for more information  Narcotics Anonymous (NA), [facility] [address], [employer] [place]  2 meetings at this location   Special educational needs teacher         Address  Phone  Notes  [facility] [place],    [place]  [phone]   [facility]  [address] [place] [id number], [place], [place]   [place], [place] [phone] Admissions: 8am-3pm M-F  Incentives Substance [place] [address],    [place], [place] [phone]   [facility] [address] [place], [place], [place]   [place].,  [place], [place]   [facility] - Intensive Outpatient [address], [place], [place]   [facility] ([place] [address] [place] [address].,  [place], [place] [phone] or [phone]   [facility] Services (RTS) [address] [place], [place] Accepts Medicaid  [facility] [address],  [place] [phone] Substance Abuse/Addiction Treatment   [facility] Organization         Address  Phone  Notes  [facility]  [phone]   Domenic Schwab, PhD [address] Arlis Porta [place], [place]   [phone] or [phone]   [place], [place] [phone]   [facility] [address], [place], [place] [phone] Insurance/Medicaid/sponsorship through [facility] and Families [address] [place]                                    [place], [place] [phone] [place] [address] [place], [place] [phone]    Dr. Adele Schilder  [phone]   [facility] [place] [facility] Dept. 1) [address] [employer] 2) [place] 3)  [place] [address], [place] [phone]  [phone]   [place] [phone] or [phone] (After Hours)

## 2015-05-06 ENCOUNTER — Ambulatory Visit (INDEPENDENT_AMBULATORY_CARE_PROVIDER_SITE_OTHER): Payer: Medicaid Other | Admitting: Infectious Disease

## 2015-05-06 ENCOUNTER — Encounter: Payer: Self-pay | Admitting: Infectious Disease

## 2015-05-06 VITALS — BP 122/80 | HR 113 | Temp 98.0°F | Ht 61.0 in | Wt 113.5 lb

## 2015-05-06 DIAGNOSIS — L989 Disorder of the skin and subcutaneous tissue, unspecified: Secondary | ICD-10-CM | POA: Insufficient documentation

## 2015-05-06 DIAGNOSIS — Z5900 Homelessness unspecified: Secondary | ICD-10-CM

## 2015-05-06 DIAGNOSIS — L98499 Non-pressure chronic ulcer of skin of other sites with unspecified severity: Secondary | ICD-10-CM

## 2015-05-06 DIAGNOSIS — F331 Major depressive disorder, recurrent, moderate: Secondary | ICD-10-CM

## 2015-05-06 DIAGNOSIS — G63 Polyneuropathy in diseases classified elsewhere: Secondary | ICD-10-CM | POA: Diagnosis not present

## 2015-05-06 DIAGNOSIS — Z23 Encounter for immunization: Secondary | ICD-10-CM | POA: Diagnosis not present

## 2015-05-06 DIAGNOSIS — L98491 Non-pressure chronic ulcer of skin of other sites limited to breakdown of skin: Secondary | ICD-10-CM | POA: Diagnosis not present

## 2015-05-06 DIAGNOSIS — B20 Human immunodeficiency virus [HIV] disease: Secondary | ICD-10-CM | POA: Diagnosis not present

## 2015-05-06 HISTORY — DX: Non-pressure chronic ulcer of skin of other sites with unspecified severity: L98.499

## 2015-05-06 LAB — COMPLETE METABOLIC PANEL WITH GFR
ALT: 10 U/L (ref 6–29)
AST: 21 U/L (ref 10–30)
Albumin: 3.3 g/dL — ABNORMAL LOW (ref 3.6–5.1)
Alkaline Phosphatase: 85 U/L (ref 33–115)
BUN: 22 mg/dL (ref 7–25)
CALCIUM: 9 mg/dL (ref 8.6–10.2)
CHLORIDE: 103 mmol/L (ref 98–110)
CO2: 24 mmol/L (ref 20–31)
Creat: 0.71 mg/dL (ref 0.50–1.10)
GFR, Est African American: >89 mL/min (ref >=60)
Glucose, Bld: 77 mg/dL (ref 65–99)
POTASSIUM: 3.8 mmol/L (ref 3.5–5.3)
Sodium: 136 mmol/L (ref 135–146)
Total Bilirubin: 0.5 mg/dL (ref 0.2–1.2)
Total Protein: 9.3 g/dL — ABNORMAL HIGH (ref 6.1–8.1)

## 2015-05-06 LAB — CBC WITH DIFFERENTIAL/PLATELET
BASOS ABS: 0 10*3/uL (ref 0.0–0.1)
BASOS PCT: 1 % (ref 0–1)
Eosinophils Absolute: 0.8 10*3/uL — ABNORMAL HIGH (ref 0.0–0.7)
Eosinophils Relative: 19 % — ABNORMAL HIGH (ref 0–5)
HEMATOCRIT: 28.3 % — AB (ref 36.0–46.0)
HEMOGLOBIN: 8.7 g/dL — AB (ref 12.0–15.0)
LYMPHS PCT: 34 % (ref 12–46)
Lymphs Abs: 1.4 10*3/uL (ref 0.7–4.0)
MCH: 23.6 pg — ABNORMAL LOW (ref 26.0–34.0)
MCHC: 30.7 g/dL (ref 30.0–36.0)
MCV: 76.9 fL — ABNORMAL LOW (ref 78.0–100.0)
MONO ABS: 0.4 10*3/uL (ref 0.1–1.0)
MPV: 9.2 fL (ref 8.6–12.4)
Monocytes Relative: 11 % (ref 3–12)
NEUTROS ABS: 1.4 10*3/uL — AB (ref 1.7–7.7)
NEUTROS PCT: 35 % — AB (ref 43–77)
Platelets: 319 10*3/uL (ref 150–400)
RBC: 3.68 MIL/uL — AB (ref 3.87–5.11)
RDW: 17 % — ABNORMAL HIGH (ref 11.5–15.5)
WBC: 4 10*3/uL (ref 4.0–10.5)

## 2015-05-06 MED ORDER — ALBUTEROL SULFATE HFA 108 (90 BASE) MCG/ACT IN AERS: 2.0000 | INHALATION_SPRAY | Freq: Four times a day (QID) | RESPIRATORY_TRACT | Status: DC | PRN

## 2015-05-06 MED ORDER — PREDNISONE 20 MG PO TABS: ORAL_TABLET | ORAL | Status: DC

## 2015-05-06 MED ORDER — SULFAMETHOXAZOLE-TRIMETHOPRIM 800-160 MG PO TABS: 1.0000 | ORAL_TABLET | Freq: Every day | ORAL | Status: DC

## 2015-05-06 NOTE — Progress Notes (Signed)
Chief complaint: painful leg ulcers and cough  Subjective:    Patient ID: Beverly [name], female    DOB: 07-Mar-1981, 34 y.o.   MRN: [id number]  Cough Associated symptoms include myalgias and a rash. Pertinent negatives include no chest pain, chills, fever, rhinorrhea, sore throat, shortness of breath or wheezing.    34 year old with HIV/AIDS who has been largely noncompliant except for period in January of 2013 where she had an undetectable viral load.  Again absent that period she has had largely uncontrolled viremia and low cd4.   She has been suffering from painful leg ulcers and I performed punch biopsies at last visit with pathology showing spongiotic dermatitis, and cultures for AFB and fungi being negative.   She claim to be taking her Tivicay, Descovy and Bactrim.  Past Medical History  Diagnosis Date  . Anxiety   . Depression   . HIV infection ([place])   . Asthma   . Abscess   . AIDS ([place]) 03/19/2015  . Leg lesion 03/19/2015  . Major depression, recurrent ([place]) 03/19/2015  . Homeless 03/19/2015  . 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 Cesarean section      Family History  Problem Relation Age of Onset  . Hypertension Other   . Cancer Other   . Diabetes Other   . Asthma Other   . Throat cancer Mother   . Throat cancer Father       Social History   Social History  . Marital Status: Single    Spouse Name: N/A  . Number of Children: N/A  . Years of Education: N/A   Social History Main Topics  . Smoking status: Current Every Day Smoker -- 0.30 packs/day for 3 years    Types: Cigarettes  . Smokeless tobacco: Never Used  . Alcohol Use: 0.0 oz/week    0 Standard drinks or equivalent per week     Comment: occasional  . Drug Use: 2.00 per week    Special: Marijuana     Comment: marinol has decreased her need for this  . Sexual Activity: No     Comment: pt. given condoms   Other Topics Concern  .  None   Social History Narrative    Allergies  Allergen Reactions  . Bee Venom Anaphylaxis, Shortness Of Breath and Swelling  . Morphine And Related Hives and Itching    Just can't take "morphine", vicodin is OK     Current outpatient prescriptions:  .  acetaminophen (TYLENOL) 650 MG CR tablet, Take 650 mg by mouth every 8 (eight) hours as needed for pain., Disp: , Rfl:  .  dolutegravir (TIVICAY) 50 MG tablet, Take 1 tablet (50 mg total) by mouth daily., Disp: 30 tablet, Rfl: 11 .  dronabinol (MARINOL) 5 MG capsule, Take 1 capsule (5 mg total) by mouth 2 (two) times daily before a meal., Disp: 60 capsule, Rfl: 2 .  emtricitabine-tenofovir AF (DESCOVY) 200-25 MG tablet, Take 1 tablet by mouth daily., Disp: 30 tablet, Rfl: 11 .  naproxen sodium (ANAPROX) 220 MG tablet, Take 440 mg by mouth 2 (two) times daily as needed (pain)., Disp: , Rfl:  .  Neomycin-Bacitracin-Polymyxin (TRIPLE ANTIBIOTIC) [phone] OINT, Apply 1 application topically 3 (three) times daily as needed (dry, itchy skin)., Disp: , Rfl:  .  sulfamethoxazole-trimethoprim (BACTRIM DS,SEPTRA DS) 800-160 MG tablet, Take 1 tablet by mouth daily., Disp: 30 tablet, Rfl: 11 .  albuterol (  PROVENTIL HFA;VENTOLIN HFA) 108 (90 BASE) MCG/ACT inhaler, Inhale 2 puffs into the lungs every 6 (six) hours as needed for wheezing or shortness of breath., Disp: 1 Inhaler, Rfl: 6 .  hydrocerin (EUCERIN) CREA, Apply 1 application topically 2 (two) times daily. (Patient not taking: Reported on 05/06/2015), Disp: 454 g, Rfl: 0 .  megestrol (MEGACE ES) 625 MG/5ML suspension, Take 5 mLs (625 mg total) by mouth daily. (Patient not taking: Reported on 05/06/2015), Disp: 150 mL, Rfl: 4 .  meloxicam (MOBIC) 7.5 MG tablet, Take 1 tablet (7.5 mg total) by mouth daily as needed (moderate discomfort at affected breast). (Patient not taking: Reported on 05/02/2015), Disp: 10 tablet, Rfl: 0 .  predniSONE (DELTASONE) 20 MG tablet, 40mg  daily x 7 days then 20mg   daily x 7 days, Disp: 21 tablet, Rfl: 1    Review of Systems  Constitutional: Positive for appetite change. Negative for fever, chills, diaphoresis, activity change, fatigue and unexpected weight change.  HENT: Negative for congestion, rhinorrhea, sinus pressure, sneezing, sore throat and trouble swallowing.   Eyes: Negative for photophobia and visual disturbance.  Respiratory: Positive for cough. Negative for chest tightness, shortness of breath, wheezing and stridor.   Cardiovascular: Negative for chest pain, palpitations and leg swelling.  Gastrointestinal: Positive for nausea. Negative for vomiting, abdominal pain, diarrhea, constipation, blood in stool, abdominal distention and anal bleeding.  Genitourinary: Negative for dysuria, hematuria, flank pain and difficulty urinating.  Musculoskeletal: Positive for myalgias. Negative for back pain, joint swelling, arthralgias and gait problem.  Skin: Positive for color change, rash and wound. Negative for pallor.  Neurological: Negative for dizziness, tremors, weakness and light-headedness.  Hematological: Negative for adenopathy. Does not bruise/bleed easily.  Psychiatric/Behavioral: Positive for sleep disturbance. Negative for suicidal ideas, behavioral problems, confusion, self-injury, decreased concentration and agitation. The patient is nervous/anxious.        Objective:   Physical Exam  Constitutional: She is oriented to person, place, and time. No distress.  HENT:  Head: Normocephalic and atraumatic.  Mouth/Throat: No oropharyngeal exudate.  Eyes: Conjunctivae and EOM are normal. No scleral icterus.  Neck: Normal range of motion. Neck supple.  Cardiovascular: Normal rate, regular rhythm and normal heart sounds.  Exam reveals no gallop and no friction rub.   No murmur heard. Pulmonary/Chest: Effort normal. No respiratory distress. She has no wheezes.  Abdominal: She exhibits no distension.  Musculoskeletal: She exhibits no edema.    Neurological: She is alert and oriented to person, place, and time. She exhibits normal muscle tone. Coordination normal.  Skin: Skin is warm and dry. Lesion noted. No rash noted. She is not diaphoretic. There is erythema. No pallor.  Psychiatric: Her behavior is normal. Judgment and thought content normal. Her mood appears anxious. She exhibits a depressed mood.  tearful    03/19/15: Large ulcerated lesion on the right leg with hyperpigmented borders but clearing in the middle. She has other hyperpigmented areas laterally    03/25/15: Leg with less areas of hypopigmentation and perhaps smaller      05/06/15 Right leg ulcers:         03/19/15:    Left leg with areas of hyperpigmentation and excoriation       05/05/25:        Abdomen with thickened skin and excoration             Assessment & Plan:    Skin lesions: not clear what they are. Biopsy c/w with Spongiotic dermatitis. Bactrim made no improvement  Will try  therapeutic trial of systemic prednisone 40mg  daily x 7 days then 20mg  daily x 7 days   HIV/AIDS: continue Descovy, Tivicay and  Bactrim to daily  Depression, grieving: seeing [place] Ormond.   Homeless: will have her work with CM on housing for her  HIV neuropathy: will consider neurontin  I spent greater than 40 minutes with the patient including greater than 50% of time in face to face counsel of the patient re her HIV, AIDS, her skin lesions, her depression, her pain, her HIV neuropathy in coordination of their care.  Alcide Evener, MD

## 2015-05-07 LAB — AFB CULTURE WITH SMEAR (NOT AT ARMC): ACID FAST SMEAR: NONE SEEN

## 2015-05-07 LAB — T-HELPER CELL (CD4) - (RCID CLINIC ONLY)
CD4 T CELL ABS: 10 /uL — AB (ref 400–2700)
CD4 T CELL HELPER: 1 % — AB (ref 33–55)

## 2015-05-08 LAB — HIV RNA, RTPCR W/R GT (RTI, PI,INT)
HIV 1 RNA Quant: 341 copies/mL — ABNORMAL HIGH (ref <20)
HIV-1 RNA QUANT, LOG: 2.53 {Log_copies}/mL — AB (ref <1.30)

## 2015-05-10 ENCOUNTER — Telehealth: Payer: Self-pay | Admitting: *Deleted

## 2015-05-10 NOTE — Telephone Encounter (Signed)
-----   Message from Truman Hayward, MD sent at 05/08/2015 12:48 PM EST ----- [place] HAS VL DOWN TO 300S which means she is taking her meds! Can we reach out to her to congratulate her and encourage her to keep doing what she is diong. She will be undetectable at her next visit if she keeps this up

## 2015-05-10 NOTE — Telephone Encounter (Signed)
Patient notified and she was very happy to hear this. Beverly [name]

## 2015-05-10 NOTE — Telephone Encounter (Signed)
Excellent

## 2015-05-30 ENCOUNTER — Ambulatory Visit: Payer: Medicaid Other | Admitting: Infectious Disease

## 2015-06-30 ENCOUNTER — Encounter (HOSPITAL_COMMUNITY): Payer: Self-pay | Admitting: Family Medicine

## 2015-06-30 ENCOUNTER — Emergency Department (HOSPITAL_COMMUNITY)
Admission: EM | Admit: 2015-06-30 | Discharge: 2015-06-30 | Disposition: A | Discharge status: Home / Self Care | Payer: Medicaid Other | Attending: Emergency Medicine | Admitting: Emergency Medicine

## 2015-06-30 DIAGNOSIS — J019 Acute sinusitis, unspecified: Secondary | ICD-10-CM | POA: Diagnosis not present

## 2015-06-30 DIAGNOSIS — H53149 Visual discomfort, unspecified: Secondary | ICD-10-CM | POA: Diagnosis not present

## 2015-06-30 DIAGNOSIS — Z792 Long term (current) use of antibiotics: Secondary | ICD-10-CM | POA: Diagnosis not present

## 2015-06-30 DIAGNOSIS — Z872 Personal history of diseases of the skin and subcutaneous tissue: Secondary | ICD-10-CM | POA: Diagnosis not present

## 2015-06-30 DIAGNOSIS — B20 Human immunodeficiency virus [HIV] disease: Secondary | ICD-10-CM | POA: Diagnosis not present

## 2015-06-30 DIAGNOSIS — F17210 Nicotine dependence, cigarettes, uncomplicated: Secondary | ICD-10-CM | POA: Diagnosis not present

## 2015-06-30 DIAGNOSIS — H538 Other visual disturbances: Secondary | ICD-10-CM | POA: Diagnosis not present

## 2015-06-30 DIAGNOSIS — Z590 Homelessness: Secondary | ICD-10-CM | POA: Insufficient documentation

## 2015-06-30 DIAGNOSIS — Z79899 Other long term (current) drug therapy: Secondary | ICD-10-CM | POA: Diagnosis not present

## 2015-06-30 DIAGNOSIS — Z8659 Personal history of other mental and behavioral disorders: Secondary | ICD-10-CM | POA: Diagnosis not present

## 2015-06-30 DIAGNOSIS — J45909 Unspecified asthma, uncomplicated: Secondary | ICD-10-CM | POA: Diagnosis not present

## 2015-06-30 DIAGNOSIS — H5713 Ocular pain, bilateral: Secondary | ICD-10-CM | POA: Insufficient documentation

## 2015-06-30 DIAGNOSIS — H9203 Otalgia, bilateral: Secondary | ICD-10-CM | POA: Diagnosis present

## 2015-06-30 LAB — CBC WITH DIFFERENTIAL/PLATELET
BASOS PCT: 1 %
Basophils Absolute: 0 10*3/uL (ref 0.0–0.1)
EOS ABS: 0.2 10*3/uL (ref 0.0–0.7)
EOS PCT: 4 %
HEMATOCRIT: 28.8 % — AB (ref 36.0–46.0)
Hemoglobin: 8.5 g/dL — ABNORMAL LOW (ref 12.0–15.0)
Lymphocytes Relative: 38 %
Lymphs Abs: 1.4 10*3/uL (ref 0.7–4.0)
MCH: 22.2 pg — ABNORMAL LOW (ref 26.0–34.0)
MCHC: 29.5 g/dL — AB (ref 30.0–36.0)
MCV: 75.2 fL — ABNORMAL LOW (ref 78.0–100.0)
MONO ABS: 0.4 10*3/uL (ref 0.1–1.0)
MONOS PCT: 11 %
NEUTROS ABS: 1.8 10*3/uL (ref 1.7–7.7)
Neutrophils Relative %: 46 %
PLATELETS: 368 10*3/uL (ref 150–400)
RBC: 3.83 MIL/uL — ABNORMAL LOW (ref 3.87–5.11)
RDW: 16.2 % — AB (ref 11.5–15.5)
WBC: 3.7 10*3/uL — ABNORMAL LOW (ref 4.0–10.5)

## 2015-06-30 LAB — BASIC METABOLIC PANEL
Anion gap: 7 (ref 5–15)
BUN: 16 mg/dL (ref 6–20)
CALCIUM: 8.8 mg/dL — AB (ref 8.9–10.3)
CO2: 26 mmol/L (ref 22–32)
CREATININE: 0.64 mg/dL (ref 0.44–1.00)
Chloride: 109 mmol/L (ref 101–111)
GFR calc non Af Amer: >60 mL/min (ref >60)
Glucose, Bld: 93 mg/dL (ref 65–99)
Potassium: 3.4 mmol/L — ABNORMAL LOW (ref 3.5–5.1)
Sodium: 142 mmol/L (ref 135–145)

## 2015-06-30 LAB — RAPID STREP SCREEN (MED CTR MEBANE ONLY): STREPTOCOCCUS, GROUP A SCREEN (DIRECT): NEGATIVE

## 2015-06-30 MED ORDER — AMOXICILLIN-POT CLAVULANATE 875-125 MG PO TABS
1.0000 | ORAL_TABLET | Freq: Once | ORAL | Status: AC
  Administered 2015-06-30: 1 via ORAL
  Filled 2015-06-30: qty 1

## 2015-06-30 MED ORDER — AMOXICILLIN-POT CLAVULANATE 875-125 MG PO TABS: 1.0000 | ORAL_TABLET | Freq: Two times a day (BID) | ORAL | Status: DC

## 2015-06-30 NOTE — ED Provider Notes (Signed)
CSN: [id number]     Arrival date & time 06/30/15  1931 History  By signing my name below, I, [name], attest that this documentation has been prepared under the direction and in the presence of [employer], PA-C. Electronically Signed: [name], ED Scribe. 06/30/2015. 11:00 PM.    Chief Complaint  Patient presents with  . Otalgia  . Eye Pain   The history is provided by the patient and medical records. No language interpreter was used.   HPI Comments: Beverly [name] is a 35 y.o. female with a history of HIV, asthma, depression, and anxiety, who presents to the Emergency Department complaining of gradual-onset, constant, worsening, 7/10, aching bilateral otalgia, left greater than right, that began 1 week ago, as well as bilateral eye pain, right greater than left, that began 3 days ago. She also complains of associated thick, green, eye drainage - noting that her eyes are crusted shut in the morning -, blurred vision, and a fever with a Tmax of 102 about 3 days ago that was reduced with Tylenol. Patient states she had a URI last week but otherwise denies any known sick contact. 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 She had applied warm compresses to her ears and eyes with minimal relief. Patient states she does not wear contact lenses. Lying on her ears exacerbate her pain. Patient states she had a flu vaccine this year, due to her history of HIV.   She denies any cough, shortness of breath, chest pain, abdominal pain, nausea, vomiting, or diarrhea.  Patient also reports she has been noncompliant with her HIV medications until recently ( 2 months ago) and that she required a follow-up for a thyroid condition, which she states she did not follow through with due to family stressors. Per medical records, patient's last CD4 count 2 months ago was 10. Patient was unaware of this number, as she did not return to the office for her  results.   Past Medical History  Diagnosis Date  . Anxiety   . Depression   . HIV infection ([place])   . Asthma   . Abscess   . AIDS ([place]) 03/19/2015  . Leg lesion 03/19/2015  . Major depression, recurrent ([place]) 03/19/2015  . Homeless 03/19/2015  . 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 Skin ulcer ([place]) 05/06/2015   Past Surgical History  Procedure Laterality Date  . Cesarean section     Family History  Problem Relation Age of Onset  . Hypertension Other   . Cancer Other   . Diabetes Other   . Asthma Other   . Throat cancer Mother   . Throat cancer Father    Social History  Substance Use Topics  . Smoking status: Current Every Day Smoker -- 0.30 packs/day for 3 years    Types: Cigarettes  . Smokeless tobacco: Never Used  . Alcohol Use: 0.0 oz/week    0 Standard drinks or equivalent per week     Comment: 5-6 drinks a month.    OB History    No data available     Review of Systems A complete 10 system review of systems was obtained and all systems are negative except as noted in the HPI and PMH.    Allergies  Bee venom and Morphine and related  Home Medications   Prior to Admission medications   Medication Sig Start Date End Date Taking? Authorizing Provider  acetaminophen (TYLENOL) 650 MG CR tablet Take 650 mg by mouth every 8 (eight) hours as needed for pain.    Historical Provider, MD  albuterol (PROVENTIL HFA;VENTOLIN HFA) 108 (90 BASE) MCG/ACT inhaler Inhale 2 puffs into the lungs every 6 (six) hours as needed for wheezing or shortness of breath. 05/06/15   Truman Hayward, MD  amoxicillin-clavulanate (AUGMENTIN) 875-125 MG tablet Take 1 tablet by mouth every 12 (twelve) hours. 06/30/15   Samaj Wessells Patel-Mills, PA-C  dolutegravir (TIVICAY) 50 MG tablet Take 1 tablet (50 mg total) by mouth daily. 03/19/15   Truman Hayward, MD  dronabinol (MARINOL) 5 MG capsule Take 1 capsule (5 mg total) by mouth 2 (two) times daily before a meal. 03/25/15   Truman Hayward,  MD  emtricitabine-tenofovir AF (DESCOVY) 200-25 MG tablet Take 1 tablet by mouth daily. 03/19/15   Truman Hayward, MD  hydrocerin (EUCERIN) CREA Apply 1 application topically 2 (two) times daily. Patient not taking: Reported on 05/06/2015 03/05/15   Michel Bickers, MD  megestrol (MEGACE ES) 625 MG/5ML suspension Take 5 mLs (625 mg total) by mouth daily. Patient not taking: Reported on 05/06/2015 04/04/15   Truman Hayward, MD  meloxicam (MOBIC) 7.5 MG tablet Take 1 tablet (7.5 mg total) by mouth daily as needed (moderate discomfort at affected breast). Patient not taking: Reported on 05/02/2015 08/16/14   Velta Addison Mikhail, DO  naproxen sodium (ANAPROX) 220 MG tablet Take 440 mg by mouth 2 (two) times daily as needed (pain).    Historical Provider, MD  Neomycin-Bacitracin-Polymyxin (TRIPLE ANTIBIOTIC) [phone] OINT Apply 1 application topically 3 (three) times daily as needed (dry, itchy skin).    Historical Provider, MD  predniSONE (DELTASONE) 20 MG tablet 40mg  daily x 7 days then 20mg  daily x 7 days 05/06/15   Truman Hayward, MD  sulfamethoxazole-trimethoprim (BACTRIM DS,SEPTRA DS) 800-160 MG tablet Take 1 tablet by mouth daily. 05/06/15   Truman Hayward, MD   BP 110/71 mmHg  Pulse 75  Temp(Src) 97.6 F (36.4 C) (Oral)  Resp 18  Ht 5\' 1"  (1.549 m)  Wt 63.504 kg  BMI 26.47 kg/m2  SpO2 100%  LMP 06/17/2015 Physical Exam  Constitutional: She is oriented to person, place, and time. She appears well-developed and well-nourished. No distress.  HENT:  Head: Normocephalic and atraumatic.  Nasal congestion. Bilateral TMs and canals appear normal. No tonsillar edema or exudates. Oropharynx is clear and moist.  Eyes: Conjunctivae and EOM are normal. Pupils are equal, round, and reactive to light.  She has photophobia. Conjunctiva appear normal and are without jaundice. PERRL. No periorbital erythema or swelling. She has no drainage or crusting of the eyes.   Neck: Neck supple.  No tracheal deviation present.  She has no anterior cervical lymphadenopathy or pre- or post-auricular lymphadenopathy.   Cardiovascular: Normal rate.   Pulmonary/Chest: Effort normal. No respiratory distress. She has no decreased breath sounds. She has no wheezes.  Lungs are clear to auscultation bilaterally. No wheezing or decreased breath sounds.    Musculoskeletal: Normal range of motion.  Neurological: She is alert and oriented to person, place, and time.  Skin: Skin is warm and dry.  Psychiatric: She has a normal mood and affect. Her behavior is normal.  Nursing note and vitals reviewed.   ED Course  Procedures (including critical care time)  DIAGNOSTIC STUDIES: Oxygen Saturation is 100% on RA, normal by my interpretation.    COORDINATION OF CARE: 9:38 PM -  Discussed treatment plan with pt at bedside. Pt verbalized understanding and agreed to plan.   Labs Review Labs Reviewed  CBC WITH DIFFERENTIAL/PLATELET - Abnormal; Notable for the following:    WBC 3.7 (*)    RBC 3.83 (*)    Hemoglobin 8.5 (*)    HCT 28.8 (*)    MCV 75.2 (*)    MCH 22.2 (*)    MCHC 29.5 (*)    RDW 16.2 (*)    All other components within normal limits  BASIC METABOLIC PANEL - Abnormal; Notable for the following:    Potassium 3.4 (*)    Calcium 8.8 (*)    All other components within normal limits  RAPID STREP SCREEN (NOT AT [facility])  CULTURE, GROUP A STREP    Imaging Review No results found. I have personally reviewed and evaluated these images and lab results as part of my medical decision-making.   EKG Interpretation None      MDM   Final diagnoses:  Acute sinusitis, recurrence not specified, unspecified location  Patient with history of HIV comes in for bilateral eye pain and ear pain. Last CD4 count of 10. Due to her history and noncompliance with medications, labs were ordered. She is afebrile and vitals are stable in the ED. Her exam is concerning for sinusitis. Labs are similar to  previous labs. Strep is negative. She was treated with Augmentin. I discussed return precautions with the patient as well as follow-up. She states she does not have a PCP and was given the resource guide. She agrees with plan.  I personally performed the services described in this documentation, which was scribed in my presence. The recorded information has been reviewed and is accurate.     Ottie Glazier, PA-C 06/30/15 2300  Lacretia Leigh, MD 07/03/15 [phone]

## 2015-06-30 NOTE — ED Notes (Signed)
Patient is complaining of bilateral ear pain, more in left ear. Denies any drainage. Also, complains of bilateral eye pain, but worse on right eye. Right eye drainage: thick, green. Pt has administering OTC eye drops.

## 2015-06-30 NOTE — Discharge Instructions (Signed)
Sinusitis, Adult It is important for you to find a primary care physician. Use the resource guide below to find one. Return for fever or worsening symptoms. Sinusitis is redness, soreness, and inflammation of the paranasal sinuses. Paranasal sinuses are air pockets within the bones of your face. They are located beneath your eyes, in the middle of your forehead, and above your eyes. In healthy paranasal sinuses, mucus is able to drain out, and air is able to circulate through them by way of your nose. However, when your paranasal sinuses are inflamed, mucus and air can become trapped. This can allow bacteria and other germs to grow and cause infection. Sinusitis can develop quickly and last only a short time (acute) or continue over a long period (chronic). Sinusitis that lasts for more than 12 weeks is considered chronic. CAUSES Causes of sinusitis include:  Allergies.  Structural abnormalities, such as displacement of the cartilage that separates your nostrils (deviated septum), which can decrease the air flow through your nose and sinuses and affect sinus drainage.  Functional abnormalities, such as when the small hairs (cilia) that line your sinuses and help remove mucus do not work properly or are not present. SIGNS AND SYMPTOMS Symptoms of acute and chronic sinusitis are the same. The primary symptoms are pain and pressure around the affected sinuses. Other symptoms include:  Upper toothache.  Earache.  Headache.  Bad breath.  Decreased sense of smell and taste.  A cough, which worsens when you are lying flat.  Fatigue.  Fever.  Thick drainage from your nose, which often is green and may contain pus (purulent).  Swelling and warmth over the affected sinuses. DIAGNOSIS Your health care provider will perform a physical exam. During your exam, your health care provider may perform any of the following to help determine if you have acute sinusitis or chronic sinusitis:  Look in  your nose for signs of abnormal growths in your nostrils (nasal polyps).  Tap over the affected sinus to check for signs of infection.  View the inside of your sinuses using an imaging device that has a light attached (endoscope). If your health care provider suspects that you have chronic sinusitis, one or more of the following tests may be recommended:  Allergy tests.  Nasal culture. A sample of mucus is taken from your nose, sent to a lab, and screened for bacteria.  Nasal cytology. A sample of mucus is taken from your nose and examined by your health care provider to determine if your sinusitis is related to an allergy. TREATMENT Most cases of acute sinusitis are related to a viral infection and will resolve on their own within 10 days. Sometimes, medicines are prescribed to help relieve symptoms of both acute and chronic sinusitis. These may include pain medicines, decongestants, nasal steroid sprays, or saline sprays. However, for sinusitis related to a bacterial infection, your health care provider will prescribe antibiotic medicines. These are medicines that will help kill the bacteria causing the infection. Rarely, sinusitis is caused by a fungal infection. In these cases, your health care provider will prescribe antifungal medicine. For some cases of chronic sinusitis, surgery is needed. Generally, these are cases in which sinusitis recurs more than 3 times per year, despite other treatments. HOME CARE INSTRUCTIONS  Drink plenty of water. Water helps thin the mucus so your sinuses can drain more easily.  Use a humidifier.  Inhale steam 3-4 times a day (for example, sit in the bathroom with the shower running).  Apply a  warm, moist washcloth to your face 3-4 times a day, or as directed by your health care provider.  Use saline nasal sprays to help moisten and clean your sinuses.  Take medicines only as directed by your health care provider.  If you were prescribed either an  antibiotic or antifungal medicine, finish it all even if you start to feel better. SEEK IMMEDIATE MEDICAL CARE IF:  You have increasing pain or severe headaches.  You have nausea, vomiting, or drowsiness.  You have swelling around your face.  You have vision problems.  You have a stiff neck.  You have difficulty breathing.   This information is not intended to replace advice given to you by your health care provider. Make sure you discuss any questions you have with your health care provider.   Document Released: 06/08/2005 Document Revised: 06/29/2014 Document Reviewed: 06/23/2011 Elsevier Interactive Patient Education 2016 [employer].  Emergency Department Resource Guide 1) Find a Doctor and Pay Out of Pocket Although you won't have to find out who is covered by your insurance plan, it is a good idea to ask around and get recommendations. You will then need to call the office and see if the doctor you have chosen will accept you as a new patient and what types of options they offer for patients who are self-pay. Some doctors offer discounts or will set up payment plans for their patients who do not have insurance, but you will need to ask so you aren't surprised when you get to your appointment.  2) Contact Your Local Health Department Not all health departments have doctors that can see patients for sick visits, but many do, so it is worth a call to see if yours does. If you don't know where your local health department is, you can check in your phone book. The CDC also has a tool to help you locate your state's health department, and many state websites also have listings of all of their local health departments.  3) Find a [facility] If your illness is not likely to be very severe or complicated, you may want to try a walk in clinic. These are popping up all over the country in pharmacies, drugstores, and shopping centers. They're usually staffed by nurse practitioners or  physician assistants that have been trained to treat common illnesses and complaints. They're usually fairly quick and inexpensive. However, if you have serious medical issues or chronic medical problems, these are probably not your best option.  No Primary Care Doctor: - Call Health Connect at  [phone] - they can help you locate a primary care doctor that  accepts your insurance, provides certain services, etc. - Physician Referral Service- [phone]  Chronic Pain Problems: Organization         Address  Phone   Notes  [facility]  [phone] Patients need to be referred by their primary care doctor.   Medication Assistance: Organization         Address  Phone   Notes  [facility] Medication [facility] [address] [place], [place] [address] [phone] --Must be a resident of [facility] -- Must have NO insurance coverage whatsoever (no Medicaid/ Medicare, etc.) -- The pt. MUST have a primary care doctor that directs their care regularly and follows them in the community   [facility]ist  [phone]   [employer]  [phone]    Agencies that provide inexpensive medical care: Organization  Address  Phone   Notes  [place]  [phone]   [facility]    [phone]   [facility] [place], [place] [address] [phone]   [place] [address], [place] [phone]   Planned Parenthood    [phone]   [facility]    [phone]   [place] and [place] [address], [place] Phone:  [phone], Fax:  [phone] Hours of Operation:  9 am - 6 pm, M-F.  Also accepts Medicaid/Medicare and self-pay.  [facility] for [place], [address], [place] Phone: [phone], Fax: [phone]. Hours of Operation:  8:30 am - 5:30 pm, M-F.   Also accepts Medicaid and self-pay.  [facility] [place] [address], [place] Phone: [phone]   [place], [place], [place] [phone], Ext. [phone] Mondays & Thursdays: 7-9 AM.  First 15 patients are seen on a first come, first serve basis.    [place] Providers:  Organization         Address  Phone   Notes  [facility] [address], [place] [phone] Also accepts self-pay patients.  [facility] [id number] [place], [place]  [phone]   [place], [address], [place] [phone]   [facility] [address] [place] [phone]   Lucianne Lei [address], [place]   [phone] Only accepts [place] Access [place] patients after they have their name applied to their card.   Self-Pay (no insurance) in [facility]:  Organization         Address  Phone   Notes  Sickle Cell Patients, [facility] [place] [phone]   [facility] Urgent Care [place] [phone]   [facility] [place]  [place], [place], [place] [phone]   [facility]/Dr. Osei-Bonsu  [address] [place] or [address], [place] [phone] Phone number for both [place] and [place] locations is the same.  Urgent Medical and [facility] [address] [place] [phone]   [facility] [address], [place] or [address] [phone]   [facility] [address], [place] [phone], phone; [phone], fax Sees patients 1st and 3rd Saturday of every month.  Must not qualify for public or private insurance (i.e. Medicaid, Medicare, [place] Health Choice, Veterans'  Benefits)  Household income should be no more than 200% of the poverty level The clinic cannot treat you if you are pregnant or think you are pregnant  Sexually transmitted diseases are not treated at the clinic.    Dental Care: Organization         Address  Phone  Notes  [facility] Department of [facility] [place] [phone] Accepts children up to age 61 who are enrolled in [place] or [place]; pregnant women with a Medicaid card; and children who have applied for Medicaid or [place] Health Choice, but were declined, whose parents can pay a reduced fee at time  of service.  [facility] Department of [facility]  [address], [place] [phone] Accepts children up to age 94 who are enrolled in [place] or [place]; pregnant women with a Medicaid card; and children who have applied for Medicaid or [place] Health Choice, but were declined, whose parents can pay a reduced fee at time of service.  [place] Adult Dental Access PROGRAM  [place] [phone] Patients are seen by appointment only. Walk-ins are not accepted. [place] will see patients 86 years of age and older. Monday - Tuesday (8am-5pm) Most Wednesdays (8:30-5pm) $30 per visit, cash only  [facility] Adult Dental Access PROGRAM  [address], [facility] [phone] Patients are seen by appointment only. Walk-ins are not accepted. [place] will see patients 56 years of age and older. One Wednesday Evening (Monthly: Volunteer Based).  $30 per visit, cash only  [place]  [phone] for adults; Children under age 80, call Graduate Pediatric Dentistry at [phone]. Children aged 84-14, please call [phone] to request a pediatric application.  Dental services are provided in all areas of dental care including fillings, crowns and bridges, complete and partial  dentures, implants, gum treatment, root canals, and extractions. Preventive care is also provided. Treatment is provided to both adults and children. Patients are selected via a lottery and there is often a waiting list.   [facility] [address], [place]  [phone] [email or web address]   [facility] [address] [place], [place] [phone], Ext. [phone] Second and Fourth Thursday of each month, opens at 6:30 AM; Clinic ends at 9 AM.  Patients are seen on a first-come first-served basis, and a limited number are seen during each clinic.   [facility]  [address] Hillard Danker [place], [place] [phone]   Eligibility Requirements You must have lived in [place], [place], or [place] counties for at least the last three months.   You cannot be eligible for state or federal sponsored [employer], including [employer], [place], or [employer].   You generally cannot be eligible for healthcare insurance through your employer.    How to apply: Eligibility screenings are held every Tuesday and Wednesday afternoon from 1:00 pm until 4:00 pm. You do not need an appointment for the interview!  [facility] [address], [place], [place]   [place]  [place] Department  [place]  [phone]    Behavioral Health Resources in the Community: Intensive Outpatient Programs Organization         Address  Phone  Notes  [place]. [address] [place], [place] [phone]   [facility] Outpatient [address], [place], [place]   ADS: Alcohol & Drug Svcs [address], [place], [place]   [place] [address],  [place], [place] or [phone]   Substance Abuse Resources Organization          Address  Phone  Notes  Alcohol and Drug Services  [phone]   [place]  [phone]   The [place]   Chinita Pester  [phone]   Residential & Outpatient Substance Abuse Program  [phone]   Psychological Services Organization         Address  Phone  Notes  [facility]  [place]  [phone]   [place] [address] [place] or [phone]    Mobile Crisis Teams Organization         Address  Phone  Notes  [facility], Mobile Crisis Care Unit  [phone]   [facility]  [address] [place], [place]   [place] Levels [address] [place] [phone]    Self-Help/Support Groups Organization         Address  Phone             Notes  [place]. of [place] - variety of support groups  [place] Call for more information  Narcotics Anonymous (NA), [facility] [address], [employer] [place]  2 meetings at this location   Special educational needs teacher         Address  Phone  Notes  [facility] Residential Treatment [place],    [place]  [phone]   [facility]  [address], [place] [id number], [place], [place]   [place], [place] [phone] Admissions: 8am-3pm M-F  Incentives Substance [place] [address]    [place], [place] [phone]   [facility] [address] [place], [place], [place]   [facility] [address]  [place], [place]   [facility] - Intensive Outpatient [address], [place], [place]   [facility] ([place] [address]  [place], [place] [phone] or [phone]   [facility] Services (RTS) [address] [place], [place] Accepts Medicaid  [facility] [place] [address]  [place] [phone] Substance Abuse/Addiction Treatment   [facility] Organization         Address  Phone  Notes  [facility]  [phone]   Domenic Schwab, PhD [address] [place], [place]   [phone] or [phone]   [place], [place] [phone]   [facility] [address], [place], [place] [phone] Insurance/Medicaid/sponsorship through [facility] and Families [address], [address] [place]                                    [place], [place] [phone] [place] [address], [place] [phone]    Dr. Adele Schilder  [phone]   [facility] Dept. 1) [address], [place] 2) [place] 3)  [place] [address], [place] [phone]  [phone]   [place] [phone] or [phone] (After Hours)

## 2015-07-03 LAB — CULTURE, GROUP A STREP: Strep A Culture: NEGATIVE

## 2015-07-25 NOTE — Progress Notes (Unsigned)
Patient ID: Beverly [name], female   DOB: Aug 26, 1980, 35 y.o.   MRN: [id number] After receiving a signed release of information via fax, I spoke with Vickey Huger from [place] by phone, verifying her diagnosis and summary of treatment here by me.  Ms. Sabra Heck is closing her case and wanted input from me.  I told her of her diagnoses of Generalized Anxiety Disorder, Panic Disorder, and Major Depressive Disorder, with a Rule Out of PTSD.  I told her I had seen some progress with her over the course of a handful of sessions, but had not seen her since October, likely due to her having so many case management issues (housing, etc.).  I told her I had no safety concerns. Curley Spice, LCSW

## 2015-08-26 ENCOUNTER — Encounter (HOSPITAL_COMMUNITY): Payer: Self-pay | Admitting: *Deleted

## 2015-08-26 ENCOUNTER — Emergency Department (HOSPITAL_COMMUNITY)
Admission: EM | Admit: 2015-08-26 | Discharge: 2015-08-26 | Disposition: A | Discharge status: Home / Self Care | Payer: Medicaid Other | Attending: Physician Assistant | Admitting: Physician Assistant

## 2015-08-26 DIAGNOSIS — L0291 Cutaneous abscess, unspecified: Secondary | ICD-10-CM | POA: Diagnosis not present

## 2015-08-26 DIAGNOSIS — Z79899 Other long term (current) drug therapy: Secondary | ICD-10-CM | POA: Insufficient documentation

## 2015-08-26 DIAGNOSIS — J45909 Unspecified asthma, uncomplicated: Secondary | ICD-10-CM | POA: Insufficient documentation

## 2015-08-26 DIAGNOSIS — R200 Anesthesia of skin: Secondary | ICD-10-CM | POA: Insufficient documentation

## 2015-08-26 DIAGNOSIS — F17210 Nicotine dependence, cigarettes, uncomplicated: Secondary | ICD-10-CM | POA: Insufficient documentation

## 2015-08-26 DIAGNOSIS — F419 Anxiety disorder, unspecified: Secondary | ICD-10-CM | POA: Insufficient documentation

## 2015-08-26 DIAGNOSIS — Z8659 Personal history of other mental and behavioral disorders: Secondary | ICD-10-CM | POA: Diagnosis not present

## 2015-08-26 DIAGNOSIS — Z590 Homelessness: Secondary | ICD-10-CM | POA: Insufficient documentation

## 2015-08-26 DIAGNOSIS — B20 Human immunodeficiency virus [HIV] disease: Secondary | ICD-10-CM | POA: Diagnosis not present

## 2015-08-26 MED ORDER — SULFAMETHOXAZOLE-TRIMETHOPRIM 800-160 MG PO TABS: 1.0000 | ORAL_TABLET | Freq: Two times a day (BID) | ORAL | Status: AC

## 2015-08-26 NOTE — ED Provider Notes (Signed)
CSN: [id number]     Arrival date & time 08/26/15  1511 History   First MD Initiated Contact with Patient 08/26/15 2016     Chief Complaint  Patient presents with  . Abscess  . Numbness   (Consider location/radiation/quality/duration/timing/severity/associated sxs/prior Treatment) HPI  35 y.o. female with a hx of HIV, presents to the Emergency Department today complaining of multiple abscess formations. Notes previous hx, but never had any on her face. Pt states that 4 days ago she was at work and noticed small bumps on her face. Used alcohol on bumps, cleaned thouroughly and used neosporin. Two days later, patient noticed that the boils were getting bigger and started becoming painful. Went to UC today and told to come to ED due to numbness symptoms along face. States pain is 8/10 and throbbing. Some Nausea. No V/D. No CP/SOB/ABD pain. No other symptoms noted   Past Medical History  Diagnosis Date  . Anxiety   . Depression   . HIV infection ([place])   . Asthma   . Abscess   . AIDS ([place]) 03/19/2015  . Leg lesion 03/19/2015  . Major depression, recurrent ([place]) 03/19/2015  . Homeless 03/19/2015  . 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 Skin ulcer ([place]) 05/06/2015   Past Surgical History  Procedure Laterality Date  . Cesarean section     Family History  Problem Relation Age of Onset  . Hypertension Other   . Cancer Other   . Diabetes Other   . Asthma Other   . Throat cancer Mother   . Throat cancer Father    Social History  Substance Use Topics  . Smoking status: Current Every Day Smoker -- 0.30 packs/day for 3 years    Types: Cigarettes  . Smokeless tobacco: Never Used  . Alcohol Use: 0.0 oz/week    0 Standard drinks or equivalent per week     Comment: 5-6 drinks a month.    OB History    No data available     Review of Systems ROS reviewed and all are negative for acute change except as noted in the HPI.  Allergies  Bee venom and Morphine and related  Home  Medications   Prior to Admission medications   Medication Sig Start Date End Date Taking? Authorizing Provider  acetaminophen (TYLENOL) 650 MG CR tablet Take 650 mg by mouth every 8 (eight) hours as needed for pain.   Yes Historical Provider, MD  albuterol (PROVENTIL HFA;VENTOLIN HFA) 108 (90 BASE) MCG/ACT inhaler Inhale 2 puffs into the lungs every 6 (six) hours as needed for wheezing or shortness of breath. 05/06/15  Yes Truman Hayward, MD  dolutegravir (TIVICAY) 50 MG tablet Take 1 tablet (50 mg total) by mouth daily. 03/19/15  Yes Truman Hayward, MD  emtricitabine-tenofovir AF (DESCOVY) 200-25 MG tablet Take 1 tablet by mouth daily. 03/19/15  Yes Truman Hayward, MD  hydrocerin (EUCERIN) CREA Apply 1 application topically 2 (two) times daily. 03/05/15  Yes Michel Bickers, MD  Neomycin-Bacitracin-Polymyxin (TRIPLE ANTIBIOTIC) [phone] OINT Apply 1 application topically 3 (three) times daily as needed (dry, itchy skin).   Yes Historical Provider, MD  amoxicillin-clavulanate (AUGMENTIN) 875-125 MG tablet Take 1 tablet by mouth every 12 (twelve) hours. 06/30/15   Hanna Patel-Mills, PA-C  dronabinol (MARINOL) 5 MG capsule Take 1 capsule (5 mg total) by mouth 2 (two) times daily before a meal. 03/25/15   Truman Hayward, MD  megestrol (MEGACE ES) 625 MG/5ML suspension  Take 5 mLs (625 mg total) by mouth daily. Patient not taking: Reported on 05/06/2015 04/04/15   Truman Hayward, MD  meloxicam (MOBIC) 7.5 MG tablet Take 1 tablet (7.5 mg total) by mouth daily as needed (moderate discomfort at affected breast). Patient not taking: Reported on 05/02/2015 08/16/14   Velta Addison Mikhail, DO  predniSONE (DELTASONE) 20 MG tablet 40mg  daily x 7 days then 20mg  daily x 7 days 05/06/15   Truman Hayward, MD  sulfamethoxazole-trimethoprim (BACTRIM DS,SEPTRA DS) 800-160 MG tablet Take 1 tablet by mouth daily. 05/06/15   Truman Hayward, MD   BP 108/81 mmHg  Pulse 76  Temp(Src) 97.6 F  (36.4 C) (Oral)  Resp 16  SpO2 100%  LMP 08/06/2015   Physical Exam  Constitutional: She is oriented to person, place, and time. She appears well-developed and well-nourished.  HENT:  Head: Normocephalic and atraumatic.  Eyes: EOM are normal. Pupils are equal, round, and reactive to light.  Neck: Normal range of motion. Neck supple.  Cardiovascular: Normal rate, regular rhythm and normal heart sounds.   Pulmonary/Chest: Effort normal and breath sounds normal.  Abdominal: Soft. There is no tenderness.  Musculoskeletal: Normal range of motion.  Neurological: She is alert and oriented to person, place, and time. She has normal strength. No cranial nerve deficit or sensory deficit.  Skin: Skin is warm and dry.  Multiple abscess formations <1cm scattered around body. No signs of infection. No Erythema. No purulent drainage.   Psychiatric: She has a normal mood and affect. Her behavior is normal. Thought content normal.  Nursing note and vitals reviewed.  ED Course  Procedures (including critical care time) Labs Review Labs Reviewed - No data to display  Imaging Review No results found. I have personally reviewed and evaluated these images and lab results as part of my medical decision-making.   EKG Interpretation None      MDM  I have reviewed the relevant previous healthcare records. I obtained HPI from historian. Patient discussed with supervising physician  ED Course:  Assessment: 34y F presents with multiple abscess formation <1cm scattered around body. Sent from UC due to numbness on face. Pt states they were worried about stroke. On exam, pt in NAD. VSS. Afebrile. Lungs CTA. Heart RRR. Abscesses are non erythematous. Non purulent. Non infectious looking TTP. CN evaluated an intact. No motor/sensory deficits. Low suspicion for stroke. Will dc patient home with bactrim and given referral to dermatology. At time of discharge, Patient is in no acute distress. Vital Signs are  stable. Patient is able to ambulate. Patient able to tolerate PO.     Disposition/Plan:  DC Home Additional Verbal discharge instructions given and discussed with patient.  Pt Instructed to f/u with Dermatologist  Return precautions given Pt acknowledges and agrees with plan  Supervising Physician Courteney Julio Alm, MD   Final diagnoses:  Abscess       Shary Decamp, PA-C 08/26/15 2104  Courteney Lyn Mackuen, MD 08/28/15 [id number]

## 2015-08-26 NOTE — Discharge Instructions (Signed)
Please read and follow all provided instructions.  Your diagnoses today include:  1. Abscess    Tests performed today include:  Vital signs. See below for your results today.   Medications prescribed:   Take any prescribed medications only as directed.   Home care instructions:   Follow any educational materials contained in this packet  Follow-up instructions: Follow up with Outpatient Dermatologist for further evaluation  Please follow-up with your primary care provider in the next 1 week for further evaluation of your symptoms.   Return instructions:  Return to the Emergency Department if you have:  Fever  Worsening symptoms  Worsening pain  Worsening swelling  Redness of the skin that moves away from the affected area, especially if it streaks away from the affected area   Any other emergent concerns  Additional Information: If you have recurrent abscesses, try both the following. Use a Qtip to apply an over-the-counter antibiotic to the inside of your nostrils, twice a day for 5 days. Wash your body with over-the-counter Hibaclens once a day for one week and then once every two weeks. This can reduce the amount of bacterial on your skin that causes boils and lead to fewer boils. If you continue to have multiple or recurrent boils, you should see a dermatologist (skin doctor).   Your vital signs today were: BP 108/81 mmHg   Pulse 76   Temp(Src) 97.6 F (36.4 C) (Oral)   Resp 16   SpO2 100%   LMP 08/06/2015 If your blood pressure (BP) was elevated above 135/85 this visit, please have this repeated by your doctor within one month. --------------

## 2015-08-26 NOTE — ED Notes (Signed)
Pt arrived by [facility]. Pt went to an ucc for multiple boils to face, head and neck. Reports also having one to buttock recently. Pt also reports tingling sensation to bilateral hands and feet since yesterday.

## 2015-09-20 ENCOUNTER — Encounter (HOSPITAL_COMMUNITY): Payer: Self-pay | Admitting: Emergency Medicine

## 2015-09-20 ENCOUNTER — Emergency Department (HOSPITAL_COMMUNITY): Payer: Medicaid Other

## 2015-09-20 ENCOUNTER — Emergency Department (HOSPITAL_COMMUNITY)
Admission: EM | Admit: 2015-09-20 | Discharge: 2015-09-20 | Disposition: A | Discharge status: Home / Self Care | Payer: Medicaid Other | Attending: Emergency Medicine | Admitting: Emergency Medicine

## 2015-09-20 ENCOUNTER — Emergency Department (HOSPITAL_COMMUNITY)
Admission: EM | Admit: 2015-09-20 | Discharge: 2015-09-20 | Disposition: A | Discharge status: Home / Self Care | Payer: Medicaid Other | Source: Home / Self Care

## 2015-09-20 DIAGNOSIS — Z8669 Personal history of other diseases of the nervous system and sense organs: Secondary | ICD-10-CM | POA: Insufficient documentation

## 2015-09-20 DIAGNOSIS — B20 Human immunodeficiency virus [HIV] disease: Secondary | ICD-10-CM | POA: Diagnosis not present

## 2015-09-20 DIAGNOSIS — F17210 Nicotine dependence, cigarettes, uncomplicated: Secondary | ICD-10-CM | POA: Insufficient documentation

## 2015-09-20 DIAGNOSIS — Z79899 Other long term (current) drug therapy: Secondary | ICD-10-CM | POA: Diagnosis not present

## 2015-09-20 DIAGNOSIS — Z590 Homelessness: Secondary | ICD-10-CM | POA: Diagnosis not present

## 2015-09-20 DIAGNOSIS — J45909 Unspecified asthma, uncomplicated: Secondary | ICD-10-CM | POA: Diagnosis not present

## 2015-09-20 DIAGNOSIS — Y9389 Activity, other specified: Secondary | ICD-10-CM | POA: Diagnosis not present

## 2015-09-20 DIAGNOSIS — W108XXA Fall (on) (from) other stairs and steps, initial encounter: Secondary | ICD-10-CM | POA: Diagnosis not present

## 2015-09-20 DIAGNOSIS — Y9289 Other specified places as the place of occurrence of the external cause: Secondary | ICD-10-CM | POA: Insufficient documentation

## 2015-09-20 DIAGNOSIS — S3219XA Other fracture of sacrum, initial encounter for closed fracture: Secondary | ICD-10-CM | POA: Insufficient documentation

## 2015-09-20 DIAGNOSIS — Z8659 Personal history of other mental and behavioral disorders: Secondary | ICD-10-CM | POA: Insufficient documentation

## 2015-09-20 DIAGNOSIS — Y998 Other external cause status: Secondary | ICD-10-CM | POA: Diagnosis not present

## 2015-09-20 DIAGNOSIS — S322XXA Fracture of coccyx, initial encounter for closed fracture: Secondary | ICD-10-CM | POA: Diagnosis not present

## 2015-09-20 DIAGNOSIS — Z872 Personal history of diseases of the skin and subcutaneous tissue: Secondary | ICD-10-CM | POA: Insufficient documentation

## 2015-09-20 DIAGNOSIS — S3210XA Unspecified fracture of sacrum, initial encounter for closed fracture: Secondary | ICD-10-CM

## 2015-09-20 DIAGNOSIS — S3992XA Unspecified injury of lower back, initial encounter: Secondary | ICD-10-CM | POA: Diagnosis present

## 2015-09-20 MED ORDER — KETOROLAC TROMETHAMINE 60 MG/2ML IM SOLN
60.0000 mg | Freq: Once | INTRAMUSCULAR | Status: AC
  Administered 2015-09-20: 60 mg via INTRAMUSCULAR
  Filled 2015-09-20: qty 2

## 2015-09-20 MED ORDER — OXYCODONE HCL 5 MG PO TABS: 5.0000 mg | ORAL_TABLET | ORAL | Status: DC | PRN

## 2015-09-20 MED ORDER — OXYCODONE HCL 5 MG PO TABS
5.0000 mg | ORAL_TABLET | Freq: Once | ORAL | Status: AC
  Administered 2015-09-20: 5 mg via ORAL
  Filled 2015-09-20: qty 1

## 2015-09-20 MED ORDER — ACETAMINOPHEN 500 MG PO TABS
1000.0000 mg | ORAL_TABLET | Freq: Once | ORAL | Status: AC
  Administered 2015-09-20: 1000 mg via ORAL
  Filled 2015-09-20: qty 2

## 2015-09-20 MED ORDER — MORPHINE SULFATE 15 MG PO TABS: 15.0000 mg | ORAL_TABLET | ORAL | Status: DC | PRN

## 2015-09-20 MED ORDER — DIAZEPAM 5 MG PO TABS
5.0000 mg | ORAL_TABLET | Freq: Once | ORAL | Status: AC
  Administered 2015-09-20: 5 mg via ORAL
  Filled 2015-09-20: qty 1

## 2015-09-20 NOTE — ED Notes (Signed)
Called for pt x 2 more.  Not found in lobby

## 2015-09-20 NOTE — ED Notes (Addendum)
With pain reassessment pt visualized laying on side messaging/texting on phone; moving arms freely without objective signs of pain e.g. grimacing as seen before with arm movement.

## 2015-09-20 NOTE — ED Notes (Signed)
Bed: [facility] Expected date: 09/20/15 Expected time: 4:55 PM Means of arrival: Ambulance Comments: fall

## 2015-09-20 NOTE — ED Notes (Signed)
Pt able to bear weight to bilateral legs; pivot and take a few steps to wheelchair.

## 2015-09-20 NOTE — ED Provider Notes (Signed)
CSN: [id number]     Arrival date & time 09/20/15  1653 History   First MD Initiated Contact with Patient 09/20/15 1709     Chief Complaint  Patient presents with  . Back Pain     (Consider location/radiation/quality/duration/timing/severity/associated sxs/prior Treatment) Patient is a 35 y.o. female presenting with back pain. The history is provided by the patient.  Back Pain Location:  Lumbar spine and sacro-iliac joint Quality:  Cramping Radiates to:  Does not radiate Pain severity:  Severe Onset quality:  Sudden Duration:  6 hours Timing:  Constant Progression:  Worsening Chronicity:  New Context: falling   Relieved by:  Nothing Worsened by:  Nothing tried Ineffective treatments:  None tried Associated symptoms: no chest pain, no dysuria, no fever and no headaches    35 yo F With a chief complaints of low back pain. Patient fell down about 6 steps today. She said she slipped because they're wet. Landed on her bottom. Denies any head injury loss of consciousness. Denies radiation of the pain. Sharp shooting worse with movement or palpation.  Past Medical History  Diagnosis Date  . Anxiety   . Depression   . HIV infection ([place])   . Asthma   . Abscess   . AIDS ([place]) 03/19/2015  . Leg lesion 03/19/2015  . Major depression, recurrent ([place]) 03/19/2015  . Homeless 03/19/2015  . 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 Skin ulcer ([place]) 05/06/2015   Past Surgical History  Procedure Laterality Date  . Cesarean section     Family History  Problem Relation Age of Onset  . Hypertension Other   . Cancer Other   . Diabetes Other   . Asthma Other   . Throat cancer Mother   . Throat cancer Father    Social History  Substance Use Topics  . Smoking status: Current Every Day Smoker -- 0.30 packs/day for 3 years    Types: Cigarettes  . Smokeless tobacco: Never Used  . Alcohol Use: 0.0 oz/week    0 Standard drinks or equivalent per week     Comment: 5-6 drinks a month.    OB  History    No data available     Review of Systems  Constitutional: Negative for fever and chills.  HENT: Negative for congestion and rhinorrhea.   Eyes: Negative for redness and visual disturbance.  Respiratory: Negative for shortness of breath and wheezing.   Cardiovascular: Negative for chest pain and palpitations.  Gastrointestinal: Negative for nausea and vomiting.  Genitourinary: Negative for dysuria and urgency.  Musculoskeletal: Positive for myalgias, back pain, arthralgias and gait problem.  Skin: Negative for pallor and wound.  Neurological: Negative for dizziness and headaches.      Allergies  Bee venom and Morphine and related  Home Medications   Prior to Admission medications   Medication Sig Start Date End Date Taking? Authorizing Provider  albuterol (PROVENTIL HFA;VENTOLIN HFA) 108 (90 BASE) MCG/ACT inhaler Inhale 2 puffs into the lungs every 6 (six) hours as needed for wheezing or shortness of breath. 05/06/15  Yes Truman Hayward, MD  dolutegravir (TIVICAY) 50 MG tablet Take 1 tablet (50 mg total) by mouth daily. 03/19/15  Yes Truman Hayward, MD  dronabinol (MARINOL) 5 MG capsule Take 1 capsule (5 mg total) by mouth 2 (two) times daily before a meal. 03/25/15  Yes Truman Hayward, MD  emtricitabine-tenofovir AF (DESCOVY) 200-25 MG tablet Take 1 tablet by mouth daily. 03/19/15  Yes Roderic Scarce  Darrelyn Hillock, MD  hydrocerin (EUCERIN) CREA Apply 1 application topically 2 (two) times daily. 03/05/15  Yes Michel Bickers, MD  ibuprofen (ADVIL,MOTRIN) 200 MG tablet Take 200 mg by mouth every 6 (six) hours as needed for moderate pain.   Yes Historical Provider, MD  Neomycin-Bacitracin-Polymyxin (TRIPLE ANTIBIOTIC) [phone] OINT Apply 1 application topically 3 (three) times daily as needed (dry, itchy skin).   Yes Historical Provider, MD  acetaminophen (TYLENOL) 650 MG CR tablet Take 650 mg by mouth every 8 (eight) hours as needed for pain.    Historical Provider, MD   amoxicillin-clavulanate (AUGMENTIN) 875-125 MG tablet Take 1 tablet by mouth every 12 (twelve) hours. Patient not taking: Reported on 09/20/2015 06/30/15   Ottie Glazier, PA-C  megestrol (MEGACE ES) 625 MG/5ML suspension Take 5 mLs (625 mg total) by mouth daily. Patient not taking: Reported on 05/06/2015 04/04/15   Truman Hayward, MD  meloxicam (MOBIC) 7.5 MG tablet Take 1 tablet (7.5 mg total) by mouth daily as needed (moderate discomfort at affected breast). Patient not taking: Reported on 05/02/2015 08/16/14   [facility] Mikhail, DO  morphine (MSIR) 15 MG tablet Take 1 tablet (15 mg total) by mouth every 4 (four) hours as needed for severe pain. 09/20/15   Deno Etienne, DO  oxyCODONE (ROXICODONE) 5 MG immediate release tablet Take 1 tablet (5 mg total) by mouth every 4 (four) hours as needed for severe pain. 09/20/15   Deno Etienne, DO  predniSONE (DELTASONE) 20 MG tablet 40mg  daily x 7 days then 20mg  daily x 7 days Patient not taking: Reported on 09/20/2015 05/06/15   Truman Hayward, MD   BP 112/80 mmHg  Pulse 66  Temp(Src) 97.3 F (36.3 C) (Oral)  Resp 18  SpO2 100%  LMP 09/15/2015 Physical Exam  Constitutional: She is oriented to person, place, and time. She appears well-developed and well-nourished. No distress.  HENT:  Head: Normocephalic and atraumatic.  Eyes: EOM are normal. Pupils are equal, round, and reactive to light.  Neck: Normal range of motion. Neck supple.  Cardiovascular: Normal rate and regular rhythm.  Exam reveals no gallop and no friction rub.   No murmur heard. Pulmonary/Chest: Effort normal. She has no wheezes. She has no rales.  Abdominal: Soft. She exhibits no distension. There is no tenderness.  Musculoskeletal: She exhibits tenderness (TTP about the coccyx). She exhibits no edema.  PMS intact distally  Neurological: She is alert and oriented to person, place, and time.  Skin: Skin is warm and dry. She is not diaphoretic.  Psychiatric: She has a normal  mood and affect. Her behavior is normal.  Nursing note and vitals reviewed.   ED Course  Procedures (including critical care time) Labs Review Labs Reviewed - No data to display  Imaging Review Dg Lumbar Spine Complete  09/20/2015  CLINICAL DATA:  Acute low back pain after fall down steps today. EXAM: LUMBAR SPINE - COMPLETE 4+ VIEW COMPARISON:  None. FINDINGS: There is no evidence of lumbar spine fracture. Alignment is normal. Intervertebral disc spaces are maintained. IMPRESSION: Normal lumbar spine. Electronically Signed   By: Marijo Conception, M.D.   On: 09/20/2015 18:28   Dg Sacrum/coccyx  09/20/2015  CLINICAL DATA:  Low back pain after fall. EXAM: SACRUM AND COCCYX - 2+ VIEW COMPARISON:  None. FINDINGS: There is a fracture at the sacrococcygeal junction with 3 mm anterior displacement of the coccygeal fracture fragment. No additional fracture. No suspicious focal osseous lesion. IMPRESSION: Mildly displaced fracture at  the sacrococcygeal junction. Electronically Signed   By: Ilona Sorrel M.D.   On: 09/20/2015 18:27   I have personally reviewed and evaluated these images and lab results as part of my medical decision-making.   EKG Interpretation None      MDM   Final diagnoses:  Sacrum and coccyx fracture, closed, initial encounter [facility])    35 yo F with low back pain. Found to have a fractured tailbone. Will have the patient follow-up with her family doctor.  7:23 PM:  I have discussed the diagnosis/risks/treatment options with the patient and believe the pt to be eligible for discharge home to follow-up with PCP. We also discussed returning to the ED immediately if new or worsening sx occur. We discussed the sx which are most concerning (e.g., sudden worsening pain, fever, inability to tolerate by mouth, cauda equina) that necessitate immediate return. Medications administered to the patient during their visit and any new prescriptions provided to the patient are listed  below.  Medications given during this visit Medications  acetaminophen (TYLENOL) tablet 1,000 mg (1,000 mg Oral Given 09/20/15 1726)  oxyCODONE (Oxy IR/ROXICODONE) immediate release tablet 5 mg (5 mg Oral Given 09/20/15 1726)  ketorolac (TORADOL) injection 60 mg (60 mg Intramuscular Given 09/20/15 1726)  diazepam (VALIUM) tablet 5 mg (5 mg Oral Given 09/20/15 1831)    Discharge Medication List as of 09/20/2015  6:51 PM    START taking these medications   Details  morphine (MSIR) 15 MG tablet Take 1 tablet (15 mg total) by mouth every 4 (four) hours as needed for severe pain., Starting 09/20/2015, Until Discontinued, Print    oxyCODONE (ROXICODONE) 5 MG immediate release tablet Take 1 tablet (5 mg total) by mouth every 4 (four) hours as needed for severe pain., Starting 09/20/2015, Until Discontinued, Print        The patient appears reasonably screen and/or stabilized for discharge and I doubt any other medical condition or other [facility] requiring further screening, evaluation, or treatment in the ED at this time prior to discharge.       Deno Etienne, DO 09/20/15 [phone]

## 2015-09-20 NOTE — ED Notes (Signed)
Per EMS pt complaint of lower back pain post fall down 6 stairs 0700 today; only position comfortable prone. Pt reports left AMA related to wait time earlier today.

## 2015-09-20 NOTE — ED Notes (Signed)
When giving pt medication intervention reports excruciating pain with movement of arms.

## 2015-09-20 NOTE — ED Notes (Signed)
Pt not found in lobby 

## 2015-09-20 NOTE — Discharge Instructions (Signed)
Take 4 over the counter ibuprofen tablets 3 times a day or 2 over-the-counter naproxen tablets twice a day for pain. Tylenol 1-2 tabs po q4h prn

## 2015-09-26 ENCOUNTER — Emergency Department (HOSPITAL_COMMUNITY)
Admission: EM | Admit: 2015-09-26 | Discharge: 2015-09-26 | Disposition: A | Discharge status: Home / Self Care | Payer: Medicaid Other | Attending: Emergency Medicine | Admitting: Emergency Medicine

## 2015-09-26 ENCOUNTER — Encounter (HOSPITAL_COMMUNITY): Payer: Self-pay | Admitting: Emergency Medicine

## 2015-09-26 DIAGNOSIS — Z791 Long term (current) use of non-steroidal anti-inflammatories (NSAID): Secondary | ICD-10-CM | POA: Diagnosis not present

## 2015-09-26 DIAGNOSIS — M533 Sacrococcygeal disorders, not elsewhere classified: Secondary | ICD-10-CM

## 2015-09-26 DIAGNOSIS — F17210 Nicotine dependence, cigarettes, uncomplicated: Secondary | ICD-10-CM | POA: Diagnosis not present

## 2015-09-26 DIAGNOSIS — R110 Nausea: Secondary | ICD-10-CM

## 2015-09-26 DIAGNOSIS — J45909 Unspecified asthma, uncomplicated: Secondary | ICD-10-CM | POA: Insufficient documentation

## 2015-09-26 DIAGNOSIS — Z79899 Other long term (current) drug therapy: Secondary | ICD-10-CM | POA: Insufficient documentation

## 2015-09-26 DIAGNOSIS — F419 Anxiety disorder, unspecified: Secondary | ICD-10-CM | POA: Insufficient documentation

## 2015-09-26 DIAGNOSIS — R112 Nausea with vomiting, unspecified: Secondary | ICD-10-CM | POA: Diagnosis not present

## 2015-09-26 DIAGNOSIS — Z590 Homelessness: Secondary | ICD-10-CM | POA: Insufficient documentation

## 2015-09-26 DIAGNOSIS — F339 Major depressive disorder, recurrent, unspecified: Secondary | ICD-10-CM | POA: Diagnosis not present

## 2015-09-26 DIAGNOSIS — B20 Human immunodeficiency virus [HIV] disease: Secondary | ICD-10-CM | POA: Diagnosis not present

## 2015-09-26 DIAGNOSIS — Z872 Personal history of diseases of the skin and subcutaneous tissue: Secondary | ICD-10-CM | POA: Diagnosis not present

## 2015-09-26 LAB — COMPREHENSIVE METABOLIC PANEL
ALBUMIN: 2.9 g/dL — AB (ref 3.5–5.0)
ALK PHOS: 86 U/L (ref 38–126)
ALT: 13 U/L — ABNORMAL LOW (ref 14–54)
ANION GAP: 7 (ref 5–15)
AST: 20 U/L (ref 15–41)
BILIRUBIN TOTAL: 0.8 mg/dL (ref 0.3–1.2)
BUN: 20 mg/dL (ref 6–20)
CALCIUM: 8.5 mg/dL — AB (ref 8.9–10.3)
CO2: 24 mmol/L (ref 22–32)
Chloride: 111 mmol/L (ref 101–111)
Creatinine, Ser: 0.66 mg/dL (ref 0.44–1.00)
GFR calc non Af Amer: >60 mL/min (ref >60)
GLUCOSE: 91 mg/dL (ref 65–99)
POTASSIUM: 3.5 mmol/L (ref 3.5–5.1)
SODIUM: 142 mmol/L (ref 135–145)
TOTAL PROTEIN: 7.8 g/dL (ref 6.5–8.1)

## 2015-09-26 LAB — CBC
HEMATOCRIT: 32.4 % — AB (ref 36.0–46.0)
HEMOGLOBIN: 9.5 g/dL — AB (ref 12.0–15.0)
MCH: 21.7 pg — ABNORMAL LOW (ref 26.0–34.0)
MCHC: 29.3 g/dL — AB (ref 30.0–36.0)
MCV: 74 fL — ABNORMAL LOW (ref 78.0–100.0)
Platelets: 138 10*3/uL — ABNORMAL LOW (ref 150–400)
RBC: 4.38 MIL/uL (ref 3.87–5.11)
RDW: 20.9 % — ABNORMAL HIGH (ref 11.5–15.5)
WBC: 2 10*3/uL — ABNORMAL LOW (ref 4.0–10.5)

## 2015-09-26 LAB — LIPASE, BLOOD: Lipase: 22 U/L (ref 11–51)

## 2015-09-26 MED ORDER — OXYCODONE-ACETAMINOPHEN 5-325 MG PO TABS
ORAL_TABLET | ORAL | Status: AC
  Filled 2015-09-26: qty 1

## 2015-09-26 MED ORDER — ONDANSETRON HCL 4 MG PO TABS: 4.0000 mg | ORAL_TABLET | Freq: Three times a day (TID) | ORAL | Status: DC | PRN

## 2015-09-26 MED ORDER — OXYCODONE-ACETAMINOPHEN 5-325 MG PO TABS
1.0000 | ORAL_TABLET | ORAL | Status: DC | PRN
  Administered 2015-09-26: 1 via ORAL

## 2015-09-26 MED ORDER — DICLOFENAC SODIUM 50 MG PO TBEC: 50.0000 mg | DELAYED_RELEASE_TABLET | Freq: Two times a day (BID) | ORAL | Status: DC

## 2015-09-26 MED ORDER — TRAMADOL HCL 50 MG PO TABS: 50.0000 mg | ORAL_TABLET | Freq: Four times a day (QID) | ORAL | Status: DC | PRN

## 2015-09-26 NOTE — ED Notes (Signed)
Pt reports that she broke her tailbone 1 week ago and started having increased pain since running out of medicines. Pt also reports nausea with vomiting. Pt alert x4. NAD at this time.

## 2015-09-26 NOTE — Discharge Instructions (Signed)
Follow up with the doctor you were referred to. Do not drive while taking the narcotic as it will make you sleepy.

## 2015-09-26 NOTE — ED Notes (Signed)
Pt reports she fell down several stairs on Friday. States she injured coccyx bone. Now reports increased pain.

## 2015-09-26 NOTE — ED Notes (Signed)
Pt advised about pain medication policy.

## 2015-09-26 NOTE — ED Provider Notes (Signed)
CSN: [id number]     Arrival date & time 09/26/15  1144 History  By signing my name below, I, Beverly [name], attest that this documentation has been prepared under the direction and in the presence of Beverly Baller, NP. Electronically Signed: Eustaquio [name], ED Scribe. 09/26/2015. 2:54 PM.   Chief Complaint  Patient presents with  . Tailbone Pain  . Nausea   The history is provided by the patient. No language interpreter was used.    HPI Comments: Beverly [name] is a 35 y.o. female who presents to the Emergency Department complaining of sudden onset, constant, tailbone pain radiating to the middle of her back that began six days ago after she fell down 6 stairs. Pt was seen at [facility] ED on 3/31 (approximatley 1 week ago) for this pain and diagnosed with coccyx fracture. Pt was given 7 tablets of Oxycodone on discharge which she ran out of yesterday. Pt has been taking Advil without relief. Associated symptoms are nausea and vomiting with 2 episodes this morning.  She was able to eat a little food this morning without vomiting. Pt has called her PCP for follow up twice and is awaiting a call back. Denies any other associated symptoms.   Past Medical History  Diagnosis Date  . Anxiety   . Depression   . HIV infection ([place])   . Asthma   . Abscess   . AIDS ([place]) 03/19/2015  . Leg lesion 03/19/2015  . Major depression, recurrent ([place]) 03/19/2015  . Homeless 03/19/2015  . 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 Skin ulcer ([place]) 05/06/2015   Past Surgical History  Procedure Laterality Date  . Cesarean section     Family History  Problem Relation Age of Onset  . Hypertension Other   . Cancer Other   . Diabetes Other   . Asthma Other   . Throat cancer Mother   . Throat cancer Father    Social History  Substance Use Topics  . Smoking status: Current Every Day Smoker -- 0.30 packs/day for 3 years    Types: Cigarettes  . Smokeless tobacco: Never Used  . Alcohol Use: 0.0 oz/week    0 Standard  drinks or equivalent per week     Comment: 5-6 drinks a month.    OB History    No data available     Review of Systems  Gastrointestinal: Positive for nausea and vomiting.  Musculoskeletal:       + Tailbone pain  All other systems reviewed and are negative.   Allergies  Bee venom and Morphine and related  Home Medications   Prior to Admission medications   Medication Sig Start Date End Date Taking? Authorizing Provider  acetaminophen (TYLENOL) 650 MG CR tablet Take 650 mg by mouth every 8 (eight) hours as needed for pain.    Historical Provider, MD  albuterol (PROVENTIL HFA;VENTOLIN HFA) 108 (90 BASE) MCG/ACT inhaler Inhale 2 puffs into the lungs every 6 (six) hours as needed for wheezing or shortness of breath. 05/06/15   Truman Hayward, MD  diclofenac (VOLTAREN) 50 MG EC tablet Take 1 tablet (50 mg total) by mouth 2 (two) times daily. 09/26/15   Amiah Frohlich Bunnie Pion, NP  dolutegravir (TIVICAY) 50 MG tablet Take 1 tablet (50 mg total) by mouth daily. 03/19/15   Truman Hayward, MD  dronabinol (MARINOL) 5 MG capsule Take 1 capsule (5 mg total) by mouth 2 (two) times daily before a meal. 03/25/15   Roderic Scarce  Darrelyn Hillock, MD  emtricitabine-tenofovir AF (DESCOVY) 200-25 MG tablet Take 1 tablet by mouth daily. 03/19/15   Truman Hayward, MD  hydrocerin (EUCERIN) CREA Apply 1 application topically 2 (two) times daily. 03/05/15   Michel Bickers, MD  morphine (MSIR) 15 MG tablet Take 1 tablet (15 mg total) by mouth every 4 (four) hours as needed for severe pain. 09/20/15   Deno Etienne, DO  Neomycin-Bacitracin-Polymyxin (TRIPLE ANTIBIOTIC) [phone] OINT Apply 1 application topically 3 (three) times daily as needed (dry, itchy skin).    Historical Provider, MD  ondansetron (ZOFRAN) 4 MG tablet Take 1 tablet (4 mg total) by mouth every 8 (eight) hours as needed for nausea or vomiting. 09/26/15   Anurag Scarfo Bunnie Pion, NP  oxyCODONE (ROXICODONE) 5 MG immediate release tablet Take 1 tablet (5 mg total) by  mouth every 4 (four) hours as needed for severe pain. 09/20/15   Deno Etienne, DO  traMADol (ULTRAM) 50 MG tablet Take 1 tablet (50 mg total) by mouth every 6 (six) hours as needed. 09/26/15   Maansi Wike Bunnie Pion, NP   BP 112/82 mmHg  Pulse 70  Temp(Src) 98.2 F (36.8 C) (Oral)  Resp 18  SpO2 99%  LMP 09/15/2015 Physical Exam  Constitutional: She is oriented to person, place, and time. She appears well-developed and well-nourished. No distress.  HENT:  Head: Normocephalic and atraumatic.  Eyes: Conjunctivae and EOM are normal.  Neck: Neck supple. No tracheal deviation present.  Cardiovascular: Normal rate, regular rhythm and normal heart sounds.   Pulmonary/Chest: Effort normal and breath sounds normal. No respiratory distress. She has no wheezes. She has no rales.  Abdominal: Soft. Bowel sounds are normal. There is no tenderness. There is no rebound, no guarding and no CVA tenderness.  Musculoskeletal: Normal range of motion. She exhibits tenderness.  Tenderness to palpation over the coccyx area.   Neurological: She is alert and oriented to person, place, and time.  Skin: Skin is warm and dry.  Psychiatric: She has a normal mood and affect. Her behavior is normal.  Nursing note and vitals reviewed.   ED Course  Procedures (including critical care time)   DIAGNOSTIC STUDIES: Oxygen Saturation is 100% on RA, normal by my interpretation.    COORDINATION OF CARE: 2:49 PM-Discussed treatment plan which includes Rx Voltaren, Ultram, and Zofran with pt at bedside and pt agreed to plan.    Labs Review Labs Reviewed  COMPREHENSIVE METABOLIC PANEL - Abnormal; Notable for the following:    Calcium 8.5 (*)    Albumin 2.9 (*)    ALT 13 (*)    All other components within normal limits  CBC - Abnormal; Notable for the following:    WBC 2.0 (*)    Hemoglobin 9.5 (*)    HCT 32.4 (*)    MCV 74.0 (*)    MCH 21.7 (*)    MCHC 29.3 (*)    RDW 20.9 (*)    Platelets 138 (*)    All other components  within normal limits  LIPASE, BLOOD    MDM  35 y.o. female with coccyx pain s/p fracture one week ago. Pain sometimes get bad and she has nausea. She is here for pain management until she can follow up with the referral she was given on her previous visit. Rx Tramadol. Discussed with the patient and all questioned fully answered. She will call return if any problems arise.  Final diagnoses:  Nausea  Coccyx pain   I personally performed the  services described in this documentation, which was scribed in my presence. The recorded information has been reviewed and is accurate.      [address] [place], NP 09/27/15 [place], MD 09/27/15 [phone]

## 2015-11-01 ENCOUNTER — Encounter (HOSPITAL_COMMUNITY): Payer: Self-pay

## 2015-11-01 ENCOUNTER — Emergency Department (HOSPITAL_COMMUNITY)
Admission: EM | Admit: 2015-11-01 | Discharge: 2015-11-01 | Disposition: A | Discharge status: Home / Self Care | Payer: Medicaid Other | Attending: Emergency Medicine | Admitting: Emergency Medicine

## 2015-11-01 DIAGNOSIS — Z872 Personal history of diseases of the skin and subcutaneous tissue: Secondary | ICD-10-CM | POA: Diagnosis not present

## 2015-11-01 DIAGNOSIS — H5713 Ocular pain, bilateral: Secondary | ICD-10-CM | POA: Diagnosis not present

## 2015-11-01 DIAGNOSIS — F419 Anxiety disorder, unspecified: Secondary | ICD-10-CM | POA: Diagnosis not present

## 2015-11-01 DIAGNOSIS — H578 Other specified disorders of eye and adnexa: Secondary | ICD-10-CM | POA: Insufficient documentation

## 2015-11-01 DIAGNOSIS — Z590 Homelessness: Secondary | ICD-10-CM | POA: Diagnosis not present

## 2015-11-01 DIAGNOSIS — H53143 Visual discomfort, bilateral: Secondary | ICD-10-CM | POA: Diagnosis not present

## 2015-11-01 DIAGNOSIS — Z791 Long term (current) use of non-steroidal anti-inflammatories (NSAID): Secondary | ICD-10-CM | POA: Diagnosis not present

## 2015-11-01 DIAGNOSIS — F17210 Nicotine dependence, cigarettes, uncomplicated: Secondary | ICD-10-CM | POA: Insufficient documentation

## 2015-11-01 DIAGNOSIS — J45909 Unspecified asthma, uncomplicated: Secondary | ICD-10-CM | POA: Insufficient documentation

## 2015-11-01 DIAGNOSIS — F329 Major depressive disorder, single episode, unspecified: Secondary | ICD-10-CM | POA: Diagnosis not present

## 2015-11-01 DIAGNOSIS — Z79899 Other long term (current) drug therapy: Secondary | ICD-10-CM | POA: Insufficient documentation

## 2015-11-01 DIAGNOSIS — B20 Human immunodeficiency virus [HIV] disease: Secondary | ICD-10-CM | POA: Insufficient documentation

## 2015-11-01 MED ORDER — CETIRIZINE HCL 1 MG/ML PO SYRP: 5.0000 mg | ORAL_SOLUTION | Freq: Two times a day (BID) | ORAL | Status: DC

## 2015-11-01 MED ORDER — POLYMYXIN B-TRIMETHOPRIM 10000-0.1 UNIT/ML-% OP SOLN
1.0000 [drp] | OPHTHALMIC | Status: DC
  Administered 2015-11-01: 1 [drp] via OPHTHALMIC
  Filled 2015-11-01: qty 10

## 2015-11-01 MED ORDER — TETRACAINE HCL 0.5 % OP SOLN
2.0000 [drp] | Freq: Once | OPHTHALMIC | Status: AC
  Administered 2015-11-01: 2 [drp] via OPHTHALMIC
  Filled 2015-11-01: qty 4

## 2015-11-01 MED ORDER — FLUORESCEIN SODIUM 1 MG OP STRP
1.0000 | ORAL_STRIP | Freq: Once | OPHTHALMIC | Status: AC
  Administered 2015-11-01: 1 via OPHTHALMIC
  Filled 2015-11-01: qty 1

## 2015-11-01 NOTE — ED Provider Notes (Signed)
CSN: [id number]     Arrival date & time 11/01/15  1443 History   First MD Initiated Contact with Patient 11/01/15 1517     Chief Complaint  Patient presents with  . Eye Pain     (Consider location/radiation/quality/duration/timing/severity/associated sxs/prior Treatment) HPI   35 year old female with history of HIV, anxiety, depression, neuropathy presenting with eye pain. Patient reports gradual onset of bilateral eye pain that is ongoing since last night. She woke up this morning with eyes matted shut, itchy, sharp and throbbing and rated her pain as 10 out of 10 including light sensitivity. She reports excessive teary-eyed. She denies any specific injury or chemical exposure. She has one similar episode in the past and was diagnosed with conjunctivitis. She denies having any fever, headache, diplopia, runny nose sneezing coughing or any recent trauma. She does not wear eye contact lens.  Past Medical History  Diagnosis Date  . Anxiety   . Depression   . HIV infection ([place])   . Asthma   . Abscess   . AIDS ([place]) 03/19/2015  . Leg lesion 03/19/2015  . Major depression, recurrent ([place]) 03/19/2015  . Homeless 03/19/2015  . 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 Skin ulcer ([place]) 05/06/2015   Past Surgical History  Procedure Laterality Date  . Cesarean section     Family History  Problem Relation Age of Onset  . Hypertension Other   . Cancer Other   . Diabetes Other   . Asthma Other   . Throat cancer Mother   . Throat cancer Father    Social History  Substance Use Topics  . Smoking status: Current Every Day Smoker -- 0.30 packs/day for 3 years    Types: Cigarettes  . Smokeless tobacco: Never Used  . Alcohol Use: 0.0 oz/week    0 Standard drinks or equivalent per week     Comment: 5-6 drinks a month.    OB History    No data available     Review of Systems  Constitutional: Negative for fever.  Eyes: Positive for photophobia, pain and itching.      Allergies  Bee  venom and Morphine and related  Home Medications   Prior to Admission medications   Medication Sig Start Date End Date Taking? Authorizing Provider  acetaminophen (TYLENOL) 650 MG CR tablet Take 650 mg by mouth every 8 (eight) hours as needed for pain.    Historical Provider, MD  albuterol (PROVENTIL HFA;VENTOLIN HFA) 108 (90 BASE) MCG/ACT inhaler Inhale 2 puffs into the lungs every 6 (six) hours as needed for wheezing or shortness of breath. 05/06/15   Truman Hayward, MD  diclofenac (VOLTAREN) 50 MG EC tablet Take 1 tablet (50 mg total) by mouth 2 (two) times daily. 09/26/15   Hope Bunnie Pion, NP  dolutegravir (TIVICAY) 50 MG tablet Take 1 tablet (50 mg total) by mouth daily. 03/19/15   Truman Hayward, MD  dronabinol (MARINOL) 5 MG capsule Take 1 capsule (5 mg total) by mouth 2 (two) times daily before a meal. 03/25/15   Truman Hayward, MD  emtricitabine-tenofovir AF (DESCOVY) 200-25 MG tablet Take 1 tablet by mouth daily. 03/19/15   Truman Hayward, MD  hydrocerin (EUCERIN) CREA Apply 1 application topically 2 (two) times daily. 03/05/15   Michel Bickers, MD  morphine (MSIR) 15 MG tablet Take 1 tablet (15 mg total) by mouth every 4 (four) hours as needed for severe pain. 09/20/15   Deno Etienne, DO  Neomycin-Bacitracin-Polymyxin (TRIPLE ANTIBIOTIC) [phone] OINT Apply 1 application topically 3 (three) times daily as needed (dry, itchy skin).    Historical Provider, MD  ondansetron (ZOFRAN) 4 MG tablet Take 1 tablet (4 mg total) by mouth every 8 (eight) hours as needed for nausea or vomiting. 09/26/15   Hope Bunnie Pion, NP  oxyCODONE (ROXICODONE) 5 MG immediate release tablet Take 1 tablet (5 mg total) by mouth every 4 (four) hours as needed for severe pain. 09/20/15   Deno Etienne, DO  traMADol (ULTRAM) 50 MG tablet Take 1 tablet (50 mg total) by mouth every 6 (six) hours as needed. 09/26/15   Hope Bunnie Pion, NP   BP 135/89 mmHg  Pulse 76  Temp(Src) 98 F (36.7 C) (Oral)  Resp 18  Ht 5\' 1"   (1.549 m)  Wt 69.4 kg  BMI 28.92 kg/m2  SpO2 100%  LMP 10/13/2015 (Approximate) Physical Exam  Constitutional: She appears well-developed and well-nourished. No distress.  African-American female, laying in bed in a dark room appears uncomfortable.  HENT:  Head: Atraumatic.  Eyes: Conjunctivae, EOM and lids are normal. Pupils are equal, round, and reactive to light. Lids are everted and swept, no foreign bodies found. Right eye exhibits no chemosis, no discharge, no exudate and no hordeolum. No foreign body present in the right eye. Left eye exhibits no chemosis, no discharge, no exudate and no hordeolum. No foreign body present in the left eye. Right conjunctiva is not injected. Right conjunctiva has no hemorrhage. Left conjunctiva is not injected. Left conjunctiva has no hemorrhage. No scleral icterus.  Slit lamp exam:      The right eye shows no corneal abrasion, no corneal flare, no corneal ulcer, no foreign body, no hyphema, no hypopyon and no fluorescein uptake.       The left eye shows no corneal abrasion, no corneal flare, no corneal ulcer, no foreign body, no hyphema, no hypopyon and no fluorescein uptake.  Excessive tearing.   Visual Acuity   Bilateral Distance  20/70        R Distance  20/100         L Distance  20/100   IOP: R eye 19, L eye 18            Neck: Normal range of motion. Neck supple.  No nuchal rigidity  Neurological: She is alert.  Skin: No rash noted.  Psychiatric: She has a normal mood and affect.  Nursing note and vitals reviewed.   ED Course  Procedures (including critical care time)   MDM   Final diagnoses:  Eye pain, bilateral    BP 135/89 mmHg  Pulse 76  Temp(Src) 98 F (36.7 C) (Oral)  Resp 18  Ht 5\' 1"  (1.549 m)  Wt 69.4 kg  BMI 28.92 kg/m2  SpO2 100%  LMP 10/13/2015 (Approximate)   4:54 PM Patient presents complaining of bilateral eye pain and itchiness with crust that started yesterday. Visual acuity of vision is 20/100  bilaterally. No visual field cut on exam. Extraocular movements intact, pupils equal round reactive to light. She has normal intraocular pressure. No  fluorescen uptake or corneal flair.  No severe headache.  Eye pain improves after pt received tetracain drops.  Low suspicion for acute angle glaucoma, uveitis or other ocular emergency.  Will provide polytrim and encourage pt to f/u promptly with eye specialist for further management.    Domenic Moras, PA-C 11/01/15 1726  Gareth Morgan, MD 11/04/15 [phone]

## 2015-11-01 NOTE — Progress Notes (Signed)
Patient noted to have been seen in the ED 7 times within the last six months.  Patient noted to have Medicaid insurance without a pcp.  EDCM went to speak to patient at bedside, however, EDPA at bedside currently.  EDCM left list of pcps who accept Medicaid insurance in [place] at bedside.  Also provided patient with phone number to DSS and Medicaid transportation.  No further EDCM needs at this time.

## 2015-11-01 NOTE — Discharge Instructions (Signed)
Please take zyrtec twice daily.  Apply 1 drop of polytrim to each eye every 6 hrs for 7 days and follow up with eye specialist for further evaluation of your eye pain.   Allergic Conjunctivitis Allergic conjunctivitis is inflammation of the clear membrane that covers the white part of your eye and the inner surface of your eyelid (conjunctiva), and it is caused by allergies. The blood vessels in the conjunctiva become inflamed, and this causes the eye to become red or pink, and it often causes itchiness in the eye. Allergic conjunctivitis cannot be spread by one person to another person (noncontagious). CAUSES This condition is caused by an allergic reaction. Common causes of an allergic reaction (allergens) include:  Dust.  Pollen.  Mold.  Animal dander or secretions. RISK FACTORS This condition is more likely to develop if you are exposed to high levels of allergens that cause the allergic reaction. This might include being outdoors when air pollen levels are high or being around animals that you are allergic to. SYMPTOMS Symptoms of this condition may include:  Eye redness.  Tearing of the eyes.  Watery eyes.  Itchy eyes.  Burning feeling in the eyes.  Clear drainage from the eyes.  Swollen eyelids. DIAGNOSIS This condition may be diagnosed by medical history and physical exam. If you have drainage from your eyes, it may be tested to rule out other causes of conjunctivitis. TREATMENT Treatment for this condition often includes medicines. These may be eye drops, ointments, or oral medicines. They may be prescription medicines or over-the-counter medicines. HOME CARE INSTRUCTIONS  Take or apply medicines only as directed by your health care provider.  Do not touch or rub your eyes.  Do not wear contact lenses until the inflammation is gone. Wear glasses instead.  Do not wear eye makeup until the inflammation is gone.  Apply a cool, clean washcloth to your eye for 10-20  minutes, 3-4 times a day.  Try to avoid whatever allergen is causing the allergic reaction. SEEK MEDICAL CARE IF:  Your symptoms get worse.  You have pus draining from your eye.  You have new symptoms.  You have a fever.   This information is not intended to replace advice given to you by your health care provider. Make sure you discuss any questions you have with your health care provider.   Document Released: 08/29/2002 Document Revised: 06/29/2014 Document Reviewed: 03/20/2014 Elsevier Interactive Patient Education [employer].

## 2015-11-01 NOTE — ED Notes (Signed)
Pt with eye pain starting yesterday.  Blurred vision.  No eye injury or chemical exposure noted.

## 2015-11-08 ENCOUNTER — Telehealth: Payer: Self-pay | Admitting: *Deleted

## 2015-11-08 NOTE — Telephone Encounter (Signed)
RN received a referral for Beverly [name]. Patient has not been seen by our clinic and may benefit from assistance with removing barriers to care/medication adherence. After reviewing the chart, RN contacted the patient. Left a message stating my name and the reason for my call. On the message I also stated that I work for Dr Drucilla Schmidt and we noticed she has been seen in the ED each month over the last 3 months and we would love to see how she is doing. Left me number for the patient to return my call

## 2015-11-09 NOTE — Telephone Encounter (Signed)
THanks [name], we def need to get her back into care

## 2015-12-09 ENCOUNTER — Emergency Department (HOSPITAL_COMMUNITY): Payer: Medicaid Other

## 2015-12-09 ENCOUNTER — Emergency Department (HOSPITAL_COMMUNITY)
Admission: EM | Admit: 2015-12-09 | Discharge: 2015-12-09 | Disposition: A | Discharge status: Home / Self Care | Payer: Medicaid Other | Attending: Emergency Medicine | Admitting: Emergency Medicine

## 2015-12-09 ENCOUNTER — Encounter (HOSPITAL_COMMUNITY): Payer: Self-pay | Admitting: Emergency Medicine

## 2015-12-09 DIAGNOSIS — B20 Human immunodeficiency virus [HIV] disease: Secondary | ICD-10-CM | POA: Insufficient documentation

## 2015-12-09 DIAGNOSIS — Z79899 Other long term (current) drug therapy: Secondary | ICD-10-CM | POA: Insufficient documentation

## 2015-12-09 DIAGNOSIS — Z792 Long term (current) use of antibiotics: Secondary | ICD-10-CM | POA: Diagnosis not present

## 2015-12-09 DIAGNOSIS — H5713 Ocular pain, bilateral: Secondary | ICD-10-CM | POA: Diagnosis present

## 2015-12-09 DIAGNOSIS — F17210 Nicotine dependence, cigarettes, uncomplicated: Secondary | ICD-10-CM | POA: Insufficient documentation

## 2015-12-09 DIAGNOSIS — J45909 Unspecified asthma, uncomplicated: Secondary | ICD-10-CM | POA: Diagnosis not present

## 2015-12-09 DIAGNOSIS — Z791 Long term (current) use of non-steroidal anti-inflammatories (NSAID): Secondary | ICD-10-CM | POA: Diagnosis not present

## 2015-12-09 DIAGNOSIS — F329 Major depressive disorder, single episode, unspecified: Secondary | ICD-10-CM | POA: Diagnosis not present

## 2015-12-09 DIAGNOSIS — H539 Unspecified visual disturbance: Secondary | ICD-10-CM | POA: Diagnosis not present

## 2015-12-09 DIAGNOSIS — F1290 Cannabis use, unspecified, uncomplicated: Secondary | ICD-10-CM | POA: Insufficient documentation

## 2015-12-09 LAB — CBC WITH DIFFERENTIAL/PLATELET
BASOS PCT: 0 %
Basophils Absolute: 0 10*3/uL (ref 0.0–0.1)
EOS ABS: 0.2 10*3/uL (ref 0.0–0.7)
Eosinophils Relative: 9 %
HCT: 33.6 % — ABNORMAL LOW (ref 36.0–46.0)
Hemoglobin: 10.4 g/dL — ABNORMAL LOW (ref 12.0–15.0)
Lymphocytes Relative: 20 %
Lymphs Abs: 0.4 10*3/uL — ABNORMAL LOW (ref 0.7–4.0)
MCH: 23.2 pg — ABNORMAL LOW (ref 26.0–34.0)
MCHC: 31 g/dL (ref 30.0–36.0)
MCV: 75 fL — ABNORMAL LOW (ref 78.0–100.0)
Monocytes Absolute: 0.4 10*3/uL (ref 0.1–1.0)
Monocytes Relative: 19 %
NEUTROS PCT: 51 %
Neutro Abs: 1.1 10*3/uL — ABNORMAL LOW (ref 1.7–7.7)
PLATELETS: 118 10*3/uL — AB (ref 150–400)
RBC: 4.48 MIL/uL (ref 3.87–5.11)
RDW: 17.8 % — ABNORMAL HIGH (ref 11.5–15.5)
WBC: 2.2 10*3/uL — AB (ref 4.0–10.5)

## 2015-12-09 LAB — BASIC METABOLIC PANEL
ANION GAP: 5 (ref 5–15)
BUN: 9 mg/dL (ref 6–20)
CO2: 23 mmol/L (ref 22–32)
Calcium: 8.3 mg/dL — ABNORMAL LOW (ref 8.9–10.3)
Chloride: 109 mmol/L (ref 101–111)
Creatinine, Ser: 0.6 mg/dL (ref 0.44–1.00)
GFR calc Af Amer: >60 mL/min (ref >60)
Glucose, Bld: 89 mg/dL (ref 65–99)
POTASSIUM: 3.1 mmol/L — AB (ref 3.5–5.1)
SODIUM: 137 mmol/L (ref 135–145)

## 2015-12-09 MED ORDER — TETRACAINE HCL 0.5 % OP SOLN
1.0000 [drp] | Freq: Once | OPHTHALMIC | Status: AC
  Administered 2015-12-09: 1 [drp] via OPHTHALMIC
  Filled 2015-12-09: qty 4

## 2015-12-09 MED ORDER — FLUORESCEIN SODIUM 1 MG OP STRP
1.0000 | ORAL_STRIP | Freq: Once | OPHTHALMIC | Status: AC
  Administered 2015-12-09: 1 via OPHTHALMIC
  Filled 2015-12-09: qty 1

## 2015-12-09 NOTE — ED Notes (Signed)
PT FAILED VISUAL ACUITY TEST COULD SEE SHAPES BUT COULD NOT MAKE OUT WHAT THEY WERE.

## 2015-12-09 NOTE — ED Notes (Signed)
Contacted [facility] for pick up

## 2015-12-09 NOTE — Discharge Instructions (Signed)
As discussed, with her ongoing vision disturbance, bilateral eye pain it is important that you follow-up with our ophthalmologist.  The offices expecting a call, and will work with you to be seen expeditiously.

## 2015-12-09 NOTE — ED Provider Notes (Signed)
CSN: [id number]     Arrival date & time 12/09/15  [id number] History   First MD Initiated Contact with Patient 12/09/15 1010     Chief Complaint  Patient presents with  . Loss of Vision    HPI  Patient presents with vision loss, bilateral eye pain. Patient notes that eye symptoms have been present for about 2 weeks, initially with some bilateral irritation, then with visual disturbance, now over the past 24 hours she has been unable to see out of each eye, has substantial pain. This is present in spite of using recently prescribed eye drops. Patient acknowledges a history HIV, states that she only recently began taking her medication again consistently. She denies new fever, nausea, vomiting, other new changes. Eye pain is worse with light exposure, motion.  Past Medical History  Diagnosis Date  . Anxiety   . Depression   . HIV infection ([place])   . Asthma   . Abscess   . AIDS ([place]) 03/19/2015  . Leg lesion 03/19/2015  . Major depression, recurrent ([place]) 03/19/2015  . Homeless 03/19/2015  . 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 Skin ulcer ([place]) 05/06/2015   Past Surgical History  Procedure Laterality Date  . Cesarean section     Family History  Problem Relation Age of Onset  . Hypertension Other   . Cancer Other   . Diabetes Other   . Asthma Other   . Throat cancer Mother   . Throat cancer Father    Social History  Substance Use Topics  . Smoking status: Current Every Day Smoker -- 0.30 packs/day for 3 years    Types: Cigarettes  . Smokeless tobacco: Never Used  . Alcohol Use: 0.0 oz/week    0 Standard drinks or equivalent per week     Comment: 5-6 drinks a month.    OB History    No data available     Review of Systems  Constitutional:       Per HPI, otherwise negative  HENT:       Per HPI, otherwise negative  Eyes: Positive for photophobia, pain, discharge, redness and visual disturbance.  Respiratory:       Per HPI, otherwise negative  Cardiovascular:        Per HPI, otherwise negative  Gastrointestinal: Negative for vomiting.  Endocrine:       Negative aside from HPI  Genitourinary:       Neg aside from HPI   Musculoskeletal:       Per HPI, otherwise negative  Skin: Negative.   Allergic/Immunologic: Positive for immunocompromised state.  Neurological: Negative for syncope.      Allergies  Bee venom and Morphine and related  Home Medications   Prior to Admission medications   Medication Sig Start Date End Date Taking? Authorizing Provider  acetaminophen (TYLENOL) 650 MG CR tablet Take 650 mg by mouth every 8 (eight) hours as needed for pain.   Yes Historical Provider, MD  albuterol (PROVENTIL HFA;VENTOLIN HFA) 108 (90 BASE) MCG/ACT inhaler Inhale 2 puffs into the lungs every 6 (six) hours as needed for wheezing or shortness of breath. 05/06/15  Yes Truman Hayward, MD  cetirizine (ZYRTEC) 1 MG/ML syrup Take 5 mLs (5 mg total) by mouth 2 (two) times daily. 11/01/15  Yes Domenic Moras, PA-C  dolutegravir (TIVICAY) 50 MG tablet Take 1 tablet (50 mg total) by mouth daily. 03/19/15  Yes Truman Hayward, MD  dronabinol (MARINOL) 5 MG capsule Take 1  capsule (5 mg total) by mouth 2 (two) times daily before a meal. 03/25/15  Yes Truman Hayward, MD  emtricitabine-tenofovir AF (DESCOVY) 200-25 MG tablet Take 1 tablet by mouth daily. 03/19/15  Yes Truman Hayward, MD  hydrocerin (EUCERIN) CREA Apply 1 application topically 2 (two) times daily. Patient taking differently: Apply 1 application topically 2 (two) times daily as needed (dryness).  03/05/15  Yes Michel Bickers, MD  ibuprofen (ADVIL,MOTRIN) 200 MG tablet Take 400 mg by mouth every 6 (six) hours as needed for moderate pain.   Yes Historical Provider, MD  sulfamethoxazole-trimethoprim (BACTRIM DS,SEPTRA DS) 800-160 MG tablet Take 1 tablet by mouth 2 (two) times daily.   Yes Historical Provider, MD  Tetrahydrozoline HCl (VISINE OP) Apply 1-2 drops to eye 4 (four) times daily as needed  (dryness.).   Yes Historical Provider, MD  trimethoprim-polymyxin b (POLYTRIM) ophthalmic solution Place 1 drop into both eyes every 4 (four) hours.   Yes Historical Provider, MD  diclofenac (VOLTAREN) 50 MG EC tablet Take 1 tablet (50 mg total) by mouth 2 (two) times daily. Patient not taking: Reported on 12/09/2015 09/26/15   Ashley Murrain, NP  morphine (MSIR) 15 MG tablet Take 1 tablet (15 mg total) by mouth every 4 (four) hours as needed for severe pain. Patient not taking: Reported on 11/01/2015 09/20/15   Deno Etienne, DO  ondansetron (ZOFRAN) 4 MG tablet Take 1 tablet (4 mg total) by mouth every 8 (eight) hours as needed for nausea or vomiting. Patient not taking: Reported on 11/01/2015 09/26/15   Ashley Murrain, NP  oxyCODONE (ROXICODONE) 5 MG immediate release tablet Take 1 tablet (5 mg total) by mouth every 4 (four) hours as needed for severe pain. Patient not taking: Reported on 11/01/2015 09/20/15   Deno Etienne, DO  traMADol (ULTRAM) 50 MG tablet Take 1 tablet (50 mg total) by mouth every 6 (six) hours as needed. Patient not taking: Reported on 11/01/2015 09/26/15   Ashley Murrain, NP   BP 120/96 mmHg  Pulse 71  Temp(Src) 97.6 F (36.4 C) (Oral)  Resp 20  Ht 5\' 1"  (1.549 m)  Wt 154 lb (69.854 kg)  BMI 29.11 kg/m2  SpO2 100%  LMP 11/21/2015 (Approximate) Physical Exam  Constitutional: She is oriented to person, place, and time. She appears well-developed and well-nourished. She appears distressed.  HENT:  Head: Normocephalic and atraumatic.  Eyes: Conjunctivae and EOM are normal.  Slit lamp exam:      The right eye shows no corneal abrasion, no corneal flare, no corneal ulcer, no foreign body and no fluorescein uptake.       The left eye shows no corneal abrasion, no corneal flare, no corneal ulcer, no foreign body and no fluorescein uptake.  Patient minimally compliant with original exam, pupils are minimal, and patient has a very difficult time opening her eyes bilaterally. There is clear  discharge from history of present illness, no bleeding. Patient denies ability to see from either eye.   Cardiovascular: Normal rate and regular rhythm.   Pulmonary/Chest: Effort normal and breath sounds normal. No stridor. No respiratory distress.  Abdominal: She exhibits no distension.  Musculoskeletal: She exhibits no edema.  Neurological: She is alert and oriented to person, place, and time. No cranial nerve deficit.  Skin: Skin is warm and dry.  Psychiatric: Her mood appears anxious.  Nursing note and vitals reviewed.   ED Course  Procedures (including critical care time) Labs Review Labs Reviewed  CBC  WITH DIFFERENTIAL/PLATELET - Abnormal; Notable for the following:    WBC 2.2 (*)    Hemoglobin 10.4 (*)    HCT 33.6 (*)    MCV 75.0 (*)    MCH 23.2 (*)    RDW 17.8 (*)    Platelets 118 (*)    Neutro Abs 1.1 (*)    Lymphs Abs 0.4 (*)    All other components within normal limits  BASIC METABOLIC PANEL - Abnormal; Notable for the following:    Potassium 3.1 (*)    Calcium 8.3 (*)    All other components within normal limits  CD4/CD8 (T-HELPER/T-SUPPRESSOR CELL)    Imaging Review Ct Head Wo Contrast  12/09/2015  CLINICAL DATA:  35 year old female with a history of HIV. Eye pain. History of vision loss. EXAM: CT HEAD WITHOUT CONTRAST TECHNIQUE: Contiguous axial images were obtained from the base of the skull through the vertex without intravenous contrast. COMPARISON:  CT 08/28/2003 FINDINGS: Unremarkable appearance of the calvarium without acute fracture or aggressive lesion. Unremarkable appearance of the scalp soft tissues. Unremarkable appearance of the visualized bilateral orbits. Mastoid air cells are clear. No significant paranasal sinus disease No acute intracranial hemorrhage, midline shift, or mass effect. Gray-white differentiation is maintained, without CT evidence of acute ischemia. Unremarkable configuration of the ventricles. IMPRESSION: No CT evidence of acute  intracranial abnormality. Signed, Dulcy Fanny. Earleen Newport, DO [facility] Radiology Electronically Signed   By: Corrie Mckusick D.O.   On: 12/09/2015 11:33   I have personally reviewed and evaluated these images and lab results as part of my medical decision-making.  2:25 PM Patient sleeping on repeat exam. Visual acuity 20/100, this actually seems consistent with the exam from one week ago.  I discussed this case with our ophthalmology colleagues, and I am arranging outpatient evaluation today.   5:40 PM Patient sleeping, no distress MDM  Young female with HIV, acknowledged medication compliance presents with new visual servings of both eyes. Patient also has bilateral eye pain. Notably, the patient was recently started on Polytrim, but continues to have bilateral discomfort. Given her immune comprised status, bilateral slit lamp exam was performed. With no evidence for herpetic lesions, nor corneal abrasions. Patient was very uncomfortable, and the exam was somewhat limited, but with those findings, reassuring CT scan, the patient has no obvious infectious, nor neurologic component, and I discussed her case with our ophthalmologist for evaluation in the clinic.   Carmin Muskrat, MD 12/09/15 1740

## 2015-12-09 NOTE — ED Notes (Signed)
Per pt she was seen in the ED last week for the same problem and was prescribed eye drops. She states that the eye drops are not working.

## 2015-12-10 LAB — CD4/CD8 (T-HELPER/T-SUPPRESSOR CELL)
CD4 ABSOLUTE: 30 /uL — AB (ref 500–1900)
CD4%: 5 % — AB (ref 30.0–60.0)
CD8 T Cell Abs: 250 /uL (ref 230–1000)
CD8TOX: 48 % — AB (ref 15.0–40.0)
Ratio: 0.11 — ABNORMAL LOW (ref 1.0–3.0)
TOTAL LYMPHOCYTE COUNT: 510 /uL — AB (ref 1000–4000)

## 2016-01-02 ENCOUNTER — Encounter: Payer: Self-pay | Admitting: *Deleted

## 2016-01-02 ENCOUNTER — Telehealth: Payer: Self-pay | Admitting: *Deleted

## 2016-01-02 NOTE — Telephone Encounter (Signed)
RN will be mailing a letter to the patient's home as a attempt to engage with the patient. Business card has also been included

## 2016-01-09 ENCOUNTER — Encounter (HOSPITAL_COMMUNITY): Payer: Self-pay

## 2016-01-09 ENCOUNTER — Other Ambulatory Visit: Payer: Medicaid Other

## 2016-01-09 ENCOUNTER — Inpatient Hospital Stay (HOSPITAL_COMMUNITY)
Admission: EM | Admit: 2016-01-09 | Discharge: 2016-01-12 | DRG: 570 | Disposition: A | Discharge status: Home Health | Payer: Medicaid Other | Attending: Family Medicine | Admitting: Family Medicine

## 2016-01-09 ENCOUNTER — Emergency Department (HOSPITAL_COMMUNITY): Payer: Medicaid Other

## 2016-01-09 ENCOUNTER — Telehealth: Payer: Self-pay

## 2016-01-09 DIAGNOSIS — B20 Human immunodeficiency virus [HIV] disease: Secondary | ICD-10-CM | POA: Diagnosis present

## 2016-01-09 DIAGNOSIS — L0231 Cutaneous abscess of buttock: Principal | ICD-10-CM | POA: Diagnosis present

## 2016-01-09 DIAGNOSIS — Z8249 Family history of ischemic heart disease and other diseases of the circulatory system: Secondary | ICD-10-CM

## 2016-01-09 DIAGNOSIS — N764 Abscess of vulva: Secondary | ICD-10-CM | POA: Diagnosis present

## 2016-01-09 DIAGNOSIS — E876 Hypokalemia: Secondary | ICD-10-CM | POA: Diagnosis present

## 2016-01-09 DIAGNOSIS — G629 Polyneuropathy, unspecified: Secondary | ICD-10-CM | POA: Diagnosis present

## 2016-01-09 DIAGNOSIS — L02411 Cutaneous abscess of right axilla: Secondary | ICD-10-CM | POA: Diagnosis present

## 2016-01-09 DIAGNOSIS — K611 Rectal abscess: Secondary | ICD-10-CM

## 2016-01-09 DIAGNOSIS — D61818 Other pancytopenia: Secondary | ICD-10-CM | POA: Diagnosis present

## 2016-01-09 DIAGNOSIS — F339 Major depressive disorder, recurrent, unspecified: Secondary | ICD-10-CM | POA: Diagnosis present

## 2016-01-09 DIAGNOSIS — J4520 Mild intermittent asthma, uncomplicated: Secondary | ICD-10-CM | POA: Diagnosis not present

## 2016-01-09 DIAGNOSIS — B9561 Methicillin susceptible Staphylococcus aureus infection as the cause of diseases classified elsewhere: Secondary | ICD-10-CM | POA: Diagnosis present

## 2016-01-09 DIAGNOSIS — F17210 Nicotine dependence, cigarettes, uncomplicated: Secondary | ICD-10-CM | POA: Diagnosis present

## 2016-01-09 DIAGNOSIS — Z91030 Bee allergy status: Secondary | ICD-10-CM

## 2016-01-09 DIAGNOSIS — Z885 Allergy status to narcotic agent status: Secondary | ICD-10-CM

## 2016-01-09 DIAGNOSIS — J45909 Unspecified asthma, uncomplicated: Secondary | ICD-10-CM | POA: Diagnosis present

## 2016-01-09 DIAGNOSIS — Z825 Family history of asthma and other chronic lower respiratory diseases: Secondary | ICD-10-CM

## 2016-01-09 DIAGNOSIS — Z79899 Other long term (current) drug therapy: Secondary | ICD-10-CM | POA: Diagnosis not present

## 2016-01-09 DIAGNOSIS — F419 Anxiety disorder, unspecified: Secondary | ICD-10-CM | POA: Diagnosis present

## 2016-01-09 DIAGNOSIS — Z833 Family history of diabetes mellitus: Secondary | ICD-10-CM | POA: Diagnosis not present

## 2016-01-09 DIAGNOSIS — Z9119 Patient's noncompliance with other medical treatment and regimen: Secondary | ICD-10-CM | POA: Diagnosis not present

## 2016-01-09 DIAGNOSIS — Z808 Family history of malignant neoplasm of other organs or systems: Secondary | ICD-10-CM | POA: Diagnosis not present

## 2016-01-09 DIAGNOSIS — D72819 Decreased white blood cell count, unspecified: Secondary | ICD-10-CM | POA: Diagnosis present

## 2016-01-09 LAB — COMPREHENSIVE METABOLIC PANEL
ALT: 32 U/L (ref 14–54)
AST: 81 U/L — AB (ref 15–41)
Albumin: 3.2 g/dL — ABNORMAL LOW (ref 3.5–5.0)
Alkaline Phosphatase: 101 U/L (ref 38–126)
Anion gap: 8 (ref 5–15)
BILIRUBIN TOTAL: 0.7 mg/dL (ref 0.3–1.2)
BUN: 14 mg/dL (ref 6–20)
CHLORIDE: 107 mmol/L (ref 101–111)
CO2: 21 mmol/L — ABNORMAL LOW (ref 22–32)
CREATININE: 0.88 mg/dL (ref 0.44–1.00)
Calcium: 8.2 mg/dL — ABNORMAL LOW (ref 8.9–10.3)
GFR calc Af Amer: >60 mL/min (ref >60)
GLUCOSE: 77 mg/dL (ref 65–99)
Potassium: 3 mmol/L — ABNORMAL LOW (ref 3.5–5.1)
Sodium: 136 mmol/L (ref 135–145)
TOTAL PROTEIN: 8.1 g/dL (ref 6.5–8.1)

## 2016-01-09 LAB — CBC WITH DIFFERENTIAL/PLATELET
BASOS ABS: 0 {cells}/uL (ref 0–200)
BASOS ABS: 0 10*3/uL (ref 0.0–0.1)
Basophils Relative: 0 %
Basophils Relative: 0 %
EOS ABS: 116 {cells}/uL (ref 15–500)
Eosinophils Absolute: 0.1 10*3/uL (ref 0.0–0.7)
Eosinophils Relative: 4 %
Eosinophils Relative: 2 %
HCT: 28.6 % — ABNORMAL LOW (ref 36.0–46.0)
HEMATOCRIT: 29 % — AB (ref 35.0–45.0)
HEMOGLOBIN: 9.2 g/dL — AB (ref 11.7–15.5)
HEMOGLOBIN: 8.9 g/dL — AB (ref 12.0–15.0)
LYMPHS ABS: 609 {cells}/uL — AB (ref 850–3900)
LYMPHS PCT: 20 %
Lymphocytes Relative: 21 %
Lymphs Abs: 0.6 10*3/uL — ABNORMAL LOW (ref 0.7–4.0)
MCH: 23.5 pg — AB (ref 27.0–33.0)
MCH: 23.7 pg — ABNORMAL LOW (ref 26.0–34.0)
MCHC: 31.7 g/dL — AB (ref 32.0–36.0)
MCHC: 31.1 g/dL (ref 30.0–36.0)
MCV: 74 fL — ABNORMAL LOW (ref 80.0–100.0)
MCV: 76.1 fL — AB (ref 78.0–100.0)
MONO ABS: 261 {cells}/uL (ref 200–950)
Monocytes Absolute: 0.3 10*3/uL (ref 0.1–1.0)
Monocytes Relative: 9 %
Monocytes Relative: 12 %
NEUTROS ABS: 1914 {cells}/uL (ref 1500–7800)
NEUTROS ABS: 1.9 10*3/uL (ref 1.7–7.7)
NEUTROS PCT: 66 %
NEUTROS PCT: 66 %
Platelets: 153 10*3/uL (ref 140–400)
Platelets: 114 10*3/uL — ABNORMAL LOW (ref 150–400)
RBC: 3.92 MIL/uL (ref 3.80–5.10)
RBC: 3.76 MIL/uL — AB (ref 3.87–5.11)
RDW: 17.6 % — ABNORMAL HIGH (ref 11.0–15.0)
RDW: 17.6 % — ABNORMAL HIGH (ref 11.5–15.5)
WBC: 2.9 10*3/uL — ABNORMAL LOW (ref 3.8–10.8)
WBC: 2.9 10*3/uL — AB (ref 4.0–10.5)

## 2016-01-09 LAB — POC URINE PREG, ED: PREG TEST UR: NEGATIVE

## 2016-01-09 LAB — LACTIC ACID, PLASMA: LACTIC ACID, VENOUS: 0.9 mmol/L (ref 0.5–1.9)

## 2016-01-09 MED ORDER — HYDROMORPHONE HCL 1 MG/ML IJ SOLN
1.0000 mg | Freq: Once | INTRAMUSCULAR | Status: AC
  Administered 2016-01-09: 1 mg via INTRAVENOUS
  Filled 2016-01-09: qty 1

## 2016-01-09 MED ORDER — VANCOMYCIN HCL IN DEXTROSE 1-5 GM/200ML-% IV SOLN
1000.0000 mg | Freq: Once | INTRAVENOUS | Status: AC
  Administered 2016-01-09: 1000 mg via INTRAVENOUS
  Filled 2016-01-09: qty 200

## 2016-01-09 MED ORDER — IOPAMIDOL (ISOVUE-300) INJECTION 61%
100.0000 mL | Freq: Once | INTRAVENOUS | Status: AC | PRN
  Administered 2016-01-09: 100 mL via INTRAVENOUS

## 2016-01-09 MED ORDER — METRONIDAZOLE IN NACL 5-0.79 MG/ML-% IV SOLN
500.0000 mg | Freq: Once | INTRAVENOUS | Status: AC
  Administered 2016-01-09: 500 mg via INTRAVENOUS
  Filled 2016-01-09: qty 100

## 2016-01-09 MED ORDER — CIPROFLOXACIN IN D5W 400 MG/200ML IV SOLN
400.0000 mg | Freq: Once | INTRAVENOUS | Status: AC
  Administered 2016-01-09: 400 mg via INTRAVENOUS
  Filled 2016-01-09: qty 200

## 2016-01-09 NOTE — ED Notes (Signed)
Pt presents with c/o boil in/around her buttocks. Pt reports she went to the HIV clinic and had them look at the area and she was told to come to the ER. Pt reports that the area is warm to the touch and is large in size.

## 2016-01-09 NOTE — ED Provider Notes (Signed)
I saw and evaluated the patient, reviewed the resident's note and I agree with the findings and plan.   EKG Interpretation None      35 year old female history of HIV low CD4 count presents with swelling to her right buttock. On exam she has a large fluctuant area with possible extension into the perineum. Will obtain CT of abdomen and pelvis and likely consult general surgery  Lacretia Leigh, MD 01/09/16 2004

## 2016-01-09 NOTE — H&P (Signed)
History and Physical  Patient Name: Beverly [name]     [id number]    DOB: 1981/05/20    DOA: 01/09/2016 PCP: No primary care provider on file.   Patient coming from: Home  Chief Complaint: Buttock abscess  HPI: Beverly [name] is a 35 y.o. 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  The patient was in her usual state of health until about 1-2 weeks ago when she was in jail for the weekend, started to develop an "ingrown hair" in her groin because of the uniform. Since returning home, the spot kept getting bigger and more tender, despite Sitz bath and warm compresses.  In the last two days, she has had some nausea, chills and pain with sitting.  [facility] and asked a nurse to see her buttock, which was very tender on exam, so she was sent to the ER.  ED course: -Afebrile, heart rate 116, respirations and blood pressure normal, saturating well on ambient air -Na 136, K 3.0, Cr 0.9 (baseline), WBC 2.9 K, Hgb 8.9, microcytic, pregnancy test negative, last CD4 count 25 -CT of the abdomen and pelvis showed a 6 x 3 cm fluid collection in the right buttocks as well as a 1.5 x 0.5 cm collection in the left labia -She was given Cipro and Flagyl and TRH were asked to evaluate for admission -The case was discussed with general surgery who recommended debridement in the OR     ROS: Pt endorses above symptoms plus weakness and fatigue over weeks to months.  Pt denies any vomiting, fever, weakness, confusion, syncope, cough, chest congestion, chest pain, palpitations, dyspnea on exertion, leg swelling, rashes.    All other systems negative except as just noted or noted in the history of present illness.    Past Medical History  Diagnosis Date  . Anxiety   . Depression   . HIV infection ([place])   . Asthma   . Abscess   . AIDS ([place]) 03/19/2015  . Leg lesion 03/19/2015    . Major depression, recurrent ([place]) 03/19/2015  . Homeless 03/19/2015  . 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 Skin ulcer ([place]) 05/06/2015    Past Surgical History  Procedure Laterality Date  . Cesarean section      Social History: Patient lives by herself, will lose her apartment/home on Aug 12.  The patient walks unassisted.  She is a certified MA.  She smokes 1/8 ppd.  No alcohol.  Marijuana weekly, ecstasy occasionally, no IVDU.    Allergies  Allergen Reactions  . Bee Venom Anaphylaxis, Shortness Of Breath and Swelling  . Morphine And Related Hives and Itching    Just can't take "morphine", vicodin is OK    Family history: family history includes Asthma in her other; Cancer in her other; Diabetes in her other; Hypertension in her other; Throat cancer in her father and mother.  Prior to Admission medications   Medication Sig Start Date End Date Taking? Authorizing Provider  acetaminophen (TYLENOL) 650 MG CR tablet Take 650 mg by mouth every 8 (eight) hours as needed for pain.   Yes Historical Provider, MD  albuterol (PROVENTIL HFA;VENTOLIN HFA) 108 (90 BASE) MCG/ACT inhaler Inhale 2 puffs into the lungs every 6 (six) hours as needed for wheezing or shortness of breath. 05/06/15  Yes Truman Hayward, MD  cetirizine (ZYRTEC) 1 MG/ML syrup  Take 5 mLs (5 mg total) by mouth 2 (two) times daily. 11/01/15  Yes Domenic Moras, PA-C  dolutegravir (TIVICAY) 50 MG tablet Take 1 tablet (50 mg total) by mouth daily. 03/19/15  Yes Truman Hayward, MD  dronabinol (MARINOL) 5 MG capsule Take 1 capsule (5 mg total) by mouth 2 (two) times daily before a meal. 03/25/15  Yes Truman Hayward, MD  emtricitabine-tenofovir AF (DESCOVY) 200-25 MG tablet Take 1 tablet by mouth daily. 03/19/15  Yes Truman Hayward, MD  hydrocerin (EUCERIN) CREA Apply 1 application topically 2 (two) times daily. Patient taking differently: Apply 1 application topically 2 (two) times daily as needed (dryness).   03/05/15  Yes Michel Bickers, MD  ibuprofen (ADVIL,MOTRIN) 200 MG tablet Take 800 mg by mouth every 6 (six) hours as needed for moderate pain.    Yes Historical Provider, MD  Tetrahydrozoline HCl (VISINE OP) Apply 1-2 drops to eye 4 (four) times daily as needed (dryness.).   Yes Historical Provider, MD  trimethoprim-polymyxin b (POLYTRIM) ophthalmic solution Place 1 drop into both eyes every 4 (four) hours.   Yes Historical Provider, MD  diclofenac (VOLTAREN) 50 MG EC tablet Take 1 tablet (50 mg total) by mouth 2 (two) times daily. Patient not taking: Reported on 12/09/2015 09/26/15   Ashley Murrain, NP  morphine (MSIR) 15 MG tablet Take 1 tablet (15 mg total) by mouth every 4 (four) hours as needed for severe pain. Patient not taking: Reported on 11/01/2015 09/20/15   Deno Etienne, DO  ondansetron (ZOFRAN) 4 MG tablet Take 1 tablet (4 mg total) by mouth every 8 (eight) hours as needed for nausea or vomiting. Patient not taking: Reported on 11/01/2015 09/26/15   Ashley Murrain, NP  oxyCODONE (ROXICODONE) 5 MG immediate release tablet Take 1 tablet (5 mg total) by mouth every 4 (four) hours as needed for severe pain. Patient not taking: Reported on 11/01/2015 09/20/15   Deno Etienne, DO  traMADol (ULTRAM) 50 MG tablet Take 1 tablet (50 mg total) by mouth every 6 (six) hours as needed. Patient not taking: Reported on 11/01/2015 09/26/15   Ashley Murrain, NP       Physical Exam: BP 120/79 mmHg  Pulse 105  Temp(Src) 98.8 F (37.1 C) (Oral)  Resp 18  Ht 5\' 1"  (1.549 m)  Wt 65.772 kg (145 lb)  BMI 27.41 kg/m2  SpO2 100%  LMP 12/21/2015 (Approximate) General appearance: Well-developed, female, alert and in no acute distress.   Eyes: Conjunctiva normal, lids and lashes normal.   PERRL.  ENT: No nasal deformity, discharge.  OP moist without lesions.   Lymph: No cervical or supraclavicular lymphadenopathy. Skin: Warm and dry.  Numerous scars and tender lumps in both axilla, both groin. Hyperpigmentation of the right  shin from an old dermatitis treated with prednisone 6 months ago. In the right gluteal cleft there is firmness, tense (probably fluctuant) and extreme tenderness. The left labia also has a small grape-sized swelling that is tender. No drainage from either site. Cardiac: Tachycardic, nl S1-S2, no murmurs appreciated.  Capillary refill is brisk.  JVP normal.  No LE edema.  Radial and DP pulses 2+ and symmetric. Respiratory: Normal respiratory rate and rhythm.  CTAB without rales or wheezes. GI: Abdomen soft without rigidity.  No TTP. No ascites, distension, hepatosplenomegaly.   MSK: No deformities or effusions.  No clubbing/cyanosis. Neuro: Cranial nerves normal. Sensorium intact and responding to questions, attention normal.  Speech is fluent.  Moves  all extremities equally and with normal coordination.    Psych: Affect normal.  Judgment and insight appear normal.       Labs on Admission:  I have personally reviewed following labs and imaging studies: CBC:  Recent Labs Lab 01/09/16 1622 01/09/16 2014  WBC 2.9* 2.9*  NEUTROABS 1914 1.9  HGB 9.2* 8.9*  HCT 29.0* 28.6*  MCV 74.0* 76.1*  PLT 153 [id number]*   Basic Metabolic Panel:  Recent Labs Lab 01/09/16 2014  NA 136  K 3.0*  CL 107  CO2 21*  GLUCOSE 77  BUN 14  CREATININE 0.88  CALCIUM 8.2*   GFR: Estimated Creatinine Clearance: 78.9 mL/min (by C-G formula based on Cr of 0.88).  Liver Function Tests:  Recent Labs Lab 01/09/16 2014  AST 81*  ALT 32  ALKPHOS 101  BILITOT 0.7  PROT 8.1  ALBUMIN 3.2*   Sepsis Labs: Lactic acid 0.9        Radiological Exams on Admission: Personally reviewed: Ct Abdomen Pelvis W Contrast  01/09/2016  CLINICAL DATA:  Right buttocks abscess. EXAM: CT ABDOMEN AND PELVIS WITH CONTRAST TECHNIQUE: Multidetector CT imaging of the abdomen and pelvis was performed using the standard protocol following bolus administration of intravenous contrast. CONTRAST:  125mL ISOVUE-300 IOPAMIDOL  (ISOVUE-300) INJECTION 61% COMPARISON:  None. FINDINGS: Lower chest and abdominal wall: Ovoid rim enhancing fluid collection in the left labia measuring 15 by 6 mm. There is surrounding cellulitis. 60 x 25 mm fluid collection in the right buttocks distorting the gluteal cleft. Rim enhancement is less well-defined but this still to be liquifaction consistent with at least partial abscess. Neighboring cellulitis. There is focal cellulitic change over the right lower abdominal wall. No soft tissue emphysema. Hepatobiliary: No focal liver abnormality.No evidence of biliary obstruction or stone. Pancreas: Unremarkable. Spleen: Unremarkable. Adrenals/Urinary Tract: Negative adrenals. Small nonobstructing right renal calculi, at least 3, measuring up to 4 mm. At least 2 punctate left renal calculi. No hydronephrosis or ureteral calculus . There is a crescentic shaped fluid collection right and ventral to the lower urethra, measuring up to 13 by 6 mm. Stomach/Bowel:  No obstruction. No appendicitis. Reproductive:Negative uterus and ovaries. Vascular/Lymphatic: No acute vascular abnormality. Bilateral inguinal and external chain iliac adenopathy, presumably reactive given the pelvic findings. Other: No ascites or pneumoperitoneum. Musculoskeletal: No acute abnormalities. IMPRESSION: 1. 6 x 3 cm right buttocks abscess and phlegmon.No soft tissue emphysema or supralevator involvement. 2. 15 x 6 mm left labia abscess. 3. Urethral diverticulum. 4. Bilateral nephrolithiasis. 5. Inguinal and iliac adenopathy presumably reactive to #1 and 2. Recommend clinical follow-up. Electronically Signed   By: Monte Fantasia M.D.   On: 01/09/2016 22:22        Assessment/Plan 1. Abscess of the right buttock:  6 x 3.5 cm fluid collection in right buttock, small labial cyst, infected in jail. High risk for MRSA, will also cover for gram-negative and anaerobes with Cefepime.  She meets SIRS criteria but has normal lactate and no other  evidence of end-organ dysfunction, sepsis is ruled out. -Vancomycin and cefepime -Follow blood cultures -Consult to General surgery for debridement, appreciate cares -NPO at midnight for OR tomorrow -Consult to ID, appreciate cares -Wound consult for post-op dressing changes   2. AIDS/HIV:  -Continue home Tivicay and Descovy -Consult to ID  3. Hypokalemia:  -MIVF with K -Check mag  4. Social instability, near homeless, poor medical follow up:  -Consult to social work for housing assistance, connect with [facility] to care management  for home health needs  5. Depression:   6. 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  Possibly iron deficiency contributing.   -Check iron studies -Iron supplement for now -Agree with Gen Surg, transfusion threshold 7 g/dL  7. Asthma:  -Albuterol PRN     DVT prophylaxis: SCDs  Code Status: FULL  Family Communication: None present  Disposition Plan: Anticipate to OR tomorrow morning, follow culture data and de-escalate antibiotics as able. Consults called: Gen Surg Admission status: INPATIENT, med surg    Medical decision making: Patient seen at 11:27 PM on 01/09/2016.  The patient was discussed with Dr. Zenia Resides and Dr. Redmond Pulling. What exists of the patient's chart was reviewed in depth.  Clinical condition: stable but requiring IV fluids.        Edwin Dada [facility] Pager [phone]

## 2016-01-09 NOTE — Telephone Encounter (Signed)
Patient was here today to meet with Case Manager , [place] with [facility] and to complete labs.  She asked to see a nurse due to a "lump"in her groin area that has grown large and is painful.  She asked that I examine area. It is apparent it is causing pain due to the way she is walking with a limp or lifting her leg off the floor.  She has an very  enlarged boil with swelling present on upper right thigh near labia.  No drainage present and area looks ashen.  It is very warm and  tender to touch . She jumped off the table due to pain when I attempted to palpate area.  Patient  states it started as a small painful bump and doubled in size with a few days. She has been feverish for 3 days and was taking 400 mgs of ibuprofen until yesterday when she ran out.  Pain worsened after she attempted to take a bath.   She also has a boil present right axilla.    There are no providers here this evening so patient was advised to go to emergency room for evaluation as soon as possible.    Laverle Patter, RN

## 2016-01-09 NOTE — Consult Note (Signed)
Reason for Consult: Buttock abscess  Referring Physician: Dr. Warrick Parisian is an 35 y.o. female.  HPI: This is a 35 year old HIV positive/AIDS female who presents for evaluation of a boil on her buttocks. It first appeared Saturday as a "hair bump" on her right buttock which has progressed "rapidly" since its appearance. She believes she got the boil because she does "weekends in prison." She's tried soaking in a warm epsom salt bath without relief. She went to her HIV clinic and was told to come to the ER for evaluation.  She complains of fever, chills, 10/10 pain on her right buttock that radiates down her right leg. She complains also of a boil that developed under her right arm this week as well. She notes she's had boils in the past that were lanced, but has never had one this size nor this painful. She does smoke. A pack will last her about a week. She states that she has missed some of her doses of HIV medication over the past month or so. She is currently not homeless about her current living situation will have to change after the seventh.  Last CD4 count in late June 2017 was 30. Current HIV viral load and titers are pending Past Medical History  Diagnosis Date  . Anxiety   . Depression   . HIV infection ([place])   . Asthma   . Abscess   . AIDS ([place]) 03/19/2015  . Leg lesion 03/19/2015  . Major depression, recurrent ([place]) 03/19/2015  . Homeless 03/19/2015  . 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 Skin ulcer ([place]) 05/06/2015    Past Surgical History  Procedure Laterality Date  . Cesarean section      Family History  Problem Relation Age of Onset  . Hypertension Other   . Cancer Other   . Diabetes Other   . Asthma Other   . Throat cancer Mother   . Throat cancer Father     Social History:  reports that she has been smoking Cigarettes.  She has a .9 pack-year smoking history. She has never used smokeless tobacco. She reports that she drinks alcohol. She reports that  she uses illicit drugs (Marijuana) about twice per week.  Allergies:  Allergies  Allergen Reactions  . Bee Venom Anaphylaxis, Shortness Of Breath and Swelling  . Morphine And Related Hives and Itching    Just can't take "morphine", vicodin is OK    Medications: I have reviewed the patient's current medications.  Results for orders placed or performed during the hospital encounter of 01/09/16 (from the past 48 hour(s))  CBC with Differential     Status: Abnormal   Collection Time: 01/09/16  8:14 PM  Result Value Ref Range   WBC 2.9 (L) 4.0 - 10.5 K/uL   RBC 3.76 (L) 3.87 - 5.11 MIL/uL   Hemoglobin 8.9 (L) 12.0 - 15.0 g/dL   HCT 28.6 (L) 36.0 - 46.0 %   MCV 76.1 (L) 78.0 - 100.0 fL   MCH 23.7 (L) 26.0 - 34.0 pg   MCHC 31.1 30.0 - 36.0 g/dL   RDW 17.6 (H) 11.5 - 15.5 %   Platelets 114 (L) 150 - 400 K/uL   Neutrophils Relative % 66 %   Neutro Abs 1.9 1.7 - 7.7 K/uL   Lymphocytes Relative 20 %   Lymphs Abs 0.6 (L) 0.7 - 4.0 K/uL   Monocytes Relative 12 %   Monocytes Absolute 0.3 0.1 - 1.0 K/uL  Eosinophils Relative 2 %   Eosinophils Absolute 0.1 0.0 - 0.7 K/uL   Basophils Relative 0 %   Basophils Absolute 0.0 0.0 - 0.1 K/uL  Comprehensive metabolic panel     Status: Abnormal   Collection Time: 01/09/16  8:14 PM  Result Value Ref Range   Sodium 136 135 - 145 mmol/L   Potassium 3.0 (L) 3.5 - 5.1 mmol/L   Chloride 107 101 - 111 mmol/L   CO2 21 (L) 22 - 32 mmol/L   Glucose, Bld 77 65 - 99 mg/dL   BUN 14 6 - 20 mg/dL   Creatinine, Ser 0.88 0.44 - 1.00 mg/dL   Calcium 8.2 (L) 8.9 - 10.3 mg/dL   Total Protein 8.1 6.5 - 8.1 g/dL   Albumin 3.2 (L) 3.5 - 5.0 g/dL   AST 81 (H) 15 - 41 U/L   ALT 32 14 - 54 U/L   Alkaline Phosphatase 101 38 - 126 U/L   Total Bilirubin 0.7 0.3 - 1.2 mg/dL   GFR calc non Af Amer >60 >60 mL/min   GFR calc Af Amer >60 >60 mL/min    Comment: (NOTE) The eGFR has been calculated using the CKD EPI equation. This calculation has not been validated in  all clinical situations. eGFR's persistently <60 mL/min signify possible Chronic Kidney Disease.    Anion gap 8 5 - 15  POC Urine Pregnancy, ED (do NOT order at [facility])     Status: None   Collection Time: 01/09/16  9:13 PM  Result Value Ref Range   Preg Test, Ur NEGATIVE NEGATIVE    Comment:        THE SENSITIVITY OF THIS METHODOLOGY IS >24 mIU/mL     Ct Abdomen Pelvis W Contrast  01/09/2016  CLINICAL DATA:  Right buttocks abscess. EXAM: CT ABDOMEN AND PELVIS WITH CONTRAST TECHNIQUE: Multidetector CT imaging of the abdomen and pelvis was performed using the standard protocol following bolus administration of intravenous contrast. CONTRAST:  187m ISOVUE-300 IOPAMIDOL (ISOVUE-300) INJECTION 61% COMPARISON:  None. FINDINGS: Lower chest and abdominal wall: Ovoid rim enhancing fluid collection in the left labia measuring 15 by 6 mm. There is surrounding cellulitis. 60 x 25 mm fluid collection in the right buttocks distorting the gluteal cleft. Rim enhancement is less well-defined but this still to be liquifaction consistent with at least partial abscess. Neighboring cellulitis. There is focal cellulitic change over the right lower abdominal wall. No soft tissue emphysema. Hepatobiliary: No focal liver abnormality.No evidence of biliary obstruction or stone. Pancreas: Unremarkable. Spleen: Unremarkable. Adrenals/Urinary Tract: Negative adrenals. Small nonobstructing right renal calculi, at least 3, measuring up to 4 mm. At least 2 punctate left renal calculi. No hydronephrosis or ureteral calculus . There is a crescentic shaped fluid collection right and ventral to the lower urethra, measuring up to 13 by 6 mm. Stomach/Bowel:  No obstruction. No appendicitis. Reproductive:Negative uterus and ovaries. Vascular/Lymphatic: No acute vascular abnormality. Bilateral inguinal and external chain iliac adenopathy, presumably reactive given the pelvic findings. Other: No ascites or pneumoperitoneum. Musculoskeletal:  No acute abnormalities. IMPRESSION: 1. 6 x 3 cm right buttocks abscess and phlegmon.No soft tissue emphysema or supralevator involvement. 2. 15 x 6 mm left labia abscess. 3. Urethral diverticulum. 4. Bilateral nephrolithiasis. 5. Inguinal and iliac adenopathy presumably reactive to #1 and 2. Recommend clinical follow-up. Electronically Signed   By: JMonte FantasiaM.D.   On: 01/09/2016 22:22    Review of Systems  Constitutional: Positive for fever and chills. Negative for weight loss.  HENT: Negative for nosebleeds.   Eyes: Negative for blurred vision.  Respiratory: Negative for shortness of breath.   Cardiovascular: Positive for chest pain (some chest tightness, no radiation to arm/jaw). Negative for palpitations, orthopnea and PND.       Denies DOE  Gastrointestinal: Negative for nausea, vomiting and abdominal pain.  Genitourinary: Negative for dysuria and hematuria.  Musculoskeletal: Negative.   Skin: Negative for itching and rash.  Neurological: Negative for dizziness, focal weakness, seizures, loss of consciousness and headaches.       Denies TIAs, amaurosis fugax  Endo/Heme/Allergies: Does not bruise/bleed easily.  Psychiatric/Behavioral: The patient is not nervous/anxious.        Uses THC, last yesterday. Denies cocaine, heroin. Used ectasy about 2 weeks ago   Blood pressure 120/79, pulse 105, temperature 98.8 F (37.1 C), temperature source Oral, resp. rate 18, height '5\' 1"'  (1.549 m), weight 65.772 kg (145 lb), last menstrual period 12/21/2015, SpO2 100 %. Physical Exam  Vitals reviewed. Constitutional: She is oriented to person, place, and time. She appears well-developed and well-nourished.  Non-toxic appearance. She does not appear ill. No distress.  [facility] present during exam  HENT:  Head: Normocephalic and atraumatic.  Right Ear: External ear normal.  Left Ear: External ear normal.  Eyes: Conjunctivae are normal. No scleral icterus.  Neck: Normal range of  motion. Neck supple. No tracheal deviation present.  Cardiovascular: Normal rate and normal heart sounds.   Respiratory: Effort normal and breath sounds normal. No stridor. No respiratory distress. She has no wheezes.  GI: Soft. She exhibits no distension. There is no tenderness.  Musculoskeletal: She exhibits no edema or tenderness.  Neurological: She is alert and oriented to person, place, and time. She exhibits normal muscle tone.  Skin: Skin is warm and dry. No rash noted. She is not diaphoretic. No pallor.     Right axilla-about a 3 cm area that is raised and tender but no cellulitis, no induration but possible fluctuance. Exam is limited due to tenderness. Buttock-medial right buttock has hyperpigmentation, tender, swollen consistent with fluctuance for about 6 cm x 4 cm, extends toward perineum. Again exam is very limited due to patient discomfort. No crepitus. Old sores on labia mainly left-sided and and left groin region. Old hyperpigmented area of skin on RLE  Psychiatric: She has a normal mood and affect. Her behavior is normal. Judgment and thought content normal.    Assessment/Plan: Right buttock abscess Left labial sores Right axillary abscess possible hydradenitis AIDS Pancytopenia  Her exam is limited however she is nontoxic appearing. Therefore I believe the right buttock abscess can be drained in the operating room in the morning. I will defer to Dr. Excell Seltzer whether or not the left labial sores need to be lanced as well as the right axilla. Nothing by mouth after midnight. Agree with broad-spectrum IV antibiotics. Agree with blood cultures.  Consider ID consult Discuss what surgery would involve. Discussed risk and benefits such as bleeding, infection, recurrence, scarring, need for additional procedures, perioperative cardiac and pulmonary events, blood clots. Discussed that this would have to heal from the bottom up and would need dressing changes. Discussed that this will  take a while to heal. Discussed that smoking and her immunocompromised state will slow wound healing.  Dr. Excell Seltzer to see in the morning  Leighton Ruff. Redmond Pulling, MD, FACS General, Bariatric, & Minimally Invasive Surgery [facility], [place]   [facility] M 01/09/2016, 11:41 PM

## 2016-01-09 NOTE — ED Provider Notes (Signed)
CSN: [id number]     Arrival date & time 01/09/16  1702 History   First MD Initiated Contact with Patient 01/09/16 1853     Chief Complaint  Patient presents with  . Recurrent Skin Infections     (Consider location/radiation/quality/duration/timing/severity/associated sxs/prior Treatment) HPI   This is a 35 year old HIV positive female (last CD4 count = 30 on 12/09/15) who presents for evaluation of a boil on her buttocks. It first appeared Saturday as a "hair bump" on her right buttock which has progressed "rapidly" since its appearance. She believes she got the boil because she does "weekends in prison." She's tried soaking in a warm epsom salt bath without relief. She went to her HIV clinic and was told to come to the ER for evaluation.  She complains of fever, chills, 10/10 pain on her right buttock that radiates down her right leg. She complains also of a boil that developed under her right arm this week as well. She notes she's had boils in the past that were lanced, but has never had one this size nor this painful and wishes to have it drained under anesthesia.  Past Medical History  Diagnosis Date  . Anxiety   . Depression   . HIV infection ([place])   . Asthma   . Abscess   . AIDS ([place]) 03/19/2015  . Leg lesion 03/19/2015  . Major depression, recurrent ([place]) 03/19/2015  . Homeless 03/19/2015  . 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 Skin ulcer ([place]) 05/06/2015   Past Surgical History  Procedure Laterality Date  . Cesarean section     Family History  Problem Relation Age of Onset  . Hypertension Other   . Cancer Other   . Diabetes Other   . Asthma Other   . Throat cancer Mother   . Throat cancer Father    Social History  Substance Use Topics  . Smoking status: Current Every Day Smoker -- 0.30 packs/day for 3 years    Types: Cigarettes  . Smokeless tobacco: Never Used  . Alcohol Use: 0.0 oz/week    0 Standard drinks or equivalent per week     Comment: 5-6 drinks a  month.    OB History    No data available     Review of Systems  Constitutional: Positive for fever and chills.  HENT: Negative for mouth sores.   Respiratory: Negative for cough, shortness of breath and wheezing.   Cardiovascular: Negative for chest pain and palpitations.  Gastrointestinal: Positive for nausea and rectal pain. Negative for vomiting, abdominal pain, diarrhea, constipation and blood in stool.  Genitourinary: Negative for dysuria, urgency, frequency, vaginal discharge and difficulty urinating.  Musculoskeletal: Negative for myalgias and arthralgias.  Neurological: Negative for dizziness and headaches.      Allergies  Bee venom and Morphine and related  Home Medications   Prior to Admission medications   Medication Sig Start Date End Date Taking? Authorizing Provider  acetaminophen (TYLENOL) 650 MG CR tablet Take 650 mg by mouth every 8 (eight) hours as needed for pain.    Historical Provider, MD  albuterol (PROVENTIL HFA;VENTOLIN HFA) 108 (90 BASE) MCG/ACT inhaler Inhale 2 puffs into the lungs every 6 (six) hours as needed for wheezing or shortness of breath. 05/06/15   Truman Hayward, MD  cetirizine (ZYRTEC) 1 MG/ML syrup Take 5 mLs (5 mg total) by mouth 2 (two) times daily. 11/01/15   Domenic Moras, PA-C  diclofenac (VOLTAREN) 50 MG EC tablet Take  1 tablet (50 mg total) by mouth 2 (two) times daily. Patient not taking: Reported on 12/09/2015 09/26/15   Ashley Murrain, NP  dolutegravir (TIVICAY) 50 MG tablet Take 1 tablet (50 mg total) by mouth daily. 03/19/15   Truman Hayward, MD  dronabinol (MARINOL) 5 MG capsule Take 1 capsule (5 mg total) by mouth 2 (two) times daily before a meal. 03/25/15   Truman Hayward, MD  emtricitabine-tenofovir AF (DESCOVY) 200-25 MG tablet Take 1 tablet by mouth daily. 03/19/15   Truman Hayward, MD  hydrocerin (EUCERIN) CREA Apply 1 application topically 2 (two) times daily. Patient taking differently: Apply 1 application  topically 2 (two) times daily as needed (dryness).  03/05/15   Michel Bickers, MD  ibuprofen (ADVIL,MOTRIN) 200 MG tablet Take 400 mg by mouth every 6 (six) hours as needed for moderate pain.    Historical Provider, MD  morphine (MSIR) 15 MG tablet Take 1 tablet (15 mg total) by mouth every 4 (four) hours as needed for severe pain. Patient not taking: Reported on 11/01/2015 09/20/15   Deno Etienne, DO  ondansetron (ZOFRAN) 4 MG tablet Take 1 tablet (4 mg total) by mouth every 8 (eight) hours as needed for nausea or vomiting. Patient not taking: Reported on 11/01/2015 09/26/15   Ashley Murrain, NP  oxyCODONE (ROXICODONE) 5 MG immediate release tablet Take 1 tablet (5 mg total) by mouth every 4 (four) hours as needed for severe pain. Patient not taking: Reported on 11/01/2015 09/20/15   Deno Etienne, DO  sulfamethoxazole-trimethoprim (BACTRIM DS,SEPTRA DS) 800-160 MG tablet Take 1 tablet by mouth 2 (two) times daily.    Historical Provider, MD  Tetrahydrozoline HCl (VISINE OP) Apply 1-2 drops to eye 4 (four) times daily as needed (dryness.).    Historical Provider, MD  traMADol (ULTRAM) 50 MG tablet Take 1 tablet (50 mg total) by mouth every 6 (six) hours as needed. Patient not taking: Reported on 11/01/2015 09/26/15   Ashley Murrain, NP  trimethoprim-polymyxin b (POLYTRIM) ophthalmic solution Place 1 drop into both eyes every 4 (four) hours.    Historical Provider, MD   BP 126/91 mmHg  Pulse 116  Temp(Src) 98.9 F (37.2 C) (Oral)  Resp 18  SpO2 98%  LMP 12/21/2015 (Approximate) Physical Exam  Constitutional: She appears well-developed. She appears distressed.  HENT:  Head: Normocephalic and atraumatic.  Eyes: Pupils are equal, round, and reactive to light.  Cardiovascular: Regular rhythm.  Tachycardia present.   Pulmonary/Chest: Effort normal and breath sounds normal.  Abdominal: Soft. Bowel sounds are normal. There is no tenderness. There is no guarding.  Genitourinary:  There is a large 5x3cm fluctuant  abscess in the right buttocks. Exceedingly tender to palpation. No sign of drainage.  There are also 2 ulcers on the left perineum near the labia. The patient denies any knowledge of what this is.     ED Course  Procedures (including critical care time) Labs Review Labs Reviewed  CBC WITH DIFFERENTIAL/PLATELET - Abnormal; Notable for the following:    WBC 2.9 (*)    RBC 3.76 (*)    Hemoglobin 8.9 (*)    HCT 28.6 (*)    MCV 76.1 (*)    MCH 23.7 (*)    RDW 17.6 (*)    Platelets 114 (*)    Lymphs Abs 0.6 (*)    All other components within normal limits  COMPREHENSIVE METABOLIC PANEL - Abnormal; Notable for the following:    Potassium 3.0 (*)  CO2 21 (*)    Calcium 8.2 (*)    Albumin 3.2 (*)    AST 81 (*)    All other components within normal limits  POC URINE PREG, ED    Imaging Review Ct Abdomen Pelvis W Contrast  01/09/2016  CLINICAL DATA:  Right buttocks abscess. EXAM: CT ABDOMEN AND PELVIS WITH CONTRAST TECHNIQUE: Multidetector CT imaging of the abdomen and pelvis was performed using the standard protocol following bolus administration of intravenous contrast. CONTRAST:  192mL ISOVUE-300 IOPAMIDOL (ISOVUE-300) INJECTION 61% COMPARISON:  None. FINDINGS: Lower chest and abdominal wall: Ovoid rim enhancing fluid collection in the left labia measuring 15 by 6 mm. There is surrounding cellulitis. 60 x 25 mm fluid collection in the right buttocks distorting the gluteal cleft. Rim enhancement is less well-defined but this still to be liquifaction consistent with at least partial abscess. Neighboring cellulitis. There is focal cellulitic change over the right lower abdominal wall. No soft tissue emphysema. Hepatobiliary: No focal liver abnormality.No evidence of biliary obstruction or stone. Pancreas: Unremarkable. Spleen: Unremarkable. Adrenals/Urinary Tract: Negative adrenals. Small nonobstructing right renal calculi, at least 3, measuring up to 4 mm. At least 2 punctate left renal  calculi. No hydronephrosis or ureteral calculus . There is a crescentic shaped fluid collection right and ventral to the lower urethra, measuring up to 13 by 6 mm. Stomach/Bowel:  No obstruction. No appendicitis. Reproductive:Negative uterus and ovaries. Vascular/Lymphatic: No acute vascular abnormality. Bilateral inguinal and external chain iliac adenopathy, presumably reactive given the pelvic findings. Other: No ascites or pneumoperitoneum. Musculoskeletal: No acute abnormalities. IMPRESSION: 1. 6 x 3 cm right buttocks abscess and phlegmon.No soft tissue emphysema or supralevator involvement. 2. 15 x 6 mm left labia abscess. 3. Urethral diverticulum. 4. Bilateral nephrolithiasis. 5. Inguinal and iliac adenopathy presumably reactive to #1 and 2. Recommend clinical follow-up. Electronically Signed   By: Monte Fantasia M.D.   On: 01/09/2016 22:22   I have personally reviewed and evaluated these images and lab results as part of my medical decision-making.   EKG Interpretation None      MDM   Final diagnoses:  None    CT abdomen/pelvis w/ contrast ordered to evaluate extent of abscess.  Patient given IV Cipro and Flagyl. Also given 1mg  Dilaudid for pain. CBC and CMP reviewed.  CT scan revealed a 6x3cm abscess on right buttocks without soft tissue emphysema. Also present is a 15x39mm left labial abscess.  Case discussed with Dr. Redmond Pulling with general surgery and will see the patient as a consult. Medicine agreed to admission. Lactate ordered.    Oliana Gowens, DO 01/09/16 2325  Tkai Large, DO 01/09/16 2339

## 2016-01-10 ENCOUNTER — Inpatient Hospital Stay (HOSPITAL_COMMUNITY): Payer: Medicaid Other | Admitting: Certified Registered Nurse Anesthetist

## 2016-01-10 ENCOUNTER — Encounter (HOSPITAL_COMMUNITY)
Admission: EM | Disposition: A | Discharge status: Home Health | Payer: Self-pay | Source: Home / Self Care | Attending: Family Medicine

## 2016-01-10 ENCOUNTER — Encounter (HOSPITAL_COMMUNITY): Payer: Self-pay | Admitting: Certified Registered Nurse Anesthetist

## 2016-01-10 ENCOUNTER — Ambulatory Visit: Payer: Self-pay | Admitting: *Deleted

## 2016-01-10 DIAGNOSIS — B20 Human immunodeficiency virus [HIV] disease: Secondary | ICD-10-CM

## 2016-01-10 DIAGNOSIS — D61818 Other pancytopenia: Secondary | ICD-10-CM

## 2016-01-10 DIAGNOSIS — J4520 Mild intermittent asthma, uncomplicated: Secondary | ICD-10-CM

## 2016-01-10 DIAGNOSIS — E876 Hypokalemia: Secondary | ICD-10-CM

## 2016-01-10 DIAGNOSIS — L0231 Cutaneous abscess of buttock: Principal | ICD-10-CM

## 2016-01-10 HISTORY — PX: INCISION AND DRAINAGE ABSCESS: SHX5864

## 2016-01-10 LAB — CBC
HEMATOCRIT: 29.6 % — AB (ref 36.0–46.0)
HEMOGLOBIN: 9 g/dL — AB (ref 12.0–15.0)
MCH: 23.5 pg — AB (ref 26.0–34.0)
MCHC: 30.4 g/dL (ref 30.0–36.0)
MCV: 77.3 fL — AB (ref 78.0–100.0)
Platelets: 113 10*3/uL — ABNORMAL LOW (ref 150–400)
RBC: 3.83 MIL/uL — ABNORMAL LOW (ref 3.87–5.11)
RDW: 17.2 % — AB (ref 11.5–15.5)
WBC: 2.9 10*3/uL — ABNORMAL LOW (ref 4.0–10.5)

## 2016-01-10 LAB — COMPLETE METABOLIC PANEL WITH GFR
ALBUMIN: 3.2 g/dL — AB (ref 3.6–5.1)
ALK PHOS: 105 U/L (ref 33–115)
ALT: 27 U/L (ref 6–29)
AST: 71 U/L — ABNORMAL HIGH (ref 10–30)
BUN: 12 mg/dL (ref 7–25)
CALCIUM: 7.9 mg/dL — AB (ref 8.6–10.2)
CO2: 19 mmol/L — ABNORMAL LOW (ref 20–31)
Chloride: 105 mmol/L (ref 98–110)
Creat: 0.84 mg/dL (ref 0.50–1.10)
GFR, Est African American: >89 mL/min (ref >=60)
GLUCOSE: 76 mg/dL (ref 65–99)
POTASSIUM: 3.6 mmol/L (ref 3.5–5.3)
Sodium: 135 mmol/L (ref 135–146)
TOTAL PROTEIN: 8.2 g/dL — AB (ref 6.1–8.1)
Total Bilirubin: 0.6 mg/dL (ref 0.2–1.2)

## 2016-01-10 LAB — BASIC METABOLIC PANEL
ANION GAP: 6 (ref 5–15)
BUN: 13 mg/dL (ref 6–20)
CHLORIDE: 105 mmol/L (ref 101–111)
CO2: 22 mmol/L (ref 22–32)
Calcium: 7.8 mg/dL — ABNORMAL LOW (ref 8.9–10.3)
Creatinine, Ser: 0.79 mg/dL (ref 0.44–1.00)
GFR calc Af Amer: >60 mL/min (ref >60)
GFR calc non Af Amer: >60 mL/min (ref >60)
GLUCOSE: 97 mg/dL (ref 65–99)
POTASSIUM: 3.3 mmol/L — AB (ref 3.5–5.1)
Sodium: 133 mmol/L — ABNORMAL LOW (ref 135–145)

## 2016-01-10 LAB — SURGICAL PCR SCREEN
MRSA, PCR: NEGATIVE
Staphylococcus aureus: NEGATIVE

## 2016-01-10 LAB — IRON AND TIBC
IRON: 20 ug/dL — AB (ref 28–170)
Saturation Ratios: 6 % — ABNORMAL LOW (ref 10.4–31.8)
TIBC: 325 ug/dL (ref 250–450)
UIBC: 305 ug/dL

## 2016-01-10 LAB — T-HELPER CELL (CD4) - (RCID CLINIC ONLY)

## 2016-01-10 LAB — FERRITIN: Ferritin: 97 ng/mL (ref 11–307)

## 2016-01-10 LAB — HIV-1 RNA QUANT-NO REFLEX-BLD
HIV 1 RNA Quant: 338281 copies/mL — ABNORMAL HIGH (ref <20)
HIV-1 RNA Quant, Log: 5.53 Log copies/mL — ABNORMAL HIGH (ref <1.30)

## 2016-01-10 LAB — MAGNESIUM: MAGNESIUM: 1.6 mg/dL — AB (ref 1.7–2.4)

## 2016-01-10 SURGERY — EXAM UNDER ANESTHESIA
Anesthesia: General | Site: Buttocks | Laterality: Right

## 2016-01-10 MED ORDER — FENTANYL CITRATE (PF) 100 MCG/2ML IJ SOLN
INTRAMUSCULAR | Status: DC | PRN
  Administered 2016-01-10 (×3): 100 ug via INTRAVENOUS
  Administered 2016-01-10: 50 ug via INTRAVENOUS

## 2016-01-10 MED ORDER — LIDOCAINE HCL (CARDIAC) 20 MG/ML IV SOLN
INTRAVENOUS | Status: AC
  Filled 2016-01-10: qty 5

## 2016-01-10 MED ORDER — PHENYLEPHRINE HCL 10 MG/ML IJ SOLN
INTRAMUSCULAR | Status: DC | PRN
  Administered 2016-01-10: 80 ug via INTRAVENOUS
  Administered 2016-01-10 (×2): 120 ug via INTRAVENOUS
  Administered 2016-01-10: 80 ug via INTRAVENOUS
  Administered 2016-01-10: 120 ug via INTRAVENOUS
  Administered 2016-01-10: 80 ug via INTRAVENOUS

## 2016-01-10 MED ORDER — MIDAZOLAM HCL 5 MG/5ML IJ SOLN
INTRAMUSCULAR | Status: DC | PRN
  Administered 2016-01-10: 2 mg via INTRAVENOUS

## 2016-01-10 MED ORDER — ACETAMINOPHEN 325 MG PO TABS: 650.0000 mg | ORAL_TABLET | Freq: Four times a day (QID) | ORAL | Status: DC | PRN

## 2016-01-10 MED ORDER — BUPIVACAINE-EPINEPHRINE 0.5% -1:200000 IJ SOLN
INTRAMUSCULAR | Status: DC | PRN
  Administered 2016-01-10: 10 mL
  Administered 2016-01-10: 30 mL

## 2016-01-10 MED ORDER — MEPERIDINE HCL 50 MG/ML IJ SOLN: 6.2500 mg | INTRAMUSCULAR | Status: DC | PRN

## 2016-01-10 MED ORDER — VANCOMYCIN HCL IN DEXTROSE 750-5 MG/150ML-% IV SOLN
750.0000 mg | Freq: Two times a day (BID) | INTRAVENOUS | Status: AC
  Administered 2016-01-10 – 2016-01-11 (×2): 750 mg via INTRAVENOUS
  Filled 2016-01-10 (×2): qty 150

## 2016-01-10 MED ORDER — 0.9 % SODIUM CHLORIDE (POUR BTL) OPTIME
TOPICAL | Status: DC | PRN
  Administered 2016-01-10: 1000 mL

## 2016-01-10 MED ORDER — PROPOFOL 10 MG/ML IV BOLUS
INTRAVENOUS | Status: DC | PRN
  Administered 2016-01-10: 160 mg via INTRAVENOUS

## 2016-01-10 MED ORDER — DEXTROSE 5 % IV SOLN
2.0000 g | Freq: Two times a day (BID) | INTRAVENOUS | Status: AC
  Administered 2016-01-10 (×3): 2 g via INTRAVENOUS
  Filled 2016-01-10 (×3): qty 2

## 2016-01-10 MED ORDER — ONDANSETRON HCL 4 MG PO TABS: 4.0000 mg | ORAL_TABLET | Freq: Four times a day (QID) | ORAL | Status: DC | PRN

## 2016-01-10 MED ORDER — DOLUTEGRAVIR SODIUM 50 MG PO TABS
50.0000 mg | ORAL_TABLET | Freq: Every day | ORAL | Status: DC
  Administered 2016-01-10 – 2016-01-12 (×3): 50 mg via ORAL
  Filled 2016-01-10 (×3): qty 1

## 2016-01-10 MED ORDER — POTASSIUM CHLORIDE IN NACL 40-0.9 MEQ/L-% IV SOLN
INTRAVENOUS | Status: AC
  Administered 2016-01-10 (×3): 125 mL/h via INTRAVENOUS
  Filled 2016-01-10 (×3): qty 1000

## 2016-01-10 MED ORDER — PHENYLEPHRINE 40 MCG/ML (10ML) SYRINGE FOR IV PUSH (FOR BLOOD PRESSURE SUPPORT)
PREFILLED_SYRINGE | INTRAVENOUS | Status: AC
  Filled 2016-01-10: qty 20

## 2016-01-10 MED ORDER — FERROUS SULFATE 325 (65 FE) MG PO TABS
325.0000 mg | ORAL_TABLET | Freq: Two times a day (BID) | ORAL | Status: DC
  Administered 2016-01-10 – 2016-01-12 (×4): 325 mg via ORAL
  Filled 2016-01-10 (×4): qty 1

## 2016-01-10 MED ORDER — SUCCINYLCHOLINE CHLORIDE 20 MG/ML IJ SOLN
INTRAMUSCULAR | Status: DC | PRN
  Administered 2016-01-10: 80 mg via INTRAVENOUS

## 2016-01-10 MED ORDER — FENTANYL CITRATE (PF) 100 MCG/2ML IJ SOLN
INTRAMUSCULAR | Status: AC
  Filled 2016-01-10: qty 2

## 2016-01-10 MED ORDER — LACTATED RINGERS IV SOLN
INTRAVENOUS | Status: DC | PRN
  Administered 2016-01-10 (×2): via INTRAVENOUS

## 2016-01-10 MED ORDER — FENTANYL CITRATE (PF) 250 MCG/5ML IJ SOLN
INTRAMUSCULAR | Status: AC
  Filled 2016-01-10: qty 5

## 2016-01-10 MED ORDER — METOCLOPRAMIDE HCL 5 MG/ML IJ SOLN: 10.0000 mg | Freq: Once | INTRAMUSCULAR | Status: DC | PRN

## 2016-01-10 MED ORDER — POTASSIUM CHLORIDE CRYS ER 20 MEQ PO TBCR
20.0000 meq | EXTENDED_RELEASE_TABLET | Freq: Three times a day (TID) | ORAL | Status: AC
Start: 2016-01-10 — End: 2016-01-11
  Administered 2016-01-10 – 2016-01-11 (×3): 20 meq via ORAL
  Filled 2016-01-10 (×3): qty 1

## 2016-01-10 MED ORDER — EMTRICITABINE-TENOFOVIR AF 200-25 MG PO TABS
1.0000 | ORAL_TABLET | Freq: Every day | ORAL | Status: DC
  Administered 2016-01-11 – 2016-01-12 (×2): 1 via ORAL
  Filled 2016-01-10 (×3): qty 1

## 2016-01-10 MED ORDER — EPHEDRINE SULFATE 50 MG/ML IJ SOLN
INTRAMUSCULAR | Status: DC | PRN
  Administered 2016-01-10: 10 mg via INTRAVENOUS

## 2016-01-10 MED ORDER — DOXYCYCLINE HYCLATE 100 MG PO TABS
100.0000 mg | ORAL_TABLET | Freq: Two times a day (BID) | ORAL | Status: DC
  Administered 2016-01-10 – 2016-01-11 (×2): 100 mg via ORAL
  Filled 2016-01-10 (×2): qty 1

## 2016-01-10 MED ORDER — ONDANSETRON HCL 4 MG/2ML IJ SOLN
4.0000 mg | Freq: Four times a day (QID) | INTRAMUSCULAR | Status: DC | PRN
  Administered 2016-01-10 – 2016-01-11 (×3): 4 mg via INTRAVENOUS
  Filled 2016-01-10 (×2): qty 2

## 2016-01-10 MED ORDER — PROPOFOL 10 MG/ML IV BOLUS
INTRAVENOUS | Status: AC
  Filled 2016-01-10: qty 20

## 2016-01-10 MED ORDER — METRONIDAZOLE IN NACL 5-0.79 MG/ML-% IV SOLN: 500.0000 mg | Freq: Three times a day (TID) | INTRAVENOUS | Status: DC

## 2016-01-10 MED ORDER — MIDAZOLAM HCL 2 MG/2ML IJ SOLN
INTRAMUSCULAR | Status: AC
  Filled 2016-01-10: qty 2

## 2016-01-10 MED ORDER — ONDANSETRON HCL 4 MG/2ML IJ SOLN
INTRAMUSCULAR | Status: AC
  Filled 2016-01-10: qty 2

## 2016-01-10 MED ORDER — OXYCODONE HCL 5 MG PO TABS
5.0000 mg | ORAL_TABLET | ORAL | Status: DC | PRN
  Administered 2016-01-10 – 2016-01-12 (×7): 5 mg via ORAL
  Filled 2016-01-10 (×7): qty 1

## 2016-01-10 MED ORDER — BUPIVACAINE-EPINEPHRINE 0.25% -1:200000 IJ SOLN
INTRAMUSCULAR | Status: AC
Start: 2016-01-10 — End: 2016-01-10
  Filled 2016-01-10: qty 1

## 2016-01-10 MED ORDER — ACETAMINOPHEN 650 MG RE SUPP
650.0000 mg | Freq: Four times a day (QID) | RECTAL | Status: DC | PRN
Start: 2016-01-10 — End: 2016-01-12

## 2016-01-10 MED ORDER — ALBUTEROL SULFATE (2.5 MG/3ML) 0.083% IN NEBU: 2.5000 mg | INHALATION_SOLUTION | RESPIRATORY_TRACT | Status: DC | PRN

## 2016-01-10 MED ORDER — LIDOCAINE HCL (CARDIAC) 20 MG/ML IV SOLN
INTRAVENOUS | Status: DC | PRN
  Administered 2016-01-10: 80 mg via INTRAVENOUS

## 2016-01-10 MED ORDER — FENTANYL CITRATE (PF) 100 MCG/2ML IJ SOLN: 25.0000 ug | INTRAMUSCULAR | Status: DC | PRN

## 2016-01-10 SURGICAL SUPPLY — 44 items
BLADE HEX COATED 2.75 (ELECTRODE) ×4 IMPLANT
BLADE SURG 15 STRL LF DISP TIS (BLADE) ×2 IMPLANT
BLADE SURG 15 STRL SS (BLADE) ×4
BNDG GAUZE ELAST 4 BULKY (GAUZE/BANDAGES/DRESSINGS) IMPLANT
COVER SURGICAL LIGHT HANDLE (MISCELLANEOUS) ×4 IMPLANT
DECANTER SPIKE VIAL GLASS SM (MISCELLANEOUS) ×4 IMPLANT
DRAIN PENROSE 18X1/2 LTX STRL (DRAIN) IMPLANT
DRAPE LAPAROSCOPIC ABDOMINAL (DRAPES) IMPLANT
DRSG PAD ABDOMINAL 8X10 ST (GAUZE/BANDAGES/DRESSINGS) ×2 IMPLANT
ELECT PENCIL ROCKER SW 15FT (MISCELLANEOUS) ×4 IMPLANT
ELECT REM PT RETURN 9FT ADLT (ELECTROSURGICAL) ×4
ELECTRODE REM PT RTRN 9FT ADLT (ELECTROSURGICAL) ×2 IMPLANT
GAUZE SPONGE 4X4 12PLY STRL (GAUZE/BANDAGES/DRESSINGS) ×6 IMPLANT
GAUZE SPONGE 4X4 16PLY XRAY LF (GAUZE/BANDAGES/DRESSINGS) ×4 IMPLANT
GLOVE BIO SURGEON STRL SZ7.5 (GLOVE) ×4 IMPLANT
GLOVE BIOGEL PI IND STRL 7.0 (GLOVE) ×2 IMPLANT
GLOVE BIOGEL PI INDICATOR 7.0 (GLOVE) ×2
GLOVE ECLIPSE 7.5 STRL STRAW (GLOVE) ×4 IMPLANT
GOWN STRL REUS W/TWL LRG LVL3 (GOWN DISPOSABLE) ×4 IMPLANT
GOWN STRL REUS W/TWL XL LVL3 (GOWN DISPOSABLE) ×8 IMPLANT
KIT BASIN OR (CUSTOM PROCEDURE TRAY) ×4 IMPLANT
LUBRICANT JELLY K Y 4OZ (MISCELLANEOUS) ×4 IMPLANT
NDL HYPO 25X1 1.5 SAFETY (NEEDLE) ×2 IMPLANT
NDL SAFETY ECLIPSE 18X1.5 (NEEDLE) ×2 IMPLANT
NEEDLE HYPO 18GX1.5 SHARP (NEEDLE) ×4
NEEDLE HYPO 25X1 1.5 SAFETY (NEEDLE) ×4 IMPLANT
NS IRRIG 1000ML POUR BTL (IV SOLUTION) ×4 IMPLANT
PACK GENERAL/GYN (CUSTOM PROCEDURE TRAY) ×4 IMPLANT
PACK LITHOTOMY IV (CUSTOM PROCEDURE TRAY) IMPLANT
SPONGE LAP 18X18 X RAY DECT (DISPOSABLE) IMPLANT
SPONGE SURGIFOAM ABS GEL 12-7 (HEMOSTASIS) IMPLANT
SUT CHROMIC 2 0 SH (SUTURE) IMPLANT
SUT CHROMIC 3 0 SH 27 (SUTURE) IMPLANT
SUT MNCRL AB 4-0 PS2 18 (SUTURE) IMPLANT
SUT VIC AB 3-0 SH 27 (SUTURE)
SUT VIC AB 3-0 SH 27XBRD (SUTURE) IMPLANT
SWAB COLLECTION DEVICE MRSA (MISCELLANEOUS) IMPLANT
SWAB CULTURE ESWAB REG 1ML (MISCELLANEOUS) IMPLANT
SYR CONTROL 10ML LL (SYRINGE) ×4 IMPLANT
TAPE CLOTH SURG 4X10 WHT LF (GAUZE/BANDAGES/DRESSINGS) ×2 IMPLANT
TOWEL OR 17X26 10 PK STRL BLUE (TOWEL DISPOSABLE) ×4 IMPLANT
TOWEL OR NON WOVEN STRL DISP B (DISPOSABLE) IMPLANT
UNDERPAD 30X30 INCONTINENT (UNDERPADS AND DIAPERS) ×4 IMPLANT
YANKAUER SUCT BULB TIP 10FT TU (MISCELLANEOUS) ×4 IMPLANT

## 2016-01-10 NOTE — Progress Notes (Signed)
Pharmacy Antibiotic Note  Beverly [name] is a 35 y.o. female admitted on 01/09/2016 with Buttock abscess in HIV patient.  Pharmacy has been consulted for Vancomycin, cefepime  dosing.  Plan: Vancomycin 750mg  IV every 12 hours.  Goal trough 15-20 mcg/mL.  Cefepime 2gm iv q12hr  Height: 5\' 1"  (154.9 cm) Weight: 145 lb (65.772 kg) IBW/kg (Calculated) : 47.8  Temp (24hrs), Avg:98.6 F (37 C), Min:98.2 F (36.8 C), Max:98.9 F (37.2 C)   Recent Labs Lab 01/09/16 1622 01/09/16 2014 01/09/16 2322  WBC 2.9* 2.9*  --   CREATININE 0.84 0.88  --   LATICACIDVEN  --   --  0.9    Estimated Creatinine Clearance: 78.9 mL/min (by C-G formula based on Cr of 0.88).    Allergies  Allergen Reactions  . Bee Venom Anaphylaxis, Shortness Of Breath and Swelling  . Morphine And Related Hives and Itching    Just can't take "morphine", vicodin is OK    Antimicrobials this admission: Vancomycin 01/09/2016 >> Cefepime 01/10/2016 >>   Dose adjustments this admission: -  Microbiology results: pending  Thank you for allowing pharmacy to be a part of this patient's care.  Nani Skillern Crowford 01/10/2016 12:46 AM

## 2016-01-10 NOTE — Anesthesia Postprocedure Evaluation (Signed)
Anesthesia Post Note  Patient: Beverly [name]  Procedure(s) Performed: Procedure(s) (LRB): EXAM UNDER ANESTHESIA (N/A) INCISION AND DRAINAGE RIGHT BUTTOCK, LEFT LABIAL , EXCISION AND DRAINAGE OF  RIGHT AXILLARY ABSCESS (Right)  Patient location during evaluation: PACU Anesthesia Type: General Level of consciousness: awake and alert and oriented Pain management: pain level controlled Vital Signs Assessment: post-procedure vital signs reviewed and stable Respiratory status: spontaneous breathing, nonlabored ventilation and respiratory function stable Cardiovascular status: blood pressure returned to baseline and stable Postop Assessment: no signs of nausea or vomiting Anesthetic complications: no    Last Vitals:  Filed Vitals:   01/10/16 0945 01/10/16 1000  BP: 110/68 107/69  Pulse: 77 76  Temp:  36.7 C  Resp: 18 16    Last Pain:  Filed Vitals:   01/10/16 1002  PainSc: 0-No pain                 Javon Snee A.

## 2016-01-10 NOTE — Anesthesia Preprocedure Evaluation (Addendum)
Anesthesia Evaluation    Airway Mallampati: III  TM Distance: >3 FB Neck ROM: Full    Dental no notable dental hx. (+) Teeth Intact   Pulmonary asthma , Current Smoker,    Pulmonary exam normal breath sounds clear to auscultation       Cardiovascular negative cardio ROS Normal cardiovascular exam Rhythm:Regular Rate:Normal     Neuro/Psych PSYCHIATRIC DISORDERS Anxiety Depression    GI/Hepatic Elevated LFT's   Endo/Other  negative endocrine ROS  Renal/GU negative Renal ROS  negative genitourinary   Musculoskeletal Abscess buttock, labia, axilla   Abdominal   Peds  Hematology  (+) anemia , HIV (+) Thrombocytopenia   Anesthesia Other Findings   Reproductive/Obstetrics                            Anesthesia Physical Anesthesia Plan  ASA: III  Anesthesia Plan: General   Post-op Pain Management:    Induction: Intravenous  Airway Management Planned: LMA and Oral ETT  Additional Equipment:   Intra-op Plan:   Post-operative Plan: Extubation in OR  Informed Consent: I have reviewed the patients History and Physical, chart, labs and discussed the procedure including the risks, benefits and alternatives for the proposed anesthesia with the patient or authorized representative who has indicated his/her understanding and acceptance.   Dental advisory given  Plan Discussed with: Anesthesiologist, CRNA and Surgeon  Anesthesia Plan Comments:        Anesthesia Quick Evaluation

## 2016-01-10 NOTE — Anesthesia Procedure Notes (Signed)
Procedure Name: Intubation Performed by: Merci Walthers J Pre-anesthesia Checklist: Patient identified, Emergency Drugs available, Suction available, Patient being monitored and Timeout performed Patient Re-evaluated:Patient Re-evaluated prior to inductionOxygen Delivery Method: Circle system utilized Preoxygenation: Pre-oxygenation with 100% oxygen Intubation Type: IV induction Ventilation: Mask ventilation without difficulty Laryngoscope Size: Mac and 3 Grade View: Grade I Tube type: Oral Tube size: 7.0 mm Number of attempts: 1 Airway Equipment and Method: Stylet Placement Confirmation: ETT inserted through vocal cords under direct vision,  positive ETCO2,  CO2 detector and breath sounds checked- equal and bilateral Secured at: 21 cm Tube secured with: Tape Dental Injury: Teeth and Oropharynx as per pre-operative assessment        

## 2016-01-10 NOTE — Op Note (Signed)
Preoperative Diagnosis: multiple abscesses, right buttock,  Right axilla, left labial  Postoprative Diagnosis: Same  Procedure: Procedure(s): Incision, drainage, and debridement  Of multiple abscesses as above   Surgeon: Excell Seltzer T   Assistants: none  Anesthesia:  General endotracheal anesthesia  Indications:  patient is a 35 year old female with HIV/AIDS and history of multiple soft tissue infections. She presents with at least a one-week history of discomfort and swelling in her right buttock, right axilla. CT of the abdomen and pelvis shows a large right buttock abscess and a left labial abscess. Exam shows an approximately 6 cm  Tender fluctuant area in the right axilla as well. We have recommended proceeding with incision and drainage and debridement of these areas under general anesthesia. The procedure was discussed with the patient including indications in nature and recovery and risks and she is in agreement.    Procedure Detail:  Patient was brought to the operating room, placed in the supine position on the operating table, and general endotracheal anesthesia was induced. She was already on IV antibiotics. She was positioned with her right arm extended and the right axilla sterilely prepped and draped. Patient timeout was performed and correct procedure verified. There was an approximately 5-6 cm area  Of discrete fluctuance. This was sharply incised and a large amount of thick purulent material drained. This was cultured.The cavity was thoroughly irrigated and all loculations broken up. The soft tissue was infiltrated with local anesthetic and the wound packed with 1/4 inch iodoform gauze. Dry sterile dressing was applied.  Patient was then repositioned in lithotomy and the perineum and buttocks widely sterilely prepped and draped.Patient timeout was again performed. In the left labia was an area of induration and slight fluctuancewith 3 sinus tracts working anterior to  posterior with purulent drainage.The 2 more anterior sinus tracts communicated with probing to a chronic abscess cavity. These 2 sinus tracts and the abscess cavity were then sharply debrided with knife and cautery down into the subcutaneous tissue removing an area approximately  2 x 1 x 1 cm of skin and subcutaneous tissue.  This was then found to communicate with a subcutaneous tract to the third sinus tract more posteriorly in the sinus tract was opened up and debrided again sharply and with cautery excising skin and subcutaneous tissue over an area of about 2 x 0.5 x 0.5 cm. Hemostasis was obtained with cautery.  These wounds were packed with moist saline gauze.  In the right medial buttock with a large area of firmness and central fluctuance. This was sharply incised for about 4 cm and a large abscess cavity entered and purulent material drained. This was cultured. All loculations were broken up and the wound was thoroughly irrigated. This was a large abscess cavity measuring at least 5-6 cm. Hemostasis was obtained and this was packed with iodoform gauze. Dry sterile dressings were applied.  Sponge needle and instrument counts were correct.    Findings: As above  Estimated Blood Loss:  less than 50 mL         Drains: wounds packed open as described above  Blood Given: none          Specimens: #1culture and sensitivity right axilla     #2  Skin and subcutaneous tissue right labium and perineum    #3 culture and sensitivity right buttock abscess        Complications:  * No complications entered in OR log *         Disposition:  PACU - hemodynamically stable.         Condition: stable

## 2016-01-10 NOTE — Progress Notes (Signed)
PROGRESS NOTE    BELLAH SCHOLLENBERGER  [id number]  DOB: Nov 04, 1980  DOA: 01/09/2016 PCP: No primary care provider on file. Outpatient Specialists:  Hospital course:  HPI: Beverly [name] is a 35 y.o. 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  The patient was in her usual state of health until about 1-2 weeks ago when she was in jail for the weekend, started to develop an "ingrown hair" in her groin because of the uniform. Since returning home, the spot kept getting bigger and more tender, despite Sitz bath and warm compresses. In the last two days, she has had some nausea, chills and pain with sitting. Today she was at [facility] for lab work and asked a nurse to see her buttock, which was very tender on exam, so she was sent to the ER.   Assessment & Plan:   1. Abscesses of the right buttock, right axilla and left labia:  -Vancomycin and cefepime - de-escalate to orals tomorrow -Follow blood cultures -Consult to General surgery for I&D -Consult to ID, appreciate cares --Postop wound care per surgery   2. AIDS/HIV:  -Continue home [place] and [place] to ID - I spoke with Dr. Johnnye Sima will notify Kinnie Scales RN who monitors and contacts patient who have poor compliance and will get her follow up in their office after discharge. --HIV counts pending  3. Hypokalemia:  -MIVF with K -Check mag  4. Social instability, near homeless, poor medical follow up:  -Consult to social work for housing assistance, connect with [facility] to care management for home health needs  5. Depression:   6. 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 Possibly iron deficiency contributing.  -Check iron studies -Iron supplement for now -Agree with Gen Surg, transfusion threshold 7 g/dL  7. Asthma:  -Albuterol PRN  DVT prophylaxis: SCDs  Code Status: FULL  Family Communication: None present    Disposition Plan: Possible DC in 1-2 days Consults called: Gen Surg Admission status: INPATIENT, med surg  Consultants:  General surgery , ID phone consult   Procedures:  I&D 7/21  Antimicrobials: Anti-infectives    Start     Dose/Rate Route Frequency Ordered Stop   01/10/16 1000  dolutegravir (TIVICAY) tablet 50 mg     50 mg Oral Daily 01/10/16 0032     01/10/16 1000  emtricitabine-tenofovir AF (DESCOVY) 200-25 MG per tablet 1 tablet     1 tablet Oral Daily 01/10/16 0032     01/10/16 1000  vancomycin (VANCOCIN) IVPB 750 mg/150 ml premix     750 mg 150 mL/hr over 60 Minutes Intravenous Every 12 hours 01/10/16 0044     01/10/16 0600  metroNIDAZOLE (FLAGYL) IVPB 500 mg  Status:  Discontinued     500 mg 100 mL/hr over 60 Minutes Intravenous Every 8 hours 01/10/16 0032 01/10/16 0037   01/10/16 0045  ceFEPIme (MAXIPIME) 2 g in dextrose 5 % 50 mL IVPB     2 g 100 mL/hr over 30 Minutes Intravenous Every 12 hours 01/10/16 0042     01/09/16 2330  vancomycin (VANCOCIN) IVPB 1000 mg/200 mL premix     1,000 mg 200 mL/hr over 60 Minutes Intravenous  Once 01/09/16 2321 01/10/16 0050   01/09/16 2015  ciprofloxacin (CIPRO) IVPB 400 mg     400 mg 200 mL/hr over 60 Minutes Intravenous  Once 01/09/16 2007 01/09/16 2119   01/09/16 2015  metroNIDAZOLE (FLAGYL) IVPB 500  mg     500 mg 100 mL/hr over 60 Minutes Intravenous  Once 01/09/16 2007 01/09/16 2225      Subjective: Pt recovering from surgery this morning.   Objective: Filed Vitals:   01/10/16 1015 01/10/16 1031 01/10/16 1202 01/10/16 1234  BP: 115/98 95/61 89/51  101/65  Pulse: 70 71 76 70  Temp: 97.8 F (36.6 C) 97.8 F (36.6 C) 97.9 F (36.6 C)   TempSrc:   Oral   Resp: 16 18 15    Height:      Weight:      SpO2: 100% 100% 100% 100%    Intake/Output Summary (Last 24 hours) at 01/10/16 1438 Last data filed at 01/10/16 0903  Gross per 24 hour  Intake 1793.75 ml  Output     50 ml  Net 1743.75 ml   Filed Weights    01/09/16 2326 01/10/16 0040  Weight: 65.772 kg (145 lb) 65.772 kg (145 lb)    Exam:  General exam: awake, alert, no distress, cooperative.  Respiratory system: Clear. No increased work of breathing. Cardiovascular system: S1 & S2 heard, RRR. No JVD, murmurs, gallops, clicks or pedal edema. Gastrointestinal system: Abdomen is nondistended, soft and nontender. Normal bowel sounds heard. Central nervous system: Alert and oriented. No focal neurological deficits. Wounds: clean, dry.  Data Reviewed: Basic Metabolic Panel:  Recent Labs Lab 01/09/16 1622 01/09/16 2014 01/10/16 0127  NA 135 136 133*  K 3.6 3.0* 3.3*  CL 105 107 105  CO2 19* 21* 22  GLUCOSE 76 77 97  BUN 12 14 13   CREATININE 0.84 0.88 0.79  CALCIUM 7.9* 8.2* 7.8*  MG  --   --  1.6*   Liver Function Tests:  Recent Labs Lab 01/09/16 1622 01/09/16 2014  AST 71* 81*  ALT 27 32  ALKPHOS 105 101  BILITOT 0.6 0.7  PROT 8.2* 8.1  ALBUMIN 3.2* 3.2*   No results for input(s): LIPASE, AMYLASE in the last 168 hours. No results for input(s): AMMONIA in the last 168 hours. CBC:  Recent Labs Lab 01/09/16 1622 01/09/16 2014 01/10/16 0127  WBC 2.9* 2.9* 2.9*  NEUTROABS 1914 1.9  --   HGB 9.2* 8.9* 9.0*  HCT 29.0* 28.6* 29.6*  MCV 74.0* 76.1* 77.3*  PLT 153 114* 113*   Cardiac Enzymes: No results for input(s): CKTOTAL, CKMB, CKMBINDEX, TROPONINI in the last 168 hours. BNP (last 3 results) No results for input(s): PROBNP in the last 8760 hours. CBG: No results for input(s): GLUCAP in the last 168 hours.  Recent Results (from the past 240 hour(s))  Culture, blood (routine x 2)     Status: None (Preliminary result)   Collection Time: 01/09/16 11:52 PM  Result Value Ref Range Status   Specimen Description RIGHT ANTECUBITAL  Final   Special Requests BOTTLES DRAWN AEROBIC AND ANAEROBIC 5CC  Final   Culture   Final    NO GROWTH < 12 HOURS Performed at [facility]    Report Status PENDING  Incomplete   Culture, blood (routine x 2)     Status: None (Preliminary result)   Collection Time: 01/09/16 11:54 PM  Result Value Ref Range Status   Specimen Description LEFT ANTECUBITAL  Final   Special Requests BOTTLES DRAWN AEROBIC AND ANAEROBIC 5CC  Final   Culture   Final    NO GROWTH < 12 HOURS Performed at [facility]    Report Status PENDING  Incomplete  Surgical pcr screen     Status: None  Collection Time: 01/10/16  1:00 AM  Result Value Ref Range Status   MRSA, PCR NEGATIVE NEGATIVE Final   Staphylococcus aureus NEGATIVE NEGATIVE Final    Comment:        The Xpert SA Assay (FDA approved for NASAL specimens in patients over 86 years of age), is one component of a comprehensive surveillance program.  Test performance has been validated by [facility] for patients greater than or equal to 11 year old. It is not intended to diagnose infection nor to guide or monitor treatment.   Aerobic/Anaerobic Culture (surgical/deep wound)     Status: None (Preliminary result)   Collection Time: 01/10/16  8:47 AM  Result Value Ref Range Status   Specimen Description ABSCESS RT AXILLARY  Final   Special Requests NONE  Final   Gram Stain PENDING  Incomplete   Culture PENDING  Incomplete   Report Status PENDING  Incomplete     Studies: Ct Abdomen Pelvis W Contrast  01/09/2016  CLINICAL DATA:  Right buttocks abscess. EXAM: CT ABDOMEN AND PELVIS WITH CONTRAST TECHNIQUE: Multidetector CT imaging of the abdomen and pelvis was performed using the standard protocol following bolus administration of intravenous contrast. CONTRAST:  120mL ISOVUE-300 IOPAMIDOL (ISOVUE-300) INJECTION 61% COMPARISON:  None. FINDINGS: Lower chest and abdominal wall: Ovoid rim enhancing fluid collection in the left labia measuring 15 by 6 mm. There is surrounding cellulitis. 60 x 25 mm fluid collection in the right buttocks distorting the gluteal cleft. Rim enhancement is less well-defined but this still to be  liquifaction consistent with at least partial abscess. Neighboring cellulitis. There is focal cellulitic change over the right lower abdominal wall. No soft tissue emphysema. Hepatobiliary: No focal liver abnormality.No evidence of biliary obstruction or stone. Pancreas: Unremarkable. Spleen: Unremarkable. Adrenals/Urinary Tract: Negative adrenals. Small nonobstructing right renal calculi, at least 3, measuring up to 4 mm. At least 2 punctate left renal calculi. No hydronephrosis or ureteral calculus . There is a crescentic shaped fluid collection right and ventral to the lower urethra, measuring up to 13 by 6 mm. Stomach/Bowel:  No obstruction. No appendicitis. Reproductive:Negative uterus and ovaries. Vascular/Lymphatic: No acute vascular abnormality. Bilateral inguinal and external chain iliac adenopathy, presumably reactive given the pelvic findings. Other: No ascites or pneumoperitoneum. Musculoskeletal: No acute abnormalities. IMPRESSION: 1. 6 x 3 cm right buttocks abscess and phlegmon.No soft tissue emphysema or supralevator involvement. 2. 15 x 6 mm left labia abscess. 3. Urethral diverticulum. 4. Bilateral nephrolithiasis. 5. Inguinal and iliac adenopathy presumably reactive to #1 and 2. Recommend clinical follow-up. Electronically Signed   By: Monte Fantasia M.D.   On: 01/09/2016 22:22     Scheduled Meds: . ceFEPime (MAXIPIME) IV  2 g Intravenous Q12H  . dolutegravir  50 mg Oral Daily  . emtricitabine-tenofovir AF  1 tablet Oral Daily  . ferrous sulfate  325 mg Oral BID WC  . vancomycin  750 mg Intravenous Q12H   Continuous Infusions: . 0.9 % NaCl with KCl 40 mEq / L 125 mL/hr (01/10/16 1251)    Principal Problem:   Abscess of right buttock Active Problems:   Asthma   Hypokalemia   AIDS ([place])   Major depression, recurrent ([place])   Pancytopenia ([place])  Time spent:   Irwin Brakeman, MD, FAAFP [facility] Pager [phone]  If 7PM-7AM, please contact  night-coverage [email or web address] Password [email or web address] 01/10/2016, 2:38 PM    LOS: 1 day

## 2016-01-10 NOTE — Consult Note (Signed)
[facility] Nurse consulted for post op dressing changes.  Chart reviewed and this patient is going to OR for surgical debridement, surgery will most likely assume care and order wound care for the bedside nurses.  If hydrotherapy needed, will need to order this treatment through PT services.    Re consult if needed, will not follow at this time. Thanks  Angelena Sand [employer], RN,CNS, [place] [phone])

## 2016-01-10 NOTE — Transfer of Care (Signed)
Immediate Anesthesia Transfer of Care Note  Patient: Beverly [name]  Procedure(s) Performed: Procedure(s): EXAM UNDER ANESTHESIA (N/A) INCISION AND DRAINAGE RIGHT BUTTOCK, LEFT LABIAL , EXCISION AND DRAINAGE OF  RIGHT AXILLARY ABSCESS (Right)  Patient Location: PACU  Anesthesia Type:General  Level of Consciousness: awake, alert  and oriented  Airway & Oxygen Therapy: Patient Spontanous Breathing and Patient connected to face mask oxygen  Post-op Assessment: Report given to RN and Post -op Vital signs reviewed and stable  Post vital signs: Reviewed and stable  Last Vitals:  Filed Vitals:   01/10/16 0040 01/10/16 0550  BP: 115/82 97/62  Pulse: 70 74  Temp: 36.8 C 37.2 C  Resp: 18 18    Last Pain:  Filed Vitals:   01/10/16 0550  PainSc: Asleep      Patients Stated Pain Goal: 0 ([id number])  Complications: No apparent anesthesia complications

## 2016-01-11 DIAGNOSIS — B20 Human immunodeficiency virus [HIV] disease: Secondary | ICD-10-CM

## 2016-01-11 DIAGNOSIS — F331 Major depressive disorder, recurrent, moderate: Secondary | ICD-10-CM

## 2016-01-11 LAB — BASIC METABOLIC PANEL
Anion gap: 3 — ABNORMAL LOW (ref 5–15)
BUN: 9 mg/dL (ref 6–20)
CALCIUM: 7.7 mg/dL — AB (ref 8.9–10.3)
CO2: 22 mmol/L (ref 22–32)
CREATININE: 0.69 mg/dL (ref 0.44–1.00)
Chloride: 112 mmol/L — ABNORMAL HIGH (ref 101–111)
GFR calc Af Amer: >60 mL/min (ref >60)
GLUCOSE: 90 mg/dL (ref 65–99)
Potassium: 4.4 mmol/L (ref 3.5–5.1)
SODIUM: 137 mmol/L (ref 135–145)

## 2016-01-11 MED ORDER — DOXYCYCLINE HYCLATE 100 MG PO TABS
100.0000 mg | ORAL_TABLET | ORAL | Status: DC
  Administered 2016-01-12: 100 mg via ORAL
  Filled 2016-01-11 (×2): qty 1

## 2016-01-11 MED ORDER — VANCOMYCIN HCL IN DEXTROSE 750-5 MG/150ML-% IV SOLN
750.0000 mg | Freq: Two times a day (BID) | INTRAVENOUS | Status: AC
  Administered 2016-01-11 (×2): 750 mg via INTRAVENOUS
  Filled 2016-01-11 (×2): qty 150

## 2016-01-11 MED ORDER — DEXTROSE 5 % IV SOLN
2.0000 g | Freq: Two times a day (BID) | INTRAVENOUS | Status: DC
  Administered 2016-01-11 (×2): 2 g via INTRAVENOUS
  Filled 2016-01-11 (×3): qty 2

## 2016-01-11 NOTE — Progress Notes (Signed)
1 Day Post-Op  Subjective: Some soreness around site of right buttock wound.  Objective: Vital signs in last 24 hours: Temp:  [97.7 F (36.5 C)-98.2 F (36.8 C)] 98.2 F (36.8 C) (07/22 0500) Pulse Rate:  [52-76] 52 (07/22 0500) Resp:  [15-18] 16 (07/22 0500) BP: (89-109)/(39-65) 94/60 mmHg (07/22 0500) SpO2:  [100 %] 100 % (07/22 0500) Last BM Date: 01/09/16  Intake/Output from previous day: 07/21 0701 - 07/22 0700 In: 1400 [P.O.:400; I.V.:1000] Out: 450 [Urine:400; Blood:50] Intake/Output this shift: Total I/O In: 355 [P.O.:355] Out: -   PE: General- In NAD Right axillary wound looks clean Left labial wound looks clean Right buttock wound looks clean  Lab Results:   Recent Labs  01/09/16 2014 01/10/16 0127  WBC 2.9* 2.9*  HGB 8.9* 9.0*  HCT 28.6* 29.6*  PLT 114* 113*   BMET  Recent Labs  01/10/16 0127 01/11/16 0418  NA 133* 137  K 3.3* 4.4  CL 105 112*  CO2 22 22  GLUCOSE 97 90  BUN 13 9  CREATININE 0.79 0.69  CALCIUM 7.8* 7.7*   PT/INR No results for input(s): LABPROT, INR in the last 72 hours. Comprehensive Metabolic Panel:    Component Value Date/Time   NA 137 01/11/2016 0418   NA 133* 01/10/2016 0127   K 4.4 01/11/2016 0418   K 3.3* 01/10/2016 0127   CL 112* 01/11/2016 0418   CL 105 01/10/2016 0127   CO2 22 01/11/2016 0418   CO2 22 01/10/2016 0127   BUN 9 01/11/2016 0418   BUN 13 01/10/2016 0127   CREATININE 0.69 01/11/2016 0418   CREATININE 0.79 01/10/2016 0127   CREATININE 0.84 01/09/2016 1622   CREATININE 0.71 05/06/2015 1105   GLUCOSE 90 01/11/2016 0418   GLUCOSE 97 01/10/2016 0127   CALCIUM 7.7* 01/11/2016 0418   CALCIUM 7.8* 01/10/2016 0127   AST 81* 01/09/2016 2014   AST 71* 01/09/2016 1622   ALT 32 01/09/2016 2014   ALT 27 01/09/2016 1622   ALKPHOS 101 01/09/2016 2014   ALKPHOS 105 01/09/2016 1622   BILITOT 0.7 01/09/2016 2014   BILITOT 0.6 01/09/2016 1622   PROT 8.1 01/09/2016 2014   PROT 8.2* 01/09/2016 1622    ALBUMIN 3.2* 01/09/2016 2014   ALBUMIN 3.2* 01/09/2016 1622     Studies/Results: Ct Abdomen Pelvis W Contrast  01/09/2016  CLINICAL DATA:  Right buttocks abscess. EXAM: CT ABDOMEN AND PELVIS WITH CONTRAST TECHNIQUE: Multidetector CT imaging of the abdomen and pelvis was performed using the standard protocol following bolus administration of intravenous contrast. CONTRAST:  163mL ISOVUE-300 IOPAMIDOL (ISOVUE-300) INJECTION 61% COMPARISON:  None. FINDINGS: Lower chest and abdominal wall: Ovoid rim enhancing fluid collection in the left labia measuring 15 by 6 mm. There is surrounding cellulitis. 60 x 25 mm fluid collection in the right buttocks distorting the gluteal cleft. Rim enhancement is less well-defined but this still to be liquifaction consistent with at least partial abscess. Neighboring cellulitis. There is focal cellulitic change over the right lower abdominal wall. No soft tissue emphysema. Hepatobiliary: No focal liver abnormality.No evidence of biliary obstruction or stone. Pancreas: Unremarkable. Spleen: Unremarkable. Adrenals/Urinary Tract: Negative adrenals. Small nonobstructing right renal calculi, at least 3, measuring up to 4 mm. At least 2 punctate left renal calculi. No hydronephrosis or ureteral calculus . There is a crescentic shaped fluid collection right and ventral to the lower urethra, measuring up to 13 by 6 mm. Stomach/Bowel:  No obstruction. No appendicitis. Reproductive:Negative uterus and ovaries. Vascular/Lymphatic: No acute  vascular abnormality. Bilateral inguinal and external chain iliac adenopathy, presumably reactive given the pelvic findings. Other: No ascites or pneumoperitoneum. Musculoskeletal: No acute abnormalities. IMPRESSION: 1. 6 x 3 cm right buttocks abscess and phlegmon.No soft tissue emphysema or supralevator involvement. 2. 15 x 6 mm left labia abscess. 3. Urethral diverticulum. 4. Bilateral nephrolithiasis. 5. Inguinal and iliac adenopathy presumably reactive  to #1 and 2. Recommend clinical follow-up. Electronically Signed   By: Monte Fantasia M.D.   On: 01/09/2016 22:22    Anti-infectives: Anti-infectives    Start     Dose/Rate Route Frequency Ordered Stop   01/11/16 1000  doxycycline (VIBRA-TABS) tablet 100 mg     100 mg Oral Every 12 hours 01/10/16 1450     01/10/16 1000  dolutegravir (TIVICAY) tablet 50 mg     50 mg Oral Daily 01/10/16 0032     01/10/16 1000  emtricitabine-tenofovir AF (DESCOVY) 200-25 MG per tablet 1 tablet     1 tablet Oral Daily 01/10/16 0032     01/10/16 1000  vancomycin (VANCOCIN) IVPB 750 mg/150 ml premix     750 mg 150 mL/hr over 60 Minutes Intravenous Every 12 hours 01/10/16 0044 01/11/16 0112   01/10/16 0600  metroNIDAZOLE (FLAGYL) IVPB 500 mg  Status:  Discontinued     500 mg 100 mL/hr over 60 Minutes Intravenous Every 8 hours 01/10/16 0032 01/10/16 0037   01/10/16 0045  ceFEPIme (MAXIPIME) 2 g in dextrose 5 % 50 mL IVPB     2 g 100 mL/hr over 30 Minutes Intravenous Every 12 hours 01/10/16 0042 01/10/16 2303   01/09/16 2330  vancomycin (VANCOCIN) IVPB 1000 mg/200 mL premix     1,000 mg 200 mL/hr over 60 Minutes Intravenous  Once 01/09/16 2321 01/10/16 0050   01/09/16 2015  ciprofloxacin (CIPRO) IVPB 400 mg     400 mg 200 mL/hr over 60 Minutes Intravenous  Once 01/09/16 2007 01/09/16 2119   01/09/16 2015  metroNIDAZOLE (FLAGYL) IVPB 500 mg     500 mg 100 mL/hr over 60 Minutes Intravenous  Once 01/09/16 2007 01/09/16 2225      Assessment Principal Problem:   Abscesses of right buttock, left labia, right axilla s/p I & D (Dr. Excell Seltzer) 01/10/16-wounds look clean this AM; dressing changes started Active Problems:      Pancytopenia ([place])   AIDS (acquired immune deficiency syndrome) ([place])    LOS: 2 days   Plan: Continue abxs and wound care. Will need HHRN to help with dressing changes-orders placed.   Beverly [name] 01/11/2016

## 2016-01-11 NOTE — Progress Notes (Addendum)
PROGRESS NOTE    Beverly [name]  [id number]  DOB: 1981-03-29  DOA: 01/09/2016 PCP: No primary care provider on file. Outpatient Specialists:  Hospital course:  HPI: Beverly [name] is a 35 y.o. 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  The patient was in her usual state of health until about 1-2 weeks ago when she was in jail for the weekend, started to develop an "ingrown hair" in her groin because of the uniform. Since returning home, the spot kept getting bigger and more tender, despite Sitz bath and warm compresses. In the last two days, she has had some nausea, chills and pain with sitting. Today she was at [facility] for lab work and asked a nurse to see her buttock, which was very tender on exam, so she was sent to the ER.  Assessment & Plan:   1. Abscesses of the right buttock, right axilla and left labia:  -Continue IV Vancomycin and cefepime -blood cultures NGTD -General surgery performed I&D 7/21 -Consult to ID, appreciate cares --Postop wound care per surgery - ordered [facility] for dressing changes   2. AIDS/HIV:  -Continue home Tivicay and Descovy -Consult to ID - I spoke with Dr. Johnnye [name] will notify Beverly Scales RN who monitors and contacts patient who have poor compliance and will get her follow up in their office after discharge. HIV 1 RNA Quant <20 copies/mL 338281 (H)   HIV1 RNA Quant, Log <1.30 Log copies/mL 5.53 (H)      3. Hypokalemia:  -repleted, will follow -repleted Mg   4. Social instability, near homeless, poor medical follow up:  -Consult to social work for housing assistance, connect with Beverly [name] to care management for home health needs  5. Depression:   6. 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 Possibly iron deficiency contributing.  -Check iron studies -Iron supplement for now -Agree with Gen Surg, transfusion threshold 7 g/dL  7.  Asthma:  -Albuterol PRN  DVT prophylaxis: SCDs  Code Status: FULL  Family Communication: None present  Disposition Plan: Possible DC in 1-2 days Consults called: Gen Surg Admission status: INPATIENT, med surg  Consultants:  General surgery , ID phone consult   Procedures:  I&D 7/21  Antimicrobials: Anti-infectives    Start     Dose/Rate Route Frequency Ordered Stop   01/12/16 1200  doxycycline (VIBRA-TABS) tablet 100 mg     100 mg Oral 2 times per day 01/11/16 1208     01/12/16 1115  sulfamethoxazole-trimethoprim (BACTRIM DS,SEPTRA DS) 800-160 MG per tablet 1 tablet     1 tablet Oral Every 12 hours 01/12/16 1101     01/11/16 1300  ceFEPIme (MAXIPIME) 2 g in dextrose 5 % 50 mL IVPB  Status:  Discontinued     2 g 100 mL/hr over 30 Minutes Intravenous Every 12 hours 01/11/16 1155 01/12/16 0855   01/11/16 1230  vancomycin (VANCOCIN) IVPB 750 mg/150 ml premix     750 mg 150 mL/hr over 60 Minutes Intravenous Every 12 hours 01/11/16 1155 01/12/16 0013   01/11/16 1000  doxycycline (VIBRA-TABS) tablet 100 mg  Status:  Discontinued     100 mg Oral Every 12 hours 01/10/16 1450 01/11/16 1155   01/10/16 1000  dolutegravir (TIVICAY) tablet 50 mg     50 mg Oral Daily 01/10/16 0032     01/10/16 1000  emtricitabine-tenofovir AF (DESCOVY) 200-25 MG per tablet 1 tablet     1  tablet Oral Daily 01/10/16 0032     01/10/16 1000  vancomycin (VANCOCIN) IVPB 750 mg/150 ml premix     750 mg 150 mL/hr over 60 Minutes Intravenous Every 12 hours 01/10/16 0044 01/11/16 0112   01/10/16 0600  metroNIDAZOLE (FLAGYL) IVPB 500 mg  Status:  Discontinued     500 mg 100 mL/hr over 60 Minutes Intravenous Every 8 hours 01/10/16 0032 01/10/16 0037   01/10/16 0045  ceFEPIme (MAXIPIME) 2 g in dextrose 5 % 50 mL IVPB     2 g 100 mL/hr over 30 Minutes Intravenous Every 12 hours 01/10/16 0042 01/10/16 2303   01/09/16 2330  vancomycin (VANCOCIN) IVPB 1000 mg/200 mL premix     1,000 mg 200 mL/hr over 60 Minutes  Intravenous  Once 01/09/16 2321 01/10/16 0050   01/09/16 2015  ciprofloxacin (CIPRO) IVPB 400 mg     400 mg 200 mL/hr over 60 Minutes Intravenous  Once 01/09/16 2007 01/09/16 2119   01/09/16 2015  metroNIDAZOLE (FLAGYL) IVPB 500 mg     500 mg 100 mL/hr over 60 Minutes Intravenous  Once 01/09/16 2007 01/09/16 2225      Subjective: Pt reporting that she is nauseated but tolerating broth.   Objective: Vitals:   01/10/16 2210 01/11/16 0500 01/11/16 1535 01/12/16 0543  BP: (!) 109/39 94/60 109/61 97/69  Pulse: 63 (!) 52 70 74  Resp: 17 16 14 15   Temp: 97.7 F (36.5 C) 98.2 F (36.8 C) 97.9 F (36.6 C) 97.7 F (36.5 C)  TempSrc: Oral Oral Oral Oral  SpO2: 100% 100% 100% 100%  Weight:      Height:        Intake/Output Summary (Last 24 hours) at 01/12/16 1105 Last data filed at 01/12/16 1001  Gross per 24 hour  Intake             1572 ml  Output                0 ml  Net             1572 ml   Filed Weights   01/09/16 2326 01/10/16 0040  Weight: 65.8 kg (145 lb) 65.8 kg (145 lb)    Exam:  General exam: awake, alert, no distress, cooperative.  Respiratory system: Clear. No increased work of breathing. Cardiovascular system: S1 & S2 heard, RRR. No JVD, murmurs, gallops, clicks or pedal edema. Gastrointestinal system: Abdomen is nondistended, soft and nontender. Normal bowel sounds heard. Central nervous system: Alert and oriented. No focal neurological deficits. Wounds: clean, dry.  Data Reviewed: Basic Metabolic Panel:  Recent Labs Lab 01/09/16 1622 01/09/16 2014 01/10/16 0127 01/11/16 0418  NA 135 136 133* 137  K 3.6 3.0* 3.3* 4.4  CL 105 107 105 112*  CO2 19* 21* 22 22  GLUCOSE 76 77 97 90  BUN 12 14 13 9   CREATININE 0.84 0.88 0.79 0.69  CALCIUM 7.9* 8.2* 7.8* 7.7*  MG  --   --  1.6*  --    Liver Function Tests:  Recent Labs Lab 01/09/16 1622 01/09/16 2014  AST 71* 81*  ALT 27 32  ALKPHOS 105 101  BILITOT 0.6 0.7  PROT 8.2* 8.1  ALBUMIN 3.2*  3.2*   No results for input(s): LIPASE, AMYLASE in the last 168 hours. No results for input(s): AMMONIA in the last 168 hours. CBC:  Recent Labs Lab 01/09/16 1622 01/09/16 2014 01/10/16 0127  WBC 2.9* 2.9* 2.9*  NEUTROABS 1,914 1.9  --  HGB 9.2* 8.9* 9.0*  HCT 29.0* 28.6* 29.6*  MCV 74.0* 76.1* 77.3*  PLT 153 114* 113*   Cardiac Enzymes: No results for input(s): CKTOTAL, CKMB, CKMBINDEX, TROPONINI in the last 168 hours. BNP (last 3 results) No results for input(s): PROBNP in the last 8760 hours. CBG: No results for input(s): GLUCAP in the last 168 hours.  Recent Results (from the past 240 hour(s))  Culture, blood (routine x 2)     Status: None (Preliminary result)   Collection Time: 01/09/16 11:52 PM  Result Value Ref Range Status   Specimen Description RIGHT ANTECUBITAL  Final   Special Requests BOTTLES DRAWN AEROBIC AND ANAEROBIC 5CC  Final   Culture   Final    NO GROWTH 1 DAY Performed at [facility]    Report Status PENDING  Incomplete  Culture, blood (routine x 2)     Status: None (Preliminary result)   Collection Time: 01/09/16 11:54 PM  Result Value Ref Range Status   Specimen Description LEFT ANTECUBITAL  Final   Special Requests BOTTLES DRAWN AEROBIC AND ANAEROBIC 5CC  Final   Culture   Final    NO GROWTH 1 DAY Performed at [facility]    Report Status PENDING  Incomplete  Surgical pcr screen     Status: None   Collection Time: 01/10/16  1:00 AM  Result Value Ref Range Status   MRSA, PCR NEGATIVE NEGATIVE Final   Staphylococcus aureus NEGATIVE NEGATIVE Final    Comment:        The Xpert SA Assay (FDA approved for NASAL specimens in patients over 70 years of age), is one component of a comprehensive surveillance program.  Test performance has been validated by [facility] for patients greater than or equal to 88 year old. It is not intended to diagnose infection nor to guide or monitor treatment.   Aerobic/Anaerobic Culture  (surgical/deep wound)     Status: None (Preliminary result)   Collection Time: 01/10/16  8:47 AM  Result Value Ref Range Status   Specimen Description ABSCESS RT AXILLARY  Final   Special Requests NONE  Final   Gram Stain   Final    MODERATE WBC PRESENT, PREDOMINANTLY PMN ABUNDANT GRAM POSITIVE COCCI IN CLUSTERS Performed at [place] [facility]  Final   Report Status PENDING  Incomplete  Aerobic/Anaerobic Culture (surgical/deep wound)     Status: None (Preliminary result)   Collection Time: 01/10/16  8:55 AM  Result Value Ref Range Status   Specimen Description ABSCESS RT BUTTOCK  Final   Special Requests NONE  Final   Gram Stain   Final    FEW WBC PRESENT, PREDOMINANTLY PMN ABUNDANT GRAM POSITIVE COCCI IN CLUSTERS    Culture   Final    ABUNDANT STAPHYLOCOCCUS AUREUS SUSCEPTIBILITIES TO FOLLOW Performed at [facility]    Report Status PENDING  Incomplete     Studies: No results found. Scheduled Meds: . dolutegravir  50 mg Oral Daily  . doxycycline  100 mg Oral 2 times per day  . emtricitabine-tenofovir AF  1 tablet Oral Daily  . ferrous sulfate  325 mg Oral BID WC  . sulfamethoxazole-trimethoprim  1 tablet Oral Q12H   Continuous Infusions:    Principal Problem:   Abscess of right buttock Active Problems:   Asthma   Hypokalemia   AIDS (HCC)   Major depression, recurrent (HCC)   Pancytopenia ([place])   AIDS (acquired immune deficiency syndrome) ([place])  Time spent:   Irwin Brakeman, MD, FAAFP [facility] Pager [phone]  If 7PM-7AM, please contact night-coverage [email or web address] Password [email or web address] 01/12/2016, 11:05 AM    LOS: 3 days

## 2016-01-12 MED ORDER — SULFAMETHOXAZOLE-TRIMETHOPRIM 800-160 MG PO TABS: 1.0000 | ORAL_TABLET | Freq: Two times a day (BID) | ORAL | Status: DC

## 2016-01-12 MED ORDER — FERROUS SULFATE 325 (65 FE) MG PO TABS: 325.0000 mg | ORAL_TABLET | Freq: Every day | ORAL | 0 refills | Status: DC

## 2016-01-12 MED ORDER — DOXYCYCLINE HYCLATE 100 MG PO TABS: 100.0000 mg | ORAL_TABLET | Freq: Two times a day (BID) | ORAL | 0 refills | Status: AC

## 2016-01-12 NOTE — Progress Notes (Addendum)
2 Days Post-Op  Subjective: Having n/v after eating and thinks it is a side effect of antibiotics.  Objective: Vital signs in last 24 hours: Temp:  [97.7 F (36.5 C)-97.9 F (36.6 C)] 97.7 F (36.5 C) (07/23 0543) Pulse Rate:  [70-74] 74 (07/23 0543) Resp:  [14-15] 15 (07/23 0543) BP: (97-109)/(61-69) 97/69 (07/23 0543) SpO2:  [100 %] 100 % (07/23 0543) Last BM Date: 01/09/16  Intake/Output from previous day: 07/22 0701 - 07/23 0700 In: 1572 [P.O.:1172; IV Piggyback:400] Out: -  Intake/Output this shift: No intake/output data recorded.  PE: General- In NAD Right axillary clean Left labial wound clean Right buttock wound clean  Lab Results:   Recent Labs  01/09/16 2014 01/10/16 0127  WBC 2.9* 2.9*  HGB 8.9* 9.0*  HCT 28.6* 29.6*  PLT 114* 113*   BMET  Recent Labs  01/10/16 0127 01/11/16 0418  NA 133* 137  K 3.3* 4.4  CL 105 112*  CO2 22 22  GLUCOSE 97 90  BUN 13 9  CREATININE 0.79 0.69  CALCIUM 7.8* 7.7*   PT/INR No results for input(s): LABPROT, INR in the last 72 hours. Comprehensive Metabolic Panel:    Component Value Date/Time   NA 137 01/11/2016 0418   NA 133 (L) 01/10/2016 0127   K 4.4 01/11/2016 0418   K 3.3 (L) 01/10/2016 0127   CL 112 (H) 01/11/2016 0418   CL 105 01/10/2016 0127   CO2 22 01/11/2016 0418   CO2 22 01/10/2016 0127   BUN 9 01/11/2016 0418   BUN 13 01/10/2016 0127   CREATININE 0.69 01/11/2016 0418   CREATININE 0.79 01/10/2016 0127   CREATININE 0.84 01/09/2016 1622   CREATININE 0.71 05/06/2015 1105   GLUCOSE 90 01/11/2016 0418   GLUCOSE 97 01/10/2016 0127   CALCIUM 7.7 (L) 01/11/2016 0418   CALCIUM 7.8 (L) 01/10/2016 0127   AST 81 (H) 01/09/2016 2014   AST 71 (H) 01/09/2016 1622   ALT 32 01/09/2016 2014   ALT 27 01/09/2016 1622   ALKPHOS 101 01/09/2016 2014   ALKPHOS 105 01/09/2016 1622   BILITOT 0.7 01/09/2016 2014   BILITOT 0.6 01/09/2016 1622   PROT 8.1 01/09/2016 2014   PROT 8.2 (H) 01/09/2016 1622   ALBUMIN 3.2 (L) 01/09/2016 2014   ALBUMIN 3.2 (L) 01/09/2016 1622     Studies/Results: No results found.  Anti-infectives: Anti-infectives    Start     Dose/Rate Route Frequency Ordered Stop   01/12/16 1200  doxycycline (VIBRA-TABS) tablet 100 mg     100 mg Oral 2 times per day 01/11/16 1208     01/11/16 1300  ceFEPIme (MAXIPIME) 2 g in dextrose 5 % 50 mL IVPB     2 g 100 mL/hr over 30 Minutes Intravenous Every 12 hours 01/11/16 1155 01/12/16 2359   01/11/16 1230  vancomycin (VANCOCIN) IVPB 750 mg/150 ml premix     750 mg 150 mL/hr over 60 Minutes Intravenous Every 12 hours 01/11/16 1155 01/12/16 0013   01/11/16 1000  doxycycline (VIBRA-TABS) tablet 100 mg  Status:  Discontinued     100 mg Oral Every 12 hours 01/10/16 1450 01/11/16 1155   01/10/16 1000  dolutegravir (TIVICAY) tablet 50 mg     50 mg Oral Daily 01/10/16 0032     01/10/16 1000  emtricitabine-tenofovir AF (DESCOVY) 200-25 MG per tablet 1 tablet     1 tablet Oral Daily 01/10/16 0032     01/10/16 1000  vancomycin (VANCOCIN) IVPB 750 mg/150 ml premix  750 mg 150 mL/hr over 60 Minutes Intravenous Every 12 hours 01/10/16 0044 01/11/16 0112   01/10/16 0600  metroNIDAZOLE (FLAGYL) IVPB 500 mg  Status:  Discontinued     500 mg 100 mL/hr over 60 Minutes Intravenous Every 8 hours 01/10/16 0032 01/10/16 0037   01/10/16 0045  ceFEPIme (MAXIPIME) 2 g in dextrose 5 % 50 mL IVPB     2 g 100 mL/hr over 30 Minutes Intravenous Every 12 hours 01/10/16 0042 01/10/16 2303   01/09/16 2330  vancomycin (VANCOCIN) IVPB 1000 mg/200 mL premix     1,000 mg 200 mL/hr over 60 Minutes Intravenous  Once 01/09/16 2321 01/10/16 0050   01/09/16 2015  ciprofloxacin (CIPRO) IVPB 400 mg     400 mg 200 mL/hr over 60 Minutes Intravenous  Once 01/09/16 2007 01/09/16 2119   01/09/16 2015  metroNIDAZOLE (FLAGYL) IVPB 500 mg     500 mg 100 mL/hr over 60 Minutes Intravenous  Once 01/09/16 2007 01/09/16 2225      Assessment Principal Problem:    Abscesses of right buttock, left labia, right axilla s/p I & D (Dr. Excell Seltzer) 01/10/16-wounds clean; organism =staph aureus with sensitivities pending. dressing changes started Active Problems:  Pancytopenia ([place])   AIDS (acquired immune deficiency syndrome) ([place])    LOS: 3 days   Plan: Use only doxycycline for staph aureus.  Okay for discharge, with HHRN,  from our standpoint when tolerating diet.   Saralee Bolick Lenna Sciara 01/12/2016

## 2016-01-12 NOTE — Care Management Note (Signed)
Case Management Note  Patient Details  Name: Beverly FOSKETT MRN: [id number] Date of Birth: 1980/09/01  Subjective/Objective:                  buttock abscess  Action/Plan: CM spoke with patient at the bedside. [facility] selects [facility] for RN visits. Don[facility] at [facility] notified of the referral and discharge date of today.   Expected Discharge Date:  01/12/16         Expected Discharge Plan:  [place]  In-House Referral:     Discharge planning Services  CM Consult  Post Acute Care Choice:  Home Health Choice offered to:  Patient  DME Arranged:  N/A DME Agency:  NA  HH Arranged:  RN [place] Agency:  [facility] (now [facility])  Status of Service:  Completed, signed off  If discussed at [employer] of Stay Meetings, dates discussed:    Additional Comments:  Apolonio Schneiders, RN 01/12/2016, 11:52 AM

## 2016-01-12 NOTE — Discharge Summary (Signed)
Physician Discharge Summary  Beverly [name] [id number] DOB: 12/12/1980 DOA: 01/09/2016  PCP: No primary care provider on file.  Admit date: 01/09/2016 Discharge date: 01/12/2016  Admitted From: Home Disposition:  Home  Recommendations for Outpatient Follow-up:  1. Follow up with PCP in 1 weeks.  Follow up with surgery clinic 1 week for wound check.   Home Health: Yes Home Health RN for wound changes  Discharge Condition:Stable - Pt is HIGH RISK for readmission and poor outcome secondary to noncompliance and history of not following up as recommended.  CODE STATUS:FULL  Brief/Interim Summary: HPI: Beverly [name] is a 35 y.o. 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  The patient was in her usual state of health until about 1-2 weeks ago when she was in jail for the weekend, started to develop an "ingrown hair" in her groin because of the uniform. Since returning home, the spot kept getting bigger and more tender, despite Sitz bath and warm compresses. In the last two days, she has had some nausea, chills and pain with sitting. Today she was at [facility] for lab work and asked a nurse to see her buttock, which was very tender on exam, so she was sent to the ER.  Assessment & Plan:   1. Abscesses of the right buttock, right axilla and left labia:  -cultures positive for staph aureus.  -blood cultures NGTD -General surgery performed I&D 7/21 --Postop wound care per surgery - ordered [facility] for dressing changes, cleared to be discharged 7/23.  2. AIDS/HIV:  -Continue home Tivicay and Descovy -Consult to ID - I spoke with Dr. Johnnye Sima will notify Kinnie Scales RN who monitors and contacts patients who have poor compliance and will get her follow up in their office after discharge.  Pt to follow up with ID this week.   HIV 1 RNA Quant <20 copies/mL 338281 (H)   HIV1 RNA Quant, Log  <1.30 Log copies/mL 5.53 (H)     Pt has long history of noncompliance and HIGH RISK for readmission and poor outcomes.  I spoke with ID and they will see her if she comes in but she has not been coming in.  She said that she would.  She said that she would take her meds.  She said that she had all of her prescriptions at home and did not need new ones.  She said that she would get her antibiotic prescription filled.    3. Hypokalemia:  -repleted, will follow -repleted Mg   4. Social instability, near homeless, poor medical follow up:  -Consult to social work for housing assistance, connect with [place] to care management for home health needs  5. Depression:   6. 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 Possibly iron deficiency contributing.  -Check iron studies -Iron supplement ordered. -Agree with Gen Surg, transfusion threshold 7 g/dL  7. Asthma:  -Albuterol PRN  DVT prophylaxis: SCDs  Code Status: FULL  Family Communication: at bedside Disposition Plan: DC home Consults called: Gen Surg  Consultants:  General surgery , ID phone consult   Procedures:  I&D 7/21  Discharge Diagnoses:  Principal Problem:   Abscess of right buttock Active Problems:   Asthma   Hypokalemia   AIDS ([place])   Major depression, recurrent ([place])   Pancytopenia ([place])   AIDS (acquired immune deficiency syndrome) [facility])  Discharge Instructions  Discharge Instructions    Increase activity slowly  Complete by:  As directed       Medication List    STOP taking these medications   ibuprofen 200 MG tablet Commonly known as:  ADVIL,MOTRIN     TAKE these medications   acetaminophen 650 MG CR tablet Commonly known as:  TYLENOL Take 650 mg by mouth every 8 (eight) hours as needed for pain.   albuterol 108 (90 Base) MCG/ACT inhaler Commonly known as:  PROVENTIL HFA;VENTOLIN HFA Inhale 2 puffs into the lungs every 6 (six) hours as needed for wheezing or shortness of breath.    cetirizine 1 MG/ML syrup Commonly known as:  ZYRTEC Take 5 mLs (5 mg total) by mouth 2 (two) times daily.   dolutegravir 50 MG tablet Commonly known as:  TIVICAY Take 1 tablet (50 mg total) by mouth daily.   doxycycline 100 MG tablet Commonly known as:  VIBRA-TABS Take 1 tablet (100 mg total) by mouth 2 (two) times daily.   dronabinol 5 MG capsule Commonly known as:  MARINOL Take 1 capsule (5 mg total) by mouth 2 (two) times daily before a meal.   emtricitabine-tenofovir AF 200-25 MG tablet Commonly known as:  DESCOVY Take 1 tablet by mouth daily.   ferrous sulfate 325 (65 FE) MG tablet Take 1 tablet (325 mg total) by mouth daily with breakfast.   hydrocerin Crea Apply 1 application topically 2 (two) times daily. What changed:  when to take this  reasons to take this   trimethoprim-polymyxin b ophthalmic solution Commonly known as:  POLYTRIM Place 1 drop into both eyes every 4 (four) hours.   VISINE OP Apply 1-2 drops to eye 4 (four) times daily as needed (dryness.).      Follow-up Information    COMER, ROBERT, MD. Schedule an appointment as soon as possible for a visit in 1 week(s).   Specialty:  Infectious Diseases Why:  Hospital Follow Up Contact information: [address] [place] [address] [phone]        Edward Jolly, MD. Schedule an appointment as soon as possible for a visit in 1 week(s).   Specialty:  General Surgery Why:  Follow Up with surgery clinic for wound check Contact information: [address] [place] [address] [phone]          Allergies  Allergen Reactions  . Bee Venom Anaphylaxis, Shortness Of Breath and Swelling  . Morphine And Related Hives and Itching    Just can't take "morphine", vicodin is OK   Procedures/Studies: Ct Abdomen Pelvis W Contrast  Result Date: 01/09/2016 CLINICAL DATA:  Right buttocks abscess. EXAM: CT ABDOMEN AND PELVIS WITH CONTRAST TECHNIQUE: Multidetector  CT imaging of the abdomen and pelvis was performed using the standard protocol following bolus administration of intravenous contrast. CONTRAST:  154mL ISOVUE-300 IOPAMIDOL (ISOVUE-300) INJECTION 61% COMPARISON:  None. FINDINGS: Lower chest and abdominal wall: Ovoid rim enhancing fluid collection in the left labia measuring 15 by 6 mm. There is surrounding cellulitis. 60 x 25 mm fluid collection in the right buttocks distorting the gluteal cleft. Rim enhancement is less well-defined but this still to be liquifaction consistent with at least partial abscess. Neighboring cellulitis. There is focal cellulitic change over the right lower abdominal wall. No soft tissue emphysema. Hepatobiliary: No focal liver abnormality.No evidence of biliary obstruction or stone. Pancreas: Unremarkable. Spleen: Unremarkable. Adrenals/Urinary Tract: Negative adrenals. Small nonobstructing right renal calculi, at least 3, measuring up to 4 mm. At least 2 punctate left renal calculi. No hydronephrosis or  ureteral calculus . There is a crescentic shaped fluid collection right and ventral to the lower urethra, measuring up to 13 by 6 mm. Stomach/Bowel:  No obstruction. No appendicitis. Reproductive:Negative uterus and ovaries. Vascular/Lymphatic: No acute vascular abnormality. Bilateral inguinal and external chain iliac adenopathy, presumably reactive given the pelvic findings. Other: No ascites or pneumoperitoneum. Musculoskeletal: No acute abnormalities. IMPRESSION: 1. 6 x 3 cm right buttocks abscess and phlegmon.No soft tissue emphysema or supralevator involvement. 2. 15 x 6 mm left labia abscess. 3. Urethral diverticulum. 4. Bilateral nephrolithiasis. 5. Inguinal and iliac adenopathy presumably reactive to #1 and 2. Recommend clinical follow-up. Electronically Signed   By: Monte Fantasia M.D.   On: 01/09/2016 22:22     Subjective: Pt without complaints.  Tolerating diet now.  Asking to go home.   Discharge Exam: Vitals:    01/11/16 1535 01/12/16 0543  BP: 109/61 97/69  Pulse: 70 74  Resp: 14 15  Temp: 97.9 F (36.6 C) 97.7 F (36.5 C)   Vitals:   01/10/16 2210 01/11/16 0500 01/11/16 1535 01/12/16 0543  BP: (!) 109/39 94/60 109/61 97/69  Pulse: 63 (!) 52 70 74  Resp: 17 16 14 15   Temp: 97.7 F (36.5 C) 98.2 F (36.8 C) 97.9 F (36.6 C) 97.7 F (36.5 C)  TempSrc: Oral Oral Oral Oral  SpO2: 100% 100% 100% 100%  Weight:      Height:       General exam: awake, alert, no distress, cooperative.  Respiratory system: Clear. No increased work of breathing. Cardiovascular system: S1 & S2 heard, RRR. No JVD, murmurs, gallops, clicks or pedal edema. Gastrointestinal system: Abdomen is nondistended, soft and nontender. Normal bowel sounds heard. Central nervous system: Alert and oriented. No focal neurological deficits. Wounds: clean, dry.  The results of significant diagnostics from this hospitalization (including imaging, microbiology, ancillary and laboratory) are listed below for reference.     Microbiology: Recent Results (from the past 240 hour(s))  Culture, blood (routine x 2)     Status: None (Preliminary result)   Collection Time: 01/09/16 11:52 PM  Result Value Ref Range Status   Specimen Description RIGHT ANTECUBITAL  Final   Special Requests BOTTLES DRAWN AEROBIC AND ANAEROBIC 5CC  Final   Culture   Final    NO GROWTH 1 DAY Performed at [facility]    Report Status PENDING  Incomplete  Culture, blood (routine x 2)     Status: None (Preliminary result)   Collection Time: 01/09/16 11:54 PM  Result Value Ref Range Status   Specimen Description LEFT ANTECUBITAL  Final   Special Requests BOTTLES DRAWN AEROBIC AND ANAEROBIC 5CC  Final   Culture   Final    NO GROWTH 1 DAY Performed at [facility]    Report Status PENDING  Incomplete  Surgical pcr screen     Status: None   Collection Time: 01/10/16  1:00 AM  Result Value Ref Range Status   MRSA, PCR NEGATIVE NEGATIVE  Final   Staphylococcus aureus NEGATIVE NEGATIVE Final    Comment:        The Xpert SA Assay (FDA approved for NASAL specimens in patients over 24 years of age), is one component of a comprehensive surveillance program.  Test performance has been validated by [facility] for patients greater than or equal to 14 year old. It is not intended to diagnose infection nor to guide or monitor treatment.   Aerobic/Anaerobic Culture (surgical/deep wound)     Status: None (  Preliminary result)   Collection Time: 01/10/16  8:47 AM  Result Value Ref Range Status   Specimen Description ABSCESS RT AXILLARY  Final   Special Requests NONE  Final   Gram Stain   Final    MODERATE WBC PRESENT, PREDOMINANTLY PMN ABUNDANT GRAM POSITIVE COCCI IN CLUSTERS Performed at [facility]  Final   Report Status PENDING  Incomplete  Aerobic/Anaerobic Culture (surgical/deep wound)     Status: None (Preliminary result)   Collection Time: 01/10/16  8:55 AM  Result Value Ref Range Status   Specimen Description ABSCESS RT BUTTOCK  Final   Special Requests NONE  Final   Gram Stain   Final    FEW WBC PRESENT, PREDOMINANTLY PMN ABUNDANT GRAM POSITIVE COCCI IN CLUSTERS    Culture   Final    ABUNDANT STAPHYLOCOCCUS AUREUS SUSCEPTIBILITIES TO FOLLOW Performed at [facility]    Report Status PENDING  Incomplete     Labs: BNP (last 3 results) No results for input(s): BNP in the last 8760 hours. Basic Metabolic Panel:  Recent Labs Lab 01/09/16 1622 01/09/16 2014 01/10/16 0127 01/11/16 0418  NA 135 136 133* 137  K 3.6 3.0* 3.3* 4.4  CL 105 107 105 112*  CO2 19* 21* 22 22  GLUCOSE 76 77 97 90  BUN 12 14 13 9   CREATININE 0.84 0.88 0.79 0.69  CALCIUM 7.9* 8.2* 7.8* 7.7*  MG  --   --  1.6*  --    Liver Function Tests:  Recent Labs Lab 01/09/16 1622 01/09/16 2014  AST 71* 81*  ALT 27 32  ALKPHOS 105 101  BILITOT 0.6 0.7  PROT 8.2* 8.1   ALBUMIN 3.2* 3.2*   No results for input(s): LIPASE, AMYLASE in the last 168 hours. No results for input(s): AMMONIA in the last 168 hours. CBC:  Recent Labs Lab 01/09/16 1622 01/09/16 2014 01/10/16 0127  WBC 2.9* 2.9* 2.9*  NEUTROABS 1,914 1.9  --   HGB 9.2* 8.9* 9.0*  HCT 29.0* 28.6* 29.6*  MCV 74.0* 76.1* 77.3*  PLT 153 114* 113*   Cardiac Enzymes: No results for input(s): CKTOTAL, CKMB, CKMBINDEX, TROPONINI in the last 168 hours. BNP: Invalid input(s): POCBNP CBG: No results for input(s): GLUCAP in the last 168 hours. D-Dimer No results for input(s): DDIMER in the last 72 hours. Hgb A1c No results for input(s): HGBA1C in the last 72 hours. Lipid Profile No results for input(s): CHOL, HDL, LDLCALC, TRIG, CHOLHDL, LDLDIRECT in the last 72 hours. Thyroid function studies No results for input(s): TSH, T4TOTAL, T3FREE, THYROIDAB in the last 72 hours.  Invalid input(s): FREET3 Anemia work up  Recent Labs  01/10/16 0127  FERRITIN 97  TIBC 325  IRON 20*   Urinalysis    Component Value Date/Time   COLORURINE AMBER (A) 08/12/2014 2350   APPEARANCEUR TURBID (A) 08/12/2014 2350   LABSPEC 1.025 08/12/2014 2350   PHURINE 6.0 08/12/2014 2350   GLUCOSEU NEGATIVE 08/12/2014 2350   GLUCOSEU NEG mg/dL 06/28/2008 2142   HGBUR MODERATE (A) 08/12/2014 2350   BILIRUBINUR SMALL (A) 08/12/2014 2350   KETONESUR NEGATIVE 08/12/2014 2350   PROTEINUR 100 (A) 08/12/2014 2350   UROBILINOGEN 1.0 08/12/2014 2350   NITRITE NEGATIVE 08/12/2014 2350   LEUKOCYTESUR LARGE (A) 08/12/2014 2350   Sepsis Labs Invalid input(s): PROCALCITONIN,  WBC,  LACTICIDVEN Microbiology Recent Results (from the past 240 hour(s))  Culture, blood (routine x 2)     Status: None (  Preliminary result)   Collection Time: 01/09/16 11:52 PM  Result Value Ref Range Status   Specimen Description RIGHT ANTECUBITAL  Final   Special Requests BOTTLES DRAWN AEROBIC AND ANAEROBIC 5CC  Final   Culture   Final     NO GROWTH 1 DAY Performed at [facility]    Report Status PENDING  Incomplete  Culture, blood (routine x 2)     Status: None (Preliminary result)   Collection Time: 01/09/16 11:54 PM  Result Value Ref Range Status   Specimen Description LEFT ANTECUBITAL  Final   Special Requests BOTTLES DRAWN AEROBIC AND ANAEROBIC 5CC  Final   Culture   Final    NO GROWTH 1 DAY Performed at [facility]    Report Status PENDING  Incomplete  Surgical pcr screen     Status: None   Collection Time: 01/10/16  1:00 AM  Result Value Ref Range Status   MRSA, PCR NEGATIVE NEGATIVE Final   Staphylococcus aureus NEGATIVE NEGATIVE Final    Comment:        The Xpert SA Assay (FDA approved for NASAL specimens in patients over 66 years of age), is one component of a comprehensive surveillance program.  Test performance has been validated by [facility] for patients greater than or equal to 19 year old. It is not intended to diagnose infection nor to guide or monitor treatment.   Aerobic/Anaerobic Culture (surgical/deep wound)     Status: None (Preliminary result)   Collection Time: 01/10/16  8:47 AM  Result Value Ref Range Status   Specimen Description ABSCESS RT AXILLARY  Final   Special Requests NONE  Final   Gram Stain   Final    MODERATE WBC PRESENT, PREDOMINANTLY PMN ABUNDANT GRAM POSITIVE COCCI IN CLUSTERS Performed at [facility]  Final   Report Status PENDING  Incomplete  Aerobic/Anaerobic Culture (surgical/deep wound)     Status: None (Preliminary result)   Collection Time: 01/10/16  8:55 AM  Result Value Ref Range Status   Specimen Description ABSCESS RT BUTTOCK  Final   Special Requests NONE  Final   Gram Stain   Final    FEW WBC PRESENT, PREDOMINANTLY PMN ABUNDANT GRAM POSITIVE COCCI IN CLUSTERS    Culture   Final    ABUNDANT STAPHYLOCOCCUS AUREUS SUSCEPTIBILITIES TO FOLLOW Performed at [facility]     Report Status PENDING  Incomplete   Time coordinating discharge: 31 minutes  SIGNED:   Irwin Brakeman, MD  [facility] 01/12/2016, 11:15 AM Pager   If 7PM-7AM, please contact night-coverage [email or web address] Password [email or web address]

## 2016-01-12 NOTE — Progress Notes (Signed)
Patient discharged home, all discharge medications and instructions reviewed and questions answered.  Patient to be assisted to vehicle by wheelchair when ready to leave.

## 2016-01-15 LAB — CULTURE, BLOOD (ROUTINE X 2)
Culture: NO GROWTH
Culture: NO GROWTH

## 2016-01-15 LAB — AEROBIC/ANAEROBIC CULTURE (SURGICAL/DEEP WOUND)

## 2016-01-15 LAB — AEROBIC/ANAEROBIC CULTURE W GRAM STAIN (SURGICAL/DEEP WOUND)

## 2016-02-10 NOTE — Progress Notes (Signed)
RN meet with Domingo Sep in her hospital room. RN introduced herself and allowed [place] to talk and express her many struggles. After actively listening to Fraser, Langdon Place explained my role and offered to assist her or just be a suppot for her. Syra took my card but did not commit to see me after her discharge. Yula stated she would call me after discharging from the hospital

## 2016-02-18 ENCOUNTER — Ambulatory Visit: Payer: Medicaid Other | Admitting: Infectious Disease

## 2016-05-01 ENCOUNTER — Telehealth: Payer: Self-pay | Admitting: *Deleted

## 2016-05-01 NOTE — Telephone Encounter (Signed)
Sounds good to me

## 2016-05-01 NOTE — Telephone Encounter (Signed)
RN received a call from [place] with [facility] and she stated she feels that Paedyn could benefit from having my help along with [facility] for housing. Currently Kelani is homeless and unemployed. Jinny Blossom would love to see her Kota back in care and she states Shavonte has expressed that she wants to be in care

## 2016-05-05 ENCOUNTER — Telehealth: Payer: Self-pay | Admitting: *Deleted

## 2016-05-05 ENCOUNTER — Other Ambulatory Visit: Payer: Medicaid Other | Admitting: *Deleted

## 2016-05-05 NOTE — Telephone Encounter (Signed)
RN contacted Edwin Dada and left a message asking he is she is still available today for a visit?

## 2016-05-06 ENCOUNTER — Other Ambulatory Visit: Payer: Self-pay | Admitting: *Deleted

## 2016-05-06 ENCOUNTER — Telehealth: Payer: Self-pay | Admitting: *Deleted

## 2016-05-06 ENCOUNTER — Ambulatory Visit: Payer: Self-pay | Admitting: *Deleted

## 2016-05-06 VITALS — BP 118/66 | HR 80 | Temp 98.2°F

## 2016-05-06 DIAGNOSIS — B20 Human immunodeficiency virus [HIV] disease: Secondary | ICD-10-CM

## 2016-05-06 DIAGNOSIS — F331 Major depressive disorder, recurrent, moderate: Secondary | ICD-10-CM

## 2016-05-06 DIAGNOSIS — Z5900 Homelessness unspecified: Secondary | ICD-10-CM

## 2016-05-06 MED ORDER — EMTRICITABINE-TENOFOVIR AF 200-25 MG PO TABS: 1.0000 | ORAL_TABLET | Freq: Every day | ORAL | 11 refills | Status: DC

## 2016-05-06 MED ORDER — DOLUTEGRAVIR SODIUM 50 MG PO TABS: 50.0000 mg | ORAL_TABLET | Freq: Every day | ORAL | 11 refills | Status: DC

## 2016-05-06 MED ORDER — AZITHROMYCIN 600 MG PO TABS: 1200.0000 mg | ORAL_TABLET | ORAL | 5 refills | Status: DC

## 2016-05-06 MED ORDER — SULFAMETHOXAZOLE-TRIMETHOPRIM 800-160 MG PO TABS: 1.0000 | ORAL_TABLET | Freq: Every day | ORAL | 11 refills | Status: DC

## 2016-05-06 NOTE — Telephone Encounter (Signed)
Patient is engaged with Beverly [name], wants to restart medication and return to care. She has follow up 11/20 with Dr Tommy Medal. Per verbal order from Dr Tommy Medal, RN sent new prescriptions of patient's previous regimen (tivicay, descovy) as well as azithromycin 1200 mg weekly and bactrim ds daily.    Of note: [name] pharmacy of choice ([facility] at [facility]) refused to waive the $3 copay for her medications, even at this RN's request - pharmacy manager stated they needed to speak with medicaid, not the prescriber's office.  RN contacted [employer]. Not only will they waive the $3 copay, but they will speak with the other pharmacy who refused to do so.  [name] to pick up medications, deliver to patient.   Landis Gandy, RN

## 2016-05-06 NOTE — Telephone Encounter (Signed)
I would consider moving her prescription to [facility] outpatient pharmacy

## 2016-05-06 NOTE — Telephone Encounter (Signed)
RN missed the return call from [place] stating I could come by and visit her. RN contacted [place] today to see if we can meet today for a home visit. Could not leave a message so RN sent a text message stating I am free if we can meet today

## 2016-05-07 ENCOUNTER — Ambulatory Visit: Payer: Self-pay | Admitting: *Deleted

## 2016-05-07 ENCOUNTER — Telehealth: Payer: Self-pay | Admitting: *Deleted

## 2016-05-07 DIAGNOSIS — B20 Human immunodeficiency virus [HIV] disease: Secondary | ICD-10-CM

## 2016-05-07 NOTE — Telephone Encounter (Signed)
Attempted to call to confirm that the patient's medications are ready for pick up. Will attempt another call at a later time

## 2016-05-11 ENCOUNTER — Ambulatory Visit: Payer: Medicaid Other | Admitting: *Deleted

## 2016-05-11 ENCOUNTER — Ambulatory Visit (INDEPENDENT_AMBULATORY_CARE_PROVIDER_SITE_OTHER): Payer: Medicaid Other | Admitting: Infectious Disease

## 2016-05-11 ENCOUNTER — Encounter: Payer: Self-pay | Admitting: Infectious Disease

## 2016-05-11 VITALS — BP 118/72 | HR 84 | Temp 98.1°F | Ht 61.0 in | Wt 134.0 lb

## 2016-05-11 DIAGNOSIS — C469 Kaposi's sarcoma, unspecified: Secondary | ICD-10-CM

## 2016-05-11 DIAGNOSIS — R45851 Suicidal ideations: Secondary | ICD-10-CM

## 2016-05-11 DIAGNOSIS — Z85828 Personal history of other malignant neoplasm of skin: Secondary | ICD-10-CM | POA: Insufficient documentation

## 2016-05-11 DIAGNOSIS — B20 Human immunodeficiency virus [HIV] disease: Secondary | ICD-10-CM

## 2016-05-11 DIAGNOSIS — Z5900 Homelessness unspecified: Secondary | ICD-10-CM

## 2016-05-11 DIAGNOSIS — F332 Major depressive disorder, recurrent severe without psychotic features: Secondary | ICD-10-CM

## 2016-05-11 DIAGNOSIS — F411 Generalized anxiety disorder: Secondary | ICD-10-CM

## 2016-05-11 DIAGNOSIS — F331 Major depressive disorder, recurrent, moderate: Secondary | ICD-10-CM

## 2016-05-11 HISTORY — DX: Kaposi's sarcoma, unspecified: C46.9

## 2016-05-11 NOTE — Progress Notes (Signed)
Counselor met with Domingo Sep today in the exam room for a warm hand off as patient reported to nursing staff that she is at the end of her rope and is done with life.  Patient was observed crying and sad.  Patient was oriented times four with tearful affect.  Patient was alert and very talkative.  Patient was speaking rapidly without much of a break.  Counselor gently interrupted her in order to get a word in edge wise. Patient was repeating all the bad things that were going on in her life. Patient was not taking any responsibility for anything.  Patient stated that she was homeless, had four children, and was so overwhelmed that she did not care whether she woke up tomorrow morning or not. Patient said that she would not physically hurt herself but just wished she would die so she would not have to go through anything else in life. Counselor provided support and encouragement for patient. Counselor used reflective listening skills while patient shared. Counselor recommended that patient seek emergency services if she got to a point that she wanted to hurt herself.  Patient made a verbal contract that she would not hurt herself. Patient stated that she did need to make some changes that she was very concerned about the welfare of her children as she was not doing a good job taking care of them right now,  Counselor recommended that patient meet again on Monday next week to further process what was going on in her life, recognize local resources, as well as discuss what can be done for the safety of her children until she could get back on her feet.   Rolena Infante, MA, LPC [facility]

## 2016-05-11 NOTE — Progress Notes (Signed)
Chief complaint: painful leg ulcers and cough  Subjective:    Patient ID: Beverly [name], female    DOB: [birth year], 35 y.o.   MRN: [id number]  Chief complaint hair loss profound depression with passive suicidal ideation ulcers multiple "bug bites"  Cough  Associated symptoms include myalgias and a rash. Pertinent negatives include no chest pain, chills, fever, rhinorrhea, sore throat, shortness of breath or wheezing.    35 year old with HIV/AIDS who has been largely noncompliant except for period in January of 2013 where she had an undetectable viral load.  Again absent that period she has had largely uncontrolled viremia and low cd4.   His been absent for care for more than a year and not been able to establish urological suppression. She has a very chaotic life currently. She is currently living in a car with several of her children and they're having to run the car with gasoline just to obtain sufficient heat to stay warm when it temperature DIPs. Apparently she knows housing authorities locally over $6100 in unpaid dues including findings.  She has other legal problems as well including community service that she was supposed to serve but which of course is very difficult for her to do her current circumstances.  She remains profoundly immunocompromised. She does not yet tell her children of her condition though he believes her son may know her condition. One of her daughters is also apparently very unstable and recently been incarcerated.  He has been endorsing passive suicidal ideation wishing that she would just not wake up.  She states that there've been multiple roaches and spiders that a been biting her in the car.  Past Medical History:  Diagnosis Date  . Abscess   . AIDS ([place]) 03/19/2015  . Anxiety   . Asthma   . Depression   . HIV infection ([place])   . Homeless 03/19/2015  . Leg lesion 03/19/2015  . Major depression, recurrent ([place]) 03/19/2015  . 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 Skin ulcer ([place]) 05/06/2015    Past Surgical History:  Procedure Laterality Date  . CESAREAN SECTION    . INCISION AND DRAINAGE ABSCESS Right 01/10/2016   Procedure: INCISION AND DRAINAGE RIGHT BUTTOCK, LEFT LABIAL , EXCISION AND DRAINAGE OF  RIGHT AXILLARY ABSCESS;  Surgeon: Excell Seltzer, MD;  Location: [facility];  Service: General;  Laterality: Right;    Family History  Problem Relation Age of Onset  . Hypertension Other   . Cancer Other   . Diabetes Other   . Asthma Other   . Throat cancer Mother   . Throat cancer Father       Social History   Social History  . Marital status: Single    Spouse name: N/A  . Number of children: N/A  . Years of education: N/A   Social History Main Topics  . Smoking status: Current Every Day Smoker    Packs/day: 0.30    Years: 3.00    Types: Cigarettes  . Smokeless tobacco: Never Used  . Alcohol use 0.0 oz/week     Comment: 5-6 drinks a month.   . Drug use:     Frequency: 2.0 times per week    Types: Marijuana  . Sexual activity: Not on file     Comment: pt. given condoms   Other Topics Concern  . Not on file   Social History Narrative  . No narrative on file    Allergies  Allergen Reactions  .  Bee Venom Anaphylaxis, Shortness Of Breath and Swelling  . Morphine And Related Hives and Itching    Just can't take "morphine", vicodin is OK     Current Outpatient Prescriptions:  .  albuterol (PROVENTIL HFA;VENTOLIN HFA) 108 (90 BASE) MCG/ACT inhaler, Inhale 2 puffs into the lungs every 6 (six) hours as needed for wheezing or shortness of breath., Disp: 1 Inhaler, Rfl: 6 .  azithromycin (ZITHROMAX) 600 MG tablet, Take 2 tablets (1,200 mg total) by mouth once a week., Disp: 8 tablet, Rfl: 5 .  cetirizine (ZYRTEC) 1 MG/ML syrup, Take 5 mLs (5 mg total) by mouth 2 (two) times daily., Disp: 118 mL, Rfl: 12 .  dolutegravir (TIVICAY) 50 MG tablet, Take 1 tablet (50 mg total) by mouth daily., Disp: 30 tablet, Rfl:  11 .  dronabinol (MARINOL) 5 MG capsule, Take 1 capsule (5 mg total) by mouth 2 (two) times daily before a meal., Disp: 60 capsule, Rfl: 2 .  emtricitabine-tenofovir AF (DESCOVY) 200-25 MG tablet, Take 1 tablet by mouth daily., Disp: 30 tablet, Rfl: 11 .  ferrous sulfate 325 (65 FE) MG tablet, Take 1 tablet (325 mg total) by mouth daily with breakfast., Disp: 30 tablet, Rfl: 0 .  hydrocerin (EUCERIN) CREA, Apply 1 application topically 2 (two) times daily. (Patient taking differently: Apply 1 application topically 2 (two) times daily as needed (dryness). ), Disp: 454 g, Rfl: 0 .  sulfamethoxazole-trimethoprim (BACTRIM DS,SEPTRA DS) 800-160 MG tablet, Take 1 tablet by mouth daily., Disp: 30 tablet, Rfl: 11 .  Tetrahydrozoline HCl (VISINE OP), Apply 1-2 drops to eye 4 (four) times daily as needed (dryness.)., Disp: , Rfl:  .  trimethoprim-polymyxin b (POLYTRIM) ophthalmic solution, Place 1 drop into both eyes every 4 (four) hours., Disp: , Rfl:     Review of Systems  Constitutional: Negative for activity change, chills, diaphoresis, fatigue, fever and unexpected weight change.  HENT: Negative for congestion, rhinorrhea, sinus pressure, sneezing, sore throat and trouble swallowing.   Eyes: Negative for photophobia and visual disturbance.  Respiratory: Negative for chest tightness, shortness of breath, wheezing and stridor.   Cardiovascular: Negative for chest pain, palpitations and leg swelling.  Gastrointestinal: Positive for nausea. Negative for abdominal distention, abdominal pain, anal bleeding, blood in stool, constipation, diarrhea and vomiting.  Genitourinary: Negative for difficulty urinating, dysuria, flank pain and hematuria.  Musculoskeletal: Positive for myalgias. Negative for arthralgias, back pain, gait problem and joint swelling.  Skin: Positive for color change, rash and wound. Negative for pallor.  Neurological: Negative for dizziness, tremors, weakness and light-headedness.    Hematological: Negative for adenopathy. Does not bruise/bleed easily.  Psychiatric/Behavioral: Positive for decreased concentration, dysphoric mood, sleep disturbance and suicidal ideas. Negative for agitation, behavioral problems, confusion and self-injury. The patient is nervous/anxious.        Objective:   Physical Exam  Constitutional: She is oriented to person, place, and time. No distress.  HENT:  Head: Normocephalic and atraumatic.  Mouth/Throat: No oropharyngeal exudate.  Eyes: Conjunctivae and EOM are normal. No scleral icterus.  Neck: Normal range of motion. Neck supple.  Cardiovascular: Normal rate, regular rhythm and normal heart sounds.  Exam reveals no gallop and no friction rub.   No murmur heard. Pulmonary/Chest: Effort normal. No respiratory distress. She has no wheezes.  Abdominal: She exhibits no distension.  Musculoskeletal: She exhibits no edema.  Neurological: She is alert and oriented to person, place, and time. She exhibits normal muscle tone. Coordination normal.  Skin: Skin is warm and dry. Lesion  noted. No rash noted. She is not diaphoretic. There is erythema. No pallor.  Psychiatric: Her behavior is normal. Judgment and thought content normal. Her mood appears anxious. She exhibits a depressed mood.  tearful    03/19/15: Large ulcerated lesion on the right leg with hyperpigmented borders but clearing in the middle. She has other hyperpigmented areas laterally    03/25/15: Leg with less areas of hypopigmentation and perhaps smaller      05/06/15 Right leg ulcers:         03/19/15:    Left leg with areas of hyperpigmentation and excoriation       05/05/25:      05/11/16:     Left leg 05/11/16:          Assessment & Plan:     HIV/AIDS: Restarted Descovy, Tivicay and  Bactrim to daily. She will come back and see the pharmacists in 1 week's time and bring medications with her.  Depression,: Start low-dose Lexapro 5  mg daily, to continue me with Jeral Fruit and Grayland Ormond. She is contracted for safety with regards to her depression. I like her to get back in with a psychiatrist every depression worsens we may need to admit her to behavioral health right now she does not want to go to behavioral health due to trying to stay with her kids who are currently living with her in her car.  Homeless: will have her work with CM on housing for her. Per Mitch with [facility] shows $6100 at least to the housing authority and has some legal issues outstanding as well. It may be very difficult to place her housing that she clearly deserves it. 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 She has multiple lesions on her body as well may be due to superinfection with MRSA will increase her Bactrim to twice daily.   I spent greater than 40 minutes with the patient including greater than 50% of time in face to face counsel of the patient re her HIV, AIDS, her skin lesions, her depression, her skin disease , most this legal problems in coordination of her care.  Alcide Evener, MD

## 2016-05-13 ENCOUNTER — Telehealth: Payer: Self-pay | Admitting: *Deleted

## 2016-05-13 NOTE — Telephone Encounter (Signed)
Attempted to call and check on patient and no answer and no voice mail. Left a voice mail with her sister (emergency contact) and asked her to have [place] call me if possible. Myrtis Hopping

## 2016-05-18 ENCOUNTER — Ambulatory Visit: Payer: Medicaid Other | Admitting: Pharmacist

## 2016-05-18 ENCOUNTER — Ambulatory Visit: Payer: Medicaid Other | Admitting: *Deleted

## 2016-05-18 DIAGNOSIS — F329 Major depressive disorder, single episode, unspecified: Secondary | ICD-10-CM

## 2016-05-18 DIAGNOSIS — B20 Human immunodeficiency virus [HIV] disease: Secondary | ICD-10-CM

## 2016-05-18 DIAGNOSIS — F32A Depression, unspecified: Secondary | ICD-10-CM

## 2016-05-18 NOTE — Progress Notes (Signed)
Beverly [name] showed for her scheduled appointment today with counselor.

## 2016-05-18 NOTE — Progress Notes (Signed)
HPI: Beverly [name] is a 35 y.o. female who presents to the [facility] today for follow-up of her HIV.  She is following up with me one week after presenting to Dr. Derek Mound clinic after being out of care for ~1 year.  She has multiple social issues and comes in today for a check in.  Allergies: Allergies  Allergen Reactions  . Bee Venom Anaphylaxis, Shortness Of Breath and Swelling  . Morphine And Related Hives and Itching    Just can't take "morphine", vicodin is OK    Past Medical History: Past Medical History:  Diagnosis Date  . Abscess   . AIDS ([place]) 03/19/2015  . Anxiety   . Asthma   . Depression   . HIV infection ([place])   . Homeless 03/19/2015  . 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 Leg lesion 03/19/2015  . Major depression, recurrent ([place]) 03/19/2015  . 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 Skin ulcer ([place]) 05/06/2015    Social History: Social History   Social History  . Marital status: Single    Spouse name: N/A  . Number of children: N/A  . Years of education: N/A   Social History Main Topics  . Smoking status: Current Every Day Smoker    Packs/day: 0.30    Years: 3.00    Types: Cigarettes  . Smokeless tobacco: Never Used  . Alcohol use 0.0 oz/week     Comment: 5-6 drinks a month.   . Drug use:     Frequency: 2.0 times per week    Types: Marijuana  . Sexual activity: Not on file     Comment: pt. given condoms   Other Topics Concern  . Not on file   Social History Narrative  . No narrative on file    Current Regimen: Tivicay + Descovy  Labs: HIV 1 RNA Quant (copies/mL)  Date Value  01/09/2016 338,281 (H)  05/06/2015 341 (H)  03/05/2015 [id number] (H)   CD4 T Cell Abs (/uL)  Date Value  01/09/2016 <10 (L)  05/06/2015 10 (L)  03/05/2015 30 (L)   Hep B S Ab (no units)  Date Value  06/28/2008 NEG   Hepatitis B Surface Ag (no units)  Date Value  06/28/2008 NEG   HCV Ab (no units)  Date Value  08/15/2014 NEGATIVE     CrCl: CrCl cannot be calculated ([name] most recent lab result is older than the maximum 21 days allowed.).  Lipids:    Component Value Date/Time   CHOL 108 (L) 03/05/2015 0956   TRIG 101 03/05/2015 0956   HDL 39 (L) 03/05/2015 0956   CHOLHDL 2.8 03/05/2015 0956   VLDL 20 03/05/2015 0956   LDLCALC 49 03/05/2015 0956    Assessment: Beverly [name] is here today for follow-up.  She tells me that she has been taking the Tivicay and Descovy with no issues.  She says that she does not have any issues tolerating them or swallowing them.  She does tell me that the two big white pills that she takes once a week (azithromycin) seem to get caught in her throat and makes it itch immediately after taking. She does not have shortness of breath or throat swelling associated with it. She also tells me that the throat itching goes away.  She is not having any diarrhea, nausea, or vomiting with her medications.  She is telling me that she desperately needs her Lexapro, and Beverly [name] will take her to pick it up after this appointment.  She is also telling me that she needs referral to a pain clinic because she has intense pain in her legs, back, and arms.  I told her that is something she will have to discuss with her physician. She will follow up with Beverly [name] and pharmacy next week.  Plans: - Continue Tivicay + Descovy - Continue azithromycin 1200 mg once weekly - Continue Bactrim DS 1 tablet PO daily - F/u with Beverly [name] and pharmacy next week - F/u with Dr. Tommy Medal 06/24/16 at 3:30pm  Beverly [name] L. Westley Gambles, PharmD Infectious Diseases [facility] 05/18/2016, 3:18 PM

## 2016-05-19 ENCOUNTER — Telehealth: Payer: Self-pay | Admitting: *Deleted

## 2016-05-19 NOTE — Telephone Encounter (Signed)
[name], did you already get Sosie's meds?

## 2016-05-19 NOTE — Telephone Encounter (Signed)
RN contacted the patient to arrange a home visit. RN was unable to reach the patient over the phone and voicemail has not been set up. RN will attempt another call

## 2016-05-20 NOTE — Patient Instructions (Signed)
RN met with client and or caregiver for assessment. RN reviewed Agency Services, [place] Notice of Privacy Policy, Home Safety Management Information Booklet. Home Fire Safety Assessment, Fall Risk Assessment and Suicide Risk Assessment was performed. RN also discussed information on a Living Will, Advanced Directives, and Health Care Power of Attorney. RN and Client/Designated Party educated/reviewed/signed Client Agreement and Consent for Service form along with Patient Rights and Responsibilities statement. RN developed patient specific and centered care plan. RN provided contact information and reviewed how to receive emergency help after hours for schedule changes, billing questions, reporting of safety issues, falls, concerns or any needs/questions. Standard Precaution and Infection control along with interventions to correct or prevent high risk behaviors instructed to the patient. Client/Caregiver reports understanding and agreement with the above 

## 2016-05-20 NOTE — Progress Notes (Signed)
Order received on 05/01/16 by Dr. Tommy Medal to evaluate patient for [facility]). Initial contact was made within 48hrs with evaluation conducted on 05/06/2016 for [facility]. Patient was consented to care at this time.   Frequency / Duration of [facility] visits: Effective 05/06/2016: 45mo1,3mo1, 32mo1, 4PRN's for complications with disease process/progression, medication changes or concerns  [facility] will assess for learning needs related to diagnosis and treatment regimen, provide education as needed, fill pill box if needed, address any barriers which may be preventing medication compliance, and communicating with care team including physician and case manager.   Individualized Plan Of Care, Certification Period 05/06/2016 to 08/04/2016   a. Type of service(s) and care to be delivered: [facility] Nurse   b. Frequency and duration of service: Effective 05/06/2016: 40mo1,3mo1, 34mo1, 4PRN's for complications with disease process/progression,  medication changes or concerns   c. Activity restrictions: Pt may be up as tolerated and can safely ambulate without the need for a assistive device   d. Safety Measures: Standard Precautions/Infection Control Measures  e. Service Objectives and Goals: Service Objectives are to assist the pt with HIV medication regimen adherence and staying in care with the Infectious  Disease Clinic by identifying barriers to care. RN will address the barriers that are identified by the patient. Patient Centered Goal is to have access to her  medications. She states she cannot afford her co-pays for her medications. Patient also stated she wants to secure housing for her and her kids. Currently she  is living with various family members and if that is not possible she is sleeping in her [place] required: No additional equipment needs at this time   g. Functional Limitations: No noted functional limitations but does suffer from  neuropathy  h. Rehabilitation potential: Guarded   i. Diet and Nutritional Needs: Regular Diet   j. Medications and treatments: Medications have been reconciled and reviewed and are a part of EPIC electronic file   k. Specific therapies if needed: RN Case Management  l. 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 Expected outcome: Guarded  During today's visit RN began to build a rapport with Beverly [name] and actively listened to her story. Beverly [name] stated her sister is the only family member that knows about her HIV status. Her 4 children are currently living with different friends and she does not feel welcome at her sisters home. The sister has a great amount of people already living in the home and the home is infested with spiders and roaches that continue to bite her. After receiving a good account of the many barriers to care RN offered assistance with obtaining her medications along with transporting her medications to her. RN also scheduled a appt with Dr Tommy Medal as well for the following week.

## 2016-05-25 ENCOUNTER — Telehealth: Payer: Self-pay | Admitting: *Deleted

## 2016-05-25 ENCOUNTER — Other Ambulatory Visit: Payer: Self-pay | Admitting: *Deleted

## 2016-05-25 DIAGNOSIS — F331 Major depressive disorder, recurrent, moderate: Secondary | ICD-10-CM

## 2016-05-25 MED ORDER — ESCITALOPRAM OXALATE 5 MG PO TABS: 5.0000 mg | ORAL_TABLET | Freq: Every day | ORAL | 3 refills | Status: DC

## 2016-05-25 NOTE — Telephone Encounter (Addendum)
RN received a email from [employer] stating he noted  I had been trying to get in contact with our mutual client(Beverly [name]). Mitch provided me with another contact number for the patient. I have added this contact number to epic along with sending a message to the patient via text message to the new number stating I am trying to get in contact with her. At this time I have not received a return message but will continue to try and connect with her  Purpose of this communication is to relay that that the set frequency for home visits this week has been changed due to a missed visit. Communication has been made with the patient to ensure needs have been met and to offer a home visit. At this time a return call has not been received before the business week is out. The [facility] Nurse plans to continue contact with the patient to offer any services or attempt to address any needs the patient expresses that is reasonable. Dr Tommy Medal has been contacted and made aware of this change in the plan of care and how the patient's needs have been met

## 2016-05-25 NOTE — Progress Notes (Signed)
RN contacted Khushi to arrange a home visit. Ilyanna stated she is doing well but wanted to know about the Lexapro that he and Dr Tommy Medal discussed. RN placed a order for Lexapro as per Dr Derek M[place] note and with his approval.

## 2016-05-25 NOTE — Telephone Encounter (Signed)
I approve of the care plan outlined below

## 2016-05-25 NOTE — Telephone Encounter (Signed)
Order received on 05/01/16 by Dr. Tommy Medal to evaluate patient for [facility]). Initial contact was made within 48hrs with evaluation conducted on 05/06/2016 for [facility]. Patient was consented to care at this time.   Frequency / Duration of [facility] visits: Effective 05/06/2016: 82mo1,3mo1, 35mo1, 4PRN's for complications with disease process/progression, medication changes or concerns  [facility] will assess for learning needs related to diagnosis and treatment regimen, provide education as needed, fill pill box if needed, address any barriers which may be preventing medication compliance, and communicating with care team including physician and case manager.   Individualized Plan Of Care, Certification Period 05/06/2016 to 08/04/2016   a. Type of service(s) and care to be delivered: [facility] Nurse   b. Frequency and duration of service: Effective 05/06/2016: 60mo1,3mo1, 23mo1, 4PRN's for complications with disease process/progression,  medication changes or concerns   c. Activity restrictions: Pt may be up as tolerated and can safely ambulate without the need for a assistive device   d. Safety Measures: Standard Precautions/Infection Control Measures  e. Service Objectives and Goals: Service Objectives are to assist the pt with HIV medication regimen adherence and staying in care with the Infectious  Disease Clinic by identifying barriers to care. RN will address the barriers that are identified by the patient. Patient Centered Goal is to have access to her  medications. She states she cannot afford her co-pays for her medications. Patient also stated she wants to secure housing for her and her kids. Currently she  is living with various family members and if that is not possible she is sleeping in her [place] required: No additional equipment needs at this time   g. Functional Limitations: No noted functional limitations but does suffer from  neuropathy  h. Rehabilitation potential: Guarded   i. Diet and Nutritional Needs: Regular Diet   j. Medications and treatments: Medications have been reconciled and reviewed and are a part of EPIC electronic file   k. Specific therapies if needed: RN Case Management  l. 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 Expected outcome: Guarded  During today's visit RN began to build a rapport with Beverly [name] and actively listened to her story. Beverly [name] stated her sister is the only family member that knows about her HIV status. Her 4 children are currently living with different friends and she does not feel welcome at her sisters home. The sister has a great amount of people already living in the home and the home is infested with spiders and roaches that continue to bite her. After receiving a good account of the many barriers to care RN offered assistance with obtaining her medications along with transporting her medications to her. RN also scheduled a appt with Dr Tommy Medal as well for the following week.

## 2016-05-26 NOTE — Telephone Encounter (Signed)
Apologize for the late update, but yes I did pick her medications up on Nov 16th and delivered the medications to her

## 2016-05-28 NOTE — Progress Notes (Signed)
After my last visit with the patient,  Harvin Hazel was able to get orders called into the pharmacy for refills of the patient's medications. RN traveled to [employer] and picked the medications up for the patient.  Currently I am the patient's home to deliver the medications. RN educated the patient on how to take her medications and reminded her of the appt time and date with Dr Lucianne Lei Dam.RN stayed with the patient as she discussed her many struggles with locating housing and stressors with her children. Patient stated at times she wants to give up but understands that he kids would be left alone without her. She denies any suicidal or homicidal ideations so a safety contract was not initiated.

## 2016-05-28 NOTE — Patient Instructions (Signed)
Medication management and adherence education provided during today's visit

## 2016-06-03 ENCOUNTER — Telehealth: Payer: Self-pay | Admitting: *Deleted

## 2016-06-10 ENCOUNTER — Ambulatory Visit: Payer: Medicaid Other | Admitting: *Deleted

## 2016-06-10 ENCOUNTER — Other Ambulatory Visit (HOSPITAL_COMMUNITY)
Admission: RE | Admit: 2016-06-10 | Discharge: 2016-06-10 | Disposition: A | Discharge status: Home / Self Care | Payer: Medicaid Other | Source: Ambulatory Visit | Attending: Infectious Disease | Admitting: Infectious Disease

## 2016-06-10 ENCOUNTER — Other Ambulatory Visit: Payer: Medicaid Other

## 2016-06-10 ENCOUNTER — Telehealth: Payer: Self-pay | Admitting: *Deleted

## 2016-06-10 DIAGNOSIS — Z113 Encounter for screening for infections with a predominantly sexual mode of transmission: Secondary | ICD-10-CM

## 2016-06-10 DIAGNOSIS — B20 Human immunodeficiency virus [HIV] disease: Secondary | ICD-10-CM

## 2016-06-10 DIAGNOSIS — Z79899 Other long term (current) drug therapy: Secondary | ICD-10-CM

## 2016-06-10 LAB — COMPLETE METABOLIC PANEL WITH GFR
ALBUMIN: 3.3 g/dL — AB (ref 3.6–5.1)
ALK PHOS: 80 U/L (ref 33–115)
ALT: 8 U/L (ref 6–29)
AST: 16 U/L (ref 10–30)
BILIRUBIN TOTAL: 0.5 mg/dL (ref 0.2–1.2)
BUN: 21 mg/dL (ref 7–25)
CO2: 22 mmol/L (ref 20–31)
CREATININE: 0.79 mg/dL (ref 0.50–1.10)
Calcium: 8.6 mg/dL (ref 8.6–10.2)
Chloride: 106 mmol/L (ref 98–110)
Glucose, Bld: 73 mg/dL (ref 65–99)
Potassium: 4.2 mmol/L (ref 3.5–5.3)
Sodium: 136 mmol/L (ref 135–146)
TOTAL PROTEIN: 8.1 g/dL (ref 6.1–8.1)

## 2016-06-10 LAB — LIPID PANEL
CHOL/HDL RATIO: 2 ratio (ref <5.0)
CHOLESTEROL: 146 mg/dL (ref <200)
HDL: 72 mg/dL (ref >50)
LDL Cholesterol: 63 mg/dL (ref <100)
TRIGLYCERIDES: 56 mg/dL (ref <150)
VLDL: 11 mg/dL (ref <30)

## 2016-06-10 LAB — CBC WITH DIFFERENTIAL/PLATELET
BASOS PCT: 0 %
Basophils Absolute: 0 cells/uL (ref 0–200)
EOS PCT: 14 %
Eosinophils Absolute: 588 cells/uL — ABNORMAL HIGH (ref 15–500)
HCT: 31.5 % — ABNORMAL LOW (ref 35.0–45.0)
Hemoglobin: 9.5 g/dL — ABNORMAL LOW (ref 11.7–15.5)
LYMPHS PCT: 18 %
Lymphs Abs: 756 cells/uL — ABNORMAL LOW (ref 850–3900)
MCH: 23.6 pg — AB (ref 27.0–33.0)
MCHC: 30.2 g/dL — ABNORMAL LOW (ref 32.0–36.0)
MCV: 78.2 fL — AB (ref 80.0–100.0)
MONOS PCT: 8 %
MPV: 9.9 fL (ref 7.5–12.5)
Monocytes Absolute: 336 cells/uL (ref 200–950)
NEUTROS ABS: 2520 {cells}/uL (ref 1500–7800)
Neutrophils Relative %: 60 %
PLATELETS: 306 10*3/uL (ref 140–400)
RBC: 4.03 MIL/uL (ref 3.80–5.10)
RDW: 18.9 % — AB (ref 11.0–15.0)
WBC: 4.2 10*3/uL (ref 3.8–10.8)

## 2016-06-11 ENCOUNTER — Other Ambulatory Visit: Payer: Self-pay | Admitting: Infectious Disease

## 2016-06-11 LAB — RPR

## 2016-06-11 LAB — T-HELPER CELL (CD4) - (RCID CLINIC ONLY)
CD4 T CELL HELPER: 1 % — AB (ref 33–55)
CD4 T Cell Abs: 10 /uL — ABNORMAL LOW (ref 400–2700)

## 2016-06-11 LAB — URINE CYTOLOGY ANCILLARY ONLY
Chlamydia: NEGATIVE
Neisseria Gonorrhea: NEGATIVE

## 2016-06-12 LAB — HIV-1 RNA QUANT-NO REFLEX-BLD
HIV 1 RNA QUANT: 49 {copies}/mL — AB (ref <20)
HIV-1 RNA Quant, Log: 1.69 Log copies/mL — ABNORMAL HIGH (ref <1.30)

## 2016-06-24 ENCOUNTER — Ambulatory Visit (INDEPENDENT_AMBULATORY_CARE_PROVIDER_SITE_OTHER): Payer: Medicaid Other | Admitting: Infectious Disease

## 2016-06-24 ENCOUNTER — Encounter: Payer: Self-pay | Admitting: Infectious Disease

## 2016-06-24 VITALS — BP 114/70 | HR 75 | Temp 97.9°F | Ht 61.0 in | Wt 169.0 lb

## 2016-06-24 DIAGNOSIS — B20 Human immunodeficiency virus [HIV] disease: Secondary | ICD-10-CM

## 2016-06-24 DIAGNOSIS — F332 Major depressive disorder, recurrent severe without psychotic features: Secondary | ICD-10-CM | POA: Diagnosis not present

## 2016-06-24 DIAGNOSIS — Z23 Encounter for immunization: Secondary | ICD-10-CM | POA: Diagnosis not present

## 2016-06-24 NOTE — Progress Notes (Signed)
Chief complaint: Follow up for HIV  Subjective:    Patient ID: Beverly [name], female    DOB: 1980/11/18, 36 y.o.   MRN: [id number]    Cough  Associated symptoms include a rash. Pertinent negatives include no chest pain, chills, fever, rhinorrhea, sore throat, shortness of breath or wheezing.    36 year old with HIV/AIDS who has been largely noncompliant except for period in January of 2013 where she had an undetectable viral load.  Again absent that period she has had largely uncontrolled viremia and low cd4.    She ha a very chaotic life currently, which she is struggling to stabilize. She had been  living in a car with several of her children a. Apparently she owed authorities locally over $6100 in unpaid dues including findings.  She has other legal problems as well including community service that she was supposed to serve--and which she has now completed.  Since I last saw her we put her back on [place] and DESCOVY and she is nicely suppressed her virus her CD4 count is come back from being undetectable to 10. She says she feels much better on the anti-retroviral medications. She did state that last week she passed out after having taken a dose of Lexapro this was the third day she took it and she felt flushed and passed out she has not taken it since then but is taking her other medications.    Past Medical History:  Diagnosis Date  . Abscess   . AIDS ([place]) 03/19/2015  . Anxiety   . Asthma   . Depression   . HIV infection ([place])   . Homeless 03/19/2015  . 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 Leg lesion 03/19/2015  . Major depression, recurrent ([place]) 03/19/2015  . 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 Skin ulcer ([place]) 05/06/2015    Past Surgical History:  Procedure Laterality Date  . CESAREAN SECTION    . INCISION AND DRAINAGE ABSCESS Right 01/10/2016   Procedure: INCISION AND DRAINAGE RIGHT BUTTOCK, LEFT LABIAL , EXCISION AND DRAINAGE OF  RIGHT AXILLARY ABSCESS;   Surgeon: Excell Seltzer, MD;  Location: [facility];  Service: General;  Laterality: Right;    Family History  Problem Relation Age of Onset  . Hypertension Other   . Cancer Other   . Diabetes Other   . Asthma Other   . Throat cancer Mother   . Throat cancer Father       Social History   Social History  . Marital status: Single    Spouse name: N/A  . Number of children: N/A  . Years of education: N/A   Social History Main Topics  . Smoking status: Current Every Day Smoker    Packs/day: 0.30    Years: 3.00    Types: Cigarettes  . Smokeless tobacco: Never Used  . Alcohol use 0.0 oz/week     Comment: 5-6 drinks a month.   . Drug use:     Frequency: 2.0 times per week    Types: Marijuana  . Sexual activity: No     Comment: pt. given condoms   Other Topics Concern  . None   Social History Narrative  . None    Allergies  Allergen Reactions  . Bee Venom Anaphylaxis, Shortness Of Breath and Swelling  . Morphine And Related Hives and Itching    Just can't take "morphine", vicodin is OK     Current Outpatient Prescriptions:  .  albuterol (PROVENTIL HFA;VENTOLIN  HFA) 108 (90 BASE) MCG/ACT inhaler, Inhale 2 puffs into the lungs every 6 (six) hours as needed for wheezing or shortness of breath., Disp: 1 Inhaler, Rfl: 6 .  azithromycin (ZITHROMAX) 600 MG tablet, Take 2 tablets (1,200 mg total) by mouth once a week., Disp: 8 tablet, Rfl: 5 .  cetirizine (ZYRTEC) 1 MG/ML syrup, Take 5 mLs (5 mg total) by mouth 2 (two) times daily., Disp: 118 mL, Rfl: 12 .  dolutegravir (TIVICAY) 50 MG tablet, Take 1 tablet (50 mg total) by mouth daily., Disp: 30 tablet, Rfl: 11 .  dronabinol (MARINOL) 5 MG capsule, Take 1 capsule (5 mg total) by mouth 2 (two) times daily before a meal., Disp: 60 capsule, Rfl: 2 .  emtricitabine-tenofovir AF (DESCOVY) 200-25 MG tablet, Take 1 tablet by mouth daily., Disp: 30 tablet, Rfl: 11 .  ferrous sulfate 325 (65 FE) MG tablet, Take 1 tablet (325 mg total)  by mouth daily with breakfast., Disp: 30 tablet, Rfl: 0 .  hydrocerin (EUCERIN) CREA, Apply 1 application topically 2 (two) times daily. (Patient taking differently: Apply 1 application topically 2 (two) times daily as needed (dryness). ), Disp: 454 g, Rfl: 0 .  sulfamethoxazole-trimethoprim (BACTRIM DS,SEPTRA DS) 800-160 MG tablet, Take 1 tablet by mouth daily., Disp: 30 tablet, Rfl: 11 .  Tetrahydrozoline HCl (VISINE OP), Apply 1-2 drops to eye 4 (four) times daily as needed (dryness.)., Disp: , Rfl:     Review of Systems  Constitutional: Positive for fatigue. Negative for activity change, chills, diaphoresis, fever and unexpected weight change.  HENT: Negative for congestion, rhinorrhea, sinus pressure, sneezing, sore throat and trouble swallowing.   Eyes: Negative for photophobia and visual disturbance.  Respiratory: Negative for chest tightness, shortness of breath, wheezing and stridor.   Cardiovascular: Negative for chest pain, palpitations and leg swelling.  Gastrointestinal: Negative for abdominal distention, abdominal pain, anal bleeding, blood in stool, constipation, diarrhea and vomiting.  Genitourinary: Negative for difficulty urinating, dysuria, flank pain and hematuria.  Musculoskeletal: Negative for arthralgias, back pain, gait problem and joint swelling.  Skin: Positive for color change, rash and wound. Negative for pallor.  Neurological: Positive for dizziness. Negative for tremors, weakness and light-headedness.  Hematological: Negative for adenopathy. Does not bruise/bleed easily.  Psychiatric/Behavioral: Negative for agitation, behavioral problems, confusion and self-injury.       Objective:   Physical Exam  Constitutional: She is oriented to person, place, and time. No distress.  HENT:  Head: Normocephalic and atraumatic.  Mouth/Throat: No oropharyngeal exudate.  Eyes: Conjunctivae and EOM are normal. No scleral icterus.  Neck: Normal range of motion. Neck supple.    Cardiovascular: Normal rate, regular rhythm and normal heart sounds.  Exam reveals no gallop and no friction rub.   No murmur heard. Pulmonary/Chest: Effort normal. No respiratory distress. She has no wheezes.  Abdominal: She exhibits no distension.  Musculoskeletal: She exhibits no edema.  Neurological: She is alert and oriented to person, place, and time. She exhibits normal muscle tone. Coordination normal.  Skin: Skin is warm and dry. Lesion noted. No rash noted. She is not diaphoretic. No pallor.  Psychiatric: She has a normal mood and affect. Her behavior is normal. Judgment and thought content normal.         Assessment & Plan:     HIV/AIDS: I congratulated her on her excellent adherence and asked her to please keep up the good work and continue the [employer], Tivicay and  Bactrim to daily. She will come back and see me  in about 2 weeks on the 18th she's being followed by [employer] and also assisted by Mitch  Depression,: She believes she may have had an allergic reaction to the Lexapro so we'll taken away for now will add back an antidepressant a later date.  Homeless: She is apparently looking for an apartment with her brother to share with him.   I spent greater than 25  minutes with the patient including greater than 50% of time in face to face counsel of the patient re her HIV, AIDS,  ,legal problems in coordination of her care.  Alcide Evener, MD

## 2016-06-25 ENCOUNTER — Ambulatory Visit: Payer: Medicaid Other

## 2016-06-26 ENCOUNTER — Telehealth: Payer: Self-pay | Admitting: *Deleted

## 2016-07-08 ENCOUNTER — Ambulatory Visit: Payer: Medicaid Other

## 2016-07-08 ENCOUNTER — Ambulatory Visit: Payer: Medicaid Other | Admitting: Infectious Disease

## 2016-07-22 ENCOUNTER — Telehealth: Payer: Self-pay

## 2016-07-22 NOTE — Telephone Encounter (Signed)
Left message for Beverly [name] to call our office.  I want to praise her for the excellent job she has done with taking her medications and becoming undetectable!  Laverle Patter, RN

## 2016-07-22 NOTE — Telephone Encounter (Signed)
-----   Message from Truman Hayward, MD sent at 06/13/2016  9:32 AM EST ----- SUCCESS WITH SHERIIKA VL down to 49!!! We should congratulate her and praise her and encourage to keep this up

## 2016-08-10 ENCOUNTER — Ambulatory Visit: Payer: Medicaid Other

## 2016-08-10 NOTE — BH Specialist Note (Signed)
I met with Beverly [name] for the first time in a couple of years and she reports multiple stressors, including housing, job, transportation, and issues with her children. She was tearful at times and admitted to some suicidal ideation, but contracted for safety. She is working with case Freight forwarder, [place] and Sebree from [place]. Plan to meet again in one week. Curley Spice, LCSW

## 2016-08-13 ENCOUNTER — Telehealth: Payer: Self-pay | Admitting: *Deleted

## 2016-08-13 NOTE — Telephone Encounter (Addendum)
RN contacted the patient in a attempt to stay connected with her. RN sent a message asking how things are going, if she needed any help with her medications or anything else that I am here to help  [facility] orders have expired effective 08/05/16 but GOALS have not been completely meet at this time. I would like to connect with the patient and received further orders from Dr Tommy Medal to continue care with [facility]. If I am unable to get in contact with her within the next 30 days I will have to discharge at that time. RN WILL NOT RESUME CARE UNTIL NEW MD ORDERS OBTAINED AND PATIENT ABLE/WILLING TO RE-ENGAGE IN CARE

## 2016-08-20 ENCOUNTER — Ambulatory Visit: Payer: Medicaid Other

## 2016-09-01 NOTE — Telephone Encounter (Signed)
Will do Dr Lucianne Lei Da. I appreciate you.

## 2016-09-01 NOTE — Telephone Encounter (Signed)
I would like it if you continue to be engaged with her. We DID make progress with her based on her VL's.

## 2016-09-02 NOTE — Telephone Encounter (Signed)
Thank you :)

## 2016-09-14 NOTE — Telephone Encounter (Signed)
Purpose of this communication is to relay that that the set frequency for home visits this week has been changed due to a missed visit. Communication has been made with the patient to ensure needs have been met and to offer a home visit. At this time a return call has not been received before the business week is out. The [facility] Nurse plans to continue contact with the patient to offer any services or attempt to address any needs the patient expresses that is reasonable. Dr Van Dam has been contacted and made aware of this change in the plan of care and how the patient's needs have been met 

## 2016-09-14 NOTE — Telephone Encounter (Signed)
Several calls have been placed to [name] without a return call at this time. Beverly [name] and myself have tried to stay in contact with Beverly [name] to offer support during this difficult time.   Purpose of this communication is to relay that that Beverly set frequency for home visits this week has been changed due to a missed visit. Communication has been made with Beverly [name] to ensure needs have been met and to offer a home visit. At this time a return call has not been received before Beverly business week is out. [facility] Nurse plans to continue contact with Beverly [name] to offer any services or attempt to address any needs Beverly [name] expresses that is reasonable. Dr. Tommy Medal has been contacted and made aware of this change in Beverly plan of care and how Beverly [name]'s needs have been met

## 2016-09-14 NOTE — Telephone Encounter (Signed)
Purpose of this communication is to relay that that the set frequency for home visits this week has been changed due to a missed visit. Communication has been made with the patient to ensure needs have been met and to offer a home visit. At this time a return call has not been received before the business week is out. The [place] Nurse plans to continue contact with the patient to offer any services or attempt to address any needs the patient expresses that is reasonable. Dr Tommy Medal has been contacted and made aware of this change in the plan of care and how the patient's needs have been met

## 2016-09-16 ENCOUNTER — Ambulatory Visit: Payer: Medicaid Other | Admitting: Infectious Disease

## 2016-09-28 ENCOUNTER — Telehealth: Payer: Self-pay | Admitting: *Deleted
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
## 2016-09-30 ENCOUNTER — Ambulatory Visit: Payer: Self-pay | Admitting: *Deleted

## 2016-09-30 ENCOUNTER — Telehealth: Payer: Self-pay | Admitting: *Deleted

## 2016-09-30 DIAGNOSIS — B20 Human immunodeficiency virus [HIV] disease: Secondary | ICD-10-CM

## 2016-09-30 NOTE — Telephone Encounter (Addendum)
RN contacted [facility] and requested a refill of the patient's Bactrim, Zithromax, Tivicay and Descovy. Pharmacy rep stated the medications have not been filled by [facility] since Nov and Dec of 2017. RN will get the medications to the patient  Order received on 08/13/16 by Dr. Tommy Medal continue to make attempts to offer assistance to patient for medications adherence.    Frequency / Duration of [facility] visits: Effective 08/13/16: 65mo1,3mo1, 30mo1, 4PRN's for complications with disease process/progression, medication changes or concerns  [facility] will assess for learning needs related to diagnosis and treatment regimen, provide education as needed, fill pill box if needed, address any barriers which may be preventing medication compliance, and communicating with care team including physician and case manager.   Individualized Plan Of Care, Certification Period of 08/13/16 to 11/11/2016   a. Type of service(s) and care to be delivered: [facility] Nurse   b. Frequency and duration of service: Effective 08/13/16: 61mo1,3mo1, 86mo1, 4PRN's for complications with disease process/progression,  medication changes or concerns   c. Activity restrictions: Pt may be up as tolerated and can safely ambulate without the need for a assistive device   d. Safety Measures: Standard Precautions/Infection Control Measures  e. Service Objectives and Goals: Service Objectives are to assist the pt with HIV medication regimen adherence and staying in care with the [facility] by identifying barriers to care. RN will address the barriers that are identified by the patient. Patient Centered Goal is to have access to her medications. She states she cannot afford her co-pays for her medications. Patient also stated she wants to secure housing for her and her kids. Currently she  is living with various family members and if that is not possible she is sleeping in her [place] required: No  additional equipment needs at this time   g. Functional Limitations: No noted functional limitations but does suffer from neuropathy  h. Rehabilitation potential: Guarded   i. Diet and Nutritional Needs: Regular Diet   j. Medications and treatments: Medications have been reconciled and reviewed and are a part of EPIC electronic file   k. Specific therapies if needed: RN Case Management  l. 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 Expected outcome: Guarded

## 2016-10-14 NOTE — Addendum Note (Signed)
Addended by: Lorne Skeens D on: 10/14/2016 02:54 PM   Modules accepted: Orders

## 2016-11-02 ENCOUNTER — Other Ambulatory Visit: Payer: Medicaid Other

## 2016-11-02 NOTE — Progress Notes (Signed)
Rn arranged a visit with the patient prior to driving to the home. RN was able to collect the patient's medications to treat her HIV and contacted [place] to make her aware that I am on the way. Geraldyne stated she just left but that he Dad is home so I can leave the medications with him.  RN arrived at the home and after knocking several times and waiting for over 2 minutes for someone to open the door, RN contacted Gladies who stated it would be ok to leave the medications in her mailbox. RN placed the medications in the mailbox as instructed.

## 2016-11-02 NOTE — Progress Notes (Signed)
Rn arranged a visit with the patient prior to driving to the home. RN was able to collect the patient's medications to treat her HIV and contacted Adiah to make her aware that I am on the way. Josiah stated she just left but that he Dad is home so I can leave the medications with him.  RN arrived at the home and after knocking several times and waiting for over 2 minutes for someone to open the door, RN contacted Afomia who stated it would be ok to leave the medications in her mailbox. RN placed the medications in the mailbox as instructed.

## 2016-11-11 ENCOUNTER — Encounter: Payer: Self-pay | Admitting: Infectious Disease

## 2016-11-11 ENCOUNTER — Ambulatory Visit: Payer: Self-pay | Admitting: *Deleted

## 2016-11-11 ENCOUNTER — Ambulatory Visit (INDEPENDENT_AMBULATORY_CARE_PROVIDER_SITE_OTHER): Payer: Medicaid Other | Admitting: Licensed Clinical Social Worker

## 2016-11-11 ENCOUNTER — Ambulatory Visit (INDEPENDENT_AMBULATORY_CARE_PROVIDER_SITE_OTHER): Payer: Medicaid Other | Admitting: Infectious Disease

## 2016-11-11 ENCOUNTER — Telehealth: Payer: Self-pay | Admitting: *Deleted

## 2016-11-11 VITALS — BP 119/81 | HR 75 | Temp 98.2°F | Ht 61.0 in | Wt 178.0 lb

## 2016-11-11 DIAGNOSIS — B20 Human immunodeficiency virus [HIV] disease: Secondary | ICD-10-CM

## 2016-11-11 DIAGNOSIS — F332 Major depressive disorder, recurrent severe without psychotic features: Secondary | ICD-10-CM | POA: Diagnosis not present

## 2016-11-11 DIAGNOSIS — Z5900 Homelessness unspecified: Secondary | ICD-10-CM

## 2016-11-11 DIAGNOSIS — F321 Major depressive disorder, single episode, moderate: Secondary | ICD-10-CM

## 2016-11-11 LAB — COMPLETE METABOLIC PANEL WITH GFR
ALT: 8 U/L (ref 6–29)
AST: 16 U/L (ref 10–30)
Albumin: 3.3 g/dL — ABNORMAL LOW (ref 3.6–5.1)
Alkaline Phosphatase: 90 U/L (ref 33–115)
BILIRUBIN TOTAL: 0.3 mg/dL (ref 0.2–1.2)
BUN: 16 mg/dL (ref 7–25)
CO2: 20 mmol/L (ref 20–31)
CREATININE: 0.6 mg/dL (ref 0.50–1.10)
Calcium: 8.4 mg/dL — ABNORMAL LOW (ref 8.6–10.2)
Chloride: 110 mmol/L (ref 98–110)
GFR, Est African American: >89 mL/min (ref >=60)
GLUCOSE: 83 mg/dL (ref 65–99)
Potassium: 3.8 mmol/L (ref 3.5–5.3)
SODIUM: 139 mmol/L (ref 135–146)
TOTAL PROTEIN: 7.6 g/dL (ref 6.1–8.1)

## 2016-11-11 LAB — CBC WITH DIFFERENTIAL/PLATELET
BASOS ABS: 0 {cells}/uL (ref 0–200)
BASOS PCT: 0 %
EOS ABS: 196 {cells}/uL (ref 15–500)
Eosinophils Relative: 7 %
HEMATOCRIT: 33.5 % — AB (ref 35.0–45.0)
Hemoglobin: 10.3 g/dL — ABNORMAL LOW (ref 11.7–15.5)
Lymphocytes Relative: 36 %
Lymphs Abs: 1008 cells/uL (ref 850–3900)
MCH: 23.7 pg — ABNORMAL LOW (ref 27.0–33.0)
MCHC: 30.7 g/dL — ABNORMAL LOW (ref 32.0–36.0)
MCV: 77 fL — AB (ref 80.0–100.0)
MONO ABS: 308 {cells}/uL (ref 200–950)
MONOS PCT: 11 %
MPV: 9.3 fL (ref 7.5–12.5)
NEUTROS ABS: 1288 {cells}/uL — AB (ref 1500–7800)
Neutrophils Relative %: 46 %
PLATELETS: 221 10*3/uL (ref 140–400)
RBC: 4.35 MIL/uL (ref 3.80–5.10)
RDW: 19.8 % — ABNORMAL HIGH (ref 11.0–15.0)
WBC: 2.8 10*3/uL — ABNORMAL LOW (ref 3.8–10.8)

## 2016-11-11 MED ORDER — BICTEGRAVIR-EMTRICITAB-TENOFOV 50-200-25 MG PO TABS: 1.0000 | ORAL_TABLET | Freq: Every day | ORAL | 11 refills | Status: DC

## 2016-11-11 MED FILL — BIKTARVY 50-200-25 MG TABS: 50-200-25 | 30 days supply | Qty: 30 | Fill #0
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
## 2016-11-11 NOTE — Patient Instructions (Signed)
We will get labs today  We will switch to BIKTARVY a ONE PILL ONCE A DAY MEDICINE FOR YOUR HIV  ONCE YOU GET THE BIKTARVY STOP THE TIVICAY (little yellow pill) AND DESCOVY (little blue pill)   Continue to work with [facility] and [facility]  I want you to come back and see ID pharmacy, [place] or [place] in next 2-3 weeks and then me in late June

## 2016-11-11 NOTE — Progress Notes (Signed)
I surely can. If she is ok with it I can get the Biktarvy to her today

## 2016-11-12 ENCOUNTER — Telehealth: Payer: Self-pay | Admitting: *Deleted

## 2016-11-12 LAB — T-HELPER CELL (CD4) - (RCID CLINIC ONLY)
CD4 % Helper T Cell: 5 % — ABNORMAL LOW (ref 33–55)
CD4 T CELL ABS: 51 /uL — AB (ref 400–2700)

## 2016-11-12 LAB — RPR

## 2016-11-15 LAB — HIV RNA, RTPCR W/R GT (RTI, PI,INT)
HIV-1 RNA, QN PCR: 4.81 {Log_copies}/mL — AB
HIV-1 RNA, QN PCR: 64100 {copies}/mL — AB

## 2016-11-18 ENCOUNTER — Ambulatory Visit: Payer: Medicaid Other

## 2016-11-21 LAB — HIV-1 GENOTYPE: HIV-1 Genotype: DETECTED — AB

## 2016-11-23 NOTE — Telephone Encounter (Signed)
RN contacted Beverly [name] who stated it would be ok to come by her home and bring the new medication Biktarvy. Teosha stated she is currently not home but he Dad should be there to open to door

## 2016-11-24 LAB — RFLX HIV-1 INTEGRASE GENOTYPE

## 2016-11-26 NOTE — Progress Notes (Signed)
RN received a message from Dr Tommy Medal asking for assistance with getting Beverly [name] her new medications. RN contacted Beverly [name] who stated she will not be home for a visit but I can leave the medication in her mailbox.  Traveled to [facility] and was able to get the patient's Biktarvy at no cost to the patient.  Traveled to the patient's home and placed the pharmacy bag/Biktarvy in the patient's mailbox. RN contacted the patient to make her aware that I just placed her medications in her mailbox. Beverly [name] stated she will be home shortly and will get her medications. Beverly [name] thanked me for the help

## 2016-11-30 NOTE — Telephone Encounter (Signed)
  Frequency / Duration of [facility] visits: Effective 11/12/16: 82mo1,3mo1, 64mo1, 4PRN's for complications with disease process/progression, medication changes or concerns  [facility] will assess for learning needs related to diagnosis and treatment regimen, provide education as needed, fill pill box if needed, address any barriers which may be preventing medication compliance, and communicating with care team including physician and case manager.   Individualized Plan Of Care, Certification Period of 11/12/16 to 02/10/2017   a. Type of service(s) and care to be delivered: [facility] Nurse   b. Frequency and duration of service: Effective 11/12/16: 22mo1,3mo1, 29mo1, 4PRN's for complications with disease process/progression,  medication changes or concerns   c. Activity restrictions: Pt may be up as tolerated and can safely ambulate without the need for a assistive device   d. Safety Measures: Standard Precautions/Infection Control Measures  e. Service Objectives and Goals: Service Objectives are to assist the pt with HIV medication regimen adherence and staying in care with the Infect[facility] Disease Clinic by identifying barriers to care. RN will address the barriers that are identified by the patient. Patient Centered Goal is to have access to her medications. She states she cannot afford her co-pays for her medications. Patient also stated she wants to secure housing for her and her kids. Currently she  is living with various family members and if that is not possible she is sleeping in her [place] required: No additional equipment needs at this time   g. Functional Limitations: No noted functional limitations but does suffer from neuropathy  h. Rehabilitation potential: Guarded   i. Diet and Nutritional Needs: Regular Diet   j. Medications and treatments: Medications have been reconciled and reviewed and are a part of EPIC electronic file   k. Specific therapies if needed: RN Case  Management  l. 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 Expected outcome: Guarded

## 2016-12-01 NOTE — Telephone Encounter (Signed)
I approve of this plan. Thanks so much [name]!

## 2016-12-01 NOTE — Telephone Encounter (Signed)
I approve wholeheartedly of the plan below.

## 2016-12-02 NOTE — Progress Notes (Signed)
Name: Beverly [name] MRN: [id number]  Date: 11/11/16  Time started: 11:30 am Time ended: 12:30 pm  Behavior: Tearful and somber; Cooperative Mood: Depressed  Affect: Within range Thought Process: Rumination Thought Content: Logical Thought Disorder and Perceptual Anomalies: None observed or self-reported S/I and H/I: General non-specific thoughts of death  Referral Source: Dr. Tommy Medal  Content of Session: Met with patient based on referral from provider, due to depressive statements and symptoms.  Intervention Provided: Gathered history of mental health treatment, admissions, and medications.  Performed assessment of symptoms.  Educated patient on treatment options.  Provided patient with food and hygiene items from the food pantry.  Provided patient with list of emergency shelters and with emergency mobile crisis and suicide numbers.  Response: Patient reported having a history of experiencing depressive and anxiety symptoms and being prescribed Zoloft and Xanax.  Patient reported that she stopped taking the Xanax medications because she was feeling better but was unable to verbalize why she stopped receiving treatment.  The patient presented as tearful throughout the session and reported that she was homeless, although she and her children are living at her sister's house. Patient denied using substances currently.  The patient reported the following symptoms: depressed mood ("overwhelmed" and "always angry and upset"); crying all the time, that is getting worse; feeling hopeless; decreased sleep (2-3 hours/night); decreased appetite (going 2-3 days without eating); anhedonia; decreased energy; social isolation; decreased concentration and increased distractibility with rumination on her lack of resources; and general thoughts of death.  The patient stated, "Sometimes I wish I didn't wake up" because of her housing situation, but denied specific thoughts, plan, or intent.  The patient stated, "I  wouldn't hurt myself".  Patient was receptive to receiving mental health services and was receptive to going to Beverly [name] for medication management and counseling services for greater collaboration of care.  Patient was grateful for the items received and was grateful for the list of providers of hot meals and other food banks in the community.  Patient was also grateful for the list of emergency shelters provided.  Plan:  Patient was provided with list of walk-in mental health agencies and was asked to report to Beverly [name] tomorrow morning for assessment.  Patient will seek food assistance from community food banks and report to soup kitchens as necessary.  Patient will use the list of emergency shelters to locate temporary housing.  Patient will continue to work with Beverly [name] and Beverly [name] on locating more permanent housing. Patient will use the emergency mobile crisis and suicide numbers if develop specific suicidal ideations or require assistance after hours before appointment with Beverly [name].  Sande Rives, [facility]

## 2016-12-21 ENCOUNTER — Telehealth: Payer: Self-pay | Admitting: *Deleted

## 2016-12-21 NOTE — Telephone Encounter (Signed)
RN contacted the patient to check in with her and offer any assistance. Left text message with the patient stating I noticed a change in her address and would like to check in with her

## 2016-12-24 ENCOUNTER — Ambulatory Visit: Payer: Medicaid Other | Admitting: Infectious Disease

## 2017-01-04 MED FILL — BIKTARVY 50-200-25 MG TABS: 50-200-25 | 30 days supply | Qty: 30 | Fill #1

## 2017-01-20 ENCOUNTER — Telehealth: Payer: Self-pay | Admitting: *Deleted

## 2017-01-20 NOTE — Telephone Encounter (Signed)
Left a message asking Beverly [name] to return my call. She may be out of her medications and I would like to be sure she has them.

## 2017-02-25 ENCOUNTER — Telehealth: Payer: Self-pay | Admitting: *Deleted

## 2017-02-25 NOTE — Telephone Encounter (Signed)
Contacted the patient and left a message asking the patient to please return my call I would like to offer assistance. Asked the patient if she needs a call in of her medications

## 2017-03-11 ENCOUNTER — Other Ambulatory Visit: Payer: Self-pay | Admitting: Pharmacist

## 2017-03-17 ENCOUNTER — Telehealth: Payer: Self-pay | Admitting: *Deleted

## 2017-03-17 NOTE — Telephone Encounter (Signed)
Contacted Akaila and left a message stating I have not seen or heard from her in a while and would love to know how things are going and offer assistance if needed. Waiting on a return call

## 2017-03-22 ENCOUNTER — Telehealth: Payer: Self-pay | Admitting: Pharmacy Technician

## 2017-03-22 NOTE — Telephone Encounter (Signed)
I and [facility] pharmacy staff called Beverly [name] multiple times to refill HIV medication without success.  She picked up in May 2018 and Beverly [name] took her medication to her in July.  She has been called multiple times since.  She was reached in September and stated she would pick up, but never did.  She is homeless so mailing the med, she felt, was not an option.

## 2017-04-22 ENCOUNTER — Telehealth: Payer: Self-pay | Admitting: *Deleted

## 2017-04-22 MED FILL — BIKTARVY 50-200-25 MG TABS: 50-200-25 | 30 days supply | Qty: 30 | Fill #2

## 2017-04-22 NOTE — Telephone Encounter (Signed)
[facility] and [facility] stated [facility] has not been refillled in over 70 days. Asked for the medications to be refilled and I contacted [place] and asked her if I could drop her medications off for her. Waiting on a response

## 2017-04-23 ENCOUNTER — Ambulatory Visit: Payer: Self-pay | Admitting: *Deleted

## 2017-04-23 DIAGNOSIS — F32A Depression, unspecified: Secondary | ICD-10-CM

## 2017-04-23 DIAGNOSIS — B20 Human immunodeficiency virus [HIV] disease: Secondary | ICD-10-CM

## 2017-04-23 DIAGNOSIS — F329 Major depressive disorder, single episode, unspecified: Secondary | ICD-10-CM

## 2017-05-04 ENCOUNTER — Telehealth: Payer: Self-pay | Admitting: *Deleted

## 2017-05-04 NOTE — Telephone Encounter (Signed)
Sent Demetri a text message stating I would like to know if we can make her an appt to see the pharmacist. We would like to be sure the medication is working well for her. Asked if a specific date would work better for her?

## 2017-06-02 NOTE — Progress Notes (Signed)
I have made several attempts to reach the patient and was able to get a response back from Beverly [name] via text. RN asked Beverly [name] if she needs her medications and she responded "yes Please". I contacted [name] and asked if we can go ahead and refill Beverly [name]'s medications. I traveled to [employer] and picked the medications up. Contacted Beverly [name] who stated she will not be home and I can leave the medications at her sister's home and she will go by and pick the medications up. Drove to the sisters home and took the medications to Beverly [name]'s sister Beverly [name]. Contacted Beverly [name] to make her aware that he medications are at her sisters home. Beverly [name] made me aware that she has a new job and [employer]. She stated it not much and I replied by stating jokingly "the money is green and its legal, I'm so proud of you" Beverly [name] stated "thanks and that she has really been down and needed to hear that"

## 2017-06-25 NOTE — Telephone Encounter (Signed)
The intent of this communication is to inform the Health Care Team that this patient will be discharged from [facility]).  Greater than 3 attempts have been made to re-engage the patient  without any success .Moving forward, the [facility] will be willing to reopen the patient to services if and when the patient is ready to discuss medication adherence and HIV disease  management. Effective 05/07/17  patient will be discharged and removed from [facility] active patient listing.

## 2017-06-28 NOTE — Telephone Encounter (Signed)
Thanks so much Beverly [name]. A pity, we can only do what we can. She only sporadically engages unfortunately

## 2017-09-07 ENCOUNTER — Encounter (HOSPITAL_COMMUNITY): Payer: Self-pay | Admitting: Emergency Medicine

## 2017-09-07 ENCOUNTER — Other Ambulatory Visit: Payer: Self-pay

## 2017-09-07 DIAGNOSIS — J45909 Unspecified asthma, uncomplicated: Secondary | ICD-10-CM | POA: Diagnosis not present

## 2017-09-07 DIAGNOSIS — F17210 Nicotine dependence, cigarettes, uncomplicated: Secondary | ICD-10-CM | POA: Diagnosis not present

## 2017-09-07 DIAGNOSIS — Y92511 Restaurant or cafe as the place of occurrence of the external cause: Secondary | ICD-10-CM | POA: Diagnosis not present

## 2017-09-07 DIAGNOSIS — S6991XA Unspecified injury of right wrist, hand and finger(s), initial encounter: Secondary | ICD-10-CM | POA: Diagnosis present

## 2017-09-07 DIAGNOSIS — Y9389 Activity, other specified: Secondary | ICD-10-CM | POA: Insufficient documentation

## 2017-09-07 DIAGNOSIS — Z79899 Other long term (current) drug therapy: Secondary | ICD-10-CM | POA: Diagnosis not present

## 2017-09-07 DIAGNOSIS — X509XXA Other and unspecified overexertion or strenuous movements or postures, initial encounter: Secondary | ICD-10-CM | POA: Diagnosis not present

## 2017-09-07 DIAGNOSIS — S63501A Unspecified sprain of right wrist, initial encounter: Secondary | ICD-10-CM | POA: Diagnosis not present

## 2017-09-07 DIAGNOSIS — Y999 Unspecified external cause status: Secondary | ICD-10-CM | POA: Diagnosis not present

## 2017-09-07 NOTE — ED Triage Notes (Signed)
Pt states she was at work 3 weeks ago and she works at a drive thru and started having pain in her right wrist States she went to swipe a card and felt a sharp pop in her wrist and pain shot up her arm   Pt states since she has been having pain in her wrist and numbness in her fingers  Pt states today it feels swollen and she cannot use her hand, hold a cup or write

## 2017-09-07 NOTE — ED Triage Notes (Signed)
PT CALLED FOR RECHECK OF V/S NO RESPONSE

## 2017-09-07 NOTE — ED Notes (Signed)
Pt called  No response from lobby  

## 2017-09-08 ENCOUNTER — Emergency Department (HOSPITAL_COMMUNITY)
Admission: EM | Admit: 2017-09-08 | Discharge: 2017-09-08 | Disposition: A | Discharge status: Home / Self Care | Payer: Medicaid Other | Attending: Emergency Medicine | Admitting: Emergency Medicine

## 2017-09-08 DIAGNOSIS — S63501A Unspecified sprain of right wrist, initial encounter: Secondary | ICD-10-CM

## 2017-09-08 HISTORY — DX: Encounter for other specified aftercare: Z51.89

## 2017-09-08 MED ORDER — IBUPROFEN 600 MG PO TABS: 600.0000 mg | ORAL_TABLET | Freq: Four times a day (QID) | ORAL | 0 refills | Status: DC | PRN

## 2017-09-08 MED ORDER — IBUPROFEN 200 MG PO TABS
600.0000 mg | ORAL_TABLET | Freq: Once | ORAL | Status: AC
  Administered 2017-09-08: 600 mg via ORAL
  Filled 2017-09-08: qty 3

## 2017-09-08 NOTE — ED Notes (Signed)
Pt to registration window and states she has been asleep and missed her name  Pt informed she would have to wait for next room available

## 2017-09-08 NOTE — ED Provider Notes (Signed)
[facility] DEPT Provider Note   CSN: [id number] Arrival date & time: 09/07/17  1953     History   Chief Complaint Chief Complaint  Patient presents with  . Hand Pain  . Arm Pain    HPI Beverly [name] is a 37 y.o. female.  Pain in right wrist x 3 weeks with swelling. 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 She states her pain has been persistent since that time with progressive swelling. No numbness.    The history is provided by the patient. No language interpreter was used.    Past Medical History:  Diagnosis Date  . Abscess   . AIDS ([place]) 03/19/2015  . Anxiety   . Asthma   . Blood transfusion without reported diagnosis   . Depression   . HIV infection ([place])   . Homeless 03/19/2015  . 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 Leg lesion 03/19/2015  . Major depression, recurrent ([place]) 03/19/2015  . 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 Skin ulcer ([place]) 05/06/2015    Patient Active Problem List   Diagnosis Date Noted  . 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 AIDS (acquired immune deficiency syndrome) ([place])   . Pancytopenia ([place]) 01/09/2016  . Abscess of right buttock 01/09/2016  . AIDS ([place]) 03/19/2015  . Major depression, recurrent ([place]) 03/19/2015  . Homeless 03/19/2015  . 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 Hypokalemia 08/13/2014  . Depression 10/17/2013  . Anemia 10/17/2013  . Thrombocytopenia ([place]) 10/17/2013  . Cigarette smoker 10/17/2013  . Asthma 10/17/2013  . Encounter for general adult medical examination without abnormal findings 06/12/2011  . Anxiety state 07/17/2008  . Human immunodeficiency virus (HIV) disease ([place]) 06/28/2008    Past Surgical History:  Procedure Laterality Date  . CESAREAN SECTION    . INCISION AND DRAINAGE ABSCESS Right 01/10/2016   Procedure: INCISION AND DRAINAGE RIGHT BUTTOCK, LEFT LABIAL , EXCISION AND DRAINAGE OF  RIGHT AXILLARY ABSCESS;   Surgeon: Excell Seltzer, MD;  Location: [facility] ORS;  Service: General;  Laterality: Right;    OB History    Gravida Para Term Preterm AB Living   1             SAB TAB Ectopic Multiple Live Births                   Home Medications    Prior to Admission medications   Medication Sig Start Date End Date Taking? Authorizing Provider  albuterol (PROVENTIL HFA;VENTOLIN HFA) 108 (90 BASE) MCG/ACT inhaler Inhale 2 puffs into the lungs every 6 (six) hours as needed for wheezing or shortness of breath. Patient not taking: Reported on 09/28/2016 05/06/15   Tommy Medal, Lavell Islam, MD  azithromycin (ZITHROMAX) 600 MG tablet Take 2 tablets (1,200 mg total) by mouth once a week. Patient not taking: Reported on 09/28/2016 05/06/16   Tommy Medal, Lavell Islam, MD  bictegravir-emtricitabine-tenofovir AF (BIKTARVY) 50-200-25 MG TABS tablet Take 1 tablet by mouth daily. 11/11/16   Truman Hayward, MD  cetirizine (ZYRTEC) 1 MG/ML syrup Take 5 mLs (5 mg total) by mouth 2 (two) times daily. Patient not taking: Reported on 09/28/2016 11/01/15   Domenic Moras, PA-C  dronabinol (MARINOL) 5 MG capsule Take 1 capsule (5 mg total) by mouth 2 (two) times daily before a meal. Patient not taking: Reported on 09/28/2016 03/25/15   Tommy Medal, Lavell Islam, MD  hydrocerin (EUCERIN) CREA Apply  1 application topically 2 (two) times daily. Patient not taking: Reported on 09/28/2016 03/05/15   Michel Bickers, MD  sulfamethoxazole-trimethoprim (BACTRIM DS,SEPTRA DS) 800-160 MG tablet Take 1 tablet by mouth daily. Patient not taking: Reported on 09/28/2016 05/06/16   Tommy Medal, Lavell Islam, MD  Tetrahydrozoline HCl (VISINE OP) Apply 1-2 drops to eye 4 (four) times daily as needed (dryness.).    [provider]    Family History Family History  Problem Relation Age of Onset  . Throat cancer Mother   . Throat cancer Father   . Hypertension Other   . Cancer Other   . Diabetes Other   . Asthma Other     Social History Social History    Tobacco Use  . Smoking status: Current Every Day Smoker    Packs/day: 0.30    Years: 3.00    Pack years: 0.90    Types: Cigarettes  . Smokeless tobacco: Never Used  Substance Use Topics  . Alcohol use: Yes    Alcohol/week: 0.0 oz  . Drug use: Yes    Frequency: 2.0 times per week    Types: Marijuana     Allergies   Bee venom and Morphine and related   Review of Systems Review of Systems  Musculoskeletal:       Right wrist pain  Skin: Negative for color change.  Neurological: Negative for numbness.     Physical Exam Updated Vital Signs BP (!) 130/91 (BP Location: Left Arm)   Pulse 80   Temp 98 F (36.7 C) (Oral)   Resp 18   Ht 5\' 1"  (1.549 m)   Wt 78.2 kg (172 lb 4.8 oz)   LMP 09/07/2017 (Exact Date)   SpO2 100%   BMI 32.56 kg/m   Physical Exam  Constitutional: She is oriented to person, place, and time. She appears well-developed and well-nourished.  Neck: Normal range of motion.  Pulmonary/Chest: Effort normal.  Musculoskeletal:  Right wrist does not appear swollen. There is no discoloration or deformity. She is generally tender. She is reluctant to bring fingers through range of motion secondary to reported pain.  Neurological: She is alert and oriented to person, place, and time.  Skin: Skin is warm and dry. Capillary refill takes less than 2 seconds.     ED Treatments / Results  Labs (all labs ordered are listed, but only abnormal results are displayed) Labs Reviewed - No data to display  EKG  EKG Interpretation None       Radiology No results found.  Procedures Procedures (including critical care time)  Medications Ordered in ED Medications - No data to display   Initial Impression / Assessment and Plan / ED Course  I have reviewed the triage vital signs and the nursing notes.  Pertinent labs & imaging results that were available during my care of the patient were reviewed by me and considered in my medical decision making (see  chart for details).     Patient here for 3 weeks of right wrist pain and reported swelling. She is reluctant to move wrist or fingers but is observed to scratch her scalp and twist the wrist to wipe her nose without difficulty or wincing. She is felt to have a soft tissue injury that requires supportive care. Splint provided.     Final Clinical Impressions(s) / ED Diagnoses   Final diagnoses:  None   1. Right wrist sprain  ED Discharge Orders    None       Gionna Polak,  Calton Golds 09/08/17 [place], April, MD 09/08/17 [phone]

## 2017-09-08 NOTE — ED Notes (Signed)
Pt called to take to room  No response from lobby

## 2017-11-28 ENCOUNTER — Other Ambulatory Visit: Payer: Self-pay

## 2017-11-28 ENCOUNTER — Emergency Department (HOSPITAL_BASED_OUTPATIENT_CLINIC_OR_DEPARTMENT_OTHER)
Admission: EM | Admit: 2017-11-28 | Discharge: 2017-11-28 | Disposition: A | Discharge status: Home / Self Care | Payer: Medicaid Other | Attending: Emergency Medicine | Admitting: Emergency Medicine

## 2017-11-28 ENCOUNTER — Encounter (HOSPITAL_BASED_OUTPATIENT_CLINIC_OR_DEPARTMENT_OTHER): Payer: Self-pay | Admitting: Emergency Medicine

## 2017-11-28 DIAGNOSIS — Z79899 Other long term (current) drug therapy: Secondary | ICD-10-CM | POA: Insufficient documentation

## 2017-11-28 DIAGNOSIS — B20 Human immunodeficiency virus [HIV] disease: Secondary | ICD-10-CM | POA: Diagnosis not present

## 2017-11-28 DIAGNOSIS — L03011 Cellulitis of right finger: Secondary | ICD-10-CM | POA: Diagnosis not present

## 2017-11-28 DIAGNOSIS — F17210 Nicotine dependence, cigarettes, uncomplicated: Secondary | ICD-10-CM | POA: Diagnosis not present

## 2017-11-28 DIAGNOSIS — M79642 Pain in left hand: Secondary | ICD-10-CM | POA: Diagnosis present

## 2017-11-28 DIAGNOSIS — L089 Local infection of the skin and subcutaneous tissue, unspecified: Secondary | ICD-10-CM

## 2017-11-28 MED ORDER — CEPHALEXIN 250 MG PO CAPS
500.0000 mg | ORAL_CAPSULE | Freq: Once | ORAL | Status: AC
  Administered 2017-11-28: 500 mg via ORAL
  Filled 2017-11-28: qty 2

## 2017-11-28 MED ORDER — CEPHALEXIN 500 MG PO CAPS: 500.0000 mg | ORAL_CAPSULE | Freq: Four times a day (QID) | ORAL | 0 refills | Status: DC

## 2017-11-28 NOTE — ED Provider Notes (Signed)
[facility] EMERGENCY DEPARTMENT Provider Note   CSN: [id number] Arrival date & time: 11/28/17  1831     History   Chief Complaint Chief Complaint  Patient presents with  . Hand Pain    HPI Beverly [name] is a 37 y.o. female.  Patient with history of HIV presents the emergency department with complaint of pain and swelling to her left ring fingertip.  Patient states that she was having her nails done several days ago and felt pain when the tech was filing her nails.  Pain then improved after having of the acrylic nail placed on top.  However since that time she has developed some slight discoloration of the fingertip and has a "toothache" type pain in the finger.  No fevers, nausea or vomiting.  No treatments prior to arrival other than 600 mg of ibuprofen which has not been helping. The onset of this condition was acute. The course is constant. Aggravating factors: palpation. Alleviating factors: none.       Past Medical History:  Diagnosis Date  . Abscess   . AIDS ([place]) 03/19/2015  . Anxiety   . Asthma   . Blood transfusion without reported diagnosis   . Depression   . HIV infection ([place])   . Homeless 03/19/2015  . 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 Leg lesion 03/19/2015  . Major depression, recurrent ([place]) 03/19/2015  . 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 Skin ulcer ([place]) 05/06/2015    Patient Active Problem List   Diagnosis Date Noted  . 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 AIDS (acquired immune deficiency syndrome) ([place])   . Pancytopenia ([place]) 01/09/2016  . Abscess of right buttock 01/09/2016  . AIDS ([place]) 03/19/2015  . Major depression, recurrent ([place]) 03/19/2015  . Homeless 03/19/2015  . 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 Hypokalemia 08/13/2014  . Depression 10/17/2013  . Anemia 10/17/2013  . Thrombocytopenia ([place]) 10/17/2013  . Cigarette smoker 10/17/2013  . Asthma 10/17/2013  . Encounter for general adult medical examination  without abnormal findings 06/12/2011  . Anxiety state 07/17/2008  . Human immunodeficiency virus (HIV) disease ([place]) 06/28/2008    Past Surgical History:  Procedure Laterality Date  . CESAREAN SECTION    . INCISION AND DRAINAGE ABSCESS Right 01/10/2016   Procedure: INCISION AND DRAINAGE RIGHT BUTTOCK, LEFT LABIAL , EXCISION AND DRAINAGE OF  RIGHT AXILLARY ABSCESS;  Surgeon: Excell Seltzer, MD;  Location: [facility];  Service: General;  Laterality: Right;     OB History    Gravida  1   Para      Term      Preterm      AB      Living        SAB      TAB      Ectopic      Multiple      Live Births               Home Medications    Prior to Admission medications   Medication Sig Start Date End Date Taking? Authorizing Provider  bictegravir-emtricitabine-tenofovir AF (BIKTARVY) 50-200-25 MG TABS tablet Take 1 tablet by mouth daily. 11/11/16   Truman Hayward, MD  cephALEXin (KEFLEX) 500 MG capsule Take 1 capsule (500 mg total) by mouth 4 (four) times daily. 11/28/17   Carlisle Cater, PA-C  ibuprofen (ADVIL,MOTRIN) 600 MG tablet Take 1 tablet (600 mg total) by mouth every 6 (six) hours as needed. 09/08/17  Charlann Lange, PA-C  Tetrahydrozoline HCl (VISINE OP) Apply 1-2 drops to eye 4 (four) times daily as needed (dryness.).    [provider]    Family History Family History  Problem Relation Age of Onset  . Throat cancer Mother   . Throat cancer Father   . Hypertension Other   . Cancer Other   . Diabetes Other   . Asthma Other     Social History Social History   Tobacco Use  . Smoking status: Current Every Day Smoker    Packs/day: 0.30    Years: 3.00    Pack years: 0.90    Types: Cigarettes  . Smokeless tobacco: Never Used  Substance Use Topics  . Alcohol use: Yes    Alcohol/week: 0.0 oz  . Drug use: Yes    Frequency: 2.0 times per week    Types: Marijuana     Allergies   Bee venom and Morphine and related   Review of  Systems Review of Systems  Constitutional: Negative for activity change.  Musculoskeletal: Positive for arthralgias and myalgias. Negative for back pain, joint swelling and neck pain.  Skin: Negative for wound.  Neurological: Negative for weakness and numbness.     Physical Exam Updated Vital Signs BP (!) 129/92 (BP Location: Left Arm)   Pulse 71   Temp 98.2 F (36.8 C) (Oral)   Resp 18   Ht 5\' 1"  (1.549 m)   Wt 78 kg (172 lb)   LMP 10/28/2017   SpO2 100%   BMI 32.50 kg/m   Physical Exam  Constitutional: She appears well-developed and well-nourished.  HENT:  Head: Normocephalic and atraumatic.  Eyes: Pupils are equal, round, and reactive to light.  Neck: Normal range of motion. Neck supple.  Cardiovascular: Normal pulses. Exam reveals no decreased pulses.  Musculoskeletal: She exhibits tenderness. She exhibits no edema.       Right hand: She exhibits tenderness. She exhibits normal range of motion.       Hands: Neurological: She is alert. No sensory deficit.  Motor, sensation, and vascular distal to the injury is fully intact.   Skin: Skin is warm and dry.  Psychiatric: She has a normal mood and affect.  Nursing note and vitals reviewed.    ED Treatments / Results  Labs (all labs ordered are listed, but only abnormal results are displayed) Labs Reviewed - No data to display  EKG None  Radiology No results found.  Procedures Procedures (including critical care time)  Medications Ordered in ED Medications  cephALEXin (KEFLEX) capsule 500 mg (has no administration in time range)     Initial Impression / Assessment and Plan / ED Course  I have reviewed the triage vital signs and the nursing notes.  Pertinent labs & imaging results that were available during my care of the patient were reviewed by me and considered in my medical decision making (see chart for details).     Patient seen and examined.  Will start patient on Keflex for possible fingertip  infection.  At this point her exam is not very impressive.  She has mild erythema and tenderness without signs of paronychia.  No indication for drainage at this point.  Patient counseled on elevation, warm soaks/compresses, and need to return if not improved or worsening within the next 48 hours.  Vital signs reviewed and are as follows: BP (!) 129/92 (BP Location: Left Arm)   Pulse 71   Temp 98.2 F (36.8 C) (Oral)   Resp 18  Ht 5\' 1"  (1.549 m)   Wt 78 kg (172 lb)   LMP 10/28/2017   SpO2 100%   BMI 32.50 kg/m     Final Clinical Impressions(s) / ED Diagnoses   Final diagnoses:  Finger infection   Patient with mild fingertip infection after application of new nail.  Symptoms minimal at this point.  There is nothing to drain.  I do not feel that the nail needs to be removed at this point.  Patient will take antibiotics and monitor closely, return if symptoms worsen.  ED Discharge Orders        Ordered    cephALEXin (KEFLEX) 500 MG capsule  4 times daily,   Status:  Discontinued     11/28/17 1909    cephALEXin (KEFLEX) 500 MG capsule  4 times daily     11/28/17 1909       Carlisle Cater, PA-C 11/28/17 1918    Varney Biles, MD 11/28/17 [id number]

## 2017-11-28 NOTE — ED Triage Notes (Signed)
Patient states that she had her nails done a few days ago and she feels like they used the drill down to her skin and she has an open sore under her acrylic nail. The patient states that this am she woke up with throbbing pain and "fever" to her nail

## 2017-11-28 NOTE — Discharge Instructions (Signed)
Please read and follow all provided instructions.  Your diagnoses today include:  1. Finger infection     Tests performed today include:  Vital signs. See below for your results today.   Medications prescribed:   Keflex (cephalexin) - antibiotic  You have been prescribed an antibiotic medicine: take the entire course of medicine even if you are feeling better. Stopping early can cause the antibiotic not to work.  Take any prescribed medications only as directed.   Home care instructions:  Follow any educational materials contained in this packet. Keep affected area above the level of your heart when possible. Wash area gently twice a day with warm soapy water. Do not apply alcohol or hydrogen peroxide. Cover the area if it draining or weeping.   Follow-up instructions: Return to the Emergency Department in 48 hours for a recheck if your symptoms are not significantly improved.   Please follow-up with your primary care provider in the next 1 week for further evaluation of your symptoms.   Return instructions:  Return to the Emergency Department if you have:  Fever  Worsening symptoms  Worsening pain  Worsening swelling  Redness of the skin that moves away from the affected area, especially if it streaks away from the affected area   Any other emergent concerns  Your vital signs today were: BP (!) 129/92 (BP Location: Left Arm)    Pulse 71    Temp 98.2 F (36.8 C) (Oral)    Resp 18    Ht 5\' 1"  (1.549 m)    Wt 78 kg (172 lb)    LMP 10/28/2017    SpO2 100%    BMI 32.50 kg/m  If your blood pressure (BP) was elevated above 135/85 this visit, please have this repeated by your doctor within one month. --------------

## 2017-11-28 NOTE — ED Notes (Signed)
Pt given d/c instructions as per chart. Rx x 1. Verbalizes understanding. No questions. 

## 2017-12-27 ENCOUNTER — Emergency Department (HOSPITAL_BASED_OUTPATIENT_CLINIC_OR_DEPARTMENT_OTHER): Payer: Medicaid Other

## 2017-12-27 ENCOUNTER — Other Ambulatory Visit: Payer: Self-pay

## 2017-12-27 ENCOUNTER — Encounter (HOSPITAL_BASED_OUTPATIENT_CLINIC_OR_DEPARTMENT_OTHER): Payer: Self-pay | Admitting: Emergency Medicine

## 2017-12-27 ENCOUNTER — Emergency Department (HOSPITAL_BASED_OUTPATIENT_CLINIC_OR_DEPARTMENT_OTHER)
Admission: EM | Admit: 2017-12-27 | Discharge: 2017-12-27 | Disposition: A | Discharge status: Home / Self Care | Payer: Medicaid Other | Attending: Emergency Medicine | Admitting: Emergency Medicine

## 2017-12-27 DIAGNOSIS — R6883 Chills (without fever): Secondary | ICD-10-CM | POA: Diagnosis not present

## 2017-12-27 DIAGNOSIS — Z79899 Other long term (current) drug therapy: Secondary | ICD-10-CM | POA: Diagnosis not present

## 2017-12-27 DIAGNOSIS — F17210 Nicotine dependence, cigarettes, uncomplicated: Secondary | ICD-10-CM | POA: Insufficient documentation

## 2017-12-27 DIAGNOSIS — M7918 Myalgia, other site: Secondary | ICD-10-CM | POA: Diagnosis not present

## 2017-12-27 DIAGNOSIS — J45909 Unspecified asthma, uncomplicated: Secondary | ICD-10-CM | POA: Insufficient documentation

## 2017-12-27 DIAGNOSIS — L089 Local infection of the skin and subcutaneous tissue, unspecified: Secondary | ICD-10-CM | POA: Diagnosis not present

## 2017-12-27 DIAGNOSIS — R52 Pain, unspecified: Secondary | ICD-10-CM

## 2017-12-27 DIAGNOSIS — B20 Human immunodeficiency virus [HIV] disease: Secondary | ICD-10-CM | POA: Insufficient documentation

## 2017-12-27 LAB — URINALYSIS, MICROSCOPIC (REFLEX)

## 2017-12-27 LAB — URINALYSIS, ROUTINE W REFLEX MICROSCOPIC
Bilirubin Urine: NEGATIVE
GLUCOSE, UA: NEGATIVE mg/dL
KETONES UR: NEGATIVE mg/dL
NITRITE: NEGATIVE
PH: 6.5 (ref 5.0–8.0)
PROTEIN: NEGATIVE mg/dL
Specific Gravity, Urine: 1.015 (ref 1.005–1.030)

## 2017-12-27 LAB — CBC WITH DIFFERENTIAL/PLATELET
Basophils Absolute: 0 10*3/uL (ref 0.0–0.1)
Basophils Relative: 0 %
Eosinophils Absolute: 0 10*3/uL (ref 0.0–0.7)
Eosinophils Relative: 0 %
HEMATOCRIT: 32.3 % — AB (ref 36.0–46.0)
HEMOGLOBIN: 10.6 g/dL — AB (ref 12.0–15.0)
LYMPHS ABS: 0.6 10*3/uL — AB (ref 0.7–4.0)
LYMPHS PCT: 7 %
MCH: 25.9 pg — AB (ref 26.0–34.0)
MCHC: 32.8 g/dL (ref 30.0–36.0)
MCV: 78.8 fL (ref 78.0–100.0)
MONOS PCT: 5 %
Monocytes Absolute: 0.4 10*3/uL (ref 0.1–1.0)
NEUTROS ABS: 7 10*3/uL (ref 1.7–7.7)
NEUTROS PCT: 88 %
Platelets: 96 10*3/uL — ABNORMAL LOW (ref 150–400)
RBC: 4.1 MIL/uL (ref 3.87–5.11)
RDW: 18.7 % — ABNORMAL HIGH (ref 11.5–15.5)
WBC: 7.9 10*3/uL (ref 4.0–10.5)

## 2017-12-27 LAB — PREGNANCY, URINE: Preg Test, Ur: NEGATIVE

## 2017-12-27 LAB — COMPREHENSIVE METABOLIC PANEL
ALK PHOS: 86 U/L (ref 38–126)
ALT: 16 U/L (ref 0–44)
AST: 29 U/L (ref 15–41)
Albumin: 2.8 g/dL — ABNORMAL LOW (ref 3.5–5.0)
Anion gap: 7 (ref 5–15)
BILIRUBIN TOTAL: 0.8 mg/dL (ref 0.3–1.2)
BUN: 11 mg/dL (ref 6–20)
CALCIUM: 7.3 mg/dL — AB (ref 8.9–10.3)
CO2: 21 mmol/L — ABNORMAL LOW (ref 22–32)
CREATININE: 0.6 mg/dL (ref 0.44–1.00)
Chloride: 107 mmol/L (ref 98–111)
GFR calc Af Amer: >60 mL/min (ref >60)
Glucose, Bld: 94 mg/dL (ref 70–99)
Potassium: 3.5 mmol/L (ref 3.5–5.1)
Sodium: 135 mmol/L (ref 135–145)
TOTAL PROTEIN: 7.6 g/dL (ref 6.5–8.1)

## 2017-12-27 LAB — RAPID URINE DRUG SCREEN, HOSP PERFORMED
AMPHETAMINES: POSITIVE — AB
Benzodiazepines: NOT DETECTED
Cocaine: NOT DETECTED
OPIATES: NOT DETECTED
Tetrahydrocannabinol: POSITIVE — AB

## 2017-12-27 LAB — I-STAT CG4 LACTIC ACID, ED: Lactic Acid, Venous: 0.78 mmol/L (ref 0.5–1.9)

## 2017-12-27 LAB — LIPASE, BLOOD: LIPASE: 22 U/L (ref 11–51)

## 2017-12-27 MED ORDER — ACETAMINOPHEN 500 MG PO TABS
1000.0000 mg | ORAL_TABLET | Freq: Once | ORAL | Status: AC
  Administered 2017-12-27: 1000 mg via ORAL
  Filled 2017-12-27: qty 2

## 2017-12-27 MED ORDER — KETOROLAC TROMETHAMINE 30 MG/ML IJ SOLN
30.0000 mg | Freq: Once | INTRAMUSCULAR | Status: AC
  Administered 2017-12-27: 30 mg via INTRAVENOUS
  Filled 2017-12-27: qty 1

## 2017-12-27 MED ORDER — CEPHALEXIN 250 MG PO CAPS
500.0000 mg | ORAL_CAPSULE | Freq: Once | ORAL | Status: AC
  Administered 2017-12-27: 500 mg via ORAL
  Filled 2017-12-27: qty 2

## 2017-12-27 MED ORDER — CALCIUM CARBONATE ANTACID 500 MG PO CHEW
3.0000 | CHEWABLE_TABLET | Freq: Once | ORAL | Status: AC
Start: 2017-12-27 — End: 2017-12-27
  Administered 2017-12-27: 600 mg via ORAL
  Filled 2017-12-27: qty 3

## 2017-12-27 MED ORDER — SODIUM CHLORIDE 0.9 % IV BOLUS
1000.0000 mL | Freq: Once | INTRAVENOUS | Status: AC
  Administered 2017-12-27: 1000 mL via INTRAVENOUS

## 2017-12-27 MED ORDER — CEPHALEXIN 500 MG PO CAPS: 500.0000 mg | ORAL_CAPSULE | Freq: Two times a day (BID) | ORAL | 0 refills | Status: DC

## 2017-12-27 MED ORDER — SULFAMETHOXAZOLE-TRIMETHOPRIM 800-160 MG PO TABS
1.0000 | ORAL_TABLET | Freq: Once | ORAL | Status: AC
  Administered 2017-12-27: 1 via ORAL
  Filled 2017-12-27: qty 1

## 2017-12-27 MED ORDER — ONDANSETRON HCL 4 MG/2ML IJ SOLN
4.0000 mg | Freq: Once | INTRAMUSCULAR | Status: AC
  Administered 2017-12-27: 4 mg via INTRAVENOUS
  Filled 2017-12-27: qty 2

## 2017-12-27 MED ORDER — SULFAMETHOXAZOLE-TRIMETHOPRIM 800-160 MG PO TABS: 1.0000 | ORAL_TABLET | Freq: Two times a day (BID) | ORAL | 0 refills | Status: AC

## 2017-12-27 NOTE — Discharge Instructions (Signed)
Call the Infectious Diseases clinic to schedule an appointment. Take antibiotics as prescribed. Drink plenty of fluids and take tylenol or ibuprofen for pain. Return to ER for high fever, breathing problems, abdominal pain, or vomiting.

## 2017-12-27 NOTE — ED Triage Notes (Signed)
Patient from work.  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  Reports she became "hot, nauseated and had chills".

## 2017-12-27 NOTE — ED Provider Notes (Signed)
[facility] EMERGENCY DEPARTMENT Provider Note   CSN: [id number] Arrival date & time: 12/27/17  1446     History   Chief Complaint Chief Complaint  Patient presents with  . Generalized Body Aches    HPI Beverly [name] is a 37 y.o. female.  37yo F w/ PMH including HIV, anxiety/depression, Kaposi's sarcoma who p/w body aches and chills. 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 She had 2 episodes of vomiting, has had mild diarrhea today. She states she began hyperventilating which gave her some chest tightness but this passed. She continues to have severe diffuse body pain, no focal area of pain. Mild runny nose recently, no cough, sore throat, fevers, sick contacts, urinary symptoms, or vaginal bleeding/discharge. She has an area on her L groin which was previously I&D'd that has gotten sore and is draining again. She reports she was in her usual state of health this morning. She states she is compliant with meds.  The history is provided by the patient.    Past Medical History:  Diagnosis Date  . Abscess   . AIDS ([place]) 03/19/2015  . Anxiety   . Asthma   . Blood transfusion without reported diagnosis   . Depression   . HIV infection ([place])   . Homeless 03/19/2015  . 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 Leg lesion 03/19/2015  . Major depression, recurrent ([place]) 03/19/2015  . 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 Skin ulcer ([place]) 05/06/2015    Patient Active Problem List   Diagnosis Date Noted  . 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 AIDS (acquired immune deficiency syndrome) ([place])   . Pancytopenia ([place]) 01/09/2016  . Abscess of right buttock 01/09/2016  . AIDS ([place]) 03/19/2015  . Major depression, recurrent ([place]) 03/19/2015  . Homeless 03/19/2015  . 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 Hypokalemia 08/13/2014  . Depression 10/17/2013  . Anemia  10/17/2013  . Thrombocytopenia ([place]) 10/17/2013  . Cigarette smoker 10/17/2013  . Asthma 10/17/2013  . Encounter for general adult medical examination without abnormal findings 06/12/2011  . Anxiety state 07/17/2008  . Human immunodeficiency virus (HIV) disease ([place]) 06/28/2008    Past Surgical History:  Procedure Laterality Date  . CESAREAN SECTION    . INCISION AND DRAINAGE ABSCESS Right 01/10/2016   Procedure: INCISION AND DRAINAGE RIGHT BUTTOCK, LEFT LABIAL , EXCISION AND DRAINAGE OF  RIGHT AXILLARY ABSCESS;  Surgeon: Excell Seltzer, MD;  Location: [facility];  Service: General;  Laterality: Right;     OB History    Gravida  1   Para      Term      Preterm      AB      Living        SAB      TAB      Ectopic      Multiple      Live Births               Home Medications    Prior to Admission medications   Medication Sig Start Date End Date Taking? Authorizing Provider  bictegravir-emtricitabine-tenofovir AF (BIKTARVY) 50-200-25 MG TABS tablet Take 1 tablet by mouth daily. 11/11/16   Truman Hayward, MD  cephALEXin (KEFLEX) 500 MG capsule Take 1 capsule (500 mg total) by mouth 2 (two) times daily. 12/27/17   Elivia Robotham,  Wenda Overland, MD  ibuprofen (ADVIL,MOTRIN) 600 MG tablet Take 1 tablet (600 mg total) by mouth every 6 (six) hours as needed. 09/08/17   Charlann Lange, PA-C  sulfamethoxazole-trimethoprim (BACTRIM DS,SEPTRA DS) 800-160 MG tablet Take 1 tablet by mouth 2 (two) times daily for 7 days. 12/27/17 01/03/18  Ulah Olmo, Wenda Overland, MD  Tetrahydrozoline HCl (VISINE OP) Apply 1-2 drops to eye 4 (four) times daily as needed (dryness.).    [provider]    Family History Family History  Problem Relation Age of Onset  . Throat cancer Mother   . Throat cancer Father   . Hypertension Other   . Cancer Other   . Diabetes Other   . Asthma Other     Social History Social History   Tobacco Use  . Smoking status: Current Every Day Smoker     Packs/day: 0.30    Years: 3.00    Pack years: 0.90    Types: Cigarettes  . Smokeless tobacco: Never Used  Substance Use Topics  . Alcohol use: Yes    Alcohol/week: 0.0 oz  . Drug use: Yes    Frequency: 2.0 times per week    Types: Marijuana     Allergies   Bee venom and Morphine and related   Review of Systems Review of Systems All other systems reviewed and are negative except that which was mentioned in HPI  Physical Exam Updated Vital Signs BP 110/74   Pulse 68   Temp 98.9 F (37.2 C) (Oral)   Resp (!) 21   Ht 5\' 1"  (1.549 m)   Wt 78 kg (172 lb)   LMP 12/05/2017 (Approximate)   SpO2 96%   BMI 32.50 kg/m   Physical Exam  Constitutional: She is oriented to person, place, and time. She appears well-developed and well-nourished. She appears distressed.  Moaning, shaking, in distress  HENT:  Head: Normocephalic and atraumatic.  Moist mucous membranes  Eyes: Pupils are equal, round, and reactive to light. Conjunctivae are normal.  Neck: Neck supple.  Cardiovascular: Normal rate, regular rhythm and normal heart sounds.  No murmur heard. Pulmonary/Chest: Effort normal and breath sounds normal.  Abdominal: Soft. Bowel sounds are normal. She exhibits no distension. There is no tenderness.  Genitourinary:  Genitourinary Comments: Scar tissue and induration on L inguinal canal with tenderness, no obvious fluctuance  Musculoskeletal: She exhibits no edema.  Neurological: She is alert and oriented to person, place, and time.  Fluent speech  Skin: Skin is warm and dry.  Scattered scars on b/l lower legs w/ 1 small area of induration on R lateral lower leg  Psychiatric: She has a normal mood and affect. Judgment normal.  Nursing note and vitals reviewed.    ED Treatments / Results  Labs (all labs ordered are listed, but only abnormal results are displayed) Labs Reviewed  COMPREHENSIVE METABOLIC PANEL - Abnormal; Notable for the following components:      Result  Value   CO2 21 (*)    Calcium 7.3 (*)    Albumin 2.8 (*)    All other components within normal limits  RAPID URINE DRUG SCREEN, HOSP PERFORMED - Abnormal; Notable for the following components:   Amphetamines POSITIVE (*)    Tetrahydrocannabinol POSITIVE (*)    Barbiturates   (*)    Value: Result not available. Reagent lot number recalled by manufacturer.   All other components within normal limits  CBC WITH DIFFERENTIAL/PLATELET - Abnormal; Notable for the following components:   Hemoglobin 10.6 (*)  HCT 32.3 (*)    MCH 25.9 (*)    RDW 18.7 (*)    Platelets 96 (*)    Lymphs Abs 0.6 (*)    All other components within normal limits  URINALYSIS, ROUTINE W REFLEX MICROSCOPIC - Abnormal; Notable for the following components:   Hgb urine dipstick TRACE (*)    Leukocytes, UA TRACE (*)    All other components within normal limits  URINALYSIS, MICROSCOPIC (REFLEX) - Abnormal; Notable for the following components:   Bacteria, UA RARE (*)    All other components within normal limits  URINE CULTURE  PREGNANCY, URINE  LIPASE, BLOOD  I-STAT CG4 LACTIC ACID, ED  I-STAT CG4 LACTIC ACID, ED    EKG EKG Interpretation  Date/Time:  Monday December 27 2017 15:27:16 EDT Ventricular Rate:  96 PR Interval:    QRS Duration: 96 QT Interval:  326 QTC Calculation: 412 R Axis:   130 Text Interpretation:  Sinus rhythm Probable left atrial enlargement Right axis deviation Nonspecific T abnormalities, diffuse leads similar to previous Confirmed by Theotis Burrow [phone]) on 12/27/2017 8:17:45 PM   Radiology Dg Chest 2 View  Result Date: 12/27/2017 CLINICAL DATA:  Chills.  History of HIV disease EXAM: CHEST - 2 VIEW COMPARISON:  May 02, 2015 FINDINGS: There is no edema or consolidation. The heart size and pulmonary vascularity within normal limits. No adenopathy. No bone lesions. IMPRESSION: No edema or consolidation.  No evident adenopathy. Electronically Signed   By: Lowella Grip III M.D.   On:  12/27/2017 15:48    Procedures Procedures (including critical care time)  Medications Ordered in ED Medications  calcium carbonate (TUMS - dosed in mg elemental calcium) chewable tablet 600 mg of elemental calcium (has no administration in time range)  cephALEXin (KEFLEX) capsule 500 mg (has no administration in time range)  sulfamethoxazole-trimethoprim (BACTRIM DS,SEPTRA DS) 800-160 MG per tablet 1 tablet (has no administration in time range)  sodium chloride 0.9 % bolus 1,000 mL (0 mLs Intravenous Stopped 12/27/17 1646)  ondansetron (ZOFRAN) injection 4 mg (4 mg Intravenous Given 12/27/17 1615)  acetaminophen (TYLENOL) tablet 1,000 mg (1,000 mg Oral Given 12/27/17 1615)  ketorolac (TORADOL) 30 MG/ML injection 30 mg (30 mg Intravenous Given 12/27/17 2029)     Initial Impression / Assessment and Plan / ED Course  I have reviewed the triage vital signs and the nursing notes.  Pertinent labs & imaging results that were available during my care of the patient were reviewed by me and considered in my medical decision making (see chart for details).     VS normal on arrival, pt was shaking on exam. L inguinal canal resembles hydradenitis.  I do not appreciate any specific fluid collection suggestive of abscess; will start antibiotics to cover MRSA.   She states she is taking all her medications, although chart review shows no recent ID office visits and previous problems contacting patient to call in meds/check up on her.   Lab work shows normal lactate, CMP notable for calcium 7.3, albumin 2.8, normal LFTs, WBC 7.9, hemoglobin 10.6 similar to previous, platelets 96.  She does have history of thrombocytopenia.  Gave oral calcium repletion as this may be causing some muscle cramps.  Also gave IV fluids and Tylenol, later Toradol.  UA without obvious signs of infection.  Chest x-ray clear.  Her vital signs have remained normal here.  I see no signs or symptoms to suggest life-threatening bacterial  infection or other life-threatening process.  I have discussed the  possibility that she is coming down with a viral illness.  Discussed supportive measures including good hydration and rest at home.  I emphasized the importance of following up with her infectious disease clinic for reassessment and recheck of her lab work, discussed lab findings here.  Provided with Bactrim and Keflex for skin infections.  Extensively reviewed return precautions and she voiced understanding. Final Clinical Impressions(s) / ED Diagnoses   Final diagnoses:  Body aches  Chills (without fever)  Skin infection    ED Discharge Orders        Ordered    cephALEXin (KEFLEX) 500 MG capsule  2 times daily     12/27/17 2036    sulfamethoxazole-trimethoprim (BACTRIM DS,SEPTRA DS) 800-160 MG tablet  2 times daily     12/27/17 2036       Camelle Henkels, Wenda Overland, MD 12/27/17 2042

## 2017-12-29 LAB — URINE CULTURE: Culture: 80000 — AB

## 2017-12-30 ENCOUNTER — Telehealth: Payer: Self-pay | Admitting: *Deleted

## 2017-12-30 NOTE — Telephone Encounter (Signed)
Post ED Visit - Positive Culture Follow-up  Culture report reviewed by antimicrobial stewardship pharmacist:  []  Elenor Quinones, Pharm.D. []  Heide Guile, Pharm.D., BCPS AQ-ID []  Parks Neptune, Pharm.D., BCPS []  Alycia Rossetti, Pharm.D., BCPS []  [place], [place].D., BCPS, AAHIVP []  Legrand Como, Pharm.D., BCPS, AAHIVP [x]  Salome Arnt, PharmD, BCPS []  Johnnette Gourd, PharmD, BCPS []  Hughes Better, PharmD, BCPS []  Leeroy Cha, PharmD  Positive urine culture Treated with Cephalexin, organism sensitive to the same and no further patient follow-up is required at this time.  Harlon Flor [facility] 12/30/2017, 9:45 AM

## 2018-01-04 IMAGING — CT CT ABD-PELV W/ CM
2 of 7 series · 14 of 46 positions shown, 18 images · IV contrast (ISOVUE)
Comparison: None.

CLINICAL DATA: Right buttocks abscess.

EXAM:
CT ABDOMEN AND PELVIS WITH CONTRAST
TECHNIQUE: Multidetector CT imaging of the abdomen and pelvis was performed
using the standard protocol following bolus administration of
intravenous contrast.
CONTRAST:  100mL XJFVTY-1GG IOPAMIDOL (XJFVTY-1GG) INJECTION 61%

[Series 2: abd/pel with · axial · 0.74mm/px · z∈[+703,+1123]mm · 11 of 97 slices shown, 15 images]
[im 7/97  soft-tissue]
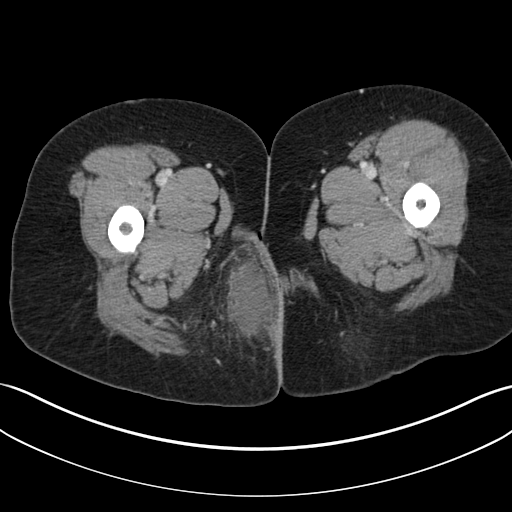
[im 7/97  bone]
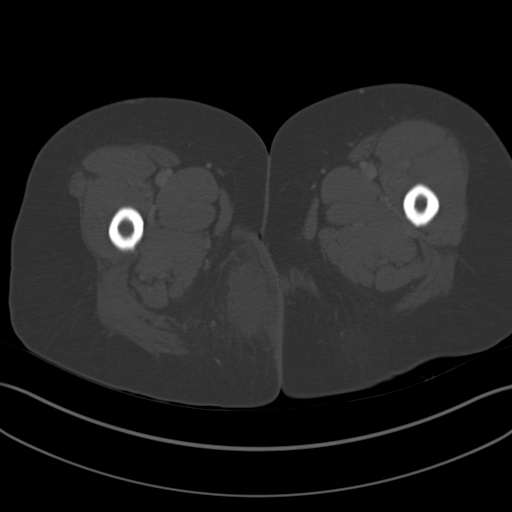
[im 19/97  soft-tissue]
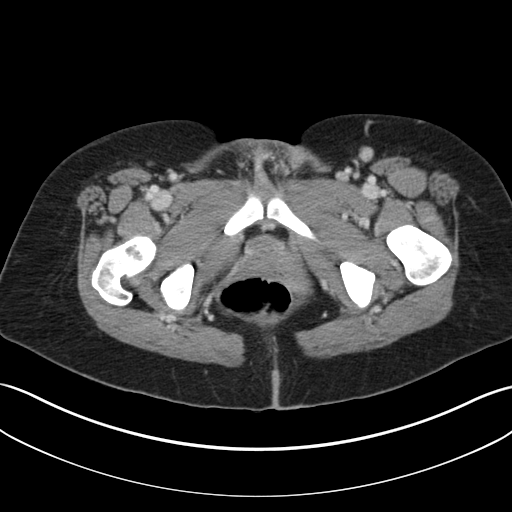
[im 31/97  soft-tissue]
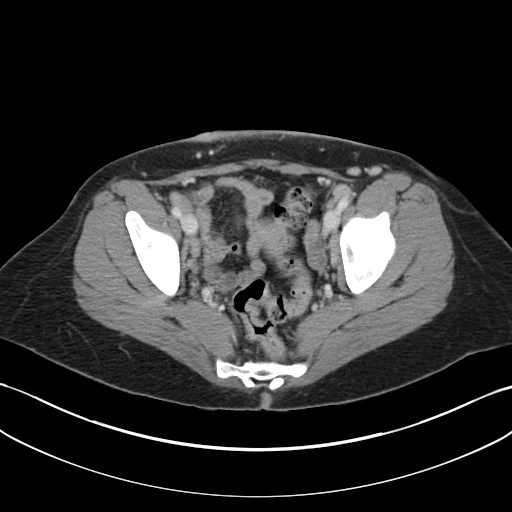
[im 37/97  soft-tissue]
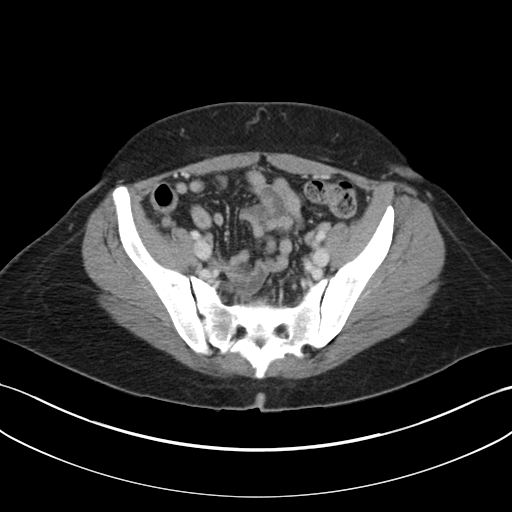
[im 49/97  soft-tissue]
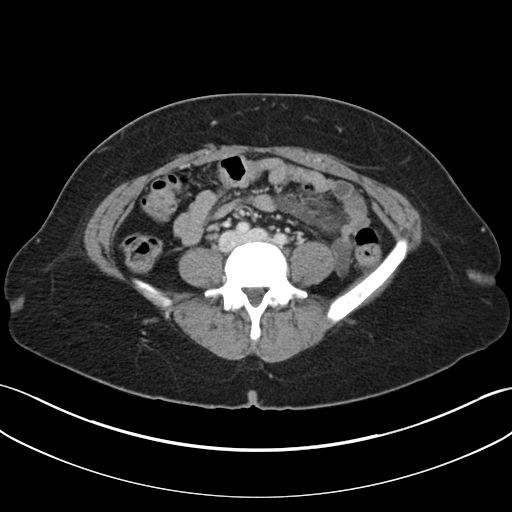
[im 61/97  soft-tissue]
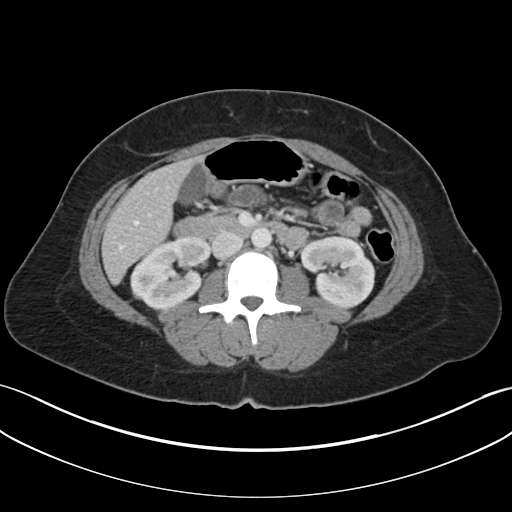
[im 67/97  soft-tissue]
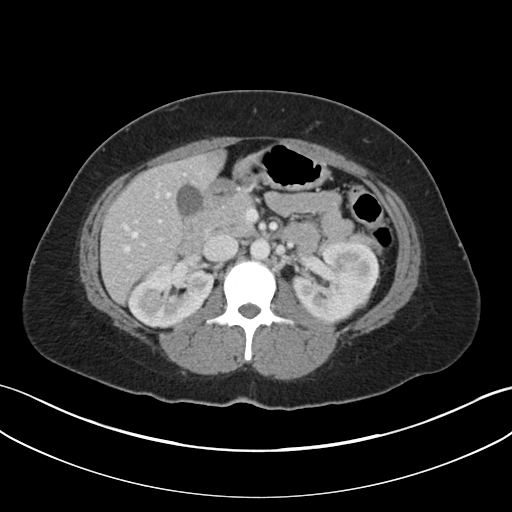
[im 73/97  lung]
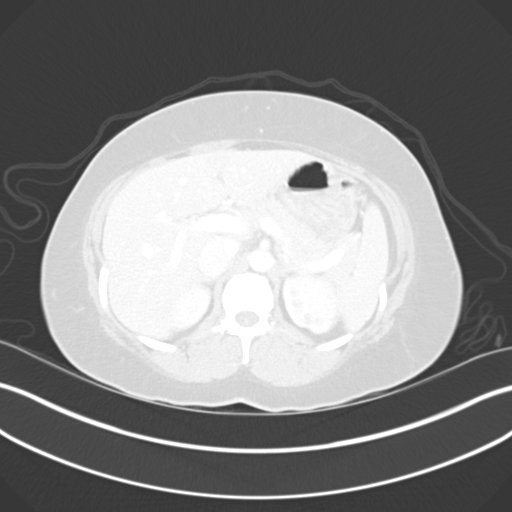
[im 79/97  soft-tissue]
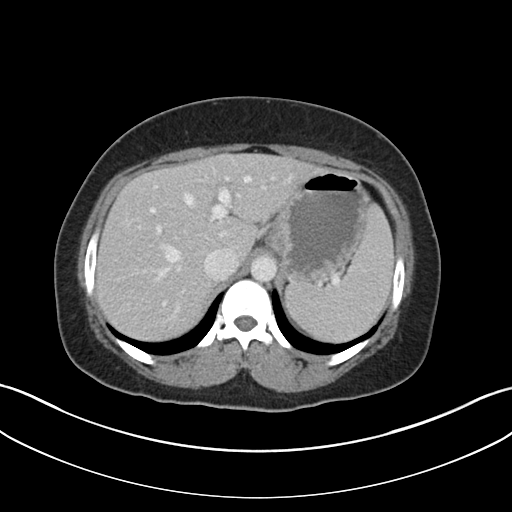
[im 79/97  lung]
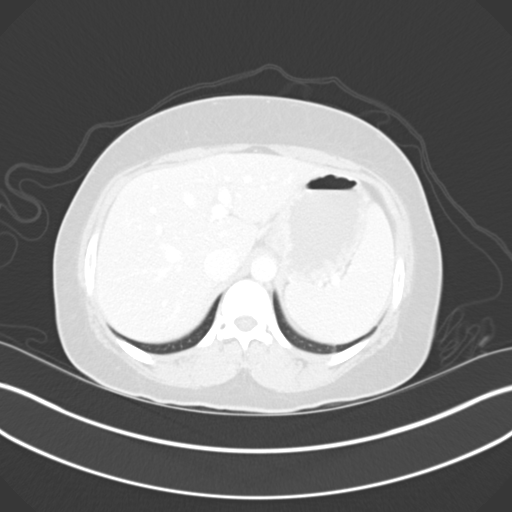
[im 85/97  lung]
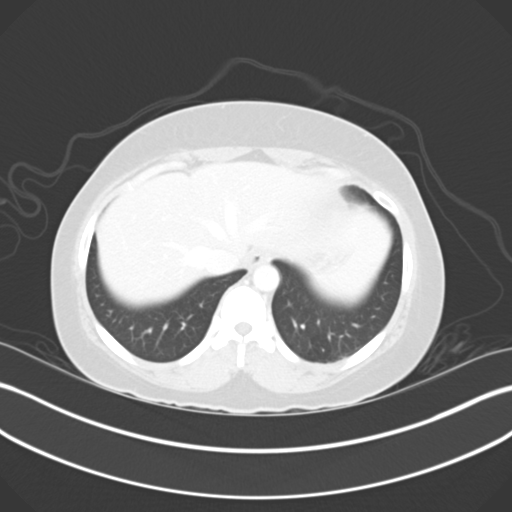
[im 91/97  soft-tissue]
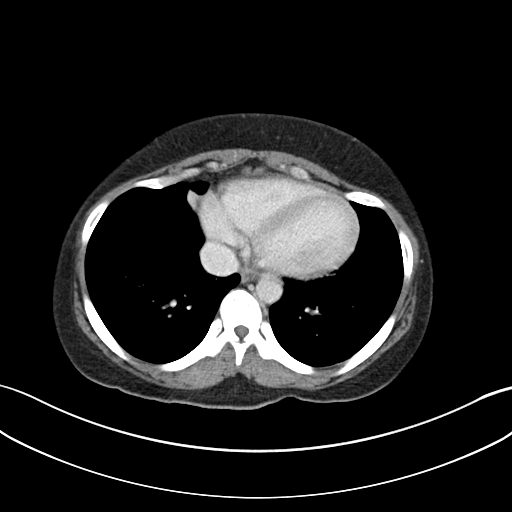
[im 91/97  lung]
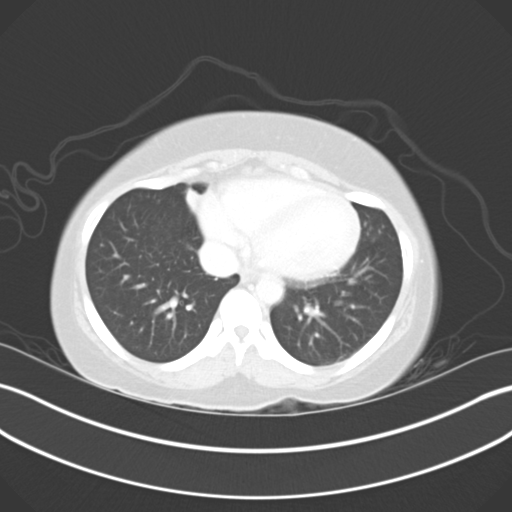
[im 91/97  bone]
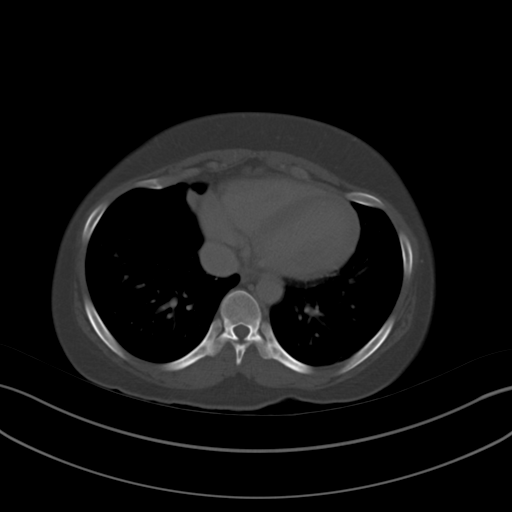

[Series 7: coronal a/|p · coronal · 0.74mm/px · 3 of 115 slices shown]
[im 29/115  soft-tissue]
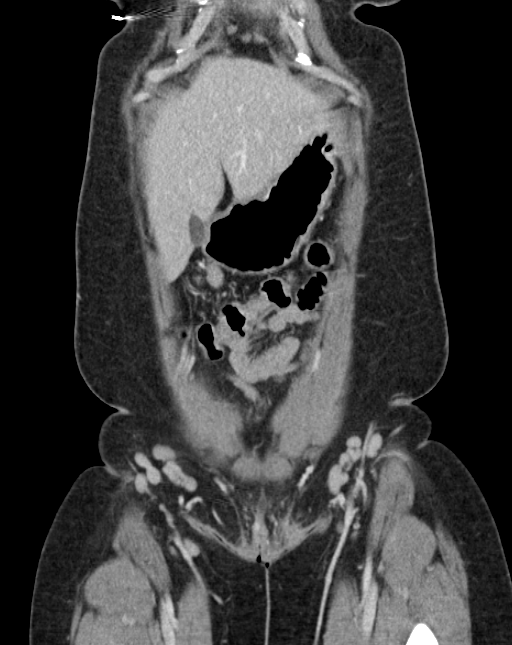
[im 58/115  soft-tissue]
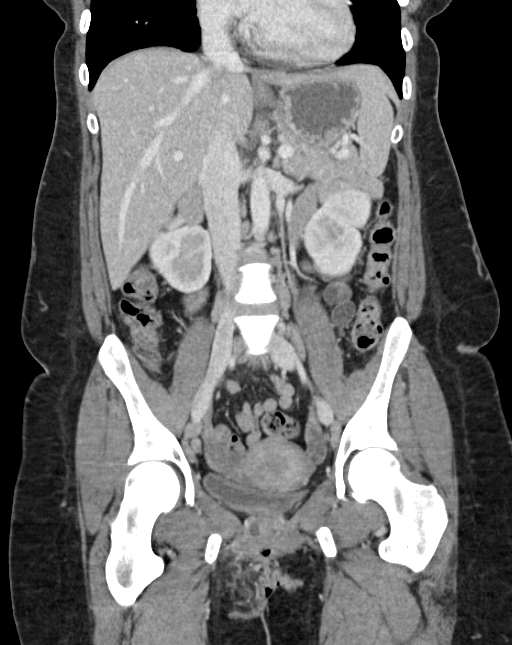
[im 86/115  soft-tissue]
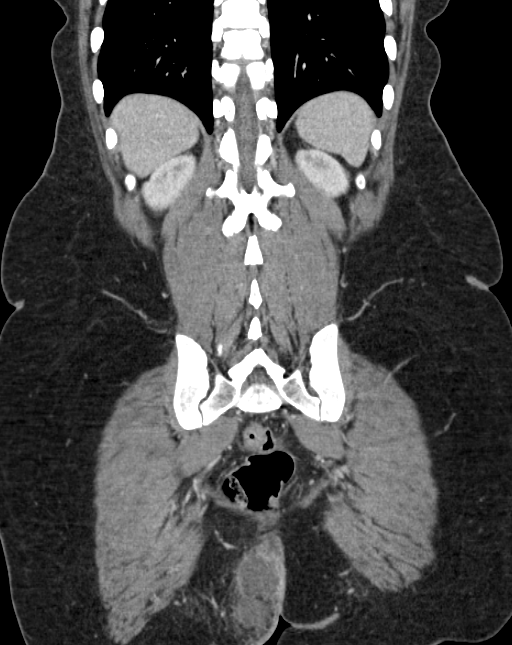

[14 of 46 positions shown; findings below may reference images not displayed]

FINDINGS: Lower chest and abdominal wall: Ovoid rim enhancing fluid collection
in the left labia measuring 15 by 6 mm. There is surrounding
cellulitis. 60 x 25 mm fluid collection in the right buttocks
distorting the gluteal cleft. Rim enhancement is less well-defined
but this still to be liquifaction consistent with at least partial
abscess. Neighboring cellulitis. There is focal cellulitic change
over the right lower abdominal wall. No soft tissue emphysema.

Hepatobiliary: No focal liver abnormality.No evidence of biliary
obstruction or stone.

Pancreas: Unremarkable.

Spleen: Unremarkable.

Adrenals/Urinary Tract: Negative adrenals. Small nonobstructing
right renal calculi, at least 3, measuring up to 4 mm. At least 2
punctate left renal calculi. No hydronephrosis or ureteral calculus
. There is a crescentic shaped fluid collection right and ventral to
the lower urethra, measuring up to 13 by 6 mm.

Stomach/Bowel:  No obstruction. No appendicitis.

Reproductive:Negative uterus and ovaries.

Vascular/Lymphatic: No acute vascular abnormality. Bilateral
inguinal and external chain iliac adenopathy, presumably reactive
given the pelvic findings.

Other: No ascites or pneumoperitoneum.

Musculoskeletal: No acute abnormalities.
IMPRESSION: 1. 6 x 3 cm right buttocks abscess and phlegmon.No soft tissue
emphysema or supralevator involvement.
2. 15 x 6 mm left labia abscess.
3. Urethral diverticulum.
4. Bilateral nephrolithiasis.
5. Inguinal and iliac adenopathy presumably reactive to #1 and 2.
Recommend clinical follow-up.

## 2018-03-03 ENCOUNTER — Telehealth: Payer: Self-pay

## 2018-03-03 NOTE — Telephone Encounter (Signed)
Great news! Thank you for your good work with getting our women back in here for HIV care

## 2018-03-03 NOTE — Telephone Encounter (Signed)
Patients name came up on overdue pap smear list. Called patient to schedule a pap appointment with our office. Patient was able to take my call and would like to return to care with [place]. Patient states she has been going through some personal things and feels like she is ready to come back into care. Patient is scheduled to see Janene Madeira, Np on 03/10/18 for a follow-up appointment and pap smear. [place]

## 2018-03-04 NOTE — Telephone Encounter (Signed)
Excellent

## 2018-03-10 ENCOUNTER — Ambulatory Visit (INDEPENDENT_AMBULATORY_CARE_PROVIDER_SITE_OTHER): Payer: Medicaid Other | Admitting: Licensed Clinical Social Worker

## 2018-03-10 ENCOUNTER — Other Ambulatory Visit (HOSPITAL_COMMUNITY)
Admission: RE | Admit: 2018-03-10 | Discharge: 2018-03-10 | Disposition: A | Discharge status: Home / Self Care | Payer: Medicaid Other | Source: Ambulatory Visit | Attending: Infectious Diseases | Admitting: Infectious Diseases

## 2018-03-10 ENCOUNTER — Encounter: Payer: Self-pay | Admitting: Infectious Diseases

## 2018-03-10 ENCOUNTER — Ambulatory Visit (INDEPENDENT_AMBULATORY_CARE_PROVIDER_SITE_OTHER): Payer: Medicaid Other | Admitting: Infectious Diseases

## 2018-03-10 VITALS — Ht 61.0 in | Wt 159.0 lb

## 2018-03-10 DIAGNOSIS — F332 Major depressive disorder, recurrent severe without psychotic features: Secondary | ICD-10-CM

## 2018-03-10 DIAGNOSIS — R87612 Low grade squamous intraepithelial lesion on cytologic smear of cervix (LGSIL): Secondary | ICD-10-CM

## 2018-03-10 DIAGNOSIS — Z5900 Homelessness unspecified: Secondary | ICD-10-CM

## 2018-03-10 DIAGNOSIS — Z124 Encounter for screening for malignant neoplasm of cervix: Secondary | ICD-10-CM | POA: Insufficient documentation

## 2018-03-10 DIAGNOSIS — Z23 Encounter for immunization: Secondary | ICD-10-CM

## 2018-03-10 DIAGNOSIS — C539 Malignant neoplasm of cervix uteri, unspecified: Secondary | ICD-10-CM | POA: Diagnosis not present

## 2018-03-10 DIAGNOSIS — B20 Human immunodeficiency virus [HIV] disease: Secondary | ICD-10-CM | POA: Diagnosis not present

## 2018-03-10 DIAGNOSIS — G63 Polyneuropathy in diseases classified elsewhere: Secondary | ICD-10-CM

## 2018-03-10 DIAGNOSIS — F331 Major depressive disorder, recurrent, moderate: Secondary | ICD-10-CM | POA: Diagnosis not present

## 2018-03-10 DIAGNOSIS — L299 Pruritus, unspecified: Secondary | ICD-10-CM | POA: Insufficient documentation

## 2018-03-10 DIAGNOSIS — N766 Ulceration of vulva: Secondary | ICD-10-CM

## 2018-03-10 DIAGNOSIS — L98499 Non-pressure chronic ulcer of skin of other sites with unspecified severity: Secondary | ICD-10-CM | POA: Insufficient documentation

## 2018-03-10 MED ORDER — SULFAMETHOXAZOLE-TRIMETHOPRIM 800-160 MG PO TABS: 1.0000 | ORAL_TABLET | Freq: Every day | ORAL | 5 refills | Status: DC

## 2018-03-10 MED ORDER — MUPIROCIN 2 % EX OINT: 1.0000 "application " | TOPICAL_OINTMENT | Freq: Two times a day (BID) | CUTANEOUS | 2 refills | Status: DC

## 2018-03-10 MED ORDER — HYDROXYZINE HCL 25 MG PO TABS: 25.0000 mg | ORAL_TABLET | Freq: Three times a day (TID) | ORAL | 0 refills | Status: DC | PRN

## 2018-03-10 MED ORDER — BICTEGRAVIR-EMTRICITAB-TENOFOV 50-200-25 MG PO TABS: 1.0000 | ORAL_TABLET | Freq: Every day | ORAL | 5 refills | Status: DC

## 2018-03-10 MED ORDER — PREGABALIN 25 MG PO CAPS: 25.0000 mg | ORAL_CAPSULE | Freq: Two times a day (BID) | ORAL | 1 refills | Status: DC

## 2018-03-10 NOTE — Assessment & Plan Note (Addendum)
Previously with LSIL/CIN-1 in 2011. Has not had a pap smear since. She has a lesion on her cervix that I worry may be STI related vs dysplasia. Will see what her pap cytology reveals and refer to GYN accordingly. She has high risk to progress to HPV related cancer with years of untreated HIV. Discussed concern over finding today.

## 2018-03-10 NOTE — Assessment & Plan Note (Signed)
Large full thickness non-healing ulceration from what she reports was previous I&D. Does not presently look infected. Will need good wound care and resuming her ART to help heal this. Discussed link between these non-healing sores and untreated HIV/AIDS.

## 2018-03-10 NOTE — Assessment & Plan Note (Signed)
Will try hydroxyzine prn.

## 2018-03-10 NOTE — BH Specialist Note (Signed)
Integrated Behavioral Health Initial Visit  MRN: [id number] Name: Beverly [name]  Number of [facility] Clinician visits:: 1/6 Session Start time: 11:14am  Session End time: 11:31am Total time: 15 minutes  Type of Service: [place] Interpretor:No. Interpretor Name and Language: n/a   Warm Hand Off Completed.       SUBJECTIVE: Beverly [name] is a 37 y.o. female accompanied by self Patient was referred by Harvin Hazel for depression. Patient reports the following symptoms/concerns: high levels of stress, desire to isolate, mood instability, no energy or motivation, apathy, no desire for self-care, trouble concentrating Duration of problem: several months; Severity of problem: moderate  OBJECTIVE: Mood: Depressed and Affect: Depressed Risk of harm to self or others: No plan to harm self or others  LIFE CONTEXT: Patient reports she has very little stability in her life, not working enough hours to have a place to live and is staying instead with various friends. She does not see much of her (4) children or grandchildren, and only recently told her 9 oldest children about her HIV status. Patient reports that her 3rd child is struggling with her diagnosis because he lost his father in 2009 and the fact that his mother has a chronic disease is too much for him. She indicates that she has little support in her life.   GOALS ADDRESSED: Patient will: 1. Reduce symptoms of: depression  INTERVENTIONS: Interventions utilized: Motivational Interviewing and Supportive Counseling   ASSESSMENT: Patient currently experiencing depressed mood, mood instability, flat affect, decreased time with children, lack of energy, lack of motivation, apathy about health and self care. The most appropriate diagnosis for her symptoms at this time is Major Depressive Disorder, Recurrent, Moderate. Counselor guided patient to identify what she wants to  see change for herself. Patient has a list of things she wants to see happen, beginning with getting a better job and being able to afford her own place. Counselor and patient explored ways to move toward some of these goals. Counselor commended patient for her motivation. Patient shared her thoughts about taking her medication and indicated that doing so reminds her of her diagnosis and makes her feel worthless. Counselor and patient processed how diagnosis impacts patient's worth as a person. Patient identified that she often feels like giving up. Counselor and patient explored activities and tasks that "feed" patient and provide her with the energy to keep going.    Patient may benefit from ongoing CBT.  PLAN: Patient will call back to schedule follow up for counseling.   Lillie Fragmin, LCSW

## 2018-03-10 NOTE — Assessment & Plan Note (Signed)
She is very tearful today and feels in general very hopeless. She contracted for safety today. Feels that she has no support. Has not been on any depression medication consistently in the past. She is willing to meet with Rollene Fare today.

## 2018-03-10 NOTE — Assessment & Plan Note (Signed)
Will try lyrica QD x 5-7d then increase to BID for her today as she has tried gabapentin in the past. Discussed the link with long-standing untreated HIV and nerve damage.

## 2018-03-10 NOTE — Assessment & Plan Note (Signed)
Met with Beverly [name] with [facility] today to plug into case management. Currently staying with a friend.

## 2018-03-10 NOTE — Assessment & Plan Note (Addendum)
She is here today to try to get re-established today for care of her HIV/AIDS. She has had many attempts previously; one barrier is taking daily medications as it is a reminder of her 'status.' Will have her meet with Maudie Mercury today for eligibility/enrollment into trial for long acting injectable medication. Will resume her Biktarvy and once daily Bactrim today. Will add azithromycin for MAC prophylaxis at upcoming visit as I anticipate a prolonged time before her CD4 will increase. Reminded her to use condoms if she is sexually active (provided today). She will return in 1 month to see me again and reassess adherence/VL. She will come back this week for PE and lab work for research.

## 2018-03-10 NOTE — Progress Notes (Signed)
Name: Beverly [name]  DOB: 1981/06/08 MRN: [id number] PCP: Patient, No Pcp Per   Patient Active Problem List   Diagnosis Date Noted  . Ulceration, vulva 03/10/2018  . Itching 03/10/2018  . 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 Pancytopenia ([place]) 01/09/2016  . Major depression, recurrent ([place]) 03/19/2015  . Homeless 03/19/2015  . 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 Depression 10/17/2013  . Anemia 10/17/2013  . Thrombocytopenia ([place]) 10/17/2013  . Cigarette smoker 10/17/2013  . Asthma 10/17/2013  . Encounter for general adult medical examination without abnormal findings 06/12/2011  . Abnormal Pap smear of cervix 01/17/2010  . Anxiety state 07/17/2008  . AIDS (acquired immune deficiency syndrome) ([place]) 06/28/2008     Brief Narrative:  Beverly [name] is a 37 y.o. woman with HIV and AIDS. CD4 nadir < 10 and has been < or = to 50 since 2014 and likely beyond that; she was originally diagnosed approx 2005 per chart review (she is uncertain). History of OIs: kaposi's sarcoma, recurrent abscesses/skin ulcerations.  Previous Regimens: . Atripla  . Kaletra + Combivir (4th pregnancy) . Darunavir + Ritonavir + Truvada   Genotypes: . wildtype   Subjective:   Chief Complaint  Patient presents with  . New Patient (Initial Visit)    Returning to care for HIV/AIDS, burning feet pain, back pain, leg weakness, depression, pap smear     Beverly [name]  is here today for follow up for her HIV care. Since her diagnosis she has been primarily non-adherent with her medications and has never been on anything consistently. She has over the last 5 years made a few random visits with intent of starting on medications but it has never "stuck" for a variety of reasons. She is very worried people will "find out about her situation" which is why she does not like to keep pill bottles around, depression, constantly moving around (this is what she has cited to be the largest barrier but relates to being  'outed'), "too much going on" and others. She has 4 children and a 15 yr old grandchild. Currently staying with a variety of people (friends and family) and motels on occasion. She is severely depressed and down and often feels hopeless. Feels if she has some stability her sanity and desire for self-care would be better. Taking her medications every day remind her of her diagnosis and she often feels worthless because of this.   She is back here today because we called her regarding updating her pap smear - she has not had a pap smear in many years but has had abnormal ones in the past. Last exam in 2011 (LSIL/CIN-1). She tells me she has had less frequent periods over the last few months. Not currently sexually active. She has had 4 pregnancies and 4 live children. No family history of breast cancer. She performs monthly SBEs.   She states that she has had to have a boil on her left groin surgically drained a few months ago but has failed to heal. It is very irritated by her underwear and sometimes drains clear/yellow fluid. She has several other areas on lower abdomen/pubis that she would like for me to look at today. Also remarks on clear fluid leaking from her belly button along with dry skin and itching. She also feels that she has had bug bites in the past that are now infected.   Other concern is weakness/neuropathy to her lower extremities and back pain. Reports it feels like a burning  pain in her feet and up her left leg. Has been taking 800 mg ibuprofens and her sister's robaxin without any reprieve. Worsens with work and being on her feet all day. Sometimes her legs "give out."   Reports no complaints today suggestive of associated opportunistic infection such as fevers, night sweats, weight loss, anorexia, cough, SOB, nausea, vomiting, diarrhea, headache, sensory changes, lymphadenopathy or oral thrush.   Review of Systems  Constitutional: Negative for chills, fever, malaise/fatigue and weight  loss.  HENT: Negative for sore throat.        No dental complaints   Respiratory: Negative for cough, sputum production and shortness of breath.   Cardiovascular: Negative for chest pain and leg swelling.  Gastrointestinal: Negative for abdominal pain, blood in stool, diarrhea and vomiting.  Genitourinary: Negative for dysuria and flank pain.  Musculoskeletal: Positive for back pain. Negative for joint pain, myalgias and neck pain.  Skin: Negative for rash.       Multiple lesions describe above.   Neurological: Positive for tingling and weakness. Negative for dizziness and headaches.  Psychiatric/Behavioral: Positive for depression. Negative for substance abuse. The patient is not nervous/anxious and does not have insomnia.     Past Medical History:  Diagnosis Date  . Abscess   . AIDS ([place]) 03/19/2015  . Anxiety   . Asthma   . Blood transfusion without reported diagnosis   . Depression   . HIV infection ([place])   . Homeless 03/19/2015  . 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 Leg lesion 03/19/2015  . Major depression, recurrent ([place]) 03/19/2015  . 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 Skin ulcer ([place]) 05/06/2015    Outpatient Medications Prior to Visit  Medication Sig Dispense Refill  . ibuprofen (ADVIL,MOTRIN) 600 MG tablet Take 1 tablet (600 mg total) by mouth every 6 (six) hours as needed. 30 tablet 0  . bictegravir-emtricitabine-tenofovir AF (BIKTARVY) 50-200-25 MG TABS tablet Take 1 tablet by mouth daily. (Patient not taking: Reported on 03/10/2018) 30 tablet 11  . Tetrahydrozoline HCl (VISINE OP) Apply 1-2 drops to eye 4 (four) times daily as needed (dryness.).    Marland Kitchen cephALEXin (KEFLEX) 500 MG capsule Take 1 capsule (500 mg total) by mouth 2 (two) times daily. 14 capsule 0   No facility-administered medications prior to visit.      Allergies  Allergen Reactions  . Bee Venom Anaphylaxis, Shortness Of Breath and Swelling  . Morphine And Related Hives and Itching    Just  can't take "morphine", vicodin is OK    Social History   Tobacco Use  . Smoking status: Current Every Day Smoker    Packs/day: 0.10    Years: 3.00    Pack years: 0.30    Types: Cigarettes  . Smokeless tobacco: Never Used  . Tobacco comment: 1-2 a day  Substance Use Topics  . Alcohol use: Yes    Alcohol/week: 0.0 standard drinks  . Drug use: Yes    Frequency: 2.0 times per week    Types: Marijuana, MDMA (Ecstacy)    Family History  Problem Relation Age of Onset  . Throat cancer Mother   . Throat cancer Father   . Hypertension Other   . Cancer Other   . Diabetes Other   . Asthma Other     Social History   Substance and Sexual Activity  Sexual Activity Yes  . Partners: Male  . Birth control/protection: Condom   Comment: pt. given condoms     Objective:  Vitals:   03/10/18 1032  Weight: 159 lb (72.1 kg)  Height: 5' 1" (1.549 m)   Body mass index is 30.04 kg/m.  Physical Exam  Constitutional: She is oriented to person, place, and time. She appears well-developed and well-nourished.  Seated comfortably in chair. Chronically ill appearing. Non-toxic.   HENT:  Mouth/Throat: Oropharynx is clear and moist and mucous membranes are normal. No oral lesions. Normal dentition. No dental abscesses. No oropharyngeal exudate.  Eyes: Pupils are equal, round, and reactive to light. No scleral icterus.  Cardiovascular: Normal rate, regular rhythm and normal heart sounds.  No murmur heard. Pulmonary/Chest: Effort normal and breath sounds normal. No respiratory distress. She has no wheezes. She has no rales.  Abdominal: Soft. She exhibits no distension. There is no tenderness.  Genitourinary: Vagina normal.    Cervix exhibits motion tenderness. Cervix exhibits no friability. No vaginal discharge found.    Lymphadenopathy:    She has no cervical adenopathy.  Neurological: She is alert and oriented to person, place, and time.  Skin: Skin is warm and dry. Rash noted. Rash  is nodular.  Multiple nodular dark Duve/black lesions scattered over arms and legs (heavier on lower extremities); some with central crust. Non-draining. No surrounding areas of cellulitis. R shin lesion with surrounding hypopigmented skin.   Psychiatric: She is withdrawn. She exhibits a depressed mood. She expresses no suicidal ideation. She expresses no suicidal plans.  Tearful during most of the encounter.     Lab Results Lab Results  Component Value Date   WBC 7.9 12/27/2017   HGB 10.6 (L) 12/27/2017   HCT 32.3 (L) 12/27/2017   MCV 78.8 12/27/2017   PLT 96 (L) 12/27/2017    Lab Results  Component Value Date   CREATININE 0.60 12/27/2017   BUN 11 12/27/2017   NA 135 12/27/2017   K 3.5 12/27/2017   CL 107 12/27/2017   CO2 21 (L) 12/27/2017    Lab Results  Component Value Date   ALT 16 12/27/2017   AST 29 12/27/2017   ALKPHOS 86 12/27/2017   BILITOT 0.8 12/27/2017    Lab Results  Component Value Date   CHOL 146 06/10/2016   HDL 72 06/10/2016   LDLCALC 63 06/10/2016   TRIG 56 06/10/2016   CHOLHDL 2.0 06/10/2016   HIV 1 RNA Quant (copies/mL)  Date Value  06/10/2016 49 (H)  01/09/2016 196,222 (H)  05/06/2015 341 (H)   CD4 T Cell Abs (/uL)  Date Value  11/11/2016 51 (L)  06/10/2016 10 (L)  01/09/2016 <10 (L)     Assessment & Plan:   Problem List Items Addressed This Visit      Nervous and Auditory   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 Discussed the link with long-standing untreated HIV and nerve damage.       Relevant Medications   pregabalin (LYRICA) 25 MG capsule   bictegravir-emtricitabine-tenofovir AF (BIKTARVY) 50-200-25 MG TABS tablet   sulfamethoxazole-trimethoprim (BACTRIM DS,SEPTRA DS) 800-160 MG tablet   mupirocin ointment (BACTROBAN) 2 %     Other   Abnormal Pap smear of cervix    Previously with LSIL/CIN-1 in 2011. Has not had a pap smear  since. She has a lesion on her cervix that I worry may be STI related vs dysplasia. Will see what her pap cytology reveals and refer to GYN accordingly. She has high  risk to progress to HPV related cancer with years of untreated HIV. Discussed concern over finding today.       RESOLVED: AIDS (acquired immune deficiency syndrome) (HCC) - Primary   Relevant Medications   bictegravir-emtricitabine-tenofovir AF (BIKTARVY) 50-200-25 MG TABS tablet   sulfamethoxazole-trimethoprim (BACTRIM DS,SEPTRA DS) 800-160 MG tablet   mupirocin ointment (BACTROBAN) 2 %   Other Relevant Orders   HIV-1 RNA ultraquant reflex to gentyp+   T-helper cell (CD4)- ([facility] clinic only)   RPR   COMPLETE METABOLIC PANEL WITH GFR   CBC with Differential/Platelet   Homeless    Met with Lovena Le with [facility] today to plug into case management. Currently staying with a friend.       Itching    Will try hydroxyzine prn.       Relevant Medications   hydrOXYzine (ATARAX/VISTARIL) 25 MG tablet   Major depression, recurrent ([place])    She is very tearful today and feels in general very hopeless. She contracted for safety today. Feels that she has no support. Has not been on any depression medication consistently in the past. She is willing to meet with Rollene Fare today.       Relevant Medications   hydrOXYzine (ATARAX/VISTARIL) 25 MG tablet   Ulceration, vulva    Large full thickness non-healing ulceration from what she reports was previous I&D. Does not presently look infected. Will need good wound care and resuming her ART to help heal this. Discussed link between these non-healing sores and untreated HIV/AIDS.       Relevant Medications   mupirocin ointment (BACTROBAN) 2 %    Other Visit Diagnoses    Screening for cervical cancer       Relevant Orders   Cytology - PAP   Need for immunization against influenza       Relevant Orders   Flu Vaccine QUAD 36+ mos IM (Completed)   Need for meningitis vaccination       Relevant  Orders   MENINGOCOCCAL MCV4O(MENVEO) (Completed)      Return in about 1 month (around 04/09/2018).   Janene Madeira, MSN, NP-C [facility] for Infectious [place] Pager: [phone] Office: [phone]  03/10/18  10:01 PM

## 2018-03-10 NOTE — Patient Instructions (Signed)
It is a pleasure to meet you and I look forward to working with you.   Please restart your Biktarvy once a day - try to take at the same time each day  Please restart Bactrim one pill once a day - this is an antibiotic that will protect your immune system   Lyrica - nerve pain pill that I want you to try once a day in the evening (around dinner) for 4-5 days then increase to one pill twice a day for your pain. Please consider a back brace at work to help support you.   Hydroxyzine - this is a pill for itching that you can take every 6-8 hours up to 3 times a day.   Mupirocin - cream for your groin to help prevent infection - keep this nice and clean and try to keep your underwear off of the area with pads.   Please come back in 4 weeks to see me again

## 2018-03-14 LAB — CYTOLOGY - PAP
Chlamydia: NEGATIVE
HPV (WINDOPATH): DETECTED — AB
HPV 16/18/45 genotyping: POSITIVE — AB
NEISSERIA GONORRHEA: NEGATIVE

## 2018-03-15 NOTE — Progress Notes (Signed)
Attempted to call patient to inform results of cervical cancer and next steps. Left a voicemail requesting callback to discuss test results.  She will need MRI of the abdomen and pelvis to stage and referral to GYN/Oncology.

## 2018-03-15 NOTE — Addendum Note (Signed)
Addended by: Carter Callas on: 03/15/2018 12:37 PM   Modules accepted: Orders

## 2018-03-16 ENCOUNTER — Telehealth: Payer: Self-pay | Admitting: Infectious Diseases

## 2018-03-16 NOTE — Telephone Encounter (Signed)
Attempted again (3rd attempt) to reach patient requesting call back to inform of her pap smear results. Please if she calls the triage line back let me know so I can talk with her specifically or schedule an appointment here for follow up discussion.   She has cervical cancer and we need to continue with work up and referrals to stage this and get it treated. Patient is currently unaware of this diagnosis.   Janene Madeira, MSN, NP-C [facility] for Infectious Disease [email or web address] Pager: [phone] Office: [phone]

## 2018-03-17 ENCOUNTER — Encounter: Payer: Self-pay | Admitting: Infectious Diseases

## 2018-03-17 ENCOUNTER — Encounter: Payer: Self-pay | Admitting: Family Medicine

## 2018-03-17 ENCOUNTER — Ambulatory Visit (INDEPENDENT_AMBULATORY_CARE_PROVIDER_SITE_OTHER): Payer: Medicaid Other | Admitting: Infectious Diseases

## 2018-03-17 VITALS — BP 115/82 | HR 85 | Temp 98.0°F | Ht 61.0 in | Wt 158.0 lb

## 2018-03-17 DIAGNOSIS — B20 Human immunodeficiency virus [HIV] disease: Secondary | ICD-10-CM | POA: Diagnosis not present

## 2018-03-17 DIAGNOSIS — F332 Major depressive disorder, recurrent severe without psychotic features: Secondary | ICD-10-CM

## 2018-03-17 DIAGNOSIS — D069 Carcinoma in situ of cervix, unspecified: Secondary | ICD-10-CM | POA: Diagnosis not present

## 2018-03-17 NOTE — Progress Notes (Signed)
Name: Beverly [name]  DOB: 03/11/1981 MRN: [id number] PCP: Patient, No Pcp Per   Patient Active Problem List   Diagnosis Date Noted  . Ulceration, vulva 03/10/2018  . Itching 03/10/2018  . 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 Pancytopenia ([place]) 01/09/2016  . Major depression, recurrent ([place]) 03/19/2015  . Homeless 03/19/2015  . 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 Depression 10/17/2013  . Anemia 10/17/2013  . Thrombocytopenia ([place]) 10/17/2013  . Cigarette smoker 10/17/2013  . Asthma 10/17/2013  . Encounter for general adult medical examination without abnormal findings 06/12/2011  . 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 Anxiety state 07/17/2008  . AIDS (acquired immune deficiency syndrome) ([place]) 06/28/2008     Brief Narrative:  Beverly [name] is a 37 y.o. woman with HIV and AIDS. CD4 nadir < 10 and has been < or = to 50 since 2014 and likely beyond that; she was originally diagnosed approx 2005 per chart review (she is uncertain). History of OIs: kaposi's sarcoma, recurrent abscesses/skin ulcerations.  Previous Regimens: . Atripla  . Kaletra + Combivir (4th pregnancy) . Darunavir + Ritonavir + Truvada   Genotypes: . wildtype   Subjective:   Chief Complaint  Patient presents with  . Abnormal Pap Smear    Beverly [name]  is here today to follow up on recent abnormal pelvic exam and pap smear with cytology indicating squamous cell carcinoma. I called her on the phone yesterday to discuss abnormal results. She was understandably very upset and tearful after learning of the result - I requested she come back to clinic so we can discuss in person about recommendations of plan to move forward. She is alone today; was planning for her sister to come with her however there was some childcare obligations and she was unable to join her. She is not dealing with the news very well; drank "a bottle of liquor" yesterday and plans on doing this again today. She  is still in shock and feels that she is just "in a daze and unable to think straight."    Past Medical History:  Diagnosis Date  . Abscess   . AIDS ([place]) 03/19/2015  . Anxiety   . Asthma   . Blood transfusion without reported diagnosis   . Depression   . HIV infection ([place])   . Homeless 03/19/2015  . 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 Leg lesion 03/19/2015  . Major depression, recurrent ([place]) 03/19/2015  . 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 Skin ulcer ([place]) 05/06/2015    Outpatient Medications Prior to Visit  Medication Sig Dispense Refill  . bictegravir-emtricitabine-tenofovir AF (BIKTARVY) 50-200-25 MG TABS tablet Take 1 tablet by mouth daily. 30 tablet 11  . bictegravir-emtricitabine-tenofovir AF (BIKTARVY) 50-200-25 MG TABS tablet Take 1 tablet by mouth daily. Try to take at the same time each day with or without food. 30 tablet 5  . hydrOXYzine (ATARAX/VISTARIL) 25 MG tablet Take 1 tablet (25 mg total) by mouth 3 (three) times daily as needed. 30 tablet 0  . ibuprofen (ADVIL,MOTRIN) 600 MG tablet Take 1 tablet (600 mg total) by mouth every 6 (six) hours as needed. 30 tablet 0  . mupirocin ointment (BACTROBAN) 2 % Place 1 application into the nose 2 (two) times daily. 22 g 2  . pregabalin (LYRICA) 25 MG capsule Take 1 capsule (25 mg total) by mouth 2 (two) times daily. 60 capsule 1  . sulfamethoxazole-trimethoprim (BACTRIM DS,SEPTRA DS) 800-160 MG tablet Take 1  tablet by mouth daily. 30 tablet 5  . Tetrahydrozoline HCl (VISINE OP) Apply 1-2 drops to eye 4 (four) times daily as needed (dryness.).     No facility-administered medications prior to visit.      Allergies  Allergen Reactions  . Bee Venom Anaphylaxis, Shortness Of Breath and Swelling  . Morphine And Related Hives and Itching    Just can't take "morphine", vicodin is OK    Objective:   Vitals:   03/17/18 1501  BP: 115/82  Pulse: 85  Temp: 98 F (36.7 C)  Weight: 158 lb (71.7 kg)  Height: 5' 1"  (1.549  m)   Body mass index is 29.85 kg/m.  Physical exam deferred as the visit was spent discussing results and plan moving forward.   Lab Results Lab Results  Component Value Date   WBC 7.9 12/27/2017   HGB 10.6 (L) 12/27/2017   HCT 32.3 (L) 12/27/2017   MCV 78.8 12/27/2017   PLT 96 (L) 12/27/2017    Lab Results  Component Value Date   CREATININE 0.60 12/27/2017   BUN 11 12/27/2017   NA 135 12/27/2017   K 3.5 12/27/2017   CL 107 12/27/2017   CO2 21 (L) 12/27/2017    Lab Results  Component Value Date   ALT 16 12/27/2017   AST 29 12/27/2017   ALKPHOS 86 12/27/2017   BILITOT 0.8 12/27/2017    Lab Results  Component Value Date   CHOL 146 06/10/2016   HDL 72 06/10/2016   LDLCALC 63 06/10/2016   TRIG 56 06/10/2016   CHOLHDL 2.0 06/10/2016   HIV 1 RNA Quant (copies/mL)  Date Value  06/10/2016 49 (H)  01/09/2016 153,794 (H)  05/06/2015 341 (H)   CD4 T Cell Abs (/uL)  Date Value  11/11/2016 51 (L)  06/10/2016 10 (L)  01/09/2016 <10 (L)     Assessment & Plan:   Problem List Items Addressed This Visit      Unprioritized   AIDS (acquired immune deficiency syndrome) ([place])    Again reinforced need to keep her medication adherence consistent, especially in light of the recent finding of squamous cell carcinoma cells on cervix. She will also continue her bactrim once daily. Will add azithromycin at upcoming appointment for MAC proph after she has taken her Biktarvy for 4-6 weeks.       Major depression, recurrent [facility])    She will meet with Beverly [name] today. Using alcohol to cope/numb. No suicide plan/intent and contracts for safety. Also open to enrolling with bridge counselor (discussed with Beverly [name] personally) and having Beverly [name] reach out to her in the community. She will need support from our team to help her navigate next steps in her health care.       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 Discussed next steps to get her in to see gyn/oncology team. Will place order for MR of abd/pelvis today, but if referral team feels alternative testing is better indicated will defer to them for staging work up. She understands to expect several phone calls to work on setting up appointments to determine the plan.       Relevant Orders   Ambulatory referral to Obstetrics / Gynecology   Ambulatory Referral for Home Visit   Ambulatory referral to Gynecologic Oncology      No follow-ups on file. - as scheduled with me in 4 weeks.   Colletta [place]  Doren Custard, MSN, NP-C [facility] for Infectious [place] Pager: [phone] Office: [phone]  03/19/18  9:37 PM

## 2018-03-17 NOTE — Patient Instructions (Signed)
Will need to do some blood work today.   You should expect some phone calls from gynecology/oncology for next steps. Will also arrange for scans to get more information for you.   Please continue to take your Biktarvy and Bactrim once a day as you are  We are here for you and care about you!

## 2018-03-18 NOTE — Assessment & Plan Note (Signed)
She has visible growth/lesion in cervical os and cytology indicating SCC. Discussed next steps to get her in to see gyn/oncology team. Will place order for MR of abd/pelvis today, but if referral team feels alternative testing is better indicated will defer to them for staging work up. She understands to expect several phone calls to work on setting up appointments to determine the plan.

## 2018-03-18 NOTE — Assessment & Plan Note (Signed)
Again reinforced need to keep her medication adherence consistent, especially in light of the recent finding of squamous cell carcinoma cells on cervix. She will also continue her bactrim once daily. Will add azithromycin at upcoming appointment for MAC proph after she has taken her Biktarvy for 4-6 weeks.

## 2018-03-18 NOTE — Assessment & Plan Note (Addendum)
She will meet with Marcie Bal today. Using alcohol to cope/numb. No suicide plan/intent and contracts for safety. Also open to enrolling with bridge counselor (discussed with [place] personally) and having [name] reach out to her in the community. She will need support from our team to help her navigate next steps in her health care.

## 2018-03-22 ENCOUNTER — Telehealth: Payer: Self-pay | Admitting: *Deleted

## 2018-03-22 NOTE — Telephone Encounter (Signed)
Called and scheduled the patient for a new appt on 10/9.

## 2018-03-22 NOTE — Telephone Encounter (Signed)
Thank you for the message confirming appointment - greatly appreciated!

## 2018-03-24 ENCOUNTER — Ambulatory Visit: Payer: Medicaid Other

## 2018-03-28 ENCOUNTER — Other Ambulatory Visit: Payer: Self-pay | Admitting: Infectious Diseases

## 2018-03-28 DIAGNOSIS — D4959 Neoplasm of unspecified behavior of other genitourinary organ: Secondary | ICD-10-CM

## 2018-03-29 ENCOUNTER — Telehealth: Payer: Self-pay | Admitting: *Deleted

## 2018-03-29 NOTE — Progress Notes (Deleted)
[place] at [facility]   Consult Note: New Patient FIRST VISIT   Consult was requested by Dr. Marland Kitchen   No chief complaint on file.   ***GYN Oncologic Summary 1. *** o ***  HPI: Ms. Beverly [name]  is a *** nice 37 y.o.  ***  *** Followed for her HIV by the Infectious Disease clinic who caught that her cervical cancer screening hadn't been performed since 2011. Patient chart notes history of non-compliance and homelessness at times. 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 She did followup with the call-in for the pap and was noted to have a gross cervical lesion 03/10/18 Pap showed "SCCa" .  History of AIDS and Kaposi's Sarcoma 2017.  Has + methamphetamine 3 months ago  Imported EPIC Oncologic History: ***  No history exists.    Measurement of disease: No results for input(s): CA125, CAN125, CEA, CA199, ESTRADIOL, INHBB in the last 8760 hours.  Invalid input(s): INHIBINA .RESUFAST[CAN125,CEA:4  *** . ***  Radiology: . ***  Outpatient Encounter Medications as of 03/30/2018  Medication Sig  . bictegravir-emtricitabine-tenofovir AF (BIKTARVY) 50-200-25 MG TABS tablet Take 1 tablet by mouth daily.  . bictegravir-emtricitabine-tenofovir AF (BIKTARVY) 50-200-25 MG TABS tablet Take 1 tablet by mouth daily. Try to take at the same time each day with or without food.  . hydrOXYzine (ATARAX/VISTARIL) 25 MG tablet Take 1 tablet (25 mg total) by mouth 3 (three) times daily as needed.  Marland Kitchen ibuprofen (ADVIL,MOTRIN) 600 MG tablet Take 1 tablet (600 mg total) by mouth every 6 (six) hours as needed.  . mupirocin ointment (BACTROBAN) 2 % Place 1 application into the nose 2 (two) times daily.  . pregabalin (LYRICA) 25 MG capsule Take 1 capsule (25 mg total) by mouth 2 (two) times daily.  Marland Kitchen sulfamethoxazole-trimethoprim (BACTRIM DS,SEPTRA DS) 800-160 MG tablet Take 1 tablet by mouth daily.  . Tetrahydrozoline HCl (VISINE OP) Apply 1-2 drops to eye 4  (four) times daily as needed (dryness.).   No facility-administered encounter medications on file as of 03/30/2018.    Allergies  Allergen Reactions  . Bee Venom Anaphylaxis, Shortness Of Breath and Swelling  . Morphine And Related Hives and Itching    Just can't take "morphine", vicodin is OK    Past Medical History:  Diagnosis Date  . Abscess   . AIDS ([place]) 03/19/2015  . Anxiety   . Asthma   . Blood transfusion without reported diagnosis   . Depression   . HIV infection ([place])   . Homeless 03/19/2015  . Kaposi's sarcoma ([place]) 05/11/2016  . Leg lesion 03/19/2015  . Major depression, recurrent ([place]) 03/19/2015  . 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 Skin ulcer ([place]) 05/06/2015   Past Surgical History:  Procedure Laterality Date  . CESAREAN SECTION    . INCISION AND DRAINAGE ABSCESS Right 01/10/2016   Procedure: INCISION AND DRAINAGE RIGHT BUTTOCK, LEFT LABIAL , EXCISION AND DRAINAGE OF  RIGHT AXILLARY ABSCESS;  Surgeon: Excell Seltzer, MD;  Location: [facility];  Service: General;  Laterality: Right;        Past Gynecological History:   GYNECOLOGIC HISTORY:  . [name] last menstrual period was 03/11/2018. *** . Menarche: *** years old . P *** . Contraceptive*** . HRT ***  . Last Pap *** Family Hx:  Family History  Problem Relation Age of Onset  . Throat cancer Mother   . Throat cancer Father   . Hypertension Other   . Cancer Other   .  Diabetes Other   . Asthma Other    Social Hx:  Marland Kitchen Tobacco use: *** . Alcohol use: *** . Illicit Drug use: *** . Illicit IV Drug use: ***    Review of Systems: Review of Systems - Oncology ***  Vitals: There were no vitals filed for this visit. There is no height or weight on file to calculate BMI.  Physical Exam: General :  ***Well developed, 37 y.o., female in no apparent distress HEENT:  Normocephalic/atraumatic, symmetric, EOMI, eyelids normal Neck:   Supple, no masses.  Lymphatics:  No cervical/ submandibular/  supraclavicular/ infraclavicular/ inguinal adenopathy Respiratory:  Respirations unlabored, no use of accessory muscles CV:   Deferred Breast:  Deferred Musculoskeletal: No CVA tenderness, normal muscle strength. Abdomen:  *** Soft, non-tender and nondistended. No evidence of hernia. No masses. Extremities:  No lymphedema, no erythema, non-tender. Skin:   Normal inspection Neuro/Psych:  No focal motor deficit, no abnormal mental status. Normal gait. Normal affect. Alert and oriented to person, place, and time  Genito Urinary: Vulva: ***Normal external female genitalia.  Bladder/urethra: Urethral meatus normal in size and location. No lesions or   masses, well supported bladder ***Speculum exam: Vagina: ***No lesion, no discharge, no bleeding. Cervix: ***Normal appearing, no lesions. Bimanual exam: *** Uterus: ***Normal size, mobile.  Adnexa: ***No masses. Rectovaginal:  ***Good tone, no masses, no cul de sac nodularity, no parametrial involvement or nodularity.   Assessment  *** ECOG PERFORMANCE STATUS: {CHL ONC ECOG Ps:[id number]}  Plan  1. Data reviewed ? *** I independently reviewed the images and the radiology reports and discussed my interpretation in the presence of *** ? *** ? I reviewed her referring doctor's office notes and I have summarized in the HPI ? History was obtained from *** ? We reviewed *** tumor marker 2. Pap shows SCCa, this is not diagnostic of cancer alone ? *** 3. *** ? *** 4. ?borderline personality tendencies? Psych consult outpatient o ***  Face to face time with patient was *** minutes. Over 50% of this time was spent on counseling and coordination of care.  Isabel Caprice, MD  03/29/2018, 7:20 AM    Cc: *** (Referring ***) *** (PCP)

## 2018-03-30 ENCOUNTER — Telehealth: Payer: Self-pay | Admitting: *Deleted

## 2018-03-30 ENCOUNTER — Ambulatory Visit (HOSPITAL_COMMUNITY): Payer: Medicaid Other

## 2018-03-30 ENCOUNTER — Inpatient Hospital Stay: Payer: Medicaid Other | Admitting: Obstetrics

## 2018-03-30 NOTE — Telephone Encounter (Signed)
Her insurance denied the MRI and requested CT scan instead. I have put in the orders and let Caryl Pina know regarding scheduling it.   If she prefer to wait to speak with the GYN oncology team first then I am OK with that so they can support her a little more cohesively with all she currently has going on.   I agree - this is a lot and I hope she continues to hang in there. We are here for her!

## 2018-03-30 NOTE — Telephone Encounter (Signed)
Called and spoke with the patient. Rescheduled her appt from today to next Wednesday.

## 2018-03-30 NOTE — Telephone Encounter (Signed)
Patient called to report that she was not able to get her MRI and wants to speak with Colletta Maryland to find out what she needs to do next. Advised she was called at work yesterday and told that because she does not have insurance they can not do the MRI. Advised not sure how it works but she should call the cancer center to see if they have assistance for her and maybe check into applying for Medicaid since she has the new diagnosis. Advised I will let Colletta Maryland know and that she wants a call back.   She is upset as she took a day off and feels "this is too much". Advised her to hang in there and we will help her however we can.

## 2018-04-01 NOTE — Telephone Encounter (Signed)
Patient has scan scheduled for 04/05/18.

## 2018-04-05 ENCOUNTER — Ambulatory Visit (HOSPITAL_COMMUNITY): Admission: RE | Admit: 2018-04-05 | Payer: Medicaid Other | Source: Ambulatory Visit

## 2018-04-05 NOTE — Progress Notes (Signed)
[place] at [facility] Note: New Patient FIRST VISIT   Consult was requested by Janene Madeira, NP for a Pap showing SCCa   Chief Complaint  Patient presents with  . Squamous cell carcinoma in situ    GYN Oncologic Summary 1. TBD o .  HPI: Ms. Beverly [name]  is a nice 37 y.o.  P4  She is followed by [facility] for her HIV. 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 NOTES state she hadn't had Pap for years.  Has h/o vulvar abcess / lesion in past (2017) was excised by general surgery and no dysplasia seen.  She notes she has menstrual cycles every 4 weeks, normal flow, but over the past 6 months has noted post-coital spotting ~3times. For one year she has noted some lateral leg pain, but does not describe any sciatic pain.  Imported EPIC Oncologic History:   No history exists.    Measurement of disease: . TBD  Radiology: No results found. .  .   Outpatient Encounter Medications as of 04/06/2018  Medication Sig  . bictegravir-emtricitabine-tenofovir AF (BIKTARVY) 50-200-25 MG TABS tablet Take 1 tablet by mouth daily. Try to take at the same time each day with or without food.  . hydrOXYzine (ATARAX/VISTARIL) 25 MG tablet Take 1 tablet (25 mg total) by mouth 3 (three) times daily as needed.  Marland Kitchen ibuprofen (ADVIL,MOTRIN) 600 MG tablet Take 1 tablet (600 mg total) by mouth every 6 (six) hours as needed.  . mupirocin ointment (BACTROBAN) 2 % Place 1 application into the nose 2 (two) times daily. (Patient taking differently: Place 1 application into the nose as needed. )  . sulfamethoxazole-trimethoprim (BACTRIM DS,SEPTRA DS) 800-160 MG tablet Take 1 tablet by mouth daily.  . pregabalin (LYRICA) 25 MG capsule Take 1 capsule (25 mg total) by mouth 2 (two) times daily. (Patient not taking: Reported on 04/06/2018)  . [DISCONTINUED] bictegravir-emtricitabine-tenofovir AF  (BIKTARVY) 50-200-25 MG TABS tablet Take 1 tablet by mouth daily. (Patient not taking: Reported on 04/06/2018)  . [DISCONTINUED] Tetrahydrozoline HCl (VISINE OP) Apply 1-2 drops to eye 4 (four) times daily as needed (dryness.).   No facility-administered encounter medications on file as of 04/06/2018.    Allergies  Allergen Reactions  . Bee Venom Anaphylaxis, Shortness Of Breath and Swelling  . Morphine And Related Hives and Itching    Just can't take "morphine", vicodin is OK    Past Medical History:  Diagnosis Date  . Abscess   . AIDS ([place]) 03/19/2015  . Anxiety   . Asthma   . Blood transfusion without reported diagnosis   . Depression   . HIV infection ([place])   . Homeless 03/19/2015   states stays with family or friends. Does not rent or own a home, but does not live on streets.  . Kaposi's sarcoma ([place]) 05/11/2016  . Leg lesion 03/19/2015  . Major depression, recurrent ([place]) 03/19/2015  . 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 Skin ulcer ([place]) 05/06/2015   Past Surgical History:  Procedure Laterality Date  . CESAREAN SECTION    . INCISION AND DRAINAGE ABSCESS Right 01/10/2016   Procedure: INCISION AND DRAINAGE RIGHT BUTTOCK, LEFT LABIAL , EXCISION AND DRAINAGE OF  RIGHT AXILLARY ABSCESS;  Surgeon: Excell Seltzer, MD;  Location: [facility];  Service: General;  Laterality: Right;  . TUBAL LIGATION  2005  Past Gynecological History:   GYNECOLOGIC HISTORY:  . [name] last menstrual period was 03/11/2018.  . Menarche: 37 years old . P 4 . Contraceptive prior OCP now BTL . HRT NA  . Last Pap NA Family Hx:  Family History  Problem Relation Age of Onset  . Throat cancer Mother        smoker  . Throat cancer Father        smoker  . Hypertension Father   . Cirrhosis Father   . Hypertension Other   . Cancer Other        smoker  . Diabetes Other   . Asthma Other    Social Hx:  Marland Kitchen Tobacco use: current daily . Alcohol use: 1-2 / week (although  notes discuss different) . Illicit Drug use: MJ and Ecstacy. . Illicit IV Drug use: No    Review of Systems: Review of Systems  Constitutional: Positive for appetite change.  Endocrine: Positive for hot flashes.  Genitourinary: Positive for frequency.   Musculoskeletal: Positive for arthralgias, back pain and myalgias.  Skin: Positive for itching and rash.  Neurological: Positive for headaches and numbness.  Psychiatric/Behavioral: Positive for depression. The patient is nervous/anxious.   All other systems reviewed and are negative.   Vitals:  Vitals:   04/06/18 1043  BP: 126/83  Pulse: 84  Resp: 20  Temp: 98.6 F (37 C)  SpO2: 100%   Vitals:   04/06/18 1043  Weight: 161 lb 6.4 oz (73.2 kg)  Height: 5\' 1"  (1.549 m)   Body mass index is 30.5 kg/m.  Physical Exam: General :  Well developed, 37 y.o., female in no apparent distress HEENT:  Normocephalic/atraumatic, symmetric, EOMI, eyelids normal Neck:   Supple, no masses.  Lymphatics:  No cervical/ submandibular/ supraclavicular/ infraclavicular/ inguinal adenopathy Respiratory:  Respirations unlabored, no use of accessory muscles CV:   Deferred Breast:  Deferred Musculoskeletal: No CVA tenderness, normal muscle strength. Abdomen:  Soft, non-tender and nondistended. No evidence of hernia. No masses. Extremities:  +hyperpigmented region right tibia. No lymphedema, no erythema, non-tender. Skin:   Small areas / lesions versus scars Neuro/Psych:  No focal motor deficit, no abnormal mental status. Normal gait. Normal affect. Alert and oriented to person, place, and time  Genito Urinary: Vulva: Left vulva notable for thickened AWL cracked and raw at fold Bladder/urethra: Urethral meatus normal in size and location. No lesions or   masses, well supported bladder Speculum exam: Vagina: No lesion, no discharge, no bleeding. Cervix: Normal appearing, no lesions. Bimanual exam:  Uterus: Normal size, mobile.  Adnexa: No  masses. Rectovaginal:  Deferred to OR  Assessment  Pap showing SCCA HIV  Plan  1. Complexity of visit ? This is a new problem and additional workup is planned ? Data reviewed ? Currently there are no images to review;  ? However we did review her pathology reports and I explained that Pap smears are not diagnostic of cancer, that we need more tissue ? I reviewed her referring doctor's office notes and I have summarized in the HPI ? History was obtained from the patient and chart ? I am ordering CD4 so we have an idea of where she is now. ? This is an undiagnosed new problem with uncertain prognosis (lump/mass) and plan is for minor surgery with comorbidities identified including immunosuppression and noncompliance 2. HIV status ? She appears on paper to be noncompliant with her medical care.  ? She has societal and personal barriers to care ? We need  to be sure it is safe to proceed with surgery with her CD4 count and so we will request that ID OK this. 3. Schedule for cone biopsy and vulvar biopsies. o Do full pelvic/rectal at time of procedure.  Face to face time with patient was 60 minutes. Over 50% of this time was spent on counseling and coordination of care.   Mart Piggs, MD Gynecologic Oncologist 04/06/2018, 4:17 PM    Cc: Carlos Callas, NP (Referring PCP)

## 2018-04-05 NOTE — H&P (View-Only) (Signed)
[place] at [facility] Note: New Patient FIRST VISIT   Consult was requested by Janene Madeira, NP for a Pap showing SCCa   Chief Complaint  Patient presents with  . Squamous cell carcinoma in situ    GYN Oncologic Summary 1. TBD o .  HPI: Ms. Beverly [name]  is a nice 37 y.o.  P4  She is followed by [facility] for her HIV. 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 NOTES state she hadn't had Pap for years.  Has h/o vulvar abcess / lesion in past (2017) was excised by general surgery and no dysplasia seen.  She notes she has menstrual cycles every 4 weeks, normal flow, but over the past 6 months has noted post-coital spotting ~3times. For one year she has noted some lateral leg pain, but does not describe any sciatic pain.  Imported EPIC Oncologic History:   No history exists.    Measurement of disease: . TBD  Radiology: No results found. .  .   Outpatient Encounter Medications as of 04/06/2018  Medication Sig  . bictegravir-emtricitabine-tenofovir AF (BIKTARVY) 50-200-25 MG TABS tablet Take 1 tablet by mouth daily. Try to take at the same time each day with or without food.  . hydrOXYzine (ATARAX/VISTARIL) 25 MG tablet Take 1 tablet (25 mg total) by mouth 3 (three) times daily as needed.  Marland Kitchen ibuprofen (ADVIL,MOTRIN) 600 MG tablet Take 1 tablet (600 mg total) by mouth every 6 (six) hours as needed.  . mupirocin ointment (BACTROBAN) 2 % Place 1 application into the nose 2 (two) times daily. (Patient taking differently: Place 1 application into the nose as needed. )  . sulfamethoxazole-trimethoprim (BACTRIM DS,SEPTRA DS) 800-160 MG tablet Take 1 tablet by mouth daily.  . pregabalin (LYRICA) 25 MG capsule Take 1 capsule (25 mg total) by mouth 2 (two) times daily. (Patient not taking: Reported on 04/06/2018)  . [DISCONTINUED] bictegravir-emtricitabine-tenofovir AF  (BIKTARVY) 50-200-25 MG TABS tablet Take 1 tablet by mouth daily. (Patient not taking: Reported on 04/06/2018)  . [DISCONTINUED] Tetrahydrozoline HCl (VISINE OP) Apply 1-2 drops to eye 4 (four) times daily as needed (dryness.).   No facility-administered encounter medications on file as of 04/06/2018.    Allergies  Allergen Reactions  . Bee Venom Anaphylaxis, Shortness Of Breath and Swelling  . Morphine And Related Hives and Itching    Just can't take "morphine", vicodin is OK    Past Medical History:  Diagnosis Date  . Abscess   . AIDS ([place]) 03/19/2015  . Anxiety   . Asthma   . Blood transfusion without reported diagnosis   . Depression   . HIV infection ([place])   . Homeless 03/19/2015   states stays with family or friends. Does not rent or own a home, but does not live on streets.  . Kaposi's sarcoma ([place]) 05/11/2016  . Leg lesion 03/19/2015  . Major depression, recurrent ([place]) 03/19/2015  . 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 Skin ulcer ([place]) 05/06/2015   Past Surgical History:  Procedure Laterality Date  . CESAREAN SECTION    . INCISION AND DRAINAGE ABSCESS Right 01/10/2016   Procedure: INCISION AND DRAINAGE RIGHT BUTTOCK, LEFT LABIAL , EXCISION AND DRAINAGE OF  RIGHT AXILLARY ABSCESS;  Surgeon: Excell Seltzer, MD;  Location: [facility];  Service: General;  Laterality: Right;  . TUBAL LIGATION  2005  Past Gynecological History:   GYNECOLOGIC HISTORY:  . [name] last menstrual period was 03/11/2018.  . Menarche: 37 years old . P 4 . Contraceptive prior OCP now BTL . HRT NA  . Last Pap NA Family Hx:  Family History  Problem Relation Age of Onset  . Throat cancer Mother        smoker  . Throat cancer Father        smoker  . Hypertension Father   . Cirrhosis Father   . Hypertension Other   . Cancer Other        smoker  . Diabetes Other   . Asthma Other    Social Hx:  Marland Kitchen Tobacco use: current daily . Alcohol use: 1-2 / week (although  notes discuss different) . Illicit Drug use: MJ and Ecstacy. . Illicit IV Drug use: No    Review of Systems: Review of Systems  Constitutional: Positive for appetite change.  Endocrine: Positive for hot flashes.  Genitourinary: Positive for frequency.   Musculoskeletal: Positive for arthralgias, back pain and myalgias.  Skin: Positive for itching and rash.  Neurological: Positive for headaches and numbness.  Psychiatric/Behavioral: Positive for depression. The patient is nervous/anxious.   All other systems reviewed and are negative.   Vitals:  Vitals:   04/06/18 1043  BP: 126/83  Pulse: 84  Resp: 20  Temp: 98.6 F (37 C)  SpO2: 100%   Vitals:   04/06/18 1043  Weight: 161 lb 6.4 oz (73.2 kg)  Height: 5\' 1"  (1.549 m)   Body mass index is 30.5 kg/m.  Physical Exam: General :  Well developed, 37 y.o., female in no apparent distress HEENT:  Normocephalic/atraumatic, symmetric, EOMI, eyelids normal Neck:   Supple, no masses.  Lymphatics:  No cervical/ submandibular/ supraclavicular/ infraclavicular/ inguinal adenopathy Respiratory:  Respirations unlabored, no use of accessory muscles CV:   Deferred Breast:  Deferred Musculoskeletal: No CVA tenderness, normal muscle strength. Abdomen:  Soft, non-tender and nondistended. No evidence of hernia. No masses. Extremities:  +hyperpigmented region right tibia. No lymphedema, no erythema, non-tender. Skin:   Small areas / lesions versus scars Neuro/Psych:  No focal motor deficit, no abnormal mental status. Normal gait. Normal affect. Alert and oriented to person, place, and time  Genito Urinary: Vulva: Left vulva notable for thickened AWL cracked and raw at fold Bladder/urethra: Urethral meatus normal in size and location. No lesions or   masses, well supported bladder Speculum exam: Vagina: No lesion, no discharge, no bleeding. Cervix: Normal appearing, no lesions. Bimanual exam:  Uterus: Normal size, mobile.  Adnexa: No  masses. Rectovaginal:  Deferred to OR  Assessment  Pap showing SCCA HIV  Plan  1. Complexity of visit ? This is a new problem and additional workup is planned ? Data reviewed ? Currently there are no images to review;  ? However we did review her pathology reports and I explained that Pap smears are not diagnostic of cancer, that we need more tissue ? I reviewed her referring doctor's office notes and I have summarized in the HPI ? History was obtained from the patient and chart ? I am ordering CD4 so we have an idea of where she is now. ? This is an undiagnosed new problem with uncertain prognosis (lump/mass) and plan is for minor surgery with comorbidities identified including immunosuppression and noncompliance 2. HIV status ? She appears on paper to be noncompliant with her medical care.  ? She has societal and personal barriers to care ? We need  to be sure it is safe to proceed with surgery with her CD4 count and so we will request that ID OK this. 3. Schedule for cone biopsy and vulvar biopsies. o Do full pelvic/rectal at time of procedure.  Face to face time with patient was 60 minutes. Over 50% of this time was spent on counseling and coordination of care.   Mart Piggs, MD Gynecologic Oncologist 04/06/2018, 4:17 PM    Cc: [place] Callas, NP (Referring PCP)

## 2018-04-06 ENCOUNTER — Encounter: Payer: Self-pay | Admitting: Oncology

## 2018-04-06 ENCOUNTER — Encounter: Payer: Self-pay | Admitting: Obstetrics

## 2018-04-06 ENCOUNTER — Inpatient Hospital Stay: Payer: Medicaid Other

## 2018-04-06 ENCOUNTER — Inpatient Hospital Stay: Payer: Medicaid Other | Attending: Obstetrics | Admitting: Obstetrics

## 2018-04-06 VITALS — BP 126/83 | HR 84 | Temp 98.6°F | Resp 20 | Ht 61.0 in | Wt 161.4 lb

## 2018-04-06 DIAGNOSIS — R319 Hematuria, unspecified: Principal | ICD-10-CM

## 2018-04-06 DIAGNOSIS — B20 Human immunodeficiency virus [HIV] disease: Secondary | ICD-10-CM | POA: Diagnosis not present

## 2018-04-06 DIAGNOSIS — D099 Carcinoma in situ, unspecified: Secondary | ICD-10-CM | POA: Diagnosis not present

## 2018-04-06 DIAGNOSIS — N390 Urinary tract infection, site not specified: Secondary | ICD-10-CM

## 2018-04-06 LAB — CBC WITH DIFFERENTIAL (CANCER CENTER ONLY)
Abs Immature Granulocytes: 0.02 10*3/uL (ref 0.00–0.07)
BASOS PCT: 1 %
Basophils Absolute: 0 10*3/uL (ref 0.0–0.1)
EOS ABS: 0.2 10*3/uL (ref 0.0–0.5)
Eosinophils Relative: 5 %
HEMATOCRIT: 34.3 % — AB (ref 36.0–46.0)
Hemoglobin: 10.8 g/dL — ABNORMAL LOW (ref 12.0–15.0)
IMMATURE GRANULOCYTES: 1 %
Lymphocytes Relative: 23 %
Lymphs Abs: 1 10*3/uL (ref 0.7–4.0)
MCH: 27.1 pg (ref 26.0–34.0)
MCHC: 31.5 g/dL (ref 30.0–36.0)
MCV: 86.2 fL (ref 80.0–100.0)
MONO ABS: 0.4 10*3/uL (ref 0.1–1.0)
Monocytes Relative: 9 %
NEUTROS PCT: 61 %
Neutro Abs: 2.7 10*3/uL (ref 1.7–7.7)
Platelet Count: 230 10*3/uL (ref 150–400)
RBC: 3.98 MIL/uL (ref 3.87–5.11)
RDW: 16.1 % — AB (ref 11.5–15.5)
WBC Count: 4.4 10*3/uL (ref 4.0–10.5)
nRBC: 0 % (ref 0.0–0.2)

## 2018-04-06 LAB — COMPREHENSIVE METABOLIC PANEL
ALBUMIN: 3.3 g/dL — AB (ref 3.5–5.0)
ALT: 15 U/L (ref 0–44)
ANION GAP: 8 (ref 5–15)
AST: 22 U/L (ref 15–41)
Alkaline Phosphatase: 95 U/L (ref 38–126)
BUN: 12 mg/dL (ref 6–20)
CALCIUM: 8.9 mg/dL (ref 8.9–10.3)
CO2: 23 mmol/L (ref 22–32)
CREATININE: 0.78 mg/dL (ref 0.44–1.00)
Chloride: 109 mmol/L (ref 98–111)
GFR calc non Af Amer: >60 mL/min (ref >60)
Glucose, Bld: 93 mg/dL (ref 70–99)
Potassium: 3.9 mmol/L (ref 3.5–5.1)
SODIUM: 140 mmol/L (ref 135–145)
Total Bilirubin: 0.5 mg/dL (ref 0.3–1.2)
Total Protein: 9.1 g/dL — ABNORMAL HIGH (ref 6.5–8.1)

## 2018-04-06 NOTE — Patient Instructions (Signed)
Plan to have a cold knife conization of the cervix, endocervical curettage, vulvar biopsies at the [facility] on April 14, 2018.  You will receive a phone call from the pre-surgical RN to discuss instructions.  Please call for any questions or concerns.   Cervical Conization  Cervical conization (cone biopsy) is a procedure in which a cone-shaped portion of the cervix is cut out so that it can be examined under a microscope. The procedure is done to check for cancer cells or cells that might turn into cancer (precancerous cells). You may have this procedure if:  You have abnormal bleeding from your cervix.  You had an abnormal Pap test.  Something abnormal was seen on your cervix during an exam.  This procedure is performed in either a health care provider's office or in an operating room. Tell a health care provider about:  Any allergies you have.  All medicines you are taking, including vitamins, herbs, eye drops, creams, and over-the-counter medicines.  Any problems you or family members have had with the use of anesthetic medicines.  Any blood disorders you have.  Any surgeries you have had.  Any medical conditions you have.  Your smoking habits.  When you normally have your period.  Whether you are pregnant or may be pregnant. What are the risks? Generally, this is a safe procedure. However, problems may occur, including:  Heavy bleeding for several days or weeks after the procedure.  Allergic reactions to medicines or dyes.  Increased risk of preterm labor in future pregnancies.  Infection (rare).  Damage to the cervix or other structures or organs (rare).  What happens before the procedure? Staying hydrated Follow instructions from your health care provider about hydration.  Eating and drinking restrictions  Follow instructions from the pre-surgical RN.  General instructions  Do not douche, have sex, use tampons, or use any  vaginal medicines before the procedure as told by your health care provider.  You may be asked to empty your bladder and bowel right before the procedure.  Ask your health care provider about: ? Changing or stopping your normal medicines. This is important if you take diabetes medicines or blood thinners. ? Taking medicines such as aspirin and ibuprofen. These medicines can thin your blood. Do not take these medicines before your procedure if your doctor tells you not to.  Plan to have someone take you home from the hospital or clinic. What happens during the procedure?  To reduce your risk of infection: ? Your health care team will wash or sanitize their hands. ? Your skin will be washed with soap. ? Hair may be removed from the surgical area.  You will undress from the waist down and be given a gown to wear.  You will lie on an examining table and put your feet in stirrups.  An IV tube will be inserted into one of your veins.  You will be given one or more of the following: ? A medicine to help you relax (sedative). ? A medicine to numb the area (local anesthetic). ? A medicine to make you fall asleep (general anesthetic). ? A medicine that numbs the cervix (cervical block).  A lubricated device called a speculum will be inserted into your vagina. It will be used to spread open the walls of the vagina so your health care provider can see the inside of the vagina and cervix better.  An instrument that has a magnifying lens and a light (colposcope) will  let your health care provider examine the cervix more closely.  Your health care provider will apply a solution to your cervix. This turns abnormal areas a pale color.  A tissue sample will be removed from the cervix using one of the following methods: ? The cold knife method. In this method, the tissue is cut out with a knife (scalpel). ? The loop electrosurgical excision procedure (LEEP) method. In this method, the tissue is cut  out with a thin wire that can burn (cauterize) the tissue with an electrical current. ? Laser treatment method. In this method, the tissue is cut out and then cauterized with a laser beam to prevent bleeding.  Your health care provider will apply a paste over the biopsy areas to help control bleeding.  The tissue sample will be examined under a microscope. The procedure may vary among health care providers and hospitals. What happens after the procedure?  Your blood pressure, heart rate, breathing rate, and blood oxygen level will be monitored often until the medicines you were given have worn off.  If you were given a local anesthetic, you will rest at the clinic or hospital until you are stable and feel ready to go home.  If you were given a general anesthetic, you may be monitored for a longer period of time.  You may have some cramping.  You may have bloody discharge or light to moderate bleeding.  You may have dark discharge coming from your vagina. This is from the paste used on the cervix to prevent bleeding. Summary  Cervical conization is a procedure in which a cone-shaped portion of the cervix is cut out so that it can be examined under a microscope.  The procedure is done to check for cancer cells or cells that might turn into cancer (precancerous cells). This information is not intended to replace advice given to you by your health care provider. Make sure you discuss any questions you have with your health care provider. Document Released: 03/18/2005 Document Revised: 06/10/2016 Document Reviewed: 06/10/2016 Elsevier Interactive Patient Education  2017 [employer].

## 2018-04-07 LAB — RPR: RPR: NONREACTIVE

## 2018-04-07 LAB — T-HELPER CELLS (CD4) COUNT (NOT AT ARMC)
CD4 T CELL ABS: 20 /uL — AB (ref 400–2700)
CD4 T CELL HELPER: 2 % — AB (ref 33–55)

## 2018-04-11 ENCOUNTER — Telehealth: Payer: Self-pay

## 2018-04-11 ENCOUNTER — Encounter: Payer: Self-pay | Admitting: Infectious Diseases

## 2018-04-11 ENCOUNTER — Ambulatory Visit (INDEPENDENT_AMBULATORY_CARE_PROVIDER_SITE_OTHER): Payer: Medicaid Other | Admitting: Infectious Diseases

## 2018-04-11 VITALS — BP 130/79 | HR 96 | Temp 98.0°F | Ht 61.0 in | Wt 166.0 lb

## 2018-04-11 DIAGNOSIS — D069 Carcinoma in situ of cervix, unspecified: Secondary | ICD-10-CM

## 2018-04-11 DIAGNOSIS — F419 Anxiety disorder, unspecified: Secondary | ICD-10-CM | POA: Diagnosis not present

## 2018-04-11 DIAGNOSIS — B20 Human immunodeficiency virus [HIV] disease: Secondary | ICD-10-CM | POA: Diagnosis present

## 2018-04-11 DIAGNOSIS — F329 Major depressive disorder, single episode, unspecified: Secondary | ICD-10-CM | POA: Diagnosis not present

## 2018-04-11 DIAGNOSIS — G63 Polyneuropathy in diseases classified elsewhere: Secondary | ICD-10-CM

## 2018-04-11 MED ORDER — AZITHROMYCIN 600 MG PO TABS: 1200.0000 mg | ORAL_TABLET | ORAL | 6 refills | Status: DC

## 2018-04-11 MED ORDER — TRAZODONE HCL 50 MG PO TABS: 50.0000 mg | ORAL_TABLET | Freq: Every day | ORAL | 2 refills | Status: DC

## 2018-04-11 NOTE — Telephone Encounter (Signed)
Called patients insurance to initiate PA for Pregabalin 25 mg. PA submitted through phone at [phone]. Prior authorizations can take up to 24 hours to be reviewed. A fax with a determination should be faxed to our office. Can call [phone] with PA # [id number] if fax determination is not back within 24 hour. [place]

## 2018-04-11 NOTE — Telephone Encounter (Signed)
Thank you very much 

## 2018-04-11 NOTE — Patient Instructions (Addendum)
Please continue your biktarvy and bactrim every day as you are doing now.   Will let you know the results of today's blood work with a phone call.   Please start taking Azithromycin - two tablets once a week. Take with food to help with any nausea you may have.   Please make an appointment with Marcie Bal at your convenience to talk.   I have also sent in some Trazodone to help you sleep - try taking 1 or 2 tablets around 9:00/9:30 at night to see if this helps you wind down a little to get some sleep.   Please com back in 2 months.

## 2018-04-11 NOTE — Progress Notes (Signed)
Name: Beverly [name]  DOB: Sep 02, 1980 MRN: [id number] PCP: Colwich Callas, NP   Patient Active Problem List   Diagnosis Date Noted  . Ulceration, vulva 03/10/2018  . Itching 03/10/2018  . 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 Pancytopenia ([place]) 01/09/2016  . Major depression, recurrent ([place]) 03/19/2015  . Homeless 03/19/2015  . 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 Depression 10/17/2013  . Anemia 10/17/2013  . Thrombocytopenia ([place]) 10/17/2013  . Cigarette smoker 10/17/2013  . Asthma 10/17/2013  . Encounter for general adult medical examination without abnormal findings 06/12/2011  . 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 Anxiety and depression 07/17/2008  . AIDS (acquired immune deficiency syndrome) ([place]) 06/28/2008     Brief Narrative:  Beverly [name] is a 37 y.o. woman with HIV and AIDS. CD4 nadir < 10 and has been < or = to 50 since 2014 and likely beyond that; she was originally diagnosed approx 2005 per chart review (she is uncertain). History of OIs: kaposi's sarcoma, recurrent abscesses/skin ulcerations.  Previous Regimens: . Atripla  . Kaletra + Combivir (4th pregnancy) . Darunavir + Ritonavir + Truvada  . Biktarvy 02/2018 >  Genotypes: . wildtype   Subjective:  CC:  [name] is here for follow up on her HIV/AIDS as well as coordinating care for gyn oncology services.   HPI/ROS:  She reports she is doing very well with her medications and taking her Biktarvy and Bactrim every day as prescribed and has not missed a dose. She has no concerns over side effects from these medications but has had an increase in her anxiety and worry; although she has a lot going on. She has a lot of stress at work, home and now with facing possible cancer diagnosis and getting back in care for her HIV/AIDS this is all very overwhelming for her. She has little support at home and her teenage children are starting to ask questions about her health which  makes her more anxious. She is not sleeping much and has had a very hard time going to sleep.    Past Medical History:  Diagnosis Date  . Abscess   . AIDS ([place]) 03/19/2015  . Anxiety   . Asthma   . Blood transfusion without reported diagnosis   . Depression   . HIV infection ([place])   . Homeless 03/19/2015   states stays with family or friends. Does not rent or own a home, but does not live on streets.  . 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 Leg lesion 03/19/2015  . Major depression, recurrent ([place]) 03/19/2015  . 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 Skin ulcer ([place]) 05/06/2015    Outpatient Medications Prior to Visit  Medication Sig Dispense Refill  . bictegravir-emtricitabine-tenofovir AF (BIKTARVY) 50-200-25 MG TABS tablet Take 1 tablet by mouth daily. Try to take at the same time each day with or without food. 30 tablet 5  . hydrOXYzine (ATARAX/VISTARIL) 25 MG tablet Take 1 tablet (25 mg total) by mouth 3 (three) times daily as needed. 30 tablet 0  . ibuprofen (ADVIL,MOTRIN) 600 MG tablet Take 1 tablet (600 mg total) by mouth every 6 (six) hours as needed. 30 tablet 0  . mupirocin ointment (BACTROBAN) 2 % Place 1 application into the nose 2 (two) times daily. (Patient taking differently: Place 1 application into the nose as needed. ) 22 g 2  . sulfamethoxazole-trimethoprim (BACTRIM DS,SEPTRA DS) 800-160 MG tablet Take 1  tablet by mouth daily. 30 tablet 5  . pregabalin (LYRICA) 25 MG capsule Take 1 capsule (25 mg total) by mouth 2 (two) times daily. (Patient not taking: Reported on 04/06/2018) 60 capsule 1   No facility-administered medications prior to visit.      Allergies  Allergen Reactions  . Bee Venom Anaphylaxis, Shortness Of Breath and Swelling  . Morphine And Related Hives and Itching    Just can't take "morphine", vicodin is OK    Objective:   Vitals:   04/11/18 1120  BP: 130/79  Pulse: 96  Temp: 98 F (36.7 C)  TempSrc: Oral  Weight: 166 lb  (75.3 kg)  Height: 5' 1"  (1.549 m)   Body mass index is 31.37 kg/m.  Physical Exam  Constitutional: She is oriented to person, place, and time. She appears distressed (moderate emotional distress - frequently tearful during visit.s).  HENT:  Mouth/Throat: Oropharynx is clear and moist. No oral lesions. No dental abscesses.  Eyes: No scleral icterus.  Cardiovascular: Normal rate, regular rhythm and normal heart sounds.  Pulmonary/Chest: Effort normal and breath sounds normal. No respiratory distress. She has no rales.  Abdominal: Soft. Bowel sounds are normal. She exhibits no distension. There is no tenderness.  Musculoskeletal: Normal range of motion. She exhibits no tenderness.  Lymphadenopathy:    She has no cervical adenopathy.  Neurological: She is alert and oriented to person, place, and time.  Skin: Skin is warm and dry.  Psychiatric: Mood, affect and judgment normal.  Vitals reviewed.   Lab Results Lab Results  Component Value Date   WBC 4.4 04/06/2018   HGB 10.8 (L) 04/06/2018   HCT 34.3 (L) 04/06/2018   MCV 86.2 04/06/2018   PLT 230 04/06/2018    Lab Results  Component Value Date   CREATININE 0.78 04/06/2018   BUN 12 04/06/2018   NA 140 04/06/2018   K 3.9 04/06/2018   CL 109 04/06/2018   CO2 23 04/06/2018    Lab Results  Component Value Date   ALT 15 04/06/2018   AST 22 04/06/2018   ALKPHOS 95 04/06/2018   BILITOT 0.5 04/06/2018    Lab Results  Component Value Date   CHOL 146 06/10/2016   HDL 72 06/10/2016   LDLCALC 63 06/10/2016   TRIG 56 06/10/2016   CHOLHDL 2.0 06/10/2016   HIV 1 RNA Quant (copies/mL)  Date Value  06/10/2016 49 (H)  01/09/2016 338,281 (H)  05/06/2015 341 (H)   CD4 T Cell Abs (/uL)  Date Value  04/06/2018 20 (L)  11/11/2016 51 (L)  06/10/2016 10 (L)     Assessment & Plan:   Problem List Items Addressed This Visit      Unprioritized   AIDS (acquired immune deficiency syndrome) ([place]) - Primary    CD4 count 20. Will  check HIV viral load today after 4 weeks of consistent medications. She did not do initial blood work when we resumed her ART. Regarding to upcoming procedures and low CD4 - I have discussed with Dr. Megan Salon and we both agree that the biopsy should not be postponed considering the emotional/mental distress that would have on her to wait. If her viral load is suppressed, even in the setting of a low CD4 count this makes opportunistic infections much less likely and changes of healing better. I would request that should she require a radical hysterectomy for treatment that we wait until she is virologically suppressed as long as it is reasonable to ensure best change of good  surgical outcome. She has had AIDS and off medications for a very long time and she may take a long time to recover especially in light of possible concurrent malignancy.  I will continue her biktarvy and bactrim and add weekly azithromycin for MAC prophylaxis - she has had no trouble with the first month of resuming therapy and no signs of IRIS. Will have her return in 2 months to check in again. She will continue to work with Karn Pickler and Delories Heinz for [employer] and adherence counseling - I greatly appreciate their efforts to help [place].       Relevant Medications   azithromycin (ZITHROMAX) 600 MG tablet   Other Relevant Orders   HIV-1 RNA quant-no reflex-bld   Anxiety and depression    She has had relapsing depression and anxiety throughout the years documented in the chart. She is tearful today and not sleeping and feels she is very aggressive/irritated at her family/friends when she does not mean to be this way. We discussed the possibility of starting medication (escitalapram vs effexor) of which she is leery of. She was in care with [facility] many years ago but did not find any benefit in zoloft so stopped going. She had a good connection with Marcie Bal and does agree that at minimum counseling services will be helpful for  her. She will consider medications.  Will start trazodone 50-162m QHS PRN to see if this helps with her sleep. 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 She failed gabapentin in the past and specifically requests alterantive. She may be open to starting duloxetine if we cannot get the lyrica approved.       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   I spent 40 minutes with the patient including greater than 80% of time in face to face counsel of the patient regarding the above.   SJanene Madeira MSN, NP-C RSouthwest Health Center Incfor Infectious DAguilarPager: [phone]: [phone] 04/12/18  11:20 PM

## 2018-04-12 ENCOUNTER — Telehealth: Payer: Self-pay | Admitting: Obstetrics and Gynecology

## 2018-04-12 NOTE — Telephone Encounter (Signed)
Called patient to inform her the appointment scheduled on 10/30 with Dr [name] Hoes was not needed. Left a VM.

## 2018-04-12 NOTE — Assessment & Plan Note (Addendum)
CD4 count 20. Will check HIV viral load today after 4 weeks of consistent medications. She did not do initial blood work when we resumed her ART. Regarding to upcoming procedures and low CD4 - I have discussed with Dr. Megan Salon and we both agree that the biopsy should not be postponed considering the emotional/mental distress that would have on her to wait. If her viral load is suppressed, even in the setting of a low CD4 count this makes opportunistic infections much less likely and changes of healing better. I would request that should she require a radical hysterectomy for treatment that we wait until she is virologically suppressed as long as it is reasonable to ensure best change of good surgical outcome. She has had AIDS and off medications for a very long time and she may take a long time to recover especially in light of possible concurrent malignancy.  I will continue her biktarvy and bactrim and add weekly azithromycin for MAC prophylaxis - she has had no trouble with the first month of resuming therapy and no signs of IRIS. Will have her return in 2 months to check in again. She will continue to work with Karn Pickler and Delories Heinz for [employer] and adherence counseling - I greatly appreciate their efforts to help [place].

## 2018-04-12 NOTE — Assessment & Plan Note (Signed)
She has not received her lyrica yet as she is awaiting prior authorization. She failed gabapentin in the past and specifically requests alterantive. She may be open to starting duloxetine if we cannot get the lyrica approved.

## 2018-04-12 NOTE — Assessment & Plan Note (Addendum)
She has had relapsing depression and anxiety throughout the years documented in the chart. She is tearful today and not sleeping and feels she is very aggressive/irritated at her family/friends when she does not mean to be this way. We discussed the possibility of starting medication (escitalapram vs effexor) of which she is leery of. She was in care with [facility] many years ago but did not find any benefit in zoloft so stopped going. She had a good connection with Marcie Bal and does agree that at minimum counseling services will be helpful for her. She will consider medications.  Will start trazodone 50-100mg  QHS PRN to see if this helps with her sleep. Discussed alternatives with melatonin vs restoril

## 2018-04-12 NOTE — Assessment & Plan Note (Signed)
Biopsies scheduled for Thursday - greatly appreciate care and guidance of her GYN-ONC team

## 2018-04-13 ENCOUNTER — Other Ambulatory Visit: Payer: Self-pay

## 2018-04-13 ENCOUNTER — Encounter (HOSPITAL_BASED_OUTPATIENT_CLINIC_OR_DEPARTMENT_OTHER): Payer: Self-pay | Admitting: *Deleted

## 2018-04-13 LAB — HIV-1 RNA QUANT-NO REFLEX-BLD
HIV 1 RNA QUANT: 95 {copies}/mL — AB
HIV-1 RNA QUANT, LOG: 1.98 {Log_copies}/mL — AB

## 2018-04-13 NOTE — Progress Notes (Signed)
Spoke with patient via telephone for pre op interview. NPO after MN. No medications in am. Arrival time 0900. Needs UDS am of surgery. EKG, CBC and CMP on chart and in Epic.

## 2018-04-14 ENCOUNTER — Ambulatory Visit (HOSPITAL_BASED_OUTPATIENT_CLINIC_OR_DEPARTMENT_OTHER): Payer: Medicaid Other | Admitting: Anesthesiology

## 2018-04-14 ENCOUNTER — Encounter (HOSPITAL_BASED_OUTPATIENT_CLINIC_OR_DEPARTMENT_OTHER)
Admission: RE | Disposition: A | Discharge status: Home / Self Care | Payer: Self-pay | Source: Ambulatory Visit | Attending: Obstetrics

## 2018-04-14 ENCOUNTER — Encounter (HOSPITAL_BASED_OUTPATIENT_CLINIC_OR_DEPARTMENT_OTHER): Payer: Self-pay | Admitting: Emergency Medicine

## 2018-04-14 ENCOUNTER — Ambulatory Visit (HOSPITAL_BASED_OUTPATIENT_CLINIC_OR_DEPARTMENT_OTHER)
Admission: RE | Admit: 2018-04-14 | Discharge: 2018-04-14 | Disposition: A | Discharge status: Home / Self Care | Payer: Medicaid Other | Source: Ambulatory Visit | Attending: Obstetrics | Admitting: Obstetrics

## 2018-04-14 ENCOUNTER — Other Ambulatory Visit: Payer: Self-pay

## 2018-04-14 DIAGNOSIS — D069 Carcinoma in situ of cervix, unspecified: Secondary | ICD-10-CM

## 2018-04-14 DIAGNOSIS — N890 Mild vaginal dysplasia: Secondary | ICD-10-CM | POA: Diagnosis not present

## 2018-04-14 DIAGNOSIS — R87614 Cytologic evidence of malignancy on smear of cervix: Secondary | ICD-10-CM

## 2018-04-14 DIAGNOSIS — N879 Dysplasia of cervix uteri, unspecified: Secondary | ICD-10-CM | POA: Diagnosis present

## 2018-04-14 DIAGNOSIS — N871 Moderate cervical dysplasia: Secondary | ICD-10-CM | POA: Diagnosis not present

## 2018-04-14 DIAGNOSIS — F17200 Nicotine dependence, unspecified, uncomplicated: Secondary | ICD-10-CM | POA: Insufficient documentation

## 2018-04-14 DIAGNOSIS — Z21 Asymptomatic human immunodeficiency virus [HIV] infection status: Secondary | ICD-10-CM | POA: Insufficient documentation

## 2018-04-14 DIAGNOSIS — N9089 Other specified noninflammatory disorders of vulva and perineum: Secondary | ICD-10-CM

## 2018-04-14 DIAGNOSIS — N900 Mild vulvar dysplasia: Secondary | ICD-10-CM | POA: Insufficient documentation

## 2018-04-14 HISTORY — PX: VULVA /PERINEUM BIOPSY: SHX319

## 2018-04-14 HISTORY — PX: CERVICAL CONIZATION W/BX: SHX1330

## 2018-04-14 HISTORY — PX: DILATION AND CURETTAGE OF UTERUS: SHX78

## 2018-04-14 LAB — RAPID URINE DRUG SCREEN, HOSP PERFORMED
AMPHETAMINES: POSITIVE — AB
BENZODIAZEPINES: NOT DETECTED
Barbiturates: NOT DETECTED
Cocaine: NOT DETECTED
Opiates: NOT DETECTED
TETRAHYDROCANNABINOL: POSITIVE — AB

## 2018-04-14 LAB — PREGNANCY, URINE: Preg Test, Ur: NEGATIVE

## 2018-04-14 LAB — POCT PREGNANCY, URINE: Preg Test, Ur: NEGATIVE

## 2018-04-14 SURGERY — CONE BIOPSY, CERVIX
Anesthesia: General | Site: Vulva

## 2018-04-14 MED ORDER — IODINE STRONG (LUGOLS) 5 % PO SOLN
ORAL | Status: DC | PRN
  Administered 2018-04-14: 14 mL via ORAL

## 2018-04-14 MED ORDER — CEFAZOLIN SODIUM-DEXTROSE 2-4 GM/100ML-% IV SOLN
2.0000 g | Freq: Once | INTRAVENOUS | Status: AC
  Administered 2018-04-14: 2 g via INTRAVENOUS
  Filled 2018-04-14: qty 100

## 2018-04-14 MED ORDER — FENTANYL CITRATE (PF) 100 MCG/2ML IJ SOLN
INTRAMUSCULAR | Status: AC
  Filled 2018-04-14: qty 2

## 2018-04-14 MED ORDER — LIDOCAINE 2% (20 MG/ML) 5 ML SYRINGE
INTRAMUSCULAR | Status: AC
  Filled 2018-04-14: qty 5

## 2018-04-14 MED ORDER — CEFAZOLIN SODIUM-DEXTROSE 2-4 GM/100ML-% IV SOLN
INTRAVENOUS | Status: AC
  Filled 2018-04-14: qty 100

## 2018-04-14 MED ORDER — FENTANYL CITRATE (PF) 100 MCG/2ML IJ SOLN
INTRAMUSCULAR | Status: DC | PRN
  Administered 2018-04-14 (×4): 25 ug via INTRAVENOUS
  Administered 2018-04-14: 50 ug via INTRAVENOUS

## 2018-04-14 MED ORDER — LIDOCAINE 2% (20 MG/ML) 5 ML SYRINGE
INTRAMUSCULAR | Status: DC | PRN
  Administered 2018-04-14: 60 mg via INTRAVENOUS

## 2018-04-14 MED ORDER — MIDAZOLAM HCL 2 MG/2ML IJ SOLN
INTRAMUSCULAR | Status: AC
  Filled 2018-04-14: qty 2

## 2018-04-14 MED ORDER — MIDAZOLAM HCL 2 MG/2ML IJ SOLN
INTRAMUSCULAR | Status: DC | PRN
  Administered 2018-04-14: 2 mg via INTRAVENOUS

## 2018-04-14 MED ORDER — ONDANSETRON HCL 4 MG/2ML IJ SOLN
INTRAMUSCULAR | Status: DC | PRN
  Administered 2018-04-14: 4 mg via INTRAVENOUS

## 2018-04-14 MED ORDER — PROPOFOL 10 MG/ML IV BOLUS
INTRAVENOUS | Status: DC | PRN
  Administered 2018-04-14: 150 mg via INTRAVENOUS

## 2018-04-14 MED ORDER — LIDOCAINE-EPINEPHRINE 1 %-1:100000 IJ SOLN
INTRAMUSCULAR | Status: DC | PRN
  Administered 2018-04-14: 10 mL

## 2018-04-14 MED ORDER — PROPOFOL 10 MG/ML IV BOLUS
INTRAVENOUS | Status: AC
  Filled 2018-04-14: qty 20

## 2018-04-14 MED ORDER — HEMOSTATIC AGENTS (NO CHARGE) OPTIME
TOPICAL | Status: DC | PRN
  Administered 2018-04-14: 1 via TOPICAL

## 2018-04-14 MED ORDER — DEXAMETHASONE SODIUM PHOSPHATE 10 MG/ML IJ SOLN
INTRAMUSCULAR | Status: DC | PRN
  Administered 2018-04-14: 10 mg via INTRAVENOUS

## 2018-04-14 MED ORDER — ACETIC ACID 5 % SOLN
Status: DC | PRN
  Administered 2018-04-14: 1 via TOPICAL

## 2018-04-14 MED ORDER — HYDROCODONE-ACETAMINOPHEN 5-325 MG PO TABS: 1.0000 | ORAL_TABLET | Freq: Four times a day (QID) | ORAL | 0 refills | Status: DC | PRN

## 2018-04-14 MED ORDER — LACTATED RINGERS IV SOLN
INTRAVENOUS | Status: DC
  Administered 2018-04-14: 10:00:00 via INTRAVENOUS
  Filled 2018-04-14: qty 1000

## 2018-04-14 MED ORDER — MONSELS FERRIC SUBSULFATE EX SOLN
CUTANEOUS | Status: DC | PRN
  Administered 2018-04-14: 1 via TOPICAL

## 2018-04-14 MED FILL — Ferric Subsulfate (Bulk) Soln: Qty: 59 | Status: AC

## 2018-04-14 SURGICAL SUPPLY — 49 items
BLADE CLIPPER SURG (BLADE) IMPLANT
BLADE EXTENDED COATED 6.5IN (ELECTRODE) IMPLANT
BLADE SURG 11 STRL SS (BLADE) ×4 IMPLANT
BLADE SURG 15 STRL LF DISP TIS (BLADE) ×2 IMPLANT
BLADE SURG 15 STRL SS (BLADE) ×4
CANISTER SUCT 3000ML PPV (MISCELLANEOUS) ×4 IMPLANT
CATH ROBINSON RED A/P 16FR (CATHETERS) ×4 IMPLANT
DILATOR CANAL MILEX (MISCELLANEOUS) ×2 IMPLANT
DRSG TELFA 3X8 NADH (GAUZE/BANDAGES/DRESSINGS) ×4 IMPLANT
ELECT BALL LEEP 3MM BLK (ELECTRODE) ×4 IMPLANT
ELECT BALL LEEP 5MM RED (ELECTRODE) ×2 IMPLANT
GAUZE 4X4 16PLY RFD (DISPOSABLE) ×4 IMPLANT
GLOVE BIO SURGEON STRL SZ 6 (GLOVE) ×8 IMPLANT
GLOVE BIO SURGEON STRL SZ 6.5 (GLOVE) ×7 IMPLANT
GLOVE BIO SURGEONS STRL SZ 6.5 (GLOVE) ×3
GLOVE BIOGEL PI IND STRL 6.5 (GLOVE) IMPLANT
GLOVE BIOGEL PI IND STRL 7.0 (GLOVE) ×2 IMPLANT
GLOVE BIOGEL PI IND STRL 7.5 (GLOVE) IMPLANT
GLOVE BIOGEL PI INDICATOR 6.5 (GLOVE) ×2
GLOVE BIOGEL PI INDICATOR 7.0 (GLOVE) ×2
GLOVE BIOGEL PI INDICATOR 7.5 (GLOVE) ×2
GOWN STRL REUS W/ TWL LRG LVL3 (GOWN DISPOSABLE) ×2 IMPLANT
GOWN STRL REUS W/TWL LRG LVL3 (GOWN DISPOSABLE) ×10 IMPLANT
GOWN STRL REUS W/TWL LRG LVL4 (GOWN DISPOSABLE) ×4 IMPLANT
HEMOSTAT SURGICEL 4X8 (HEMOSTASIS) ×6 IMPLANT
KIT TURNOVER CYSTO (KITS) ×4 IMPLANT
NDL HYPO 25X1 1.5 SAFETY (NEEDLE) ×2 IMPLANT
NEEDLE HYPO 22GX1.5 SAFETY (NEEDLE) ×2 IMPLANT
NEEDLE HYPO 25X1 1.5 SAFETY (NEEDLE) IMPLANT
NS IRRIG 500ML POUR BTL (IV SOLUTION) ×4 IMPLANT
PACK PERINEAL COLD (PAD) ×2 IMPLANT
PACK VAGINAL MINOR WOMEN LF (CUSTOM PROCEDURE TRAY) ×4 IMPLANT
PACK VAGINAL WOMENS (CUSTOM PROCEDURE TRAY) ×4 IMPLANT
PAD DRESSING TELFA 3X8 NADH (GAUZE/BANDAGES/DRESSINGS) ×2 IMPLANT
PAD OB MATERNITY 4.3X12.25 (PERSONAL CARE ITEMS) ×4 IMPLANT
PUNCH BIOPSY DERMAL 4MM (INSTRUMENTS) ×2 IMPLANT
SCOPETTES 8  STERILE (MISCELLANEOUS) ×2
SCOPETTES 8 STERILE (MISCELLANEOUS) ×2 IMPLANT
SUT VIC AB 0 CT1 36 (SUTURE) ×4 IMPLANT
SUT VIC AB 2-0 SH 27 (SUTURE)
SUT VIC AB 2-0 SH 27XBRD (SUTURE) IMPLANT
SUT VIC AB 2-0 UR6 27 (SUTURE) IMPLANT
SUT VIC AB 3-0 SH 27 (SUTURE)
SUT VIC AB 3-0 SH 27X BRD (SUTURE) IMPLANT
SUT VICRYL 0 UR6 27IN ABS (SUTURE) ×8 IMPLANT
SYR BULB IRRIGATION 50ML (SYRINGE) ×4 IMPLANT
TOWEL OR 17X24 6PK STRL BLUE (TOWEL DISPOSABLE) ×8 IMPLANT
VACUUM HOSE/TUBING 7/8INX6FT (MISCELLANEOUS) IMPLANT
WATER STERILE IRR 500ML POUR (IV SOLUTION) ×4 IMPLANT

## 2018-04-14 NOTE — Op Note (Signed)
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 Right vaginal sidewall (ie 9:00) with decreased uptake down to mid-vagina. This area was separately biopsied about 1-2 cm distal to fornix at 9:00 and sent as vaginal biopsy. Vulva on left (3:00) notable for linear indurated hypopigmented lesion which is crusted and plaque-like. Biopsies of 2 areas were taken here.  Estimated Blood Loss:  less than 50 mL       Specimens: cervical cone tagged at 12:00, endocervical curettage, Vaginal biopsy 9:00, Vulvar biopsy x 2 (2:00 and 8:67)         Complications:  None; patient tolerated the procedure well.         Disposition: PACU - hemodynamically stable.  Procedure Details: The patient was taken to the operating room and placed in the supine position with SCD hose on. General anesthesia was then induced without difficulty. She was then placed in the dorsolithotomy position. The perineum was prepped with Betadine. The vagina was prepped gently by me with Betadine. The patient was then draped after the prep was dried. An in and out catheterization to empty the bladder was performed under sterile conditions.  Timeout was performed.  The weighted speculum was placed in the posterior vagina. The right angle retractor was placed anteriorally to visualize the cervix. A 0-vicryl suture on a UR-6 needle was used to place stay sutures (which were tagged) at 3 and 9 o'clock of the cervicovaginal junction. 1% lidocaine with epinephrine was infiltrated at 2, 4, 8, 10 o'clock circumferentially around the face of the cervix, and deep in the  endocervical canal. An 11 blade scalpel on a long knife handle was used to make an incision around the face of the cervix, inside of the stay sutures, circumferentially. An allis clamp was used to grasp the specimen in order to manipulate it. The incision was then continued with Mayo scissors by angling towards the endocervix to amputate the specimen. It was removed, oriented with a marking stitch at the 12 o'clock ectocervix and sent to pathology.   A post-cone ECC was collected from the endocervical canal using a Box curette.  The bovie with the 46mm ball attachment was used to create hemostasis at the surgical bed. A Monsels soaked Surgicell was placed into the surgical bed for hemostasis. The stay sutures were reapproximated in the midline.  The vaginal wall along the 9:00 region extending distally to halfway point had decreased Lugol's uptake from the start of the procedure. Given the highly abnormal Pap and no obvious decreased uptake on the cervix I thought I should go ahead and biopsy the vaginal wall. This was performed with a Tischler biopsy forcep. Hemostasis obtained with the bovie.  After injection of lidocaine, two vulvar biopsies of the left vulvar lesion were performed using a 86mm punch biopsy.  All instrument, suture, laparotomy, Ray-Tec, and needle counts were correct x2. The patient tolerated the procedure well and was taken recovery room in stable condition.

## 2018-04-14 NOTE — Anesthesia Preprocedure Evaluation (Signed)
Anesthesia Evaluation  Patient identified by MRN, date of birth, ID band Patient awake    Reviewed: Allergy & Precautions, NPO status , [name] Chart, lab work & pertinent test results  Airway Mallampati: II  TM Distance: >3 FB Neck ROM: Full    Dental  (+) Dental Advisory Given   Pulmonary asthma , Current Smoker,    breath sounds clear to auscultation       Cardiovascular negative cardio ROS   Rhythm:Regular Rate:Normal     Neuro/Psych negative neurological ROS     GI/Hepatic negative GI ROS, Neg liver ROS,   Endo/Other  negative endocrine ROS  Renal/GU negative Renal ROS     Musculoskeletal   Abdominal   Peds  Hematology  (+) HIV,   Anesthesia Other Findings   Reproductive/Obstetrics                             Anesthesia Physical Anesthesia Plan  ASA: II  Anesthesia Plan: General   Post-op Pain Management:    Induction: Intravenous  PONV Risk Score and Plan: 2 and Dexamethasone, Ondansetron and Treatment may vary due to age or medical condition  Airway Management Planned: LMA  Additional Equipment:   Intra-op Plan:   Post-operative Plan: Extubation in OR  Informed Consent: I have reviewed the patients History and Physical, chart, labs and discussed the procedure including the risks, benefits and alternatives for the proposed anesthesia with the patient or authorized representative who has indicated his/her understanding and acceptance.   Dental advisory given  Plan Discussed with: CRNA  Anesthesia Plan Comments:         Anesthesia Quick Evaluation

## 2018-04-14 NOTE — Interval H&P Note (Signed)
History and Physical Interval Note:  04/14/2018 11:14 AM  Beverly [name]  has presented today for surgery, with the diagnosis of CERVICAL CANCER ON PAP SMEAR, VULVAR LESION  The various methods of treatment have been discussed with the patient and family. After consideration of risks, benefits and other options for treatment, the patient has consented to  Procedure(s): COLD KNIFE CONIZATION CERVIX WITH BIOPSY (N/A) ENDOCERVICAL CURETTAGE (N/A) VULVAR BIOPSY (N/A) as a surgical intervention .  The patient's history has been reviewed, patient examined, no change in status, stable for surgery.  I have reviewed the patient's chart and labs.  Questions were answered to the patient's satisfaction.     Isabel Caprice

## 2018-04-14 NOTE — Anesthesia Procedure Notes (Signed)
Procedure Name: LMA Insertion Date/Time: 04/14/2018 11:26 AM Performed by: Suan Halter, CRNA Pre-anesthesia Checklist: Patient identified, Emergency Drugs available, Suction available and Patient being monitored Patient Re-evaluated:Patient Re-evaluated prior to induction Oxygen Delivery Method: Circle system utilized Preoxygenation: Pre-oxygenation with 100% oxygen Induction Type: IV induction Ventilation: Mask ventilation without difficulty LMA: LMA inserted LMA Size: 4.0 Number of attempts: 1 Airway Equipment and Method: Bite block Placement Confirmation: positive ETCO2 Tube secured with: Tape Dental Injury: Teeth and Oropharynx as per pre-operative assessment

## 2018-04-14 NOTE — Discharge Instructions (Signed)
Cervical Conization, Care After This sheet gives you information about how to care for yourself after your procedure. Your health care provider may also give you more specific instructions. If you have problems or questions, contact your health care provider. What can I expect after the procedure? After the procedure, it is common to have:  A groggy feeling, if you were given medicine to make you fall asleep (general anesthetic).  Cramps that feel similar to menstrual cramps.  Bloody discharge or light to moderate bleeding.  Dark discharge. This discharge may look similar to coffee grounds. This is from the paste that was applied to the cervix to control bleeding.  Follow these instructions at home: Medicines  Take over-the-counter and prescription medicines only as told by your health care provider.  Do not take aspirin until your health care provider says it is okay. Aspirin can cause bleeding.  If you are taking pain medicine: ? You may need to prevent or treat constipation. To do this, your health care provider may recommend that you:  Drink enough fluid to keep your urine clear or pale yellow.  Take over-the-counter or prescription medicines.  Eat foods that are high in fiber, such as fresh fruits and vegetables, whole grains, bran, and beans.  Limit foods that are high in fat and processed sugars, such as fried and sweet foods. ? Do not drive or use heavy machinery. General instructions  You may resume your normal diet unless your health care provider advises you not to do so.  Take showers for the first week. Do not take baths, swim, or use hot tubs until your health care provider says it is okay.  Do not douche, use tampons, or have sex until your health care provider says it is okay.  Avoid activities that require great effort, such as exercises and heavy lifting, for at least 7-14 days.  Keep all follow-up visits as told by your health care provider. This is  important. Contact a health care provider if:  You develop a rash.  You are dizzy or lightheaded.  You feel nauseous or you vomit.  You develop a bad smelling discharge from your vagina. Get help right away if:  You have blood clots or bleeding that is heavier than a normal period. Bleeding that soaks a pad in less than 1 hour is considered heavy bleeding.  You have a fever.  You have increasing cramps.  You faint.  You have pain when you urinate.  You have severe or worsening pain.  Your pain is not relieved when you take medicine.  You have bloody urine.  You vomit. Summary  After the procedure, it is common to have cramps and dark or bloody discharge from your vagina.  Do not douche, use tampons, or have sex until your health care provider says it is okay.  Follow all other activity restrictions as told by your health care provider. This information is not intended to replace advice given to you by your health care provider. Make sure you discuss any questions you have with your health care provider. Document Released: 06/08/2005 Document Revised: 06/10/2016 Document Reviewed: 06/10/2016 Elsevier Interactive Patient Education  2017 [place] Instructions  Activity: Get plenty of rest for the remainder of the day. A responsible individual must stay with you for 24 hours following the procedure.  For the next 24 hours, DO NOT: -Drive a car -Paediatric nurse -Drink alcoholic beverages -Take any medication unless instructed by your physician -  Make any legal decisions or sign important papers.  Meals: Start with liquid foods such as gelatin or soup. Progress to regular foods as tolerated. Avoid greasy, spicy, heavy foods. If nausea and/or vomiting occur, drink only clear liquids until the nausea and/or vomiting subsides. Call your physician if vomiting continues.  Special Instructions/Symptoms: Your throat may feel dry or sore from  the anesthesia or the breathing tube placed in your throat during surgery. If this causes discomfort, gargle with warm salt water. The discomfort should disappear within 24 hours.  If you had a scopolamine patch placed behind your ear for the management of post- operative nausea and/or vomiting:  1. The medication in the patch is effective for 72 hours, after which it should be removed.  Wrap patch in a tissue and discard in the trash. Wash hands thoroughly with soap and water. 2. You may remove the patch earlier than 72 hours if you experience unpleasant side effects which may include dry mouth, dizziness or visual disturbances. 3. Avoid touching the patch. Wash your hands with soap and water after contact with the patch.

## 2018-04-14 NOTE — Transfer of Care (Signed)
Immediate Anesthesia Transfer of Care Note  Patient: Beverly [name]  Procedure(s) Performed: Procedure(s) (LRB): COLD KNIFE CONIZATION CERVIX WITH BIOPSY (N/A) ENDOCERVICAL CURETTAGE (N/A) VULVAR BIOPSIES (N/A)  Patient Location: PACU  Anesthesia Type: General  Level of Consciousness: awake, oriented, sedated and patient cooperative  Airway & Oxygen Therapy: Patient Spontanous Breathing and Patient connected to face mask oxygen  Post-op Assessment: Report given to PACU RN and Post -op Vital signs reviewed and stable  Post vital signs: Reviewed and stable  Complications: No apparent anesthesia complications  Last Vitals:  Vitals Value Taken Time  BP 104/63 04/14/2018 12:17 PM  Temp    Pulse 88 04/14/2018 12:21 PM  Resp 19 04/14/2018 12:21 PM  SpO2 100 % 04/14/2018 12:21 PM  Vitals shown include unvalidated device data.  Last Pain:  Vitals:   04/14/18 0851  TempSrc: Oral  PainSc: 6

## 2018-04-15 ENCOUNTER — Encounter (HOSPITAL_BASED_OUTPATIENT_CLINIC_OR_DEPARTMENT_OTHER): Payer: Self-pay | Admitting: Obstetrics

## 2018-04-15 NOTE — Anesthesia Postprocedure Evaluation (Signed)
Anesthesia Post Note  Patient: MYREL RAPPLEYE  Procedure(s) Performed: COLD KNIFE CONIZATION CERVIX WITH BIOPSY (N/A Cervix) ENDOCERVICAL CURETTAGE (N/A Cervix) VULVAR BIOPSIES (N/A Vulva)     Patient location during evaluation: PACU Anesthesia Type: General Level of consciousness: awake and alert Pain management: pain level controlled Vital Signs Assessment: post-procedure vital signs reviewed and stable Respiratory status: spontaneous breathing, nonlabored ventilation, respiratory function stable and patient connected to nasal cannula oxygen Cardiovascular status: blood pressure returned to baseline and stable Postop Assessment: no apparent nausea or vomiting Anesthetic complications: no    Last Vitals:  Vitals:   04/14/18 1300 04/14/18 1315  BP: (!) 118/92 117/71  Pulse: 62 77  Resp: 20 18  Temp:    SpO2: 100% 98%    Last Pain:  Vitals:   04/15/18 1048  TempSrc:   PainSc: 6                  Tiajuana Amass

## 2018-04-18 ENCOUNTER — Telehealth: Payer: Self-pay

## 2018-04-18 NOTE — Telephone Encounter (Signed)
Outgoing call to patient per Joylene John NP regarding recent surgical path report " how is she , path showed pre-cancer but no cancer - Dr Gerarda Fraction will discuss next steps at post op check"  No answer- left VM to call our office.

## 2018-04-19 ENCOUNTER — Telehealth: Payer: Self-pay

## 2018-04-19 NOTE — Telephone Encounter (Addendum)
Outgoing call to attempt to reach patient today. See my note from yesterday.  No answer, left her VM with our contact info.   Incoming call from pt.  I let her know per Joylene John NP:  "how is she? Path showed pre-cancer, no cancer.  Dr Gerarda Fraction will discuss next steps at post op check" Pt voiced understanding and has f/u appt on 04-29-2018 at 1:45 pm.  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  No other needs per pt - reminded her to call for any worsening symptoms. Pt voiced understanding.

## 2018-04-20 ENCOUNTER — Ambulatory Visit: Payer: Medicaid Other | Admitting: Obstetrics and Gynecology

## 2018-04-21 ENCOUNTER — Ambulatory Visit: Payer: Medicaid Other

## 2018-04-28 ENCOUNTER — Ambulatory Visit: Payer: Self-pay

## 2018-04-29 ENCOUNTER — Inpatient Hospital Stay: Payer: Medicaid Other | Attending: Obstetrics | Admitting: Obstetrics

## 2018-04-29 ENCOUNTER — Inpatient Hospital Stay: Payer: Medicaid Other

## 2018-04-29 ENCOUNTER — Encounter: Payer: Self-pay | Admitting: Obstetrics

## 2018-04-29 VITALS — BP 122/83 | HR 83 | Temp 98.0°F | Resp 20 | Ht 61.0 in | Wt 160.4 lb

## 2018-04-29 DIAGNOSIS — R300 Dysuria: Secondary | ICD-10-CM | POA: Diagnosis not present

## 2018-04-29 DIAGNOSIS — D069 Carcinoma in situ of cervix, unspecified: Secondary | ICD-10-CM | POA: Diagnosis present

## 2018-04-29 DIAGNOSIS — D099 Carcinoma in situ, unspecified: Secondary | ICD-10-CM

## 2018-04-29 DIAGNOSIS — R350 Frequency of micturition: Secondary | ICD-10-CM

## 2018-04-29 LAB — URINALYSIS, COMPLETE (UACMP) WITH MICROSCOPIC
Bilirubin Urine: NEGATIVE
GLUCOSE, UA: NEGATIVE mg/dL
Ketones, ur: NEGATIVE mg/dL
NITRITE: NEGATIVE
PH: 6 (ref 5.0–8.0)
Protein, ur: NEGATIVE mg/dL
SPECIFIC GRAVITY, URINE: 1.021 (ref 1.005–1.030)

## 2018-04-29 NOTE — Progress Notes (Signed)
S: some black discharge. Worse when she stands at work. No bright red blood. Pathology: Cervix, cone, 12 o'clock - HIGH GRADE SQUAMOUS INTRAEPITHELIAL LESION (CIN-II-III), WITH EXTENSIVE INVOLVEMENT OF ENDOCERVICAL GLANDS; SEE COMMENT. 2. Endocervix, curettage - HIGH GRADE SQUAMOUS INTRAEPITHELIAL LESION (CIN-II-III), WITH INVOLVEMENT OF ENDOCERVICAL GLANDS. 3. Vagina, biopsy, 9 o'clock - LOW GRADE SQUAMOUS INTRAEPITHELIAL LESION (VAIN-I). 4. 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 - NO DYSPLASIA IDENTIFIED. 5. Vulva, biopsy, 4 o'clock - LOW GRADE SQUAMOUS INTRAEPITHELIAL LESION (VIN-I).  O:  Vitals:   04/29/18 1355  BP: 122/83  Pulse: 83  Resp: 20  Temp: 98 F (36.7 C)  SpO2: 98%    General :  Well developed, 37 y.o., female in no apparent distress HEENT:  Normocephalic/atraumatic, symmetric, EOMI, eyelids normal Neck:   No visible masses.  Respiratory:  Respirations unlabored, no use of accessory muscles CV:   Deferred Breast:  Deferred Musculoskeletal: Normal muscle strength. Abdomen:  No visible masses or protrusion Extremities:  No visible edema or deformities Skin:   Normal inspection Neuro/Psych:  No focal motor deficit, no abnormal mental status. Normal gait. Normal affect. 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  I explained the importance of close followup and compliance. I explained although this is preinvasive disease it can progress to cancer without proper followup

## 2018-04-29 NOTE — Patient Instructions (Signed)
1. Return to see Dr. Gerarda Fraction for a vaginal exam in one month 2. Nothing in the vagina until you come back to see me

## 2018-05-01 LAB — URINE CULTURE

## 2018-05-02 ENCOUNTER — Telehealth: Payer: Self-pay

## 2018-05-02 NOTE — Telephone Encounter (Signed)
LM for patient to call back to discuss the results of the urine culture from 04-29-18.

## 2018-05-04 ENCOUNTER — Telehealth: Payer: Self-pay | Admitting: Infectious Diseases

## 2018-05-04 DIAGNOSIS — D069 Carcinoma in situ of cervix, unspecified: Secondary | ICD-10-CM

## 2018-05-04 MED ORDER — DULOXETINE HCL 30 MG PO CPEP: ORAL_CAPSULE | ORAL | 1 refills | Status: DC

## 2018-05-04 NOTE — Telephone Encounter (Signed)
Pregabalin denied from [facility] citing that she needs to fail 2 of the preferred options to treat neuropathic pain. She was intolerant to gabapentin.   Please call Tomorrow and let her know I want to have her start taking Cymbalta every day for her burning feet pain. She is sensitive to medications so would have her start out taking 30 mg (1 capsule ) daily for 7 days then increase to 2 capsules daily (60 mg) until I see her again in January. I saw the good news of her cervical biopsy also and am relieved for her that she does not have cancer. We still need to her to go to [facility] however to deal with the pre-cancerous lesions she has. She should expect a call from their clinic.   Janene Madeira, MSN, NP-C [facility] [email or web address] Pager: [phone] Office: [phone]

## 2018-05-05 ENCOUNTER — Telehealth: Payer: Self-pay

## 2018-05-05 ENCOUNTER — Ambulatory Visit: Payer: Medicaid Other

## 2018-05-05 MED ORDER — NITROFURANTOIN MONOHYD MACRO 100 MG PO CAPS: 100.0000 mg | ORAL_CAPSULE | Freq: Two times a day (BID) | ORAL | 0 refills | Status: DC

## 2018-05-05 NOTE — Telephone Encounter (Signed)
Discussed with pt that she has a UTI and Beverly John, NP is ordering Macrobid 100 mg bid for 5 days. Will send prescription to [facility] pharmacy on [address] as pt requested. Pt needs to increase fluid intake to 8 oz of water every 90 minutes. Pt verbalized understanding.

## 2018-05-05 NOTE — Telephone Encounter (Signed)
LM stating that the urine culture from 04-29-18 showed that she had a UTI.  Please call back to let Dr. Gerarda Fraction' nurse know what pharmacy an antibiotic can be sent.

## 2018-05-05 NOTE — Telephone Encounter (Signed)
Thank you :)

## 2018-05-05 NOTE — Telephone Encounter (Signed)
Called [facility], verified identity.  Informed her per Janene Madeira that Medicaid denied Pregabalin and she would need failure of two preferred options to treat her neuropathic pain.  Colletta Maryland would like for her to start taking 30 mg (1 capsule) daily for 7 days then increase to 60mg  (2 capsules daily until she has her follow up visit with Colletta Maryland in January. Also mentioned Colletta Maryland was aware of Cervical biopsy which came back non cancerous.  Ley her know that Colletta Maryland still would like for her to still go to the [facility] hospital clinic to del with the pre cancerous lesions.  Let her know she should be getting a call from the women's clinic soon. Lilliona verbalized understanding and verified the pharmacy her medication was sent to which was [facility] on [address]. Pricilla Riffle RN

## 2018-05-05 NOTE — Telephone Encounter (Signed)
ENCOUNTER OPENED IN ERROR

## 2018-05-07 ENCOUNTER — Telehealth: Payer: Self-pay | Admitting: *Deleted

## 2018-05-23 ENCOUNTER — Inpatient Hospital Stay: Payer: Medicaid Other | Attending: Obstetrics

## 2018-05-23 ENCOUNTER — Inpatient Hospital Stay (HOSPITAL_BASED_OUTPATIENT_CLINIC_OR_DEPARTMENT_OTHER): Payer: Medicaid Other | Admitting: Obstetrics

## 2018-05-23 ENCOUNTER — Telehealth: Payer: Self-pay | Admitting: *Deleted

## 2018-05-23 ENCOUNTER — Encounter: Payer: Self-pay | Admitting: Obstetrics

## 2018-05-23 VITALS — BP 100/82 | HR 80 | Temp 99.6°F | Resp 20 | Ht 61.0 in | Wt 159.9 lb

## 2018-05-23 DIAGNOSIS — N390 Urinary tract infection, site not specified: Secondary | ICD-10-CM | POA: Diagnosis not present

## 2018-05-23 DIAGNOSIS — R300 Dysuria: Secondary | ICD-10-CM

## 2018-05-23 DIAGNOSIS — D069 Carcinoma in situ of cervix, unspecified: Secondary | ICD-10-CM | POA: Diagnosis present

## 2018-05-23 DIAGNOSIS — D099 Carcinoma in situ, unspecified: Secondary | ICD-10-CM

## 2018-05-23 LAB — URINALYSIS, COMPLETE (UACMP) WITH MICROSCOPIC
Bilirubin Urine: NEGATIVE
GLUCOSE, UA: NEGATIVE mg/dL
Ketones, ur: NEGATIVE mg/dL
NITRITE: POSITIVE — AB
PH: 6.5 (ref 5.0–8.0)
PROTEIN: 30 mg/dL — AB
Specific Gravity, Urine: 1.015 (ref 1.005–1.030)

## 2018-05-23 NOTE — Telephone Encounter (Signed)
I sent a note earlier to triage asking someone to please call her to see if we could get her in to be seen tomorrow or the following day by any provider available to ensure she is doing ok. She is about 6 weeks in or so on her Biktarvy after many many years off meds. I would like to check AFB blood cultures with the fever and diarrhea. Can also get her a doctors note for work and some antiemetics too.  Thank you

## 2018-05-23 NOTE — Progress Notes (Signed)
Please call Beverly [name] to schedule an appointment with any provider tomorrow or Wednesday if possible for evaluation of fevers/symptoms. She is about 6 weeks into her Biktarvy after years w/o medications.   Thank you, Dr. Gerarda Fraction for forwarding information.

## 2018-05-23 NOTE — Patient Instructions (Addendum)
Followup with your providers for continued care. They will re-refer you as needed. You need to have a Pap smear in no more than 6 months from your surgery

## 2018-05-23 NOTE — Progress Notes (Signed)
S: bleeding stopped then she had her period. No BRB at this point. Notes a fever and N/V yesterday x 1.  States completed antibiotics for UTI we gave her  Has a mild cough and sputum. Some back pain. Vomited yesterday 3 times. Has not had much po intake since.  Feels achy. States got flu shot this year.   S/p [facility] 04/14/18 Pathology: Cervix, cone, 12 o'clock - HIGH GRADE SQUAMOUS INTRAEPITHELIAL LESION (CIN-II-III), WITH EXTENSIVE INVOLVEMENT OF ENDOCERVICAL GLANDS; SEE COMMENT. 2. Endocervix, curettage - HIGH GRADE SQUAMOUS INTRAEPITHELIAL LESION (CIN-II-III), WITH INVOLVEMENT OF ENDOCERVICAL GLANDS. 3. Vagina, biopsy, 9 o'clock - LOW GRADE SQUAMOUS INTRAEPITHELIAL LESION (VAIN-I). 4. 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 - NO DYSPLASIA IDENTIFIED. 5. Vulva, biopsy, 4 o'clock - LOW GRADE SQUAMOUS INTRAEPITHELIAL LESION (VIN-I).  O:  Vitals:   05/23/18 1351 05/23/18 1407  BP: (!) 117/104 100/82  Pulse: (!) 112 80  Resp: 20   Temp: 99.6 F (37.6 C)   SpO2: 100%     Genito Urinary: Vulva: Left crural fold with improved induration Normal external female genitalia.  Bladder/urethra: Urethral meatus normal in size and location. No lesions or   masses, well supported bladder Speculum exam: Vagina: No lesion, no discharge, no bleeding. Cervix: postcone, fairly normal appearing, no lesions. The left ectocervical wall is somewhat flush with vagina, but overall good amount of cervix remaining. Bimanual exam:  Uterus: Normal size, mobile.  Adnexal region: No masses., non tender, no CMT.  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 I discussed with the patient she is immunocompromised and should be sure to followup with them tomorrow, if not by visit, then by phone call, given these fevers. I do not see a GYN source for the fever.

## 2018-05-23 NOTE — Telephone Encounter (Signed)
[facility]necology Oncology called to see if patient could be seen/follwo up at [facility] due to fever (99.6) and nausea/vomiting. RN reached out to patient. She states her fever last night was 102.3. She states she had 1 episode of vomiting yesterday after eating crackers/drinking sprite. She has been drinking fluids ok today, has been taking tylenol per the bottle for fever.  She states she has body aches, chills.   Please advise. Landis Gandy, RN

## 2018-05-24 ENCOUNTER — Observation Stay (HOSPITAL_COMMUNITY)
Admission: EM | Admit: 2018-05-24 | Discharge: 2018-05-26 | Disposition: A | Discharge status: Home / Self Care | Payer: Medicaid Other | Attending: Internal Medicine | Admitting: Internal Medicine

## 2018-05-24 ENCOUNTER — Encounter (HOSPITAL_COMMUNITY): Payer: Self-pay | Admitting: Emergency Medicine

## 2018-05-24 ENCOUNTER — Telehealth: Payer: Self-pay | Admitting: Obstetrics

## 2018-05-24 ENCOUNTER — Other Ambulatory Visit: Payer: Self-pay

## 2018-05-24 ENCOUNTER — Emergency Department (HOSPITAL_COMMUNITY): Payer: Medicaid Other

## 2018-05-24 DIAGNOSIS — N12 Tubulo-interstitial nephritis, not specified as acute or chronic: Secondary | ICD-10-CM | POA: Diagnosis present

## 2018-05-24 DIAGNOSIS — B20 Human immunodeficiency virus [HIV] disease: Secondary | ICD-10-CM | POA: Diagnosis not present

## 2018-05-24 DIAGNOSIS — F17210 Nicotine dependence, cigarettes, uncomplicated: Secondary | ICD-10-CM | POA: Insufficient documentation

## 2018-05-24 DIAGNOSIS — J45909 Unspecified asthma, uncomplicated: Secondary | ICD-10-CM | POA: Diagnosis not present

## 2018-05-24 DIAGNOSIS — R112 Nausea with vomiting, unspecified: Secondary | ICD-10-CM | POA: Diagnosis present

## 2018-05-24 DIAGNOSIS — R1030 Lower abdominal pain, unspecified: Secondary | ICD-10-CM | POA: Diagnosis not present

## 2018-05-24 DIAGNOSIS — Z79899 Other long term (current) drug therapy: Secondary | ICD-10-CM | POA: Insufficient documentation

## 2018-05-24 DIAGNOSIS — N10 Acute tubulo-interstitial nephritis: Secondary | ICD-10-CM | POA: Diagnosis not present

## 2018-05-24 LAB — URINALYSIS, ROUTINE W REFLEX MICROSCOPIC
Bilirubin Urine: NEGATIVE
Glucose, UA: NEGATIVE mg/dL
Hgb urine dipstick: NEGATIVE
Ketones, ur: NEGATIVE mg/dL
Nitrite: NEGATIVE
Protein, ur: 30 mg/dL — AB
Specific Gravity, Urine: 1.018 (ref 1.005–1.030)
pH: 6 (ref 5.0–8.0)

## 2018-05-24 LAB — COMPREHENSIVE METABOLIC PANEL
ALT: 17 U/L (ref 0–44)
AST: 25 U/L (ref 15–41)
Albumin: 3.4 g/dL — ABNORMAL LOW (ref 3.5–5.0)
Alkaline Phosphatase: 103 U/L (ref 38–126)
Anion gap: 11 (ref 5–15)
BILIRUBIN TOTAL: 1 mg/dL (ref 0.3–1.2)
BUN: 14 mg/dL (ref 6–20)
CO2: 23 mmol/L (ref 22–32)
Calcium: 8.7 mg/dL — ABNORMAL LOW (ref 8.9–10.3)
Chloride: 103 mmol/L (ref 98–111)
Creatinine, Ser: 0.75 mg/dL (ref 0.44–1.00)
GFR calc Af Amer: >60 mL/min (ref >60)
GFR calc non Af Amer: >60 mL/min (ref >60)
Glucose, Bld: 113 mg/dL — ABNORMAL HIGH (ref 70–99)
Potassium: 3.2 mmol/L — ABNORMAL LOW (ref 3.5–5.1)
SODIUM: 137 mmol/L (ref 135–145)
TOTAL PROTEIN: 9.7 g/dL — AB (ref 6.5–8.1)

## 2018-05-24 LAB — CBC
HCT: 36.8 % (ref 36.0–46.0)
Hemoglobin: 11.3 g/dL — ABNORMAL LOW (ref 12.0–15.0)
MCH: 26.6 pg (ref 26.0–34.0)
MCHC: 30.7 g/dL (ref 30.0–36.0)
MCV: 86.6 fL (ref 80.0–100.0)
Platelets: 216 10*3/uL (ref 150–400)
RBC: 4.25 MIL/uL (ref 3.87–5.11)
RDW: 14.8 % (ref 11.5–15.5)
WBC: 4.7 10*3/uL (ref 4.0–10.5)
nRBC: 0 % (ref 0.0–0.2)

## 2018-05-24 LAB — I-STAT BETA HCG BLOOD, ED (MC, WL, AP ONLY): I-stat hCG, quantitative: <5 m[IU]/mL (ref <5)

## 2018-05-24 LAB — LIPASE, BLOOD: Lipase: 23 U/L (ref 11–51)

## 2018-05-24 MED ORDER — SODIUM CHLORIDE 0.9 % IV BOLUS
1000.0000 mL | Freq: Once | INTRAVENOUS | Status: AC
  Administered 2018-05-24: 1000 mL via INTRAVENOUS

## 2018-05-24 NOTE — Telephone Encounter (Signed)
See contact note. Patient to go to ER

## 2018-05-24 NOTE — ED Triage Notes (Addendum)
Patient reports she was sent from [facility] for treatment of kidney infection after seen yesterday and UA resulted. Hx HIV. Reports generalized body aches, dysuria, N/V, and fever.

## 2018-05-24 NOTE — ED Provider Notes (Signed)
[facility] DEPT Provider Note   CSN: [id number] Arrival date & time: 05/24/18  1932     History   Chief Complaint Chief Complaint  Patient presents with  . Generalized Body Aches  . Dysuria    HPI Beverly [name] is a 37 y.o. female.  HPI   Pt is a 37 y/o female with a h/o HIV, Kaposi sarcoma, CIN III with severe dysplasia who presents to the ED today c/o, fevers, generalized body aches, nausea, vomiting for the last 3 days.  States fevers at home have been as high as 103 Fahrenheit.  Today her fever was 102.  She took Tylenol around 2:30 PM.  She reports intermittent dysuria and urinary frequency. She also reports rhinorrhea, congestion, and cough that has been present for the last 5 days.   States she was seen in the cancer center yesterday and had UA completed that was positive for urinary tract infection.  Records reviewed, there was concern for pyelonephritis with patient's fevers, nausea and vomiting.  She is advised to come to the ED for IV antibiotics and possible admission.    Labs from 04/11/18 reviewed. HIV 1 RNA quant 95, HIV-1 RNA quant log, 1.98.  CD4 count on 04/06/18  Dr. Linus Salmons ID   nv x3 days,   Past Medical History:  Diagnosis Date  . Abscess   . AIDS ([place]) 03/19/2015  . Anxiety   . Asthma   . Blood transfusion without reported diagnosis   . Depression   . HIV infection ([place])   . Homeless 03/19/2015   states stays with family or friends. Does not rent or own a home, but does not live on streets.  . 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 Leg lesion 03/19/2015  . Major depression, recurrent ([place]) 03/19/2015  . 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 Skin ulcer ([place]) 05/06/2015    Patient Active Problem List   Diagnosis Date Noted  . Pyelonephritis 05/25/2018  . Ulceration, vulva 03/10/2018  . Itching 03/10/2018  . 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 Pancytopenia ([place]) 01/09/2016  . Major  depression, recurrent ([place]) 03/19/2015  . Homeless 03/19/2015  . 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 Depression 10/17/2013  . Anemia 10/17/2013  . Thrombocytopenia ([place]) 10/17/2013  . Cigarette smoker 10/17/2013  . Asthma 10/17/2013  . Encounter for general adult medical examination without abnormal findings 06/12/2011  . 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 Anxiety and depression 07/17/2008  . AIDS (acquired immune deficiency syndrome) ([place]) 06/28/2008    Past Surgical History:  Procedure Laterality Date  . CERVICAL CONIZATION W/BX N/A 04/14/2018   Procedure: COLD KNIFE CONIZATION CERVIX WITH BIOPSY;  Surgeon: Isabel Caprice, MD;  Location: [facility];  Service: Gynecology;  Laterality: N/A;  . CESAREAN SECTION    . DILATION AND CURETTAGE OF UTERUS N/A 04/14/2018   Procedure: ENDOCERVICAL CURETTAGE;  Surgeon: Isabel Caprice, MD;  Location: [facility];  Service: Gynecology;  Laterality: N/A;  . INCISION AND DRAINAGE ABSCESS Right 01/10/2016   Procedure: INCISION AND DRAINAGE RIGHT BUTTOCK, LEFT LABIAL , EXCISION AND DRAINAGE OF  RIGHT AXILLARY ABSCESS;  Surgeon: Excell Seltzer, MD;  Location: [facility];  Service: General;  Laterality: Right;  . TUBAL LIGATION  2005  . VULVA Milagros Loll BIOPSY N/A 04/14/2018   Procedure: VULVAR BIOPSIES;  Surgeon: Isabel Caprice, MD;  Location[facility];  Service: Gynecology;  Laterality: N/A;  OB History    Gravida  1   Para      Term      Preterm      AB      Living        SAB      TAB      Ectopic      Multiple      Live Births               Home Medications    Prior to Admission medications   Medication Sig Start Date End Date Taking? Authorizing Provider  azithromycin (ZITHROMAX) 600 MG tablet Take 2 tablets (1,200 mg total) by mouth once a week. 04/11/18  Yes Hatley Callas, NP  bictegravir-emtricitabine-tenofovir AF (BIKTARVY) 50-200-25 MG  TABS tablet Take 1 tablet by mouth daily. Try to take at the same time each day with or without food. 03/10/18  Yes [place] Callas, NP  DULoxetine (CYMBALTA) 30 MG capsule Take 1 capsule (30 mg total) by mouth daily for 7 days, THEN 2 capsules (60 mg total) daily. 05/04/18 07/10/18 Yes [place] Callas, NP  hydrOXYzine (ATARAX/VISTARIL) 25 MG tablet Take 1 tablet (25 mg total) by mouth 3 (three) times daily as needed. Patient taking differently: Take 25 mg by mouth 3 (three) times daily as needed for anxiety or itching.  03/10/18  Yes [place] Callas, NP  sulfamethoxazole-trimethoprim (BACTRIM DS,SEPTRA DS) 800-160 MG tablet Take 1 tablet by mouth daily. 03/10/18  Yes [place] Callas, NP  traZODone (DESYREL) 50 MG tablet Take 1-2 tablets (50-100 mg total) by mouth at bedtime. 04/11/18  Yes [place] Callas, NP  ibuprofen (ADVIL,MOTRIN) 600 MG tablet Take 1 tablet (600 mg total) by mouth every 6 (six) hours as needed. Patient not taking: Reported on 05/24/2018 09/08/17   Charlann Lange, PA-C  mupirocin ointment (BACTROBAN) 2 % Place 1 application into the nose 2 (two) times daily. Patient not taking: Reported on 05/24/2018 03/10/18   [place] Callas, NP  nitrofurantoin, macrocrystal-monohydrate, (MACROBID) 100 MG capsule Take 1 capsule (100 mg total) by mouth 2 (two) times daily. Patient not taking: Reported on 05/24/2018 05/05/18   Joylene John D, NP  pregabalin (LYRICA) 25 MG capsule Take 1 capsule (25 mg total) by mouth 2 (two) times daily. Patient not taking: Reported on 05/24/2018 03/10/18   [place] Callas, NP    Family History Family History  Problem Relation Age of Onset  . Throat cancer Mother        smoker  . Throat cancer Father        smoker  . Hypertension Father   . Cirrhosis Father   . Hypertension Other   . Cancer Other        smoker  . Diabetes Other   . Asthma Other     Social History Social History   Tobacco Use  . Smoking status: Current Every Day  Smoker    Packs/day: 0.10    Years: 3.00    Pack years: 0.30    Types: Cigarettes  . Smokeless tobacco: Never Used  . Tobacco comment: 1-2 a day  Substance Use Topics  . Alcohol use: Yes    Alcohol/week: 0.0 standard drinks    Comment: Ocassionally  . Drug use: Yes    Frequency: 2.0 times per week    Types: Marijuana, MDMA (Ecstacy)     Allergies   Bee venom and Morphine and related   Review of Systems Review of Systems  Physical Exam Updated Vital Signs BP 115/73   Pulse 91   Temp 98.9 F (37.2 C) (Oral)   Resp 15   Ht 5\' 1"  (1.549 m)   Wt 72.6 kg   LMP 05/11/2018   SpO2 100%   BMI 30.23 kg/m   Physical Exam  Constitutional: She appears well-developed and well-nourished. No distress.  HENT:  Head: Normocephalic and atraumatic.  Mouth/Throat: Posterior oropharyngeal erythema present. No oropharyngeal exudate or posterior oropharyngeal edema.  Eyes: Conjunctivae are normal.  Neck: Neck supple.  Cardiovascular: Normal rate, regular rhythm and normal heart sounds.  No murmur heard. Pulmonary/Chest: Effort normal. No respiratory distress. She has wheezes (LLL).  Abdominal: Soft. Bowel sounds are normal. There is no tenderness.  bilat CVA TTP  Musculoskeletal: Normal range of motion.  Neurological: She is alert.  Skin: Skin is warm and dry.  Psychiatric: She has a normal mood and affect.  Nursing note and vitals reviewed.   ED Treatments / Results  Labs (all labs ordered are listed, but only abnormal results are displayed) Labs Reviewed  COMPREHENSIVE METABOLIC PANEL - Abnormal; Notable for the following components:      Result Value   Potassium 3.2 (*)    Glucose, Bld 113 (*)    Calcium 8.7 (*)    Total Protein 9.7 (*)    Albumin 3.4 (*)    All other components within normal limits  CBC - Abnormal; Notable for the following components:   Hemoglobin 11.3 (*)    All other components within normal limits  URINALYSIS, ROUTINE W REFLEX MICROSCOPIC -  Abnormal; Notable for the following components:   Protein, ur 30 (*)    Leukocytes, UA SMALL (*)    Bacteria, UA MANY (*)    All other components within normal limits  URINE CULTURE  CULTURE, BLOOD (ROUTINE X 2)  CULTURE, BLOOD (ROUTINE X 2)  LIPASE, BLOOD  INFLUENZA PANEL BY PCR (TYPE A & B)  I-STAT BETA HCG BLOOD, ED ([facility], [facility], [facility] ONLY)    EKG None  Radiology Dg Chest 2 View  Result Date: 05/24/2018 CLINICAL DATA:  37 year old female with cough. EXAM: CHEST - 2 VIEW COMPARISON:  Chest radiograph dated 12/27/2017 FINDINGS: The heart size and mediastinal contours are within normal limits. Both lungs are clear. The visualized skeletal structures are unremarkable. IMPRESSION: No active cardiopulmonary disease. Electronically Signed   By: Anner Crete M.D.   On: 05/24/2018 23:26    Procedures Procedures (including critical care time)  Medications Ordered in ED Medications  sodium chloride 0.9 % bolus 1,000 mL (0 mLs Intravenous Stopped 05/25/18 0056)  cefTRIAXone (ROCEPHIN) 1 g in sodium chloride 0.9 % 100 mL IVPB (0 g Intravenous Stopped 05/25/18 0056)  ondansetron (ZOFRAN) injection 4 mg (4 mg Intravenous Given 05/25/18 0025)     Initial Impression / Assessment and Plan / ED Course  I have reviewed the triage vital signs and the nursing notes.  Pertinent labs & imaging results that were available during my care of the patient were reviewed by me and considered in my medical decision making (see chart for details).      Final Clinical Impressions(s) / ED Diagnoses   Final diagnoses:  Pyelonephritis   37 year old female with history of HIV who presents emergency department today for evaluation of fevers, nausea, vomiting, bilateral flank pain and urinary symptoms for the last several days.  Also with upper respiratory symptoms starting about 5 days ago.  She is not febrile on arrival to the ED  and her vital signs are stable.  She was seen in the cancer center yesterday and  diagnosed with urinary tract infection. UA and urine culture reviewed from 12/2 and culture grew greater than 100,000 colonies of E. Coli.  She was advised to come the ED for IV antibiotics and hospital admission.  On exam her abdomen is benign but she does have bilateral CVA tenderness.  Lungs with some wheezing noted. Chest x-ray without evidence of pneumonia.  CBC without leukocytosis, no anemia.  CMP with mild hypokalemia, low albumin.  Normal liver and kidney function.  Lipase normal.  Beta hCG negative.  Flu panel negative.  UA with proteinuria, leukocytes, 0-5 RBCs, 21-50 white blood cells and many bacteria.  Given patient's positive urine culture, fevers, nausea, vomiting and bilateral CVA tenderness, suspect that she may have pyelonephritis.  She was given 1 g ceftriaxone in the ED.   Feel the patient would benefit from admission for further treatment of her pyelonephritis given that she is currently immunocompromised and she will need close observation of her response to treatment.  Discussed with Dr. Hal Hope with hospitalist service who accepts patient for admission.  ED Discharge Orders    None       Rodney Booze, PA-C 05/25/18 [place], [place], DO 05/25/18 1511

## 2018-05-25 ENCOUNTER — Other Ambulatory Visit: Payer: Self-pay

## 2018-05-25 ENCOUNTER — Encounter (HOSPITAL_COMMUNITY): Payer: Self-pay

## 2018-05-25 ENCOUNTER — Observation Stay (HOSPITAL_COMMUNITY): Payer: Medicaid Other

## 2018-05-25 ENCOUNTER — Ambulatory Visit: Payer: Self-pay | Admitting: Internal Medicine

## 2018-05-25 DIAGNOSIS — N12 Tubulo-interstitial nephritis, not specified as acute or chronic: Secondary | ICD-10-CM

## 2018-05-25 DIAGNOSIS — B20 Human immunodeficiency virus [HIV] disease: Secondary | ICD-10-CM | POA: Diagnosis not present

## 2018-05-25 DIAGNOSIS — R112 Nausea with vomiting, unspecified: Secondary | ICD-10-CM | POA: Diagnosis present

## 2018-05-25 LAB — CBC WITH DIFFERENTIAL/PLATELET
Abs Immature Granulocytes: 0 10*3/uL (ref 0.00–0.07)
Basophils Absolute: 0 10*3/uL (ref 0.0–0.1)
Basophils Relative: 0 %
Eosinophils Absolute: 0 10*3/uL (ref 0.0–0.5)
Eosinophils Relative: 1 %
HCT: 32.6 % — ABNORMAL LOW (ref 36.0–46.0)
HEMOGLOBIN: 10 g/dL — AB (ref 12.0–15.0)
Immature Granulocytes: 0 %
Lymphocytes Relative: 41 %
Lymphs Abs: 1.5 10*3/uL (ref 0.7–4.0)
MCH: 27 pg (ref 26.0–34.0)
MCHC: 30.7 g/dL (ref 30.0–36.0)
MCV: 87.9 fL (ref 80.0–100.0)
Monocytes Absolute: 0.5 10*3/uL (ref 0.1–1.0)
Monocytes Relative: 15 %
Neutro Abs: 1.5 10*3/uL — ABNORMAL LOW (ref 1.7–7.7)
Neutrophils Relative %: 43 %
Platelets: 171 10*3/uL (ref 150–400)
RBC: 3.71 MIL/uL — AB (ref 3.87–5.11)
RDW: 14.8 % (ref 11.5–15.5)
WBC: 3.6 10*3/uL — ABNORMAL LOW (ref 4.0–10.5)
nRBC: 0 % (ref 0.0–0.2)

## 2018-05-25 LAB — BASIC METABOLIC PANEL
Anion gap: 7 (ref 5–15)
BUN: 12 mg/dL (ref 6–20)
CO2: 22 mmol/L (ref 22–32)
CREATININE: 0.59 mg/dL (ref 0.44–1.00)
Calcium: 7.8 mg/dL — ABNORMAL LOW (ref 8.9–10.3)
Chloride: 108 mmol/L (ref 98–111)
GFR calc non Af Amer: >60 mL/min (ref >60)
Glucose, Bld: 95 mg/dL (ref 70–99)
Potassium: 3.1 mmol/L — ABNORMAL LOW (ref 3.5–5.1)
Sodium: 137 mmol/L (ref 135–145)

## 2018-05-25 LAB — HEPATIC FUNCTION PANEL
ALT: 13 U/L (ref 0–44)
AST: 20 U/L (ref 15–41)
Albumin: 2.8 g/dL — ABNORMAL LOW (ref 3.5–5.0)
Alkaline Phosphatase: 87 U/L (ref 38–126)
Bilirubin, Direct: 0.1 mg/dL (ref 0.0–0.2)
Indirect Bilirubin: 0.9 mg/dL (ref 0.3–0.9)
Total Bilirubin: 1 mg/dL (ref 0.3–1.2)
Total Protein: 7.5 g/dL (ref 6.5–8.1)

## 2018-05-25 LAB — URINE CULTURE

## 2018-05-25 LAB — INFLUENZA PANEL BY PCR (TYPE A & B)
Influenza A By PCR: NEGATIVE
Influenza B By PCR: NEGATIVE

## 2018-05-25 MED ORDER — AZITHROMYCIN 600 MG PO TABS: 1200.0000 mg | ORAL_TABLET | ORAL | Status: DC

## 2018-05-25 MED ORDER — ACETAMINOPHEN 650 MG RE SUPP: 650.0000 mg | Freq: Four times a day (QID) | RECTAL | Status: DC | PRN

## 2018-05-25 MED ORDER — SODIUM CHLORIDE 0.9 % IV SOLN
INTRAVENOUS | Status: AC
  Administered 2018-05-25 (×2): via INTRAVENOUS

## 2018-05-25 MED ORDER — HYDROXYZINE HCL 25 MG PO TABS: 25.0000 mg | ORAL_TABLET | Freq: Three times a day (TID) | ORAL | Status: DC | PRN

## 2018-05-25 MED ORDER — SULFAMETHOXAZOLE-TRIMETHOPRIM 800-160 MG PO TABS
1.0000 | ORAL_TABLET | Freq: Every day | ORAL | Status: DC
  Administered 2018-05-25 – 2018-05-26 (×2): 1 via ORAL
  Filled 2018-05-25 (×2): qty 1

## 2018-05-25 MED ORDER — SODIUM CHLORIDE 0.9 % IV SOLN: 2.0000 g | INTRAVENOUS | Status: DC

## 2018-05-25 MED ORDER — ENOXAPARIN SODIUM 40 MG/0.4ML ~~LOC~~ SOLN
40.0000 mg | SUBCUTANEOUS | Status: DC
  Administered 2018-05-25: 40 mg via SUBCUTANEOUS
  Filled 2018-05-25: qty 0.4

## 2018-05-25 MED ORDER — ONDANSETRON HCL 4 MG/2ML IJ SOLN
4.0000 mg | Freq: Once | INTRAMUSCULAR | Status: AC
  Administered 2018-05-25: 4 mg via INTRAVENOUS
  Filled 2018-05-25: qty 2

## 2018-05-25 MED ORDER — ONDANSETRON HCL 4 MG/2ML IJ SOLN: 4.0000 mg | Freq: Four times a day (QID) | INTRAMUSCULAR | Status: DC | PRN

## 2018-05-25 MED ORDER — POTASSIUM CHLORIDE CRYS ER 20 MEQ PO TBCR
40.0000 meq | EXTENDED_RELEASE_TABLET | ORAL | Status: AC
  Administered 2018-05-25 (×2): 40 meq via ORAL
  Filled 2018-05-25 (×2): qty 2

## 2018-05-25 MED ORDER — SODIUM CHLORIDE 0.9 % IV SOLN
2.0000 g | INTRAVENOUS | Status: DC
  Administered 2018-05-25: 2 g via INTRAVENOUS
  Filled 2018-05-25: qty 2

## 2018-05-25 MED ORDER — SODIUM CHLORIDE 0.9 % IV SOLN
1.0000 g | Freq: Once | INTRAVENOUS | Status: AC
  Administered 2018-05-25: 1 g via INTRAVENOUS
  Filled 2018-05-25: qty 10

## 2018-05-25 MED ORDER — BICTEGRAVIR-EMTRICITAB-TENOFOV 50-200-25 MG PO TABS
1.0000 | ORAL_TABLET | Freq: Every day | ORAL | Status: DC
  Administered 2018-05-25 – 2018-05-26 (×2): 1 via ORAL
  Filled 2018-05-25 (×2): qty 1

## 2018-05-25 MED ORDER — DULOXETINE HCL 30 MG PO CPEP
30.0000 mg | ORAL_CAPSULE | Freq: Every day | ORAL | Status: DC
  Administered 2018-05-25 – 2018-05-26 (×2): 30 mg via ORAL
  Filled 2018-05-25 (×2): qty 1

## 2018-05-25 MED ORDER — ACETAMINOPHEN 325 MG PO TABS
650.0000 mg | ORAL_TABLET | Freq: Four times a day (QID) | ORAL | Status: DC | PRN
  Administered 2018-05-25: 650 mg via ORAL
  Filled 2018-05-25: qty 2

## 2018-05-25 MED ORDER — ONDANSETRON HCL 4 MG PO TABS: 4.0000 mg | ORAL_TABLET | Freq: Four times a day (QID) | ORAL | Status: DC | PRN

## 2018-05-25 MED ORDER — TRAZODONE HCL 50 MG PO TABS
50.0000 mg | ORAL_TABLET | Freq: Every day | ORAL | Status: DC
  Filled 2018-05-25: qty 1

## 2018-05-25 NOTE — ED Notes (Signed)
Sputum cup at bedside, patient aware that we need a sample. Will produce one when ready.

## 2018-05-25 NOTE — Telephone Encounter (Signed)
No worries. Apparently her vomiting got pretty bad. She is getting care where she needs. Thank you for the follow up!

## 2018-05-25 NOTE — Telephone Encounter (Signed)
Sorry for the duplicate note.  RN offered patient appointment next day, but she stated the earliest she could come was 12/4 due to work. Patient went to ER overnight. Landis Gandy, RN

## 2018-05-25 NOTE — Progress Notes (Signed)
Patient ID: Beverly [name], female   DOB: [birth year], 37 y.o.   MRN: [id number] Patient was admitted early this morning for generalized body ache, fever, nausea and vomiting and was started on intravenous antibiotics for pyelonephritis.  Patient seen and examined at bedside and plan of care discussed with her.  Still feeling nauseous.  Will continue IV fluids.  This morning's H&P and prior medical records reviewed by myself.  Current medications reviewed.  Repeat a.m. labs.  Continue antibiotics.  Follow cultures.  Probable discharge tomorrow if patient feels better and tolerates diet.

## 2018-05-25 NOTE — ED Notes (Signed)
ED TO INPATIENT HANDOFF REPORT  Name/Age/Gender Beverly [name] 37 y.o. female  Code Status    Code Status Orders  (From admission, onward)         Start     Ordered   05/25/18 0313  Full code  Continuous     05/25/18 0314        Code Status History    Date Active Date Inactive Code Status Order ID Comments User Context   04/14/2018 0916 04/14/2018 1732 Full Code [id number]  Dorothyann Gibbs, NP Inpatient   01/10/2016 0032 01/12/2016 1629 Full Code [id number]  Edwin Dada, MD Inpatient   08/13/2014 0009 08/16/2014 1738 Full Code [id number]  Rise Patience, MD Inpatient   10/16/2013 1803 10/19/2013 1725 Full Code [id number]  Domenic Polite, MD Inpatient      Home/SNF/Other Home  Chief Complaint pcp sent pt for kidney infection  Level of Care/Admitting Diagnosis ED Disposition    ED Disposition Condition [facility] [[id number]]  Level of Care: Med-Surg [16]  Diagnosis: Pyelonephritis [268341]  Admitting Physician: Rise Patience [phone]  Attending Physician: Rise Patience [phone]  PT Class (Do Not Modify): Observation [104]  PT Acc Code (Do Not Modify): Observation [10022]       Medical History Past Medical History:  Diagnosis Date  . Abscess   . AIDS ([place]) 03/19/2015  . Anxiety   . Asthma   . Blood transfusion without reported diagnosis   . Depression   . HIV infection ([place])   . Homeless 03/19/2015   states stays with family or friends. Does not rent or own a home, but does not live on streets.  . 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 Leg lesion 03/19/2015  . Major depression, recurrent ([place]) 03/19/2015  . 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 Skin ulcer ([place]) 05/06/2015    Allergies Allergies  Allergen Reactions  . Bee Venom Anaphylaxis, Shortness Of Breath and Swelling  . Morphine And Related Hives and Itching    Just can't take "morphine", vicodin is OK     IV Location/Drains/Wounds Patient Lines/Drains/Airways Status   Active Line/Drains/Airways    Name:   Placement date:   Placement time:   Site:   Days:   Peripheral IV 05/24/18 Left Antecubital   05/24/18    2345    Antecubital   1   Incision (Closed) 04/14/18 Perineum Other (Comment)   04/14/18    1001     41          Labs/Imaging Results for orders placed or performed during the hospital encounter of 05/24/18 (from the past 48 hour(s))  Urinalysis, Routine w reflex microscopic     Status: Abnormal   Collection Time: 05/24/18  8:13 PM  Result Value Ref Range   Color, Urine YELLOW YELLOW   APPearance CLEAR CLEAR   Specific Gravity, Urine 1.018 1.005 - 1.030   pH 6.0 5.0 - 8.0   Glucose, UA NEGATIVE NEGATIVE mg/dL   Hgb urine dipstick NEGATIVE NEGATIVE   Bilirubin Urine NEGATIVE NEGATIVE   Ketones, ur NEGATIVE NEGATIVE mg/dL   Protein, ur 30 (A) NEGATIVE mg/dL   Nitrite NEGATIVE NEGATIVE   Leukocytes, UA SMALL (A) NEGATIVE   RBC / HPF 0-5 0 - 5 RBC/hpf   WBC, UA 21-50 0 - 5 WBC/hpf   Bacteria, UA MANY (A) NONE SEEN   Squamous Epithelial / LPF 0-5 0 -  5   Mucus PRESENT     Comment: Performed at [facility], [place] [address] [place], [place] [address]  Lipase, blood     Status: None   Collection Time: 05/24/18  9:08 PM  Result Value Ref Range   Lipase 23 11 - 51 U/L    Comment: Performed at [facility], [place] [address], [place], [place] [address]  Comprehensive metabolic panel     Status: Abnormal   Collection Time: 05/24/18  9:08 PM  Result Value Ref Range   Sodium 137 135 - 145 mmol/L   Potassium 3.2 (L) 3.5 - 5.1 mmol/L   Chloride 103 98 - 111 mmol/L   CO2 23 22 - 32 mmol/L   Glucose, Bld 113 (H) 70 - 99 mg/dL   BUN 14 6 - 20 mg/dL   Creatinine, Ser 0.75 0.44 - 1.00 mg/dL   Calcium 8.7 (L) 8.9 - 10.3 mg/dL   Total Protein 9.7 (H) 6.5 - 8.1 g/dL   Albumin 3.4 (L) 3.5 - 5.0 g/dL   AST 25 15 - 41 U/L   ALT 17 0 - 44 U/L    Alkaline Phosphatase 103 38 - 126 U/L   Total Bilirubin 1.0 0.3 - 1.2 mg/dL   GFR calc non Af Amer >60 >60 mL/min   GFR calc Af Amer >60 >60 mL/min   Anion gap 11 5 - 15    Comment: Performed at [facility], [place] [address], [place], [place] [address]  CBC     Status: Abnormal   Collection Time: 05/24/18  9:08 PM  Result Value Ref Range   WBC 4.7 4.0 - 10.5 K/uL   RBC 4.25 3.87 - 5.11 MIL/uL   Hemoglobin 11.3 (L) 12.0 - 15.0 g/dL   HCT 36.8 36.0 - 46.0 %   MCV 86.6 80.0 - 100.0 fL   MCH 26.6 26.0 - 34.0 pg   MCHC 30.7 30.0 - 36.0 g/dL   RDW 14.8 11.5 - 15.5 %   Platelets 216 150 - 400 K/uL   nRBC 0.0 0.0 - 0.2 %    Comment: Performed at [facility], [place] [address], [place], [place] [address]  I-Stat beta hCG blood, ED     Status: None   Collection Time: 05/24/18  9:17 PM  Result Value Ref Range   I-stat hCG, quantitative <5.0 <5 mIU/mL   Comment 3            Comment:   GEST. AGE      CONC.  (mIU/mL)   <=1 WEEK        5 - 50     2 WEEKS       50 - 500     3 WEEKS       100 - 10,000     4 WEEKS     1,000 - 30,000        FEMALE AND NON-PREGNANT FEMALE:     LESS THAN 5 mIU/mL   Influenza panel by PCR (type A & B)     Status: None   Collection Time: 05/24/18 11:05 PM  Result Value Ref Range   Influenza A By PCR NEGATIVE NEGATIVE   Influenza B By PCR NEGATIVE NEGATIVE    Comment: (NOTE) The Xpert Xpress Flu assay is intended as an aid in the diagnosis of  influenza and should not be used as a sole basis for treatment.  This  assay is FDA approved for  nasopharyngeal swab specimens only. Nasal  washings and aspirates are unacceptable for Xpert Xpress Flu testing. Performed at [facility], [place] [address], [place], [place] [address]   Basic metabolic panel     Status: Abnormal   Collection Time: 05/25/18  5:26 AM  Result Value Ref Range   Sodium 137 135 - 145 mmol/L   Potassium 3.1 (L) 3.5 - 5.1 mmol/L   Chloride 108  98 - 111 mmol/L   CO2 22 22 - 32 mmol/L   Glucose, Bld 95 70 - 99 mg/dL   BUN 12 6 - 20 mg/dL   Creatinine, Ser 0.59 0.44 - 1.00 mg/dL   Calcium 7.8 (L) 8.9 - 10.3 mg/dL   GFR calc non Af Amer >60 >60 mL/min   GFR calc Af Amer >60 >60 mL/min   Anion gap 7 5 - 15    Comment: Performed at [facility], [place] [address], [place], [place] [address]  Hepatic function panel     Status: Abnormal   Collection Time: 05/25/18  5:26 AM  Result Value Ref Range   Total Protein 7.5 6.5 - 8.1 g/dL   Albumin 2.8 (L) 3.5 - 5.0 g/dL   AST 20 15 - 41 U/L   ALT 13 0 - 44 U/L   Alkaline Phosphatase 87 38 - 126 U/L   Total Bilirubin 1.0 0.3 - 1.2 mg/dL   Bilirubin, Direct 0.1 0.0 - 0.2 mg/dL   Indirect Bilirubin 0.9 0.3 - 0.9 mg/dL    Comment: Performed at [facility], [place] [address], [place], [place] [address]  CBC WITH DIFFERENTIAL     Status: Abnormal   Collection Time: 05/25/18  5:26 AM  Result Value Ref Range   WBC 3.6 (L) 4.0 - 10.5 K/uL   RBC 3.71 (L) 3.87 - 5.11 MIL/uL   Hemoglobin 10.0 (L) 12.0 - 15.0 g/dL   HCT 32.6 (L) 36.0 - 46.0 %   MCV 87.9 80.0 - 100.0 fL   MCH 27.0 26.0 - 34.0 pg   MCHC 30.7 30.0 - 36.0 g/dL   RDW 14.8 11.5 - 15.5 %   Platelets 171 150 - 400 K/uL   nRBC 0.0 0.0 - 0.2 %   Neutrophils Relative % 43 %   Neutro Abs 1.5 (L) 1.7 - 7.7 K/uL   Lymphocytes Relative 41 %   Lymphs Abs 1.5 0.7 - 4.0 K/uL   Monocytes Relative 15 %   Monocytes Absolute 0.5 0.1 - 1.0 K/uL   Eosinophils Relative 1 %   Eosinophils Absolute 0.0 0.0 - 0.5 K/uL   Basophils Relative 0 %   Basophils Absolute 0.0 0.0 - 0.1 K/uL   Immature Granulocytes 0 %   Abs Immature Granulocytes 0.00 0.00 - 0.07 K/uL    Comment: Performed at [facility], [place] [address], [place], [place] [address]   Dg Chest 2 View  Result Date: 05/24/2018 CLINICAL DATA:  37 year old female with cough. EXAM: CHEST - 2 VIEW COMPARISON:  Chest radiograph dated 12/27/2017  FINDINGS: The heart size and mediastinal contours are within normal limits. Both lungs are clear. The visualized skeletal structures are unremarkable. IMPRESSION: No active cardiopulmonary disease. Electronically Signed   By: Anner Crete M.D.   On: 05/24/2018 23:26   Ct Renal Stone Study  Result Date: 05/25/2018 CLINICAL DATA:  38 year old female with flank pain. Concern for renal calculus. EXAM: CT ABDOMEN AND PELVIS WITHOUT CONTRAST TECHNIQUE: Multidetector CT imaging of the abdomen and pelvis  was performed following the standard protocol without IV contrast. COMPARISON:  CT dated 01/09/2016 FINDINGS: Evaluation of this exam is limited in the absence of intravenous contrast. Lower chest: The visualized lung bases are clear. No intra-abdominal free air or free fluid. Hepatobiliary: No focal liver abnormality is seen. No gallstones, gallbladder wall thickening, or biliary dilatation. Pancreas: Unremarkable. No pancreatic ductal dilatation or surrounding inflammatory changes. Spleen: Normal in size without focal abnormality. Adrenals/Urinary Tract: The adrenal glands are unremarkable. Multiple nonobstructing bilateral renal calculi measure up to 5 mm in the interpolar aspect of the right kidney. There is no hydronephrosis on either side. The visualized ureters and urinary bladder appear unremarkable. Stomach/Bowel: Small scattered colonic diverticula without active inflammatory changes. There is no bowel obstruction or active inflammation. The appendix is not visualized with certainty. No inflammatory changes identified in the right lower quadrant. Vascular/Lymphatic: The abdominal aorta and IVC are grossly unremarkable on this noncontrast CT. No portal venous gas. Top-normal bilateral iliac chain lymph nodes similar to prior CT. Reproductive: The uterus is anteverted and grossly unremarkable. The ovaries appear unremarkable as well. Other: No abdominal wall hernia or abnormality. No abdominopelvic ascites.  Musculoskeletal: None IMPRESSION: Multiple nonobstructing bilateral renal calculi.  No hydronephrosis. Electronically Signed   By: Anner Crete M.D.   On: 05/25/2018 02:49    Pending Labs Unresulted Labs (From admission, onward)    Start     Ordered   06/01/18 0500  Creatinine, serum  (enoxaparin (LOVENOX)    CrCl >/= 30 ml/min)  Weekly,   R    Comments:  while on enoxaparin therapy    05/25/18 0314   05/25/18 0559  Expectorated sputum assessment w rflx to resp cult  Once,   R     05/25/18 0558   05/24/18 2305  Blood culture (routine x 2)  BLOOD CULTURE X 2,   STAT     05/24/18 2304   05/24/18 2214  Urine culture  ONCE - STAT,   STAT     05/24/18 2213          Vitals/Pain Today's Vitals   05/25/18 1000 05/25/18 1200 05/25/18 1202 05/25/18 1405  BP: 114/76 109/76 109/76 92/64  Pulse: 79 78 80 67  Resp:   17 18  Temp:      TempSrc:      SpO2: 100% 100% 97% 98%  Weight:      Height:      PainSc:        Isolation Precautions No active isolations  Medications Medications  bictegravir-emtricitabine-tenofovir AF (BIKTARVY) 50-200-25 MG per tablet 1 tablet (1 tablet Oral Given 05/25/18 0941)  azithromycin (ZITHROMAX) tablet 1,200 mg (has no administration in time range)  sulfamethoxazole-trimethoprim (BACTRIM DS,SEPTRA DS) 800-160 MG per tablet 1 tablet (1 tablet Oral Given 05/25/18 0941)  DULoxetine (CYMBALTA) DR capsule 30 mg (30 mg Oral Given 05/25/18 0941)  hydrOXYzine (ATARAX/VISTARIL) tablet 25 mg (has no administration in time range)  traZODone (DESYREL) tablet 50-100 mg (has no administration in time range)  acetaminophen (TYLENOL) tablet 650 mg (650 mg Oral Given 05/25/18 0524)    Or  acetaminophen (TYLENOL) suppository 650 mg ( Rectal See Alternative 05/25/18 0524)  ondansetron (ZOFRAN) tablet 4 mg (has no administration in time range)    Or  ondansetron (ZOFRAN) injection 4 mg (has no administration in time range)  enoxaparin (LOVENOX) injection 40 mg (40 mg  Subcutaneous Given 05/25/18 0943)  0.9 %  sodium chloride infusion ( Intravenous New Bag/Given 05/25/18 0441)  cefTRIAXone (  ROCEPHIN) 2 g in sodium chloride 0.9 % 100 mL IVPB (has no administration in time range)  potassium chloride SA (K-DUR,KLOR-CON) CR tablet 40 mEq (40 mEq Oral Given 05/25/18 1437)  sodium chloride 0.9 % bolus 1,000 mL (0 mLs Intravenous Stopped 05/25/18 0056)  cefTRIAXone (ROCEPHIN) 1 g in sodium chloride 0.9 % 100 mL IVPB (0 g Intravenous Stopped 05/25/18 0056)  ondansetron (ZOFRAN) injection 4 mg (4 mg Intravenous Given 05/25/18 0025)  cefTRIAXone (ROCEPHIN) 1 g in sodium chloride 0.9 % 100 mL IVPB (0 g Intravenous Stopped 05/25/18 0726)    Mobility walks

## 2018-05-25 NOTE — H&P (Signed)
History and Physical    Beverly [name] TML:[id number] DOB: [birth year] DOA: 05/24/2018  PCP: Kaibito Callas, NP  Patient coming from: Home.  Chief Complaint: Generalized body ache fever nausea vomiting.  HPI: Beverly [name] is a 37 y.o. 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  Patient states her symptoms started off with upper respiratory tract symptoms with productive cough and generalized body ache with flank pain nausea vomiting unable to keep in anything.  Symptoms worsened during this recent rain fall.  Patient also has been having some dysuria.  Patient was just recently restarted on antiretroviral.  ED Course: In the ER influenza PCR was negative chest x-ray was unremarkable.  UA is compatible with UTI and has been started on antibiotics for pyelonephritis.  CAT scan does show multiple renal stones but no definite evidence of any obstruction.  Given her immunosuppressed state with persistent nausea vomiting patient will need IV antibiotics.  Review of Systems: As per HPI, rest all negative.   Past Medical History:  Diagnosis Date  . Abscess   . AIDS ([place]) 03/19/2015  . Anxiety   . Asthma   . Blood transfusion without reported diagnosis   . Depression   . HIV infection ([place])   . Homeless 03/19/2015   states stays with family or friends. Does not rent or own a home, but does not live on streets.  . 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 Leg lesion 03/19/2015  . Major depression, recurrent ([place]) 03/19/2015  . 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 Skin ulcer ([place]) 05/06/2015    Past Surgical History:  Procedure Laterality Date  . CERVICAL CONIZATION W/BX N/A 04/14/2018   Procedure: COLD KNIFE CONIZATION CERVIX WITH BIOPSY;  Surgeon: Isabel Caprice, MD;  Location: [facility];  Service:  Gynecology;  Laterality: N/A;  . CESAREAN SECTION    . DILATION AND CURETTAGE OF UTERUS N/A 04/14/2018   Procedure: ENDOCERVICAL CURETTAGE;  Surgeon: Isabel Caprice, MD;  Location: [facility];  Service: Gynecology;  Laterality: N/A;  . INCISION AND DRAINAGE ABSCESS Right 01/10/2016   Procedure: INCISION AND DRAINAGE RIGHT BUTTOCK, LEFT LABIAL , EXCISION AND DRAINAGE OF  RIGHT AXILLARY ABSCESS;  Surgeon: Excell Seltzer, MD;  Location: [facility];  Service: General;  Laterality: Right;  . TUBAL LIGATION  2005  . VULVA Milagros Loll BIOPSY N/A 04/14/2018   Procedure: VULVAR BIOPSIES;  Surgeon: Isabel Caprice, MD;  Location[facility];  Service: Gynecology;  Laterality: N/A;     reports that she has been smoking cigarettes. She has a 0.30 pack-year smoking history. She has never used smokeless tobacco. She reports that she drinks alcohol. She reports that she has current or past drug history. Drugs: Marijuana and MDMA (Ecstacy). Frequency: 2.00 times per week.  Allergies  Allergen Reactions  . Bee Venom Anaphylaxis, Shortness Of Breath and Swelling  . Morphine And Related Hives and Itching    Just can't take "morphine", vicodin is OK    Family History  Problem Relation Age of Onset  . Throat cancer Mother        smoker  . Throat cancer Father        smoker  . Hypertension Father   . Cirrhosis Father   . Hypertension Other   . Cancer  Other        smoker  . Diabetes Other   . Asthma Other     Prior to Admission medications   Medication Sig Start Date End Date Taking? Authorizing Provider  azithromycin (ZITHROMAX) 600 MG tablet Take 2 tablets (1,200 mg total) by mouth once a week. 04/11/18  Yes Adin Callas, NP  bictegravir-emtricitabine-tenofovir AF (BIKTARVY) 50-200-25 MG TABS tablet Take 1 tablet by mouth daily. Try to take at the same time each day with or without food. 03/10/18  Yes [place] Callas, NP  DULoxetine (CYMBALTA) 30 MG capsule  Take 1 capsule (30 mg total) by mouth daily for 7 days, THEN 2 capsules (60 mg total) daily. 05/04/18 07/10/18 Yes [place] Callas, NP  hydrOXYzine (ATARAX/VISTARIL) 25 MG tablet Take 1 tablet (25 mg total) by mouth 3 (three) times daily as needed. Patient taking differently: Take 25 mg by mouth 3 (three) times daily as needed for anxiety or itching.  03/10/18  Yes [place] Callas, NP  sulfamethoxazole-trimethoprim (BACTRIM DS,SEPTRA DS) 800-160 MG tablet Take 1 tablet by mouth daily. 03/10/18  Yes Delmita Callas, NP  traZODone (DESYREL) 50 MG tablet Take 1-2 tablets (50-100 mg total) by mouth at bedtime. 04/11/18  Yes [place] Callas, NP  ibuprofen (ADVIL,MOTRIN) 600 MG tablet Take 1 tablet (600 mg total) by mouth every 6 (six) hours as needed. Patient not taking: Reported on 05/24/2018 09/08/17   Charlann Lange, PA-C  mupirocin ointment (BACTROBAN) 2 % Place 1 application into the nose 2 (two) times daily. Patient not taking: Reported on 05/24/2018 03/10/18    Callas, NP  nitrofurantoin, macrocrystal-monohydrate, (MACROBID) 100 MG capsule Take 1 capsule (100 mg total) by mouth 2 (two) times daily. Patient not taking: Reported on 05/24/2018 05/05/18   Joylene John D, NP  pregabalin (LYRICA) 25 MG capsule Take 1 capsule (25 mg total) by mouth 2 (two) times daily. Patient not taking: Reported on 05/24/2018 03/10/18    Callas, NP    Physical Exam: Vitals:   05/25/18 0030 05/25/18 0100 05/25/18 0240 05/25/18 0243  BP: 113/74 115/73 (!) 105/57   Pulse: 84 91 73 73  Resp: 16 15 16    Temp:      TempSrc:      SpO2: 100% 100% 98% 98%  Weight:      Height:          Constitutional: Moderately built and nourished. Vitals:   05/25/18 0030 05/25/18 0100 05/25/18 0240 05/25/18 0243  BP: 113/74 115/73 (!) 105/57   Pulse: 84 91 73 73  Resp: 16 15 16    Temp:      TempSrc:      SpO2: 100% 100% 98% 98%  Weight:      Height:       Eyes: Anicteric no pallor. ENMT: No  discharge from the ears eyes nose or mouth. Neck: No mass felt.  No neck rigidity. Respiratory: No rhonchi or crepitations. Cardiovascular: S1-S2 heard no murmurs appreciated. Abdomen: Soft nontender bowel sounds present. Musculoskeletal: No edema. Skin: No rash. Neurologic: Alert awake oriented to time place and person.  Moves all extremities. Psychiatric: Appears normal.   Labs on Admission: I have personally reviewed following labs and imaging studies  CBC: Recent Labs  Lab 05/24/18 2108  WBC 4.7  HGB 11.3*  HCT 36.8  MCV 86.6  PLT 416   Basic Metabolic Panel: Recent Labs  Lab 05/24/18 2108  NA 137  K 3.2*  CL 103  CO2  23  GLUCOSE 113*  BUN 14  CREATININE 0.75  CALCIUM 8.7*   GFR: Estimated Creatinine Clearance: 89.4 mL/min (by C-G formula based on SCr of 0.75 mg/dL). Liver Function Tests: Recent Labs  Lab 05/24/18 2108  AST 25  ALT 17  ALKPHOS 103  BILITOT 1.0  PROT 9.7*  ALBUMIN 3.4*   Recent Labs  Lab 05/24/18 2108  LIPASE 23   No results for input(s): AMMONIA in the last 168 hours. Coagulation Profile: No results for input(s): INR, PROTIME in the last 168 hours. Cardiac Enzymes: No results for input(s): CKTOTAL, CKMB, CKMBINDEX, TROPONINI in the last 168 hours. BNP (last 3 results) No results for input(s): PROBNP in the last 8760 hours. HbA1C: No results for input(s): HGBA1C in the last 72 hours. CBG: No results for input(s): GLUCAP in the last 168 hours. Lipid Profile: No results for input(s): CHOL, HDL, LDLCALC, TRIG, CHOLHDL, LDLDIRECT in the last 72 hours. Thyroid Function Tests: No results for input(s): TSH, T4TOTAL, FREET4, T3FREE, THYROIDAB in the last 72 hours. Anemia Panel: No results for input(s): VITAMINB12, FOLATE, FERRITIN, TIBC, IRON, RETICCTPCT in the last 72 hours. Urine analysis:    Component Value Date/Time   COLORURINE YELLOW 05/24/2018 2013   APPEARANCEUR CLEAR 05/24/2018 2013   LABSPEC 1.018 05/24/2018 2013    PHURINE 6.0 05/24/2018 2013   GLUCOSEU NEGATIVE 05/24/2018 2013   GLUCOSEU NEG mg/dL 06/28/2008 2142   HGBUR NEGATIVE 05/24/2018 2013   [place] NEGATIVE 05/24/2018 2013   [place] 05/24/2018 2013   PROTEINUR 30 (A) 05/24/2018 2013   UROBILINOGEN 1.0 08/12/2014 2350   NITRITE NEGATIVE 05/24/2018 2013   LEUKOCYTESUR SMALL (A) 05/24/2018 2013   Sepsis Labs: @LABRCNTIP (procalcitonin:4,lacticidven:4) ) Recent Results (from the past 240 hour(s))  Urine Culture     Status: Abnormal (Preliminary result)   Collection Time: 05/23/18  2:36 PM  Result Value Ref Range Status   Specimen Description   Final    URINE, CLEAN CATCH Performed at [facility], [place] [address] [place], [place] [address]    Special Requests   Final    NONE Performed at [facility], [place] [address], [place], [place] [address]    Culture >=100,000 COLONIES/mL ESCHERICHIA COLI (A)  Final   Report Status PENDING  Incomplete     Radiological Exams on Admission: Dg Chest 2 View  Result Date: 05/24/2018 CLINICAL DATA:  37 year old female with cough. EXAM: CHEST - 2 VIEW COMPARISON:  Chest radiograph dated 12/27/2017 FINDINGS: The heart size and mediastinal contours are within normal limits. Both lungs are clear. The visualized skeletal structures are unremarkable. IMPRESSION: No active cardiopulmonary disease. Electronically Signed   By: Anner Crete M.D.   On: 05/24/2018 23:26   Ct Renal Stone Study  Result Date: 05/25/2018 CLINICAL DATA:  37 year old female with flank pain. Concern for renal calculus. EXAM: CT ABDOMEN AND PELVIS WITHOUT CONTRAST TECHNIQUE: Multidetector CT imaging of the abdomen and pelvis was performed following the standard protocol without IV contrast. COMPARISON:  CT dated 01/09/2016 FINDINGS: Evaluation of this exam is limited in the absence of intravenous contrast. Lower chest: The visualized lung bases are clear. No intra-abdominal  free air or free fluid. Hepatobiliary: No focal liver abnormality is seen. No gallstones, gallbladder wall thickening, or biliary dilatation. Pancreas: Unremarkable. No pancreatic ductal dilatation or surrounding inflammatory changes. Spleen: Normal in size without focal abnormality. Adrenals/Urinary Tract: The adrenal glands are unremarkable. Multiple nonobstructing bilateral renal calculi measure up to 5 mm in the  interpolar aspect of the right kidney. There is no hydronephrosis on either side. The visualized ureters and urinary bladder appear unremarkable. Stomach/Bowel: Small scattered colonic diverticula without active inflammatory changes. There is no bowel obstruction or active inflammation. The appendix is not visualized with certainty. No inflammatory changes identified in the right lower quadrant. Vascular/Lymphatic: The abdominal aorta and IVC are grossly unremarkable on this noncontrast CT. No portal venous gas. Top-normal bilateral iliac chain lymph nodes similar to prior CT. Reproductive: The uterus is anteverted and grossly unremarkable. The ovaries appear unremarkable as well. Other: No abdominal wall hernia or abnormality. No abdominopelvic ascites. Musculoskeletal: None IMPRESSION: Multiple nonobstructing bilateral renal calculi.  No hydronephrosis. Electronically Signed   By: Anner Crete M.D.   On: 05/25/2018 02:49      Assessment/Plan Principal Problem:   Pyelonephritis Active Problems:   AIDS (acquired immune deficiency syndrome) (HCC)   Nausea & vomiting    1. Pyelonephritis -patient placed on ceftriaxone follow urine cultures continue hydration.  CT scan shows multiple renal stones with no obstruction. 2. Intractable nausea vomiting likely from pyelonephritis.  Placed on clear liquid diet may advance as tolerated. 3. Flulike symptoms -influenza PCR negative.  Will get sputum cultures. 4. AIDS with last CD4 count in October 2019 was 20.  Just restarted on antiretroviral.   Patient is also on prophylactic antibiotics azithromycin and Bactrim. 5. History of depression presently started on trazodone. 6. Squamosal cancer in situ of cervix being followed by gynecologist. 7. Mild anemia follow CBC.   DVT prophylaxis: Lovenox. Code Status: Full code. Family Communication: Discussed with patient. Disposition Plan: Home. Consults called: None. Admission status: Observation.   Rise Patience MD [facility] Pager [phone].  If 7PM-7AM, please contact night-coverage [email or web address] Password [email or web address]  05/25/2018, 3:14 AM

## 2018-05-25 NOTE — ED Notes (Signed)
This Probation officer called for transport. Patient awaiting transport.

## 2018-05-26 DIAGNOSIS — N12 Tubulo-interstitial nephritis, not specified as acute or chronic: Secondary | ICD-10-CM | POA: Diagnosis not present

## 2018-05-26 DIAGNOSIS — B20 Human immunodeficiency virus [HIV] disease: Secondary | ICD-10-CM | POA: Diagnosis not present

## 2018-05-26 DIAGNOSIS — R112 Nausea with vomiting, unspecified: Secondary | ICD-10-CM | POA: Diagnosis not present

## 2018-05-26 MED ORDER — CEPHALEXIN 500 MG PO CAPS: 500.0000 mg | ORAL_CAPSULE | Freq: Three times a day (TID) | ORAL | 0 refills | Status: AC

## 2018-05-26 MED ORDER — POTASSIUM CHLORIDE CRYS ER 20 MEQ PO TBCR
40.0000 meq | EXTENDED_RELEASE_TABLET | ORAL | Status: DC
  Administered 2018-05-26: 40 meq via ORAL
  Filled 2018-05-26: qty 2

## 2018-05-26 MED ORDER — HYDROCODONE-ACETAMINOPHEN 5-325 MG PO TABS
2.0000 | ORAL_TABLET | Freq: Once | ORAL | Status: AC
  Administered 2018-05-26: 2 via ORAL
  Filled 2018-05-26: qty 2

## 2018-05-26 MED ORDER — ONDANSETRON HCL 4 MG PO TABS: 4.0000 mg | ORAL_TABLET | Freq: Four times a day (QID) | ORAL | 0 refills | Status: DC | PRN

## 2018-05-26 NOTE — Discharge Summary (Signed)
Physician Discharge Summary  Beverly [name] YIR:[id number] DOB: [birth year] DOA: 05/24/2018  PCP: [place] Callas, NP  Admit date: 05/24/2018 Discharge date: 05/26/2018  Admitted From: Home Disposition: Home  Recommendations for Outpatient Follow-up:  1. Follow up with PCP in 1 week  2. Follow-up in the ED if symptoms worsen or new appear 3. 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  CT scan showed multiple renal stones but no definite evidence of any obstruction.  She was started on intravenous antibiotics for probable pyelonephritis.  Her condition has improved.  She feels better and wants to go home.  She will be discharged on oral antibiotics.  Discharge Diagnoses:  Principal Problem:   Pyelonephritis Active Problems:   AIDS (acquired immune deficiency syndrome) (HCC)   Nausea & vomiting  Probable pyelonephritis -CT scan showed multiple renal stones with no obstruction.  Might need outpatient urology follow-up -Was started on Rocephin.  Symptoms have improved.  Tolerating diet.  Will discharge home on Keflex for 5 more days with outpatient follow-up with PCP.  Recent urine culture had grown E. coli sensitive to Ancef.  Admission urine culture is growing gram-negative rods.  Intractable nausea and vomiting -Improved.  Currently tolerating diet.  Flulike symptoms -Influenza PCR negative.  Symptoms improving  AIDS with last CD4 count in October 2019 was 20: Just restarted on antiretroviral along with prophylactic Zithromax and Bactrim.  Patient was counseled about compliance with medications and follow-up.   Follow-up with ID provider at earliest convenience  Depression -Continue trazodone  Squamous cell cancer in situ of cervix -Follow-up with gynecology  Discharge Instructions  Discharge Instructions    Call MD for:  difficulty breathing, headache or visual disturbances   Complete by:  As directed    Call MD for:  extreme fatigue   Complete by:  As directed    Call MD for:  hives   Complete by:  As directed    Call MD for:  persistant dizziness or light-headedness   Complete by:  As directed    Call MD for:  persistant nausea and vomiting   Complete by:  As directed    Call MD for:  severe uncontrolled pain   Complete by:  As directed    Call MD for:  temperature >100.4   Complete by:  As directed    Diet general   Complete by:  As directed    Increase activity slowly   Complete by:  As directed      Allergies as of 05/26/2018      Reactions   Bee Venom Anaphylaxis, Shortness Of Breath, Swelling   Morphine And Related Hives, Itching   Just can't take "morphine", vicodin is OK      Medication List    STOP taking these medications   ibuprofen 600 MG tablet Commonly known as:  ADVIL,MOTRIN   mupirocin ointment 2 % Commonly known as:  BACTROBAN   nitrofurantoin (macrocrystal-monohydrate) 100 MG capsule Commonly known as:  MACROBID   pregabalin 25 MG capsule Commonly known as:  LYRICA     TAKE these medications   azithromycin 600 MG tablet Commonly known as:  ZITHROMAX Take 2 tablets (1,200 mg total) by  mouth once a week.   bictegravir-emtricitabine-tenofovir AF 50-200-25 MG Tabs tablet Commonly known as:  BIKTARVY Take 1 tablet by mouth daily. Try to take at the same time each day with or without food.   cephALEXin 500 MG capsule Commonly known as:  KEFLEX Take 1 capsule (500 mg total) by mouth 3 (three) times daily for 5 days.   DULoxetine 30 MG capsule Commonly known as:  CYMBALTA Take 1 capsule (30 mg total) by mouth daily for 7 days, THEN 2 capsules  (60 mg total) daily. Start taking on:  05/04/2018   hydrOXYzine 25 MG tablet Commonly known as:  ATARAX/VISTARIL Take 1 tablet (25 mg total) by mouth 3 (three) times daily as needed. What changed:  reasons to take this   ondansetron 4 MG tablet Commonly known as:  ZOFRAN Take 1 tablet (4 mg total) by mouth every 6 (six) hours as needed for nausea.   sulfamethoxazole-trimethoprim 800-160 MG tablet Commonly known as:  BACTRIM DS,SEPTRA DS Take 1 tablet by mouth daily.   traZODone 50 MG tablet Commonly known as:  DESYREL Take 1-2 tablets (50-100 mg total) by mouth at bedtime.      Follow-up Information    [place] Callas, NP Follow up.   Specialty:  Infectious Diseases Why:  At earliest convenience Contact information: [place] [id number] [phone]        Thayer Headings, MD .   Specialty:  Infectious Diseases Contact information: [address] [place] [address] [phone]          Allergies  Allergen Reactions  . Bee Venom Anaphylaxis, Shortness Of Breath and Swelling  . Morphine And Related Hives and Itching    Just can't take "morphine", vicodin is OK    Consultations:  None   Procedures/Studies: Dg Chest 2 View  Result Date: 05/24/2018 CLINICAL DATA:  37 year old female with cough. EXAM: CHEST - 2 VIEW COMPARISON:  Chest radiograph dated 12/27/2017 FINDINGS: The heart size and mediastinal contours are within normal limits. Both lungs are clear. The visualized skeletal structures are unremarkable. IMPRESSION: No active cardiopulmonary disease. Electronically Signed   By: Anner Crete M.D.   On: 05/24/2018 23:26   Ct Renal Stone Study  Result Date: 05/25/2018 CLINICAL DATA:  37 year old female with flank pain. Concern for renal calculus. EXAM: CT ABDOMEN AND PELVIS WITHOUT CONTRAST TECHNIQUE: Multidetector CT imaging of the abdomen and pelvis was performed following the standard protocol without IV contrast.  COMPARISON:  CT dated 01/09/2016 FINDINGS: Evaluation of this exam is limited in the absence of intravenous contrast. Lower chest: The visualized lung bases are clear. No intra-abdominal free air or free fluid. Hepatobiliary: No focal liver abnormality is seen. No gallstones, gallbladder wall thickening, or biliary dilatation. Pancreas: Unremarkable. No pancreatic ductal dilatation or surrounding inflammatory changes. Spleen: Normal in size without focal abnormality. Adrenals/Urinary Tract: The adrenal glands are unremarkable. Multiple nonobstructing bilateral renal calculi measure up to 5 mm in the interpolar aspect of the right kidney. There is no hydronephrosis on either side. The visualized ureters and urinary bladder appear unremarkable. Stomach/Bowel: Small scattered colonic diverticula without active inflammatory changes. There is no bowel obstruction or active inflammation. The appendix is not visualized with certainty. No inflammatory changes identified in the right lower quadrant. Vascular/Lymphatic: The abdominal aorta and IVC are grossly unremarkable on this noncontrast CT. No portal venous gas. Top-normal bilateral iliac chain lymph nodes similar to prior CT. Reproductive: The uterus is anteverted and grossly unremarkable.  The ovaries appear unremarkable as well. Other: No abdominal wall hernia or abnormality. No abdominopelvic ascites. Musculoskeletal: None IMPRESSION: Multiple nonobstructing bilateral renal calculi.  No hydronephrosis. Electronically Signed   By: Anner Crete M.D.   On: 05/25/2018 02:49       Subjective: Patient seen and examined at bedside.  She feels better and thinks that she is ready to go home.  Her symptoms are improving.  No current nausea or vomiting.  She is tolerating diet.  No overnight fevers.  Discharge Exam: Vitals:   05/25/18 2210 05/26/18 0503  BP: 108/72 108/67  Pulse: 67 77  Resp: 18 17  Temp: 97.7 F (36.5 C) (!) 97.5 F (36.4 C)  SpO2: 99% 99%    Vitals:   05/25/18 1405 05/25/18 1530 05/25/18 2210 05/26/18 0503  BP: 92/64 112/75 108/72 108/67  Pulse: 67 79 67 77  Resp: 18 16 18 17   Temp:  (!) 97.5 F (36.4 C) 97.7 F (36.5 C) (!) 97.5 F (36.4 C)  TempSrc:  Oral Oral Oral  SpO2: 98% 100% 99% 99%  Weight:  72 kg    Height:  5\' 1"  (1.549 m)      General: Pt is sleepy, wakes up on calling her name.  Answers questions.  No distress Cardiovascular: rate controlled, S1/S2 + Respiratory: bilateral decreased breath sounds at bases Abdominal: Soft, NT, ND, bowel sounds + Extremities: no edema, no cyanosis    The results of significant diagnostics from this hospitalization (including imaging, microbiology, ancillary and laboratory) are listed below for reference.     Microbiology: Recent Results (from the past 240 hour(s))  Urine Culture     Status: Abnormal   Collection Time: 05/23/18  2:36 PM  Result Value Ref Range Status   Specimen Description   Final    URINE, CLEAN CATCH Performed at [facility], [address] [place], [place] [address]    Special Requests   Final    NONE Performed at [facility], [place] [address], [place], [place] [address]    Culture >=100,000 COLONIES/mL ESCHERICHIA COLI (A)  Final   Report Status 05/25/2018 FINAL  Final   Organism ID, Bacteria ESCHERICHIA COLI (A)  Final      Susceptibility   Escherichia coli - MIC*    AMPICILLIN 16 INTERMEDIATE Intermediate     CEFAZOLIN <=4 SENSITIVE Sensitive     CEFTRIAXONE <=1 SENSITIVE Sensitive     CIPROFLOXACIN 0.5 SENSITIVE Sensitive     GENTAMICIN <=1 SENSITIVE Sensitive     IMIPENEM <=0.25 SENSITIVE Sensitive     NITROFURANTOIN <=16 SENSITIVE Sensitive     TRIMETH/SULFA >=320 RESISTANT Resistant     AMPICILLIN/SULBACTAM 4 SENSITIVE Sensitive     PIP/TAZO <=4 SENSITIVE Sensitive     Extended ESBL NEGATIVE Sensitive     * >=100,000 COLONIES/mL ESCHERICHIA COLI  Urine culture     Status:  Abnormal (Preliminary result)   Collection Time: 05/24/18 10:14 PM  Result Value Ref Range Status   Specimen Description   Final    URINE, CLEAN CATCH Performed at [place] [address], [place], [place] [address]    Special Requests   Final    NONE Performed at [facility], [place] [address], [place], [place] [address]    Culture >=100,000 COLONIES/mL GRAM NEGATIVE RODS (A)  Final   Report Status PENDING  Incomplete     Labs: BNP (last 3 results) No results for input(s): BNP  in the last 8760 hours. Basic Metabolic Panel: Recent Labs  Lab 05/24/18 2108 05/25/18 0526  NA 137 137  K 3.2* 3.1*  CL 103 108  CO2 23 22  GLUCOSE 113* 95  BUN 14 12  CREATININE 0.75 0.59  CALCIUM 8.7* 7.8*   Liver Function Tests: Recent Labs  Lab 05/24/18 2108 05/25/18 0526  AST 25 20  ALT 17 13  ALKPHOS 103 87  BILITOT 1.0 1.0  PROT 9.7* 7.5  ALBUMIN 3.4* 2.8*   Recent Labs  Lab 05/24/18 2108  LIPASE 23   No results for input(s): AMMONIA in the last 168 hours. CBC: Recent Labs  Lab 05/24/18 2108 05/25/18 0526  WBC 4.7 3.6*  NEUTROABS  --  1.5*  HGB 11.3* 10.0*  HCT 36.8 32.6*  MCV 86.6 87.9  PLT 216 171   Cardiac Enzymes: No results for input(s): CKTOTAL, CKMB, CKMBINDEX, TROPONINI in the last 168 hours. BNP: Invalid input(s): POCBNP CBG: No results for input(s): GLUCAP in the last 168 hours. D-Dimer No results for input(s): DDIMER in the last 72 hours. Hgb A1c No results for input(s): HGBA1C in the last 72 hours. Lipid Profile No results for input(s): CHOL, HDL, LDLCALC, TRIG, CHOLHDL, LDLDIRECT in the last 72 hours. Thyroid function studies No results for input(s): TSH, T4TOTAL, T3FREE, THYROIDAB in the last 72 hours.  Invalid input(s): FREET3 Anemia work up No results for input(s): VITAMINB12, FOLATE, FERRITIN, TIBC, IRON, RETICCTPCT in the last 72 hours. Urinalysis    Component Value Date/Time   COLORURINE YELLOW  05/24/2018 2013   APPEARANCEUR CLEAR 05/24/2018 2013   LABSPEC 1.018 05/24/2018 2013   PHURINE 6.0 05/24/2018 2013   GLUCOSEU NEGATIVE 05/24/2018 2013   GLUCOSEU NEG mg/dL 06/28/2008 2142   HGBUR NEGATIVE 05/24/2018 2013   [place] NEGATIVE 05/24/2018 2013   [place] NEGATIVE 05/24/2018 2013   PROTEINUR 30 (A) 05/24/2018 2013   UROBILINOGEN 1.0 08/12/2014 2350   NITRITE NEGATIVE 05/24/2018 2013   LEUKOCYTESUR SMALL (A) 05/24/2018 2013   Sepsis Labs Invalid input(s): PROCALCITONIN,  WBC,  LACTICIDVEN Microbiology Recent Results (from the past 240 hour(s))  Urine Culture     Status: Abnormal   Collection Time: 05/23/18  2:36 PM  Result Value Ref Range Status   Specimen Description   Final    URINE, CLEAN CATCH Performed at [facility], [place] [address] [place], [place] [address]    Special Requests   Final    NONE Performed at [facility], [place] [address] [place], [place] [address]    Culture >=100,000 COLONIES/mL ESCHERICHIA COLI (A)  Final   Report Status 05/25/2018 FINAL  Final   Organism ID, Bacteria ESCHERICHIA COLI (A)  Final      Susceptibility   Escherichia coli - MIC*    AMPICILLIN 16 INTERMEDIATE Intermediate     CEFAZOLIN <=4 SENSITIVE Sensitive     CEFTRIAXONE <=1 SENSITIVE Sensitive     CIPROFLOXACIN 0.5 SENSITIVE Sensitive     GENTAMICIN <=1 SENSITIVE Sensitive     IMIPENEM <=0.25 SENSITIVE Sensitive     NITROFURANTOIN <=16 SENSITIVE Sensitive     TRIMETH/SULFA >=320 RESISTANT Resistant     AMPICILLIN/SULBACTAM 4 SENSITIVE Sensitive     PIP/TAZO <=4 SENSITIVE Sensitive     Extended ESBL NEGATIVE Sensitive     * >=100,000 COLONIES/mL ESCHERICHIA COLI  Urine culture     Status: Abnormal (Preliminary result)   Collection Time: 05/24/18 10:14 PM  Result Value Ref Range Status  Specimen Description   Final    URINE, CLEAN CATCH Performed at [facility], [place] [address],  [place], [place] [address]    Special Requests   Final    NONE Performed at [facility], [place] [address] [place], [place] [address]    Culture >=100,000 COLONIES/mL GRAM NEGATIVE RODS (A)  Final   Report Status PENDING  Incomplete     Time coordinating discharge: 35 minutes  SIGNED:   Aline August, MD  [facility] 05/26/2018, 8:55 AM Pager: [phone]  If 7PM-7AM, please contact night-coverage [email or web address] Password [email or web address]

## 2018-05-26 NOTE — Progress Notes (Signed)
Patient discharged to home with family discharge instructions reviewed with patient who verbalized understanding.

## 2018-05-27 LAB — URINE CULTURE: Culture: >=100000 — AB

## 2018-05-30 LAB — CULTURE, BLOOD (ROUTINE X 2)
Culture: NO GROWTH
Culture: NO GROWTH

## 2018-06-16 ENCOUNTER — Ambulatory Visit: Payer: Medicaid Other

## 2018-06-16 NOTE — BH Specialist Note (Signed)
Patient ID: Beverly [name], female   DOB: [birth year], 37 y.o.   MRN: [id number]  INTERVENTION: Therapist provided outpatient mental house counseling to include ongoing assessment, support, and reinforcement.  Therapist provided supportive listening and cognitive reframing as patient ventilated.  Patient reports that she continues to struggle with health issues (kidney infection) as well as significant behavioral issues of her son.  Patient reports that she is now worried about her 80 year old son who is acting out where he is living (with his godmother).  Patient shared that her son might be engaging in illegal activities which is creating conflict regarding housing.   Focus of this session was on family boundaries.  INTERVENTION EFFECTIVENESS:  Patient was alert, oriented x4, with no SI, HI, or symptoms of psychosis (risk low), Patient was friendly, engaging openly and appropriately with clinician.  [name] speech was somewhat pressured and tangential but patient was re-directable. Patient shows poor insight into her own role into her life's stressors.  Patient benefited from supportive listening.Next session recommended in one to two weeks.  Jessica Priest, LCSW

## 2018-06-23 ENCOUNTER — Ambulatory Visit: Payer: Medicaid Other

## 2018-06-27 ENCOUNTER — Ambulatory Visit: Payer: Medicaid Other | Admitting: Infectious Diseases

## 2018-06-30 ENCOUNTER — Ambulatory Visit: Payer: Self-pay | Admitting: Obstetrics and Gynecology

## 2018-06-30 ENCOUNTER — Ambulatory Visit: Payer: Self-pay

## 2018-07-21 ENCOUNTER — Telehealth: Payer: Self-pay

## 2018-07-21 NOTE — Telephone Encounter (Addendum)
Patient appointment set up for labs/office visit with Janene Madeira, NP and counseling with Marcie Bal. Patient also confirmed that she is currently taking  both Bictarvy and Bactrium  Patient noted all appointment and verbalized understanding of appointment dates/times. S.Chantal Worthey,LPN

## 2018-07-21 NOTE — Telephone Encounter (Signed)
-----   Message from [place] Callas, NP sent at 07/21/2018  8:29 AM EST ----- Regarding: can we try to call her for an appointment? If you have a moment today would you please try to reach out to [facility] to see if we can get her back for a follow up appointment? Although she does not have cervical cancer at present she still needs a LEEP procedure and proper follow up to ensure that she does not go on to develop this.   I am hopeful she is taking her biktarvy and bactrim.Marland KitchenMarland KitchenMarland Kitchen

## 2018-07-22 ENCOUNTER — Other Ambulatory Visit: Payer: Medicaid Other

## 2018-08-04 ENCOUNTER — Ambulatory Visit: Payer: Medicaid Other | Admitting: Infectious Diseases

## 2018-08-04 ENCOUNTER — Ambulatory Visit: Payer: Medicaid Other

## 2018-09-03 ENCOUNTER — Emergency Department (HOSPITAL_BASED_OUTPATIENT_CLINIC_OR_DEPARTMENT_OTHER)
Admission: EM | Admit: 2018-09-03 | Discharge: 2018-09-03 | Disposition: A | Discharge status: Home / Self Care | Payer: Medicaid Other | Attending: Emergency Medicine | Admitting: Emergency Medicine

## 2018-09-03 ENCOUNTER — Other Ambulatory Visit: Payer: Self-pay

## 2018-09-03 ENCOUNTER — Encounter (HOSPITAL_BASED_OUTPATIENT_CLINIC_OR_DEPARTMENT_OTHER): Payer: Self-pay | Admitting: *Deleted

## 2018-09-03 DIAGNOSIS — B20 Human immunodeficiency virus [HIV] disease: Secondary | ICD-10-CM | POA: Diagnosis not present

## 2018-09-03 DIAGNOSIS — H5789 Other specified disorders of eye and adnexa: Secondary | ICD-10-CM | POA: Diagnosis present

## 2018-09-03 DIAGNOSIS — B309 Viral conjunctivitis, unspecified: Secondary | ICD-10-CM | POA: Insufficient documentation

## 2018-09-03 DIAGNOSIS — J45909 Unspecified asthma, uncomplicated: Secondary | ICD-10-CM | POA: Diagnosis not present

## 2018-09-03 DIAGNOSIS — Z79899 Other long term (current) drug therapy: Secondary | ICD-10-CM | POA: Diagnosis not present

## 2018-09-03 DIAGNOSIS — F17210 Nicotine dependence, cigarettes, uncomplicated: Secondary | ICD-10-CM | POA: Diagnosis not present

## 2018-09-03 DIAGNOSIS — Z9114 Patient's other noncompliance with medication regimen: Secondary | ICD-10-CM | POA: Insufficient documentation

## 2018-09-03 MED ORDER — TETRACAINE HCL 0.5 % OP SOLN
2.0000 [drp] | Freq: Once | OPHTHALMIC | Status: AC
  Administered 2018-09-03: 2 [drp] via OPHTHALMIC
  Filled 2018-09-03: qty 4

## 2018-09-03 MED ORDER — FLUORESCEIN SODIUM 1 MG OP STRP
ORAL_STRIP | OPHTHALMIC | Status: AC
  Filled 2018-09-03: qty 2

## 2018-09-03 MED ORDER — NAPHAZOLINE HCL 0.1 % OP SOLN: 1.0000 [drp] | Freq: Four times a day (QID) | OPHTHALMIC | 0 refills | Status: DC | PRN

## 2018-09-03 NOTE — ED Notes (Signed)
Denies injury to eyes. Pt drove here, states woke up this am with crusting around both eyes.

## 2018-09-03 NOTE — ED Triage Notes (Signed)
Pt reports both eyes with drainage upon waking. Reports Sx got worse while she was working. C/o bilateral eye pain and light sensitivity, worse in left eye.

## 2018-09-03 NOTE — ED Notes (Signed)
Pt states unable to do visual acuity d/t eye pain.

## 2018-09-03 NOTE — ED Provider Notes (Signed)
[facility] Provider Note   CSN: [id number] Arrival date & time: 09/03/18  1745    History   Chief Complaint Chief Complaint  Patient presents with  . Eye Drainage    HPI Beverly [name] is a 38 y.o. female w PMHx HIV, anxiety, asthma, presenting to the ED with complaint of bilateral eye pain that began this morning. Pt states she woke up this morning and felt pain to her eyes. Her left eye also itches.  She states she has foreign body sensation, feels like there is something in them.  Her left eye hurts worse than her right.  She has associated photophobia and tearing.  She is not a contact lens wearer.  She has blurry vision, however no loss of vision or double vision.  She works at [employer] for her job. Of note, patient is HIV positive.  She reports missing a few doses of her antiretrovirals last week.  Per chart review, last CD4 count on 04/06/2018 was 20, and HIV RNA is detectable.     The history is provided by the patient and medical records.    Past Medical History:  Diagnosis Date  . Abscess   . AIDS ([place]) 03/19/2015  . Anxiety   . Asthma   . Blood transfusion without reported diagnosis   . Depression   . HIV infection ([place])   . Homeless 03/19/2015   states stays with family or friends. Does not rent or own a home, but does not live on streets.  . 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 Leg lesion 03/19/2015  . Major depression, recurrent ([place]) 03/19/2015  . 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 Skin ulcer ([place]) 05/06/2015    Patient Active Problem List   Diagnosis Date Noted  . Pyelonephritis 05/25/2018  . Nausea & vomiting 05/25/2018  . Ulceration, vulva 03/10/2018  . Itching 03/10/2018  . 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 Pancytopenia ([place]) 01/09/2016  . Major depression, recurrent ([place]) 03/19/2015  . Homeless 03/19/2015  . 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 Depression 10/17/2013  . Anemia  10/17/2013  . Thrombocytopenia ([place]) 10/17/2013  . Cigarette smoker 10/17/2013  . Asthma 10/17/2013  . Encounter for general adult medical examination without abnormal findings 06/12/2011  . 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 Anxiety and depression 07/17/2008  . AIDS (acquired immune deficiency syndrome) ([place]) 06/28/2008    Past Surgical History:  Procedure Laterality Date  . CERVICAL CONIZATION W/BX N/A 04/14/2018   Procedure: COLD KNIFE CONIZATION CERVIX WITH BIOPSY;  Surgeon: Isabel Caprice, MD;  Location: [facility];  Service: Gynecology;  Laterality: N/A;  . CESAREAN SECTION    . DILATION AND CURETTAGE OF UTERUS N/A 04/14/2018   Procedure: ENDOCERVICAL CURETTAGE;  Surgeon: Isabel Caprice, MD;  Location: [facility];  Service: Gynecology;  Laterality: N/A;  . INCISION AND DRAINAGE ABSCESS Right 01/10/2016   Procedure: INCISION AND DRAINAGE RIGHT BUTTOCK, LEFT LABIAL , EXCISION AND DRAINAGE OF  RIGHT AXILLARY ABSCESS;  Surgeon: Excell Seltzer, MD;  Location: [facility];  Service: General;  Laterality: Right;  . TUBAL LIGATION  2005  . VULVA Milagros Loll BIOPSY N/A 04/14/2018   Procedure: VULVAR BIOPSIES;  Surgeon: Isabel Caprice, MD;  Location: [facility];  Service: Gynecology;  Laterality: N/A;     OB History    Gravida  1   Para      Term  Preterm      AB      Living        SAB      TAB      Ectopic      Multiple      Live Births               Home Medications    Prior to Admission medications   Medication Sig Start Date End Date Taking? Authorizing Provider  azithromycin (ZITHROMAX) 600 MG tablet Take 2 tablets (1,200 mg total) by mouth once a week. 04/11/18  Yes [place] Callas, NP  bictegravir-emtricitabine-tenofovir AF (BIKTARVY) 50-200-25 MG TABS tablet Take 1 tablet by mouth daily. Try to take at the same time each day with or without food. 03/10/18  Yes [place] Callas, NP   DULoxetine (CYMBALTA) 30 MG capsule Take 1 capsule (30 mg total) by mouth daily for 7 days, THEN 2 capsules (60 mg total) daily. 05/04/18 09/03/18 Yes Brier Callas, NP  sulfamethoxazole-trimethoprim (BACTRIM DS,SEPTRA DS) 800-160 MG tablet Take 1 tablet by mouth daily. 03/10/18  Yes [place] Callas, NP  hydrOXYzine (ATARAX/VISTARIL) 25 MG tablet Take 1 tablet (25 mg total) by mouth 3 (three) times daily as needed. Patient taking differently: Take 25 mg by mouth 3 (three) times daily as needed for anxiety or itching.  03/10/18   Gaylord Callas, NP  naphazoline (NAPHCON) 0.1 % ophthalmic solution Place 1 drop into both eyes 4 (four) times daily as needed for eye irritation. 09/03/18   Robinson, [place] N, PA-C  ondansetron (ZOFRAN) 4 MG tablet Take 1 tablet (4 mg total) by mouth every 6 (six) hours as needed for nausea. 05/26/18   Aline August, MD  traZODone (DESYREL) 50 MG tablet Take 1-2 tablets (50-100 mg total) by mouth at bedtime. 04/11/18   [place] Callas, NP    Family History Family History  Problem Relation Age of Onset  . Throat cancer Mother        smoker  . Throat cancer Father        smoker  . Hypertension Father   . Cirrhosis Father   . Hypertension Other   . Cancer Other        smoker  . Diabetes Other   . Asthma Other     Social History Social History   Tobacco Use  . Smoking status: Current Every Day Smoker    Packs/day: 0.10    Years: 3.00    Pack years: 0.30    Types: Cigarettes  . Smokeless tobacco: Never Used  . Tobacco comment: 1-2 a day  Substance Use Topics  . Alcohol use: Yes    Alcohol/week: 0.0 standard drinks    Comment: Ocassionally  . Drug use: Yes    Frequency: 2.0 times per week    Types: Marijuana, MDMA (Ecstacy)     Allergies   Bee venom and Morphine and related   Review of Systems Review of Systems  Eyes: Positive for photophobia, pain, discharge, redness and itching.  Allergic/Immunologic: Positive for  immunocompromised state.  Neurological: Negative for headaches.     Physical Exam Updated Vital Signs BP 134/88 (BP Location: Right Arm)   Pulse 90   Temp 98.4 F (36.9 C) (Oral)   Resp 20   Ht 5\' 1"  (1.549 m)   Wt 72.6 kg   LMP 08/11/2018   SpO2 100%   BMI 30.23 kg/m   Physical Exam Vitals signs and nursing note reviewed.  Constitutional:  Appearance: She is well-developed.  HENT:     Head: Normocephalic and atraumatic.  Eyes:     General:        Right eye: No foreign body or hordeolum.        Left eye: No foreign body or hordeolum.     Intraocular pressure: Right eye pressure is 15 mmHg. Left eye pressure is 10 mmHg.     Extraocular Movements: Extraocular movements intact.     Conjunctiva/sclera:     Right eye: Right conjunctiva is injected.     Left eye: Left conjunctiva is injected.     Comments: Left upper lid slightly swollen and red.  Eyes visualized under Woods lamp with fluorescein stain, no uptake noted.  No dendritic lesions. No rashes or lesions surrounding the eyes. No periorbital edema. Normal EOM. photophobia is present. PERRL. No hordeolum. No obvious foreign bodies.  Pulmonary:     Effort: Pulmonary effort is normal.  Neurological:     Mental Status: She is alert.  Psychiatric:        Mood and Affect: Mood normal.        Behavior: Behavior normal.      ED Treatments / Results  Labs (all labs ordered are listed, but only abnormal results are displayed) Labs Reviewed - No data to display  EKG None  Radiology No results found.  Procedures Procedures (including critical care time)  Medications Ordered in ED Medications  fluorescein 1 MG ophthalmic strip (has no administration in time range)  tetracaine (PONTOCAINE) 0.5 % ophthalmic solution 2 drop (2 drops Left Eye Given 09/03/18 1813)     Initial Impression / Assessment and Plan / ED Course  I have reviewed the triage vital signs and the nursing notes.  Pertinent labs & imaging  results that were available during my care of the patient were reviewed by me and considered in my medical decision making (see chart for details).        Patient presentation consistent with viral conjunctivitis.  Pt is not a contact lense wearer. Visual acuity is abnormal but equal in both eyes. Normal IOP bilaterally. No purulent discharge, corneal abrasions, entrapment,  or dendritic staining with fluorescein study.  Presentation non-concerning for  corneal abrasions or HSV.  No antibiotics are indicated and patient will be prescribed naphazoline for itching.  Personal hygiene and frequent handwashing discussed.  Patient advised to followup with ophthalmologist if symptoms persist or worsen in any way including vision change or purulent discharge.  Patient verbalizes understanding and is agreeable with discharge.  Pt discussed with Dr. Tamera Punt.  Discussed results, findings, treatment and follow up. Patient advised of return precautions. Patient verbalized understanding and agreed with plan.  Final Clinical Impressions(s) / ED Diagnoses   Final diagnoses:  Viral conjunctivitis of both eyes    ED Discharge Orders         Ordered    naphazoline (NAPHCON) 0.1 % ophthalmic solution  4 times daily PRN     09/03/18 2024           Robinson, [place] N, PA-C 09/03/18 2026    Malvin Johns, MD 09/03/18 2321

## 2018-09-03 NOTE — Discharge Instructions (Addendum)
Use the eye drops every 6 hours as needed for eye irritation. Wash your hands frequently. Apply a warm wash cloth to your eyes daily for symptom relief. Schedule an appointment with the eye doctor to follow up on your visit today. Return to the ED if you develop pus draining from your eyes, loss of vision, or new or worsening symptoms.

## 2018-09-07 ENCOUNTER — Telehealth: Payer: Self-pay | Admitting: *Deleted

## 2018-09-07 NOTE — Telephone Encounter (Signed)
We have tried - She has stopped answering Mitch's attempts to contact her.  She was willing to work with them back when we were ruling out cervical cancer but seems that she has decided against this now.

## 2018-09-07 NOTE — Telephone Encounter (Signed)
Phone call to patient went to her voicemail. RN left message checking on patient, asking her to call back. Landis Gandy, RN   Brisbin Callas, NP  P [facility] Triage Nurse Pool        Can someone please call Beverly [name] to try to get her back into care? Need to get [facility] L[facility] procedure also. She has unfortunately no showed me a few times and is no longer responding to Mitch's attempts to reach out either.   Thank you.

## 2018-09-12 NOTE — Telephone Encounter (Signed)
Beverly [name] ad I have tried several times to help Beverly [name] and its misfortunate that we work really hard to help but she is not ready to change. She struggles to see her role in her barriers to care

## 2018-09-23 ENCOUNTER — Ambulatory Visit: Payer: Self-pay | Admitting: Obstetrics

## 2018-10-20 ENCOUNTER — Encounter

## 2019-01-13 DIAGNOSIS — F17210 Nicotine dependence, cigarettes, uncomplicated: Secondary | ICD-10-CM | POA: Diagnosis not present

## 2019-01-13 DIAGNOSIS — H5789 Other specified disorders of eye and adnexa: Secondary | ICD-10-CM | POA: Diagnosis present

## 2019-01-13 DIAGNOSIS — R51 Headache: Secondary | ICD-10-CM | POA: Insufficient documentation

## 2019-01-13 DIAGNOSIS — J45909 Unspecified asthma, uncomplicated: Secondary | ICD-10-CM | POA: Insufficient documentation

## 2019-01-13 DIAGNOSIS — Z79899 Other long term (current) drug therapy: Secondary | ICD-10-CM | POA: Insufficient documentation

## 2019-01-13 DIAGNOSIS — B20 Human immunodeficiency virus [HIV] disease: Secondary | ICD-10-CM | POA: Diagnosis not present

## 2019-01-14 ENCOUNTER — Other Ambulatory Visit: Payer: Self-pay

## 2019-01-14 ENCOUNTER — Emergency Department (HOSPITAL_COMMUNITY)
Admission: EM | Admit: 2019-01-14 | Discharge: 2019-01-14 | Disposition: A | Discharge status: Home / Self Care | Payer: Medicaid Other | Attending: Emergency Medicine | Admitting: Emergency Medicine

## 2019-01-14 ENCOUNTER — Encounter (HOSPITAL_COMMUNITY): Payer: Self-pay | Admitting: Emergency Medicine

## 2019-01-14 DIAGNOSIS — R519 Headache, unspecified: Secondary | ICD-10-CM

## 2019-01-14 MED ORDER — KETOROLAC TROMETHAMINE 15 MG/ML IJ SOLN
15.0000 mg | Freq: Once | INTRAMUSCULAR | Status: AC
  Administered 2019-01-14: 15 mg via INTRAVENOUS
  Filled 2019-01-14: qty 1

## 2019-01-14 MED ORDER — DIPHENHYDRAMINE HCL 50 MG/ML IJ SOLN
25.0000 mg | Freq: Once | INTRAMUSCULAR | Status: AC
  Administered 2019-01-14: 04:00:00 25 mg via INTRAVENOUS
  Filled 2019-01-14: qty 1

## 2019-01-14 MED ORDER — DEXAMETHASONE SODIUM PHOSPHATE 10 MG/ML IJ SOLN
10.0000 mg | Freq: Once | INTRAMUSCULAR | Status: AC
  Administered 2019-01-14: 04:00:00 10 mg via INTRAVENOUS
  Filled 2019-01-14: qty 1

## 2019-01-14 MED ORDER — SODIUM CHLORIDE 0.9 % IV BOLUS
500.0000 mL | Freq: Once | INTRAVENOUS | Status: AC
  Administered 2019-01-14: 04:00:00 500 mL via INTRAVENOUS

## 2019-01-14 MED ORDER — PROCHLORPERAZINE EDISYLATE 10 MG/2ML IJ SOLN
10.0000 mg | Freq: Once | INTRAMUSCULAR | Status: AC
  Administered 2019-01-14: 04:00:00 10 mg via INTRAVENOUS
  Filled 2019-01-14: qty 2

## 2019-01-14 NOTE — ED Notes (Signed)
Pt sleeping. 

## 2019-01-14 NOTE — ED Provider Notes (Signed)
[facility] DEPT Provider Note  CSN: [id number] Arrival date & time: 01/13/19 2344  Chief Complaint(s) Eye Problem  HPI Beverly [name] is a 38 y.o. female   The history is provided by the patient.  Eye Problem Location:  Both eyes Quality:  Aching Severity:  Moderate Onset quality:  Gradual Duration:  3 days Timing:  Intermittent Chronicity:  Recurrent Relieved by:  Nothing Worsened by:  Bright light Associated symptoms: blurred vision, headaches (bitemporal), photophobia and tearing   Associated symptoms: no crusting, no discharge, no double vision, no inflammation, no nausea, no numbness, no redness, no swelling, no vomiting and no weakness     Had similar presentation in March.   Past Medical History Past Medical History:  Diagnosis Date  . Abscess   . AIDS ([place]) 03/19/2015  . Anxiety   . Asthma   . Blood transfusion without reported diagnosis   . Depression   . HIV infection ([place])   . Homeless 03/19/2015   states stays with family or friends. Does not rent or own a home, but does not live on streets.  . 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 Leg lesion 03/19/2015  . Major depression, recurrent ([place]) 03/19/2015  . 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 Skin ulcer ([place]) 05/06/2015   Patient Active Problem List   Diagnosis Date Noted  . Pyelonephritis 05/25/2018  . Nausea & vomiting 05/25/2018  . Ulceration, vulva 03/10/2018  . Itching 03/10/2018  . 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 Pancytopenia ([place]) 01/09/2016  . Major depression, recurrent ([place]) 03/19/2015  . Homeless 03/19/2015  . 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 Depression 10/17/2013  . Anemia 10/17/2013  . Thrombocytopenia ([place]) 10/17/2013  . Cigarette smoker 10/17/2013  . Asthma 10/17/2013  . Encounter for general adult medical examination without abnormal findings 06/12/2011  . 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 Anxiety and  depression 07/17/2008  . AIDS (acquired immune deficiency syndrome) ([place]) 06/28/2008   Home Medication(s) Prior to Admission medications   Medication Sig Start Date End Date Taking? Authorizing Provider  azithromycin (ZITHROMAX) 600 MG tablet Take 2 tablets (1,200 mg total) by mouth once a week. 04/11/18  Yes [place] Callas, NP  bictegravir-emtricitabine-tenofovir AF (BIKTARVY) 50-200-25 MG TABS tablet Take 1 tablet by mouth daily. Try to take at the same time each day with or without food. Patient taking differently: Take 1 tablet by mouth daily.  03/10/18  Yes [place] Callas, NP  ibuprofen (ADVIL) 200 MG tablet Take 200 mg by mouth every 6 (six) hours as needed for moderate pain.   Yes [provider]  ondansetron (ZOFRAN) 4 MG tablet Take 1 tablet (4 mg total) by mouth every 6 (six) hours as needed for nausea. 05/26/18  Yes Aline August, MD  DULoxetine (CYMBALTA) 30 MG capsule Take 1 capsule (30 mg total) by mouth daily for 7 days, THEN 2 capsules (60 mg total) daily. Patient not taking: Reported on 01/14/2019 05/04/18 09/03/18  [place] Callas, NP  hydrOXYzine (ATARAX/VISTARIL) 25 MG tablet Take 1 tablet (25 mg total) by mouth 3 (three) times daily as needed. Patient not taking: Reported on 01/14/2019 03/10/18   [place] Callas, NP  naphazoline (NAPHCON) 0.1 % ophthalmic solution Place 1 drop into both eyes 4 (four) times daily as needed for eye irritation. Patient not taking: Reported on 01/14/2019 09/03/18   Robinson, [place] N, PA-C  sulfamethoxazole-trimethoprim (BACTRIM DS,SEPTRA DS) 800-160 MG tablet  Take 1 tablet by mouth daily. 03/10/18   [place] Callas, NP  traZODone (DESYREL) 50 MG tablet Take 1-2 tablets (50-100 mg total) by mouth at bedtime. Patient not taking: Reported on 01/14/2019 04/11/18   [place]lina Callas, NP                                                                                                                                    Past Surgical  History Past Surgical History:  Procedure Laterality Date  . CERVICAL CONIZATION W/BX N/A 04/14/2018   Procedure: COLD KNIFE CONIZATION CERVIX WITH BIOPSY;  Surgeon: Isabel Caprice, MD;  Location: [facility];  Service: Gynecology;  Laterality: N/A;  . CESAREAN SECTION    . DILATION AND CURETTAGE OF UTERUS N/A 04/14/2018   Procedure: ENDOCERVICAL CURETTAGE;  Surgeon: Isabel Caprice, MD;  Location: [facility];  Service: Gynecology;  Laterality: N/A;  . INCISION AND DRAINAGE ABSCESS Right 01/10/2016   Procedure: INCISION AND DRAINAGE RIGHT BUTTOCK, LEFT LABIAL , EXCISION AND DRAINAGE OF  RIGHT AXILLARY ABSCESS;  Surgeon: Excell Seltzer, MD;  Location: [facility];  Service: General;  Laterality: Right;  . TUBAL LIGATION  2005  . VULVA Milagros Loll BIOPSY N/A 04/14/2018   Procedure: VULVAR BIOPSIES;  Surgeon: Isabel Caprice, MD;  Location: [facility];  Service: Gynecology;  Laterality: N/A;   Family History Family History  Problem Relation Age of Onset  . Throat cancer Mother        smoker  . Throat cancer Father        smoker  . Hypertension Father   . Cirrhosis Father   . Hypertension Other   . Cancer Other        smoker  . Diabetes Other   . Asthma Other     Social History Social History   Tobacco Use  . Smoking status: Current Every Day Smoker    Packs/day: 0.10    Years: 3.00    Pack years: 0.30    Types: Cigarettes  . Smokeless tobacco: Never Used  . Tobacco comment: 1-2 a day  Substance Use Topics  . Alcohol use: Yes    Alcohol/week: 0.0 standard drinks    Comment: Ocassionally  . Drug use: Yes    Frequency: 2.0 times per week    Types: Marijuana, MDMA (Ecstacy)   Allergies Bee venom and Morphine and related  Review of Systems Review of Systems  Eyes: Positive for blurred vision and photophobia. Negative for double vision, discharge and redness.  Gastrointestinal: Negative for nausea and vomiting.   Neurological: Positive for headaches (bitemporal). Negative for weakness and numbness.   All other systems are reviewed and are negative for acute change except as noted in the HPI  Physical Exam Vital Signs  I have reviewed the triage vital signs BP 123/80 (BP Location: Left Arm)   Pulse 72   Temp 97.9 F (  36.6 C) (Oral)   Resp 18   Ht 5\' 1"  (1.549 m)   Wt 77.1 kg   SpO2 100%   BMI 32.12 kg/m   Physical Exam Vitals signs reviewed.  Constitutional:      General: She is not in acute distress.    Appearance: She is well-developed. She is not diaphoretic.  HENT:     Head: Normocephalic and atraumatic.     Right Ear: External ear normal.     Left Ear: External ear normal.     Nose: Nose normal.  Eyes:     General: Lids are normal. No scleral icterus.       Right eye: No discharge.        Left eye: No discharge.     Conjunctiva/sclera: Conjunctivae normal.     Right eye: Right conjunctiva is not injected.     Left eye: Left conjunctiva is not injected.  Neck:     Musculoskeletal: Normal range of motion.     Trachea: Phonation normal.  Cardiovascular:     Rate and Rhythm: Normal rate and regular rhythm.  Pulmonary:     Effort: Pulmonary effort is normal. No respiratory distress.     Breath sounds: No stridor.  Abdominal:     General: There is no distension.  Musculoskeletal: Normal range of motion.  Neurological:     Mental Status: She is alert and oriented to person, place, and time.  Psychiatric:        Behavior: Behavior normal.     ED Results and Treatments Labs (all labs ordered are listed, but only abnormal results are displayed) Labs Reviewed - No data to display                                                                                                                       EKG  EKG Interpretation  Date/Time:    Ventricular Rate:    PR Interval:    QRS Duration:   QT Interval:    QTC Calculation:   R Axis:     Text Interpretation:         Radiology No results found.  Pertinent labs & imaging results that were available during my care of the patient were reviewed by me and considered in my medical decision making (see chart for details).  Medications Ordered in ED Medications  prochlorperazine (COMPAZINE) injection 10 mg (10 mg Intravenous Given 01/14/19 0333)  dexamethasone (DECADRON) injection 10 mg (10 mg Intravenous Given 01/14/19 0333)  diphenhydrAMINE (BENADRYL) injection 25 mg (25 mg Intravenous Given 01/14/19 0333)  ketorolac (TORADOL) 15 MG/ML injection 15 mg (15 mg Intravenous Given 01/14/19 0333)  sodium chloride 0.9 % bolus 500 mL (0 mLs Intravenous Stopped 01/14/19 0400)  Procedures Procedures  (including critical care time)  Medical Decision Making / ED Course I have reviewed the nursing notes for this encounter and the patient's prior records (if available in EHR or on provided paperwork).   Beverly [name] was evaluated in Emergency Department on 01/14/2019 for the symptoms described in the history of present illness. She was evaluated in the context of the global COVID-19 pandemic, which necessitated consideration that the patient might be at risk for infection with the SARS-CoV-2 virus that causes COVID-19. Institutional protocols and algorithms that pertain to the evaluation of patients at risk for COVID-19 are in a state of rapid change based on information released by regulatory bodies including the CDC and federal and state organizations. These policies and algorithms were followed during the patient's care in the ED.  Eyes wNL and no sign of infection. Non focal neuro exam. No recent head trauma. No fever. Doubt meningitis. Doubt intracranial bleed. Doubt IIH. No indication for imaging.   Will treat with migraine cocktail and reevaluate.  Pain improved. Requesting food and  drink. No longer has photophobia.  The patient is safe for discharge with strict return precautions.       Final Clinical Impression(s) / ED Diagnoses Final diagnoses:  Bad headache    The patient appears reasonably screened and/or stabilized for discharge and I doubt any other medical condition or other [facility] requiring further screening, evaluation, or treatment in the ED at this time prior to discharge.  Disposition: Discharge  Condition: Good  I have discussed the results, Dx and Tx plan with the patient who expressed understanding and agree(s) with the plan. Discharge instructions discussed at great length. The patient was given strict return precautions who verbalized understanding of the instructions. No further questions at time of discharge.    ED Discharge Orders    None        Follow Up: [place] Callas, NP [place] [address] [phone]  Schedule an appointment as soon as possible for a visit  If symptoms do not improve or  worsen      This chart was dictated using voice recognition software.  Despite best efforts to proofread,  errors can occur which can change the documentation meaning.   Fatima Blank, MD 01/14/19 [id number]

## 2019-01-14 NOTE — ED Triage Notes (Signed)
Patient complaining of eye pain and not been able to see. Patient started 3 days ago. Patient states she has been to [facility] and urgent care. She can not take the pain. Patient states that this happened once before and she had a CT scan and MRI. She states that they said they could not find nothing.

## 2019-01-16 ENCOUNTER — Telehealth: Payer: Self-pay | Admitting: *Deleted

## 2019-01-16 DIAGNOSIS — H547 Unspecified visual loss: Secondary | ICD-10-CM

## 2019-01-16 DIAGNOSIS — R519 Headache, unspecified: Secondary | ICD-10-CM

## 2019-01-16 DIAGNOSIS — B20 Human immunodeficiency virus [HIV] disease: Secondary | ICD-10-CM

## 2019-01-16 NOTE — Telephone Encounter (Signed)
Per Beverly [name] called patient and she is still having no vision and she is still taking her Biktarvy. She is fine with going back to ED as she is scared so she will arrange a ride as she can not drive herself.

## 2019-01-16 NOTE — Telephone Encounter (Signed)
Patient called to report that she has been having trouble with her vision. She reports that Wednesday she started to have blurry vision and a headache. And by Friday she could not see at all and had to be taken from work to the ED. She spent the night in the hospital and was given a referral for eye exam follow up. She was able to get an appointment for 01/25/19 which is the soonest they had. She called back to the ED to get a note for work as they only gave her one that states she was there until Saturday. She was told to call her PCP. She does not feel safe to drive as she still can not see and has the headache. She wants to know if Colletta Maryland will give her a note until she sees the eye doctor next week. Advised her will ask the provider and give her a call back

## 2019-01-16 NOTE — Telephone Encounter (Signed)
Do we know if she is taking her Biktarvy?   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 That may be a permanent vision loss if not dealt with properly and 8/05 is too long in my opinion to wait.   With her headaches if they are severe she likely needs a scan of head / lumbar puncture for consideration of cryptococcal meningitis. If she does go to the ER if we could call ahead to see if I can speak with her provider or get Dr. Janet Berlin to see her to help.   If she refuses going back to ER - I will see if Joycelyn Schmid can get her in to see Dr. Katy Fitch this week for urgent eye exam and stop by here for stat blood work.  Orders have been entered.

## 2019-01-16 NOTE — Telephone Encounter (Signed)
Excellent thank you - I tried to call ER to give them a heads up but have not reached them yet. Will try again soon.   (For ER Team): 1. 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   2. Given her headaches worry about toxoplasmosis vs cryptococcal meningitis - would recommend blood draw to check serum cryptococcal antigen STAT: If positive will certainly need lumbar puncture. Should have head imaging to assess for possible mass effect related to opportunistic process).   3. Would check serum RPR (neurosyphilis can cause vision loss and blurry vision)  4. Would check CMV PCR in blood (this does not make the diagnosis of #1but will help to monitor response to therapy).

## 2019-01-16 NOTE — Addendum Note (Signed)
Addended by: [place] Callas on: 01/16/2019 01:33 PM   Modules accepted: Orders

## 2019-01-18 ENCOUNTER — Telehealth: Payer: Self-pay

## 2019-01-18 NOTE — Telephone Encounter (Signed)
Attempted to call patient per Beverly Madeira, Np to schedule appointment. Patient was last seen on 03/2018, and has multiple no shows. Patient will need to schedule appointment for lab work and office visit two weeks after. Left voicemail requesting patient call office back to schedule appointments. [name]

## 2019-02-16 ENCOUNTER — Other Ambulatory Visit: Payer: Self-pay

## 2019-02-16 DIAGNOSIS — Z20822 Contact with and (suspected) exposure to covid-19: Secondary | ICD-10-CM

## 2019-02-18 LAB — NOVEL CORONAVIRUS, NAA: SARS-CoV-2, NAA: DETECTED — AB

## 2019-02-20 ENCOUNTER — Telehealth: Payer: Self-pay | Admitting: *Deleted

## 2019-02-20 NOTE — Telephone Encounter (Signed)
Patient called asking for advice. She was notified that she is Covid positive. She is homeless, currently living in a hotel, but has to leave today.  She will follow up per [facility] with the health department. RN gave her the [facility] phone number for assistance/guidance as well. Patient is asking for refill of biktarvy and bactrim, as she is scared about her covid diagnosis in the setting of being off her HIV meds.  She never followed up regarding her loss of vision/headaches in July.  She asked for an appointment to come in and see her provider.  RN advised she cannot come in to the clinic at this time due to the positive result. Please advise on refills for biktarvy/bactrim.  Landis Gandy, RN

## 2019-02-20 NOTE — Telephone Encounter (Signed)
Yes please refill her Biktarvy and Bactrim. I share her concern with being off her HIV medications and now with COVID-19 infection.   Is she having any symptoms of pneumonia or upper respiratory infection?   She should be on the look out for any developing respiratory symptoms (rapid breathing, cough with difficulty breathing, shortness of breath with rest/minimal effort, chest pain, etc) - all of these would warrant ER evaluation to consider inpatient monitoring.   She needs to stay under isolation precautions for 21 days following her positive test through September 17th (given her immunocompromised state). She can have a clinic visit for follow up after this time with me. She needs to also remain out of work for this time frame, not use public transportation system, etc - can provide a note for work if needed.   Given her homeless status I am not exactly certain what resources I can advise on further as to where she should reside. Does she have any family that can check in on her?   Can we get her scheduled for a phone visit with any provider this Thursday to check in remotely on symptom progression?  Thank you

## 2019-02-22 ENCOUNTER — Encounter: Payer: Self-pay | Admitting: Infectious Diseases

## 2019-02-22 ENCOUNTER — Other Ambulatory Visit: Payer: Self-pay | Admitting: *Deleted

## 2019-02-22 DIAGNOSIS — B20 Human immunodeficiency virus [HIV] disease: Secondary | ICD-10-CM

## 2019-02-22 MED ORDER — BICTEGRAVIR-EMTRICITAB-TENOFOV 50-200-25 MG PO TABS: 1.0000 | ORAL_TABLET | Freq: Every day | ORAL | 3 refills | Status: DC

## 2019-02-22 MED ORDER — SULFAMETHOXAZOLE-TRIMETHOPRIM 800-160 MG PO TABS: 1.0000 | ORAL_TABLET | Freq: Every day | ORAL | 3 refills | Status: DC

## 2019-02-22 MED ORDER — AZITHROMYCIN 600 MG PO TABS: 1200.0000 mg | ORAL_TABLET | ORAL | 3 refills | Status: DC

## 2019-02-22 NOTE — Telephone Encounter (Addendum)
Do I need to put in a referral or is that [place] case manager?   Letter for work has been entered and sent via [place] as requested.   Thank you for getting her hooked up with Marcie Bal - depression has always been a part of her challenges with retention in care.

## 2019-02-22 NOTE — Telephone Encounter (Addendum)
Spoke with [employer]. She does not have any symptoms. She has been in her car for 2 days. She is working with [facility] [address] for housing, needs a referral from a shelter for housing (has left messages requesting this referral, since she cannot walk in to talk to anyone). She will talk with [facility] as well (RN gave Amy Kassie's phone number).  She will need a letter for work, can access it via [employer].  She will follow up here 9/24 at 3:45. RN sent 4 month supply of biktarvy, azithromycin, bactrim to her preferred pharmacy. Beverly [name] has been feeling quite depressed.  RN offered to connect her to counseling as well (scheduled her for telephone reach out Thursday 12:30 on Janet's schedule).

## 2019-02-23 ENCOUNTER — Ambulatory Visit: Payer: Medicaid Other

## 2019-02-23 NOTE — Telephone Encounter (Signed)
Thanks for your helping excellent care.

## 2019-02-23 NOTE — Telephone Encounter (Signed)
Thank you for the letter - I'll make sure she sees it and can access it. [facility] should be taking care of the housing referral/case management attempt.

## 2019-03-01 ENCOUNTER — Other Ambulatory Visit: Payer: Self-pay

## 2019-03-01 DIAGNOSIS — Z20822 Contact with and (suspected) exposure to covid-19: Secondary | ICD-10-CM

## 2019-03-03 LAB — NOVEL CORONAVIRUS, NAA: SARS-CoV-2, NAA: NOT DETECTED

## 2019-03-16 ENCOUNTER — Ambulatory Visit: Payer: Medicaid Other | Admitting: Infectious Diseases

## 2019-03-16 ENCOUNTER — Ambulatory Visit: Payer: Medicaid Other

## 2019-03-17 NOTE — Telephone Encounter (Signed)
Extremely so. I will keep a reminder in my box to try to reach out periodically.

## 2019-03-17 NOTE — Telephone Encounter (Signed)
It is unfortunate that she no-showed and did not call to reschedule.

## 2019-04-05 ENCOUNTER — Emergency Department (HOSPITAL_BASED_OUTPATIENT_CLINIC_OR_DEPARTMENT_OTHER): Payer: Medicaid Other

## 2019-04-05 ENCOUNTER — Encounter (HOSPITAL_BASED_OUTPATIENT_CLINIC_OR_DEPARTMENT_OTHER): Payer: Self-pay | Admitting: *Deleted

## 2019-04-05 ENCOUNTER — Other Ambulatory Visit: Payer: Self-pay

## 2019-04-05 ENCOUNTER — Emergency Department (HOSPITAL_BASED_OUTPATIENT_CLINIC_OR_DEPARTMENT_OTHER)
Admission: EM | Admit: 2019-04-05 | Discharge: 2019-04-06 | Disposition: A | Discharge status: Home / Self Care | Payer: Medicaid Other | Attending: Emergency Medicine | Admitting: Emergency Medicine

## 2019-04-05 DIAGNOSIS — F17210 Nicotine dependence, cigarettes, uncomplicated: Secondary | ICD-10-CM | POA: Diagnosis not present

## 2019-04-05 DIAGNOSIS — Z20828 Contact with and (suspected) exposure to other viral communicable diseases: Secondary | ICD-10-CM | POA: Insufficient documentation

## 2019-04-05 DIAGNOSIS — J45909 Unspecified asthma, uncomplicated: Secondary | ICD-10-CM | POA: Insufficient documentation

## 2019-04-05 DIAGNOSIS — R591 Generalized enlarged lymph nodes: Secondary | ICD-10-CM | POA: Diagnosis not present

## 2019-04-05 DIAGNOSIS — R102 Pelvic and perineal pain: Secondary | ICD-10-CM | POA: Diagnosis present

## 2019-04-05 DIAGNOSIS — B20 Human immunodeficiency virus [HIV] disease: Secondary | ICD-10-CM | POA: Insufficient documentation

## 2019-04-05 DIAGNOSIS — Z79899 Other long term (current) drug therapy: Secondary | ICD-10-CM | POA: Diagnosis not present

## 2019-04-05 LAB — CBC WITH DIFFERENTIAL/PLATELET
Abs Immature Granulocytes: 0.05 10*3/uL (ref 0.00–0.07)
Basophils Absolute: 0 10*3/uL (ref 0.0–0.1)
Basophils Relative: 0 %
Eosinophils Absolute: 0 10*3/uL (ref 0.0–0.5)
Eosinophils Relative: 0 %
HCT: 34.4 % — ABNORMAL LOW (ref 36.0–46.0)
Hemoglobin: 10.6 g/dL — ABNORMAL LOW (ref 12.0–15.0)
Immature Granulocytes: 1 %
Lymphocytes Relative: 5 %
Lymphs Abs: 0.5 10*3/uL — ABNORMAL LOW (ref 0.7–4.0)
MCH: 26.5 pg (ref 26.0–34.0)
MCHC: 30.8 g/dL (ref 30.0–36.0)
MCV: 86 fL (ref 80.0–100.0)
Monocytes Absolute: 0.2 10*3/uL (ref 0.1–1.0)
Monocytes Relative: 2 %
Neutro Abs: 9.2 10*3/uL — ABNORMAL HIGH (ref 1.7–7.7)
Neutrophils Relative %: 92 %
Platelets: 215 10*3/uL (ref 150–400)
RBC: 4 MIL/uL (ref 3.87–5.11)
RDW: 15.7 % — ABNORMAL HIGH (ref 11.5–15.5)
WBC: 10 10*3/uL (ref 4.0–10.5)
nRBC: 0 % (ref 0.0–0.2)

## 2019-04-05 LAB — COMPREHENSIVE METABOLIC PANEL
ALT: 18 U/L (ref 0–44)
AST: 26 U/L (ref 15–41)
Albumin: 3.4 g/dL — ABNORMAL LOW (ref 3.5–5.0)
Alkaline Phosphatase: 77 U/L (ref 38–126)
Anion gap: 10 (ref 5–15)
BUN: 11 mg/dL (ref 6–20)
CO2: 22 mmol/L (ref 22–32)
Calcium: 8.5 mg/dL — ABNORMAL LOW (ref 8.9–10.3)
Chloride: 105 mmol/L (ref 98–111)
Creatinine, Ser: 0.73 mg/dL (ref 0.44–1.00)
GFR calc Af Amer: >60 mL/min (ref >60)
GFR calc non Af Amer: >60 mL/min (ref >60)
Glucose, Bld: 107 mg/dL — ABNORMAL HIGH (ref 70–99)
Potassium: 2.9 mmol/L — ABNORMAL LOW (ref 3.5–5.1)
Sodium: 137 mmol/L (ref 135–145)
Total Bilirubin: 1.1 mg/dL (ref 0.3–1.2)
Total Protein: 8.2 g/dL — ABNORMAL HIGH (ref 6.5–8.1)

## 2019-04-05 LAB — URINALYSIS, ROUTINE W REFLEX MICROSCOPIC
Bilirubin Urine: NEGATIVE
Glucose, UA: NEGATIVE mg/dL
Hgb urine dipstick: NEGATIVE
Ketones, ur: NEGATIVE mg/dL
Nitrite: NEGATIVE
Protein, ur: NEGATIVE mg/dL
Specific Gravity, Urine: 1.025 (ref 1.005–1.030)
pH: 6 (ref 5.0–8.0)

## 2019-04-05 LAB — WET PREP, GENITAL
Sperm: NONE SEEN
Trich, Wet Prep: NONE SEEN
WBC, Wet Prep HPF POC: NONE SEEN
Yeast Wet Prep HPF POC: NONE SEEN

## 2019-04-05 LAB — URINALYSIS, MICROSCOPIC (REFLEX)

## 2019-04-05 LAB — SARS CORONAVIRUS 2 BY RT PCR (HOSPITAL ORDER, PERFORMED IN ~~LOC~~ HOSPITAL LAB): SARS Coronavirus 2: NEGATIVE

## 2019-04-05 LAB — PREGNANCY, URINE: Preg Test, Ur: NEGATIVE

## 2019-04-05 LAB — LACTIC ACID, PLASMA: Lactic Acid, Venous: 1.9 mmol/L (ref 0.5–1.9)

## 2019-04-05 MED ORDER — FENTANYL CITRATE (PF) 100 MCG/2ML IJ SOLN
50.0000 ug | Freq: Once | INTRAMUSCULAR | Status: AC
  Administered 2019-04-05: 50 ug via INTRAVENOUS
  Filled 2019-04-05: qty 2

## 2019-04-05 MED ORDER — ALBUTEROL SULFATE HFA 108 (90 BASE) MCG/ACT IN AERS
4.0000 | INHALATION_SPRAY | Freq: Once | RESPIRATORY_TRACT | Status: AC
  Administered 2019-04-05: 4 via RESPIRATORY_TRACT
  Filled 2019-04-05: qty 6.7

## 2019-04-05 MED ORDER — ACETAMINOPHEN 325 MG PO TABS
650.0000 mg | ORAL_TABLET | Freq: Once | ORAL | Status: AC
  Administered 2019-04-05: 650 mg via ORAL
  Filled 2019-04-05: qty 2

## 2019-04-05 MED ORDER — LORAZEPAM 2 MG/ML IJ SOLN
0.5000 mg | Freq: Once | INTRAMUSCULAR | Status: AC
  Administered 2019-04-05: 0.5 mg via INTRAVENOUS
  Filled 2019-04-05: qty 1

## 2019-04-05 MED ORDER — METRONIDAZOLE 500 MG PO TABS: 500.0000 mg | ORAL_TABLET | Freq: Two times a day (BID) | ORAL | 0 refills | Status: DC

## 2019-04-05 MED ORDER — DOXYCYCLINE HYCLATE 100 MG PO TABS
100.0000 mg | ORAL_TABLET | Freq: Once | ORAL | Status: AC
  Administered 2019-04-05: 100 mg via ORAL
  Filled 2019-04-05: qty 1

## 2019-04-05 MED ORDER — IOHEXOL 300 MG/ML  SOLN
100.0000 mL | Freq: Once | INTRAMUSCULAR | Status: AC | PRN
  Administered 2019-04-05: 20:00:00 100 mL via INTRAVENOUS

## 2019-04-05 MED ORDER — CEFTRIAXONE SODIUM 250 MG IJ SOLR
250.0000 mg | Freq: Once | INTRAMUSCULAR | Status: AC
  Administered 2019-04-05: 250 mg via INTRAMUSCULAR
  Filled 2019-04-05: qty 250

## 2019-04-05 MED ORDER — ONDANSETRON HCL 4 MG/2ML IJ SOLN
4.0000 mg | Freq: Once | INTRAMUSCULAR | Status: AC
  Administered 2019-04-05: 4 mg via INTRAVENOUS
  Filled 2019-04-05: qty 2

## 2019-04-05 MED ORDER — DOXYCYCLINE HYCLATE 100 MG PO CAPS: 100.0000 mg | ORAL_CAPSULE | Freq: Two times a day (BID) | ORAL | 0 refills | Status: DC

## 2019-04-05 MED ORDER — POTASSIUM CHLORIDE 10 MEQ/100ML IV SOLN
10.0000 meq | INTRAVENOUS | Status: AC
  Administered 2019-04-05 (×3): 10 meq via INTRAVENOUS
  Filled 2019-04-05 (×3): qty 100

## 2019-04-05 MED ORDER — POTASSIUM CHLORIDE CRYS ER 20 MEQ PO TBCR
40.0000 meq | EXTENDED_RELEASE_TABLET | Freq: Once | ORAL | Status: AC
  Administered 2019-04-05: 40 meq via ORAL
  Filled 2019-04-05: qty 2

## 2019-04-05 NOTE — Discharge Instructions (Signed)
It is important that you follow-up with Beverly [name] as soon as possible, by the end of the week, for recheck and further evaluation of the lymphadenopathy in your pelvis.  This is concerning and needs to be further worked up to rule out cancer.  We will treat for possible infection near your labia.  We have also sent swabs for gonorrhea and chlamydia and you will be called if these are positive.  If positive, please make your sexual partners aware that they will need to be treated as well.  The antibiotics we are giving for the labial infection would cover this for you.  Please return to the emergency department immediately if you develop any new or worsening symptoms.

## 2019-04-05 NOTE — ED Provider Notes (Signed)
[facility] EMERGENCY DEPARTMENT Provider Note   CSN: [id number] Arrival date & time: 04/05/19  1624     History   Chief Complaint Chief Complaint  Patient presents with   Fever    HPI Beverly [name] is a 38 y.o. female with history of HIV, asthma who presents with 1 day history of fever, cough, and right groin and abdominal pain.  She has had one episode of vomiting at home.  She denies any bloody stools.  She denies any chest pain or shortness of breath.  Her symptoms started suddenly today.  Patient denies any history of abdominal surgeries.  Patient was given to 81 mg aspirin prior to arrival.     HPI  Past Medical History:  Diagnosis Date   Abscess    AIDS ([place]) 03/19/2015   Anxiety    Asthma    Blood transfusion without reported diagnosis    Depression    HIV infection ([place])    Homeless 03/19/2015   states stays with family or friends. Does not rent or own a home, but does not live on streets.   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   Depression 10/17/2013   Anemia 10/17/2013   Thrombocytopenia ([place]) 10/17/2013   Cigarette smoker 10/17/2013   Asthma 10/17/2013   Encounter for general adult medical examination without abnormal findings 06/12/2011   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   Anxiety and depression 07/17/2008   AIDS (acquired immune deficiency syndrome) ([place]) 06/28/2008    Past Surgical History:  Procedure  Laterality Date   CERVICAL CONIZATION W/BX N/A 04/14/2018   Procedure: COLD KNIFE CONIZATION CERVIX WITH BIOPSY;  Surgeon: Isabel Caprice, MD;  Location: [facility];  Service: Gynecology;  Laterality: N/A;   CESAREAN SECTION     DILATION AND CURETTAGE OF UTERUS N/A 04/14/2018   Procedure: ENDOCERVICAL CURETTAGE;  Surgeon: Isabel Caprice, MD;  Location: [facility];  Service: Gynecology;  Laterality: N/A;   INCISION AND DRAINAGE ABSCESS Right 01/10/2016   Procedure: INCISION AND DRAINAGE RIGHT BUTTOCK, LEFT LABIAL , EXCISION AND DRAINAGE OF  RIGHT AXILLARY ABSCESS;  Surgeon: Excell Seltzer, MD;  Location: [facility];  Service: General;  Laterality: Right;   TUBAL LIGATION  2005   VULVA Milagros Loll BIOPSY N/A 04/14/2018   Procedure: VULVAR BIOPSIES;  Surgeon: Isabel Caprice, MD;  Location: [facility];  Service: Gynecology;  Laterality: N/A;     OB History    Gravida  1   Para      Term      Preterm      AB      Living        SAB      TAB      Ectopic      Multiple      Live Births               Home Medications    Prior to Admission  medications   Medication Sig Start Date End Date Taking? Authorizing Provider  azithromycin (ZITHROMAX) 600 MG tablet Take 2 tablets (1,200 mg total) by mouth once a week. 02/22/19   [place] Callas, NP  bictegravir-emtricitabine-tenofovir AF (BIKTARVY) 50-200-25 MG TABS tablet Take 1 tablet by mouth daily. Try to take at the same time each day with or without food. 02/22/19   [place] Callas, NP  doxycycline (VIBRAMYCIN) 100 MG capsule Take 1 capsule (100 mg total) by mouth 2 (two) times daily. 04/05/19   [place] Sherr, Bea Graff, PA-C  DULoxetine (CYMBALTA) 30 MG capsule Take 1 capsule (30 mg total) by mouth daily for 7 days, THEN 2 capsules (60 mg total) daily. Patient not taking: Reported on 01/14/2019 05/04/18 09/03/18  [place] Callas, NP  hydrOXYzine (ATARAX/VISTARIL) 25 MG tablet  Take 1 tablet (25 mg total) by mouth 3 (three) times daily as needed. Patient not taking: Reported on 01/14/2019 03/10/18   [place] Callas, NP  ibuprofen (ADVIL) 200 MG tablet Take 200 mg by mouth every 6 (six) hours as needed for moderate pain.    [provider]  metroNIDAZOLE (FLAGYL) 500 MG tablet Take 1 tablet (500 mg total) by mouth 2 (two) times daily. 04/05/19   Juliany Daughety, Bea Graff, PA-C  naphazoline (NAPHCON) 0.1 % ophthalmic solution Place 1 drop into both eyes 4 (four) times daily as needed for eye irritation. Patient not taking: Reported on 01/14/2019 09/03/18   Robinson, [place] N, PA-C  ondansetron (ZOFRAN) 4 MG tablet Take 1 tablet (4 mg total) by mouth every 6 (six) hours as needed for nausea. 05/26/18   Aline August, MD  sulfamethoxazole-trimethoprim (BACTRIM DS) 800-160 MG tablet Take 1 tablet by mouth daily. 02/22/19    Callas, NP  traZODone (DESYREL) 50 MG tablet Take 1-2 tablets (50-100 mg total) by mouth at bedtime. Patient not taking: Reported on 01/14/2019 04/11/18    Callas, NP    Family History Family History  Problem Relation Age of Onset   Throat cancer Mother        smoker   Throat cancer Father        smoker   Hypertension Father    Cirrhosis Father    Hypertension Other    Cancer Other        smoker   Diabetes Other    Asthma Other     Social History Social History   Tobacco Use   Smoking status: Current Every Day Smoker    Packs/day: 0.10    Years: 3.00    Pack years: 0.30    Types: Cigarettes   Smokeless tobacco: Never Used   Tobacco comment: 1-2 a day  Substance Use Topics   Alcohol use: Yes    Alcohol/week: 0.0 standard drinks    Comment: Ocassionally   Drug use: Yes    Frequency: 2.0 times per week    Types: Marijuana, MDMA (Ecstacy)     Allergies   Bee venom and Morphine and related   Review of Systems Review of Systems  Constitutional: Positive for fever. Negative for chills.  HENT:  Negative for facial swelling and sore throat.   Respiratory: Positive for cough. Negative for shortness of breath.   Cardiovascular: Negative for chest pain.  Gastrointestinal: Positive for abdominal pain, nausea and vomiting. Negative for diarrhea.  Genitourinary: Negative for dysuria.  Musculoskeletal: Negative for back pain.  Skin: Negative for rash and wound.  Neurological: Negative for headaches.  Psychiatric/Behavioral: The patient is not nervous/anxious.  Physical Exam Updated Vital Signs BP 102/61 (BP Location: Left Arm)    Pulse 100    Temp 98.8 F (37.1 C) (Oral)    Resp 20    Ht 5\' 1"  (1.549 m)    Wt 80.7 kg    LMP 03/07/2019 (Approximate)    SpO2 100%    BMI 33.63 kg/m   Physical Exam Vitals signs and nursing note reviewed.  Constitutional:      General: She is not in acute distress.    Appearance: She is well-developed. She is not diaphoretic.  HENT:     Head: Normocephalic and atraumatic.     Mouth/Throat:     Pharynx: No oropharyngeal exudate.  Eyes:     General: No scleral icterus.       Right eye: No discharge.        Left eye: No discharge.     Conjunctiva/sclera: Conjunctivae normal.     Pupils: Pupils are equal, round, and reactive to light.  Neck:     Musculoskeletal: Normal range of motion and neck supple.     Thyroid: No thyromegaly.  Cardiovascular:     Rate and Rhythm: Normal rate and regular rhythm.     Heart sounds: Normal heart sounds. No murmur. No friction rub. No gallop.   Pulmonary:     Effort: Pulmonary effort is normal. No respiratory distress.     Breath sounds: Normal breath sounds. No stridor. No wheezing or rales.  Abdominal:     General: Bowel sounds are normal. There is no distension.     Palpations: Abdomen is soft.     Tenderness: There is abdominal tenderness in the right lower quadrant and suprapubic area. There is guarding. There is no right CVA tenderness, left CVA tenderness or rebound. Positive signs include McBurney's  sign.  Genitourinary:    Comments: White, cheeselike discharge; no palpable nodule measuring 1-1/2 cm in the right labia, tender, no fluctuance; skin is tight and seems indurated on the right suprapubic area Lymphadenopathy:     Cervical: No cervical adenopathy.  Skin:    General: Skin is warm and dry.     Coloration: Skin is not pale.     Findings: No rash.  Neurological:     Mental Status: She is alert.     Coordination: Coordination normal.      ED Treatments / Results  Labs (all labs ordered are listed, but only abnormal results are displayed) Labs Reviewed  WET PREP, GENITAL - Abnormal; Notable for the following components:      Result Value   Clue Cells Wet Prep HPF POC PRESENT (*)    All other components within normal limits  COMPREHENSIVE METABOLIC PANEL - Abnormal; Notable for the following components:   Potassium 2.9 (*)    Glucose, Bld 107 (*)    Calcium 8.5 (*)    Total Protein 8.2 (*)    Albumin 3.4 (*)    All other components within normal limits  CBC WITH DIFFERENTIAL/PLATELET - Abnormal; Notable for the following components:   Hemoglobin 10.6 (*)    HCT 34.4 (*)    RDW 15.7 (*)    Neutro Abs 9.2 (*)    Lymphs Abs 0.5 (*)    All other components within normal limits  URINALYSIS, ROUTINE W REFLEX MICROSCOPIC - Abnormal; Notable for the following components:   Leukocytes,Ua SMALL (*)    All other components within normal limits  URINALYSIS, MICROSCOPIC (REFLEX) - Abnormal; Notable for the following components:  Bacteria, UA FEW (*)    All other components within normal limits  SARS CORONAVIRUS 2 BY RT PCR (HOSPITAL ORDER, [facility])  LACTIC ACID, PLASMA  PREGNANCY, URINE  GC/CHLAMYDIA PROBE AMP ([place]) NOT AT [facility]    EKG None  Radiology Ct Abdomen Pelvis W Contrast  Result Date: 04/05/2019 CLINICAL DATA:  Right lower quadrant pain, fever and nausea. Clinical suspicion for appendicitis. EXAM: CT ABDOMEN AND PELVIS  WITH CONTRAST TECHNIQUE: Multidetector CT imaging of the abdomen and pelvis was performed using the standard protocol following bolus administration of intravenous contrast. CONTRAST:  172mL OMNIPAQUE IOHEXOL 300 MG/ML  SOLN COMPARISON:  CT, 05/25/2018. FINDINGS: Lower chest: No acute abnormality. Hepatobiliary: Liver normal in size. Focal fat adjacent to the falciform ligament. No other liver mass or lesion. Normal gallbladder. No bile duct dilation. Pancreas: Unremarkable. No pancreatic ductal dilatation or surrounding inflammatory changes. Spleen: Normal in size without focal abnormality. Adrenals/Urinary Tract: No adrenal masses. Kidneys are normal in size, orientation and position with symmetric enhancement. There are bilateral nonobstructing intrarenal stones. Low attenuation 7-8 mm mass, lower pole the right kidney, consistent with a cyst. No other renal masses. No hydronephrosis. Ureters are normal in course and in caliber. Bladder is unremarkable. Stomach/Bowel: Normal stomach. Small bowel and colon are normal in caliber. No wall thickening. No inflammation. Appendix measures 6-7 mm in diameter, borderline dilated, but demonstrates no adjacent inflammation. Vascular/Lymphatic: There are enlarged pelvic and inguinal lymph nodes, most evident on the right. In the right inguinal region, largest node measures 1.4 cm in short axis. There are right and left external iliac chain lymph nodes. On the right, the largest node measures 16 mm in short axis. On the left, the largest iliac chain lymph nodes measuring 1 cm short axis. There are smaller, but prominent, common iliac chain nodes. No vascular abnormality. Reproductive: Uterus unremarkable. Cervix is somewhat ill-defined. No adnexal masses. Other: Trace pelvic free fluid.  No abdominal wall hernia. Musculoskeletal: No acute or significant osseous findings. IMPRESSION: 1. Appendix is prominent, 6-7 mm in diameter, but shows no adjacent inflammation. This is felt  to be a normal variant in this patient. No convincing appendicitis. 2. There are multiple prominent and enlarged inguinal and pelvic lymph nodes, largest nodes on the right. This represents a change from the prior CT. These may be reactive, but neoplastic disease is not excluded. This accounts for the patient's right groin pain. 3. Bilateral nonobstructing intrarenal stones. Electronically Signed   By: Lajean Manes M.D.   On: 04/05/2019 20:15   Dg Chest Portable 1 View  Result Date: 04/05/2019 CLINICAL DATA:  Cough, fever, right lower quadrant pain with history of COVID-19 in September. EXAM: PORTABLE CHEST 1 VIEW COMPARISON:  December of 2019 FINDINGS: Prominence of pulmonary artery outflow tract with prominence of central pulmonary vasculature, no sign of edema, consolidation or pleural effusion. Heart size accentuated by AP technique. No acute bone finding. IMPRESSION: Question of findings the may be associated with pulmonary arterial hypertension, consider correlation with echocardiography as clinically warranted. No signs of consolidation or effusion. Electronically Signed   By: Zetta Bills M.D.   On: 04/05/2019 17:27    Procedures Procedures (including critical care time)  Medications Ordered in ED Medications  ondansetron (ZOFRAN) injection 4 mg (4 mg Intravenous Given 04/05/19 1754)  fentaNYL (SUBLIMAZE) injection 50 mcg (50 mcg Intravenous Given 04/05/19 1754)  LORazepam (ATIVAN) injection 0.5 mg (0.5 mg Intravenous Given 04/05/19 1754)  acetaminophen (TYLENOL) tablet 650  mg (650 mg Oral Given 04/05/19 1755)  potassium chloride 10 mEq in 100 mL IVPB ( Intravenous Stopped 04/05/19 2314)  iohexol (OMNIPAQUE) 300 MG/ML solution 100 mL (100 mLs Intravenous Contrast Given 04/05/19 1947)  cefTRIAXone (ROCEPHIN) injection 250 mg (250 mg Intramuscular Given 04/05/19 2151)  doxycycline (VIBRA-TABS) tablet 100 mg (100 mg Oral Given 04/05/19 2151)  albuterol (VENTOLIN HFA) 108 (90 Base)  MCG/ACT inhaler 4 puff (4 puffs Inhalation Given 04/05/19 2159)  potassium chloride SA (KLOR-CON) CR tablet 40 mEq (40 mEq Oral Given 04/05/19 2309)     Initial Impression / Assessment and Plan / ED Course  I have reviewed the triage vital signs and the nursing notes.  Pertinent labs & imaging results that were available during my care of the patient were reviewed by me and considered in my medical decision making (see chart for details).        Patient presenting with right lower quadrant pain with 1 episode of vomiting.  She has had chills, but no documented fever.  Patient is having groin pain.  There is palpable nodule right labia majora that is tender.  No fluctuance.  CT shows prominent appendix, but no adjacent inflammation and felt to be a normal variant, no convincing appendicitis.  There are multiple prominent and enlarged inguinal pelvic lymph nodes largest on the right.  These could be reactive, but neoplastic disease not excluded.  Patient reports recent treatment for cervical cancer earlier this year.  I advised her of this finding and that it is very important that she follow-up for further evaluation.  We will treat with Rocephin and doxycycline to cover for STD considering discharge and tenderness with pelvic exam.  This will cover a skin infection of the labia as well.  Close follow-up with PCP discussed.  Patient feeling improved.  Potassium replaced and patient given IV fluids.  Will discharge home with doxycycline and Flagyl for BV.  Return precautions discussed.  Patient understands and agrees with plan.  Patient vitals stable throughout ED course and discharged in satisfactory condition. I discussed patient case with Dr. Tyrone Nine who guided the patient's management and agrees with plan.   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

## 2019-04-05 NOTE — ED Triage Notes (Addendum)
Sudden onset fever at 1500 abd and rt groin pain, denies vag dc  Denies ua sx states cough onset last pm

## 2019-04-07 LAB — GC/CHLAMYDIA PROBE AMP (~~LOC~~) NOT AT ARMC
Chlamydia: NEGATIVE
Neisseria Gonorrhea: NEGATIVE

## 2019-04-12 ENCOUNTER — Telehealth: Payer: Self-pay | Admitting: *Deleted

## 2019-04-12 NOTE — Telephone Encounter (Signed)
Per note from [place] called patient to try and get her in for an office visit.had to leave a message for her to call the office once she was available.    [place] Callas, NP  Reggy Eye, CMA        Can you please help me try to get Beverly [name] in here for a visit? She went to the ER for abdominal/pelvic pain and has a ton of swollen lymph nodes in her pelvis...Marland KitchenMarland Kitchen she never underwent the LEEP procedure for severe cervical abnormalities and I fear she has progressed further to cervical/vaginal cancer given what I see on CT......    Thank you kindly. I worry about her a lot.

## 2019-12-06 ENCOUNTER — Other Ambulatory Visit: Payer: Self-pay

## 2019-12-06 ENCOUNTER — Encounter (HOSPITAL_COMMUNITY): Payer: Self-pay

## 2019-12-06 ENCOUNTER — Emergency Department (HOSPITAL_COMMUNITY)
Admission: EM | Admit: 2019-12-06 | Discharge: 2019-12-07 | Disposition: A | Discharge status: Home / Self Care | Payer: Medicaid Other | Attending: Emergency Medicine | Admitting: Emergency Medicine

## 2019-12-06 DIAGNOSIS — F1210 Cannabis abuse, uncomplicated: Secondary | ICD-10-CM | POA: Insufficient documentation

## 2019-12-06 DIAGNOSIS — N898 Other specified noninflammatory disorders of vagina: Secondary | ICD-10-CM | POA: Insufficient documentation

## 2019-12-06 DIAGNOSIS — F17210 Nicotine dependence, cigarettes, uncomplicated: Secondary | ICD-10-CM | POA: Diagnosis not present

## 2019-12-06 DIAGNOSIS — J45909 Unspecified asthma, uncomplicated: Secondary | ICD-10-CM | POA: Diagnosis not present

## 2019-12-06 DIAGNOSIS — B20 Human immunodeficiency virus [HIV] disease: Secondary | ICD-10-CM

## 2019-12-06 LAB — POC URINE PREG, ED: Preg Test, Ur: NEGATIVE

## 2019-12-06 NOTE — ED Triage Notes (Signed)
Arrived POV. Patient has multiple complaints from green vaginal discharge to blisters on her ankles. Patient states she has not been taking her HIV medication because she has been depressed. Denies SI/HI or wanting to harm herself.

## 2019-12-06 NOTE — ED Provider Notes (Signed)
[facility] DEPT Provider Note   CSN: [id number] Arrival date & time: 12/06/19  2007     History Chief Complaint  Patient presents with  . Multiple Complaints    YESLY GERETY is a 39 y.o. female.  The history is provided by the patient.  Vaginal Discharge Quality:  Green Severity:  Mild Onset quality:  Sudden Timing:  Rare Progression:  Resolved Chronicity:  New Context: spontaneously   Relieved by:  Nothing Worsened by:  Nothing Ineffective treatments:  None tried Associated symptoms: no abdominal pain, no fever, no nausea, no urinary frequency, no urinary hesitancy, no urinary incontinence, no vaginal itching and no vomiting   Risk factors: no endometriosis, no STI and no unprotected sex        Past Medical History:  Diagnosis Date  . Abscess   . AIDS ([place]) 03/19/2015  . Anxiety   . Asthma   . Blood transfusion without reported diagnosis   . Depression   . HIV infection ([place])   . Homeless 03/19/2015   states stays with family or friends. Does not rent or own a home, but does not live on streets.  . 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 Leg lesion 03/19/2015  . Major depression, recurrent ([place]) 03/19/2015  . 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 Skin ulcer ([place]) 05/06/2015    Patient Active Problem List   Diagnosis Date Noted  . Pyelonephritis 05/25/2018  . Nausea & vomiting 05/25/2018  . Ulceration, vulva 03/10/2018  . Itching 03/10/2018  . 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 Pancytopenia ([place]) 01/09/2016  . Major depression, recurrent ([place]) 03/19/2015  . Homeless 03/19/2015  . 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 Depression 10/17/2013  . Anemia 10/17/2013  . Thrombocytopenia ([place]) 10/17/2013  . Cigarette smoker 10/17/2013  . Asthma 10/17/2013  . Encounter for general adult medical examination without abnormal findings 06/12/2011  . 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 Anxiety and  depression 07/17/2008  . AIDS (acquired immune deficiency syndrome) ([place]) 06/28/2008    Past Surgical History:  Procedure Laterality Date  . CERVICAL CONIZATION W/BX N/A 04/14/2018   Procedure: COLD KNIFE CONIZATION CERVIX WITH BIOPSY;  Surgeon: Isabel Caprice, MD;  Location[facility];  Service: Gynecology;  Laterality: N/A;  . CESAREAN SECTION    . DILATION AND CURETTAGE OF UTERUS N/A 04/14/2018   Procedure: ENDOCERVICAL CURETTAGE;  Surgeon: Isabel Caprice, MD;  Location: [facility];  Service: Gynecology;  Laterality: N/A;  . INCISION AND DRAINAGE ABSCESS Right 01/10/2016   Procedure: INCISION AND DRAINAGE RIGHT BUTTOCK, LEFT LABIAL , EXCISION AND DRAINAGE OF  RIGHT AXILLARY ABSCESS;  Surgeon: Excell Seltzer, MD;  Location: [facility];  Service: General;  Laterality: Right;  . TUBAL LIGATION  2005  . VULVA Milagros Loll BIOPSY N/A 04/14/2018   Procedure: VULVAR BIOPSIES;  Surgeon: Isabel Caprice, MD;  Location: [facility];  Service: Gynecology;  Laterality: N/A;     OB History    Gravida  1   Para      Term      Preterm      AB      Living        SAB      TAB      Ectopic      Multiple      Live Births  Family History  Problem Relation Age of Onset  . Throat cancer Mother        smoker  . Throat cancer Father        smoker  . Hypertension Father   . Cirrhosis Father   . Hypertension Other   . Cancer Other        smoker  . Diabetes Other   . Asthma Other     Social History   Tobacco Use  . Smoking status: Current Every Day Smoker    Packs/day: 0.10    Years: 3.00    Pack years: 0.30    Types: Cigarettes  . Smokeless tobacco: Never Used  . Tobacco comment: 1-2 a day  Vaping Use  . Vaping Use: Never used  Substance Use Topics  . Alcohol use: Yes    Alcohol/week: 0.0 standard drinks    Comment: Ocassionally  . Drug use: Yes    Frequency: 2.0 times per week    Types: Marijuana,  MDMA (Ecstacy)    Home Medications Prior to Admission medications   Medication Sig Start Date End Date Taking? Authorizing Provider  azithromycin (ZITHROMAX) 600 MG tablet Take 2 tablets (1,200 mg total) by mouth once a week. 02/22/19   [place] Callas, NP  bictegravir-emtricitabine-tenofovir AF (BIKTARVY) 50-200-25 MG TABS tablet Take 1 tablet by mouth daily. Try to take at the same time each day with or without food. 02/22/19   [place] Callas, NP  doxycycline (VIBRAMYCIN) 100 MG capsule Take 1 capsule (100 mg total) by mouth 2 (two) times daily. 04/05/19   Law, Bea Graff, PA-C  DULoxetine (CYMBALTA) 30 MG capsule Take 1 capsule (30 mg total) by mouth daily for 7 days, THEN 2 capsules (60 mg total) daily. Patient not taking: Reported on 01/14/2019 05/04/18 09/03/18  [place] Callas, NP  hydrOXYzine (ATARAX/VISTARIL) 25 MG tablet Take 1 tablet (25 mg total) by mouth 3 (three) times daily as needed. Patient not taking: Reported on 01/14/2019 03/10/18   [place] Callas, NP  ibuprofen (ADVIL) 200 MG tablet Take 200 mg by mouth every 6 (six) hours as needed for moderate pain.    [provider]  metroNIDAZOLE (FLAGYL) 500 MG tablet Take 1 tablet (500 mg total) by mouth 2 (two) times daily. 04/05/19   Law, Bea Graff, PA-C  naphazoline (NAPHCON) 0.1 % ophthalmic solution Place 1 drop into both eyes 4 (four) times daily as needed for eye irritation. Patient not taking: Reported on 01/14/2019 09/03/18   Robinson, [place] N, PA-C  ondansetron (ZOFRAN) 4 MG tablet Take 1 tablet (4 mg total) by mouth every 6 (six) hours as needed for nausea. 05/26/18   Aline August, MD  sulfamethoxazole-trimethoprim (BACTRIM DS) 800-160 MG tablet Take 1 tablet by mouth daily. 02/22/19   [place] Callas, NP  traZODone (DESYREL) 50 MG tablet Take 1-2 tablets (50-100 mg total) by mouth at bedtime. Patient not taking: Reported on 01/14/2019 04/11/18   [place] Callas, NP    Allergies    Bee venom and  Morphine and related  Review of Systems   Review of Systems  Constitutional: Negative for fever.  HENT: Negative for congestion.   Eyes: Negative for visual disturbance.  Respiratory: Negative for shortness of breath.   Cardiovascular: Negative for chest pain.  Gastrointestinal: Negative for abdominal pain, nausea and vomiting.  Genitourinary: Positive for vaginal discharge. Negative for bladder incontinence and hesitancy.  Musculoskeletal: Negative for arthralgias.  Skin: Negative for pallor.  Neurological: Negative for dizziness.  Psychiatric/Behavioral: Negative for agitation.  All other systems reviewed and are negative.   Physical Exam Updated Vital Signs BP 121/84 (BP Location: Right Arm)   Pulse 97   Temp 98.5 F (36.9 C) (Oral)   Resp 17   LMP 11/07/2019   SpO2 100%   Physical Exam Vitals and nursing note reviewed.  Constitutional:      General: She is not in acute distress.    Appearance: Normal appearance.  HENT:     Head: Normocephalic and atraumatic.     Nose: Nose normal.  Eyes:     Conjunctiva/sclera: Conjunctivae normal.     Pupils: Pupils are equal, round, and reactive to light.  Cardiovascular:     Rate and Rhythm: Normal rate and regular rhythm.     Pulses: Normal pulses.     Heart sounds: Normal heart sounds.  Pulmonary:     Effort: Pulmonary effort is normal.     Breath sounds: Normal breath sounds.  Abdominal:     General: Abdomen is flat. Bowel sounds are normal.     Tenderness: There is no abdominal tenderness. There is no guarding.  Genitourinary:    General: Normal vulva.     Vagina: No vaginal discharge.  Musculoskeletal:        General: Normal range of motion.     Cervical back: Normal range of motion and neck supple.  Skin:    General: Skin is warm and dry.     Capillary Refill: Capillary refill takes less than 2 seconds.  Neurological:     General: No focal deficit present.     Mental Status: She is alert and oriented to person,  place, and time.     Deep Tendon Reflexes: Reflexes normal.  Psychiatric:        Mood and Affect: Mood normal.        Behavior: Behavior normal.     ED Results / Procedures / Treatments   Labs (all labs ordered are listed, but only abnormal results are displayed) Results for orders placed or performed during the hospital encounter of 12/06/19  Wet prep, genital  Result Value Ref Range   Yeast Wet Prep HPF POC NONE SEEN NONE SEEN   Trich, Wet Prep NONE SEEN NONE SEEN   Clue Cells Wet Prep HPF POC NONE SEEN NONE SEEN   WBC, Wet Prep HPF POC NONE SEEN NONE SEEN   Sperm NONE SEEN   POC Urine Pregnancy, ED  (if pt is a pre-menopausal female)  - NOT at [facility]  Result Value Ref Range   Preg Test, Ur NEGATIVE NEGATIVE   No results found.  EKG None  Radiology No results found.  Procedures Procedures (including critical care time)   ED Course  I have reviewed the triage vital signs and the nursing notes.  Pertinent labs & imaging results that were available during my care of the patient were reviewed by me and considered in my medical decision making (see chart for details).  Scant white discharge. No signs of abscess.  States umbilicus is also draining.  No wounds seen, no cellulitis.  No abscesses.  Will start mupirocin and refer to infectious disease who she has not seen in more than 7 months.    [name] was evaluated in Emergency Department on 12/06/2019 for the symptoms described in the history of present illness. She was evaluated in the context of the global COVID-19 pandemic, which necessitated consideration that the patient might be at risk for infection  with the SARS-CoV-2 virus that causes COVID-19. Institutional protocols and algorithms that pertain to the evaluation of patients at risk for COVID-19 are in a state of rapid change based on information released by regulatory bodies including the CDC and federal and state organizations. These policies and algorithms were  followed during the patient's care in the ED.  Final Clinical Impression(s) / ED Diagnoses Return for intractable cough, coughing up blood,fevers >100.4 unrelieved by medication, shortness of breath, intractable vomiting, chest pain, shortness of breath, weakness,numbness, changes in speech, facial asymmetry,abdominal pain, passing out,Inability to tolerate liquids or food, cough, altered mental status or any concerns. No signs of systemic illness or infection. The patient is nontoxic-appearing on exam and vital signs are within normal limits.   I have reviewed the triage vital signs and the nursing notes. Pertinent labs &imaging results that were available during my care of the patient were reviewed by me and considered in my medical decision making (see chart for details).After history, exam, and medical workup I feel the patient has beenappropriately medically screened and is safe for discharge home. Pertinent diagnoses were discussed with the patient. Patient was given return precautions.   Eustace Hur, MD 12/07/19 [id number]

## 2019-12-07 LAB — WET PREP, GENITAL
Clue Cells Wet Prep HPF POC: NONE SEEN
Sperm: NONE SEEN
Trich, Wet Prep: NONE SEEN
WBC, Wet Prep HPF POC: NONE SEEN
Yeast Wet Prep HPF POC: NONE SEEN

## 2019-12-07 MED ORDER — MUPIROCIN CALCIUM 2 % EX CREA: 1.0000 "application " | TOPICAL_CREAM | Freq: Two times a day (BID) | CUTANEOUS | 0 refills | Status: DC

## 2019-12-08 ENCOUNTER — Telehealth: Payer: Self-pay

## 2019-12-08 LAB — GC/CHLAMYDIA PROBE AMP (~~LOC~~) NOT AT ARMC
Chlamydia: NEGATIVE
Comment: NEGATIVE
Comment: NORMAL
Neisseria Gonorrhea: NEGATIVE

## 2019-12-08 NOTE — Telephone Encounter (Signed)
Attempted to call patient to schedule appointment per Janene Madeira, NP. Patient will need 30 min appointment with provider. Left voicemail requesting a call back.  [place] Callas, NP  [place], [place], [place] will you please call Dunya to offer her an appointment with me if she would like to get back into care.   We have not seen her in about a year and a half and I worry about how she is doing - several ER visits.   If she agrees will you schedule 30 min appt please?   Thank you        Aundria Rud, CMA

## 2019-12-19 ENCOUNTER — Telehealth: Payer: Self-pay | Admitting: Pharmacy Technician

## 2019-12-19 ENCOUNTER — Ambulatory Visit: Payer: Medicaid Other | Admitting: Infectious Diseases

## 2019-12-19 ENCOUNTER — Ambulatory Visit: Payer: Medicaid Other | Admitting: Pharmacist

## 2019-12-19 NOTE — Telephone Encounter (Signed)
[name] Teacher, English as a foreign language completed.    The patient is insured through [employer] and has a $3 copay.    Beverly [name]. Beverly [name] Patient [facility] Phone: [phone] Fax:  [phone]

## 2019-12-26 ENCOUNTER — Ambulatory Visit (INDEPENDENT_AMBULATORY_CARE_PROVIDER_SITE_OTHER): Payer: Medicaid Other | Admitting: Infectious Diseases

## 2019-12-26 ENCOUNTER — Other Ambulatory Visit: Payer: Self-pay

## 2019-12-26 ENCOUNTER — Encounter: Payer: Self-pay | Admitting: Infectious Diseases

## 2019-12-26 VITALS — BP 116/78 | HR 99 | Temp 98.0°F | Ht 61.0 in | Wt 133.0 lb

## 2019-12-26 DIAGNOSIS — Z5900 Homelessness unspecified: Secondary | ICD-10-CM

## 2019-12-26 DIAGNOSIS — B20 Human immunodeficiency virus [HIV] disease: Secondary | ICD-10-CM | POA: Diagnosis not present

## 2019-12-26 DIAGNOSIS — D069 Carcinoma in situ of cervix, unspecified: Secondary | ICD-10-CM

## 2019-12-26 DIAGNOSIS — F329 Major depressive disorder, single episode, unspecified: Secondary | ICD-10-CM | POA: Diagnosis not present

## 2019-12-26 DIAGNOSIS — N766 Ulceration of vulva: Secondary | ICD-10-CM

## 2019-12-26 DIAGNOSIS — Z85828 Personal history of other malignant neoplasm of skin: Secondary | ICD-10-CM | POA: Diagnosis not present

## 2019-12-26 DIAGNOSIS — F32A Depression, unspecified: Secondary | ICD-10-CM

## 2019-12-26 MED ORDER — DOXYCYCLINE HYCLATE 100 MG PO TABS: 100.0000 mg | ORAL_TABLET | Freq: Two times a day (BID) | ORAL | 0 refills | Status: AC

## 2019-12-26 MED ORDER — SULFAMETHOXAZOLE-TRIMETHOPRIM 800-160 MG PO TABS: 1.0000 | ORAL_TABLET | Freq: Every day | ORAL | 11 refills | Status: DC

## 2019-12-26 MED ORDER — FLUCONAZOLE 200 MG PO TABS: 200.0000 mg | ORAL_TABLET | Freq: Every day | ORAL | 0 refills | Status: DC

## 2019-12-26 MED ORDER — BIKTARVY 50-200-25 MG PO TABS: 1.0000 | ORAL_TABLET | Freq: Every day | ORAL | 11 refills | Status: DC

## 2019-12-26 NOTE — Assessment & Plan Note (Signed)
Ongoing problem for Beverly [name] that has spanned many years from review of ID records. Will get her set up with an appointment with Marcie Bal at her ability.  Referred to [facility] re-engagement today who met with her at the visit today.

## 2019-12-26 NOTE — Progress Notes (Signed)
Patient is interested in making an appointment with Marcie Bal.  Patient met with THP and Michelle.

## 2019-12-26 NOTE — Assessment & Plan Note (Signed)
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 She never went through with recommended LEEP from what I can see.  At upcoming visit will discuss repeating her abdominal / pelvic CT scan. This will also allow [place] to look for visceral KS.

## 2019-12-26 NOTE — Progress Notes (Signed)
Name: Beverly [name]  DOB: May 07, 1981 MRN: [id number] PCP:  Callas, NP    Patient Active Problem List   Diagnosis Date Noted  . Nausea & vomiting 05/25/2018  . Ulceration, vulva 03/10/2018  . Itching 03/10/2018  . 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 Pancytopenia ([place]) 01/09/2016  . Homeless 03/19/2015  . 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 Depression 10/17/2013  . Anemia 10/17/2013  . Thrombocytopenia ([place]) 10/17/2013  . Cigarette smoker 10/17/2013  . Asthma 10/17/2013  . Encounter for general adult medical examination without abnormal findings 06/12/2011  . 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 Anxiety and depression 07/17/2008  . AIDS (acquired immune deficiency syndrome) ([place]) 06/28/2008     Brief Narrative:  Beverly [name] is a 39 y.o. female with HIV disease, (+) AIDS. Dx ~2005.  CD4 nadir < 35 HIV Risk: heterosexual  History of OIs:  Intake Labs : Hep B sAg (- 2010), sAb (- 2010), cAb (- 2010); Hep A (), Hep C (- 2010) Quantiferon () HLA B*[id number] (-) G6PD: ()   Previous Regimens: . Atripla . Kaletra + Combivir  . Darunavir + Ritonavir + Truvada  . Tivicay + Descovy [id number] . Biktarvy    Genotypes: . Previously wildtype  . 12-2019: pending  Subjective:  CC: Multiple concerns today -  Draining skin lesions, black skin bumps, depression / anxiety, homeless, off medications, fevers     HPI: Beverly [name] has been off her Biktarvy since December 2020. While she was on her medication regularly she was feeling better and regained some weight. She stopped due to all the external stressors she has been experiencing at home. She has lost about 40 lbs since last May. Her appetite has decreased significantly and she hasn't eaten over the last 2 days.  She also reports intermittent fevers periodically. Not clear how high they get but she gets chills and sweating episodes.  Has been to the ER a few times for various things - most  recently draining skin lesions.   She has multiple skin problems now - she has swelling to the mons pubis area and opened ulcerated areas to bilateral inguinal fold. She needed one of them lanced previously. She notes greenish discharge on her panty liner and cannot tell if she has this from vagina or one of the skin lesions.   She is tearful, depressed and anxious. She talks a lot about chaotic life events recently that have impacted her adherence to medications. Currently living in [facility] and looking into shelters locally.  She is very worried about how ill she may be.    Depression screen PHQ 2/9 12/26/2019  Decreased Interest 1  Down, Depressed, Hopeless 2  PHQ - 2 Score 3  Altered sleeping 1  Tired, decreased energy 1  Change in appetite 1  Feeling bad or failure about yourself  0  Trouble concentrating 2  Moving slowly or fidgety/restless 0  Suicidal thoughts 0  PHQ-9 Score 8  Difficult doing work/chores Somewhat difficult    Review of Systems  Constitutional: Positive for chills, fever and weight loss. Negative for malaise/fatigue.  Respiratory: Negative for cough, sputum production and shortness of breath.   Cardiovascular: Negative for chest pain and leg swelling.  Gastrointestinal: Negative for abdominal pain, blood in stool, diarrhea, nausea and vomiting.       Anorexia   Genitourinary: Negative for dysuria and frequency.  Musculoskeletal: Negative for back pain, joint pain, myalgias and neck pain.  Skin: Positive for itching and rash.  Neurological: Negative for headaches.  Psychiatric/Behavioral: Positive for depression. Negative for substance abuse. The patient is nervous/anxious and has insomnia.     Past Medical History:  Diagnosis Date  . Abscess   . AIDS ([place]) 03/19/2015  . Anxiety   . Asthma   . Blood transfusion without reported diagnosis   . Depression   . HIV infection ([place])   . Homeless 03/19/2015   states stays with family or friends. Does not  rent or own a home, but does not live on streets.  . Kaposi's sarcoma ([place]) 05/11/2016  . Leg lesion 03/19/2015  . Major depression, recurrent ([place]) 03/19/2015  . 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 Skin ulcer ([place]) 05/06/2015    Outpatient Medications Prior to Visit  Medication Sig Dispense Refill  . azithromycin (ZITHROMAX) 600 MG tablet Take 2 tablets (1,200 mg total) by mouth once a week. 8 tablet 3  . bictegravir-emtricitabine-tenofovir AF (BIKTARVY) 50-200-25 MG TABS tablet Take 1 tablet by mouth daily. Try to take at the same time each day with or without food. 30 tablet 3  . doxycycline (VIBRAMYCIN) 100 MG capsule Take 1 capsule (100 mg total) by mouth 2 (two) times daily. 20 capsule 0  . hydrOXYzine (ATARAX/VISTARIL) 25 MG tablet Take 1 tablet (25 mg total) by mouth 3 (three) times daily as needed. 30 tablet 0  . ibuprofen (ADVIL) 200 MG tablet Take 200 mg by mouth every 6 (six) hours as needed for moderate pain.    . metroNIDAZOLE (FLAGYL) 500 MG tablet Take 1 tablet (500 mg total) by mouth 2 (two) times daily. 14 tablet 0  . mupirocin cream (BACTROBAN) 2 % Apply 1 application topically 2 (two) times daily. 15 g 0  . naphazoline (NAPHCON) 0.1 % ophthalmic solution Place 1 drop into both eyes 4 (four) times daily as needed for eye irritation. 15 mL 0  . ondansetron (ZOFRAN) 4 MG tablet Take 1 tablet (4 mg total) by mouth every 6 (six) hours as needed for nausea. 20 tablet 0  . sulfamethoxazole-trimethoprim (BACTRIM DS) 800-160 MG tablet Take 1 tablet by mouth daily. 30 tablet 3  . traZODone (DESYREL) 50 MG tablet Take 1-2 tablets (50-100 mg total) by mouth at bedtime. 60 tablet 2  . DULoxetine (CYMBALTA) 30 MG capsule Take 1 capsule (30 mg total) by mouth daily for 7 days, THEN 2 capsules (60 mg total) daily. (Patient not taking: Reported on 01/14/2019) 60 capsule 1   No facility-administered medications prior to visit.     Allergies  Allergen Reactions  . Bee  Venom Anaphylaxis, Shortness Of Breath and Swelling  . Morphine And Related Hives and Itching    Just can't take "morphine", vicodin is OK    Social History   Tobacco Use  . Smoking status: Current Every Day Smoker    Packs/day: 0.10    Years: 3.00    Pack years: 0.30    Types: Cigarettes  . Smokeless tobacco: Never Used  . Tobacco comment: 1-2 a day  Vaping Use  . Vaping Use: Never used  Substance Use Topics  . Alcohol use: Yes    Alcohol/week: 0.0 standard drinks    Comment: Ocassionally  . Drug use: Yes    Frequency: 2.0 times per week    Types: Marijuana, MDMA (Ecstacy)    Family History  Problem Relation Age of Onset  . Throat cancer Mother  smoker  . Throat cancer Father        smoker  . Hypertension Father   . Cirrhosis Father   . Hypertension Other   . Cancer Other        smoker  . Diabetes Other   . Asthma Other     Social History   Substance and Sexual Activity  Sexual Activity Yes  . Partners: Male  . Birth control/protection: Condom, Surgical   Comment: pt. given condoms     Objective:   Vitals:   12/26/19 1103  BP: 116/78  Pulse: 99  Temp: 98 F (36.7 C)  Weight: 133 lb (60.3 kg)  Height: '5\' 1"'$  (1.549 m)   Body mass index is 25.13 kg/m.  Physical Exam Constitutional:      General: She is not in acute distress.    Appearance: She is ill-appearing.  HENT:     Mouth/Throat:     Mouth: Mucous membranes are moist.     Pharynx: Oropharynx is clear. No oropharyngeal exudate.  Eyes:     General: No scleral icterus.    Pupils: Pupils are equal, round, and reactive to light.  Cardiovascular:     Rate and Rhythm: Regular rhythm. Tachycardia present.     Pulses: Normal pulses.     Heart sounds: No murmur heard.   Pulmonary:     Effort: Pulmonary effort is normal.     Breath sounds: Normal breath sounds.  Abdominal:     Palpations: Abdomen is soft.     Tenderness: There is abdominal tenderness (lower abdomen and pubic area).        Comments: Skin is macerated with greyish appearance. Few scattered opened ulcerations without induration or fluctuance.  Her mons pubis is swollen as is labia today. No evidence of formed abscess that I can palpate. No induration or warmth.   Genitourinary:    Pubic Area: Rash present.       Comments: Areas of chronic ulcerations. Macerated skin with greyish appearance. Firm indurated skin but no obvious formed abscess - exam is limited due to patient's discomfort. No purulence noted to folds from what I can see.  Lymphadenopathy:     Cervical: No cervical adenopathy.  Skin:    Capillary Refill: Capillary refill takes less than 2 seconds.     Comments: See photos of dark nodular / ulcerated appearing lesions.  Multiple small abscesses along legs, right flank and at bra line. No purulence expressed from them and not overly inflamed appearing today but tender.   Neurological:     Mental Status: She is alert and oriented to person, place, and time.  Psychiatric:     Comments: Tearful, anxious          Lab Results Lab Results  Component Value Date   WBC 10.0 04/05/2019   HGB 10.6 (L) 04/05/2019   HCT 34.4 (L) 04/05/2019   MCV 86.0 04/05/2019   PLT 215 04/05/2019    Lab Results  Component Value Date   CREATININE 0.73 04/05/2019   BUN 11 04/05/2019   NA 137 04/05/2019   K 2.9 (L) 04/05/2019   CL 105 04/05/2019   CO2 22 04/05/2019    Lab Results  Component Value Date   ALT 18 04/05/2019   AST 26 04/05/2019   ALKPHOS 77 04/05/2019   BILITOT 1.1 04/05/2019    Lab Results  Component Value Date   CHOL 146 06/10/2016   HDL 72 06/10/2016   LDLCALC 63 06/10/2016   TRIG  56 06/10/2016   CHOLHDL 2.0 06/10/2016   HIV 1 RNA Quant (copies/mL)  Date Value  04/11/2018 95 (H)  06/10/2016 49 (H)  01/09/2016 338,281 (H)   CD4 T Cell Abs (/uL)  Date Value  04/06/2018 20 (L)  11/11/2016 51 (L)  06/10/2016 10 (L)     Assessment & Plan:   Problem List Items  Addressed This Visit      Unprioritized   AIDS (acquired immune deficiency syndrome) ([place])    HIV 1 RNA Quant (copies/mL)  Date Value  04/11/2018 95 (H)  06/10/2016 49 (H)  01/09/2016 338,281 (H)   CD4 T Cell Abs (/uL)  Date Value  04/06/2018 20 (L)  11/11/2016 51 (L)  06/10/2016 10 (L)   Last labs in October 2019 indicated she was taking her Biktarvy with VL 95. Her CD4 count never improved beyond 50 however. No concern over possible CNS OI today so feel OK to resume her Biktarvy. Will check genotype with integrase assessment today as well as CD4 count and other pertinent labs.  She will resume Bactrim daily  Will see if we can get her to take Azithro weekly at upcoming [place] after acute skin problems resolve.  RTC in 3 weeks.       Relevant Medications   sulfamethoxazole-trimethoprim (BACTRIM DS) 800-160 MG tablet   bictegravir-emtricitabine-tenofovir AF (BIKTARVY) 50-200-25 MG TABS tablet   fluconazole (DIFLUCAN) 200 MG tablet   CIN III with severe dysplasia    I worry her labia edema is reflective of progression of severe cervical / vaginal dysplasia. She never went through with recommended LEEP from what I can see.  At upcoming visit will discuss repeating her abdominal / pelvic CT scan. This will also allow [place] to look for visceral KS.       Depression    Ongoing problem for Beverly [name] that has spanned many years from review of ID records. Will get her set up with an appointment with Marcie Bal at her ability.  Referred to [facility] re-engagement today who met with her at the visit today.       Homeless    Referred to [facility] today. Was previously working with [facility] [employer] [employer] but declined further partnership at this time.       History of Kaposi's sarcoma of skin    She has return of several dark Leyva/black nodules on the skin c/w Kaposi's Sarcoma. Will resume her ARVs now. Plan to treat acute skin fungal/bacterial issues then consider referral for biopsy.  Planning abdominal /  pelvic CT scan to also assess possibility of visceral involvement.       Ulceration, vulva    She appears to have superimposed fungal infection involving chronically macerated / ulcerated tissue along underside of panus and b/l inguinal folds. Will treat with doxycycline BID x 14d with multiple scattered skin abscesses present today and fluconazole 200 mg QD x 7d for fungal associated rash. Keep clean with soap and water, pat dry and use absorbant pad to prevent skin on skin.        Other Visit Diagnoses    Symptomatic HIV infection ([place])    -  Primary   Relevant Medications   sulfamethoxazole-trimethoprim (BACTRIM DS) 800-160 MG tablet   bictegravir-emtricitabine-tenofovir AF (BIKTARVY) 50-200-25 MG TABS tablet   fluconazole (DIFLUCAN) 200 MG tablet   Other Relevant Orders   HIV RNA, RTPCR W/R GT (RTI, PI,INT)   T-helper cell (CD4)- ([facility] clinic only)   RPR   COMPLETE METABOLIC PANEL  WITH GFR   CBC with Differential/Platelet      Janene Madeira, MSN, NP-C [place] [facility] [place] Pager: [phone] Office: [phone]  12/26/19  1:35 PM

## 2019-12-26 NOTE — Assessment & Plan Note (Signed)
HIV 1 RNA Quant (copies/mL)  Date Value  04/11/2018 95 (H)  06/10/2016 49 (H)  01/09/2016 338,281 (H)   CD4 T Cell Abs (/uL)  Date Value  04/06/2018 20 (L)  11/11/2016 51 (L)  06/10/2016 10 (L)   Last labs in October 2019 indicated she was taking her Biktarvy with VL 95. Her CD4 count never improved beyond 50 however. No concern over possible CNS OI today so feel OK to resume her Biktarvy. Will check genotype with integrase assessment today as well as CD4 count and other pertinent labs.  She will resume Bactrim daily  Will see if we can get her to take Azithro weekly at upcoming [place] after acute skin problems resolve.  RTC in 3 weeks.

## 2019-12-26 NOTE — Assessment & Plan Note (Signed)
She appears to have superimposed fungal infection involving chronically macerated / ulcerated tissue along underside of panus and b/l inguinal folds. Will treat with doxycycline BID x 14d with multiple scattered skin abscesses present today and fluconazole 200 mg QD x 7d for fungal associated rash. Keep clean with soap and water, pat dry and use absorbant pad to prevent skin on skin.

## 2019-12-26 NOTE — Patient Instructions (Addendum)
For your skin I am going to do an antibiotic twice a day called DOXYCYLINE - take this with food until they are gone.   I will also do an antifungal for you called FLUCONAZOLE - take one pill once a day.   Keep the lesions clean and dry. Clean with soap and water then pat dry. Use pads to protect the skin from rubbing together.   Please resume your BACTRIM one pill everyday   Please resume your BIKTARVY one pill every day   Please come back in 3-4 weeks so we can follow up on your skin lesions. We may need to send you to get one biopsied.   I would like to repeat your pap smear and pelvic exam also if you still have drainage at your next appointment.

## 2019-12-26 NOTE — Assessment & Plan Note (Signed)
Referred to [facility] today. Was previously working with [facility] [employer] but declined further partnership at this time.

## 2019-12-26 NOTE — Assessment & Plan Note (Signed)
She has return of several dark [place]/black nodules on the skin c/w Kaposi's Sarcoma. Will resume her ARVs now. Plan to treat acute skin fungal/bacterial issues then consider referral for biopsy.  Planning abdominal / pelvic CT scan to also assess possibility of visceral involvement.

## 2019-12-27 LAB — T-HELPER CELL (CD4) - (RCID CLINIC ONLY)
CD4 % Helper T Cell: 1 % — ABNORMAL LOW (ref 33–65)
CD4 T Cell Abs: <35 /uL — ABNORMAL LOW (ref 400–1790)

## 2019-12-28 ENCOUNTER — Other Ambulatory Visit: Payer: Self-pay

## 2019-12-28 ENCOUNTER — Ambulatory Visit: Payer: Medicaid Other

## 2020-01-04 LAB — COMPLETE METABOLIC PANEL WITH GFR
AG Ratio: 0.7 (calc) — ABNORMAL LOW (ref 1.0–2.5)
ALT: 22 U/L (ref 6–29)
AST: 42 U/L — ABNORMAL HIGH (ref 10–30)
Albumin: 3.5 g/dL — ABNORMAL LOW (ref 3.6–5.1)
Alkaline phosphatase (APISO): 107 U/L (ref 31–125)
BUN: 12 mg/dL (ref 7–25)
CO2: 26 mmol/L (ref 20–32)
Calcium: 8.6 mg/dL (ref 8.6–10.2)
Chloride: 106 mmol/L (ref 98–110)
Creat: 0.64 mg/dL (ref 0.50–1.10)
GFR, Est African American: 132 mL/min/{1.73_m2} (ref >=60)
GFR, Est Non African American: 114 mL/min/{1.73_m2} (ref >=60)
Globulin: 4.8 g/dL (calc) — ABNORMAL HIGH (ref 1.9–3.7)
Glucose, Bld: 88 mg/dL (ref 65–99)
Potassium: 3.7 mmol/L (ref 3.5–5.3)
Sodium: 139 mmol/L (ref 135–146)
Total Bilirubin: 0.9 mg/dL (ref 0.2–1.2)
Total Protein: 8.3 g/dL — ABNORMAL HIGH (ref 6.1–8.1)

## 2020-01-04 LAB — CBC WITH DIFFERENTIAL/PLATELET
Absolute Monocytes: 291 cells/uL (ref 200–950)
Basophils Absolute: 19 cells/uL (ref 0–200)
Basophils Relative: 0.6 %
Eosinophils Absolute: 282 cells/uL (ref 15–500)
Eosinophils Relative: 8.8 %
HCT: 32.6 % — ABNORMAL LOW (ref 35.0–45.0)
Hemoglobin: 10.7 g/dL — ABNORMAL LOW (ref 11.7–15.5)
Lymphs Abs: 502 cells/uL — ABNORMAL LOW (ref 850–3900)
MCH: 27.5 pg (ref 27.0–33.0)
MCHC: 32.8 g/dL (ref 32.0–36.0)
MCV: 83.8 fL (ref 80.0–100.0)
MPV: 11.2 fL (ref 7.5–12.5)
Monocytes Relative: 9.1 %
Neutro Abs: 2106 cells/uL (ref 1500–7800)
Neutrophils Relative %: 65.8 %
Platelets: 165 10*3/uL (ref 140–400)
RBC: 3.89 10*6/uL (ref 3.80–5.10)
RDW: 15 % (ref 11.0–15.0)
Total Lymphocyte: 15.7 %
WBC: 3.2 10*3/uL — ABNORMAL LOW (ref 3.8–10.8)

## 2020-01-04 LAB — HIV-1 INTEGRASE GENOTYPE

## 2020-01-04 LAB — RPR: RPR Ser Ql: NONREACTIVE

## 2020-01-04 LAB — HIV RNA, RTPCR W/R GT (RTI, PI,INT)
HIV 1 RNA Quant: 60200 copies/mL — ABNORMAL HIGH
HIV-1 RNA Quant, Log: 4.78 Log copies/mL — ABNORMAL HIGH

## 2020-01-04 LAB — HIV-1 GENOTYPE: HIV-1 Genotype: DETECTED — AB

## 2020-01-18 ENCOUNTER — Ambulatory Visit: Payer: Medicaid Other | Admitting: Infectious Diseases

## 2020-07-25 ENCOUNTER — Other Ambulatory Visit: Payer: Self-pay

## 2020-07-25 ENCOUNTER — Encounter (HOSPITAL_BASED_OUTPATIENT_CLINIC_OR_DEPARTMENT_OTHER): Payer: Self-pay | Admitting: *Deleted

## 2020-07-25 ENCOUNTER — Emergency Department (HOSPITAL_BASED_OUTPATIENT_CLINIC_OR_DEPARTMENT_OTHER)
Admission: EM | Admit: 2020-07-25 | Discharge: 2020-07-26 | Disposition: A | Discharge status: Home / Self Care | Payer: Medicaid Other | Attending: Emergency Medicine | Admitting: Emergency Medicine

## 2020-07-25 DIAGNOSIS — Z21 Asymptomatic human immunodeficiency virus [HIV] infection status: Secondary | ICD-10-CM | POA: Diagnosis not present

## 2020-07-25 DIAGNOSIS — Z20822 Contact with and (suspected) exposure to covid-19: Secondary | ICD-10-CM | POA: Insufficient documentation

## 2020-07-25 DIAGNOSIS — J45909 Unspecified asthma, uncomplicated: Secondary | ICD-10-CM | POA: Insufficient documentation

## 2020-07-25 DIAGNOSIS — R112 Nausea with vomiting, unspecified: Secondary | ICD-10-CM | POA: Insufficient documentation

## 2020-07-25 DIAGNOSIS — R197 Diarrhea, unspecified: Secondary | ICD-10-CM | POA: Insufficient documentation

## 2020-07-25 DIAGNOSIS — F17210 Nicotine dependence, cigarettes, uncomplicated: Secondary | ICD-10-CM | POA: Diagnosis not present

## 2020-07-25 DIAGNOSIS — R1084 Generalized abdominal pain: Secondary | ICD-10-CM | POA: Diagnosis not present

## 2020-07-25 LAB — URINALYSIS, MICROSCOPIC (REFLEX)

## 2020-07-25 LAB — URINALYSIS, ROUTINE W REFLEX MICROSCOPIC
Bilirubin Urine: NEGATIVE
Glucose, UA: NEGATIVE mg/dL
Ketones, ur: NEGATIVE mg/dL
Nitrite: NEGATIVE
Protein, ur: 30 mg/dL — AB
Specific Gravity, Urine: 1.025 (ref 1.005–1.030)
pH: 6 (ref 5.0–8.0)

## 2020-07-25 LAB — COMPREHENSIVE METABOLIC PANEL
ALT: 15 U/L (ref 0–44)
AST: 29 U/L (ref 15–41)
Albumin: 3.2 g/dL — ABNORMAL LOW (ref 3.5–5.0)
Alkaline Phosphatase: 94 U/L (ref 38–126)
Anion gap: 8 (ref 5–15)
BUN: 15 mg/dL (ref 6–20)
CO2: 22 mmol/L (ref 22–32)
Calcium: 7.9 mg/dL — ABNORMAL LOW (ref 8.9–10.3)
Chloride: 104 mmol/L (ref 98–111)
Creatinine, Ser: 0.63 mg/dL (ref 0.44–1.00)
GFR, Estimated: >60 mL/min (ref >60)
Glucose, Bld: 105 mg/dL — ABNORMAL HIGH (ref 70–99)
Potassium: 3.2 mmol/L — ABNORMAL LOW (ref 3.5–5.1)
Sodium: 134 mmol/L — ABNORMAL LOW (ref 135–145)
Total Bilirubin: 0.6 mg/dL (ref 0.3–1.2)
Total Protein: 8.7 g/dL — ABNORMAL HIGH (ref 6.5–8.1)

## 2020-07-25 LAB — CBC
HCT: 33.4 % — ABNORMAL LOW (ref 36.0–46.0)
Hemoglobin: 10.1 g/dL — ABNORMAL LOW (ref 12.0–15.0)
MCH: 25.6 pg — ABNORMAL LOW (ref 26.0–34.0)
MCHC: 30.2 g/dL (ref 30.0–36.0)
MCV: 84.6 fL (ref 80.0–100.0)
Platelets: 140 10*3/uL — ABNORMAL LOW (ref 150–400)
RBC: 3.95 MIL/uL (ref 3.87–5.11)
RDW: 16.8 % — ABNORMAL HIGH (ref 11.5–15.5)
WBC: 5.7 10*3/uL (ref 4.0–10.5)
nRBC: 0 % (ref 0.0–0.2)

## 2020-07-25 LAB — LIPASE, BLOOD: Lipase: 23 U/L (ref 11–51)

## 2020-07-25 LAB — PREGNANCY, URINE: Preg Test, Ur: NEGATIVE

## 2020-07-25 MED ORDER — ONDANSETRON 4 MG PO TBDP
8.0000 mg | ORAL_TABLET | Freq: Once | ORAL | Status: AC
  Administered 2020-07-26: 8 mg via ORAL
  Filled 2020-07-25: qty 2

## 2020-07-25 MED ORDER — DICYCLOMINE HCL 10 MG/ML IM SOLN
20.0000 mg | Freq: Once | INTRAMUSCULAR | Status: AC
  Administered 2020-07-26: 20 mg via INTRAMUSCULAR
  Filled 2020-07-25: qty 2

## 2020-07-25 NOTE — ED Triage Notes (Signed)
Abdominal pain this am. Cramping. Vomiting and diarrhea.

## 2020-07-26 ENCOUNTER — Encounter (HOSPITAL_BASED_OUTPATIENT_CLINIC_OR_DEPARTMENT_OTHER): Payer: Self-pay | Admitting: Emergency Medicine

## 2020-07-26 LAB — SARS CORONAVIRUS 2 (TAT 6-24 HRS): SARS Coronavirus 2: NEGATIVE

## 2020-07-26 MED ORDER — IBUPROFEN 800 MG PO TABS
800.0000 mg | ORAL_TABLET | Freq: Once | ORAL | Status: AC
  Administered 2020-07-26: 800 mg via ORAL
  Filled 2020-07-26: qty 1

## 2020-07-26 MED ORDER — ONDANSETRON 8 MG PO TBDP: ORAL_TABLET | ORAL | 0 refills | Status: DC

## 2020-07-26 MED ORDER — ACETAMINOPHEN 500 MG PO TABS
1000.0000 mg | ORAL_TABLET | Freq: Once | ORAL | Status: AC
  Administered 2020-07-26: 1000 mg via ORAL
  Filled 2020-07-26: qty 2

## 2020-07-26 NOTE — ED Notes (Signed)
Pt able to drink water without vomiting. Reports improvement in nausea.

## 2020-07-26 NOTE — ED Notes (Signed)
Patient awakened for assessment. Reports generalized body aches and pain along with abd pain.

## 2020-07-26 NOTE — Discharge Instructions (Signed)
Person Under Monitoring Name: Beverly [name]  Location: [place] [address]   Infection Prevention Recommendations for Individuals Confirmed to have, or Being Evaluated for, 2019 Novel Coronavirus (COVID-19) Infection Who Receive Care at Home  Individuals who are confirmed to have, or are being evaluated for, COVID-19 should follow the prevention steps below until a healthcare provider or local or state health department says they can return to normal activities.  Stay home except to get medical care You should restrict activities outside your home, except for getting medical care. Do not go to work, school, or public areas, and do not use public transportation or taxis.  Call ahead before visiting your doctor Before your medical appointment, call the healthcare provider and tell them that you have, or are being evaluated for, COVID-19 infection. This will help the healthcare providers office take steps to keep other people from getting infected. Ask your healthcare provider to call the local or state health department.  Monitor your symptoms Seek prompt medical attention if your illness is worsening (e.g., difficulty breathing). Before going to your medical appointment, call the healthcare provider and tell them that you have, or are being evaluated for, COVID-19 infection. Ask your healthcare provider to call the local or state health department.  Wear a facemask You should wear a facemask that covers your nose and mouth when you are in the same room with other people and when you visit a healthcare provider. People who live with or visit you should also wear a facemask while they are in the same room with you.  Separate yourself from other people in your home As much as possible, you should stay in a different room from other people in your home. Also, you should use a separate bathroom, if available.  Avoid sharing household items You should not  share dishes, drinking glasses, cups, eating utensils, towels, bedding, or other items with other people in your home. After using these items, you should wash them thoroughly with soap and water.  Cover your coughs and sneezes Cover your mouth and nose with a tissue when you cough or sneeze, or you can cough or sneeze into your sleeve. Throw used tissues in a lined trash can, and immediately wash your hands with soap and water for at least 20 seconds or use an alcohol-based hand rub.  Wash your [facility] your hands often and thoroughly with soap and water for at least 20 seconds. You can use an alcohol-based hand sanitizer if soap and water are not available and if your hands are not visibly dirty. Avoid touching your eyes, nose, and mouth with unwashed hands.   Prevention Steps for Caregivers and Household Members of Individuals Confirmed to have, or Being Evaluated for, COVID-19 Infection Being Cared for in the Home  If you live with, or provide care at home for, a person confirmed to have, or being evaluated for, COVID-19 infection please follow these guidelines to prevent infection:  Follow healthcare providers instructions Make sure that you understand and can help the patient follow any healthcare provider instructions for all care.  Provide for the patients basic needs You should help the patient with basic needs in the home and provide support for getting groceries, prescriptions, and other personal needs.  Monitor the patients symptoms If they are getting sicker, call his or her medical provider and tell them that the patient has, or is being evaluated for, COVID-19 infection. This will help the healthcare providers office  take steps to keep other people from getting infected. Ask the healthcare provider to call the local or state health department.  Limit the number of people who have contact with the patient If possible, have only one caregiver for the  patient. Other household members should stay in another home or place of residence. If this is not possible, they should stay in another room, or be separated from the patient as much as possible. Use a separate bathroom, if available. Restrict visitors who do not have an essential need to be in the home.  Keep older adults, very young children, and other sick people away from the patient Keep older adults, very young children, and those who have compromised immune systems or chronic health conditions away from the patient. This includes people with chronic heart, lung, or kidney conditions, diabetes, and cancer.  Ensure good ventilation Make sure that shared spaces in the home have good air flow, such as from an air conditioner or an opened window, weather permitting.  Wash your hands often Wash your hands often and thoroughly with soap and water for at least 20 seconds. You can use an alcohol based hand sanitizer if soap and water are not available and if your hands are not visibly dirty. Avoid touching your eyes, nose, and mouth with unwashed hands. Use disposable paper towels to dry your hands. If not available, use dedicated cloth towels and replace them when they become wet.  Wear a facemask and gloves Wear a disposable facemask at all times in the room and gloves when you touch or have contact with the patients blood, body fluids, and/or secretions or excretions, such as sweat, saliva, sputum, nasal mucus, vomit, urine, or feces.  Ensure the mask fits over your nose and mouth tightly, and do not touch it during use. Throw out disposable facemasks and gloves after using them. Do not reuse. Wash your hands immediately after removing your facemask and gloves. If your personal clothing becomes contaminated, carefully remove clothing and launder. Wash your hands after handling contaminated clothing. Place all used disposable facemasks, gloves, and other waste in a lined container before  disposing them with other household waste. Remove gloves and wash your hands immediately after handling these items.  Do not share dishes, glasses, or other household items with the patient Avoid sharing household items. You should not share dishes, drinking glasses, cups, eating utensils, towels, bedding, or other items with a patient who is confirmed to have, or being evaluated for, COVID-19 infection. After the person uses these items, you should wash them thoroughly with soap and water.  Wash laundry thoroughly Immediately remove and wash clothes or bedding that have blood, body fluids, and/or secretions or excretions, such as sweat, saliva, sputum, nasal mucus, vomit, urine, or feces, on them. Wear gloves when handling laundry from the patient. Read and follow directions on labels of laundry or clothing items and detergent. In general, wash and dry with the warmest temperatures recommended on the label.  Clean all areas the individual has used often Clean all touchable surfaces, such as counters, tabletops, doorknobs, bathroom fixtures, toilets, phones, keyboards, tablets, and bedside tables, every day. Also, clean any surfaces that may have blood, body fluids, and/or secretions or excretions on them. Wear gloves when cleaning surfaces the patient has come in contact with. Use a diluted bleach solution (e.g., dilute bleach with 1 part bleach and 10 parts water) or a household disinfectant with a label that says EPA-registered for coronaviruses. To make a bleach  solution at home, add 1 tablespoon of bleach to 1 quart (4 cups) of water. For a larger supply, add  cup of bleach to 1 gallon (16 cups) of water. Read labels of cleaning products and follow recommendations provided on product labels. Labels contain instructions for safe and effective use of the cleaning product including precautions you should take when applying the product, such as wearing gloves or eye protection and making sure you  have good ventilation during use of the product. Remove gloves and wash hands immediately after cleaning.  Monitor yourself for signs and symptoms of illness Caregivers and household members are considered close contacts, should monitor their health, and will be asked to limit movement outside of the home to the extent possible. Follow the monitoring steps for close contacts listed on the symptom monitoring form.   ? If you have additional questions, contact your local health department or call the epidemiologist on call at [phone] (available 24/7). ? This guidance is subject to change. For the most up-to-date guidance from [facility], please refer to their website: [email or web address]

## 2020-07-26 NOTE — ED Provider Notes (Signed)
[facility] Provider Note   CSN: [id number] Arrival date & time: 07/25/20  1933     History Chief Complaint  Patient presents with  . Abdominal Pain    Beverly [name] is a 40 y.o. female.  The history is provided by the patient.  Abdominal Pain Pain location:  Generalized Pain quality: cramping   Pain radiates to:  Does not radiate Pain severity:  Moderate Onset quality:  Gradual Duration:  1 day Timing:  Constant Progression:  Unchanged Chronicity:  New Context: retching   Context: not eating and not trauma   Relieved by:  Nothing Worsened by:  Nothing Ineffective treatments:  None tried Associated symptoms: diarrhea, nausea and vomiting   Associated symptoms: no anorexia, no dysuria, no fever and no shortness of breath   Associated symptoms comment:  Body aches works around [employer] is not covid vaccinated.  Risk factors: no alcohol abuse, no aspirin use and not elderly        Past Medical History:  Diagnosis Date  . Abscess   . AIDS ([place]) 03/19/2015  . Anxiety   . Asthma   . Blood transfusion without reported diagnosis   . Depression   . HIV infection ([place])   . Homeless 03/19/2015   states stays with family or friends. Does not rent or own a home, but does not live on streets.  . 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 Leg lesion 03/19/2015  . Major depression, recurrent ([place]) 03/19/2015  . 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 Skin ulcer ([place]) 05/06/2015    Patient Active Problem List   Diagnosis Date Noted  . Nausea & vomiting 05/25/2018  . Ulceration, vulva 03/10/2018  . Itching 03/10/2018  . 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 Pancytopenia ([place]) 01/09/2016  . Homeless 03/19/2015  . 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 Depression 10/17/2013  . Anemia 10/17/2013  . Thrombocytopenia ([place]) 10/17/2013  . Cigarette smoker 10/17/2013  . Asthma 10/17/2013  . Encounter for general  adult medical examination without abnormal findings 06/12/2011  . 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 Anxiety and depression 07/17/2008  . AIDS (acquired immune deficiency syndrome) ([place]) 06/28/2008    Past Surgical History:  Procedure Laterality Date  . CERVICAL CONIZATION W/BX N/A 04/14/2018   Procedure: COLD KNIFE CONIZATION CERVIX WITH BIOPSY;  Surgeon: Isabel Caprice, MD;  [facility];  Service: Gynecology;  Laterality: N/A;  . CESAREAN SECTION    . DILATION AND CURETTAGE OF UTERUS N/A 04/14/2018   Procedure: ENDOCERVICAL CURETTAGE;  Surgeon: Isabel Caprice, MD;  Location: [facility];  Service: Gynecology;  Laterality: N/A;  . INCISION AND DRAINAGE ABSCESS Right 01/10/2016   Procedure: INCISION AND DRAINAGE RIGHT BUTTOCK, LEFT LABIAL , EXCISION AND DRAINAGE OF  RIGHT AXILLARY ABSCESS;  Surgeon: Excell Seltzer, MD;  Location: [facility];  Service: General;  Laterality: Right;  . TUBAL LIGATION  2005  . VULVA Milag[place] Loll BIOPSY N/A 04/14/2018   Procedure: VULVAR BIOPSIES;  Surgeon: Isabel Caprice, MD;  [facility];  Service: Gynecology;  Laterality: N/A;     OB History    Gravida  1   Para      Term      Preterm      AB      Living        SAB      IAB  Ectopic      Multiple      Live Births              Family History  Problem Relation Age of Onset  . Throat cancer Mother        smoker  . Throat cancer Father        smoker  . Hypertension Father   . Cirrhosis Father   . Hypertension Other   . Cancer Other        smoker  . Diabetes Other   . Asthma Other     Social History   Tobacco Use  . Smoking status: Current Every Day Smoker    Packs/day: 0.10    Years: 3.00    Pack years: 0.30    Types: Cigarettes  . Smokeless tobacco: Never Used  . Tobacco comment: 1-2 a day  Vaping Use  . Vaping Use: Never used  Substance Use Topics  . Alcohol use: Yes     Alcohol/week: 0.0 standard drinks    Comment: Ocassionally  . Drug use: Yes    Frequency: 2.0 times per week    Types: Marijuana, MDMA (Ecstacy)    Home Medications Prior to Admission medications   Medication Sig Start Date End Date Taking? Authorizing Provider  azithromycin (ZITHROMAX) 600 MG tablet Take 2 tablets (1,200 mg total) by mouth once a week. 02/22/19   Thornton Callas, NP  bictegravir-emtricitabine-tenofovir AF (BIKTARVY) 50-200-25 MG TABS tablet Take 1 tablet by mouth daily. Try to take at the same time each day with or without food. 02/22/19   [place] Callas, NP  bictegravir-emtricitabine-tenofovir AF (BIKTARVY) 50-200-25 MG TABS tablet Take 1 tablet by mouth daily. 12/26/19   [place] Callas, NP  doxycycline (VIBRAMYCIN) 100 MG capsule Take 1 capsule (100 mg total) by mouth 2 (two) times daily. 04/05/19   Law, Bea Graff, PA-C  DULoxetine (CYMBALTA) 30 MG capsule Take 1 capsule (30 mg total) by mouth daily for 7 days, THEN 2 capsules (60 mg total) daily. Patient not taking: Reported on 01/14/2019 05/04/18 09/03/18  [place] Callas, NP  fluconazole (DIFLUCAN) 200 MG tablet Take 1 tablet (200 mg total) by mouth daily. 12/26/19   [place] Callas, NP  hydrOXYzine (ATARAX/VISTARIL) 25 MG tablet Take 1 tablet (25 mg total) by mouth 3 (three) times daily as needed. 03/10/18   [place] Callas, NP  ibuprofen (ADVIL) 200 MG tablet Take 200 mg by mouth every 6 (six) hours as needed for moderate pain.    [provider]  metroNIDAZOLE (FLAGYL) 500 MG tablet Take 1 tablet (500 mg total) by mouth 2 (two) times daily. 04/05/19   Law, Bea Graff, PA-C  mupirocin cream (BACTROBAN) 2 % Apply 1 application topically 2 (two) times daily. 12/07/19   Jenella Craigie, MD  naphazoline (NAPHCON) 0.1 % ophthalmic solution Place 1 drop into both eyes 4 (four) times daily as needed for eye irritation. 09/03/18   Robinson, [place] N, PA-C  ondansetron (ZOFRAN) 4 MG tablet Take 1 tablet (4 mg  total) by mouth every 6 (six) hours as needed for nausea. 05/26/18   Aline August, MD  sulfamethoxazole-trimethoprim (BACTRIM DS) 800-160 MG tablet Take 1 tablet by mouth daily. 02/22/19   [place] Callas, NP  sulfamethoxazole-trimethoprim (BACTRIM DS) 800-160 MG tablet Take 1 tablet by mouth daily. 12/26/19   [place] Callas, NP  traZODone (DESYREL) 50 MG tablet Take 1-2 tablets (50-100 mg total) by mouth at bedtime. 04/11/18   Dixon,  Melton Krebs, NP    Allergies    Bee venom and Morphine and related  Review of Systems   Review of Systems  Constitutional: Negative for fever.  HENT: Positive for congestion.   Eyes: Negative for visual disturbance.  Respiratory: Negative for shortness of breath.   Gastrointestinal: Positive for abdominal pain, diarrhea, nausea and vomiting. Negative for anorexia.  Genitourinary: Negative for dysuria.  Musculoskeletal: Positive for myalgias. Negative for arthralgias.  Skin: Negative for pallor.  Neurological: Negative for dizziness.  Psychiatric/Behavioral: Negative for agitation.  All other systems reviewed and are negative.   Physical Exam Updated Vital Signs BP (!) 125/92 (BP Location: Right Arm)   Pulse 90   Temp 99 F (37.2 C) (Oral)   Resp 18   Ht 5\' 1"  (1.549 m)   Wt 57.5 kg   LMP 07/11/2020   SpO2 98%   BMI 23.96 kg/m   Physical Exam Vitals and nursing note reviewed.  Constitutional:      General: She is not in acute distress.    Appearance: Normal appearance.  HENT:     Head: Normocephalic and atraumatic.     Nose: Nose normal.  Eyes:     Conjunctiva/sclera: Conjunctivae normal.     Pupils: Pupils are equal, round, and reactive to light.  Cardiovascular:     Rate and Rhythm: Normal rate and regular rhythm.     Pulses: Normal pulses.     Heart sounds: Normal heart sounds.  Pulmonary:     Effort: Pulmonary effort is normal.     Breath sounds: Normal breath sounds.  Abdominal:     General: Abdomen is flat. Bowel sounds  are normal.     Palpations: Abdomen is soft.     Tenderness: There is no abdominal tenderness. There is no guarding or rebound.  Musculoskeletal:        General: Normal range of motion.     Cervical back: Normal range of motion and neck supple.  Skin:    General: Skin is warm and dry.     Capillary Refill: Capillary refill takes less than 2 seconds.  Neurological:     General: No focal deficit present.     Mental Status: She is alert and oriented to person, place, and time.     Deep Tendon Reflexes: Reflexes normal.  Psychiatric:        Mood and Affect: Mood normal.        Behavior: Behavior normal.     ED Results / Procedures / Treatments   Labs (all labs ordered are listed, but only abnormal results are displayed) Results for orders placed or performed during the hospital encounter of 07/25/20  Lipase, blood  Result Value Ref Range   Lipase 23 11 - 51 U/L  Comprehensive metabolic panel  Result Value Ref Range   Sodium 134 (L) 135 - 145 mmol/L   Potassium 3.2 (L) 3.5 - 5.1 mmol/L   Chloride 104 98 - 111 mmol/L   CO2 22 22 - 32 mmol/L   Glucose, Bld 105 (H) 70 - 99 mg/dL   BUN 15 6 - 20 mg/dL   Creatinine, Ser 0.63 0.44 - 1.00 mg/dL   Calcium 7.9 (L) 8.9 - 10.3 mg/dL   Total Protein 8.7 (H) 6.5 - 8.1 g/dL   Albumin 3.2 (L) 3.5 - 5.0 g/dL   AST 29 15 - 41 U/L   ALT 15 0 - 44 U/L   Alkaline Phosphatase 94 38 - 126 U/L   Total  Bilirubin 0.6 0.3 - 1.2 mg/dL   GFR, Estimated >60 >60 mL/min   Anion gap 8 5 - 15  CBC  Result Value Ref Range   WBC 5.7 4.0 - 10.5 K/uL   RBC 3.95 3.87 - 5.11 MIL/uL   Hemoglobin 10.1 (L) 12.0 - 15.0 g/dL   HCT 33.4 (L) 36.0 - 46.0 %   MCV 84.6 80.0 - 100.0 fL   MCH 25.6 (L) 26.0 - 34.0 pg   MCHC 30.2 30.0 - 36.0 g/dL   RDW 16.8 (H) 11.5 - 15.5 %   Platelets 140 (L) 150 - 400 K/uL   nRBC 0.0 0.0 - 0.2 %  Urinalysis, Routine w reflex microscopic Urine, Clean Catch  Result Value Ref Range   Color, Urine YELLOW YELLOW   APPearance CLEAR  CLEAR   Specific Gravity, Urine 1.025 1.005 - 1.030   pH 6.0 5.0 - 8.0   Glucose, UA NEGATIVE NEGATIVE mg/dL   Hgb urine dipstick SMALL (A) NEGATIVE   Bilirubin Urine NEGATIVE NEGATIVE   Ketones, ur NEGATIVE NEGATIVE mg/dL   Protein, ur 30 (A) NEGATIVE mg/dL   Nitrite NEGATIVE NEGATIVE   Leukocytes,Ua TRACE (A) NEGATIVE  Pregnancy, urine  Result Value Ref Range   Preg Test, Ur NEGATIVE NEGATIVE  Urinalysis, Microscopic (reflex)  Result Value Ref Range   RBC / HPF 6-10 0 - 5 RBC/hpf   WBC, UA 11-20 0 - 5 WBC/hpf   Bacteria, UA RARE (A) NONE SEEN   Squamous Epithelial / LPF 6-10 0 - 5   Mucus PRESENT    No results found.  EKG None  Radiology No results found.  Procedures Procedures   Medications Ordered in ED Medications  ondansetron (ZOFRAN-ODT) disintegrating tablet 8 mg (8 mg Oral Given 07/26/20 0013)  dicyclomine (BENTYL) injection 20 mg (20 mg Intramuscular Given 07/26/20 0014)  acetaminophen (TYLENOL) tablet 1,000 mg (1,000 mg Oral Given 07/26/20 0046)  ibuprofen (ADVIL) tablet 800 mg (800 mg Oral Given 07/26/20 0046)    ED Course  I have reviewed the triage vital signs and the nursing notes.  Pertinent labs & imaging results that were available during my care of the patient were reviewed by me and considered in my medical decision making (see chart for details).    Symptoms concerning for covid.  Swab sent.  Placed in strict home quarantine.  Exam, vitals and labs are benign and reassuring.  I do not believe imaging is needed at this time as there are no signs of acute abdomen.  PO challenged successfully.  Work note given.    Beverly [name] was evaluated in Emergency Department on 07/26/2020 for the symptoms described in the history of present illness. She was evaluated in the context of the global COVID-19 pandemic, which necessitated consideration that the patient might be at risk for infection with the SARS-CoV-2 virus that causes COVID-19. Institutional protocols and  algorithms that pertain to the evaluation of patients at risk for COVID-19 are in a state of rapid change based on information released by regulatory bodies including the CDC and federal and state organizations. These policies and algorithms were followed during the patient's care in the ED.  Final Clinical Impression(s) / ED Diagnoses Return for intractable cough, coughing up blood, fevers >100.4 unrelieved by medication, shortness of breath, intractable vomiting, chest pain, shortness of breath, weakness, numbness, changes in speech, facial asymmetry, abdominal pain, passing out, Inability to tolerate liquids or food, cough, altered mental status or any concerns. No signs of systemic  illness or infection. The patient is nontoxic-appearing on exam and vital signs are within normal limits.  I have reviewed the triage vital signs and the nursing notes. Pertinent labs & imaging results that were available during my care of the patient were reviewed by me and considered in my medical decision making (see chart for details). After history, exam, and medical workup I feel the patient has been appropriately medically screened and is safe for discharge home. Pertinent diagnoses were discussed with the patient. Patient was given return precautions.    Garland Hincapie, MD 07/26/20 [phone]

## 2020-08-21 ENCOUNTER — Telehealth: Payer: Self-pay

## 2020-08-21 NOTE — Telephone Encounter (Signed)
Patient called to schedule appointment with Colletta Maryland. Reports that she's had fevers off and on for weeks, increasing leg weakness and generally feeling unwell. Reports that the ED suggested she see Colletta Maryland however due to [place] restrictions informed her that we wouldn't be able to do an in person visit. Patient is scheduled for appointment on 3/17. Instructed patient to go to urgent care for assessment (vs long wait time at the ED) and to follow up here at our office on the 17th. Informed her that if the UC doctor had any questions they could reach out to [place].   Braelyn Jenson Lorita Officer, RN

## 2020-08-21 NOTE — Telephone Encounter (Signed)
I am happy to talk with the ER or UC.  She has very advanced HIV/AIDS and lots of possible things going on. I need to see why they did not opt to admit her recently - fevers, advanced HIV/AIDS and now CNS problems makes me worry she needs more urgent work up for CNS infection.   Why can't we do an in person visit - I really need to examine her to be helpful.  Has she had COVID recently (I thought it was a long time ago...)

## 2020-08-21 NOTE — Telephone Encounter (Signed)
Patient scheduled for appointment with Colletta Maryland on 3/7. Instructed patient to go to [facility] or ED should she start to feel worse. Patient reports that she's been off of her meds for months.   Thomas Rhude Lorita Officer, RN

## 2020-08-26 ENCOUNTER — Other Ambulatory Visit (HOSPITAL_COMMUNITY): Payer: Medicaid Other

## 2020-08-26 ENCOUNTER — Other Ambulatory Visit: Payer: Self-pay

## 2020-08-26 ENCOUNTER — Ambulatory Visit (INDEPENDENT_AMBULATORY_CARE_PROVIDER_SITE_OTHER): Payer: Medicaid Other | Admitting: Infectious Diseases

## 2020-08-26 ENCOUNTER — Encounter: Payer: Self-pay | Admitting: Infectious Diseases

## 2020-08-26 ENCOUNTER — Emergency Department (HOSPITAL_COMMUNITY): Payer: Medicaid Other

## 2020-08-26 ENCOUNTER — Inpatient Hospital Stay (HOSPITAL_COMMUNITY)
Admission: EM | Admit: 2020-08-26 | Discharge: 2020-08-30 | DRG: 975 | Disposition: A | Discharge status: Home / Self Care | Payer: Medicaid Other | Source: Ambulatory Visit | Attending: Internal Medicine | Admitting: Internal Medicine

## 2020-08-26 VITALS — BP 100/71 | HR 98 | Temp 98.2°F | Resp 16 | Ht 61.0 in | Wt 118.0 lb

## 2020-08-26 DIAGNOSIS — R531 Weakness: Secondary | ICD-10-CM

## 2020-08-26 DIAGNOSIS — A6000 Herpesviral infection of urogenital system, unspecified: Secondary | ICD-10-CM

## 2020-08-26 DIAGNOSIS — E876 Hypokalemia: Secondary | ICD-10-CM | POA: Diagnosis present

## 2020-08-26 DIAGNOSIS — G63 Polyneuropathy in diseases classified elsewhere: Secondary | ICD-10-CM

## 2020-08-26 DIAGNOSIS — Z6821 Body mass index (BMI) 21.0-21.9, adult: Secondary | ICD-10-CM

## 2020-08-26 DIAGNOSIS — R21 Rash and other nonspecific skin eruption: Secondary | ICD-10-CM

## 2020-08-26 DIAGNOSIS — N390 Urinary tract infection, site not specified: Secondary | ICD-10-CM | POA: Diagnosis present

## 2020-08-26 DIAGNOSIS — D72819 Decreased white blood cell count, unspecified: Secondary | ICD-10-CM | POA: Diagnosis present

## 2020-08-26 DIAGNOSIS — B20 Human immunodeficiency virus [HIV] disease: Secondary | ICD-10-CM | POA: Diagnosis not present

## 2020-08-26 DIAGNOSIS — N200 Calculus of kidney: Secondary | ICD-10-CM | POA: Diagnosis present

## 2020-08-26 DIAGNOSIS — D069 Carcinoma in situ of cervix, unspecified: Secondary | ICD-10-CM | POA: Diagnosis not present

## 2020-08-26 DIAGNOSIS — D638 Anemia in other chronic diseases classified elsewhere: Secondary | ICD-10-CM | POA: Diagnosis present

## 2020-08-26 DIAGNOSIS — G629 Polyneuropathy, unspecified: Secondary | ICD-10-CM | POA: Diagnosis present

## 2020-08-26 DIAGNOSIS — N879 Dysplasia of cervix uteri, unspecified: Secondary | ICD-10-CM | POA: Diagnosis present

## 2020-08-26 DIAGNOSIS — G4700 Insomnia, unspecified: Secondary | ICD-10-CM | POA: Diagnosis present

## 2020-08-26 DIAGNOSIS — Z9119 Patient's noncompliance with other medical treatment and regimen: Secondary | ICD-10-CM

## 2020-08-26 DIAGNOSIS — Z885 Allergy status to narcotic agent status: Secondary | ICD-10-CM

## 2020-08-26 DIAGNOSIS — Z808 Family history of malignant neoplasm of other organs or systems: Secondary | ICD-10-CM

## 2020-08-26 DIAGNOSIS — Z79899 Other long term (current) drug therapy: Secondary | ICD-10-CM

## 2020-08-26 DIAGNOSIS — A312 Disseminated mycobacterium avium-intracellulare complex (DMAC): Secondary | ICD-10-CM | POA: Diagnosis present

## 2020-08-26 DIAGNOSIS — C539 Malignant neoplasm of cervix uteri, unspecified: Secondary | ICD-10-CM | POA: Diagnosis present

## 2020-08-26 DIAGNOSIS — L989 Disorder of the skin and subcutaneous tissue, unspecified: Secondary | ICD-10-CM | POA: Diagnosis not present

## 2020-08-26 DIAGNOSIS — Z5900 Homelessness unspecified: Secondary | ICD-10-CM

## 2020-08-26 DIAGNOSIS — F411 Generalized anxiety disorder: Secondary | ICD-10-CM | POA: Diagnosis present

## 2020-08-26 DIAGNOSIS — R319 Hematuria, unspecified: Secondary | ICD-10-CM | POA: Diagnosis not present

## 2020-08-26 DIAGNOSIS — R296 Repeated falls: Secondary | ICD-10-CM | POA: Diagnosis present

## 2020-08-26 DIAGNOSIS — R634 Abnormal weight loss: Secondary | ICD-10-CM | POA: Diagnosis not present

## 2020-08-26 DIAGNOSIS — E440 Moderate protein-calorie malnutrition: Secondary | ICD-10-CM | POA: Diagnosis present

## 2020-08-26 DIAGNOSIS — R238 Other skin changes: Secondary | ICD-10-CM | POA: Diagnosis present

## 2020-08-26 DIAGNOSIS — R112 Nausea with vomiting, unspecified: Secondary | ICD-10-CM

## 2020-08-26 DIAGNOSIS — Z825 Family history of asthma and other chronic lower respiratory diseases: Secondary | ICD-10-CM

## 2020-08-26 DIAGNOSIS — F32A Depression, unspecified: Secondary | ICD-10-CM | POA: Diagnosis present

## 2020-08-26 DIAGNOSIS — E860 Dehydration: Secondary | ICD-10-CM | POA: Diagnosis present

## 2020-08-26 DIAGNOSIS — Z609 Problem related to social environment, unspecified: Secondary | ICD-10-CM | POA: Diagnosis present

## 2020-08-26 DIAGNOSIS — L03311 Cellulitis of abdominal wall: Secondary | ICD-10-CM | POA: Diagnosis present

## 2020-08-26 DIAGNOSIS — R627 Adult failure to thrive: Secondary | ICD-10-CM | POA: Diagnosis present

## 2020-08-26 DIAGNOSIS — R519 Headache, unspecified: Secondary | ICD-10-CM

## 2020-08-26 DIAGNOSIS — R64 Cachexia: Secondary | ICD-10-CM | POA: Diagnosis present

## 2020-08-26 DIAGNOSIS — F17210 Nicotine dependence, cigarettes, uncomplicated: Secondary | ICD-10-CM | POA: Diagnosis present

## 2020-08-26 DIAGNOSIS — Z833 Family history of diabetes mellitus: Secondary | ICD-10-CM

## 2020-08-26 DIAGNOSIS — Z8249 Family history of ischemic heart disease and other diseases of the circulatory system: Secondary | ICD-10-CM

## 2020-08-26 DIAGNOSIS — L98499 Non-pressure chronic ulcer of skin of other sites with unspecified severity: Secondary | ICD-10-CM

## 2020-08-26 DIAGNOSIS — R591 Generalized enlarged lymph nodes: Secondary | ICD-10-CM

## 2020-08-26 DIAGNOSIS — Z91030 Bee allergy status: Secondary | ICD-10-CM

## 2020-08-26 DIAGNOSIS — N766 Ulceration of vulva: Secondary | ICD-10-CM

## 2020-08-26 LAB — CBC
HCT: 31.6 % — ABNORMAL LOW (ref 36.0–46.0)
Hemoglobin: 9.7 g/dL — ABNORMAL LOW (ref 12.0–15.0)
MCH: 25.9 pg — ABNORMAL LOW (ref 26.0–34.0)
MCHC: 30.7 g/dL (ref 30.0–36.0)
MCV: 84.3 fL (ref 80.0–100.0)
Platelets: 91 10*3/uL — ABNORMAL LOW (ref 150–400)
RBC: 3.75 MIL/uL — ABNORMAL LOW (ref 3.87–5.11)
RDW: 17.2 % — ABNORMAL HIGH (ref 11.5–15.5)
WBC: 3.1 10*3/uL — ABNORMAL LOW (ref 4.0–10.5)
nRBC: 0 % (ref 0.0–0.2)

## 2020-08-26 LAB — COMPREHENSIVE METABOLIC PANEL
ALT: 15 U/L (ref 0–44)
AST: 43 U/L — ABNORMAL HIGH (ref 15–41)
Albumin: 2.7 g/dL — ABNORMAL LOW (ref 3.5–5.0)
Alkaline Phosphatase: 67 U/L (ref 38–126)
Anion gap: 9 (ref 5–15)
BUN: 8 mg/dL (ref 6–20)
CO2: 22 mmol/L (ref 22–32)
Calcium: 8 mg/dL — ABNORMAL LOW (ref 8.9–10.3)
Chloride: 105 mmol/L (ref 98–111)
Creatinine, Ser: 0.67 mg/dL (ref 0.44–1.00)
GFR, Estimated: >60 mL/min (ref >60)
Glucose, Bld: 89 mg/dL (ref 70–99)
Potassium: 3.3 mmol/L — ABNORMAL LOW (ref 3.5–5.1)
Sodium: 136 mmol/L (ref 135–145)
Total Bilirubin: 0.9 mg/dL (ref 0.3–1.2)
Total Protein: 8.4 g/dL — ABNORMAL HIGH (ref 6.5–8.1)

## 2020-08-26 LAB — URINALYSIS, ROUTINE W REFLEX MICROSCOPIC
Bilirubin Urine: NEGATIVE
Glucose, UA: NEGATIVE mg/dL
Ketones, ur: NEGATIVE mg/dL
Nitrite: NEGATIVE
Protein, ur: 30 mg/dL — AB
RBC / HPF: >50 RBC/hpf — ABNORMAL HIGH (ref 0–5)
Specific Gravity, Urine: 1.023 (ref 1.005–1.030)
WBC, UA: >50 WBC/hpf — ABNORMAL HIGH (ref 0–5)
pH: 6 (ref 5.0–8.0)

## 2020-08-26 LAB — LIPASE, BLOOD: Lipase: 22 U/L (ref 11–51)

## 2020-08-26 LAB — I-STAT BETA HCG BLOOD, ED (MC, WL, AP ONLY): I-stat hCG, quantitative: <5 m[IU]/mL (ref <5)

## 2020-08-26 MED ORDER — ONDANSETRON HCL 4 MG PO TABS: 4.0000 mg | ORAL_TABLET | Freq: Four times a day (QID) | ORAL | Status: DC | PRN

## 2020-08-26 MED ORDER — SULFAMETHOXAZOLE-TRIMETHOPRIM 800-160 MG PO TABS
1.0000 | ORAL_TABLET | Freq: Every day | ORAL | Status: DC
  Administered 2020-08-26 – 2020-08-27 (×2): 1 via ORAL
  Filled 2020-08-26 (×2): qty 1

## 2020-08-26 MED ORDER — ACETAMINOPHEN 325 MG PO TABS: 650.0000 mg | ORAL_TABLET | Freq: Four times a day (QID) | ORAL | Status: DC | PRN

## 2020-08-26 MED ORDER — IBUPROFEN 200 MG PO TABS: 200.0000 mg | ORAL_TABLET | Freq: Four times a day (QID) | ORAL | Status: DC | PRN

## 2020-08-26 MED ORDER — ENOXAPARIN SODIUM 40 MG/0.4ML ~~LOC~~ SOLN: 40.0000 mg | SUBCUTANEOUS | Status: DC

## 2020-08-26 MED ORDER — POTASSIUM CHLORIDE CRYS ER 20 MEQ PO TBCR
40.0000 meq | EXTENDED_RELEASE_TABLET | Freq: Once | ORAL | Status: AC
  Administered 2020-08-26: 40 meq via ORAL
  Filled 2020-08-26: qty 2

## 2020-08-26 MED ORDER — THIAMINE HCL 100 MG/ML IJ SOLN
INTRAVENOUS | Status: DC
  Filled 2020-08-26 (×4): qty 1000

## 2020-08-26 MED ORDER — SODIUM CHLORIDE 0.9 % IV SOLN
1.0000 g | Freq: Once | INTRAVENOUS | Status: AC
  Administered 2020-08-26: 1 g via INTRAVENOUS
  Filled 2020-08-26: qty 10

## 2020-08-26 MED ORDER — ENSURE ENLIVE PO LIQD
237.0000 mL | Freq: Two times a day (BID) | ORAL | Status: DC
  Filled 2020-08-26: qty 237

## 2020-08-26 MED ORDER — IOHEXOL 300 MG/ML  SOLN
100.0000 mL | Freq: Once | INTRAMUSCULAR | Status: AC | PRN
  Administered 2020-08-26: 100 mL via INTRAVENOUS

## 2020-08-26 MED ORDER — ACETAMINOPHEN 650 MG RE SUPP: 650.0000 mg | Freq: Four times a day (QID) | RECTAL | Status: DC | PRN

## 2020-08-26 MED ORDER — TRIAMCINOLONE ACETONIDE 0.1 % EX OINT
TOPICAL_OINTMENT | Freq: Two times a day (BID) | CUTANEOUS | Status: DC | PRN
  Filled 2020-08-26 (×2): qty 15

## 2020-08-26 NOTE — Assessment & Plan Note (Signed)
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 None look acutely infected but very macerated. I dressed with non-adherent gauze.  May benefit from [facility] consultation to help with best dressing recommendations.

## 2020-08-26 NOTE — Assessment & Plan Note (Addendum)
Jaidon is here with multiple concerns for opportunistic process given her severely low immune system. She has fevers, weight loss, nausea, abdominal pain and diarrhea with poor appetite. 20# weight loss over the last month. On exam she has tender abdomen (mostly due to non-healing ulcerated skin lesions along stomach / groin), lymphadenopathy detected in head/neck, alopecia and multiple non-healing wounds from previous bed bug exposure 43m ago.  Will D/W admitting team to see if we can directly admit her to the hospital to expedite process. 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   Needs CT scan w/ and w/o contrast abdomen / pelvis to help with work up. Toxo IgG and  AFB blood cultures today in clinic along with CD4 (which will be < 35) and viral load with genotype. Will leave other stat labs to be drawn in ER at their assessment. May need lymph node biopsy at some point if this is visualized.   Headaches - these are new but controlled with OTC medication. However paired with the weakness in the legs and falls she will need MRI of the brain +/- LP (no signs of meningismus today so maybe just MRI sufficient with LP follow up if needed) to evaluate potential for CNS infection / process.  Continue Bactrim 1 DS daily for PJP prophylaxis. Hold ART until head imaging to ensure OK to resume. She was previously on Biktarvy once daily - has been out for months.   Will write her out of work for 1 week and then return TBD the above.

## 2020-08-26 NOTE — H&P (Signed)
History and Physical    Beverly [name]:[id number] DOB: 1980/11/28 DOA: 08/26/2020  PCP: Biscoe Callas, NP (Confirm with patient/family/NH records and if not entered, this has to be entered at [facility] point of entry) Patient coming from: Home  I have personally briefly reviewed patient's old medical records in [place]  Chief Complaint: Feeling weak, frequent diarrhea  HPI: Beverly [name] is a 40 y.o. female with medical history significant of AIDS noncompliant with HAART, presented with multiple complaints.  Patient has not been taking her HAART medication for last 3 to 4 months including Biktarvy and Bactrim DS. "I ran out all my meds, but could not refill them". Gradually, she developed multiple symptoms, on and off diarrhea, last episode of which was last week, usually loose-watery, no abdominal pain. She has been running low-grade fever, however she had a fever of T-max 102 last week. Second complaint is she has been losing weight of about 20+ pounds for last 1 month. She attributed much to the problem of loss of appetite. Third problem is that she developed skin bumps and ulcers on her legs and abdominal walls, and alternated of feeling itchiness and painful. She denied any urinary symptoms, no cough. 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 Infection disease send patient to hospital for patient's complicated medical problems  Due to generalized weakness, she has been having multiple episodes of falls last 2 weeks, including 2 times at her work last week. She described "like walking on clouds" and decreased sensation on her feet. Denies any back pain, urinary or bowel movement problems. ED Course: UA showed UTI. CT abd showed cellulitis-like contents on lower abdominal wall, lymphadenopathy which improved compared to 2020.  Review of  Systems: As per HPI otherwise 14 point review of systems negative.    Past Medical History:  Diagnosis Date  . Abscess   . AIDS ([place]) 03/19/2015  . Anxiety   . Asthma   . Blood transfusion without reported diagnosis   . Depression   . HIV infection ([place])   . Homeless 03/19/2015   states stays with family or friends. Does not rent or own a home, but does not live on streets.  . 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 Leg lesion 03/19/2015  . Major depression, recurrent ([place]) 03/19/2015  . 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 Skin ulcer ([place]) 05/06/2015    Past Surgical History:  Procedure Laterality Date  . CERVICAL CONIZATION W/BX N/A 04/14/2018   Procedure: COLD KNIFE CONIZATION CERVIX WITH BIOPSY;  Surgeon: Isabel Caprice, MD;  Location: [facility];  Service: Gynecology;  Laterality: N/A;  . CESAREAN SECTION    . DILATION AND CURETTAGE OF UTERUS N/A 04/14/2018   Procedure: ENDOCERVICAL CURETTAGE;  Surgeon: Isabel Caprice, MD;  Location: [facility];  Service: Gynecology;  Laterality: N/A;  . INCISION AND DRAINAGE ABSCESS Right 01/10/2016   Procedure: INCISION AND DRAINAGE RIGHT BUTTOCK, LEFT LABIAL , EXCISION AND DRAINAGE OF  RIGHT AXILLARY ABSCESS;  Surgeon: Excell Seltzer, MD;  Location: [facility];  Service: General;  Laterality: Right;  . TUBAL LIGATION  2005  . VULVA Milagros Loll BIOPSY N/A 04/14/2018   Procedure: VULVAR BIOPSIES;  Surgeon: Isabel Caprice, MD;  Location: [facility];  Service: Gynecology;  Laterality: N/A;  reports that she has been smoking cigarettes. She has a 0.30 pack-year smoking history. She has never used smokeless tobacco. She reports current alcohol use. She reports current drug use. Frequency: 2.00 times per week. Drugs: Marijuana and MDMA (Ecstacy).  Allergies  Allergen Reactions  . Bee Venom Anaphylaxis, Shortness Of Breath and Swelling  . Morphine And Related Hives and Itching     Just can't take "morphine", vicodin is OK    Family History  Problem Relation Age of Onset  . Throat cancer Mother        smoker  . Throat cancer Father        smoker  . Hypertension Father   . Cirrhosis Father   . Hypertension Other   . Cancer Other        smoker  . Diabetes Other   . Asthma Other      Prior to Admission medications   Medication Sig Start Date End Date Taking? Authorizing Oney Folz  azithromycin (ZITHROMAX) 600 MG tablet Take 2 tablets (1,200 mg total) by mouth once a week. Patient not taking: Reported on 08/26/2020 02/22/19   [place] Callas, NP  bictegravir-emtricitabine-tenofovir AF (BIKTARVY) 50-200-25 MG TABS tablet Take 1 tablet by mouth daily. Try to take at the same time each day with or without food. Patient not taking: Reported on 08/26/2020 02/22/19   [place] Callas, NP  doxycycline (VIBRAMYCIN) 100 MG capsule Take 1 capsule (100 mg total) by mouth 2 (two) times daily. Patient not taking: Reported on 08/26/2020 04/05/19   Frederica Kuster, PA-C  DULoxetine (CYMBALTA) 30 MG capsule Take 1 capsule (30 mg total) by mouth daily for 7 days, THEN 2 capsules (60 mg total) daily. Patient not taking: Reported on 01/14/2019 05/04/18 09/03/18  [place] Callas, NP  fluconazole (DIFLUCAN) 200 MG tablet Take 1 tablet (200 mg total) by mouth daily. Patient not taking: Reported on 08/26/2020 12/26/19   [place] Callas, NP  hydrOXYzine (ATARAX/VISTARIL) 25 MG tablet Take 1 tablet (25 mg total) by mouth 3 (three) times daily as needed. Patient not taking: Reported on 08/26/2020 03/10/18   [place] Callas, NP  ibuprofen (ADVIL) 200 MG tablet Take 200 mg by mouth every 6 (six) hours as needed for moderate pain. Patient not taking: Reported on 08/26/2020    Jeree Delcid, Historical, MD  metroNIDAZOLE (FLAGYL) 500 MG tablet Take 1 tablet (500 mg total) by mouth 2 (two) times daily. Patient not taking: Reported on 08/26/2020 04/05/19   Frederica Kuster, PA-C  mupirocin cream  (BACTROBAN) 2 % Apply 1 application topically 2 (two) times daily. Patient not taking: Reported on 08/26/2020 12/07/19   Palumbo, April, MD  naphazoline [facility]) 0.1 % ophthalmic solution Place 1 drop into both eyes 4 (four) times daily as needed for eye irritation. Patient not taking: Reported on 08/26/2020 09/03/18   Robinson, [place] N, PA-C  ondansetron (ZOFRAN ODT) 8 MG disintegrating tablet 36m ODT q8 hours prn nausea Patient not taking: Reported on 08/26/2020 07/26/20   Palumbo, April, MD  ondansetron (ZOFRAN) 4 MG tablet Take 1 tablet (4 mg total) by mouth every 6 (six) hours as needed for nausea. Patient not taking: Reported on 08/26/2020 05/26/18   AAline August MD  sulfamethoxazole-trimethoprim (BACTRIM DS) 800-160 MG tablet Take 1 tablet by mouth daily. Patient not taking: Reported on 08/26/2020 02/22/19   DRaleigh Callas NP  traZODone (DESYREL) 50 MG tablet Take 1-2 tablets (50-100 mg total) by mouth at bedtime. Patient not taking: Reported on 08/26/2020  04/11/18    Callas, NP    Physical Exam: Vitals:   08/26/20 1152 08/26/20 1513 08/26/20 1815  BP: 115/88 125/83 97/72  Pulse: 96 81 91  Resp: 17 20 (!) 26  Temp: 98.1 F (36.7 C)    SpO2: 100% 100% 100%    Constitutional: NAD, calm, comfortable Vitals:   08/26/20 1152 08/26/20 1513 08/26/20 1815  BP: 115/88 125/83 97/72  Pulse: 96 81 91  Resp: 17 20 (!) 26  Temp: 98.1 F (36.7 C)    SpO2: 100% 100% 100%   Eyes: PERRL, lids and conjunctivae normal ENMT: Mucous membranes are dry. Posterior pharynx clear of any exudate or lesions.Normal dentition.  Neck: normal, supple, no masses, no thyromegaly Respiratory: clear to auscultation bilaterally, no wheezing, no crackles. Normal respiratory effort. No accessory muscle use.  Cardiovascular: Regular rate and rhythm, no murmurs / rubs / gallops. No extremity edema. 2+ pedal pulses. No carotid bruits.  Abdomen: Multiple cellulitis like changes on lower abdomen groin area with  shallow ulcers Musculoskeletal: no clubbing / cyanosis. No joint deformity upper and lower extremities. Good ROM, no contractures. Normal muscle tone.  Skin: no rashes, lesions, ulcers. No induration Neurologic: CN 2-12 grossly intact. Sensation intact, DTR normal. Strength 5/5 in all 4.  Psychiatric: Normal judgment and insight. Alert and oriented x 3. Normal mood.        Labs on Admission: I have personally reviewed following labs and imaging studies  CBC: Recent Labs  Lab 08/26/20 1155  WBC 3.1*  HGB 9.7*  HCT 31.6*  MCV 84.3  PLT 91*   Basic Metabolic Panel: Recent Labs  Lab 08/26/20 1155  NA 136  K 3.3*  CL 105  CO2 22  GLUCOSE 89  BUN 8  CREATININE 0.67  CALCIUM 8.0*   GFR: Estimated Creatinine Clearance: 71.9 mL/min (by C-G formula based on SCr of 0.67 mg/dL). Liver Function Tests: Recent Labs  Lab 08/26/20 1155  AST 43*  ALT 15  ALKPHOS 67  BILITOT 0.9  PROT 8.4*  ALBUMIN 2.7*   Recent Labs  Lab 08/26/20 1155  LIPASE 22   No results for input(s): AMMONIA in the last 168 hours. Coagulation Profile: No results for input(s): INR, PROTIME in the last 168 hours. Cardiac Enzymes: No results for input(s): CKTOTAL, CKMB, CKMBINDEX, TROPONINI in the last 168 hours. BNP (last 3 results) No results for input(s): PROBNP in the last 8760 hours. HbA1C: No results for input(s): HGBA1C in the last 72 hours. CBG: No results for input(s): GLUCAP in the last 168 hours. Lipid Profile: No results for input(s): CHOL, HDL, LDLCALC, TRIG, CHOLHDL, LDLDIRECT in the last 72 hours. Thyroid Function Tests: No results for input(s): TSH, T4TOTAL, FREET4, T3FREE, THYROIDAB in the last 72 hours. Anemia Panel: No results for input(s): VITAMINB12, FOLATE, FERRITIN, TIBC, IRON, RETICCTPCT in the last 72 hours. Urine analysis:    Component Value Date/Time   COLORURINE AMBER (A) 08/26/2020 1252   APPEARANCEUR HAZY (A) 08/26/2020 1252   LABSPEC 1.023 08/26/2020 1252    PHURINE 6.0 08/26/2020 1252   GLUCOSEU NEGATIVE 08/26/2020 1252   GLUCOSEU NEG mg/dL 06/28/2008 2142   HGBUR MODERATE (A) 08/26/2020 1252   BILIRUBINUR NEGATIVE 08/26/2020 1252   KETONESUR NEGATIVE 08/26/2020 1252   PROTEINUR 30 (A) 08/26/2020 1252   UROBILINOGEN 1.0 08/12/2014 2350   NITRITE NEGATIVE 08/26/2020 1252   LEUKOCYTESUR LARGE (A) 08/26/2020 1252    Radiological Exams on Admission: CT Abdomen Pelvis W Contrast  Result Date: 08/26/2020 CLINICAL  DATA:  Acute nonlocalized abdominal pain. Lower abdominal pain. EXAM: CT ABDOMEN AND PELVIS WITH CONTRAST TECHNIQUE: Multidetector CT imaging of the abdomen and pelvis was performed using the standard protocol following bolus administration of intravenous contrast. CONTRAST:  150m OMNIPAQUE IOHEXOL 300 MG/ML  SOLN COMPARISON:  CT 04/05/2019 FINDINGS: Lower chest: The lung bases are clear. Hepatobiliary: Focal fatty infiltration adjacent to the falciform ligament. No suspicious hepatic lesion. Gallbladder physiologically distended, no calcified stone. No biliary dilatation. Pancreas: Unremarkable. No pancreatic ductal dilatation or surrounding inflammatory changes. Spleen: Normal in size without focal abnormality. Adrenals/Urinary Tract: Normal adrenal glands. Small bilateral nonobstructing renal calculi. No hydronephrosis or perinephric edema. Small low-density lesion in the medial right kidney is too small to characterize but likely small cyst. Urinary bladder is unremarkable. Stomach/Bowel: Bowel evaluation is limited in the absence of enteric contrast. Decompressed stomach. No small bowel obstruction or inflammation. The appendix is normal, decreased appendiceal caliber from prior exam. Majority of the colon is decompressed which limits assessment. There is no obvious colonic wall thickening or pericolonic edema. Sigmoid colon mildly tortuous. Vascular/Lymphatic: Prominent borderline and mildly enlarged lymph nodes in the external iliac and  inguinal stations, more so on the right. Largest node in the right external iliac station measures 12 mm short axis. There are multiple small retroperitoneal nodes, all subcentimeter. Lymph node size has improved from 2020 exam. Abdominal aorta is normal in caliber. Patent portal vein. Reproductive: Unremarkable uterus. Physiologic follicular cyst in the right ovary less than 3 cm. There is no suspicious adnexal mass. Other: Minimal free fluid in the pelvis. No upper abdominal ascites. No free air. There is skin thickening with mild subcutaneous edema involving the lower anterior abdominal wall, for example series 3, image 60. No soft tissue air. Musculoskeletal: There are no acute or suspicious osseous abnormalities. IMPRESSION: 1. Skin thickening with mild subcutaneous edema involving the lower anterior abdominal wall, suspicious for cellulitis. Recommend correlation with physical exam. 2. Prominent borderline and mildly enlarged lymph nodes in the external iliac and inguinal stations, more so on the right, likely reactive or related to HIV. Lymph node size has decreased from 2020 CT. 3. Bilateral nonobstructing renal calculi. Electronically Signed   By: MKeith RakeM.D.   On: 08/26/2020 17:20    EKG: None  Assessment/Plan Active Problems:   Weakness  (please populate well all problems here in Problem List. (For example, if patient is on BP meds at home and you resume or decide to hold them, it is a problem that needs to be her. Same for CAD, COPD, HLD and so on)  AIDS -Secondary to noncompliant with HAART -2 infection disease recommendation, will not restart HAART today given on viral status as well as resistance/genetic profile. -Restart PCP prophylaxis of Bactrim DS -Diarrhea has been intermittent, depending on CD4 finding, probably will put patient on MAC prophylaxis.  Severe dehydration/Generalized weakness and frequent falls -Clinically appears to be dehydrated/hypovolemia -Given the  poor oral intake for prolonged time, choose banana bag for hydration.  Abd wall cellulitis -PO Bactrim  Asymptomatic UTI -Bactrim will cover UTI as well.  Frequent falls and generalized weakness -Appears to have some level of peripheral neuropathy. Not sure how it related to her HIV status.  Unintentional weight loss -Secondary to HIV infection, with frequent diarrhea, raised concern about opportunistic infections such as MAC and/or CMV. -Check TSH   Reverse A/G ratio -probably 2nd to HIV infection, will not pursue mono-clonal gamopahies at this time.  Macular papular skin lesions given  the new onset of leg ulcers, ID specifically concerns about differentials such as Kaposi's sarcoma. -Topical steroid  Anemia -Check Iron study.  Moderate protein calorie malnutrition - Ensure BID  Thrombocytopenia -Probably related to AIDS -No chemical DVT phrophylaxis  Hx of polysubstance abuse -UDS pending  DVT prophylaxis: Early ambulation  code Status: Full Code Family Communication: None at bedside Disposition Plan: Expect less than 2 midnight hospital stay Consults called: ID Admission status: Medsurg obs  Lequita Halt MD [facility] Pager [phone]  08/26/2020, 6:33 PM

## 2020-08-26 NOTE — Progress Notes (Addendum)
Name: Beverly [name]  DOB: March 31, 1981 MRN: [id number] PCP: Kidron Callas, NP     Brief Narrative:  Beverly [name] is a 40 y.o. female with HIV disease, (+) AIDS. Dx ~2005.  CD4 nadir < 35 HIV Risk: heterosexual  History of OIs:  Intake Labs : Hep B sAg (- 2010), sAb (- 2010), cAb (- 2010); Hep A (), Hep C (- 2010) Quantiferon () HLA B*[id number] (-) G6PD: ()   Previous Regimens: . Atripla . Kaletra + Combivir  . Darunavir + Ritonavir + Truvada  . Tivicay + Descovy [id number] . Biktarvy    Genotypes: . Previously wildtype  . 12-2019: pending  Subjective:  CC: Multiple concerns today -  Draining skin lesions, black skin bumps, depression / anxiety, homeless, off medications, fevers, diarrhea, abdominal pain.     HPI: Has had a hard time since the last time we met in July 2021. Moved back to [place], her family took her medication. Her depression has been very severe. Got bit by bad bed bugs about a month ago - these spots are all over her legs, thighs, buttocks. Non-healing wounds now and history of them draining now.   She feels like she had severe vomiting/abdominal pain/diarrhea. This illness has continued same severity, but acutely worsening the last week or so. Lost weight - has not eaten in 2 days. Took some phenergan recently but it has not been able to help her keep food down.   Wt Readings from Last 3 Encounters:  08/26/20 118 lb (53.5 kg)  07/25/20 126 lb 12.8 oz (57.5 kg)  12/26/19 133 lb (60.3 kg)   She has a very bad trouble sleeping at night due to being extremely uncomfortable with body pain. Has also been having a lot of headaches. This is new for her. No changes to her vision. Noticed loss of all her hair, eyebrows and eyelashes.    Depression screen PHQ 2/9 08/26/2020  Decreased Interest 3  Down, Depressed, Hopeless 3  PHQ - 2 Score 6  Altered sleeping 3  Tired, decreased energy 3  Change in appetite 3  Feeling bad or failure about yourself  3   Trouble concentrating 3  Moving slowly or fidgety/restless 3  Suicidal thoughts 0  PHQ-9 Score 24  Difficult doing work/chores -  Some recent data might be hidden    Review of Systems  Constitutional: Positive for chills, fever and weight loss. Negative for malaise/fatigue.  Respiratory: Negative for cough, sputum production and shortness of breath.   Cardiovascular: Negative for chest pain and leg swelling.  Gastrointestinal: Negative for abdominal pain, blood in stool, diarrhea, nausea and vomiting.       Anorexia   Genitourinary: Negative for dysuria and frequency.  Musculoskeletal: Negative for back pain, joint pain, myalgias and neck pain.  Skin: Positive for itching and rash.  Neurological: Negative for headaches.  Psychiatric/Behavioral: Positive for depression. Negative for substance abuse. The patient is nervous/anxious and has insomnia.     Past Medical History:  Diagnosis Date  . Abscess   . AIDS ([place]) 03/19/2015  . Anxiety   . Asthma   . Blood transfusion without reported diagnosis   . Depression   . HIV infection ([place])   . Homeless 03/19/2015   states stays with family or friends. Does not rent or own a home, but does not live on streets.  . 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 Leg lesion 03/19/2015  . Major depression, recurrent ([place]) 03/19/2015  . 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 Skin ulcer ([place]) 05/06/2015    Outpatient Medications Prior to Visit  Medication Sig Dispense Refill  . azithromycin (ZITHROMAX) 600 MG tablet Take 2 tablets (1,200 mg total) by mouth once a week. (Patient not taking: Reported on 08/26/2020) 8 tablet 3  . bictegravir-emtricitabine-tenofovir AF (BIKTARVY) 50-200-25 MG TABS tablet Take 1 tablet by mouth daily. Try to take at the same time each day with or without food. (Patient not taking: Reported on 08/26/2020) 30 tablet 3  . doxycycline (VIBRAMYCIN) 100 MG capsule Take 1 capsule (100 mg total) by mouth 2 (two)  times daily. (Patient not taking: Reported on 08/26/2020) 20 capsule 0  . DULoxetine (CYMBALTA) 30 MG capsule Take 1 capsule (30 mg total) by mouth daily for 7 days, THEN 2 capsules (60 mg total) daily. (Patient not taking: Reported on 01/14/2019) 60 capsule 1  . fluconazole (DIFLUCAN) 200 MG tablet Take 1 tablet (200 mg total) by mouth daily. (Patient not taking: Reported on 08/26/2020) 7 tablet 0  . hydrOXYzine (ATARAX/VISTARIL) 25 MG tablet Take 1 tablet (25 mg total) by mouth 3 (three) times daily as needed. (Patient not taking: Reported on 08/26/2020) 30 tablet 0  . ibuprofen (ADVIL) 200 MG tablet Take 200 mg by mouth every 6 (six) hours as needed for moderate pain. (Patient not taking: Reported on 08/26/2020)    . metroNIDAZOLE (FLAGYL) 500 MG tablet Take 1 tablet (500 mg total) by mouth 2 (two) times daily. (Patient not taking: Reported on 08/26/2020) 14 tablet 0  . mupirocin cream (BACTROBAN) 2 % Apply 1 application topically 2 (two) times daily. (Patient not taking: Reported on 08/26/2020) 15 g 0  . naphazoline (NAPHCON) 0.1 % ophthalmic solution Place 1 drop into both eyes 4 (four) times daily as needed for eye irritation. (Patient not taking: Reported on 08/26/2020) 15 mL 0  . ondansetron (ZOFRAN ODT) 8 MG disintegrating tablet 49m ODT q8 hours prn nausea (Patient not taking: Reported on 08/26/2020) 4 tablet 0  . ondansetron (ZOFRAN) 4 MG tablet Take 1 tablet (4 mg total) by mouth every 6 (six) hours as needed for nausea. (Patient not taking: Reported on 08/26/2020) 20 tablet 0  . sulfamethoxazole-trimethoprim (BACTRIM DS) 800-160 MG tablet Take 1 tablet by mouth daily. (Patient not taking: Reported on 08/26/2020) 30 tablet 3  . traZODone (DESYREL) 50 MG tablet Take 1-2 tablets (50-100 mg total) by mouth at bedtime. (Patient not taking: Reported on 08/26/2020) 60 tablet 2  . bictegravir-emtricitabine-tenofovir AF (BIKTARVY) 50-200-25 MG TABS tablet Take 1 tablet by mouth daily. 30 tablet 11  .  sulfamethoxazole-trimethoprim (BACTRIM DS) 800-160 MG tablet Take 1 tablet by mouth daily. (Patient not taking: Reported on 08/26/2020) 30 tablet 11   No facility-administered medications prior to visit.     Allergies  Allergen Reactions  . Bee Venom Anaphylaxis, Shortness Of Breath and Swelling  . Morphine And Related Hives and Itching    Just can't take "morphine", vicodin is OK    Social History   Tobacco Use  . Smoking status: Current Every Day Smoker    Packs/day: 0.10    Years: 3.00    Pack years: 0.30    Types: Cigarettes  . Smokeless tobacco: Never Used  . Tobacco comment: 1-2 a day  Vaping Use  . Vaping Use: Never used  Substance Use Topics  . Alcohol use: Yes    Alcohol/week: 0.0 standard drinks    Comment: Ocassionally  . Drug use:  Yes    Frequency: 2.0 times per week    Types: Marijuana, MDMA (Ecstacy)    Family History  Problem Relation Age of Onset  . Throat cancer Mother        smoker  . Throat cancer Father        smoker  . Hypertension Father   . Cirrhosis Father   . Hypertension Other   . Cancer Other        smoker  . Diabetes Other   . Asthma Other     Social History   Substance and Sexual Activity  Sexual Activity Yes  . Partners: Male  . Birth control/protection: Condom, Surgical   Comment: pt. given condoms     Objective:   Vitals:   08/26/20 0914  BP: 100/71  Pulse: 98  Resp: 16  Temp: 98.2 F (36.8 C)  SpO2: 100%  Weight: 118 lb (53.5 kg)  Height: _0  (1.549 m)   Body mass index is 22.3 kg/m.  Physical Exam Constitutional:      General: She is not in acute distress.    Appearance: She is ill-appearing.     Comments: Thin, lost a lot of weight.   Eyes:     General: No scleral icterus.    Pupils: Pupils are equal, round, and reactive to light.  Cardiovascular:     Rate and Rhythm: Regular rhythm. Tachycardia present.     Pulses: Normal pulses.     Heart sounds: No murmur heard.   Pulmonary:     Effort:  Pulmonary effort is normal.     Breath sounds: Normal breath sounds.  Abdominal:     Palpations: Abdomen is soft.     Tenderness: There is abdominal tenderness (lower abdomen and pubic area).       Comments: Skin is macerated with greyish appearance. Few scattered opened ulcerations without induration or fluctuance.   Her mons pubis is swollen as is labia today. No evidence of abscess as this is all very soft.   Genitourinary:    Pubic Area: Rash present.       Comments: Areas of chronic ulcerations. Macerated skin with greyish appearance.  Musculoskeletal:     Cervical back: Normal range of motion. No rigidity.  Lymphadenopathy:     Cervical: Cervical adenopathy present.  Skin:    Capillary Refill: Capillary refill takes less than 2 seconds.     Comments: Multiple dark nodular lesions and open non-healing ulcerations on the legs, thighs and buttocks.   Neurological:     Mental Status: She is alert and oriented to person, place, and time.  Psychiatric:     Comments: Tearful, anxious            Lab Results Lab Results  Component Value Date   WBC 5.7 07/25/2020   HGB 10.1 (L) 07/25/2020   HCT 33.4 (L) 07/25/2020   MCV 84.6 07/25/2020   PLT 140 (L) 07/25/2020    Lab Results  Component Value Date   CREATININE 0.63 07/25/2020   BUN 15 07/25/2020   NA 134 (L) 07/25/2020   K 3.2 (L) 07/25/2020   CL 104 07/25/2020   CO2 22 07/25/2020    Lab Results  Component Value Date   ALT 15 07/25/2020   AST 29 07/25/2020   ALKPHOS 94 07/25/2020   BILITOT 0.6 07/25/2020    Lab Results  Component Value Date   CHOL 146 06/10/2016   HDL 72 06/10/2016   LDLCALC 63 06/10/2016   TRIG  56 06/10/2016   CHOLHDL 2.0 06/10/2016   HIV 1 RNA Quant (copies/mL)  Date Value  12/26/2019 60,200 (H)  04/11/2018 95 (H)  06/10/2016 49 (H)   CD4 T Cell Abs (/uL)  Date Value  12/26/2019 <35 (L)  04/06/2018 20 (L)  11/11/2016 51 (L)     Assessment & Plan:   Patient Active  Problem List   Diagnosis Date Noted  . Nausea & vomiting 05/25/2018  . Ulceration, vulva 03/10/2018  . Itching 03/10/2018  . 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 Pancytopenia ([place]) 01/09/2016  . Non-healing skin lesion 05/06/2015  . Homeless 03/19/2015  . 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 Depression 10/17/2013  . Anemia 10/17/2013  . Thrombocytopenia ([place]) 10/17/2013  . Cigarette smoker 10/17/2013  . Asthma 10/17/2013  . Encounter for general adult medical examination without abnormal findings 06/12/2011  . 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 Anxiety and depression 07/17/2008  . 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 Feet are warm and well perfused. No wounds to feet but multiple overlying legs.  Will need to add back Gabapentin TID for her and gain control over HIV viremia to help.  Would also check B12, folate, thiamine to consider possible other causes (I don't know if alcohol is playing a role now or in the past).       Nausea & vomiting    Unrelieved with Phenergan at home. ?MAC.       CIN III with severe dysplasia    Potential for possible advancing disease. Will see what CT scan shows       AIDS (acquired immune deficiency syndrome) ([place])    Beverly [name] is here with multiple concerns for opportunistic process given her severely low immune system. She has fevers, weight loss, nausea, abdominal pain and diarrhea with poor appetite. 20# weight loss over the last month. On exam she has tender abdomen (mostly due to non-healing ulcerated skin lesions along stomach / groin), lymphadenopathy detected in head/neck, alopecia and multiple non-healing wounds from previous bed bug exposure 29mago.  Will D/W admitting team to see if we can directly admit her to the hospital to  expedite process. 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   Needs CT scan w/ and w/o contrast abdomen / pelvis to help with work up. Toxo IgG and  AFB blood cultures today in clinic along with CD4 (which will be < 35) and viral load with genotype. Will leave other stat labs to be drawn in ER at their assessment. May need lymph node biopsy at some point if this is visualized.   Headaches - these are new but controlled with OTC medication. However paired with the weakness in the legs and falls she will need MRI of the brain +/- LP (no signs of meningismus today so maybe just MRI sufficient with LP follow up if needed) to evaluate potential for CNS infection / process.  Continue Bactrim 1 DS daily for PJP prophylaxis. Hold ART until head imaging to ensure OK to resume. She was previously on Biktarvy once daily -  has been out for months.   Will write her out of work for 1 week and then return TBD the above.       Relevant Orders   HIV RNA, RTPCR W/R GT (RTI, PI,INT)   Toxoplasma gondii antibody, IgG    Other Visit Diagnoses    Symptomatic HIV infection ([place])    -  Primary   Relevant Orders   HIV-1 RNA quant-no reflex-bld   T-helper cell (CD4)- ([facility] clinic only)   RPR   COMPLETE METABOLIC PANEL WITH GFR   CBC with Differential/Platelet   AFB culture, blood      Janene Madeira, MSN, NP-C [facility] for Infectious Disease [place] Pager: [phone] Office: [phone]  08/26/20  10:10 AM

## 2020-08-26 NOTE — Patient Instructions (Addendum)
Please go to [facility] Emergency Room to register to be seen. You should not have a long wait from there.   I have also discussed with our ID team and the hospitalist team that you need to be admitted.   Will get you a letter to see if we can get you out of work for 1 week.

## 2020-08-26 NOTE — ED Triage Notes (Signed)
Pt sent from ID clinic for further eval of generalized abdominal pain x 3 days with associated n/v/d. Also reports frequent falls at work, thinks she has some nerve damage in her feet. Chronic ulcers covering her legs.

## 2020-08-26 NOTE — ED Provider Notes (Signed)
[facility]PART[facility] Provider Note   CSN: [id number] Arrival date & time: 08/26/20  1120     History Chief Complaint  Patient presents with  . Abdominal Pain  . Fall    Beverly [name] is a 40 y.o. female.  Patient presents from infectious disease clinic.  She has a history of HIV and presents with generalized abdominal pain and lower extremity wounds for several months.  She was seen in the clinic today and sent to the ER to obtain some lab work and a CT image as well as admit the patient to the hospital.  He otherwise denies fevers no cough no vomiting no loose of breath.        Past Medical History:  Diagnosis Date  . Abscess   . AIDS ([place]) 03/19/2015  . Anxiety   . Asthma   . Blood transfusion without reported diagnosis   . Depression   . HIV infection ([place])   . Homeless 03/19/2015   states stays with family or friends. Does not rent or own a home, but does not live on streets.  . 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 Leg lesion 03/19/2015  . Major depression, recurrent ([place]) 03/19/2015  . 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 Skin ulcer ([place]) 05/06/2015    Patient Active Problem List   Diagnosis Date Noted  . Nausea & vomiting 05/25/2018  . Ulceration, vulva 03/10/2018  . Itching 03/10/2018  . 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 Pancytopenia ([place]) 01/09/2016  . Non-healing skin lesion 05/06/2015  . Homeless 03/19/2015  . 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 Depression 10/17/2013  . Anemia 10/17/2013  . Thrombocytopenia ([place]) 10/17/2013  . Cigarette smoker 10/17/2013  . Asthma 10/17/2013  . Encounter for general adult medical examination without abnormal findings 06/12/2011  . 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 Anxiety and depression 07/17/2008  . AIDS (acquired immune deficiency syndrome) ([place]) 06/28/2008    Past Surgical History:  Procedure Laterality Date  . CERVICAL  CONIZATION W/BX N/A 04/14/2018   Procedure: COLD KNIFE CONIZATION CERVIX WITH BIOPSY;  Surgeon: Isabel Caprice, MD;  Location: [facility];  Service: Gynecology;  Laterality: N/A;  . CESAREAN SECTION    . DILATION AND CURETTAGE OF UTERUS N/A 04/14/2018   Procedure: ENDOCERVICAL CURETTAGE;  Surgeon: Isabel Caprice, MD;  [facility];  Service: Gynecology;  Laterality: N/A;  . INCISION AND DRAINAGE ABSCESS Right 01/10/2016   Procedure: INCISION AND DRAINAGE RIGHT BUTTOCK, LEFT LABIAL , EXCISION AND DRAINAGE OF  RIGHT AXILLARY ABSCESS;  Surgeon: Excell Seltzer, MD;  Location: [facility];  Service: General;  Laterality: Right;  . TUBAL LIGATION  2005  . VULVA [place] BIOPSY N/A 04/14/2018   Procedure: VULVAR BIOPSIES;  Surgeon: Isabel Caprice, MD;  Location: [facility];  Service: Gynecology;  Laterality: N/A;     OB History    Gravida  1   Para      Term      Preterm      AB      Living        SAB      IAB      Ectopic      Multiple      Live Births              Family History  Problem Relation Age of Onset  .  Throat cancer Mother        smoker  . Throat cancer Father        smoker  . Hypertension Father   . Cirrhosis Father   . Hypertension Other   . Cancer Other        smoker  . Diabetes Other   . Asthma Other     Social History   Tobacco Use  . Smoking status: Current Every Day Smoker    Packs/day: 0.10    Years: 3.00    Pack years: 0.30    Types: Cigarettes  . Smokeless tobacco: Never Used  . Tobacco comment: 1-2 a day  Vaping Use  . Vaping Use: Never used  Substance Use Topics  . Alcohol use: Yes    Alcohol/week: 0.0 standard drinks    Comment: Ocassionally  . Drug use: Yes    Frequency: 2.0 times per week    Types: Marijuana, MDMA (Ecstacy)    Home Medications Prior to Admission medications   Medication Sig Start Date End Date Taking? Authorizing Provider  azithromycin  (ZITHROMAX) 600 MG tablet Take 2 tablets (1,200 mg total) by mouth once a week. Patient not taking: Reported on 08/26/2020 02/22/19   [place] Callas, NP  bictegravir-emtricitabine-tenofovir AF (BIKTARVY) 50-200-25 MG TABS tablet Take 1 tablet by mouth daily. Try to take at the same time each day with or without food. Patient not taking: Reported on 08/26/2020 02/22/19   [place] Callas, NP  doxycycline (VIBRAMYCIN) 100 MG capsule Take 1 capsule (100 mg total) by mouth 2 (two) times daily. Patient not taking: Reported on 08/26/2020 04/05/19   Frederica Kuster, PA-C  DULoxetine (CYMBALTA) 30 MG capsule Take 1 capsule (30 mg total) by mouth daily for 7 days, THEN 2 capsules (60 mg total) daily. Patient not taking: Reported on 01/14/2019 05/04/18 09/03/18  [place] Callas, NP  fluconazole (DIFLUCAN) 200 MG tablet Take 1 tablet (200 mg total) by mouth daily. Patient not taking: Reported on 08/26/2020 12/26/19   [place] Callas, NP  hydrOXYzine (ATARAX/VISTARIL) 25 MG tablet Take 1 tablet (25 mg total) by mouth 3 (three) times daily as needed. Patient not taking: Reported on 08/26/2020 03/10/18   [place] Callas, NP  ibuprofen (ADVIL) 200 MG tablet Take 200 mg by mouth every 6 (six) hours as needed for moderate pain. Patient not taking: Reported on 08/26/2020    [provider]  metroNIDAZOLE (FLAGYL) 500 MG tablet Take 1 tablet (500 mg total) by mouth 2 (two) times daily. Patient not taking: Reported on 08/26/2020 04/05/19   Frederica Kuster, PA-C  mupirocin cream (BACTROBAN) 2 % Apply 1 application topically 2 (two) times daily. Patient not taking: Reported on 08/26/2020 12/07/19   Palumbo, April, MD  naphazoline [facility]) 0.1 % ophthalmic solution Place 1 drop into both eyes 4 (four) times daily as needed for eye irritation. Patient not taking: Reported on 08/26/2020 09/03/18   Robinson, [place] N, PA-C  ondansetron (ZOFRAN ODT) 8 MG disintegrating tablet 8mg  ODT q8 hours prn nausea Patient not taking:  Reported on 08/26/2020 07/26/20   Palumbo, April, MD  ondansetron (ZOFRAN) 4 MG tablet Take 1 tablet (4 mg total) by mouth every 6 (six) hours as needed for nausea. Patient not taking: Reported on 08/26/2020 05/26/18   Aline August, MD  sulfamethoxazole-trimethoprim (BACTRIM DS) 800-160 MG tablet Take 1 tablet by mouth daily. Patient not taking: Reported on 08/26/2020 02/22/19   [place] Callas, NP  traZODone (DESYREL) 50 MG tablet  Take 1-2 tablets (50-100 mg total) by mouth at bedtime. Patient not taking: Reported on 08/26/2020 04/11/18   [place] Callas, NP    Allergies    Bee venom and Morphine and related  Review of Systems   Review of Systems  Constitutional: Negative for fever.  HENT: Negative for ear pain.   Eyes: Negative for pain.  Respiratory: Negative for cough.   Cardiovascular: Negative for chest pain.  Gastrointestinal: Positive for abdominal pain.  Genitourinary: Negative for flank pain.  Musculoskeletal: Negative for back pain.  Skin: Positive for wound. Negative for rash.  Neurological: Negative for headaches.    Physical Exam Updated Vital Signs BP 125/83   Pulse 81   Temp 98.1 F (36.7 C)   Resp 20   LMP 08/12/2020   SpO2 100%   Physical Exam Constitutional:      General: She is not in acute distress.    Appearance: Normal appearance.  HENT:     Head: Normocephalic.     Nose: Nose normal.  Eyes:     Extraocular Movements: Extraocular movements intact.  Cardiovascular:     Rate and Rhythm: Normal rate.  Pulmonary:     Effort: Pulmonary effort is normal.  Musculoskeletal:        General: Normal range of motion.     Cervical back: Normal range of motion.  Skin:    Comments: Chronic appearing lower extremity wounds.  No clinical evidence of active cellulitis in the lower extremity wounds.  Patient has a bandlike area of erythema and ulceration in her lower abdomen tender to palpation with surrounding erythema approximately 20 x 3 cm.  Neurological:      General: No focal deficit present.     Mental Status: She is alert. Mental status is at baseline.     ED Results / Procedures / Treatments   Labs (all labs ordered are listed, but only abnormal results are displayed) Labs Reviewed  COMPREHENSIVE METABOLIC PANEL - Abnormal; Notable for the following components:      Result Value   Potassium 3.3 (*)    Calcium 8.0 (*)    Total Protein 8.4 (*)    Albumin 2.7 (*)    AST 43 (*)    All other components within normal limits  CBC - Abnormal; Notable for the following components:   WBC 3.1 (*)    RBC 3.75 (*)    Hemoglobin 9.7 (*)    HCT 31.6 (*)    MCH 25.9 (*)    RDW 17.2 (*)    Platelets 91 (*)    All other components within normal limits  URINALYSIS, ROUTINE W REFLEX MICROSCOPIC - Abnormal; Notable for the following components:   Color, Urine AMBER (*)    APPearance HAZY (*)    Hgb urine dipstick MODERATE (*)    Protein, ur 30 (*)    Leukocytes,Ua LARGE (*)    RBC / HPF >50 (*)    WBC, UA >50 (*)    Bacteria, UA MANY (*)    All other components within normal limits  CULTURE, BLOOD (ROUTINE X 2)  CULTURE, BLOOD (ROUTINE X 2)  LIPASE, BLOOD  RAPID URINE DRUG SCREEN, HOSP PERFORMED  I-STAT BETA HCG BLOOD, ED ([facility], [facility], [facility] ONLY)    EKG None  Radiology CT Abdomen Pelvis W Contrast  Result Date: 08/26/2020 CLINICAL DATA:  Acute nonlocalized abdominal pain. Lower abdominal pain. EXAM: CT ABDOMEN AND PELVIS WITH CONTRAST TECHNIQUE: Multidetector CT imaging of the abdomen and pelvis  was performed using the standard protocol following bolus administration of intravenous contrast. CONTRAST:  128mL OMNIPAQUE IOHEXOL 300 MG/ML  SOLN COMPARISON:  CT 04/05/2019 FINDINGS: Lower chest: The lung bases are clear. Hepatobiliary: Focal fatty infiltration adjacent to the falciform ligament. No suspicious hepatic lesion. Gallbladder physiologically distended, no calcified stone. No biliary dilatation. Pancreas: Unremarkable. No pancreatic ductal  dilatation or surrounding inflammatory changes. Spleen: Normal in size without focal abnormality. Adrenals/Urinary Tract: Normal adrenal glands. Small bilateral nonobstructing renal calculi. No hydronephrosis or perinephric edema. Small low-density lesion in the medial right kidney is too small to characterize but likely small cyst. Urinary bladder is unremarkable. Stomach/Bowel: Bowel evaluation is limited in the absence of enteric contrast. Decompressed stomach. No small bowel obstruction or inflammation. The appendix is normal, decreased appendiceal caliber from prior exam. Majority of the colon is decompressed which limits assessment. There is no obvious colonic wall thickening or pericolonic edema. Sigmoid colon mildly tortuous. Vascular/Lymphatic: Prominent borderline and mildly enlarged lymph nodes in the external iliac and inguinal stations, more so on the right. Largest node in the right external iliac station measures 12 mm short axis. There are multiple small retroperitoneal nodes, all subcentimeter. Lymph node size has improved from 2020 exam. Abdominal aorta is normal in caliber. Patent portal vein. Reproductive: Unremarkable uterus. Physiologic follicular cyst in the right ovary less than 3 cm. There is no suspicious adnexal mass. Other: Minimal free fluid in the pelvis. No upper abdominal ascites. No free air. There is skin thickening with mild subcutaneous edema involving the lower anterior abdominal wall, for example series 3, image 60. No soft tissue air. Musculoskeletal: There are no acute or suspicious osseous abnormalities. IMPRESSION: 1. Skin thickening with mild subcutaneous edema involving the lower anterior abdominal wall, suspicious for cellulitis. Recommend correlation with physical exam. 2. Prominent borderline and mildly enlarged lymph nodes in the external iliac and inguinal stations, more so on the right, likely reactive or related to HIV. Lymph node size has decreased from 2020 CT. 3.  Bilateral nonobstructing renal calculi. Electronically Signed   By: Keith Rake M.D.   On: 08/26/2020 17:20    Procedures Procedures   Medications Ordered in ED Medications  cefTRIAXone (ROCEPHIN) 1 g in sodium chloride 0.9 % 100 mL IVPB (1 g Intravenous New Bag/Given 08/26/20 1640)  iohexol (OMNIPAQUE) 300 MG/ML solution 100 mL (100 mLs Intravenous Contrast Given 08/26/20 1711)    ED Course  I have reviewed the triage vital signs and the nursing notes.  Pertinent labs & imaging results that were available during my care of the patient were reviewed by me and considered in my medical decision making (see chart for details).    MDM Rules/Calculators/A&P                          Patient has a urinary tract infection on evaluation.  Started on Rocephin 1 g IV.  Case discussed with medicine for admission.  Final Clinical Impression(s) / ED Diagnoses Final diagnoses:  HIV disease ([place])  Urinary tract infection with hematuria, site unspecified  Cellulitis of abdominal wall    Rx / DC Orders ED Discharge Orders    None       Luna Fuse, MD 08/26/20 [phone]

## 2020-08-26 NOTE — Assessment & Plan Note (Signed)
Unrelieved with Phenergan at home. ?MAC.

## 2020-08-26 NOTE — Assessment & Plan Note (Signed)
Potential for possible advancing disease. Will see what CT scan shows

## 2020-08-26 NOTE — ED Notes (Signed)
Pt transferred to a hospital bed. Pt resting. Even Resp. No distress noted at this time. Will continue to monitor.

## 2020-08-26 NOTE — Assessment & Plan Note (Addendum)
Worsening now including falling / buckling and severe pain. Feet are warm and well perfused. No wounds to feet but multiple overlying legs.  Will need to add back Gabapentin TID for her and gain control over HIV viremia to help.  Would also check B12, folate, thiamine to consider possible other causes (I don't know if alcohol is playing a role now or in the past).

## 2020-08-27 ENCOUNTER — Inpatient Hospital Stay (HOSPITAL_COMMUNITY): Payer: Medicaid Other

## 2020-08-27 ENCOUNTER — Encounter (HOSPITAL_COMMUNITY): Payer: Self-pay | Admitting: Internal Medicine

## 2020-08-27 DIAGNOSIS — F17210 Nicotine dependence, cigarettes, uncomplicated: Secondary | ICD-10-CM | POA: Diagnosis present

## 2020-08-27 DIAGNOSIS — R591 Generalized enlarged lymph nodes: Secondary | ICD-10-CM | POA: Diagnosis not present

## 2020-08-27 DIAGNOSIS — R64 Cachexia: Secondary | ICD-10-CM | POA: Diagnosis present

## 2020-08-27 DIAGNOSIS — R296 Repeated falls: Secondary | ICD-10-CM | POA: Diagnosis present

## 2020-08-27 DIAGNOSIS — G4700 Insomnia, unspecified: Secondary | ICD-10-CM | POA: Diagnosis present

## 2020-08-27 DIAGNOSIS — R519 Headache, unspecified: Secondary | ICD-10-CM | POA: Diagnosis not present

## 2020-08-27 DIAGNOSIS — R238 Other skin changes: Secondary | ICD-10-CM | POA: Diagnosis present

## 2020-08-27 DIAGNOSIS — D638 Anemia in other chronic diseases classified elsewhere: Secondary | ICD-10-CM | POA: Diagnosis present

## 2020-08-27 DIAGNOSIS — F32A Depression, unspecified: Secondary | ICD-10-CM | POA: Diagnosis present

## 2020-08-27 DIAGNOSIS — R634 Abnormal weight loss: Secondary | ICD-10-CM | POA: Diagnosis not present

## 2020-08-27 DIAGNOSIS — D72819 Decreased white blood cell count, unspecified: Secondary | ICD-10-CM | POA: Diagnosis present

## 2020-08-27 DIAGNOSIS — B20 Human immunodeficiency virus [HIV] disease: Secondary | ICD-10-CM | POA: Diagnosis present

## 2020-08-27 DIAGNOSIS — K5289 Other specified noninfective gastroenteritis and colitis: Secondary | ICD-10-CM

## 2020-08-27 DIAGNOSIS — Z609 Problem related to social environment, unspecified: Secondary | ICD-10-CM | POA: Diagnosis present

## 2020-08-27 DIAGNOSIS — A312 Disseminated mycobacterium avium-intracellulare complex (DMAC): Secondary | ICD-10-CM | POA: Diagnosis present

## 2020-08-27 DIAGNOSIS — E440 Moderate protein-calorie malnutrition: Secondary | ICD-10-CM | POA: Diagnosis present

## 2020-08-27 DIAGNOSIS — N766 Ulceration of vulva: Secondary | ICD-10-CM | POA: Diagnosis not present

## 2020-08-27 DIAGNOSIS — R627 Adult failure to thrive: Secondary | ICD-10-CM | POA: Diagnosis present

## 2020-08-27 DIAGNOSIS — N879 Dysplasia of cervix uteri, unspecified: Secondary | ICD-10-CM | POA: Diagnosis present

## 2020-08-27 DIAGNOSIS — R21 Rash and other nonspecific skin eruption: Secondary | ICD-10-CM

## 2020-08-27 DIAGNOSIS — N390 Urinary tract infection, site not specified: Secondary | ICD-10-CM | POA: Diagnosis present

## 2020-08-27 DIAGNOSIS — L03311 Cellulitis of abdominal wall: Secondary | ICD-10-CM

## 2020-08-27 DIAGNOSIS — Z9119 Patient's noncompliance with other medical treatment and regimen: Secondary | ICD-10-CM | POA: Diagnosis not present

## 2020-08-27 DIAGNOSIS — C539 Malignant neoplasm of cervix uteri, unspecified: Secondary | ICD-10-CM | POA: Diagnosis present

## 2020-08-27 DIAGNOSIS — E876 Hypokalemia: Secondary | ICD-10-CM | POA: Diagnosis present

## 2020-08-27 DIAGNOSIS — E860 Dehydration: Secondary | ICD-10-CM | POA: Diagnosis present

## 2020-08-27 DIAGNOSIS — F411 Generalized anxiety disorder: Secondary | ICD-10-CM | POA: Diagnosis present

## 2020-08-27 DIAGNOSIS — A6000 Herpesviral infection of urogenital system, unspecified: Secondary | ICD-10-CM | POA: Diagnosis not present

## 2020-08-27 DIAGNOSIS — G629 Polyneuropathy, unspecified: Secondary | ICD-10-CM | POA: Diagnosis present

## 2020-08-27 DIAGNOSIS — N200 Calculus of kidney: Secondary | ICD-10-CM | POA: Diagnosis present

## 2020-08-27 LAB — TSH: TSH: 2.443 u[IU]/mL (ref 0.350–4.500)

## 2020-08-27 LAB — IRON AND TIBC
Iron: 40 ug/dL (ref 28–170)
Saturation Ratios: 15 % (ref 10.4–31.8)
TIBC: 260 ug/dL (ref 250–450)
UIBC: 220 ug/dL

## 2020-08-27 LAB — FERRITIN: Ferritin: 58 ng/mL (ref 11–307)

## 2020-08-27 LAB — CRYPTOCOCCAL ANTIGEN: Crypto Ag: NEGATIVE

## 2020-08-27 LAB — GC/CHLAMYDIA PROBE AMP (~~LOC~~) NOT AT ARMC
Chlamydia: NEGATIVE
Comment: NEGATIVE
Comment: NORMAL
Neisseria Gonorrhea: NEGATIVE

## 2020-08-27 LAB — MAGNESIUM: Magnesium: 1.5 mg/dL — ABNORMAL LOW (ref 1.7–2.4)

## 2020-08-27 LAB — COMPREHENSIVE METABOLIC PANEL
ALT: 14 U/L (ref 0–44)
AST: 38 U/L (ref 15–41)
Albumin: 2.2 g/dL — ABNORMAL LOW (ref 3.5–5.0)
Alkaline Phosphatase: 59 U/L (ref 38–126)
Anion gap: 5 (ref 5–15)
BUN: 7 mg/dL (ref 6–20)
CO2: 22 mmol/L (ref 22–32)
Calcium: 7.3 mg/dL — ABNORMAL LOW (ref 8.9–10.3)
Chloride: 109 mmol/L (ref 98–111)
Creatinine, Ser: 0.74 mg/dL (ref 0.44–1.00)
GFR, Estimated: >60 mL/min (ref >60)
Glucose, Bld: 77 mg/dL (ref 70–99)
Potassium: 3.5 mmol/L (ref 3.5–5.1)
Sodium: 136 mmol/L (ref 135–145)
Total Bilirubin: 0.4 mg/dL (ref 0.3–1.2)
Total Protein: 7 g/dL (ref 6.5–8.1)

## 2020-08-27 LAB — RAPID URINE DRUG SCREEN, HOSP PERFORMED
Amphetamines: POSITIVE — AB
Barbiturates: NOT DETECTED
Benzodiazepines: NOT DETECTED
Cocaine: NOT DETECTED
Opiates: NOT DETECTED
Tetrahydrocannabinol: POSITIVE — AB

## 2020-08-27 LAB — HEPATITIS C ANTIBODY: HCV Ab: NONREACTIVE

## 2020-08-27 LAB — LACTATE DEHYDROGENASE: LDH: 334 U/L — ABNORMAL HIGH (ref 98–192)

## 2020-08-27 LAB — HEPATITIS B CORE ANTIBODY, IGM: Hep B C IgM: NONREACTIVE

## 2020-08-27 LAB — HEPATITIS B CORE ANTIBODY, TOTAL: Hep B Core Total Ab: NONREACTIVE

## 2020-08-27 MED ORDER — THIAMINE HCL 100 MG/ML IJ SOLN
INTRAVENOUS | Status: DC
  Filled 2020-08-27 (×8): qty 1000

## 2020-08-27 MED ORDER — FENTANYL CITRATE (PF) 100 MCG/2ML IJ SOLN
12.5000 ug | INTRAMUSCULAR | Status: DC | PRN
  Administered 2020-08-27 (×2): 12.5 ug via INTRAVENOUS
  Filled 2020-08-27 (×2): qty 2

## 2020-08-27 MED ORDER — MAGNESIUM SULFATE 50 % IJ SOLN
3.0000 g | Freq: Once | INTRAVENOUS | Status: AC
  Administered 2020-08-27: 3 g via INTRAVENOUS
  Filled 2020-08-27: qty 6

## 2020-08-27 MED ORDER — OXYCODONE-ACETAMINOPHEN 5-325 MG PO TABS
1.0000 | ORAL_TABLET | ORAL | Status: DC | PRN
  Administered 2020-08-27 – 2020-08-30 (×7): 1 via ORAL
  Filled 2020-08-27 (×7): qty 1

## 2020-08-27 MED ORDER — FENTANYL CITRATE (PF) 100 MCG/2ML IJ SOLN: 12.5000 ug | Freq: Four times a day (QID) | INTRAMUSCULAR | Status: DC | PRN

## 2020-08-27 MED ORDER — GADOBUTROL 1 MMOL/ML IV SOLN
5.5000 mL | Freq: Once | INTRAVENOUS | Status: AC | PRN
  Administered 2020-08-27: 5.5 mL via INTRAVENOUS

## 2020-08-27 MED ORDER — VALACYCLOVIR HCL 500 MG PO TABS
500.0000 mg | ORAL_TABLET | Freq: Two times a day (BID) | ORAL | Status: DC
  Administered 2020-08-27 – 2020-08-30 (×7): 500 mg via ORAL
  Filled 2020-08-27 (×7): qty 1

## 2020-08-27 MED ORDER — IOHEXOL 300 MG/ML  SOLN
75.0000 mL | Freq: Once | INTRAMUSCULAR | Status: AC | PRN
  Administered 2020-08-27: 75 mL via INTRAVENOUS

## 2020-08-27 MED ORDER — SODIUM CHLORIDE 0.9 % IV SOLN
1.0000 g | INTRAVENOUS | Status: DC
  Administered 2020-08-27: 1 g via INTRAVENOUS
  Filled 2020-08-27: qty 1
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
## 2020-08-27 NOTE — Consult Note (Addendum)
[name]ious Disease    Date of Admission:  08/26/2020     Reason for Consult: AIDS, rash, diarrhea    Referring Provider: Verlon Au Primary Care Provider: Janene Madeira    Lines:  Peripheral IV  Abx: 3/08-c valacyclovir  3/07-8 bactrim ds 1 tab daily 3/07 once ceftriaxone        Assessment: 40 yo female with aids (previously on biktarvy/bactrim prophy but not currently on any medication for 3-4 months pta) admitted for failure to thrive/weight loss, chronic intermittent diarrhea, fever, and skin rash  hiv             vl    /    cd4 (%) 12/26/19  60k   /    <35 03/2018   95     /      20 05/2016   49     /      51 12/2015  338k  /      10   3/07 blood cx negative  3/07 abd/pelv ct mentioned renal calculi (nonobstructed) and inguinal/iliac LAD (improved from 2020 3/07 cxr clear  No sepsis here so far.   #b sx #skin rash #lad #diarrhea #headache Multiple AIDS associated ID complication/malignancy possible in the current context of her symptoms (rash, LAD, weight loss, fever). Kaposi/bacilliary angiomatosis, mac, disseminated fungal infection such as crypto, syphilis all possible. -The rash seems to be 2 processes -- the inguinal/suprapubic ulceration will r/o hsv. The papular rash could be syphilis, kaposi/bacilliary angiomatosis/fungal/cutaneous leukemia. Likely will start ART soon if no tb/cryptococcal meningitis are present. If the papular rash don't improve (and rpr negative), could consider getting biopsy at that time -Will w/u diarrhea with gi pcr (cryptosporidiosis, giardia, mac/cancer/fungal/cmv). If negative and sx worse, will probably need endoscopy input (suspect outpatient process). -LAD tb/fungal/mac/leukemia/kaposi. Will start with LDH and get chest ct as well  -The headache will send for crypto serology, if negative could monitor unless more persistent will get brain mri/lumbar puncture   #cervical SCC/CIN Patient hadn't f/u  with outpatient pelvis mri or gynecology evaluation Will arrange pelvis mri here and refer her again for outpatient gyn unless mri is concerning   Plan:  1. Chest ct with contrast 2. Send hiv pcr/cd4, integrase resistance testing, genotype 3. Endemic fungi serology, quantiferon gold, crypto Ag 4. Blood afb cx (for disseminated mac) 5. RPR, sentout for tppa 6. Send gi panel pcr 7. Swab for hsv pcr left groin ulcer, vaginal swab for gc/chlam/trich 8. LDH 9. Routine health care check for hepatitis b&c 10. Depending on chest ct finding will see if further diagnostic testings are needed 11. Hold any art and prophy for now until active infection r/o'ed 12. Pelvis mri with contrast for staging of her cervical CIN/scc 13. Start valacyclovir 500 mg po bid  Active Problems:   Urinary tract infection with hematuria   Weakness   Scheduled Meds: . feeding supplement  237 mL Oral BID BM  . sulfamethoxazole-trimethoprim  1 tablet Oral Daily   Continuous Infusions: . magnesium sulfate bolus IVPB 3 g (08/27/20 1012)  . banana bag IV 1000 mL 125 mL/hr at 08/27/20 0342   PRN Meds:.acetaminophen **OR** acetaminophen, fentaNYL (SUBLIMAZE) injection, ibuprofen, ondansetron, oxyCODONE-acetaminophen, triamcinolone ointment  HPI: Beverly [name] is a 40 y.o. female homeless, AIDs previously on ART but stopped due to personal reason, admitted for failure to thrive and multiple complaints including painful rash/genital ulcer, diarrhea/abd pain, weight loss  She  follows Janene Madeira for her HIV care, and was seen yesterday. Ms Doren Custard had advised patient to come to ed for admission due to several complaints in setting AIDS  Patient reports several weeks if not longer of abd discomfort lower quadrants, intermittent watery diarrhea, poor appetite/intake, weight loss and painful/itchy rash all over her body, expecially painful in the groin/lower abdomen  She denies f/c/nightsweat, joint pain, cough,  chest pain, sob, vaginal discharge, dysuria  She thinks the rash are started by flea bite.   Other sx: She complains of headache frontal but said this is only when she has an anxiety attack. It is not daily or even weekly. It goes away as her anxiety goes away No associated visual change  She has been homeless for just over a year. However, she was able to live with friends here and there and for a few months had to live in her car. Recently she is living with another friend. She just got a new job working for [employer]  She got hiv from her previous partner (who had died and who with her gave birth to now a 31's yo son). She was previously on ART but due to depression/social situation recently had not regularly took them and for the past 4 months before admission not taken them at all (biktarvy/bactrim)  She told me she was dx'ed in 2015  hiv             vl    /    cd4 (%) 12/26/19  60k   /    <35 03/2018   95     /      20 05/2016   49     /      51 12/2015  338k  /      10  She is not currently sexually active for a few years now and is not in a relationship.  Her depression is there but she is "managing it ok." she does have insomnia and are anxious at times. She has no panic attack. No si/hi   Review of Systems: ROS Other ros negative  Past Medical History:  Diagnosis Date  . Abscess   . AIDS ([place]) 03/19/2015  . Anxiety   . Asthma   . Blood transfusion without reported diagnosis   . Depression   . HIV infection ([place])   . Homeless 03/19/2015   states stays with family or friends. Does not rent or own a home, but does not live on streets.  . 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 Leg lesion 03/19/2015  . Major depression, recurrent ([place]) 03/19/2015  . 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 Skin ulcer ([place]) 05/06/2015    Social History   Tobacco Use  . Smoking status: Current Every Day Smoker    Packs/day: 0.10    Years: 3.00    Pack years: 0.30     Types: Cigarettes  . Smokeless tobacco: Never Used  . Tobacco comment: 1-2 a day  Vaping Use  . Vaping Use: Never used  Substance Use Topics  . Alcohol use: Yes    Alcohol/week: 0.0 standard drinks    Comment: Ocassionally  . Drug use: Yes    Frequency: 2.0 times per week    Types: Marijuana, MDMA ([place]stacy)    Family History  Problem Relation Age of Onset  . Throat cancer Mother        smoker  . Throat cancer Father  smoker  . Hypertension Father   . Cirrhosis Father   . Hypertension Other   . Cancer Other        smoker  . Diabetes Other   . Asthma Other    Allergies  Allergen Reactions  . Bee Venom Anaphylaxis, Shortness Of Breath and Swelling  . Morphine And Related Hives and Itching    Just can't take "morphine", vicodin is OK    OBJECTIVE: Blood pressure 107/71, pulse 82, temperature 98.1 F (36.7 C), temperature source Oral, resp. rate 18, height _0  (1.549 m), weight 52.7 kg, last menstrual period 08/12/2020, SpO2 98 %.  Physical Exam Constitutional:      Comments: Some distress as she try to move around in bed, complaining of groin/lower abd discomfort)  HENT:     Head: Normocephalic.     Mouth/Throat:     Mouth: Mucous membranes are moist.     Pharynx: Oropharynx is clear. No oropharyngeal exudate.  Cardiovascular:     Rate and Rhythm: Normal rate and regular rhythm.     Heart sounds: Normal heart sounds.  Pulmonary:     Effort: Pulmonary effort is normal.     Breath sounds: Normal breath sounds.  Abdominal:     General: There is no distension.     Palpations: Abdomen is soft.     Tenderness: There is no abdominal tenderness.  Genitourinary:    Comments: See skin exam Skin:    General: Skin is warm and dry.     Comments: Multiple hyperpigmented papular rash on extremities/trunk, and a few on face. Some are macerated/scabbed over  Bilateral L>G groin/vulva ulcer tender to touch; suprapubic area also large tender ulcer. both with  surrounding mild swelling    Neurological:     General: No focal deficit present.     Mental Status: She is alert and oriented to person, place, and time.     Cranial Nerves: No cranial nerve deficit.  Psychiatric:        Mood and Affect: Mood is depressed.        Behavior: Behavior normal.     Lab Results Lab Results  Component Value Date   WBC 3.1 (L) 08/26/2020   HGB 9.7 (L) 08/26/2020   HCT 31.6 (L) 08/26/2020   MCV 84.3 08/26/2020   PLT 91 (L) 08/26/2020    Lab Results  Component Value Date   CREATININE 0.74 08/27/2020   BUN 7 08/27/2020   NA 136 08/27/2020   K 3.5 08/27/2020   CL 109 08/27/2020   CO2 22 08/27/2020    Lab Results  Component Value Date   ALT 14 08/27/2020   AST 38 08/27/2020   ALKPHOS 59 08/27/2020   BILITOT 0.4 08/27/2020     Microbiology: No results found for this or any previous visit (from the past 240 hour(s)).   Serology:   Imaging: If present, new imagings (plain films, ct scans, and mri) have been personally visualized and interpreted; radiology reports have been reviewed. Decision making incorporated into the Impression / Recommendations.  3/07 abd pelv ct 1. Skin thickening with mild subcutaneous edema involving the lower anterior abdominal wall, suspicious for cellulitis. Recommend correlation with physical exam. 2. Prominent borderline and mildly enlarged lymph nodes in the external iliac and inguinal stations, more so on the right, likely reactive or related to HIV. Lymph node size has decreased from 2020 CT. 3. Bilateral nonobstructing renal calculi  3/07 cxr No active disease (normal cardiac silhouette; no pulm opacity)  Jabier Mutton, [place] [facility] [place] [phone] pager    08/27/2020, 10:43 AM

## 2020-08-27 NOTE — Hospital Course (Addendum)
       Data BUNs/creatinine 7/0.7, magnesium 1.5 Iron 40 TIBC 260 saturation ratio is 15 Platelet 91 down from baseline 1 6170 WBC down to 3.1 Hemoglobin 10 in outpatient-->9.7  CT Chest 1. Borderline mediastinal, hilar and axillary lymph nodes likely related to patient's HIV disease. 2. No acute pulmonary findings or pulmonary lesions.

## 2020-08-28 ENCOUNTER — Inpatient Hospital Stay (HOSPITAL_COMMUNITY): Payer: Medicaid Other

## 2020-08-28 DIAGNOSIS — R519 Headache, unspecified: Secondary | ICD-10-CM

## 2020-08-28 DIAGNOSIS — R591 Generalized enlarged lymph nodes: Secondary | ICD-10-CM

## 2020-08-28 LAB — DIC (DISSEMINATED INTRAVASCULAR COAGULATION)PANEL
D-Dimer, Quant: 1.75 ug/mL-FEU — ABNORMAL HIGH (ref 0.00–0.50)
Fibrinogen: 209 mg/dL — ABNORMAL LOW (ref 210–475)
INR: 1.1 (ref 0.8–1.2)
Platelets: 78 10*3/uL — ABNORMAL LOW (ref 150–400)
Prothrombin Time: 13.8 seconds (ref 11.4–15.2)
aPTT: 31 seconds (ref 24–36)

## 2020-08-28 LAB — COMPREHENSIVE METABOLIC PANEL
ALT: 14 U/L (ref 0–44)
AST: 32 U/L (ref 15–41)
Albumin: 2 g/dL — ABNORMAL LOW (ref 3.5–5.0)
Alkaline Phosphatase: 54 U/L (ref 38–126)
Anion gap: 6 (ref 5–15)
BUN: 7 mg/dL (ref 6–20)
CO2: 19 mmol/L — ABNORMAL LOW (ref 22–32)
Calcium: 7.3 mg/dL — ABNORMAL LOW (ref 8.9–10.3)
Chloride: 112 mmol/L — ABNORMAL HIGH (ref 98–111)
Creatinine, Ser: 0.66 mg/dL (ref 0.44–1.00)
GFR, Estimated: >60 mL/min (ref >60)
Glucose, Bld: 91 mg/dL (ref 70–99)
Potassium: 3.8 mmol/L (ref 3.5–5.1)
Sodium: 137 mmol/L (ref 135–145)
Total Bilirubin: 0.1 mg/dL — ABNORMAL LOW (ref 0.3–1.2)
Total Protein: 6.6 g/dL (ref 6.5–8.1)

## 2020-08-28 LAB — CBC WITH DIFFERENTIAL/PLATELET
Abs Immature Granulocytes: 0.01 10*3/uL (ref 0.00–0.07)
Basophils Absolute: 0 10*3/uL (ref 0.0–0.1)
Basophils Relative: 0 %
Eosinophils Absolute: 1.6 10*3/uL — ABNORMAL HIGH (ref 0.0–0.5)
Eosinophils Relative: 48 %
HCT: 27.2 % — ABNORMAL LOW (ref 36.0–46.0)
Hemoglobin: 8.3 g/dL — ABNORMAL LOW (ref 12.0–15.0)
Immature Granulocytes: 0 %
Lymphocytes Relative: 15 %
Lymphs Abs: 0.5 10*3/uL — ABNORMAL LOW (ref 0.7–4.0)
MCH: 25.7 pg — ABNORMAL LOW (ref 26.0–34.0)
MCHC: 30.5 g/dL (ref 30.0–36.0)
MCV: 84.2 fL (ref 80.0–100.0)
Monocytes Absolute: 0.2 10*3/uL (ref 0.1–1.0)
Monocytes Relative: 6 %
Neutro Abs: 1.1 10*3/uL — ABNORMAL LOW (ref 1.7–7.7)
Neutrophils Relative %: 31 %
Platelets: 78 10*3/uL — ABNORMAL LOW (ref 150–400)
RBC: 3.23 MIL/uL — ABNORMAL LOW (ref 3.87–5.11)
RDW: 17.2 % — ABNORMAL HIGH (ref 11.5–15.5)
WBC: 3.4 10*3/uL — ABNORMAL LOW (ref 4.0–10.5)
nRBC: 0 % (ref 0.0–0.2)

## 2020-08-28 LAB — T-HELPER CELLS (CD4) COUNT (NOT AT ARMC)
CD4 % Helper T Cell: 1 % — ABNORMAL LOW (ref 33–65)
CD4 T Cell Abs: <35 /uL — ABNORMAL LOW (ref 400–1790)

## 2020-08-28 LAB — HEPATITIS B SURFACE ANTIGEN: Hepatitis B Surface Ag: NONREACTIVE

## 2020-08-28 LAB — MISC LABCORP TEST (SEND OUT): Labcorp test code: 82370

## 2020-08-28 LAB — MAGNESIUM: Magnesium: 1.6 mg/dL — ABNORMAL LOW (ref 1.7–2.4)

## 2020-08-28 LAB — HEPATITIS B SURFACE ANTIBODY, QUANTITATIVE: Hep B S AB Quant (Post): <3.1 m[IU]/mL — ABNORMAL LOW (ref >9.9)

## 2020-08-28 LAB — T.PALLIDUM AB, TOTAL: T Pallidum Abs: NONREACTIVE

## 2020-08-28 MED ORDER — SODIUM CHLORIDE 0.9 % IV SOLN: 1.0000 g | INTRAVENOUS | Status: DC

## 2020-08-28 MED ORDER — GADOBUTROL 1 MMOL/ML IV SOLN
5.0000 mL | Freq: Once | INTRAVENOUS | Status: AC | PRN
  Administered 2020-08-28: 5 mL via INTRAVENOUS

## 2020-08-28 MED ORDER — MAGNESIUM SULFATE 4 GM/100ML IV SOLN
4.0000 g | Freq: Once | INTRAVENOUS | Status: AC
  Administered 2020-08-28: 4 g via INTRAVENOUS
  Filled 2020-08-28: qty 100

## 2020-08-28 NOTE — Progress Notes (Addendum)
[facility] for [facility]ious Disease  Date of Admission:  08/26/2020      Lines:  Peripheral IV  Abx: 3/08-c valacyclovir  3/07-8 bactrim ds 1 tab daily 3/07 once ceftriaxone                                                    Assessment: 40 yo female with aids (previously on biktarvy/bactrim prophy but not currently on any medication for 3-4 months pta) admitted for failure to thrive/weight loss, chronic intermittent diarrhea, fever, and skin rash  hiv             vl    /    cd4 (%) 12/26/19  60k   /    <35 03/2018   95     /      20 05/2016   49     /      51 12/2015  338k  /      10   3/07 blood cx negative  3/07 abd/pelv ct mentioned renal calculi (nonobstructed) and inguinal/iliac LAD (improved from 2020 3/07 cxr clear  No sepsis here so far.   #b sx #skin rash #lad #diarrhea #headache Multiple AIDS associated ID complication/malignancy possible in the current context of her symptoms (rash, LAD, weight loss, fever). Kaposi/bacilliary angiomatosis, mac, disseminated fungal infection such as crypto, syphilis all possible. -The rash seems to be 2 processes -- the inguinal/suprapubic ulceration will r/o hsv. The papular rash could be syphilis, kaposi/bacilliary angiomatosis/fungal/cutaneous leukemia. Likely will start ART soon if no tb/cryptococcal meningitis are present. If the papular rash don't improve (and rpr negative), could consider getting biopsy at that time -Will w/u diarrhea with gi pcr (cryptosporidiosis, giardia, mac/cancer/fungal/cmv). If negative and sx worse, will probably need endoscopy input (suspect outpatient process). -LAD tb/fungal/mac/leukemia/kaposi vs hiv associated LAD. LDH mildly elevated 300s. Chest ct no parenchymal disease but mentioned thoracic lymphadenopathy. If cell lines continue to decrease or b sx, consider w/u for lymphoma as well in setting aids/ lymphadenopathy. Will see how she responds to ART. No fever at this  time or lft/bone marrow abnormality to pursue disseminated mac. Pending fungal serology -headache: negative crypto serum Ag. Pending mri for further w/u   #thrombocytopenia ddx wide. hiv associated itp, disseminated oi such as mac/fungal, lymphoma Has generalized lad and elevated ldh 330s. Await endemic fungi serology and afb blood cx Will start art soon and see how she responds If worsening all cell line might need bone marrow biopsy  #cervical SCC/CIN Patient hadn't f/u with outpatient pelvis mri or gynecology evaluation Will arrange pelvis mri here and refer her again for outpatient gyn unless mri is concerning   Plan:   1. Send hiv pcr/cd4, integrase resistance testing, genotype 2. F/u Endemic fungi serology, quantiferon gold, blood afb cx, rpr, tppa 3. Await gi panel pcr to be sent 4. F/u for hsv pcr left groin ulcer, vaginal swab for gc/chlam/trich 5. F/u hepatitis b&c 6. Planning on starting ART in the next few days once brain mri obtained 7. Pelvis mri with contrast for staging of her cervical CIN/scc 8. continue valacyclovir 500 mg po bid  I spent more than 30 minute reviewing data/chart, and coordinating care and >50% direct face to face time providing counseling/discussing diagnostics/treatment plan with patient  Active Problems:  Urinary tract infection with hematuria   Genital ulcer, female   Weakness   Skin rash associated with AIDS (HCC)   Cellulitis of abdominal wall   Scheduled Meds: . feeding supplement  237 mL Oral BID BM  . valACYclovir  500 mg Oral BID   Continuous Infusions: . magnesium sulfate bolus IVPB    . banana bag IV 1000 mL 125 mL/hr at 08/28/20 0836   PRN Meds:.acetaminophen **OR** acetaminophen, ibuprofen, ondansetron, oxyCODONE-acetaminophen, triamcinolone ointment   SUBJECTIVE: No new sx Chest ct reviewed Pending pelvis and brain mri No fever/chill  Review of Systems: ROS Other ros negative  Allergies  Allergen  Reactions  . Bee Venom Anaphylaxis, Shortness Of Breath and Swelling  . Morphine And Related Hives and Itching    Just can't take "morphine", vicodin is OK    OBJECTIVE: Vitals:   08/27/20 2017 08/28/20 0010 08/28/20 0424 08/28/20 1230  BP: 114/82 114/71 128/80 114/88  Pulse: 80 83 82 72  Resp: _0 Temp: 98 F (36.7 C) 98.4 F (36.9 C) 98.2 F (36.8 C) 97.7 F (36.5 C)  TempSrc: Oral Oral Oral Oral  SpO2: 100% 98% 99% 100%  Weight:      Height:       Body mass index is 21.96 kg/m.  Physical Exam No distress, conversant Heent: normocephalic; conj clear; per Neck supple cv rrr no mrg Lungs clear abd s/nt Ext no edema Skin stable papular/scabbed hyperpigmented rash on ext/trunk/face. Gu: ulceration groin/suprapubis with mild surrounding swelling; tender Neuro nonfocal Psych alert/oriented msk no joint swelling/tenderness  Lab Results Lab Results  Component Value Date   WBC 3.4 (L) 08/28/2020   HGB 8.3 (L) 08/28/2020   HCT 27.2 (L) 08/28/2020   MCV 84.2 08/28/2020   PLT 78 (L) 08/28/2020    Lab Results  Component Value Date   CREATININE 0.66 08/28/2020   BUN 7 08/28/2020   NA 137 08/28/2020   K 3.8 08/28/2020   CL 112 (H) 08/28/2020   CO2 19 (L) 08/28/2020    Lab Results  Component Value Date   ALT 14 08/28/2020   AST 32 08/28/2020   ALKPHOS 54 08/28/2020   BILITOT 0.1 (L) 08/28/2020     Microbiology: Recent Results (from the past 240 hour(s))  AFB culture, blood     Status: None (Preliminary result)   Collection Time: 08/26/20 10:13 AM   Specimen: Blood  Result Value Ref Range Status   MICRO NUMBER: [id number]  Preliminary   SPECIMEN QUALITY: Adequate  Preliminary   SOURCE: BLOOD  Preliminary   STATUS: PRELIMINARY  Preliminary   CULTURE:   Preliminary    Culture results to follow. Final reports of negative cultures can be expected in approximately six weeks. Positive cultures are reported immediately.  Culture, blood (routine x 2)      Status: None (Preliminary result)   Collection Time: 08/26/20  6:49 PM   Specimen: BLOOD  Result Value Ref Range Status   Specimen Description BLOOD SITE NOT SPECIFIED  Final   Special Requests   Final    BOTTLES DRAWN AEROBIC AND ANAEROBIC Blood Culture results may not be optimal due to an inadequate volume of blood received in culture bottles   Culture   Final    NO GROWTH 2 DAYS Performed at [facility], [place] [address], [place], [place] [address]    Report Status PENDING  Incomplete  Culture, blood (routine x 2)     Status: None (Preliminary result)  Collection Time: 08/26/20  6:50 PM   Specimen: BLOOD  Result Value Ref Range Status   Specimen Description BLOOD SITE NOT SPECIFIED  Final   Special Requests   Final    BOTTLES DRAWN AEROBIC AND ANAEROBIC Blood Culture results may not be optimal due to an inadequate volume of blood received in culture bottles   Culture   Final    NO GROWTH 2 DAYS Performed at [facility], [place] [address] [place], [place] [address]    Report Status PENDING  Incomplete    Serology:  Imaging: If present, new imagings (plain films, ct scans, and mri) have been personally visualized and interpreted; radiology reports have been reviewed. Decision making incorporated into the Impression / Recommendations.  3/08 chest ct 1. Borderline mediastinal, hilar and axillary lymph nodes likely related to patient's HIV disease. 2. No acute pulmonary findings or pulmonary lesions.  3/07 abd pelv ct 1. Skin thickening with mild subcutaneous edema involving the lower anterior abdominal wall, suspicious for cellulitis. Recommend correlation with physical exam. 2. Prominent borderline and mildly enlarged lymph nodes in the external iliac and inguinal stations, more so on the right, likely reactive or related to HIV. Lymph node size has decreased from 2020 CT. 3. Bilateral nonobstructing renal calculi  3/07 cxr No active disease (normal cardiac  silhouette; no pulm opacity)  Jabier Mutton, [place] for Infectious [place] [phone] pager    08/28/2020, 5:26 PM

## 2020-08-28 NOTE — TOC Initial Note (Addendum)
Transition of Care [facility]) - Initial/Assessment Note    Patient Details  Name: Beverly [name] MRN: [id number] Date of Birth: February 26, 1981  Transition of Care [facility]) CM/SW Contact:    Marilu Favre, RN Phone Number: 08/28/2020, 11:04 AM  Clinical Narrative:                  Spoke to patient at bedside.   Confirmed her phone number [phone].  Address in Epic is her sister's and father's address , she gets her mail there.   Prior to admission patient was staying with a friend at [employer]. That address has bed bugs and roaches. At discharge patient may go back and stay 1 to 2 days while she finds another place. Also her boss has told her that he will help her get a hotel room. NCM asked if she can stay with her sister and father, patient states they do not have room.   NCM provided [employer] for food, shelter etc.   Patient does not see a PCP. She does have Medicaid , Medicaid assigns a PCP. Patient unsure who she has been assigned to. Insurance card has not been scanned into system , will ask her to check card.    Scheduled follow up appointment with [facility] for October 02, 2020 at 1030 am placed on AVS   Patient states she usually gets her meds including biktarvy at [facility]. NCM discussed TOC pharmacy , patient would like to get medications through [facility] . NCM discussed with [facility] they will be able to fill prescriptions. Pharmacy changed to [facility]    Expected Discharge Plan: Home/Self Care Barriers to Discharge: Continued Medical Work up   Patient Goals and CMS Choice   CMS Medicare.gov Compare Post Acute Care list provided to:: Patient    Expected Discharge Plan and Services Expected Discharge Plan: Home/Self Care       Living arrangements for the past 2 months: Single Family Home                   DME Agency: NA       HH Arranged: NA          Prior Living Arrangements/Services Living arrangements for  the past 2 months: Single Family Home Lives with:: Roommate Patient language and need for interpreter reviewed:: Yes Do you feel safe going back to the place where you live?: Yes      Need for Family Participation in Patient Care: Yes (Comment) Care giver support system in place?: Yes (comment)      Activities of Daily Living Home Assistive Devices/Equipment: None ADL Screening (condition at time of admission) [name] cognitive ability adequate to safely complete daily activities?: Yes Is the patient deaf or have difficulty hearing?: No Does the patient have difficulty seeing, even when wearing glasses/contacts?: No Does the patient have difficulty concentrating, remembering, or making decisions?: No Patient able to express need for assistance with ADLs?: No Does the patient have difficulty dressing or bathing?: No Independently performs ADLs?: Yes (appropriate for developmental age) Communication: Independent Dressing (OT): Independent Grooming: Independent Feeding: Independent Bathing: Independent Toileting: Independent In/Out Bed: Independent Walks in Home: Independent Does the patient have difficulty walking or climbing stairs?: Yes Weakness of Legs: Both Weakness of Arms/Hands: None  Permission Sought/Granted   Permission granted to share information with : No              Emotional Assessment Appearance:: Appears stated age  Attitude/Demeanor/Rapport: Engaged Affect (typically observed): Accepting Orientation: : Oriented to Self,Oriented to Place,Oriented to  Time,Oriented to Situation Alcohol / Substance Use: Not Applicable Psych Involvement: No (comment)  Admission diagnosis:  Cellulitis of abdominal wall [L03.311] HIV disease (HCC) [B20] Weakness [R53.1] Weight loss [R63.4] Urinary tract infection with hematuria, site unspecified [N39.0, R31.9] AIDS (acquired immunodeficiency syndrome), CD4 <=200/<=14% ([place]) [B20] Patient Active Problem List   Diagnosis  Date Noted  . Skin rash associated with AIDS ([place]) 08/27/2020  . Cellulitis of abdominal wall   . Weakness 08/26/2020  . HIV disease ([place])   . Weight loss   . Nausea & vomiting 05/25/2018  . Genital ulcer, female 03/10/2018  . Itching 03/10/2018  . 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 Pancytopenia ([place]) 01/09/2016  . Non-healing skin lesion 05/06/2015  . Homeless 03/19/2015  . 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 Depression 10/17/2013  . Anemia 10/17/2013  . Thrombocytopenia ([place]) 10/17/2013  . Cigarette smoker 10/17/2013  . Asthma 10/17/2013  . Urinary tract infection with hematuria 10/16/2013  . Encounter for general adult medical examination without abnormal findings 06/12/2011  . 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 Anxiety and depression 07/17/2008  . AIDS (acquired immune deficiency syndrome) ([place]) 06/28/2008   PCP:  [employer] Call[employer], NP Pharmacy:   [facility] [place], [place] - [address] AT [address] [place] [address] Phone: [phone] Fax: [phone]  [facility] [place], [place] - [address] [place] [address] Phone: [phone] Fax: [phone]     Social Determinants of Health (SDOH) Interventions    Readmission Risk Interventions No flowsheet data found.
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
## 2020-08-29 ENCOUNTER — Other Ambulatory Visit: Payer: Self-pay | Admitting: Family Medicine

## 2020-08-29 DIAGNOSIS — A6000 Herpesviral infection of urogenital system, unspecified: Secondary | ICD-10-CM

## 2020-08-29 LAB — GASTROINTESTINAL PANEL BY PCR, STOOL (REPLACES STOOL CULTURE)

## 2020-08-29 LAB — CBC WITH DIFFERENTIAL/PLATELET
Abs Immature Granulocytes: 0.01 10*3/uL (ref 0.00–0.07)
Basophils Absolute: 0 10*3/uL (ref 0.0–0.1)
Basophils Relative: 1 %
Eosinophils Absolute: 1.7 10*3/uL — ABNORMAL HIGH (ref 0.0–0.5)
Eosinophils Relative: 50 %
HCT: 27.1 % — ABNORMAL LOW (ref 36.0–46.0)
Hemoglobin: 8.4 g/dL — ABNORMAL LOW (ref 12.0–15.0)
Immature Granulocytes: 0 %
Lymphocytes Relative: 16 %
Lymphs Abs: 0.5 10*3/uL — ABNORMAL LOW (ref 0.7–4.0)
MCH: 26.1 pg (ref 26.0–34.0)
MCHC: 31 g/dL (ref 30.0–36.0)
MCV: 84.2 fL (ref 80.0–100.0)
Monocytes Absolute: 0.2 10*3/uL (ref 0.1–1.0)
Monocytes Relative: 6 %
Neutro Abs: 0.9 10*3/uL — ABNORMAL LOW (ref 1.7–7.7)
Neutrophils Relative %: 27 %
Platelets: 84 10*3/uL — ABNORMAL LOW (ref 150–400)
RBC: 3.22 MIL/uL — ABNORMAL LOW (ref 3.87–5.11)
RDW: 17.2 % — ABNORMAL HIGH (ref 11.5–15.5)
WBC: 3.4 10*3/uL — ABNORMAL LOW (ref 4.0–10.5)
nRBC: 0.6 % — ABNORMAL HIGH (ref 0.0–0.2)

## 2020-08-29 LAB — COMPREHENSIVE METABOLIC PANEL
ALT: 14 U/L (ref 0–44)
AST: 31 U/L (ref 15–41)
Albumin: 2.1 g/dL — ABNORMAL LOW (ref 3.5–5.0)
Alkaline Phosphatase: 66 U/L (ref 38–126)
Anion gap: 4 — ABNORMAL LOW (ref 5–15)
BUN: 6 mg/dL (ref 6–20)
CO2: 22 mmol/L (ref 22–32)
Calcium: 7.5 mg/dL — ABNORMAL LOW (ref 8.9–10.3)
Chloride: 110 mmol/L (ref 98–111)
Creatinine, Ser: 0.79 mg/dL (ref 0.44–1.00)
GFR, Estimated: >60 mL/min (ref >60)
Glucose, Bld: 95 mg/dL (ref 70–99)
Potassium: 3.8 mmol/L (ref 3.5–5.1)
Sodium: 136 mmol/L (ref 135–145)
Total Bilirubin: 0.7 mg/dL (ref 0.3–1.2)
Total Protein: 6.4 g/dL — ABNORMAL LOW (ref 6.5–8.1)

## 2020-08-29 LAB — T.PALLIDUM AB, TOTAL: T Pallidum Abs: NONREACTIVE

## 2020-08-29 LAB — HSV DNA BY PCR (REFERENCE LAB)
HSV 1 DNA: NEGATIVE
HSV 2 DNA: POSITIVE — AB

## 2020-08-29 LAB — MAGNESIUM: Magnesium: 2.5 mg/dL — ABNORMAL HIGH (ref 1.7–2.4)

## 2020-08-29 MED ORDER — SULFAMETHOXAZOLE-TRIMETHOPRIM 800-160 MG PO TABS: 1.0000 | ORAL_TABLET | ORAL | 0 refills | Status: DC

## 2020-08-29 MED ORDER — PENICILLIN G BENZATHINE 1200000 UNIT/2ML IM SUSP
2.4000 10*6.[IU] | Freq: Once | INTRAMUSCULAR | Status: AC
  Administered 2020-08-29: 2.4 10*6.[IU] via INTRAMUSCULAR
  Filled 2020-08-29: qty 4

## 2020-08-29 MED ORDER — BICTEGRAVIR-EMTRICITAB-TENOFOV 50-200-25 MG PO TABS
1.0000 | ORAL_TABLET | Freq: Every day | ORAL | Status: DC
  Administered 2020-08-29 – 2020-08-30 (×2): 1 via ORAL
  Filled 2020-08-29 (×2): qty 1

## 2020-08-29 MED ORDER — SULFAMETHOXAZOLE-TRIMETHOPRIM 800-160 MG PO TABS
1.0000 | ORAL_TABLET | ORAL | Status: DC
  Administered 2020-08-30: 1 via ORAL
  Filled 2020-08-29: qty 1

## 2020-08-29 MED ORDER — VALACYCLOVIR HCL 500 MG PO TABS: 500.0000 mg | ORAL_TABLET | Freq: Two times a day (BID) | ORAL | 0 refills | Status: DC
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
## 2020-08-29 NOTE — Progress Notes (Signed)
Patient to be discharged on 3/11 due to still needing to be seen by [facility] RN as well as patient to have discharge Meds by [place].

## 2020-08-29 NOTE — Progress Notes (Addendum)
[facility]  Date of Admission:  08/26/2020      Lines:  Peripheral IV  Abx: 3/08-c valacyclovir  3/07-8 bactrim ds 1 tab daily 3/07 once ceftriaxone                                                    Assessment: 40 yo female with aids (previously on biktarvy/bactrim prophy but not currently on any medication for 3-4 months pta) admitted for failure to thrive/weight loss, chronic intermittent diarrhea, fever, and skin rash   #AIDS hiv             vl    /    cd4 (%) 03/08        P     /    <35 12/26/19  60k   /    <35 03/2018   95     /      20 05/2016   49     /      51 12/2015  338k  /      10   3/07 blood cx negative  3/07 abd/pelv ct mentioned renal calculi (nonobstructed) and inguinal/iliac LAD (improved from 2020 3/07 cxr clear  No sepsis here so far.   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 This would be unusual for secondary syphilis as tppa would be expected to be positive by now.  Would repeat syphilis testing both rpr/tppa in another 2-4 weeks Would consider erring on giving her 1 shot pcn though  #lad Chronic pelvis LAD Chest ct also saw thoracic LAD With b sx ddx hiv associated (highest suspicion) vs mac vs endemic fungi vs tb vs lymphoma vs kaposi or castleman Afebrile thus far Crypt Ag negative Histo/blasto/cocci, quant gold in progress LDH mildly elevated nonspecific but could suggest lymphoma Anticipate ART reinitiation soon and see how she'll respond (if fever/worsening lymphadenopathy/cytopenias/lft elevation potentially will need to biopsy these lymph nodes Will need repeat ct body in a few weeks  #diarrhea Gi panel pcr sent Multiple etiology possible cryptosporidium, giardia, mac/fungal process,  lymphoma Plan endoscopy in future if no improvement on ART and negative gi pcr  #headache Mri normal Negative crypto ag Reports she had frontal headache with anxiety episodes   #thrombocytopenia ddx wide. hiv associated itp, disseminated oi such as mac/fungal, lymphoma Has generalized lad and elevated ldh 330s. Await endemic fungi serology and afb blood cx Will start art soon and see how she responds If worsening all cell line might need bone marrow biopsy  #cervical SCC/CIN Patient hadn't f/u with outpatient pelvis mri or gynecology evaluation Will arrange pelvis mri here and refer her again for outpatient gyn unless mri is concerning  #std screen Vaginal swab gc/chlam negative Hep b & c serology negative   Plan:   1. F/u hiv pcr, integrase resistance testing, genotype 2. F/u Endemic fungi serology, quantiferon gold, blood afb cx 3. F/u gi pcr 4. Discussed with nursing staff to send urine histo ag 5. Start ART --> biktarvy 6. Start bactrim ds tiw pjp prophylaxis 7. Will defer MAC prophy for now until r/o active infection (pending afb blood cx) 8.  F/u pelvis mri with contrast for staging of her cervical CIN/scc 9. continue valacyclovir 500 mg po bid for 14 days until 3/22 10. Once all testing sent, patient can discharge with ID f/u and gynecologic f/u  I spent more than 30 minute reviewing data/chart, and coordinating care and >50% direct face to face time providing counseling/discussing diagnostics/treatment plan with patient  Active Problems:   Urinary tract infection with hematuria   Genital ulcer, female   Weakness   Skin rash associated with AIDS (F[place])   Cellulitis of abdominal wall   Nonintractable headache   Generalized lymphadenopathy   Scheduled Meds: . feeding supplement  237 mL Oral BID BM  . valACYclovir  500 mg Oral BID   Continuous Infusions: . banana bag IV 1000 mL 125 mL/hr at 08/29/20 0637   PRN Meds:.acetaminophen **OR** acetaminophen,  ibuprofen, ondansetron, oxyCODONE-acetaminophen, triamcinolone ointment   SUBJECTIVE: No new sx No fever/chill Diarrhea improving Eating better Still very painful but slightly Improved pain groin/suprapubis since valtrex started  Review of Systems: ROS Other ros negative  Allergies  Allergen Reactions  . Bee Venom Anaphylaxis, Shortness Of Breath and Swelling  . Morphine And Related Hives and Itching    Just can't take "morphine", vicodin is OK    OBJECTIVE: Vitals:   08/28/20 1230 08/28/20 1814 08/28/20 2328 08/29/20 0502  BP: 114/88 (!) 107/93 128/83 116/75  Pulse: 72 78 78 93  Resp: _0 Temp: 97.7 F (36.5 C) 98.9 F (37.2 C) 97.9 F (36.6 C) 97.6 F (36.4 C)  TempSrc: Oral Oral Oral Oral  SpO2: 100% 100% 100% 99%  Weight:      Height:       Body mass index is 21.96 kg/m.  Physical Exam No distress, conversant Heent: normocephalic; conj clear; per Neck supple cv rrr no mrg Lungs clear abd s/nt Ext no edema Skin stable papular/scabbed hyperpigmented rash on ext/trunk/face. Gu: ulceration groin/suprapubis with mild surrounding swelling; tender Neuro nonfocal Psych alert/oriented msk no joint swelling/tenderness  Lab Results Lab Results  Component Value Date   WBC 3.4 (L) 08/29/2020   HGB 8.4 (L) 08/29/2020   HCT 27.1 (L) 08/29/2020   MCV 84.2 08/29/2020   PLT 84 (L) 08/29/2020    Lab Results  Component Value Date   CREATININE 0.79 08/29/2020   BUN 6 08/29/2020   NA 136 08/29/2020   K 3.8 08/29/2020   CL 110 08/29/2020   CO2 22 08/29/2020    Lab Results  Component Value Date   ALT 14 08/29/2020   AST 31 08/29/2020   ALKPHOS 66 08/29/2020   BILITOT 0.7 08/29/2020     Microbiology: Recent Results (from the past 240 hour(s))  AFB culture, blood     Status: None (Preliminary result)   Collection Time: 08/26/20 10:13 AM   Specimen: Blood  Result Value Ref Range Status   MICRO NUMBER: [id number]  Preliminary   SPECIMEN QUALITY:  Adequate  Preliminary   SOURCE: BLOOD  Preliminary   STATUS: PRELIMINARY  Preliminary   CULTURE:   Preliminary    Culture results to follow. Final reports of negative cultures can be expected in approximately six weeks. Positive cultures are reported immediately.  Culture, blood (routine x 2)     Status: None (Preliminary result)   Collection Time: 08/26/20  6:49 PM   Specimen: BLOOD  Result Value Ref Range Status   Specimen Description BLOOD SITE NOT SPECIFIED  Final   Special Requests   Final  BOTTLES DRAWN AEROBIC AND ANAEROBIC Blood Culture results may not be optimal due to an inadequate volume of blood received in culture bottles   Culture   Final    NO GROWTH 2 DAYS Performed at [facility], [place] [address], [place], [place] [address]    Report Status PENDING  Incomplete  Culture, blood (routine x 2)     Status: None (Preliminary result)   Collection Time: 08/26/20  6:50 PM   Specimen: BLOOD  Result Value Ref Range Status   Specimen Description BLOOD SITE NOT SPECIFIED  Final   Special Requests   Final    BOTTLES DRAWN AEROBIC AND ANAEROBIC Blood Culture results may not be optimal due to an inadequate volume of blood received in culture bottles   Culture   Final    NO GROWTH 2 DAYS Performed at [facility], [place] [address], [place], [place] [address]    Report Status PENDING  Incomplete    Serology:  Imaging: If present, new imagings (plain films, ct scans, and mri) have been personally visualized and interpreted; radiology reports have been reviewed. Decision making incorporated into the Impression / Recommendations.  3/09 mri brain  1. No acute intracranial abnormality. Unremarkable MRI of the brain. 2. Diffuse decrease of the T1 signal within the visualized upper cervical spine and clivus may be related to red marrow reconversion in the setting of anemia.  3/08 chest ct 1. Borderline mediastinal, hilar and axillary lymph nodes likely related to  patient's HIV disease. 2. No acute pulmonary findings or pulmonary lesions.  3/07 abd pelv ct 1. Skin thickening with mild subcutaneous edema involving the lower anterior abdominal wall, suspicious for cellulitis. Recommend correlation with physical exam. 2. Prominent borderline and mildly enlarged lymph nodes in the external iliac and inguinal stations, more so on the right, likely reactive or related to HIV. Lymph node size has decreased from 2020 CT. 3. Bilateral nonobstructing renal calculi  3/07 cxr No active disease (normal cardiac silhouette; no pulm opacity)  Jabier Mutton, [facility] [place] [phone] pager    08/29/2020, 10:18 AM

## 2020-08-30 ENCOUNTER — Other Ambulatory Visit: Payer: Self-pay | Admitting: Internal Medicine

## 2020-08-30 LAB — CBC WITH DIFFERENTIAL/PLATELET
Abs Immature Granulocytes: 0.01 10*3/uL (ref 0.00–0.07)
Basophils Absolute: 0 10*3/uL (ref 0.0–0.1)
Basophils Relative: 0 %
Eosinophils Absolute: 1.6 10*3/uL — ABNORMAL HIGH (ref 0.0–0.5)
Eosinophils Relative: 52 %
HCT: 27.2 % — ABNORMAL LOW (ref 36.0–46.0)
Hemoglobin: 8.3 g/dL — ABNORMAL LOW (ref 12.0–15.0)
Immature Granulocytes: 0 %
Lymphocytes Relative: 15 %
Lymphs Abs: 0.5 10*3/uL — ABNORMAL LOW (ref 0.7–4.0)
MCH: 25.7 pg — ABNORMAL LOW (ref 26.0–34.0)
MCHC: 30.5 g/dL (ref 30.0–36.0)
MCV: 84.2 fL (ref 80.0–100.0)
Monocytes Absolute: 0.2 10*3/uL (ref 0.1–1.0)
Monocytes Relative: 6 %
Neutro Abs: 0.9 10*3/uL — ABNORMAL LOW (ref 1.7–7.7)
Neutrophils Relative %: 27 %
Platelets: 87 10*3/uL — ABNORMAL LOW (ref 150–400)
RBC: 3.23 MIL/uL — ABNORMAL LOW (ref 3.87–5.11)
RDW: 17.3 % — ABNORMAL HIGH (ref 11.5–15.5)
WBC: 3.1 10*3/uL — ABNORMAL LOW (ref 4.0–10.5)
nRBC: 0 % (ref 0.0–0.2)

## 2020-08-30 LAB — BLASTOMYCES ANTIGEN: Blastomyces Antigen: NOT DETECTED ng/mL

## 2020-08-30 LAB — PATHOLOGIST SMEAR REVIEW

## 2020-08-30 LAB — RPR
RPR Ser Ql: REACTIVE — AB
RPR Titer: 1:1 {titer}

## 2020-08-30 LAB — COMPREHENSIVE METABOLIC PANEL
ALT: 12 U/L (ref 0–44)
AST: 29 U/L (ref 15–41)
Albumin: 2.1 g/dL — ABNORMAL LOW (ref 3.5–5.0)
Alkaline Phosphatase: 57 U/L (ref 38–126)
Anion gap: 3 — ABNORMAL LOW (ref 5–15)
BUN: 6 mg/dL (ref 6–20)
CO2: 21 mmol/L — ABNORMAL LOW (ref 22–32)
Calcium: 7.9 mg/dL — ABNORMAL LOW (ref 8.9–10.3)
Chloride: 112 mmol/L — ABNORMAL HIGH (ref 98–111)
Creatinine, Ser: 0.65 mg/dL (ref 0.44–1.00)
GFR, Estimated: >60 mL/min (ref >60)
Glucose, Bld: 82 mg/dL (ref 70–99)
Potassium: 3.9 mmol/L (ref 3.5–5.1)
Sodium: 136 mmol/L (ref 135–145)
Total Bilirubin: 0.8 mg/dL (ref 0.3–1.2)
Total Protein: 6.4 g/dL — ABNORMAL LOW (ref 6.5–8.1)

## 2020-08-30 LAB — HISTOPLASMA ANTIGEN, URINE: Histoplasma Antigen, urine: <0.5 (ref <0.5)

## 2020-08-30 MED ORDER — BICTEGRAVIR-EMTRICITAB-TENOFOV 50-200-25 MG PO TABS: 1.0000 | ORAL_TABLET | Freq: Every day | ORAL | 0 refills | Status: DC

## 2020-08-30 MED FILL — BIKTARVY 50-200-25 MG TABS: 50-200-25 | 30 days supply | Qty: 30 | Fill #0

## 2020-08-30 MED FILL — SULFAMETHOXAZOLE-TMP DS TAB: 800-160 | 28 days supply | Qty: 12 | Fill #0

## 2020-08-30 MED FILL — VALACYCLOVIR HCL 500 MG TAB: 500 | 12 days supply | Qty: 24 | Fill #0
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
## 2020-08-30 NOTE — Consult Note (Signed)
WOC Nurse Consult Note: Patient receiving care in [place]. Reason for Consult: "Long term wounds on lower ABD and upper leg" Wound type: Per Dr. Cephus Richer H&P note from 08/26/20 at 6:33 p.m. 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 Patient should follow up with dermatology or infectious disease specialists for guidance on wound care after discharge.  Please provide patient with gauze to cover abdominal wounds. Pressure Injury POA: Yes/No/NA Measurement: Wound bed: Drainage (amount, consistency, odor)  Periwound: Dressing procedure/placement/frequency: Val Riles, RN, MSN, CWOCN, CNS-BC, pager [phone]

## 2020-08-30 NOTE — Progress Notes (Signed)
Patient discharged yesterday awaiting transport She is homeless but living with friends, working and has a car  Gi pcr negative; diarrhea improving No change from 3/10 plan   Appointment for her with [facility] Janene Madeira on  3/17 @ 1030 am  Please have rx for bactrim ds prophy, valacyclovir enough until 3/22, and biktarvy Gynecologic referral previously placed by [facility], patient to f/u outpatient gyn for Cervical CIN

## 2020-08-31 LAB — QUANTIFERON-TB GOLD PLUS: QuantiFERON-TB Gold Plus: UNDETERMINED — AB

## 2020-08-31 LAB — CULTURE, BLOOD (ROUTINE X 2)
Culture: NO GROWTH
Culture: NO GROWTH

## 2020-08-31 LAB — QUANTIFERON-TB GOLD PLUS (RQFGPL)
QuantiFERON Mitogen Value: 0.39 IU/mL
QuantiFERON Nil Value: 0.07 IU/mL
QuantiFERON TB1 Ag Value: 0.1 IU/mL
QuantiFERON TB2 Ag Value: 0.08 IU/mL

## 2020-09-02 ENCOUNTER — Telehealth: Payer: Self-pay

## 2020-09-02 DIAGNOSIS — B20 Human immunodeficiency virus [HIV] disease: Secondary | ICD-10-CM

## 2020-09-02 NOTE — Telephone Encounter (Signed)
Transition Care Management Follow-up Telephone Call  Date of discharge and from where: 08/30/2020 from [facility]  How have you been since you were released from the hospital? Pt stated that she is feeling okay. Pt stated that she is having a lot of pain in her legs with weakness and numbness.   Any questions or concerns? No  Items Reviewed:  Did the pt receive and understand the discharge instructions provided? Yes   Medications obtained and verified? Yes   Other? No   Any new allergies since your discharge? No   Dietary orders reviewed? n/a  Do you have support at home? Yes   Functional Questionnaire: (I = Independent and D = Dependent) ADLs: I  Bathing/Dressing- I  Meal Prep- I  Eating- I  Maintaining continence- I  Transferring/Ambulation- D, pt is able but it is difficult due to pain.   Managing Meds- I   Follow up appointments reviewed:   PCP Hospital f/u appt confirmed? Yes  Scheduled to see Freeman Caldron, [place] on 10/02/2020 @ 10:30am.  [facility] f/u appt confirmed? Yes  Scheduled to see 09/05/2020 on Janene Madeira, NP @ 10:30am.  Are transportation arrangements needed? No   If their condition worsens, is the pt aware to call PCP or go to the Emergency Dept.? Yes  Was the patient provided with contact information for the PCP's office or ED? Yes  Was to pt encouraged to call back with questions or concerns? Yes

## 2020-09-05 ENCOUNTER — Telehealth: Payer: Self-pay

## 2020-09-05 ENCOUNTER — Ambulatory Visit: Payer: Medicaid Other | Admitting: Infectious Diseases

## 2020-09-05 ENCOUNTER — Inpatient Hospital Stay: Payer: Medicaid Other | Admitting: Internal Medicine

## 2020-09-05 NOTE — Telephone Encounter (Signed)
Called patient at 4:05 to confirm she will come to her appointment. Patient states she has attempted multiple times to reach front desk without success. Is not able to come in today and needs to reschedule for next available.  Appointment has been rescheduled for 3/22 at 2:30 with Dr. [place] Bali.

## 2020-09-10 ENCOUNTER — Encounter: Payer: Self-pay | Admitting: Infectious Diseases

## 2020-09-10 ENCOUNTER — Other Ambulatory Visit: Payer: Self-pay

## 2020-09-10 ENCOUNTER — Ambulatory Visit (INDEPENDENT_AMBULATORY_CARE_PROVIDER_SITE_OTHER): Payer: Medicaid Other | Admitting: Infectious Diseases

## 2020-09-10 VITALS — BP 120/87 | HR 92 | Temp 97.5°F | Wt 124.0 lb

## 2020-09-10 DIAGNOSIS — B20 Human immunodeficiency virus [HIV] disease: Secondary | ICD-10-CM | POA: Diagnosis present

## 2020-09-10 DIAGNOSIS — D069 Carcinoma in situ of cervix, unspecified: Secondary | ICD-10-CM

## 2020-09-10 DIAGNOSIS — Z23 Encounter for immunization: Secondary | ICD-10-CM

## 2020-09-10 MED ORDER — BICTEGRAVIR-EMTRICITAB-TENOFOV 50-200-25 MG PO TABS: 1.0000 | ORAL_TABLET | Freq: Every day | ORAL | 3 refills | Status: DC

## 2020-09-10 MED ORDER — SULFAMETHOXAZOLE-TRIMETHOPRIM 800-160 MG PO TABS: 1.0000 | ORAL_TABLET | ORAL | 3 refills | Status: DC

## 2020-09-10 MED ORDER — VALACYCLOVIR HCL 500 MG PO TABS: 500.0000 mg | ORAL_TABLET | Freq: Two times a day (BID) | ORAL | 1 refills | Status: AC

## 2020-09-10 MED ORDER — DIPHENHYDRAMINE HCL 25 MG PO CAPS: 25.0000 mg | ORAL_CAPSULE | Freq: Four times a day (QID) | ORAL | 0 refills | Status: DC | PRN

## 2020-09-10 NOTE — Progress Notes (Addendum)
[facility], [place], [place], [address]                                                                  Phn. [phone]; Fax: [phone]                                                                             Date: 09/10/20  Reason for Visit: HIV follow up Primary Care provider:   HPI: Beverly [name] is a 40 y.o.old female with a history of HIV/AIDs (VL 331000 in 08/26/20 and, CD4 <35, 1% in 08/27/20), ? H/o Kaposi sarcoma,  homeless who is here for hospital  follow up. Patient is being followed up by NP Janene Madeira for her HIV care. She seems to hav enever been persistently undetectable in terms of HIV VL since 2005 2/2 non compliance and psychosocial issues. She was recently seen in the hospital for failure to thrive/weight loss, painful rash in her groin/lower abdomen, diarrhea and abdominal pain. She was off ART for 3-42months prior to her recent hospital admission.   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 Borderline mediastinal, axillary and hilar lymphadenopathy related to HIV disease in CT chest. Unremarkable MRI brain.  Received one dose of Benzathine penicillin for positive RPR with a titre of 1:2 . Her T pallidum abs * 2 were negative.   Of note, her pap smear in 03/10/2018 was consistent with squamous cell ca with HPV 18 and 45 detected. However, she has not been able to follow up with Gyn Onc so far.   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  She missed her fu appt with  Dr Gale Journey and NP Janene Madeira earlier. Says has been taking Biktarvy daily, bactrim three times a week and valacyclovir as instructed. She says the ulcer in the lower abdomen/ left groin has not completely healed but she thinks it looks better than when she presented to the hospital. She has on and off drainage from the ulcer in the lower abdomen. It is mostly painful. Endorses having poor appetite. Denies nausea.vomiting. Diarrhea is resolved.   Smokes 4-5 cigarettes a day, alcohol occasionally, Took ectasy in the past. Denies current use of illicit drugs.  last sexually active 4-6 months ago Started working  at [employer] recently   Feels sad and depressed at  time. Does not feel hurting herself or others. She is interested to see Mental health counselor.  ROS: Denies dysphagia, odynophagia, cough, fever, nausea, vomiting, diarrhea, constipation, weight loss, chills, night sweats, recent hospitalizations, rashes, joint complaints, shortness of breath, headaches, chest pain, abdominal pain, dysuria .  Current Outpatient Medications on File Prior to Visit  Medication Sig Dispense Refill  . acetaminophen (TYLENOL) 500 MG tablet Take 500 mg by mouth every 6 (six) hours as needed for moderate pain.    . valACYclovir (VALTREX) 500 MG tablet Take 1 tablet (500 mg total) by mouth 2 (two) times daily for 12 days. 24 tablet 0   No current facility-administered medications on file prior to visit.    Allergies  Allergen Reactions  . Bee Venom Anaphylaxis, Shortness Of Breath and Swelling  . Morphine And Related Hives and Itching    Just can't take "morphine", vicodin is OK   Past Medical History:  Diagnosis Date  . Abscess   . AIDS ([place]) 03/19/2015  . Anxiety   . Asthma   . Blood transfusion without reported diagnosis   . Depression   . HIV infection ([place])   . Homeless 03/19/2015   states stays with family or friends. Does not rent or own a home, but does not live on streets.  . 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 Leg lesion 03/19/2015  . Major depression, recurrent ([place]) 03/19/2015  . 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 Skin ulcer ([employer]) 05/06/2015    Vitals   BP 120/87   Pulse 92   Temp (!) 97.5 F (36.4 C) (Oral)   Wt 124 lb (56.2 kg)   LMP 08/12/2020   BMI 23.43 kg/m    Examination  Gen: Alert and oriented x 3, no acute distress, LOOKS ANXIOUS  HEENT: [place]/AT, PERL,no scleral icterus, no pale conjunctivae, hearing normal, oral mucosa moist Neck: Supple, no lymphadenopathy Cardio: Regular rate and rhythm; +S1 and S2; no murmurs, gallops, or rubs Resp: CTAB; no wheezes, rhonchi, or rales GI: Soft, nontender, nondistended, bowel sounds present GU:       Musc: Extremities: No cyanosis, clubbing, or edema; +2 PT and DP pulses Skin: CHRONIC LOOKING SKIN LESIONS - NO DRAINAGE, MACERATION/REDNESS OR SIGNS OF ACTIVE INFECTIONS , MULTIPLE EXCORIATIONS+ ( ? IN THE SETTING OF BED BUG BITE) Neuro: No focal deficits Psych: Calm, cooperative    Lab Results HIV 1 RNA Quant (copies/mL)  Date Value  08/26/2020 331,000 (H)  12/26/2019 60,200 (H)  04/11/2018 95 (H)   CD4 T Cell Abs (/uL)  Date Value  08/27/2020 <35 (L)  12/26/2019 <35 (L)  04/06/2018 20 (L)   No results found for: HIV1GENOSEQ Lab Results  Component Value Date   WBC 3.1 (L) 08/30/2020   HGB 8.3 (L) 08/30/2020   HCT 27.2 (L) 08/30/2020   MCV 84.2 08/30/2020   PLT 87 (L) 08/30/2020    Lab Results  Component Value Date   CREATININE 0.65 08/30/2020   BUN 6 08/30/2020   NA 136 08/30/2020   K 3.9 08/30/2020   CL 112 (H) 08/30/2020   CO2 21 (L) 08/30/2020   Lab Results  Component Value Date   ALT 12 08/30/2020   AST 29 08/30/2020   ALKPHOS 57 08/30/2020   BILITOT 0.8 08/30/2020    Lab Results  Component Value Date   CHOL 146 06/10/2016   TRIG 56 06/10/2016   HDL 72 06/10/2016   LDLCALC 63 06/10/2016   No  results found for: HAV Lab Results  Component Value Date    HEPBSAG NON REACTIVE 08/27/2020   HEPBSAB NEG 06/28/2008   Lab Results  Component Value Date   HCVAB NON REACTIVE 08/27/2020   Lab Results  Component Value Date   CHLAMYDIAWP Negative 08/27/2020   N Negative 08/27/2020   No results found for: GCPROBEAPT No results found for: QUANTGOLD  Imagings CT abdomen/pelvis 08/27/20 FINDINGS: Cardiovascular: The heart is normal in size. No pericardial effusion. The aorta is normal in caliber. No atherosclerotic calcifications. No coronary artery calcifications.  Mediastinum/Nodes: Scattered borderline mediastinal and hilar lymph nodes likely related to patient's HIV disease. There are also borderline enlarged axillary and subpectoral nodes.  The esophagus is grossly normal.  Lungs/Pleura: No acute pulmonary findings. No infiltrates, edema or effusions. No pulmonary lesions.  Upper Abdomen: No significant upper abdominal findings. Scattered mesenteric and retroperitoneal lymph nodes. No splenomegaly.  Musculoskeletal: No breast masses.  No significant bony findings.  IMPRESSION: 1. Borderline mediastinal, hilar and axillary lymph nodes likely related to patient's HIV disease. 2. No acute pulmonary findings or pulmonary lesions.  MRI Pelvis 08/27/20  FINDINGS: Urinary Tract: No sign of distal ureteral distension. Urinary bladder with smooth contours. Urethral diverticulum is present, larger than in 2017 measuring approximately 8 mm greatest thickness, horseshoe shaped and favoring the RIGHT side of the urethra. Some mild periurethral enhancement is noted on post-contrast imaging. This fills with contrast on delayed imaging. No periurethral stranding.  Bowel: Visualized gastrointestinal tract is unremarkable with very limited assessment on study not performed for bowel evaluation.  Vascular/Lymphatic: Pelvic lymphadenopathy as on the recent CT of the abdomen and pelvis. Vascular structures in the pelvis  are grossly patent.  11 mm LEFT pelvic sidewall lymph node on image 15 of series 6, 12 mm LEFT external iliac lymph node also serving as an example of pelvic lymphadenopathy. LEFT retroperitoneal lymph node on image 5 of series 6 adjacent to the psoas muscle.  Reproductive: Signs of prior C-section. Small field-of-view T2 images were not acquired in this patient which allowed for high-resolution imaging of the cervix. There is no visible cervical mass or secondary signs of extra cervical extension but the study is limited by field of view constraints.  Bilateral ovaries with unremarkable appearance.  Other: Extensive suprapubic subcutaneous fat edema and some skin thickening. This tracks into the mons pubis and into the labia bilaterally  Musculoskeletal: No acute musculoskeletal process. Subcutaneous fat stranding in the lower abdominal wall and suprapubic region as described with 6 associated skin thickening.  IMPRESSION: 1. No gross cervical mass. Study limited by lack of small field-of-view imaging. Suggest gyn consultation for further evaluation to determine whether further staging with MRI may be needed based on pathology results. Would also help to guide most appropriate follow-up. 2. Lymphadenopathy in the pelvis in this patient with HIV is nonspecific. Lymphoproliferative disorder and metastatic disease could both have this appearance. Superimposed reactive changes in the setting of potential cellulitis of the anterior abdominal wall is also considered. 3. Presumed cellulitis over the anterior abdominal wall tracking into the suprapubic region and mons. Correlate with direct clinical inspection. 4. 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 Correlate with urine studies to determine whether there is UTI. No gross lesion is demonstrated in this area. Follow-up urologic consultation may be of benefit given the size of this diverticulum which could lead  to recurrent UTI and pelvic symptoms. 5. Post C-section in the past.  These results will be called to the ordering  clinician or representative by the Radiologist Assistant, and communication documented in the PACS or [place] Maintenance: Immunization History  Administered Date(s) Administered  . Influenza Whole 06/28/2008, 02/19/2011  . Influenza,inj,Quad PF,6+ Mos 03/05/2015, 06/24/2016, 03/10/2018  . Meningococcal Mcv4o 06/24/2016, 03/10/2018  . PPD Test 03/15/2015  . Pneumococcal Polysaccharide-23 06/28/2008, 05/06/2015    Assessment/Plan: HIV/AIDS Continue Biktarvy, Bactrim for PJP ppx, meds refilled Extensively counseled on adherence of ART Fu in 4-6 weeks with NP Janene Madeira Will also refer her to [facility] for Mental health counseling       Cervical squamous cell carcinoma Urgently referred to [facility]. 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 Will possibly need to continue until complete resolution of ulcers  Will need to consider for biopsy to r/o alternate causes if does not heal in expected time frame for genital HSV infection   Chronic Lower Extremity skin lesions  This seems to be chronic and has been there for a long time. It does not appear acutely infected/macerated or infected. She also complains of ongoing issues with bed bug bites and continues to scratch the area due to itching. Discussed with her regarding disinfecting her clothes, linen etc  Will prescribe benadryl for symptomatic management  Smoking Counseled  STD Screening  Recently screened in the hospital   Immunization Willing to get PSV 13 vaccine   [name] labs were reviewed as well as his previous records. Patients questions were addressed and answered. Safe sex counseling  done.  I spent 30 minute reviewing data/chart, and coordinating care and >50% direct face to face time providing counseling/discussing diagnostics/treatment plan with patient  Electronically signed by:  Rosiland Oz, MD Infectious Disease Physician [facility] [address] [place], [place] [address] Phone: [phone]  Fax: [phone]

## 2020-09-16 ENCOUNTER — Telehealth: Payer: Self-pay

## 2020-09-16 LAB — HIV GENOSURE(R) MG

## 2020-09-16 LAB — GENOSURE INTEGRASE HIV EDI: HIV Genosure Integrase PDF 2: 1

## 2020-09-16 LAB — HIV-1 RNA, PCR (GRAPH) RFX/GENO EDI
HIV-1 RNA BY PCR: 390000 copies/mL
HIV-1 RNA Quant, Log: 5.591 log10copy/mL

## 2020-09-16 LAB — HIV GENOSURE REFLEX - HIVGTY - ELECTRONIC RECORD

## 2020-09-16 LAB — REFLEX TO GENOSURE(R) MG EDI: HIV GenoSure(R): 1

## 2020-09-16 NOTE — Telephone Encounter (Signed)
Told Beverly [name] that the GYN Oncology surgeons reviewed her chart and stated that she can be seen by a regular gyn physician.  Gave her the phone number to [place]: [phone].  Pt verbalized understanding.

## 2020-09-27 ENCOUNTER — Encounter: Payer: Self-pay | Admitting: Family Medicine

## 2020-09-27 ENCOUNTER — Other Ambulatory Visit (HOSPITAL_COMMUNITY)
Admission: RE | Admit: 2020-09-27 | Discharge: 2020-09-27 | Disposition: A | Discharge status: Home / Self Care | Payer: Medicaid Other | Source: Ambulatory Visit | Attending: Family Medicine | Admitting: Family Medicine

## 2020-09-27 ENCOUNTER — Ambulatory Visit (INDEPENDENT_AMBULATORY_CARE_PROVIDER_SITE_OTHER): Payer: Medicaid Other | Admitting: Family Medicine

## 2020-09-27 ENCOUNTER — Other Ambulatory Visit: Payer: Self-pay

## 2020-09-27 VITALS — BP 119/73 | HR 112 | Ht 61.0 in | Wt 130.1 lb

## 2020-09-27 DIAGNOSIS — N900 Mild vulvar dysplasia: Secondary | ICD-10-CM | POA: Diagnosis not present

## 2020-09-27 DIAGNOSIS — D069 Carcinoma in situ of cervix, unspecified: Secondary | ICD-10-CM

## 2020-09-27 NOTE — Progress Notes (Signed)
GYNECOLOGY OFFICE VISIT NOTE  History:   Beverly [name] is a 40 y.o. G1P0 here today for follow up of abnormal colposcopy.  PMHx notable for HIV/AIDS, recently admitted to [facility] from 3/7-3/11 with weight loss, falls, and c/f opportunistic disseminated infection due to immunosuppresion. Workup largely unremarkable for infections. CD4 count found to be <35, also had pelvic MRI which showed lymphadenopathy of unclear etiology. No cervical mass seen.   Notably patient also followed by Gyn Onc in late 2019. 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 Subsequently had a cone biopsy w Gyn Onc on 04/14/2018 with the following pathology:   REPORT OF SURGICAL PATHOLOGY FINAL DIAGNOSIS Diagnosis 1. Cervix, cone, 12 o'clock - HIGH GRADE SQUAMOUS INTRAEPITHELIAL LESION (CIN-II-III), WITH EXTENSIVE INVOLVEMENT OF ENDOCERVICAL GLANDS; SEE COMMENT. 2. Endocervix, curettage - HIGH GRADE SQUAMOUS INTRAEPITHELIAL LESION (CIN-II-III), WITH INVOLVEMENT OF ENDOCERVICAL GLANDS. 3. Vagina, biopsy, 9 o'clock - LOW GRADE SQUAMOUS INTRAEPITHELIAL LESION (VAIN-I). 4. 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 - NO DYSPLASIA IDENTIFIED. 5. Vulva, biopsy, 4 o'clock - LOW GRADE SQUAMOUS INTRAEPITHELIAL LESION (VIN-I). Microscopic Comment 1. The endocervical margin is involved by high grade squamous dysplasia along the 3-6, 6-9 and 9-12 o'clock positions. The ectocervical margin is not involved.   Today reports period has been late since end of February but regular previously Not sexually active with female partners for a long time  Past Medical History:  Diagnosis Date  . Abscess   . AIDS ([place]) 03/19/2015  . Anxiety   . Asthma   . Blood transfusion without reported diagnosis   . Depression   . HIV infection ([place])   . Homeless 03/19/2015   states stays with family or  friends. Does not rent or own a home, but does not live on streets.  . 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 Leg lesion 03/19/2015  . Major depression, recurrent ([place]) 03/19/2015  . 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 Skin ulcer ([place]) 05/06/2015    Past Surgical History:  Procedure Laterality Date  . CERVICAL CONIZATION W/BX N/A 04/14/2018   Procedure: COLD KNIFE CONIZATION CERVIX WITH BIOPSY;  Surgeon: Isabel Caprice, MD;  Location: [facility];  Service: Gynecology;  Laterality: N/A;  . CESAREAN SECTION    . DILATION AND CURETTAGE OF UTERUS N/A 04/14/2018   Procedure: ENDOCERVICAL CURETTAGE;  Surgeon: Isabel Caprice, MD;  Location: [facility];  Service: Gynecology;  Laterality: N/A;  . INCISION AND DRAINAGE ABSCESS Right 01/10/2016   Procedure: INCISION AND DRAINAGE RIGHT BUTTOCK, LEFT LABIAL , EXCISION AND DRAINAGE OF  RIGHT AXILLARY ABSCESS;  Surgeon: Excell Seltzer, MD;  Location: [facility];  Service: General;  Laterality: Right;  . TUBAL LIGATION  2005  . VULVA Milagros Loll BIOPSY N/A 04/14/2018   Procedure: VULVAR BIOPSIES;  Surgeon: Isabel Caprice, MD;  [facility];  Service: Gynecology;  Laterality: N/A;    The following portions of the patient's history were reviewed and updated as appropriate: allergies, current medications, past family history, past medical history, past social history, past surgical history and problem list.    Review of Systems:  Pertinent items noted in HPI and remainder of comprehensive ROS otherwise negative.  Physical Exam:  BP 119/73   Pulse (!) 112   Ht 5\' 1"  (1.549 m)   Wt 130 lb 1.6  oz (59 kg)   LMP 08/19/2020 (Exact Date)   BMI 24.58 kg/m  CONSTITUTIONAL: Well-developed, well-nourished female in no acute distress.  HEENT:  Normocephalic, atraumatic. External right and left ear normal. No scleral icterus.  NECK: Normal range of motion, supple, no masses noted on  observation SKIN: No rash noted. Not diaphoretic. No erythema. No pallor. MUSCULOSKELETAL: Normal range of motion. No edema noted. NEUROLOGIC: Alert and oriented to person, place, and time. Normal muscle tone coordination.  PSYCHIATRIC: Normal mood and affect. Normal behavior. Normal judgment and thought content. RESPIRATORY: Effort normal, no problems with respiration noted ABDOMEN: No masses noted. No other overt distention noted.   PELVIC: See pictures of perineum below, L sided lesion biopsied with 53mm punch at its inferior border to include both normal and abnormal tissue. Cervix visually normal without lesions and deviated to patient's left. Bimanual exam with firm cervix without nodularity, patient very anxious and unable to complete full bimanual due to patient discomfort         Labs and Imaging No results found for this or any previous visit (from the past 168 hour(s)). MR BRAIN W WO CONTRAST  Result Date: 08/28/2020 CLINICAL DATA:  Meningitis/CNS infection suspected.  Aids, headache. EXAM: MRI HEAD WITHOUT AND WITH CONTRAST TECHNIQUE: Multiplanar, multiecho pulse sequences of the brain and surrounding structures were obtained without and with intravenous contrast. CONTRAST:  53mL GADAVIST GADOBUTROL 1 MMOL/ML IV SOLN COMPARISON:  Head CT December 09, 2015 FINDINGS: Brain: No acute infarction, hemorrhage, hydrocephalus, extra-axial collection or mass lesion. The brain parenchyma has normal morphology and signal characteristics. No focus of abnormal contrast enhancement. Vascular: Normal flow voids. Skull and upper cervical spine: Diffuse decrease of the T1 signal within the visualized upper cervical spine and clivus may be related to red marrow reconversion in the setting of anemia. Sinuses/Orbits: Negative. IMPRESSION: 1. No acute intracranial abnormality. Unremarkable MRI of the brain. 2. Diffuse decrease of the T1 signal within the visualized upper cervical spine and clivus may be related  to red marrow reconversion in the setting of anemia. Electronically Signed   By: Pedro Earls M.D.   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 L inguinal/vulvar lesion biopsied at inferior border to include normal and abnormal tissue. May be severe chronic candidiasis but uncertain without biopsy. Procedure was extremely difficult due to patient anxiety around needles and pain. I did not think she would tolerate biopsy of more central vulvar lesions in the office and would need to be done in OR in the future with at minimum sedation. Follow up pending biopsy results.       Relevant Orders   Surgical pathology( [place]/ [facility])     Genitourinary   CIN III with severe dysplasia    Cervix visually normal in appearance and without lesions, pap collected + cotesting.       Relevant Orders   Cytology - PAP( [place])      Routine preventative health maintenance measures emphasized. Please refer to After Visit Summary for other counseling recommendations.   Return for pending biopsy and pap results.    Total face-to-face time with patient: 30 minutes.  Over 50% of encounter was spent on counseling and coordination of care.   Clarnce Flock, MD/[facility]  for [employer], [place]

## 2020-09-27 NOTE — Assessment & Plan Note (Signed)
Multiple unusual vulvar and inguinal lesions. L inguinal/vulvar lesion biopsied at inferior border to include normal and abnormal tissue. May be severe chronic candidiasis but uncertain without biopsy. Procedure was extremely difficult due to patient anxiety around needles and pain. I did not think she would tolerate biopsy of more central vulvar lesions in the office and would need to be done in OR in the future with at minimum sedation. Follow up pending biopsy results.

## 2020-09-27 NOTE — Progress Notes (Signed)
VULVAR BIOPSY NOTE The indications for vulvar biopsy (rule out neoplasia, establish lichen sclerosus diagnosis) were reviewed.   Risks of the biopsy including pain, bleeding, infection, inadequate specimen, scarring and need for additional procedures  were discussed. The patient stated understanding and agreed to undergo procedure today. Consent was signed,  time out performed.   The patient's vulva was prepped with Betadine. 1% lidocaine was injected into the left vulvar/inguinal area. A 6-mm punch biopsy was done, biopsy tissue was picked up with sterile forceps and sterile scissors were used to excise the lesion.  Small bleeding was noted and hemostasis was achieved using silver nitrate sticks.  The patient tolerated the procedure well. Post-procedure instructions were given to the patient. The patient is to call with heavy bleeding, fever greater than 100.4, foul smelling vaginal discharge or other concerns.

## 2020-09-27 NOTE — Assessment & Plan Note (Signed)
Cervix visually normal in appearance and without lesions, pap collected + cotesting.

## 2020-09-27 NOTE — Patient Instructions (Signed)
  Skin Biopsy, Care After This sheet gives you information about how to care for yourself after your procedure. Your health care provider may also give you more specific instructions. If you have problems or questions, contact your health care provider. What can I expect after the procedure? After the procedure, it is common to have:  Soreness.  Bruising.  Itching. Follow these instructions at home: Biopsy site care Follow instructions from your health care provider about how to take care of your biopsy site. Make sure you:  Wash your hands with soap and water before and after you change your bandage (dressing). If soap and water are not available, use hand sanitizer.  Apply ointment on your biopsy site as directed by your health care provider.  Change your dressing as told by your health care provider.  Leave stitches (sutures), skin glue, or adhesive strips in place. These skin closures may need to stay in place for 2 weeks or longer. If adhesive strip edges start to loosen and curl up, you may trim the loose edges. Do not remove adhesive strips completely unless your health care provider tells you to do that.  If the biopsy area bleeds, apply gentle pressure for 10 minutes. Check your biopsy site every day for signs of infection. Check for:  Redness, swelling, or pain.  Fluid or blood.  Warmth.  Pus or a bad smell.   General instructions  Rest and then return to your normal activities as told by your health care provider.  Take over-the-counter and prescription medicines only as told by your health care provider.  Keep all follow-up visits as told by your health care provider. This is important. Contact a health care provider if:  You have redness, swelling, or pain around your biopsy site.  You have fluid or blood coming from your biopsy site.  Your biopsy site feels warm to the touch.  You have pus or a bad smell coming from your biopsy site.  You have a  fever.  Your sutures, skin glue, or adhesive strips loosen or come off sooner than expected. Get help right away if:  You have bleeding that does not stop with pressure or a dressing. Summary  After the procedure, it is common to have soreness, bruising, and itching at the site.  Follow instructions from your health care provider about how to take care of your biopsy site.  Check your biopsy site every day for signs of infection.  Contact a health care provider if you have redness, swelling, or pain around your biopsy site, or your biopsy site feels warm to the touch.  Keep all follow-up visits as told by your health care provider. This is important. This information is not intended to replace advice given to you by your health care provider. Make sure you discuss any questions you have with your health care provider. Document Revised: 12/06/2017 Document Reviewed: 12/06/2017 Elsevier Patient Education  2021 [employer]  

## 2020-10-02 ENCOUNTER — Ambulatory Visit: Payer: Medicaid Other | Attending: Physician Assistant | Admitting: Physician Assistant

## 2020-10-02 ENCOUNTER — Other Ambulatory Visit: Payer: Self-pay

## 2020-10-02 ENCOUNTER — Encounter: Payer: Self-pay | Admitting: Physician Assistant

## 2020-10-02 DIAGNOSIS — D649 Anemia, unspecified: Secondary | ICD-10-CM | POA: Diagnosis not present

## 2020-10-02 DIAGNOSIS — D069 Carcinoma in situ of cervix, unspecified: Secondary | ICD-10-CM

## 2020-10-02 DIAGNOSIS — G63 Polyneuropathy in diseases classified elsewhere: Secondary | ICD-10-CM

## 2020-10-02 DIAGNOSIS — B20 Human immunodeficiency virus [HIV] disease: Secondary | ICD-10-CM

## 2020-10-02 DIAGNOSIS — Z09 Encounter for follow-up examination after completed treatment for conditions other than malignant neoplasm: Secondary | ICD-10-CM | POA: Diagnosis not present

## 2020-10-02 MED ORDER — GABAPENTIN 300 MG PO CAPS: 300.0000 mg | ORAL_CAPSULE | Freq: Two times a day (BID) | ORAL | 3 refills | Status: DC
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
## 2020-10-03 ENCOUNTER — Telehealth: Payer: Self-pay | Admitting: Family Medicine

## 2020-10-03 LAB — SURGICAL PATHOLOGY

## 2020-10-03 NOTE — Telephone Encounter (Signed)
Called patient and identified by two markers.   Discussed results of pap with LSIL cytology and vulvar/inguinal biopsy showing no evidence of malignancy. Given difficulty of the exam and biopsy I discussed with patient that we should do future procedures in the OR so that we can have adequate anesthesia, she is in agreement with this plan. Discussed that she still needs to have colposcopy and vulvar biopsies of other areas that I deemed too difficult/painful to do in the office (clitoral hood, R labia majora) as well as colposcopy and likely biopsy of the cervix.   Case discussed with Dr. Sabra Heck who is happy to see patient in the office and schedule her for these procedures in the OR.  [facility] staff please call patient and schedule a pre-op appointment with Dr. Sabra Heck, she is aware you will be contacting her.

## 2020-10-07 NOTE — Telephone Encounter (Signed)
Called patient and left message to call please call  the office back to scheduled appointment with [place].

## 2020-10-09 LAB — CYTOLOGY - PAP
Adequacy: ABSENT
Comment: NEGATIVE
Comment: NEGATIVE
HPV 16: NEGATIVE
HPV 18 / 45: NEGATIVE
High risk HPV: POSITIVE — AB

## 2020-10-09 LAB — AFB CULTURE, BLOOD
MICRO NUMBER:: 11618846
SPECIMEN QUALITY:: ADEQUATE

## 2020-10-09 LAB — HIV-1 INTEGRASE GENOTYPE

## 2020-10-09 LAB — TOXOPLASMA GONDII ANTIBODY, IGG: Toxoplasma IgG Ratio: <7.2 IU/mL

## 2020-10-09 LAB — HIV RNA, RTPCR W/R GT (RTI, PI,INT)
HIV 1 RNA Quant: 331000 copies/mL — ABNORMAL HIGH
HIV-1 RNA Quant, Log: 5.52 Log copies/mL — ABNORMAL HIGH

## 2020-10-09 LAB — HIV-1 GENOTYPE: HIV-1 Genotype: DETECTED — AB

## 2020-10-15 ENCOUNTER — Other Ambulatory Visit: Payer: Medicaid Other

## 2020-10-16 ENCOUNTER — Other Ambulatory Visit: Payer: Medicaid Other

## 2020-10-29 ENCOUNTER — Telehealth: Payer: Self-pay

## 2020-10-29 ENCOUNTER — Encounter: Payer: Medicaid Other | Admitting: Infectious Diseases

## 2020-10-29 NOTE — Telephone Encounter (Signed)
Missed appt on 10/29/2020 with Dr Montel Culver but needs to reschedule with Janene Madeira per Dr Lonzo Candy. Left VMon secure line to please call and schedule with Janene Madeira.

## 2020-11-04 ENCOUNTER — Telehealth: Payer: Self-pay

## 2020-11-04 NOTE — Telephone Encounter (Signed)
Called patient to get an appointment scheduled with Janene Madeira, left a voicemail for patient to call us back and get it scheduled

## 2020-12-19 ENCOUNTER — Ambulatory Visit: Payer: Medicaid Other | Admitting: Family Medicine

## 2021-01-23 ENCOUNTER — Inpatient Hospital Stay (HOSPITAL_BASED_OUTPATIENT_CLINIC_OR_DEPARTMENT_OTHER)
Admission: EM | Admit: 2021-01-23 | Discharge: 2021-01-27 | DRG: 392 | Disposition: A | Discharge status: Home / Self Care | Payer: Medicaid Other | Attending: Student | Admitting: Student

## 2021-01-23 ENCOUNTER — Other Ambulatory Visit: Payer: Self-pay

## 2021-01-23 ENCOUNTER — Emergency Department (HOSPITAL_BASED_OUTPATIENT_CLINIC_OR_DEPARTMENT_OTHER): Payer: Medicaid Other

## 2021-01-23 ENCOUNTER — Encounter (HOSPITAL_BASED_OUTPATIENT_CLINIC_OR_DEPARTMENT_OTHER): Payer: Self-pay | Admitting: *Deleted

## 2021-01-23 DIAGNOSIS — N879 Dysplasia of cervix uteri, unspecified: Secondary | ICD-10-CM | POA: Diagnosis present

## 2021-01-23 DIAGNOSIS — R112 Nausea with vomiting, unspecified: Secondary | ICD-10-CM | POA: Diagnosis present

## 2021-01-23 DIAGNOSIS — Z20822 Contact with and (suspected) exposure to covid-19: Secondary | ICD-10-CM | POA: Diagnosis present

## 2021-01-23 DIAGNOSIS — R109 Unspecified abdominal pain: Secondary | ICD-10-CM | POA: Diagnosis present

## 2021-01-23 DIAGNOSIS — E876 Hypokalemia: Secondary | ICD-10-CM

## 2021-01-23 DIAGNOSIS — Z8589 Personal history of malignant neoplasm of other organs and systems: Secondary | ICD-10-CM

## 2021-01-23 DIAGNOSIS — F1210 Cannabis abuse, uncomplicated: Secondary | ICD-10-CM | POA: Diagnosis not present

## 2021-01-23 DIAGNOSIS — R197 Diarrhea, unspecified: Secondary | ICD-10-CM

## 2021-01-23 DIAGNOSIS — Z8249 Family history of ischemic heart disease and other diseases of the circulatory system: Secondary | ICD-10-CM

## 2021-01-23 DIAGNOSIS — R531 Weakness: Secondary | ICD-10-CM

## 2021-01-23 DIAGNOSIS — E871 Hypo-osmolality and hyponatremia: Secondary | ICD-10-CM | POA: Diagnosis present

## 2021-01-23 DIAGNOSIS — R1084 Generalized abdominal pain: Secondary | ICD-10-CM | POA: Diagnosis not present

## 2021-01-23 DIAGNOSIS — D72810 Lymphocytopenia: Secondary | ICD-10-CM | POA: Diagnosis not present

## 2021-01-23 DIAGNOSIS — D509 Iron deficiency anemia, unspecified: Secondary | ICD-10-CM | POA: Diagnosis present

## 2021-01-23 DIAGNOSIS — Z9114 Patient's other noncompliance with medication regimen: Secondary | ICD-10-CM | POA: Diagnosis not present

## 2021-01-23 DIAGNOSIS — F17210 Nicotine dependence, cigarettes, uncomplicated: Secondary | ICD-10-CM | POA: Diagnosis present

## 2021-01-23 DIAGNOSIS — B9620 Unspecified Escherichia coli [E. coli] as the cause of diseases classified elsewhere: Secondary | ICD-10-CM | POA: Diagnosis present

## 2021-01-23 DIAGNOSIS — L988 Other specified disorders of the skin and subcutaneous tissue: Secondary | ICD-10-CM | POA: Diagnosis present

## 2021-01-23 DIAGNOSIS — R1110 Vomiting, unspecified: Secondary | ICD-10-CM

## 2021-01-23 DIAGNOSIS — R509 Fever, unspecified: Secondary | ICD-10-CM | POA: Diagnosis present

## 2021-01-23 DIAGNOSIS — Z5900 Homelessness unspecified: Secondary | ICD-10-CM | POA: Diagnosis not present

## 2021-01-23 DIAGNOSIS — F17200 Nicotine dependence, unspecified, uncomplicated: Secondary | ICD-10-CM | POA: Diagnosis not present

## 2021-01-23 DIAGNOSIS — D649 Anemia, unspecified: Secondary | ICD-10-CM | POA: Diagnosis not present

## 2021-01-23 DIAGNOSIS — Z808 Family history of malignant neoplasm of other organs or systems: Secondary | ICD-10-CM

## 2021-01-23 DIAGNOSIS — A0811 Acute gastroenteropathy due to Norwalk agent: Secondary | ICD-10-CM

## 2021-01-23 DIAGNOSIS — B20 Human immunodeficiency virus [HIV] disease: Secondary | ICD-10-CM | POA: Diagnosis present

## 2021-01-23 DIAGNOSIS — D500 Iron deficiency anemia secondary to blood loss (chronic): Secondary | ICD-10-CM | POA: Diagnosis not present

## 2021-01-23 DIAGNOSIS — M791 Myalgia, unspecified site: Secondary | ICD-10-CM | POA: Diagnosis not present

## 2021-01-23 DIAGNOSIS — R8271 Bacteriuria: Secondary | ICD-10-CM | POA: Diagnosis present

## 2021-01-23 LAB — URINALYSIS, MICROSCOPIC (REFLEX): WBC, UA: >50 WBC/hpf (ref 0–5)

## 2021-01-23 LAB — CBC WITH DIFFERENTIAL/PLATELET
Abs Immature Granulocytes: 0.08 10*3/uL — ABNORMAL HIGH (ref 0.00–0.07)
Basophils Absolute: 0 10*3/uL (ref 0.0–0.1)
Basophils Relative: 0 %
Eosinophils Absolute: 0.1 10*3/uL (ref 0.0–0.5)
Eosinophils Relative: 3 %
HCT: 31.1 % — ABNORMAL LOW (ref 36.0–46.0)
Hemoglobin: 9.8 g/dL — ABNORMAL LOW (ref 12.0–15.0)
Immature Granulocytes: 2 %
Lymphocytes Relative: 11 %
Lymphs Abs: 0.4 10*3/uL — ABNORMAL LOW (ref 0.7–4.0)
MCH: 24.4 pg — ABNORMAL LOW (ref 26.0–34.0)
MCHC: 31.5 g/dL (ref 30.0–36.0)
MCV: 77.4 fL — ABNORMAL LOW (ref 80.0–100.0)
Monocytes Absolute: 0.5 10*3/uL (ref 0.1–1.0)
Monocytes Relative: 14 %
Neutro Abs: 2.7 10*3/uL (ref 1.7–7.7)
Neutrophils Relative %: 70 %
Platelets: 273 10*3/uL (ref 150–400)
RBC: 4.02 MIL/uL (ref 3.87–5.11)
RDW: 16.4 % — ABNORMAL HIGH (ref 11.5–15.5)
WBC: 3.8 10*3/uL — ABNORMAL LOW (ref 4.0–10.5)
nRBC: 0 % (ref 0.0–0.2)

## 2021-01-23 LAB — COMPREHENSIVE METABOLIC PANEL
ALT: 13 U/L (ref 0–44)
AST: 21 U/L (ref 15–41)
Albumin: 2.9 g/dL — ABNORMAL LOW (ref 3.5–5.0)
Alkaline Phosphatase: 87 U/L (ref 38–126)
Anion gap: 9 (ref 5–15)
BUN: 18 mg/dL (ref 6–20)
CO2: 22 mmol/L (ref 22–32)
Calcium: 8.6 mg/dL — ABNORMAL LOW (ref 8.9–10.3)
Chloride: 101 mmol/L (ref 98–111)
Creatinine, Ser: 0.86 mg/dL (ref 0.44–1.00)
GFR, Estimated: >60 mL/min (ref >60)
Glucose, Bld: 89 mg/dL (ref 70–99)
Potassium: 2.8 mmol/L — ABNORMAL LOW (ref 3.5–5.1)
Sodium: 132 mmol/L — ABNORMAL LOW (ref 135–145)
Total Bilirubin: 0.4 mg/dL (ref 0.3–1.2)
Total Protein: 9.1 g/dL — ABNORMAL HIGH (ref 6.5–8.1)

## 2021-01-23 LAB — BASIC METABOLIC PANEL
Anion gap: 5 (ref 5–15)
BUN: 13 mg/dL (ref 6–20)
CO2: 21 mmol/L — ABNORMAL LOW (ref 22–32)
Calcium: 8 mg/dL — ABNORMAL LOW (ref 8.9–10.3)
Chloride: 105 mmol/L (ref 98–111)
Creatinine, Ser: 0.75 mg/dL (ref 0.44–1.00)
GFR, Estimated: >60 mL/min (ref >60)
Glucose, Bld: 131 mg/dL — ABNORMAL HIGH (ref 70–99)
Potassium: 3.3 mmol/L — ABNORMAL LOW (ref 3.5–5.1)
Sodium: 131 mmol/L — ABNORMAL LOW (ref 135–145)

## 2021-01-23 LAB — CBC
HCT: 27.4 % — ABNORMAL LOW (ref 36.0–46.0)
Hemoglobin: 8.3 g/dL — ABNORMAL LOW (ref 12.0–15.0)
MCH: 24.2 pg — ABNORMAL LOW (ref 26.0–34.0)
MCHC: 30.3 g/dL (ref 30.0–36.0)
MCV: 79.9 fL — ABNORMAL LOW (ref 80.0–100.0)
Platelets: 229 10*3/uL (ref 150–400)
RBC: 3.43 MIL/uL — ABNORMAL LOW (ref 3.87–5.11)
RDW: 16.6 % — ABNORMAL HIGH (ref 11.5–15.5)
WBC: 4.1 10*3/uL (ref 4.0–10.5)
nRBC: 0 % (ref 0.0–0.2)

## 2021-01-23 LAB — RESP PANEL BY RT-PCR (FLU A&B, COVID) ARPGX2
Influenza A by PCR: NEGATIVE
Influenza B by PCR: NEGATIVE
SARS Coronavirus 2 by RT PCR: NEGATIVE

## 2021-01-23 LAB — URINALYSIS, ROUTINE W REFLEX MICROSCOPIC
Bilirubin Urine: NEGATIVE
Glucose, UA: NEGATIVE mg/dL
Ketones, ur: NEGATIVE mg/dL
Nitrite: POSITIVE — AB
Protein, ur: 30 mg/dL — AB
Specific Gravity, Urine: 1.01 (ref 1.005–1.030)
pH: 6.5 (ref 5.0–8.0)

## 2021-01-23 LAB — HCG, SERUM, QUALITATIVE: Preg, Serum: NEGATIVE

## 2021-01-23 LAB — RAPID URINE DRUG SCREEN, HOSP PERFORMED
Amphetamines: NOT DETECTED
Barbiturates: NOT DETECTED
Benzodiazepines: NOT DETECTED
Cocaine: NOT DETECTED
Opiates: NOT DETECTED
Tetrahydrocannabinol: POSITIVE — AB

## 2021-01-23 LAB — MAGNESIUM
Magnesium: 1.5 mg/dL — ABNORMAL LOW (ref 1.7–2.4)
Magnesium: 1.7 mg/dL (ref 1.7–2.4)

## 2021-01-23 LAB — PHOSPHORUS: Phosphorus: 4.4 mg/dL (ref 2.5–4.6)

## 2021-01-23 LAB — PROTIME-INR
INR: 1.1 (ref 0.8–1.2)
Prothrombin Time: 14 seconds (ref 11.4–15.2)

## 2021-01-23 LAB — LACTIC ACID, PLASMA
Lactic Acid, Venous: 1.2 mmol/L (ref 0.5–1.9)
Lactic Acid, Venous: 1.9 mmol/L (ref 0.5–1.9)

## 2021-01-23 LAB — LIPASE, BLOOD: Lipase: 36 U/L (ref 11–51)

## 2021-01-23 MED ORDER — PANTOPRAZOLE SODIUM 40 MG IV SOLR
40.0000 mg | Freq: Once | INTRAVENOUS | Status: AC
  Administered 2021-01-23: 40 mg via INTRAVENOUS
  Filled 2021-01-23: qty 40

## 2021-01-23 MED ORDER — ONDANSETRON HCL 4 MG/2ML IJ SOLN
4.0000 mg | Freq: Once | INTRAMUSCULAR | Status: AC
  Administered 2021-01-23: 4 mg via INTRAVENOUS
  Filled 2021-01-23: qty 2

## 2021-01-23 MED ORDER — LACTATED RINGERS IV SOLN: INTRAVENOUS | Status: AC

## 2021-01-23 MED ORDER — ACETAMINOPHEN 650 MG RE SUPP: 650.0000 mg | Freq: Four times a day (QID) | RECTAL | Status: DC | PRN

## 2021-01-23 MED ORDER — MAGNESIUM SULFATE 50 % IJ SOLN
2.0000 g | Freq: Once | INTRAMUSCULAR | Status: DC
  Filled 2021-01-23: qty 4

## 2021-01-23 MED ORDER — ACETAMINOPHEN 325 MG PO TABS
650.0000 mg | ORAL_TABLET | Freq: Four times a day (QID) | ORAL | Status: DC | PRN
  Administered 2021-01-23 – 2021-01-27 (×6): 650 mg via ORAL
  Filled 2021-01-23 (×6): qty 2

## 2021-01-23 MED ORDER — POTASSIUM CHLORIDE 10 MEQ/100ML IV SOLN
10.0000 meq | INTRAVENOUS | Status: AC
  Administered 2021-01-23 (×4): 10 meq via INTRAVENOUS
  Filled 2021-01-23 (×4): qty 100

## 2021-01-23 MED ORDER — ENOXAPARIN SODIUM 40 MG/0.4ML IJ SOSY
40.0000 mg | PREFILLED_SYRINGE | INTRAMUSCULAR | Status: DC
  Administered 2021-01-23 – 2021-01-25 (×3): 40 mg via SUBCUTANEOUS
  Filled 2021-01-23 (×4): qty 0.4

## 2021-01-23 MED ORDER — LACTATED RINGERS IV BOLUS
1000.0000 mL | Freq: Once | INTRAVENOUS | Status: AC
  Administered 2021-01-23: 1000 mL via INTRAVENOUS

## 2021-01-23 MED ORDER — LIP MEDEX EX OINT
1.0000 "application " | TOPICAL_OINTMENT | CUTANEOUS | Status: DC | PRN
  Administered 2021-01-23: 1 via TOPICAL
  Filled 2021-01-23: qty 7

## 2021-01-23 MED ORDER — LACTATED RINGERS IV SOLN: INTRAVENOUS | Status: DC

## 2021-01-23 MED ORDER — POTASSIUM CHLORIDE CRYS ER 20 MEQ PO TBCR
40.0000 meq | EXTENDED_RELEASE_TABLET | Freq: Once | ORAL | Status: AC
  Administered 2021-01-23: 40 meq via ORAL
  Filled 2021-01-23: qty 2

## 2021-01-23 MED ORDER — MAGNESIUM SULFATE 2 GM/50ML IV SOLN
2.0000 g | Freq: Once | INTRAVENOUS | Status: AC
  Administered 2021-01-23: 2 g via INTRAVENOUS
  Filled 2021-01-23: qty 50

## 2021-01-23 NOTE — ED Triage Notes (Signed)
Body aches x 2 weeks. Dizziness and falling. Vomiting and diarrhea. States she has not been able to work.
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
## 2021-01-24 ENCOUNTER — Inpatient Hospital Stay (HOSPITAL_COMMUNITY): Payer: Medicaid Other

## 2021-01-24 ENCOUNTER — Encounter (HOSPITAL_COMMUNITY): Payer: Self-pay | Admitting: Internal Medicine

## 2021-01-24 DIAGNOSIS — B20 Human immunodeficiency virus [HIV] disease: Secondary | ICD-10-CM

## 2021-01-24 DIAGNOSIS — R112 Nausea with vomiting, unspecified: Secondary | ICD-10-CM | POA: Diagnosis not present

## 2021-01-24 DIAGNOSIS — R197 Diarrhea, unspecified: Secondary | ICD-10-CM | POA: Diagnosis not present

## 2021-01-24 DIAGNOSIS — M791 Myalgia, unspecified site: Secondary | ICD-10-CM

## 2021-01-24 DIAGNOSIS — D649 Anemia, unspecified: Secondary | ICD-10-CM

## 2021-01-24 DIAGNOSIS — E876 Hypokalemia: Secondary | ICD-10-CM

## 2021-01-24 DIAGNOSIS — F1210 Cannabis abuse, uncomplicated: Secondary | ICD-10-CM

## 2021-01-24 DIAGNOSIS — F17200 Nicotine dependence, unspecified, uncomplicated: Secondary | ICD-10-CM

## 2021-01-24 DIAGNOSIS — R531 Weakness: Secondary | ICD-10-CM

## 2021-01-24 LAB — CBC
HCT: 26.6 % — ABNORMAL LOW (ref 36.0–46.0)
Hemoglobin: 8.2 g/dL — ABNORMAL LOW (ref 12.0–15.0)
MCH: 24.6 pg — ABNORMAL LOW (ref 26.0–34.0)
MCHC: 30.8 g/dL (ref 30.0–36.0)
MCV: 79.6 fL — ABNORMAL LOW (ref 80.0–100.0)
Platelets: 226 10*3/uL (ref 150–400)
RBC: 3.34 MIL/uL — ABNORMAL LOW (ref 3.87–5.11)
RDW: 16.4 % — ABNORMAL HIGH (ref 11.5–15.5)
WBC: 4 10*3/uL (ref 4.0–10.5)
nRBC: 0 % (ref 0.0–0.2)

## 2021-01-24 LAB — COMPREHENSIVE METABOLIC PANEL
ALT: 11 U/L (ref 0–44)
AST: 17 U/L (ref 15–41)
Albumin: 2.5 g/dL — ABNORMAL LOW (ref 3.5–5.0)
Alkaline Phosphatase: 73 U/L (ref 38–126)
Anion gap: 6 (ref 5–15)
BUN: 13 mg/dL (ref 6–20)
CO2: 21 mmol/L — ABNORMAL LOW (ref 22–32)
Calcium: 8.5 mg/dL — ABNORMAL LOW (ref 8.9–10.3)
Chloride: 109 mmol/L (ref 98–111)
Creatinine, Ser: 0.8 mg/dL (ref 0.44–1.00)
GFR, Estimated: >60 mL/min (ref >60)
Glucose, Bld: 91 mg/dL (ref 70–99)
Potassium: 3.5 mmol/L (ref 3.5–5.1)
Sodium: 136 mmol/L (ref 135–145)
Total Bilirubin: 0.4 mg/dL (ref 0.3–1.2)
Total Protein: 7.6 g/dL (ref 6.5–8.1)

## 2021-01-24 LAB — LACTIC ACID, PLASMA: Lactic Acid, Venous: 1.5 mmol/L (ref 0.5–1.9)

## 2021-01-24 LAB — T-HELPER CELLS (CD4) COUNT (NOT AT ARMC)
CD4 % Helper T Cell: 1 % — ABNORMAL LOW (ref 33–65)
CD4 T Cell Abs: <35 /uL — ABNORMAL LOW (ref 400–1790)

## 2021-01-24 LAB — CK: Total CK: 26 U/L — ABNORMAL LOW (ref 38–234)

## 2021-01-24 LAB — MAGNESIUM: Magnesium: 1.7 mg/dL (ref 1.7–2.4)

## 2021-01-24 LAB — RPR: RPR Ser Ql: NONREACTIVE

## 2021-01-24 MED ORDER — LACTATED RINGERS IV BOLUS: 500.0000 mL | Freq: Once | INTRAVENOUS | Status: DC

## 2021-01-24 MED ORDER — LACTATED RINGERS IV BOLUS
500.0000 mL | Freq: Once | INTRAVENOUS | Status: AC
  Administered 2021-01-24: 500 mL via INTRAVENOUS

## 2021-01-24 MED ORDER — HYDROCODONE-ACETAMINOPHEN 5-325 MG PO TABS
1.0000 | ORAL_TABLET | Freq: Four times a day (QID) | ORAL | Status: DC | PRN
  Administered 2021-01-24 – 2021-01-27 (×8): 1 via ORAL
  Filled 2021-01-24 (×8): qty 1

## 2021-01-24 MED ORDER — SODIUM CHLORIDE 0.9 % IV SOLN
1.0000 g | INTRAVENOUS | Status: DC
  Administered 2021-01-24: 1 g via INTRAVENOUS
  Filled 2021-01-24: qty 1

## 2021-01-24 MED ORDER — BOOST / RESOURCE BREEZE PO LIQD CUSTOM
1.0000 | Freq: Three times a day (TID) | ORAL | Status: DC
  Administered 2021-01-24 – 2021-01-26 (×6): 1 via ORAL

## 2021-01-24 MED ORDER — IOHEXOL 9 MG/ML PO SOLN
500.0000 mL | ORAL | Status: AC
Start: 2021-01-24 — End: 2021-01-24
  Administered 2021-01-24 (×2): 500 mL via ORAL

## 2021-01-24 MED ORDER — POTASSIUM CHLORIDE CRYS ER 20 MEQ PO TBCR
40.0000 meq | EXTENDED_RELEASE_TABLET | Freq: Once | ORAL | Status: AC
  Administered 2021-01-24: 40 meq via ORAL
  Filled 2021-01-24: qty 2

## 2021-01-24 MED ORDER — ADULT MULTIVITAMIN W/MINERALS CH
1.0000 | ORAL_TABLET | Freq: Every day | ORAL | Status: DC
  Administered 2021-01-24 – 2021-01-27 (×4): 1 via ORAL
  Filled 2021-01-24 (×4): qty 1

## 2021-01-24 MED ORDER — IOHEXOL 350 MG/ML SOLN
60.0000 mL | Freq: Once | INTRAVENOUS | Status: AC | PRN
  Administered 2021-01-24: 60 mL via INTRAVENOUS

## 2021-01-24 MED ORDER — MAGNESIUM SULFATE 2 GM/50ML IV SOLN
2.0000 g | Freq: Once | INTRAVENOUS | Status: AC
  Administered 2021-01-24: 2 g via INTRAVENOUS
  Filled 2021-01-24: qty 50

## 2021-01-24 MED ORDER — SULFAMETHOXAZOLE-TRIMETHOPRIM 800-160 MG PO TABS
1.0000 | ORAL_TABLET | Freq: Every day | ORAL | Status: DC
  Administered 2021-01-25 – 2021-01-26 (×2): 1 via ORAL
  Filled 2021-01-24 (×2): qty 1

## 2021-01-24 MED ORDER — IOHEXOL 9 MG/ML PO SOLN
ORAL | Status: AC
  Filled 2021-01-24: qty 1000

## 2021-01-24 NOTE — Progress Notes (Signed)
Initial Nutrition Assessment  INTERVENTION:   -Boost Breeze po TID, each supplement provides 250 kcal and 9 grams of protein  -Magic cup BID with meals, each supplement provides 290 kcal and 9 grams of protein  -Multivitamin with minerals daily  NUTRITION DIAGNOSIS:   Increased nutrient needs related to chronic illness as evidenced by estimated needs.  GOAL:   Patient will meet greater than or equal to 90% of their needs  MONITOR:   PO intake, Supplement acceptance, Labs, Weight trends, I & O's  REASON FOR ASSESSMENT:   Malnutrition Screening Tool    ASSESSMENT:   40 y.o. female with history of AIDS who has been noncompliant with her medications presents to the ER with 2 weeks of abdominal discomfort which is generalized with nausea vomiting and diarrhea.  Patient states vomiting and diarrhea has been watery.  Denies any recent use of antibiotics.  Has not been taking her antiretrovirals for more than a month.  Patient has been unable to eat much d/t N/V and diarrhea over the past 1-2 weeks. Currently homeless as well, so uncertain of food availability.  Pt currently consuming 100% of meals at this time.  Pt with reported history of not tolerating Ensure/Boost milk based shakes. Will order Boost Breeze and Magic cups orange with meals.  Per weight records, pt has lost 15 lbs since 07/25/20 (11% wt loss x 6 months, significant for time frame).  Medications: KLOR-CON, Lactated ringers, IV Mg sulfate  Labs reviewed.  NUTRITION - FOCUSED PHYSICAL EXAM:  Deferred.  Diet Order:   Diet Order             Diet regular Room service appropriate? Yes; Fluid consistency: Thin  Diet effective now                   EDUCATION NEEDS:   No education needs have been identified at this time  Skin:  Skin Assessment: Reviewed RN Assessment  Last BM:  8/5 -type 6  Height:   Ht Readings from Last 1 Encounters:  01/23/21 '5\' 1"'$  (1.549 m)    Weight:   Wt Readings from  Last 1 Encounters:  01/23/21 50.5 kg    BMI:  Body mass index is 21.05 kg/m.  Estimated Nutritional Needs:   Kcal:  1600-1800  Protein:  75-90g  Fluid:  1.8L/day   Clayton Bibles, MS, RD, LDN Inpatient Clinical Dietitian Contact information available via Amion

## 2021-01-24 NOTE — Consult Note (Signed)
[name]    Date of Admission:  01/23/2021     Reason for Consult: N/V/D, advanced HIV     Referring Physician: Dr Cyndia Skeeters  Current antibiotics: Ceftriaxone 01/24/21  ASSESSMENT:    Nausea and vomiting, diarrhea: CT scan and stool studies pending to further evaluate her symptoms.  Unclear etiology at this time.  There is documentation of marijuana use which raises possibility of cannabis hyperemesis but would consider this a diagnosis of exclusion given her advanced immunosuppression.  If symptoms continue with negative work up she may need endoscopy. Fevers:  Low grade fever of 100.6 recorded today. Advanced HIV: CD4 count is less than 35 Bacteruria:  Patient not experiencing symptoms currently. Lymphadenopathy:  Noted pelvic LAD in March 2022 of unclear etiology.  CT scan today will help delineate if there is any improvement or progression.  PLAN:    Follow up CT scan Follow up GI PCR panel and C diff Defer starting ART back for now pending work up Check viral load Start Bactrim 1 DS daily for PCP prophylaxis Will stop ceftriaxone as does not appear to have symptoms of UTI.  Can follow CT scan to see if any bladder wall thickening or signs of pyelo. Dr West Bali to see over the weekend.   Principal Problem:   Vomiting and diarrhea Active Problems:   AIDS (acquired immune deficiency syndrome) (HCC)   Anemia   Hypokalemia   Homeless   Hypomagnesemia   Nausea vomiting and diarrhea   MEDICATIONS:    Scheduled Meds:  enoxaparin (LOVENOX) injection  40 mg Subcutaneous Q24H   feeding supplement  1 Container Oral TID BM   iohexol       multivitamin with minerals  1 tablet Oral Daily   Continuous Infusions:  cefTRIAXone (ROCEPHIN)  IV 1 g (01/24/21 1510)   lactated ringers 75 mL/hr at 01/24/21 0859   PRN Meds:.acetaminophen **OR** acetaminophen, HYDROcodone-acetaminophen, lip balm  HPI:    Beverly [name] is a 40 y.o. female with a past  medical history of advanced HIV disease and nonadherence, tobacco use, marijuana use, anxiety, depression who presented last night with 2 weeks of nausea and vomiting.  This has been followed by several days of watery, nonbloody diarrhea.  Due to these complaints she presented to the emergency department and was subsequently admitted.  Testing for C. difficile and a gastrointestinal panel was ordered but has not yet been obtained.  CT of the abdomen and pelvis is also currently pending.  Patient reports that she has had 4 watery bowel movements today.  She also endorses fatigue and weight loss.  She denies dyspnea with exertion or significant cough symptoms.  She reports left sided abdominal pain that radiates across her abdomen but does not correlate this with anything specific.  She does not have any dysuria. She does report nocturnal awakenings on occasion with cough symptoms.  She endorsed a couple episodes of fever and was febrile this afternoon to 100.6.  Her lower extremity skin lesions are unchanged. Her lab work-up is notable for hypokalemia, low albumin, normal LFTs, normal alk phos, WBC 3.8, hemoglobin 9.8, and lactic acid 1.9.  Her UA showed pyuria, positive nitrites, and many bacteria. Her CD4 count remains less than 35.  An HIV viral load has not yet been obtained.  She reports that she has been completely off her [place] for about 1 month.  Prior to that she states that she had been adherent in recent weeks.  She is currently living with her brother in [place] after having some housing issues here locally.   Past Medical History:  Diagnosis Date   Abscess    AIDS ([place]) 03/19/2015   Anxiety    Asthma    Blood transfusion without reported diagnosis    Depression    HIV infection ([place])    Homeless 03/19/2015   states stays with family or friends. Does not rent or own a home, but does not live on streets.   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   Skin ulcer ([place]) 05/06/2015    Social History   Tobacco Use   Smoking status: Every Day    Packs/day: 0.10    Years: 3.00    Pack years: 0.30    Types: Cigarettes   Smokeless tobacco: Never   Tobacco comments:    1-2 a day  Vaping Use   Vaping Use: Never used  Substance Use Topics   Alcohol use: Yes    Alcohol/week: 0.0 standard drinks    Comment: Ocassionally   Drug use: Yes    Frequency: 2.0 times per week    Types: Marijuana, MDMA (Ecstacy)    Comment: this morning     Family History  Problem Relation Age of Onset   Throat cancer Mother        smoker   Throat cancer Father        smoker   Hypertension Father    Cirrhosis Father    Hypertension Other    Cancer Other        smoker   Diabetes Other    Asthma Other     Allergies  Allergen Reactions   Bee Venom Anaphylaxis, Shortness Of Breath and Swelling   Morphine And Related Hives and Itching    Just can't take "morphine", vicodin is OK    ROS As noted in HPI, otherwise negative.   OBJECTIVE:   Blood pressure (!) 103/56, pulse 95, temperature (!) 100.6 F (38.1 C), temperature source Oral, resp. rate 16, height 5' 1" (1.549 m), weight 50.5 kg, last menstrual period 01/23/2021, SpO2 100 %. Body mass index is 21.05 kg/m.  Physical Exam Constitutional:      General: She is not in acute distress.    Appearance: Normal appearance.  HENT:     Head: Normocephalic and atraumatic.     Mouth/Throat:     Comments: No thrush. Eyes:     Extraocular Movements: Extraocular movements intact.     Conjunctiva/sclera: Conjunctivae normal.  Cardiovascular:     Rate and Rhythm: Normal rate and regular rhythm.  Pulmonary:     Effort: Pulmonary effort is normal. No respiratory distress.     Breath sounds: Normal breath sounds.  Abdominal:     General: There is no distension.     Palpations: Abdomen is soft.  Musculoskeletal:         General: Normal range of motion.     Cervical back: Normal range of motion and neck supple.     Right lower leg: No edema.     Left lower leg: No edema.  Lymphadenopathy:     Cervical: No cervical adenopathy.  Skin:    General: Skin is warm and dry.     Comments: Chronic appearing skin lesions on bilateral LE  Neurological:     General: No focal deficit present.  Mental Status: She is alert and oriented to person, place, and time.  Psychiatric:        Mood and Affect: Mood normal.        Behavior: Behavior normal.     Lab Results: Lab Results  Component Value Date   WBC 4.0 01/24/2021   HGB 8.2 (L) 01/24/2021   HCT 26.6 (L) 01/24/2021   MCV 79.6 (L) 01/24/2021   PLT 226 01/24/2021    Lab Results  Component Value Date   NA 136 01/24/2021   K 3.5 01/24/2021   CO2 21 (L) 01/24/2021   GLUCOSE 91 01/24/2021   BUN 13 01/24/2021   CREATININE 0.80 01/24/2021   CALCIUM 8.5 (L) 01/24/2021   GFRNONAA >60 01/24/2021   GFRAA 132 12/26/2019    Lab Results  Component Value Date   ALT 11 01/24/2021   AST 17 01/24/2021   ALKPHOS 73 01/24/2021   BILITOT 0.4 01/24/2021     [facility] [phone] pager 01/24/2021, 4:25 PM  I spent greater than 110 minutes with the patient including greater than 50% of time in face to face counsel of the patient and in coordination of their care.

## 2021-01-24 NOTE — H&P (Signed)
History and Physical    Beverly [name] [id number] DOB: Dec 25, 1980 DOA: 01/23/2021  PCP: Pcp, No  Patient coming from: Home.  Chief Complaint: Abdominal pain with nausea vomiting and diarrhea.  HPI: Beverly [name] is a 40 y.o. female with history of AIDS who has been noncompliant with her medications presents to the ER with 2 weeks of abdominal discomfort which is generalized with nausea vomiting and diarrhea.  Patient states vomiting and diarrhea has been watery.  Denies any recent use of antibiotics.  Has not been taking her antiretrovirals for more than a month.  ED Course: In the ER labs show potassium of 2.8 hemoglobin 9.8 WBC 3.8 lactic acid was normal.  COVID test was negative.  Was given potassium replacement IV fluids admitted for nausea vomiting and diarrhea.  Review of Systems: As per HPI, rest all negative.   Past Medical History:  Diagnosis Date   Abscess    AIDS ([place]) 03/19/2015   Anxiety    Asthma    Blood transfusion without reported diagnosis    Depression    HIV infection ([place])    Homeless 03/19/2015   states stays with family or friends. Does not rent or own a home, but does not live on streets.   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   Skin ulcer ([place]) 05/06/2015    Past Surgical History:  Procedure Laterality Date   CERVICAL CONIZATION W/BX N/A 04/14/2018   Procedure: COLD KNIFE CONIZATION CERVIX WITH BIOPSY;  Surgeon: Isabel Caprice, MD;  Location: [facility];  Service: Gynecology;  Laterality: N/A;   CESAREAN SECTION     DILATION AND CURETTAGE OF UTERUS N/A 04/14/2018   Procedure: ENDOCERVICAL CURETTAGE;  Surgeon: Isabel Caprice, MD;  [facility];  Service: Gynecology;  Laterality: N/A;   INCISION AND DRAINAGE ABSCESS Right 01/10/2016   Procedure: INCISION AND DRAINAGE RIGHT BUTTOCK,  LEFT LABIAL , EXCISION AND DRAINAGE OF  RIGHT AXILLARY ABSCESS;  Surgeon: Excell Seltzer, MD;  Location: [facility];  Service: General;  Laterality: Right;   TUBAL LIGATION  2005   VULVA Milagros Loll BIOPSY N/A 04/14/2018   Procedure: VULVAR BIOPSIES;  Surgeon: Isabel Caprice, MD;  Location: [facility];  Service: Gynecology;  Laterality: N/A;     reports that she has been smoking cigarettes. She has a 0.30 pack-year smoking history. She has never used smokeless tobacco. She reports current alcohol use. She reports current drug use. Frequency: 2.00 times per week. Drugs: Marijuana and MDMA (Ecstacy).  Allergies  Allergen Reactions   Bee Venom Anaphylaxis, Shortness Of Breath and Swelling   Morphine And Related Hives and Itching    Just can't take "morphine", vicodin is OK    Family History  Problem Relation Age of Onset   Throat cancer Mother        smoker   Throat cancer Father        smoker   Hypertension Father    Cirrhosis Father    Hypertension Other    Cancer Other        smoker   Diabetes Other    Asthma Other     Prior to Admission medications   Medication Sig Start Date End Date Taking? Authorizing Provider  acetaminophen (TYLENOL) 500 MG tablet Take 500 mg by mouth every 6 (six) hours as needed for  moderate pain.   Yes [provider]  bictegravir-emtricitabine-tenofovir AF (BIKTARVY) 50-200-25 MG TABS tablet Take 1 tablet by mouth daily. 09/10/20  Yes Rosiland Oz, MD  gabapentin (NEURONTIN) 300 MG capsule Take 1 capsule (300 mg total) by mouth 2 (two) times daily. 10/02/20  Yes Freeman Caldron M, PA-C  sulfamethoxazole-trimethoprim (BACTRIM DS) 800-160 MG tablet Take 1 tablet by mouth 3 (three) times a week. 09/11/20  Yes Rosiland Oz, MD  diphenhydrAMINE (BENADRYL ALLERGY) 25 mg capsule Take 1 capsule (25 mg total) by mouth every 6 (six) hours as needed. 09/10/20   Rosiland Oz, MD  valACYclovir (VALTREX) 500 MG tablet TAKE 1 TABLET  (500 MG TOTAL) BY MOUTH TWO TIMES DAILY FOR 12 DAYS. 08/29/20 08/29/21  Nita Sells, MD    Physical Exam: Constitutional: Moderately built and poorly nourished. Vitals:   01/23/21 1500 01/23/21 1700 01/23/21 1929 01/24/21 0018  BP: 109/73 113/81 114/82 104/78  Pulse: 85 92 90 83  Resp: 20 (!) '22 18 17  '$ Temp:   99.5 F (37.5 C) 98.4 F (36.9 C)  TempSrc:   Oral Oral  SpO2: 100% 100% 100% 99%  Weight:      Height:       Eyes: Anicteric no pallor. ENMT: No discharge from the ears eyes nose and mouth. Neck: No mass felt.  No neck rigidity. Respiratory: No rhonchi or crepitations. Cardiovascular: S1-S2 heard. Abdomen: Soft nontender bowel sounds present. Musculoskeletal: No edema. Skin: Chronic skin rash. Neurologic: Alert awake oriented time place and person.  Moves all extremities. Psychiatric: Appears normal.  Normal affect.   Labs on Admission: I have personally reviewed following labs and imaging studies  CBC: Recent Labs  Lab 01/23/21 1356 01/23/21 2133 01/24/21 0011  WBC 3.8* 4.1 4.0  NEUTROABS 2.7  --   --   HGB 9.8* 8.3* 8.2*  HCT 31.1* 27.4* 26.6*  MCV 77.4* 79.9* 79.6*  PLT 273 229 [id number]   Basic Metabolic Panel: Recent Labs  Lab 01/23/21 1356 01/23/21 2133 01/24/21 0011  NA 132* 131* 136  K 2.8* 3.3* 3.5  CL 101 105 109  CO2 22 21* 21*  GLUCOSE 89 131* 91  BUN '18 13 13  '$ CREATININE 0.86 0.75 0.80  CALCIUM 8.6* 8.0* 8.5*  MG 1.5* 1.7  --   PHOS 4.4  --   --    GFR: Estimated Creatinine Clearance: 71.9 mL/min (by C-G formula based on SCr of 0.8 mg/dL). Liver Function Tests: Recent Labs  Lab 01/23/21 1356 01/24/21 0011  AST 21 17  ALT 13 11  ALKPHOS 87 73  BILITOT 0.4 0.4  PROT 9.1* 7.6  ALBUMIN 2.9* 2.5*   Recent Labs  Lab 01/23/21 1356  LIPASE 36   No results for input(s): AMMONIA in the last 168 hours. Coagulation Profile: Recent Labs  Lab 01/23/21 1356  INR 1.1   Cardiac Enzymes: No results for input(s): CKTOTAL,  CKMB, CKMBINDEX, TROPONINI in the last 168 hours. BNP (last 3 results) No results for input(s): PROBNP in the last 8760 hours. HbA1C: No results for input(s): HGBA1C in the last 72 hours. CBG: No results for input(s): GLUCAP in the last 168 hours. Lipid Profile: No results for input(s): CHOL, HDL, LDLCALC, TRIG, CHOLHDL, LDLDIRECT in the last 72 hours. Thyroid Function Tests: No results for input(s): TSH, T4TOTAL, FREET4, T3FREE, THYROIDAB in the last 72 hours. Anemia Panel: No results for input(s): VITAMINB12, FOLATE, FERRITIN, TIBC, IRON, RETICCTPCT in the last 72 hours. Urine analysis:    Component Value Date/Time  COLORURINE YELLOW 01/23/2021 1356   APPEARANCEUR HAZY (A) 01/23/2021 1356   LABSPEC 1.010 01/23/2021 1356   PHURINE 6.5 01/23/2021 1356   GLUCOSEU NEGATIVE 01/23/2021 1356   GLUCOSEU NEG mg/dL 06/28/2008 2142   HGBUR TRACE (A) 01/23/2021 1356   BILIRUBINUR NEGATIVE 01/23/2021 1356   KETONESUR NEGATIVE 01/23/2021 1356   PROTEINUR 30 (A) 01/23/2021 1356   UROBILINOGEN 1.0 08/12/2014 2350   NITRITE POSITIVE (A) 01/23/2021 1356   LEUKOCYTESUR LARGE (A) 01/23/2021 1356   Sepsis Labs: '@LABRCNTIP'$ (procalcitonin:4,lacticidven:4) ) Recent Results (from the past 240 hour(s))  Resp Panel by RT-PCR (Flu A&B, Covid) Nasopharyngeal Swab     Status: None   Collection Time: 01/23/21  2:00 PM   Specimen: Nasopharyngeal Swab; Nasopharyngeal(NP) swabs in vial transport medium  Result Value Ref Range Status   SARS Coronavirus 2 by RT PCR NEGATIVE NEGATIVE Final    Comment: (NOTE) SARS-CoV-2 target nucleic acids are NOT DETECTED.  The SARS-CoV-2 RNA is generally detectable in upper respiratory specimens during the acute phase of infection. The lowest concentration of SARS-CoV-2 viral copies this assay can detect is 138 copies/mL. A negative result does not preclude SARS-Cov-2 infection and should not be used as the sole basis for treatment or other patient management  decisions. A negative result may occur with  improper specimen collection/handling, submission of specimen other than nasopharyngeal swab, presence of viral mutation(s) within the areas targeted by this assay, and inadequate number of viral copies(<138 copies/mL). A negative result must be combined with clinical observations, patient history, and epidemiological information. The expected result is Negative.  Fact Sheet for Patients:  EntrepreneurPulse.com.au  Fact Sheet for Healthcare Providers:  IncredibleEmployment.be  This test is no t yet approved or cleared by the [place] FDA and  has been authorized for detection and/or diagnosis of SARS-CoV-2 by FDA under an Emergency Use Authorization (EUA). This EUA will remain  in effect (meaning this test can be used) for the duration of the COVID-19 declaration under Section 564(b)(1) of the Act, 21 U.S.C.section 360bbb-3(b)(1), unless the authorization is terminated  or revoked sooner.       Influenza A by PCR NEGATIVE NEGATIVE Final   Influenza B by PCR NEGATIVE NEGATIVE Final    Comment: (NOTE) The Xpert Xpress SARS-CoV-2/FLU/RSV plus assay is intended as an aid in the diagnosis of influenza from Nasopharyngeal swab specimens and should not be used as a sole basis for treatment. Nasal washings and aspirates are unacceptable for Xpert Xpress SARS-CoV-2/FLU/RSV testing.  Fact Sheet for Patients: EntrepreneurPulse.com.au  Fact Sheet for Healthcare Providers: IncredibleEmployment.be  This test is not yet approved or cleared by the [place] FDA and has been authorized for detection and/or diagnosis of SARS-CoV-2 by FDA under an Emergency Use Authorization (EUA). This EUA will remain in effect (meaning this test can be used) for the duration of the COVID-19 declaration under Section 564(b)(1) of the Act, 21 U.S.C. section 360bbb-3(b)(1), unless the  authorization is terminated or revoked.  Performed at [facility], [address] [place], [place] [address]      Radiological Exams on Admission: DG Chest [facility] 1 View  Result Date: 01/23/2021 CLINICAL DATA:  Vomiting for 2 weeks with history of body aches and dizziness EXAM: PORTABLE CHEST 1 VIEW COMPARISON:  August 26, 2020. FINDINGS: Cardiomediastinal contours and hilar structures are normal. No lobar consolidation. No sign of pleural effusion. No visible pneumothorax. On limited assessment no acute skeletal process. IMPRESSION: No acute cardiopulmonary disease. Electronically Signed   By:  Zetta Bills M.D.   On: 01/23/2021 14:27      Assessment/Plan Principal Problem:   Vomiting and diarrhea Active Problems:   AIDS (acquired immune deficiency syndrome) (HCC)   Anemia   Hypokalemia   Homeless   Hypomagnesemia   Nausea vomiting and diarrhea    Nausea vomiting diarrhea with abdominal discomfort concerning for colitis.  Given that patient has AIDS will check GI pathogen panel.  Gently hydrate.  Because patient has diffuse abdominal tenderness I ordered CT abdomen pelvis. Hypokalemia likely from vomiting and diarrhea.  Replace recheck. Aids -has been noncompliant with her antiretroviral.  Check CD4 count.  Will need ID input. Chronic anemia follow CBC.  Check anemia panel with next blood draw. During last admission in March 2022 patient's RPR was positive.  We will check RPR again.  Since patient has persistent nausea vomiting diarrhea with diffuse abdominal discomfort will need close monitoring since patient has immunocompromise state with AIDS.  Will need inpatient status.   DVT prophylaxis: Lovenox. Code Status: Full code. Family Communication: Discussed with patient. Disposition Plan: Home. Consults called: We will need to consult ID. Admission status: Inpatient.   Rise Patience MD [facility] Pager [phone].  If 7PM-7AM, please contact  night-coverage [email or web address] Password [email or web address]  01/24/2021, 2:05 AM

## 2021-01-24 NOTE — Progress Notes (Signed)
PROGRESS NOTE  Beverly [name] [id number] DOB: 25-Dec-1980   PCP: Pcp, No  Patient is from: Home.  Lives with her brother.  DOA: 01/23/2021 LOS: 1  Chief complaints:  Chief Complaint  Patient presents with   Generalized Body Aches     Brief Narrative / Interim history: 40 year old F with history of HIV noncompliant with medications, tobacco use, marijuana use, alcohol use, anxiety and depression presenting with abdominal discomfort, nausea, vomiting and diarrhea, and admitted for the same.  She was also hypokalemic.  Started on IV fluid, potassium supplementation and antiemetics.  C. difficile, GIP and CT abdomen and pelvis ordered.  COVID-19 and influenza PCR was nonreactive.  UA concerning for UTI.   Subjective: Seen and examined earlier this morning.  She reports generalized body pain, mainly on the left side.  Pain is not necessarily acute but worse.  She also reports nausea after she tried to drink oral contrast.  She denies emesis.  She reports diarrhea that she describes a watery stool.  She reports 4 episodes last night although only one charted, and the staff waiting on a stool sample for C. difficile and GIP.  She denies melena or hematochezia.  She reports urgency but denies dysuria, frequency or hematuria.  Objective: Vitals:   01/23/21 1929 01/24/21 0018 01/24/21 0316 01/24/21 0555  BP: 114/82 104/78 (!) 85/59 125/77  Pulse: 90 83 63 93  Resp: '18 17 18   '$ Temp: 99.5 F (37.5 C) 98.4 F (36.9 C) 98.1 F (36.7 C) 98.7 F (37.1 C)  TempSrc: Oral Oral Oral Oral  SpO2: 100% 99% 100% 99%  Weight:      Height:        Intake/Output Summary (Last 24 hours) at 01/24/2021 1242 Last data filed at 01/24/2021 0342 Gross per 24 hour  Intake 1653.99 ml  Output 1 ml  Net 1652.99 ml   Filed Weights   01/23/21 1206  Weight: 50.5 kg    Examination:  GENERAL: No apparent distress.  Nontoxic. HEENT: MMM.  Vision and hearing grossly intact.  NECK: Supple.  No apparent JVD.   RESP: On RA.  No IWOB.  Fair aeration bilaterally. CVS:  RRR. Heart sounds normal.  ABD/GI/GU: BS+. Abd soft.  Diffuse tenderness out of proportion, even with the lightest touch MSK/EXT:  Moves extremities. No apparent deformity. No edema.  SKIN: Chronic skin lesions in both lower extremities.  Kaposi's sarcoma? NEURO: Awake, alert and oriented appropriately.  No apparent focal neuro deficit. PSYCH: Calm. Normal affect.   Procedures:  None  Microbiology summarized: [id number] and influenza PCR nonreactive. C. difficile and GIP pending. Urine culture pending.  Assessment & Plan: Nausea, vomiting, diarrhea and abdominal pain-she reports 4 episodes of watery diarrhea overnight although only one charted.  Staff has been waiting on a stool sample for C. difficile and PCR.  Tenderness with the lightest touch on abdominal exam.  Not febrile.  Hemodynamically stable.  No leukocytosis.  Lactic acid within normal arguing against ischemic colitis.  She has history of marijuana use which could contribute to cannabinoid hyperemesis syndrome.  Patient is immunocompromised due to HIV/AIDS.  -Follow CT abdomen and pelvis -Follow C. difficile and GIP if able to obtain a stool sample -Continue IV fluid, antiemetics and analgesics -Warn against marijuana use but she does not agree  Urinary tract infection: Patient reports some urgency although no dysuria or frequency.  UA concerning for UTI. -Check urine culture -Start IV ceftriaxone-which could be due to some coverage for possible intra-abdominal  infection as well  Myalgia/generalized body pain: Unclear etiology of this.  Withdrawal? -Check CK -As needed Tylenol and Norco  Hypokalemia: K2.8>> 3.5.  Mg 1.7 -P.o. K-Dur 40 mill equivalent x1  -IV magnesium sulfate 2 g x 1  HIV/AIDS: Not compliant with HIV meds.  On 08/26/2020, CD4 <35, and quantitative viral RNA 331K -ID consulted  Anemia of chronic disease: H&H relatively stable.  Seems to display  transition from hemoconcentration to hemodilution.  She denies melena or hematochezia. Recent Labs    07/25/20 1946 08/26/20 1155 08/28/20 0533 08/29/20 0037 08/30/20 0048 01/23/21 1356 01/23/21 2133 01/24/21 0011  HGB 10.1* 9.7* 8.3* 8.4* 8.3* 9.8* 8.3* 8.2*  -Continue monitoring -Check anemia panel in the morning  Positive RPR: Head T. pallidum antibody was negative.  Unlikely syphilis.  Marijuana use/tobacco use disorder/alcohol use-denies using any of these in the last 3 weeks -Encourage cessation.   Body mass index is 21.05 kg/m.         DVT prophylaxis:  enoxaparin (LOVENOX) injection 40 mg Start: 01/23/21 2200  Code Status: Full code Family Communication: Patient and/or RN. Available if any question.  Level of care: Telemetry Status is: Inpatient  Remains inpatient appropriate because:Persistent severe electrolyte disturbances, Ongoing diagnostic testing needed not appropriate for outpatient work up, IV treatments appropriate due to intensity of illness or inability to take PO, and Inpatient level of care appropriate due to severity of illness  Dispo: The patient is from: Home              Anticipated d/c is to: Home              Patient currently is not medically stable to d/c.   Difficult to place patient No       Consultants:  Infectious disease   Sch Meds:  Scheduled Meds:  enoxaparin (LOVENOX) injection  40 mg Subcutaneous Q24H   iohexol       Continuous Infusions:  cefTRIAXone (ROCEPHIN)  IV     lactated ringers 75 mL/hr at 01/24/21 0859   PRN Meds:.acetaminophen **OR** acetaminophen, HYDROcodone-acetaminophen, lip balm  Antimicrobials: Anti-infectives (From admission, onward)    Start     Dose/Rate Route Frequency Ordered Stop   01/24/21 1400  cefTRIAXone (ROCEPHIN) 1 g in sodium chloride 0.9 % 100 mL IVPB        1 g 200 mL/hr over 30 Minutes Intravenous Every 24 hours 01/24/21 1239          I have personally reviewed the  following labs and images: CBC: Recent Labs  Lab 01/23/21 1356 01/23/21 2133 01/24/21 0011  WBC 3.8* 4.1 4.0  NEUTROABS 2.7  --   --   HGB 9.8* 8.3* 8.2*  HCT 31.1* 27.4* 26.6*  MCV 77.4* 79.9* 79.6*  PLT 273 229 226   BMP &GFR Recent Labs  Lab 01/23/21 1356 01/23/21 2133 01/24/21 0011  NA 132* 131* 136  K 2.8* 3.3* 3.5  CL 101 105 109  CO2 22 21* 21*  GLUCOSE 89 131* 91  BUN '18 13 13  '$ CREATININE 0.86 0.75 0.80  CALCIUM 8.6* 8.0* 8.5*  MG 1.5* 1.7 1.7  PHOS 4.4  --   --    Estimated Creatinine Clearance: 71.9 mL/min (by C-G formula based on SCr of 0.8 mg/dL). Liver & Pancreas: Recent Labs  Lab 01/23/21 1356 01/24/21 0011  AST 21 17  ALT 13 11  ALKPHOS 87 73  BILITOT 0.4 0.4  PROT 9.1* 7.6  ALBUMIN 2.9* 2.5*  Recent Labs  Lab 01/23/21 1356  LIPASE 36   No results for input(s): AMMONIA in the last 168 hours. Diabetic: No results for input(s): HGBA1C in the last 72 hours. No results for input(s): GLUCAP in the last 168 hours. Cardiac Enzymes: No results for input(s): CKTOTAL, CKMB, CKMBINDEX, TROPONINI in the last 168 hours. No results for input(s): PROBNP in the last 8760 hours. Coagulation Profile: Recent Labs  Lab 01/23/21 1356  INR 1.1   Thyroid Function Tests: No results for input(s): TSH, T4TOTAL, FREET4, T3FREE, THYROIDAB in the last 72 hours. Lipid Profile: No results for input(s): CHOL, HDL, LDLCALC, TRIG, CHOLHDL, LDLDIRECT in the last 72 hours. Anemia Panel: No results for input(s): VITAMINB12, FOLATE, FERRITIN, TIBC, IRON, RETICCTPCT in the last 72 hours. Urine analysis:    Component Value Date/Time   COLORURINE YELLOW 01/23/2021 1356   APPEARANCEUR HAZY (A) 01/23/2021 1356   LABSPEC 1.010 01/23/2021 1356   PHURINE 6.5 01/23/2021 1356   GLUCOSEU NEGATIVE 01/23/2021 1356   GLUCOSEU NEG mg/dL 06/28/2008 2142   HGBUR TRACE (A) 01/23/2021 1356   BILIRUBINUR NEGATIVE 01/23/2021 1356   KETONESUR NEGATIVE 01/23/2021 1356   PROTEINUR  30 (A) 01/23/2021 1356   UROBILINOGEN 1.0 08/12/2014 2350   NITRITE POSITIVE (A) 01/23/2021 1356   LEUKOCYTESUR LARGE (A) 01/23/2021 1356   Sepsis Labs: Invalid input(s): PROCALCITONIN, [place]  Microbiology: Recent Results (from the past 240 hour(s))  Resp Panel by RT-PCR (Flu A&B, Covid) Nasopharyngeal Swab     Status: None   Collection Time: 01/23/21  2:00 PM   Specimen: Nasopharyngeal Swab; Nasopharyngeal(NP) swabs in vial transport medium  Result Value Ref Range Status   SARS Coronavirus 2 by RT PCR NEGATIVE NEGATIVE Final    Comment: (NOTE) SARS-CoV-2 target nucleic acids are NOT DETECTED.  The SARS-CoV-2 RNA is generally detectable in upper respiratory specimens during the acute phase of infection. The lowest concentration of SARS-CoV-2 viral copies this assay can detect is 138 copies/mL. A negative result does not preclude SARS-Cov-2 infection and should not be used as the sole basis for treatment or other patient management decisions. A negative result may occur with  improper specimen collection/handling, submission of specimen other than nasopharyngeal swab, presence of viral mutation(s) within the areas targeted by this assay, and inadequate number of viral copies(<138 copies/mL). A negative result must be combined with clinical observations, patient history, and epidemiological information. The expected result is Negative.  Fact Sheet for Patients:  EntrepreneurPulse.com.au  Fact Sheet for Healthcare Providers:  IncredibleEmployment.be  This test is no t yet approved or cleared by the [place] FDA and  has been authorized for detection and/or diagnosis of SARS-CoV-2 by FDA under an Emergency Use Authorization (EUA). This EUA will remain  in effect (meaning this test can be used) for the duration of the COVID-19 declaration under Section 564(b)(1) of the Act, 21 U.S.C.section 360bbb-3(b)(1), unless the authorization is  terminated  or revoked sooner.       Influenza A by PCR NEGATIVE NEGATIVE Final   Influenza B by PCR NEGATIVE NEGATIVE Final    Comment: (NOTE) The Xpert Xpress SARS-CoV-2/FLU/RSV plus assay is intended as an aid in the diagnosis of influenza from Nasopharyngeal swab specimens and should not be used as a sole basis for treatment. Nasal washings and aspirates are unacceptable for Xpert Xpress SARS-CoV-2/FLU/RSV testing.  Fact Sheet for Patients: EntrepreneurPulse.com.au  Fact Sheet for Healthcare Providers: IncredibleEmployment.be  This test is not yet approved or cleared by the [place] FDA and has  been authorized for detection and/or diagnosis of SARS-CoV-2 by FDA under an Emergency Use Authorization (EUA). This EUA will remain in effect (meaning this test can be used) for the duration of the COVID-19 declaration under Section 564(b)(1) of the Act, 21 U.S.C. section 360bbb-3(b)(1), unless the authorization is terminated or revoked.  Performed at [facility], [address] [place], [place] [address]     Radiology Studies: DG Chest [facility] 1 View  Result Date: 01/23/2021 CLINICAL DATA:  Vomiting for 2 weeks with history of body aches and dizziness EXAM: PORTABLE CHEST 1 VIEW COMPARISON:  August 26, 2020. FINDINGS: Cardiomediastinal contours and hilar structures are normal. No lobar consolidation. No sign of pleural effusion. No visible pneumothorax. On limited assessment no acute skeletal process. IMPRESSION: No acute cardiopulmonary disease. Electronically Signed   By: Zetta Bills M.D.   On: 01/23/2021 14:27      Chan Rosasco T. [place]  If 7PM-7AM, please contact night-coverage [email or web address] 01/24/2021, 12:42 PM

## 2021-01-25 DIAGNOSIS — R197 Diarrhea, unspecified: Secondary | ICD-10-CM

## 2021-01-25 DIAGNOSIS — E871 Hypo-osmolality and hyponatremia: Secondary | ICD-10-CM

## 2021-01-25 DIAGNOSIS — R1110 Vomiting, unspecified: Secondary | ICD-10-CM

## 2021-01-25 DIAGNOSIS — D509 Iron deficiency anemia, unspecified: Secondary | ICD-10-CM

## 2021-01-25 LAB — CBC
HCT: 25.9 % — ABNORMAL LOW (ref 36.0–46.0)
Hemoglobin: 7.9 g/dL — ABNORMAL LOW (ref 12.0–15.0)
MCH: 24.4 pg — ABNORMAL LOW (ref 26.0–34.0)
MCHC: 30.5 g/dL (ref 30.0–36.0)
MCV: 79.9 fL — ABNORMAL LOW (ref 80.0–100.0)
Platelets: 228 10*3/uL (ref 150–400)
RBC: 3.24 MIL/uL — ABNORMAL LOW (ref 3.87–5.11)
RDW: 16.8 % — ABNORMAL HIGH (ref 11.5–15.5)
WBC: 3.4 10*3/uL — ABNORMAL LOW (ref 4.0–10.5)
nRBC: 0 % (ref 0.0–0.2)

## 2021-01-25 LAB — FERRITIN: Ferritin: 101 ng/mL (ref 11–307)

## 2021-01-25 LAB — RENAL FUNCTION PANEL
Albumin: 2.2 g/dL — ABNORMAL LOW (ref 3.5–5.0)
Anion gap: 4 — ABNORMAL LOW (ref 5–15)
BUN: 11 mg/dL (ref 6–20)
CO2: 23 mmol/L (ref 22–32)
Calcium: 8 mg/dL — ABNORMAL LOW (ref 8.9–10.3)
Chloride: 106 mmol/L (ref 98–111)
Creatinine, Ser: 0.59 mg/dL (ref 0.44–1.00)
GFR, Estimated: >60 mL/min (ref >60)
Glucose, Bld: 83 mg/dL (ref 70–99)
Phosphorus: 4.1 mg/dL (ref 2.5–4.6)
Potassium: 3.8 mmol/L (ref 3.5–5.1)
Sodium: 133 mmol/L — ABNORMAL LOW (ref 135–145)

## 2021-01-25 LAB — RETICULOCYTES
RBC.: 3.27 MIL/uL — ABNORMAL LOW (ref 3.87–5.11)
Retic Ct Pct: <0.4 % — ABNORMAL LOW (ref 0.4–3.1)

## 2021-01-25 LAB — C DIFFICILE QUICK SCREEN W PCR REFLEX
C Diff antigen: NEGATIVE
C Diff interpretation: NEGATIVE
C Diff toxin: NEGATIVE

## 2021-01-25 LAB — IRON AND TIBC
Iron: 10 ug/dL — ABNORMAL LOW (ref 28–170)
Saturation Ratios: 4 % — ABNORMAL LOW (ref 10.4–31.8)
TIBC: 225 ug/dL — ABNORMAL LOW (ref 250–450)
UIBC: 215 ug/dL

## 2021-01-25 LAB — FOLATE: Folate: 9.3 ng/mL (ref >5.9)

## 2021-01-25 LAB — MAGNESIUM: Magnesium: 1.5 mg/dL — ABNORMAL LOW (ref 1.7–2.4)

## 2021-01-25 LAB — VITAMIN B12: Vitamin B-12: 212 pg/mL (ref 180–914)

## 2021-01-25 MED ORDER — MAGNESIUM SULFATE 2 GM/50ML IV SOLN
2.0000 g | Freq: Once | INTRAVENOUS | Status: AC
  Administered 2021-01-25: 2 g via INTRAVENOUS
  Filled 2021-01-25: qty 50

## 2021-01-25 MED ORDER — VITAMIN B-12 1000 MCG PO TABS
1000.0000 ug | ORAL_TABLET | Freq: Every day | ORAL | Status: DC
  Administered 2021-01-26 – 2021-01-27 (×2): 1000 ug via ORAL
  Filled 2021-01-25 (×2): qty 1

## 2021-01-25 NOTE — Evaluation (Signed)
Physical Therapy One Time Evaluation Patient Details Name: Beverly [name] MRN: [id number] DOB: January 04, 1981 Today's Date: 01/25/2021   History of Present Illness  Beverly [name] is a 40 y.o. female with history of AIDS, neuropathy. Pt admitted 01/23/21 for vomiting and diarrhea concerning for colitis.  Clinical Impression  Patient evaluated by Physical Therapy with no further acute PT needs identified. All education has been completed and the patient has no further questions. Pt washed hands at sink in room independently and able to ambulate in hallway without assist.  Pt mobilizing well and no further PT needs identified at this time. PT is signing off. Thank you for this referral.     Follow Up Recommendations No PT follow up    Equipment Recommendations  None recommended by PT    Recommendations for Other Services       Precautions / Restrictions Precautions Precautions: None Restrictions Weight Bearing Restrictions: No      Mobility  Bed Mobility Overal bed mobility: Independent                  Transfers Overall transfer level: Independent                  Ambulation/Gait Ambulation/Gait assistance: Independent Gait Distance (Feet): 400 Feet Assistive device: None Gait Pattern/deviations: WFL(Within Functional Limits)     General Gait Details: therapist pushed IV pole, pt reports she sometimes falls after a long day of being on her feet due to neuropathy, no unsteadiness or LOB observed today  Stairs            Wheelchair Mobility    Modified Rankin (Stroke Patients Only)       Balance Overall balance assessment: History of Falls (due to hx of neuropathy)                                           Pertinent Vitals/Pain Pain Assessment: No/denies pain    Home Living Family/patient expects to be discharged to:: Private residence Living Arrangements: Other relatives (brother) Available Help at Discharge:  Family;Available PRN/intermittently Type of Home: Apartment Home Access: Stairs to enter Entrance Stairs-Rails: Right Entrance Stairs-Number of Steps: 2 flights Home Layout: One level Home Equipment: None      Prior Function Level of Independence: Independent         Comments: Works at [employer]        Extremity/Trunk Assessment   Upper Extremity Assessment Upper Extremity Assessment: RUE deficits/detail;LUE deficits/detail RUE Deficits / Details: WNL ROM, 5/5 strength RUE Sensation: history of peripheral neuropathy (reports transient numbness/ tingling in fingers) RUE Coordination: WNL LUE Deficits / Details: WNL ROm, 5/5 strength LUE Sensation: history of peripheral neuropathy (reports transient numbness/tinging  in fingers)    Lower Extremity Assessment Lower Extremity Assessment: Overall WFL for tasks assessed;LLE deficits/detail;RLE deficits/detail RLE Sensation: history of peripheral neuropathy LLE Sensation: history of peripheral neuropathy    Cervical / Trunk Assessment Cervical / Trunk Assessment: Normal  Communication   Communication: No difficulties  Cognition Arousal/Alertness: Awake/alert Behavior During Therapy: WFL for tasks assessed/performed Overall Cognitive Status: Within Functional Limits for tasks assessed                                        General  Comments      Exercises     Assessment/Plan    PT Assessment Patent does not need any further PT services  PT Problem List         PT Treatment Interventions      PT Goals (Current goals can be found in the Care Plan section)  Acute Rehab PT Goals PT Goal Formulation: All assessment and education complete, DC therapy    Frequency     Barriers to discharge        Co-evaluation               AM-PAC PT "6 Clicks" Mobility  Outcome Measure Help needed turning from your back to your side while in a flat bed without using bedrails?:  None Help needed moving from lying on your back to sitting on the side of a flat bed without using bedrails?: None Help needed moving to and from a bed to a chair (including a wheelchair)?: None Help needed standing up from a chair using your arms (e.g., wheelchair or bedside chair)?: None Help needed to walk in hospital room?: None Help needed climbing 3-5 steps with a railing? : None 6 Click Score: 24    End of Session   Activity Tolerance: Patient tolerated treatment well Patient left: in bed;with call bell/phone within reach   PT Visit Diagnosis: Difficulty in walking, not elsewhere classified (R26.2)    Time: [id number] PT Time Calculation (min) (ACUTE ONLY): 14 min   Charges:   PT Evaluation $PT Eval Low Complexity: 1 Low     Kati PT, DPT Acute Rehabilitation Services Pager: [phone] Office: [phone]   Grisela Mesch,KATHrine E 01/25/2021, 1:20 PM

## 2021-01-25 NOTE — Progress Notes (Signed)
PROGRESS NOTE  Beverly [name] [id number] DOB: May 18, 1981   PCP: Pcp, No  Patient is from: Home.  Lives with her brother.  DOA: 01/23/2021 LOS: 2  Chief complaints:  Chief Complaint  Patient presents with   Generalized Body Aches     Brief Narrative / Interim history: 40 year old F with history of HIV noncompliant with medications, tobacco use, marijuana use, alcohol use, anxiety and depression presenting with abdominal discomfort, nausea, vomiting and diarrhea, and admitted for the same.  She was also hypokalemic.  Started on IV fluid, potassium supplementation and antiemetics.  C. difficile, GIP and CT abdomen and pelvis ordered.  COVID-19 and influenza PCR was nonreactive.  UA concerning for UTI but no UTI symptoms.  ID consulted, and started Bactrim for PPx.  Patient continues to endorse diarrhea, although staff couldn't even get samples for C. difficile and GIB.   Subjective: Seen and examined earlier this morning.  No major events overnight of this morning.  She endorses generalized body pain.  Not able to localize.  She rates her pain 8/10 although she appeared relaxed and conversing with family member over the phone when I walked in.  She also reports diarrhea.  She says she had 3 loose bowel movements last night, although staff is waiting to get samples for GIB and C. difficile for the last 48 hours.  She denies nausea, vomiting, abdominal pain, shortness of breath or UTI symptoms.  Objective: Vitals:   01/24/21 1407 01/24/21 2217 01/25/21 0545 01/25/21 1448  BP: (!) 103/56 94/60 114/70 109/80  Pulse: 95 84 (!) 108 84  Resp: '16 14 18 16  '$ Temp: (!) 100.6 F (38.1 C) 99.4 F (37.4 C) 99.8 F (37.7 C) (!) 97.4 F (36.3 C)  TempSrc: Oral  Oral Oral  SpO2: 100% 100% 100% 97%  Weight:      Height:       No intake or output data in the 24 hours ending 01/25/21 1655  Filed Weights   01/23/21 1206  Weight: 50.5 kg    Examination:  GENERAL: No apparent distress.   Nontoxic. HEENT: MMM.  Vision and hearing grossly intact.  NECK: Supple.  No apparent JVD.  RESP: On RA.  No IWOB.  Fair aeration bilaterally. CVS:  RRR. Heart sounds normal.  ABD/GI/GU: BS+. Abd seems soft.  Grimacing with the lightest touch MSK/EXT:  Moves extremities. No apparent deformity. No edema.  SKIN: Chronic skin lesions in both lower extremities.  No signs of active infection NEURO: Awake and alert. Oriented appropriately.  No apparent focal neuro deficit. PSYCH: Calm. Normal affect.   Procedures:  None  Microbiology summarized: [id number] and influenza PCR nonreactive. C. difficile and GIP pending. Urine culture pending.  Assessment & Plan: Nausea, vomiting, diarrhea and abdominal pain-continues to endorse diarrhea although staff has been waiting on a stool sample for C. difficile and PCR.  Grimacing with the lightest touch on abdominal exam.  Not febrile.  Hemodynamically stable.  No leukocytosis.  UA concerning but no UTI symptoms.  CT abdomen and pelvis without significant finding to explain patient's symptoms.  Lactic acid within normal arguing against ischemic colitis.  Seems to be tolerating regular diet.  She has history of marijuana use which could contribute to cannabinoid hyperemesis syndrome.  At this time, I doubt she has intra-abdominal infection but she is immunocompromised with very low CD4 counts and high viral load due to noncompliance with HIV meds which complicates her case. -Antiemetics and analgesics as needed -Will wait for ID  input.  Not sure if enteric precaution is indicated at this time.  Pyuria/bacteriuria: UTI?  Her history is inconsistent.  She reported some urgency yesterday but denied any UTI symptoms on subsequent interviews.  Her UA is concerning -Ceftriaxone discontinued by ID. -Currently on Bactrim for PJP prophylaxis  Myalgia/generalized body pain: Unclear etiology of this.  Withdrawal?.  She endorses 8/10 pain although she does not appear to  be in the much distress.  CK within normal. -As needed Tylenol and Norco  Hypokalemia/hypomagnesemia: Hypokalemia resolved.  Mg 1.5. -IV magnesium sulfate 2 g x 1  Hyponatremia: Unclear etiology.  She did not start Bactrim until this morning. 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 CD4 <35, and quantitative viral RNA pending (331K about 5 months ago). -ID consulted  Severe iron deficiency anemia: H&H relatively stable.  Anemia panel consistent with severe iron deficiency.  She denies melena or hematochezia. Recent Labs    07/25/20 1946 08/26/20 1155 08/28/20 0533 08/29/20 0037 08/30/20 0048 01/23/21 1356 01/23/21 2133 01/24/21 0011 01/25/21 0635  HGB 10.1* 9.7* 8.3* 8.4* 8.3* 9.8* 8.3* 8.2* 7.9*  -We will give her IV iron once active infection ruled out. -Start p.o. vitamin B12 supplementation -Monitor H&H  Mild leukopenia-unspecified. -Continue monitoring  Positive RPR: T. pallidum antibody was negative.  Unlikely syphilis.  Marijuana use/tobacco use disorder/alcohol use-denies using any of these in the last 3 weeks -Encourage cessation.   Body mass index is 21.05 kg/m. Nutrition Problem: Increased nutrient needs Etiology: chronic illness Signs/Symptoms: estimated needs Interventions: Boost Breeze, MVI, Magic cup   DVT prophylaxis:  enoxaparin (LOVENOX) injection 40 mg Start: 01/23/21 2200  Code Status: Full code Family Communication: Patient and/or RN. Available if any question.  Level of care: Telemetry Status is: Inpatient  Remains inpatient appropriate because:Persistent severe electrolyte disturbances, Ongoing diagnostic testing needed not appropriate for outpatient work up, and Inpatient level of care appropriate due to severity of illness  Dispo: The patient is from: Home              Anticipated d/c is to: Home              Patient currently is not medically stable to d/c.   Difficult to place patient  No       Consultants:  Infectious disease   Sch Meds:  Scheduled Meds:  enoxaparin (LOVENOX) injection  40 mg Subcutaneous Q24H   feeding supplement  1 Container Oral TID BM   multivitamin with minerals  1 tablet Oral Daily   sulfamethoxazole-trimethoprim  1 tablet Oral Daily   Continuous Infusions:   PRN Meds:.acetaminophen **OR** acetaminophen, HYDROcodone-acetaminophen, lip balm  Antimicrobials: Anti-infectives (From admission, onward)    Start     Dose/Rate Route Frequency Ordered Stop   01/25/21 1000  sulfamethoxazole-trimethoprim (BACTRIM DS) 800-160 MG per tablet 1 tablet        1 tablet Oral Daily 01/24/21 1654     01/24/21 1400  cefTRIAXone (ROCEPHIN) 1 g in sodium chloride 0.9 % 100 mL IVPB  Status:  Discontinued        1 g 200 mL/hr over 30 Minutes Intravenous Every 24 hours 01/24/21 1239 01/24/21 1654        I have personally reviewed the following labs and images: CBC: Recent Labs  Lab 01/23/21 1356 01/23/21 2133 01/24/21 0011 01/25/21 0635  WBC 3.8* 4.1 4.0 3.4*  NEUTROABS 2.7  --   --   --   HGB 9.8* 8.3* 8.2* 7.9*  HCT 31.1* 27.4* 26.6* 25.9*  MCV 77.4* 79.9* 79.6* 79.9*  PLT 273 229 226 228   BMP &GFR Recent Labs  Lab 01/23/21 1356 01/23/21 2133 01/24/21 0011 01/25/21 0635  NA 132* 131* 136 133*  K 2.8* 3.3* 3.5 3.8  CL 101 105 109 106  CO2 22 21* 21* 23  GLUCOSE 89 131* 91 83  BUN '18 13 13 11  '$ CREATININE 0.86 0.75 0.80 0.59  CALCIUM 8.6* 8.0* 8.5* 8.0*  MG 1.5* 1.7 1.7 1.5*  PHOS 4.4  --   --  4.1   Estimated Creatinine Clearance: 71.9 mL/min (by C-G formula based on SCr of 0.59 mg/dL). Liver & Pancreas: Recent Labs  Lab 01/23/21 1356 01/24/21 0011 01/25/21 0635  AST 21 17  --   ALT 13 11  --   ALKPHOS 87 73  --   BILITOT 0.4 0.4  --   PROT 9.1* 7.6  --   ALBUMIN 2.9* 2.5* 2.2*   Recent Labs  Lab 01/23/21 1356  LIPASE 36   No results for input(s): AMMONIA in the last 168 hours. Diabetic: No results for  input(s): HGBA1C in the last 72 hours. No results for input(s): GLUCAP in the last 168 hours. Cardiac Enzymes: Recent Labs  Lab 01/24/21 1245  CKTOTAL 26*   No results for input(s): PROBNP in the last 8760 hours. Coagulation Profile: Recent Labs  Lab 01/23/21 1356  INR 1.1   Thyroid Function Tests: No results for input(s): TSH, T4TOTAL, FREET4, T3FREE, THYROIDAB in the last 72 hours. Lipid Profile: No results for input(s): CHOL, HDL, LDLCALC, TRIG, CHOLHDL, LDLDIRECT in the last 72 hours. Anemia Panel: Recent Labs    01/25/21 0635  VITAMINB12 212  FOLATE 9.3  FERRITIN 101  TIBC 225*  IRON 10*  RETICCTPCT <0.4*   Urine analysis:    Component Value Date/Time   COLORURINE YELLOW 01/23/2021 1356   APPEARANCEUR HAZY (A) 01/23/2021 1356   LABSPEC 1.010 01/23/2021 1356   PHURINE 6.5 01/23/2021 1356   GLUCOSEU NEGATIVE 01/23/2021 1356   GLUCOSEU NEG mg/dL 06/28/2008 2142   HGBUR TRACE (A) 01/23/2021 1356   BILIRUBINUR NEGATIVE 01/23/2021 1356   KETONESUR NEGATIVE 01/23/2021 1356   PROTEINUR 30 (A) 01/23/2021 1356   UROBILINOGEN 1.0 08/12/2014 2350   NITRITE POSITIVE (A) 01/23/2021 1356   LEUKOCYTESUR LARGE (A) 01/23/2021 1356   Sepsis Labs: Invalid input(s): PROCALCITONIN, [place]  Microbiology: Recent Results (from the past 240 hour(s))  Resp Panel by RT-PCR (Flu A&B, Covid) Nasopharyngeal Swab     Status: None   Collection Time: 01/23/21  2:00 PM   Specimen: Nasopharyngeal Swab; Nasopharyngeal(NP) swabs in vial transport medium  Result Value Ref Range Status   SARS Coronavirus 2 by RT PCR NEGATIVE NEGATIVE Final    Comment: (NOTE) SARS-CoV-2 target nucleic acids are NOT DETECTED.  The SARS-CoV-2 RNA is generally detectable in upper respiratory specimens during the acute phase of infection. The lowest concentration of SARS-CoV-2 viral copies this assay can detect is 138 copies/mL. A negative result does not preclude SARS-Cov-2 infection and should not be  used as the sole basis for treatment or other patient management decisions. A negative result may occur with  improper specimen collection/handling, submission of specimen other than nasopharyngeal swab, presence of viral mutation(s) within the areas targeted by this assay, and inadequate number of viral copies(<138 copies/mL). A negative result must be combined with clinical observations, patient history, and epidemiological information. The expected result is Negative.  Fact Sheet for Patients:  EntrepreneurPulse.com.au  Fact Sheet for Healthcare Providers:  IncredibleEmployment.be  This test is no t yet approved or cleared by the [place] FDA and  has been authorized for detection and/or diagnosis of SARS-CoV-2 by FDA under an Emergency Use Authorization (EUA). This EUA will remain  in effect (meaning this test can be used) for the duration of the COVID-19 declaration under Section 564(b)(1) of the Act, 21 U.S.C.section 360bbb-3(b)(1), unless the authorization is terminated  or revoked sooner.       Influenza A by PCR NEGATIVE NEGATIVE Final   Influenza B by PCR NEGATIVE NEGATIVE Final    Comment: (NOTE) The Xpert Xpress SARS-CoV-2/FLU/RSV plus assay is intended as an aid in the diagnosis of influenza from Nasopharyngeal swab specimens and should not be used as a sole basis for treatment. Nasal washings and aspirates are unacceptable for Xpert Xpress SARS-CoV-2/FLU/RSV testing.  Fact Sheet for Patients: EntrepreneurPulse.com.au  Fact Sheet for Healthcare Providers: IncredibleEmployment.be  This test is not yet approved or cleared by the [place] FDA and has been authorized for detection and/or diagnosis of SARS-CoV-2 by FDA under an Emergency Use Authorization (EUA). This EUA will remain in effect (meaning this test can be used) for the duration of the COVID-19 declaration under Section  564(b)(1) of the Act, 21 U.S.C. section 360bbb-3(b)(1), unless the authorization is terminated or revoked.  Performed at [facility], [address], [place], [place] [address]     Radiology Studies: No results found.    Carmine Carrozza T. [place]  If 7PM-7AM, please contact night-coverage [email or web address] 01/25/2021, 4:55 PM

## 2021-01-25 NOTE — Evaluation (Signed)
Occupational Therapy Evaluation Patient Details Name: Beverly [name] MRN: [id number] DOB: 01-02-1981 Today's Date: 01/25/2021    History of Present Illness Beverly [name] is a 40 y.o. female with history of AIDS. Admitted for vomiting and diarrhea concerning for colitis.   Clinical Impression   Beverly [name] is a 40 year old woman who reports a history of falling due to transient weakness and neuropathy in bilateral feet as well as numbness in fingers. On evaluation she demonstrates normal ROM and strength of upper extremities and demonstrates ability to ambulate in room independently and perform ADLs. Patient and therapist discussed compensatory strategies for patient at work - including using a stool to sit on and actually taking her 30 min lunch to rest (she reports she typically doesn't). Recommended that before standing and taking off patient make sure her legs are strong and steady - but also reports recent urinary urgency forcing her to move quickly. Therapist recommended use of pads. Patient has no further OT needs.    Follow Up Recommendations  No OT follow up    Equipment Recommendations  None recommended by OT    Recommendations for Other Services       Precautions / Restrictions Precautions Precautions: None Restrictions Weight Bearing Restrictions: No      Mobility Bed Mobility Overal bed mobility: Independent                  Transfers Overall transfer level: Independent                    Balance Overall balance assessment: History of Falls (Reports hx of frequent falling at home. Reports she gets numbness in her feet and ankle from neuropathy but that it comes and goes.)                                         ADL either performed or assessed with clinical judgement   ADL Overall ADL's : Independent                                             Vision   Vision Assessment?: No apparent visual  deficits     Perception     Praxis      Pertinent Vitals/Pain Pain Assessment: No/denies pain     Hand Dominance     Extremity/Trunk Assessment Upper Extremity Assessment Upper Extremity Assessment: RUE deficits/detail;LUE deficits/detail RUE Deficits / Details: WNL ROM, 5/5 strength RUE Sensation: history of peripheral neuropathy (reports transient numbness/ tingling in fingers) RUE Coordination: WNL LUE Deficits / Details: WNL ROm, 5/5 strength LUE Sensation: history of peripheral neuropathy (reports transient numbness/tinging  in fingers)   Lower Extremity Assessment Lower Extremity Assessment: Defer to PT evaluation   Cervical / Trunk Assessment Cervical / Trunk Assessment: Normal   Communication Communication Communication: No difficulties   Cognition Arousal/Alertness: Awake/alert Behavior During Therapy: WFL for tasks assessed/performed Overall Cognitive Status: Within Functional Limits for tasks assessed                                     General Comments       Exercises     Shoulder Instructions  Home Living Family/patient expects to be discharged to:: Private residence Living Arrangements: Other relatives (brother) Available Help at Discharge: Family;Available PRN/intermittently Type of Home: Apartment Home Access: Stairs to enter Entrance Stairs-Number of Steps: 2 flights   Home Layout: One level     Bathroom Shower/Tub: Advertising copywriter: No              Prior Functioning/Environment Level of Independence: Independent        Comments: Works at [employer]        OT Problem List: Impaired sensation      OT Treatment/Interventions:      OT Goals(Current goals can be found in the care plan section) Acute Rehab OT Goals OT Goal Formulation: All assessment and education complete, DC therapy  OT Frequency:     Barriers to D/C:            Co-evaluation               AM-PAC OT "6 Clicks" Daily Activity     Outcome Measure Help from another person eating meals?: None Help from another person taking care of personal grooming?: None Help from another person toileting, which includes using toliet, bedpan, or urinal?: None Help from another person bathing (including washing, rinsing, drying)?: None Help from another person to put on and taking off regular upper body clothing?: None Help from another person to put on and taking off regular lower body clothing?: None 6 Click Score: 24   End of Session Nurse Communication: Mobility status  Activity Tolerance: Patient tolerated treatment well Patient left: in bed;with call bell/phone within reach  OT Visit Diagnosis: Muscle weakness (generalized) (M62.81)                Time: [id number] OT Time Calculation (min): 26 min Charges:  OT General Charges $OT Visit: 1 Visit OT Evaluation $OT Eval Low Complexity: 1 Low  Fantasia Jinkins, OTR/L [place]  Office [phone] Pager: [place] 01/25/2021, 12:46 PM

## 2021-01-25 NOTE — Progress Notes (Signed)
[facility] Infectious Diseases Follow Up Note  Patient Identification: Patient Name: Beverly [name] MRN: [id number] [place] Date: 01/23/2021 12:13 PM Age: 40 y.o.Today's Date: 01/25/2021   Reason for Visit: Nausea/vomiting and diarrhea in AIDs patient   Principal Problem:   Vomiting and diarrhea Active Problems:   AIDS (acquired immune deficiency syndrome) (HCC)   Anemia   Hypokalemia   Homeless   Hypomagnesemia   Nausea vomiting and diarrhea   Antibiotics: ceftriaxone 8/4-c  Lines/Tubes: PIV   Interval Events: low grade fever 100.6 yesterday, otherwise no other episodes of fevers, WBC 3.4    Assessment Nausea/vomiting/Diarrhea: h/o marijuana use  - On my evaluation today, she was having her lunch without any discomfort. Nausea.vomiting seems to have resolved. In regards to her diarrhea, RN told me she has not had BM for 2 days and he has not been able to collect stool for C diff. However, patient tells me she had 2 episodes of watery BM yesterday and 1 watery BM this morning. I advised her to inform RN when she had a BM so that stool can be collected. I am very doubtful about her diarrhea.    2.  Advanced HIV/AIDS: 08/27/20 HIV RNA 5,591;  01/23/21 cd4 <35, 1%  3.  LSIL cervix 4.  Lower abdominal wall skin thickening: No signs of cellulitis on exam  5.  Inguinal lymph nodes, likely reactive per recent CT abdomen/pelvis  6. Chronic skin lesions in bilateral LE  Recommendations Continue to hold off on antibiotics Follow up C diff/GI PCR panel and monitor closely if she is actually having diarrhea. Discussed with patient/RN to inform RN with each episodes of BM Isolation precautions per IP Fu HIV RNA, Defer start of ART per Dr Alcario Drought plan Continue Bactrim ppx  Will get AFB blood cultures   Plan discussed with patient/RN and Primary  Rest of the management as per the primary team. Thank you for the consult. Please page  with pertinent questions or concerns. ______________________________________________________________________ Subjective patient seen and examined at the bedside. There was a female person in the room which she tells is her" boss". She says nausea and vomiting present prior to admission has resolved. She has 2 episodes of watery BM yesterday and 1 today. RN says she had no BM for 2 days.   Vitals BP 114/70   Pulse (!) 108   Temp 99.8 F (37.7 C) (Oral)   Resp 18   Ht '5\' 1"'$  (1.549 m)   Wt 50.5 kg   LMP 01/23/2021 Comment: negative Hcg pregnancy test 01-23-2021  SpO2 100%   BMI 21.05 kg/m     Physical Exam Constitutional:  comfortable, not in acute distress, eating lunch     Comments:   Cardiovascular:     Rate and Rhythm: Normal rate and regular rhythm.     Heart sounds:   Pulmonary:     Effort: Pulmonary effort is normal.     Comments: clear lung fields bilaterally   Abdominal:     Palpations: Abdomen is soft.     Tenderness: Non tender and non distended   Musculoskeletal:        General: No swelling or tenderness.   Skin:    Comments:      Neurological:     General: No focal deficit present.   Psychiatric:        Mood and Affect: Mood normal.   Pertinent Microbiology Results for orders placed or performed during the hospital encounter of 01/23/21  Resp Panel  by RT-PCR (Flu A&B, Covid) Nasopharyngeal Swab     Status: None   Collection Time: 01/23/21  2:00 PM   Specimen: Nasopharyngeal Swab; Nasopharyngeal(NP) swabs in vial transport medium  Result Value Ref Range Status   SARS Coronavirus 2 by RT PCR NEGATIVE NEGATIVE Final    Comment: (NOTE) SARS-CoV-2 target nucleic acids are NOT DETECTED.  The SARS-CoV-2 RNA is generally detectable in upper respiratory specimens during the acute phase of infection. The lowest concentration of SARS-CoV-2 viral copies this assay can detect is 138 copies/mL. A negative result does not preclude SARS-Cov-2 infection and should  not be used as the sole basis for treatment or other patient management decisions. A negative result may occur with  improper specimen collection/handling, submission of specimen other than nasopharyngeal swab, presence of viral mutation(s) within the areas targeted by this assay, and inadequate number of viral copies(<138 copies/mL). A negative result must be combined with clinical observations, patient history, and epidemiological information. The expected result is Negative.  Fact Sheet for Patients:  EntrepreneurPulse.com.au  Fact Sheet for Healthcare Providers:  IncredibleEmployment.be  This test is no t yet approved or cleared by the [place] FDA and  has been authorized for detection and/or diagnosis of SARS-CoV-2 by FDA under an Emergency Use Authorization (EUA). This EUA will remain  in effect (meaning this test can be used) for the duration of the COVID-19 declaration under Section 564(b)(1) of the Act, 21 U.S.C.section 360bbb-3(b)(1), unless the authorization is terminated  or revoked sooner.       Influenza A by PCR NEGATIVE NEGATIVE Final   Influenza B by PCR NEGATIVE NEGATIVE Final    Comment: (NOTE) The Xpert Xpress SARS-CoV-2/FLU/RSV plus assay is intended as an aid in the diagnosis of influenza from Nasopharyngeal swab specimens and should not be used as a sole basis for treatment. Nasal washings and aspirates are unacceptable for Xpert Xpress SARS-CoV-2/FLU/RSV testing.  Fact Sheet for Patients: EntrepreneurPulse.com.au  Fact Sheet for Healthcare Providers: IncredibleEmployment.be  This test is not yet approved or cleared by the [place] FDA and has been authorized for detection and/or diagnosis of SARS-CoV-2 by FDA under an Emergency Use Authorization (EUA). This EUA will remain in effect (meaning this test can be used) for the duration of the COVID-19 declaration under  Section 564(b)(1) of the Act, 21 U.S.C. section 360bbb-3(b)(1), unless the authorization is terminated or revoked.  Performed at [facility], [address] [place], [place] [address]     Pertinent Lab. CBC Latest Ref Rng & Units 01/25/2021 01/24/2021 01/23/2021  WBC 4.0 - 10.5 K/uL 3.4(L) 4.0 4.1  Hemoglobin 12.0 - 15.0 g/dL 7.9(L) 8.2(L) 8.3(L)  Hematocrit 36.0 - 46.0 % 25.9(L) 26.6(L) 27.4(L)  Platelets 150 - 400 K/uL 228 226 229   CMP Latest Ref Rng & Units 01/25/2021 01/24/2021 01/23/2021  Glucose 70 - 99 mg/dL 83 91 131(H)  BUN 6 - 20 mg/dL '11 13 13  '$ Creatinine 0.44 - 1.00 mg/dL 0.59 0.80 0.75  Sodium 135 - 145 mmol/L 133(L) 136 131(L)  Potassium 3.5 - 5.1 mmol/L 3.8 3.5 3.3(L)  Chloride 98 - 111 mmol/L 106 109 105  CO2 22 - 32 mmol/L 23 21(L) 21(L)  Calcium 8.9 - 10.3 mg/dL 8.0(L) 8.5(L) 8.0(L)  Total Protein 6.5 - 8.1 g/dL - 7.6 -  Total Bilirubin 0.3 - 1.2 mg/dL - 0.4 -  Alkaline Phos 38 - 126 U/L - 73 -  AST 15 - 41 U/L - 17 -  ALT 0 -  44 U/L - 11 -     Pertinent Imaging today Plain films and CT images have been personally visualized and interpreted; radiology reports have been reviewed. Decision making incorporated into the Impression / Recommendations.  CT abdomen/pelvis  FINDINGS: Lower chest: No acute abnormality.   Hepatobiliary: No focal liver abnormality is seen. No gallstones, gallbladder wall thickening, or biliary dilatation.   Pancreas: Unremarkable. No pancreatic ductal dilatation or surrounding inflammatory changes.   Spleen: Normal in size without focal abnormality.   Adrenals/Urinary Tract:   *Adrenal glands are unremarkable. *Multiple, punctate non-obstructing calculi within the renal collecting systems. The largest measures up to 4 mm, and is located within the midpolar right kidney. *No hydronephrosis *Subcentimeter right hypodense renal lesion, too small for adequate CT has a likely a small cyst. *Bladder is unremarkable.    Stomach/Bowel: Stomach is within normal limits. Enteral contrast of opacification of stomach, small bowel and colon. No extraluminal contrast extravasation. Appendix appears normal. No evidence of bowel wall thickening, distention, or inflammatory changes.   Vascular/Lymphatic: No significant vascular findings are present. Similar appearance of prominent inguinal lymph nodes, likely reactive.   Reproductive: Uterus and bilateral adnexa are normal. Dominant left ovarian follicle. Pelvic phleboliths.   Other: No abdominal wall hernia.  No abdominopelvic ascites.   Musculoskeletal: Similar appearance of skin thickening at the lower abdominal (see key image). No acute or significant osseous findings.   IMPRESSION: 1. Punctate, nonobstructing renal calculi.  No hydronephrosis. 2. Lower abdominal wall skin thickening. Findings are unchanged from comparison and may represent cellulitis.    I spent more than 35 minutes for this patient encounter including review of prior medical records, coordination of care  with greater than 50% of time being face to face/counseling and discussing diagnostics/treatment plan with the patient/family.  Electronically signed by:   Rosiland Oz, MD Infectious Disease Physician [facility] for Infectious Disease Pager: [phone]

## 2021-01-26 ENCOUNTER — Encounter (HOSPITAL_COMMUNITY): Payer: Self-pay | Admitting: Pharmacist

## 2021-01-26 LAB — GASTROINTESTINAL PANEL BY PCR, STOOL (REPLACES STOOL CULTURE)

## 2021-01-26 LAB — CBC WITH DIFFERENTIAL/PLATELET
Abs Immature Granulocytes: 0.03 10*3/uL (ref 0.00–0.07)
Basophils Absolute: 0 10*3/uL (ref 0.0–0.1)
Basophils Relative: 0 %
Eosinophils Absolute: 0.2 10*3/uL (ref 0.0–0.5)
Eosinophils Relative: 9 %
HCT: 26.1 % — ABNORMAL LOW (ref 36.0–46.0)
Hemoglobin: 8.1 g/dL — ABNORMAL LOW (ref 12.0–15.0)
Immature Granulocytes: 1 %
Lymphocytes Relative: 10 %
Lymphs Abs: 0.2 10*3/uL — ABNORMAL LOW (ref 0.7–4.0)
MCH: 25.1 pg — ABNORMAL LOW (ref 26.0–34.0)
MCHC: 31 g/dL (ref 30.0–36.0)
MCV: 80.8 fL (ref 80.0–100.0)
Monocytes Absolute: 0.2 10*3/uL (ref 0.1–1.0)
Monocytes Relative: 7 %
Neutro Abs: 1.7 10*3/uL (ref 1.7–7.7)
Neutrophils Relative %: 73 %
Platelets: 233 10*3/uL (ref 150–400)
RBC: 3.23 MIL/uL — ABNORMAL LOW (ref 3.87–5.11)
RDW: 16.8 % — ABNORMAL HIGH (ref 11.5–15.5)
WBC: 2.3 10*3/uL — ABNORMAL LOW (ref 4.0–10.5)
nRBC: 0 % (ref 0.0–0.2)

## 2021-01-26 LAB — RENAL FUNCTION PANEL
Albumin: 2.1 g/dL — ABNORMAL LOW (ref 3.5–5.0)
Anion gap: 4 — ABNORMAL LOW (ref 5–15)
BUN: 13 mg/dL (ref 6–20)
CO2: 23 mmol/L (ref 22–32)
Calcium: 7.5 mg/dL — ABNORMAL LOW (ref 8.9–10.3)
Chloride: 106 mmol/L (ref 98–111)
Creatinine, Ser: 0.74 mg/dL (ref 0.44–1.00)
GFR, Estimated: >60 mL/min (ref >60)
Glucose, Bld: 89 mg/dL (ref 70–99)
Phosphorus: 4.3 mg/dL (ref 2.5–4.6)
Potassium: 3.5 mmol/L (ref 3.5–5.1)
Sodium: 133 mmol/L — ABNORMAL LOW (ref 135–145)

## 2021-01-26 LAB — MAGNESIUM: Magnesium: 1.6 mg/dL — ABNORMAL LOW (ref 1.7–2.4)

## 2021-01-26 MED ORDER — POTASSIUM CHLORIDE CRYS ER 20 MEQ PO TBCR
40.0000 meq | EXTENDED_RELEASE_TABLET | Freq: Once | ORAL | Status: AC
  Administered 2021-01-26: 40 meq via ORAL
  Filled 2021-01-26: qty 2

## 2021-01-26 MED ORDER — CALCIUM GLUCONATE-NACL 2-0.675 GM/100ML-% IV SOLN
2.0000 g | Freq: Once | INTRAVENOUS | Status: AC
  Administered 2021-01-26: 2000 mg via INTRAVENOUS
  Filled 2021-01-26: qty 100

## 2021-01-26 MED ORDER — SODIUM CHLORIDE 0.9 % IV SOLN
250.0000 mg | Freq: Every day | INTRAVENOUS | Status: DC
  Administered 2021-01-26: 250 mg via INTRAVENOUS
  Filled 2021-01-26: qty 20

## 2021-01-26 MED ORDER — DIPHENHYDRAMINE HCL 25 MG PO CAPS
25.0000 mg | ORAL_CAPSULE | Freq: Once | ORAL | Status: AC
  Administered 2021-01-26: 25 mg via ORAL
  Filled 2021-01-26: qty 1

## 2021-01-26 MED ORDER — MAGNESIUM SULFATE 2 GM/50ML IV SOLN
2.0000 g | Freq: Once | INTRAVENOUS | Status: AC
  Administered 2021-01-26: 2 g via INTRAVENOUS
  Filled 2021-01-26: qty 50

## 2021-01-26 NOTE — Progress Notes (Signed)
RN called to room as pt c/o swelling in bilateral hands. Left noted to be greater than right. Pt also states feels like feet are swelling too. On call provider notified. Orders received.

## 2021-01-26 NOTE — Progress Notes (Signed)
PROGRESS NOTE  Beverly [name] [id number] DOB: 11-13-1980   PCP: Pcp, No  Patient is from: Home.  Lives with her brother.  DOA: 01/23/2021 LOS: 3  Chief complaints:  Chief Complaint  Patient presents with   Generalized Body Aches     Brief Narrative / Interim history: 40 year old F with history of HIV noncompliant with medications, LSIL, anemia, tobacco use, marijuana use, alcohol use, anxiety and depression presenting with abdominal discomfort, nausea, vomiting and diarrhea, and admitted for the same.  She was also hypokalemic.  Started on IV fluid, potassium supplementation and antiemetics.  C. difficile, GIP and CT abdomen and pelvis ordered.  COVID-19 and influenza PCR was nonreactive.  UA concerning for UTI but no UTI symptoms.  ID consulted, ordered AFB smear and started Bactrim for PPx.  C. difficile and GI panel negative.  Urine culture with 90,000 colonies of E. Coli but patient does not have UTI symptoms.  GI symptoms seems to have resolved.    Subjective: Seen and examined earlier this morning.  No major events overnight of this morning.  Reports generalized body pain.  She denies chest pain, dyspnea, GI or UTI symptoms.  Objective: Vitals:   01/25/21 2225 01/26/21 0601 01/26/21 1345 01/26/21 1347  BP: 105/73 96/63 110/81 110/81  Pulse: 83 72 68 85  Resp: '20 18 14 14  '$ Temp: 98 F (36.7 C) 97.7 F (36.5 C) 98.1 F (36.7 C) 98.1 F (36.7 C)  TempSrc: Oral Oral Oral Oral  SpO2: 97% 100% 100% 98%  Weight:      Height:        Intake/Output Summary (Last 24 hours) at 01/26/2021 1705 Last data filed at 01/26/2021 1637 Gross per 24 hour  Intake 486.83 ml  Output --  Net 486.83 ml    Filed Weights   01/23/21 1206  Weight: 50.5 kg    Examination:  GENERAL: No apparent distress.  Nontoxic. HEENT: MMM.  Vision and hearing grossly intact.  NECK: Supple.  No apparent JVD.  RESP: On RA.  No IWOB.  Fair aeration bilaterally. CVS:  RRR. Heart sounds normal.   ABD/GI/GU: BS+. Abd soft.  Grimacing with abdominal palpation. MSK/EXT:  Moves extremities. No apparent deformity. No edema.  SKIN: Clear looking the skin lesion over suprapubic and left groin area.  No signs of active infection. NEURO: Awake and alert. Oriented appropriately.  No apparent focal neuro deficit. PSYCH: Calm. Normal affect.   Procedures:  None  Microbiology summarized: [id number] and influenza PCR nonreactive. C. difficile and GIP pending. Urine culture pending.  Assessment & Plan: Nausea, vomiting, diarrhea and abdominal pain-unclear etiology but resolved.  Work-up including CT A/P, CMP, C. Difficile and GIP unrevealing.  Lactic acid within normal. Seems to be tolerating regular diet.  She has history of marijuana use which could contribute to cannabinoid hyperemesis syndrome.   -Antiemetics and analgesics as needed -Discontinue enteric precaution  Pyuria/bacteriuria: UTI?  Her history is inconsistent.  She reported some urgency at some point but denied any UTI symptoms on subsequent interviews.  Urine culture with E. coli. -Ceftriaxone discontinued by ID. -Currently on Bactrim for PJP prophylaxis which could give her coverage  Myalgia/generalized body pain: Unclear etiology of this.  Withdrawal?.  CK within normal.  -As needed Tylenol and Norco for now  Hypokalemia/hypomagnesemia: K3.5.  Mg 1.6. -IV magnesium sulfate 2 g x 1 -K-Dur 40 mill equivalent x1  Hyponatremia: 133.  Unclear etiology.  No further drop after starting Bactrim. -Recheck in the morning  HIV/AIDS:  Not compliant. CD4 <35, and quantitative viral RNA pending (331K about 5 months ago). -ID consulted and viral RNA ordered. -Defer initiation of HIV meds to ID  Severe iron deficiency anemia: H&H relatively stable.  Anemia panel consistent with severe iron deficiency.  She denies melena or hematochezia.  H&H stable Recent Labs    07/25/20 1946 08/26/20 1155 08/28/20 0533 08/29/20 0037  08/30/20 0048 01/23/21 1356 01/23/21 2133 01/24/21 0011 01/25/21 0635 01/26/21 0610  HGB 10.1* 9.7* 8.3* 8.4* 8.3* 9.8* 8.3* 8.2* 7.9* 8.1*  -IV iron -P.o. vitamin B12 -Monitor H&H  Leukopenia/lymphocytopenia: Due to HIV? -Recheck in the morning  Positive RPR: T. pallidum antibody was negative.  Unlikely syphilis.  Marijuana use/tobacco use disorder/alcohol use-denies using any of these in the last 3 weeks -Encourage cessation.  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 -Outpatient follow-up  Skin wound/ulcer-has lower abdominal wall and left groin wound.  No signs of infection. -WOCN consulted  Body mass index is 21.05 kg/m. Nutrition Problem: Increased nutrient needs Etiology: chronic illness Signs/Symptoms: estimated needs Interventions: Boost Breeze, MVI, Magic cup   DVT prophylaxis:  enoxaparin (LOVENOX) injection 40 mg Start: 01/23/21 2200  Code Status: Full code Family Communication: Patient and/or RN. Available if any question.  Level of care: Telemetry Status is: Inpatient  Remains inpatient appropriate because:Persistent severe electrolyte disturbances and Inpatient level of care appropriate due to severity of illness  Dispo: The patient is from: Home              Anticipated d/c is to: Home once cleared by ID.              Patient currently is not medically stable to d/c.   Difficult to place patient No       Consultants:  Infectious disease   Sch Meds:  Scheduled Meds:  enoxaparin (LOVENOX) injection  40 mg Subcutaneous Q24H   feeding supplement  1 Container Oral TID BM   multivitamin with minerals  1 tablet Oral Daily   sulfamethoxazole-trimethoprim  1 tablet Oral Daily   vitamin B-12  1,000 mcg Oral Daily   Continuous Infusions:  ferric gluconate (FERRLECIT) IVPB 250 mg (01/26/21 1542)    PRN Meds:.acetaminophen **OR** acetaminophen, HYDROcodone-acetaminophen, lip balm  Antimicrobials: Anti-infectives (From admission, onward)    Start      Dose/Rate Route Frequency Ordered Stop   01/25/21 1000  sulfamethoxazole-trimethoprim (BACTRIM DS) 800-160 MG per tablet 1 tablet        1 tablet Oral Daily 01/24/21 1654     01/24/21 1400  cefTRIAXone (ROCEPHIN) 1 g in sodium chloride 0.9 % 100 mL IVPB  Status:  Discontinued        1 g 200 mL/hr over 30 Minutes Intravenous Every 24 hours 01/24/21 1239 01/24/21 1654        I have personally reviewed the following labs and images: CBC: Recent Labs  Lab 01/23/21 1356 01/23/21 2133 01/24/21 0011 01/25/21 0635 01/26/21 0610  WBC 3.8* 4.1 4.0 3.4* 2.3*  NEUTROABS 2.7  --   --   --  1.7  HGB 9.8* 8.3* 8.2* 7.9* 8.1*  HCT 31.1* 27.4* 26.6* 25.9* 26.1*  MCV 77.4* 79.9* 79.6* 79.9* 80.8  PLT 273 229 226 228 233   BMP &GFR Recent Labs  Lab 01/23/21 1356 01/23/21 2133 01/24/21 0011 01/25/21 0635 01/26/21 0610  NA 132* 131* 136 133* 133*  K 2.8* 3.3* 3.5 3.8 3.5  CL 101 105 109 106 106  CO2 22 21* 21* 23 23  GLUCOSE 89  131* 91 83 89  BUN '18 13 13 11 13  '$ CREATININE 0.86 0.75 0.80 0.59 0.74  CALCIUM 8.6* 8.0* 8.5* 8.0* 7.5*  MG 1.5* 1.7 1.7 1.5* 1.6*  PHOS 4.4  --   --  4.1 4.3   Estimated Creatinine Clearance: 71.9 mL/min (by C-G formula based on SCr of 0.74 mg/dL). Liver & Pancreas: Recent Labs  Lab 01/23/21 1356 01/24/21 0011 01/25/21 0635 01/26/21 0610  AST 21 17  --   --   ALT 13 11  --   --   ALKPHOS 87 73  --   --   BILITOT 0.4 0.4  --   --   PROT 9.1* 7.6  --   --   ALBUMIN 2.9* 2.5* 2.2* 2.1*   Recent Labs  Lab 01/23/21 1356  LIPASE 36   No results for input(s): AMMONIA in the last 168 hours. Diabetic: No results for input(s): HGBA1C in the last 72 hours. No results for input(s): GLUCAP in the last 168 hours. Cardiac Enzymes: Recent Labs  Lab 01/24/21 1245  CKTOTAL 26*   No results for input(s): PROBNP in the last 8760 hours. Coagulation Profile: Recent Labs  Lab 01/23/21 1356  INR 1.1   Thyroid Function Tests: No results for input(s):  TSH, T4TOTAL, FREET4, T3FREE, THYROIDAB in the last 72 hours. Lipid Profile: No results for input(s): CHOL, HDL, LDLCALC, TRIG, CHOLHDL, LDLDIRECT in the last 72 hours. Anemia Panel: Recent Labs    01/25/21 0635  VITAMINB12 212  FOLATE 9.3  FERRITIN 101  TIBC 225*  IRON 10*  RETICCTPCT <0.4*   Urine analysis:    Component Value Date/Time   COLORURINE YELLOW 01/23/2021 1356   APPEARANCEUR HAZY (A) 01/23/2021 1356   LABSPEC 1.010 01/23/2021 1356   PHURINE 6.5 01/23/2021 1356   GLUCOSEU NEGATIVE 01/23/2021 1356   GLUCOSEU NEG mg/dL 06/28/2008 2142   HGBUR TRACE (A) 01/23/2021 1356   BILIRUBINUR NEGATIVE 01/23/2021 1356   KETONESUR NEGATIVE 01/23/2021 1356   PROTEINUR 30 (A) 01/23/2021 1356   UROBILINOGEN 1.0 08/12/2014 2350   NITRITE POSITIVE (A) 01/23/2021 1356   LEUKOCYTESUR LARGE (A) 01/23/2021 1356   Sepsis Labs: Invalid input(s): PROCALCITONIN, [place]  Microbiology: Recent Results (from the past 240 hour(s))  Resp Panel by RT-PCR (Flu A&B, Covid) Nasopharyngeal Swab     Status: None   Collection Time: 01/23/21  2:00 PM   Specimen: Nasopharyngeal Swab; Nasopharyngeal(NP) swabs in vial transport medium  Result Value Ref Range Status   SARS Coronavirus 2 by RT PCR NEGATIVE NEGATIVE Final    Comment: (NOTE) SARS-CoV-2 target nucleic acids are NOT DETECTED.  The SARS-CoV-2 RNA is generally detectable in upper respiratory specimens during the acute phase of infection. The lowest concentration of SARS-CoV-2 viral copies this assay can detect is 138 copies/mL. A negative result does not preclude SARS-Cov-2 infection and should not be used as the sole basis for treatment or other patient management decisions. A negative result may occur with  improper specimen collection/handling, submission of specimen other than nasopharyngeal swab, presence of viral mutation(s) within the areas targeted by this assay, and inadequate number of viral copies(<138 copies/mL). A  negative result must be combined with clinical observations, patient history, and epidemiological information. The expected result is Negative.  Fact Sheet for Patients:  EntrepreneurPulse.com.au  Fact Sheet for Healthcare Providers:  IncredibleEmployment.be  This test is no t yet approved or cleared by the [place] FDA and  has been authorized for detection and/or diagnosis of SARS-CoV-2 by FDA  under an Emergency Use Authorization (EUA). This EUA will remain  in effect (meaning this test can be used) for the duration of the COVID-19 declaration under Section 564(b)(1) of the Act, 21 U.S.C.section 360bbb-3(b)(1), unless the authorization is terminated  or revoked sooner.       Influenza A by PCR NEGATIVE NEGATIVE Final   Influenza B by PCR NEGATIVE NEGATIVE Final    Comment: (NOTE) The Xpert Xpress SARS-CoV-2/FLU/RSV plus assay is intended as an aid in the diagnosis of influenza from Nasopharyngeal swab specimens and should not be used as a sole basis for treatment. Nasal washings and aspirates are unacceptable for Xpert Xpress SARS-CoV-2/FLU/RSV testing.  Fact Sheet for Patients: EntrepreneurPulse.com.au  Fact Sheet for Healthcare Providers: IncredibleEmployment.be  This test is not yet approved or cleared by the [place] FDA and has been authorized for detection and/or diagnosis of SARS-CoV-2 by FDA under an Emergency Use Authorization (EUA). This EUA will remain in effect (meaning this test can be used) for the duration of the COVID-19 declaration under Section 564(b)(1) of the Act, 21 U.S.C. section 360bbb-3(b)(1), unless the authorization is terminated or revoked.  Performed at [facility], [address] [place], [place] [address]   Urine Culture     Status: Abnormal (Preliminary result)   Collection Time: 01/24/21  8:03 AM   Specimen: Urine, Clean Catch  Result Value  Ref Range Status   Specimen Description   Final    URINE, CLEAN CATCH Performed at [facility], [place] [address], [place], [place] [address]    Special Requests   Final    NONE Performed at [facility], [place] [address], [place], [place] [address]    Culture 90,000 COLONIES/mL ESCHERICHIA COLI (A)  Final   Report Status PENDING  Incomplete  Gastrointestinal Panel by PCR , Stool     Status: None   Collection Time: 01/25/21  7:50 PM   Specimen: Stool  Result Value Ref Range Status   Campylobacter species NOT DETECTED NOT DETECTED Final   Plesimonas shigelloides NOT DETECTED NOT DETECTED Final   Salmonella species NOT DETECTED NOT DETECTED Final   Yersinia enterocolitica NOT DETECTED NOT DETECTED Final   Vibrio species NOT DETECTED NOT DETECTED Final   Vibrio cholerae NOT DETECTED NOT DETECTED Final   Enteroaggregative E coli (EAEC) NOT DETECTED NOT DETECTED Final   Enteropathogenic E coli (EPEC) NOT DETECTED NOT DETECTED Final   Enterotoxigenic E coli (ETEC) NOT DETECTED NOT DETECTED Final   Shiga like toxin producing E coli (STEC) NOT DETECTED NOT DETECTED Final   Shigella/Enteroinvasive E coli (EIEC) NOT DETECTED NOT DETECTED Final   Cryptosporidium NOT DETECTED NOT DETECTED Final   Cyclospora cayetanensis NOT DETECTED NOT DETECTED Final   Entamoeba histolytica NOT DETECTED NOT DETECTED Final   Giardia lamblia NOT DETECTED NOT DETECTED Final   Adenovirus F40/41 NOT DETECTED NOT DETECTED Final   Astrovirus NOT DETECTED NOT DETECTED Final   Norovirus GI/GII NOT DETECTED NOT DETECTED Final   Rotavirus A NOT DETECTED NOT DETECTED Final   Sapovirus (I, II, IV, and V) NOT DETECTED NOT DETECTED Final    Comment: Performed at [facility], [address] [place], [place] [address]  C Difficile Quick Screen w PCR reflex     Status: None   Collection Time: 01/25/21  7:51 PM   Specimen: STOOL  Result Value Ref Range Status   C Diff antigen  NEGATIVE NEGATIVE Final   C Diff toxin NEGATIVE  NEGATIVE Final   C Diff interpretation NEGATIVE  Final    Comment: Performed at [place] [facility] [address] [place], [place] [address]    Radiology Studies: No results found.    Jasiyah Paulding T. [place]  If 7PM-7AM, please contact night-coverage [email or web address] 01/26/2021, 5:05 PM

## 2021-01-27 DIAGNOSIS — R197 Diarrhea, unspecified: Secondary | ICD-10-CM | POA: Diagnosis not present

## 2021-01-27 DIAGNOSIS — D500 Iron deficiency anemia secondary to blood loss (chronic): Secondary | ICD-10-CM

## 2021-01-27 DIAGNOSIS — R8271 Bacteriuria: Secondary | ICD-10-CM

## 2021-01-27 DIAGNOSIS — R112 Nausea with vomiting, unspecified: Secondary | ICD-10-CM | POA: Diagnosis not present

## 2021-01-27 DIAGNOSIS — D72810 Lymphocytopenia: Secondary | ICD-10-CM

## 2021-01-27 DIAGNOSIS — B20 Human immunodeficiency virus [HIV] disease: Secondary | ICD-10-CM | POA: Diagnosis not present

## 2021-01-27 DIAGNOSIS — R1084 Generalized abdominal pain: Secondary | ICD-10-CM

## 2021-01-27 LAB — RENAL FUNCTION PANEL
Albumin: 2 g/dL — ABNORMAL LOW (ref 3.5–5.0)
Anion gap: 5 (ref 5–15)
BUN: 15 mg/dL (ref 6–20)
CO2: 23 mmol/L (ref 22–32)
Calcium: 7.8 mg/dL — ABNORMAL LOW (ref 8.9–10.3)
Chloride: 106 mmol/L (ref 98–111)
Creatinine, Ser: 0.7 mg/dL (ref 0.44–1.00)
GFR, Estimated: >60 mL/min (ref >60)
Glucose, Bld: 91 mg/dL (ref 70–99)
Phosphorus: 4.5 mg/dL (ref 2.5–4.6)
Potassium: 4.2 mmol/L (ref 3.5–5.1)
Sodium: 134 mmol/L — ABNORMAL LOW (ref 135–145)

## 2021-01-27 LAB — CBC
HCT: 27.6 % — ABNORMAL LOW (ref 36.0–46.0)
Hemoglobin: 8.5 g/dL — ABNORMAL LOW (ref 12.0–15.0)
MCH: 25.1 pg — ABNORMAL LOW (ref 26.0–34.0)
MCHC: 30.8 g/dL (ref 30.0–36.0)
MCV: 81.7 fL (ref 80.0–100.0)
Platelets: 244 10*3/uL (ref 150–400)
RBC: 3.38 MIL/uL — ABNORMAL LOW (ref 3.87–5.11)
RDW: 17 % — ABNORMAL HIGH (ref 11.5–15.5)
WBC: 2.7 10*3/uL — ABNORMAL LOW (ref 4.0–10.5)
nRBC: 0 % (ref 0.0–0.2)

## 2021-01-27 LAB — HIV-1 RNA QUANT-NO REFLEX-BLD
HIV 1 RNA Quant: 101000 copies/mL
LOG10 HIV-1 RNA: 5.004 log10copy/mL

## 2021-01-27 LAB — URINE CULTURE: Culture: 90000 — AB

## 2021-01-27 LAB — MAGNESIUM: Magnesium: 1.5 mg/dL — ABNORMAL LOW (ref 1.7–2.4)

## 2021-01-27 MED ORDER — BICTEGRAVIR-EMTRICITAB-TENOFOV 50-200-25 MG PO TABS: 1.0000 | ORAL_TABLET | Freq: Every day | ORAL | 3 refills | Status: DC

## 2021-01-27 MED ORDER — SENNOSIDES-DOCUSATE SODIUM 8.6-50 MG PO TABS: 1.0000 | ORAL_TABLET | Freq: Two times a day (BID) | ORAL | 0 refills | Status: DC | PRN

## 2021-01-27 MED ORDER — SULFAMETHOXAZOLE-TRIMETHOPRIM 800-160 MG PO TABS
1.0000 | ORAL_TABLET | ORAL | Status: DC
  Administered 2021-01-27: 1 via ORAL
  Filled 2021-01-27: qty 1

## 2021-01-27 MED ORDER — CYANOCOBALAMIN 1000 MCG PO TABS: 1000.0000 ug | ORAL_TABLET | Freq: Every day | ORAL | Status: DC

## 2021-01-27 MED ORDER — SULFAMETHOXAZOLE-TRIMETHOPRIM 800-160 MG PO TABS: 1.0000 | ORAL_TABLET | ORAL | 3 refills | Status: DC

## 2021-01-27 MED ORDER — FERROUS SULFATE 325 (65 FE) MG PO TBEC: 325.0000 mg | DELAYED_RELEASE_TABLET | Freq: Two times a day (BID) | ORAL | 1 refills | Status: DC

## 2021-01-27 MED ORDER — MAGNESIUM SULFATE 4 GM/100ML IV SOLN
4.0000 g | Freq: Once | INTRAVENOUS | Status: AC
  Administered 2021-01-27: 4 g via INTRAVENOUS
  Filled 2021-01-27 (×2): qty 100

## 2021-01-27 NOTE — Progress Notes (Signed)
[facility] Infectious Disease  Date of Admission:  01/23/2021           Reason for visit: Follow up on HIV   ASSESSMENT:    Nausea, vomiting, diarrhea: The symptoms have resolved during her admission without any targeted treatment.  Her C. difficile and GI pathogen panel were both negative. Advanced HIV: CD4 count less than 35 and viral load pending Lymphadenopathy: Likely reactive per recent CT abdomen/pelvis LSIL cervix Lower abdominal wall skin thickening: No signs of cellulitis on exam Chronic skin lesions in bilateral lower extremities  PLAN:    Continue Bactrim for PCP prophylaxis Patient is quite decisive in her desire to resume ART.  She reports that she will be able to have better adherence while she is living with her brother.  We discussed the concerns for intermittent adherence and the risk for developing viral resistance in this setting.  She reinforces that she will be adherent.  As a result, we will resume Biktarvy and send to her pharmacy at discharge. Follow-up with Janene Madeira made for 8/23 Will sign off for now, please call as needed   Principal Problem:   Vomiting and diarrhea Active Problems:   AIDS (acquired immune deficiency syndrome) (HCC)   Anemia   Hypokalemia   Homeless   Hypomagnesemia   Nausea vomiting and diarrhea    MEDICATIONS:    Scheduled Meds:  enoxaparin (LOVENOX) injection  40 mg Subcutaneous Q24H   feeding supplement  1 Container Oral TID BM   multivitamin with minerals  1 tablet Oral Daily   sulfamethoxazole-trimethoprim  1 tablet Oral Once per day on Mon Wed Fri   vitamin B-12  1,000 mcg Oral Daily   Continuous Infusions: PRN Meds:.acetaminophen **OR** acetaminophen, HYDROcodone-acetaminophen, lip balm  SUBJECTIVE:   24 hour events:  No acute events noted overnight [name] C. difficile and GI panel was negative No new imaging today Afebrile T-max 98.2  Patient is sitting up in bed eating breakfast.   The plan is for her to be discharged today.  She has no further fevers or chills.  Her symptoms of nausea and vomiting have resolved.  She does not report any diarrhea today.  She says that she will be discharging to her brother's house where she feels comfortable taking her medication and does not have any concerns regarding her adherence.  We discussed resuming her ART and she would like to do so.    Review of Systems  All other systems reviewed and are negative.    OBJECTIVE:   Blood pressure 92/73, pulse 70, temperature 98.2 F (36.8 C), temperature source Oral, resp. rate 20, height '5\' 1"'$  (1.549 m), weight 50.5 kg, last menstrual period 01/23/2021, SpO2 100 %. Body mass index is 21.05 kg/m.  Physical Exam Constitutional:      General: She is not in acute distress.    Appearance: Normal appearance.  HENT:     Head: Normocephalic and atraumatic.  Eyes:     Extraocular Movements: Extraocular movements intact.     Conjunctiva/sclera: Conjunctivae normal.  Pulmonary:     Effort: Pulmonary effort is normal. No respiratory distress.  Abdominal:     General: There is no distension.     Palpations: Abdomen is soft.     Tenderness: There is no abdominal tenderness.  Musculoskeletal:        General: Normal range of motion.     Cervical back: Normal range of motion and neck supple.  Skin:  General: Skin is warm and dry.  Neurological:     General: No focal deficit present.     Mental Status: She is alert and oriented to person, place, and time.  Psychiatric:        Mood and Affect: Mood normal.        Behavior: Behavior normal.     Lab Results: Lab Results  Component Value Date   WBC 2.7 (L) 01/27/2021   HGB 8.5 (L) 01/27/2021   HCT 27.6 (L) 01/27/2021   MCV 81.7 01/27/2021   PLT 244 01/27/2021    Lab Results  Component Value Date   NA 134 (L) 01/27/2021   K 4.2 01/27/2021   CO2 23 01/27/2021   GLUCOSE 91 01/27/2021   BUN 15 01/27/2021   CREATININE 0.70  01/27/2021   CALCIUM 7.8 (L) 01/27/2021   GFRNONAA >60 01/27/2021   GFRAA 132 12/26/2019    Lab Results  Component Value Date   ALT 11 01/24/2021   AST 17 01/24/2021   ALKPHOS 73 01/24/2021   BILITOT 0.4 01/24/2021    No results found for: CRP  No results found for: ESRSEDRATE   I have reviewed the micro and lab results in Epic.  Imaging: No results found.   Imaging independently reviewed in Epic.    Beverly [name] for Infectious Disease [facility] [phone] pager 01/27/2021, 12:23 PM

## 2021-01-27 NOTE — Discharge Summary (Signed)
Physician Discharge Summary  Beverly [name] [id number] DOB: 1980/09/27 DOA: 01/23/2021  PCP: Pcp, No  Admit date: 01/23/2021 Discharge date: 01/27/2021  Admitted From: Home Disposition: Home  Recommendations for Outpatient Follow-up:  Follow ups as below. Please obtain CBC/BMP/Mag at follow up Please follow up on the following pending results: Viral RNA  Home Health: None indicated Equipment/Devices: Not indicated  Discharge Condition: Stable CODE STATUS: Full code   Follow-up Information     Comer, Okey Regal, MD Follow up.   Specialty: Infectious Diseases Contact information: [address] [place] [address] [phone]                Hospital Course: 40 year old F with history of HIV noncompliant with medications, LSIL, anemia, tobacco use, marijuana use, alcohol use, anxiety and depression presenting with abdominal discomfort, nausea, vomiting and diarrhea, and admitted for the same.  She was also hypokalemic.  Started on IV fluid, potassium supplementation and antiemetics.  C. difficile, GIP and CT abdomen and pelvis ordered.  COVID-19 and influenza PCR was nonreactive.  UA concerning for UTI but no UTI symptoms.  ID consulted, ordered AFB smear, viral RNA and started Bactrim for PPx.  C. difficile and GI panel negative.  Urine culture with 90,000 colonies of E. Coli but patient does not have UTI symptoms.  GI symptoms resolved.  ID restarted Biktarvy, and cleared patient for discharge.  Outpatient follow-up with ID as above  See individual for med list below for more on hospital course.  Discharge Diagnoses:  Nausea, vomiting, diarrhea and abdominal pain-work-up including CT A/P, CMP, C. Difficile and GIP unrevealing, but the GI symptoms resolved.  Lactic acid within normal.  Tolerating regular diet.  -Advised to stop marijuana   Pyuria/bacteriuria: No UTI symptoms.  ID discontinue ceftriaxone after 1 dose. -On Bactrim for PJP prophylaxis which  could give her coverage   Myalgia/generalized body pain: Unclear etiology of this.  CK within normal. -Tylenol as needed.   Hypokalemia/hypomagnesemia: Hypokalemia resolved.  Magnesium replenished prior to discharge.   Hyponatremia: 133.  Unclear etiology.  No further drop after starting Bactrim. -Recheck at follow-up   HIV/AIDS: Not compliant. CD4 <35, and quantitative viral RNA pending (331K about 5 months ago). -AFB and viral RNA pending. -ID started Biktarvy -Bactrim for PJP and toxo prophylaxis   Severe iron deficiency anemia: H&H relatively stable.  Anemia panel consistent with severe iron deficiency.  She denies melena or hematochezia.  H&H stable after initial drop. Recent Labs    08/26/20 1155 08/28/20 0533 08/29/20 0037 08/30/20 0048 01/23/21 1356 01/23/21 2133 01/24/21 0011 01/25/21 0635 01/26/21 0610 01/27/21 0508  HGB 9.7* 8.3* 8.4* 8.3* 9.8* 8.3* 8.2* 7.9* 8.1* 8.5*  -Did not tolerate IV iron. -Discharged on p.o. iron and vitamin B12 -Recheck CBC at follow-up   Leukopenia/lymphocytopenia: Likely due to HIV.   Positive RPR: T. pallidum antibody was negative.  Unlikely syphilis.   Marijuana use/tobacco use disorder/alcohol use-denies using any of these in the last 3 weeks -Encourage cessation.   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 -Advised to follow-up with Dr. Sabra Heck for recommendation by PCP   Skin wound/ulcer-has lower abdominal wall and left groin wound.  No signs of infection. -See daily dressing instruction below   Increased nutritional needs Body mass index is 21.05 kg/m. Nutrition Problem: Increased nutrient needs Etiology: chronic illness Signs/Symptoms: estimated needs Interventions: Boost Breeze, MVI, Magic cup      Discharge Exam: Vitals:   01/26/21 2214 01/27/21 0516  BP: 124/81 92/73  Pulse: 82 70  Resp: 18 20  Temp:  98.2 F (36.8 C)  SpO2: 100% 100%    GENERAL: No apparent distress.  Nontoxic. HEENT: MMM.   Vision and hearing grossly intact.  NECK: Supple.  No apparent JVD.  RESP: On RA.  No IWOB.  Fair aeration bilaterally. CVS:  RRR. Heart sounds normal.  ABD/GI/GU: Bowel sounds present. Soft. Non tender.  MSK/EXT:  Moves extremities. No apparent deformity. No edema.  SKIN: Clean looking the skin ulceration over lower abdominal wall and left groin area NEURO: Awake, alert and oriented appropriately.  No apparent focal neuro deficit. PSYCH: Calm. Normal affect.   Discharge Instructions  Discharge Instructions     Call MD for:  extreme fatigue   Complete by: As directed    Call MD for:  persistant dizziness or light-headedness   Complete by: As directed    Call MD for:  persistant nausea and vomiting   Complete by: As directed    Call MD for:  severe uncontrolled pain   Complete by: As directed    Call MD for:  temperature >100.4   Complete by: As directed    Diet general   Complete by: As directed    Discharge instructions   Complete by: As directed    It has been a pleasure taking care of you!  You were hospitalized due to nausea, vomiting, diarrhea and abdominal pain. Your tests did not show infection or any major abnormality to explain your symptoms.  Fortunately, your symptoms resolved to the point we think it is safe to let you go home and follow-up with your doctors.  It is very important that you take your medications as prescribed.  Please follow-up with your primary care doctor in 1 to 2 weeks.  We also recommend you follow-up with gynecologist for the cervical lesion.  We recommend you avoid marijuana, tobacco and alcohol.  Marijuana has a potential for cyclic vomiting.    Take care,   Discharge wound care:   Complete by: As directed    Apply single layer of calcium alginate (cut to fit) Lawson # [phone] over open areas groin and pannus.  Top with foam or dry dressing. Change alginate daily   Increase activity slowly   Complete by: As directed       Allergies as of  01/27/2021       Reactions   Bee Venom Anaphylaxis, Shortness Of Breath, Swelling   Ferrlecit [na Ferric Gluc Cplx In Sucrose] Swelling   Swelling and puffiness in hands and legs after Ferrlecit 250 mg infused over 60 minutes (possibly infusion related reaction)   Morphine And Related Hives, Itching   Just can't take "morphine", vicodin is OK        Medication List     TAKE these medications    acetaminophen 500 MG tablet Commonly known as: TYLENOL Take 500 mg by mouth every 6 (six) hours as needed for moderate pain.   bictegravir-emtricitabine-tenofovir AF 50-200-25 MG Tabs tablet Commonly known as: BIKTARVY Take 1 tablet by mouth daily.   cyanocobalamin 1000 MCG tablet Take 1 tablet (1,000 mcg total) by mouth daily. Start taking on: January 28, 2021   ferrous sulfate 325 (65 FE) MG EC tablet Take 1 tablet (325 mg total) by mouth 2 (two) times daily.   senna-docusate 8.6-50 MG tablet Commonly known as: Senokot-S Take 1 tablet by mouth 2 (two) times daily between meals as needed for mild constipation.  sulfamethoxazole-trimethoprim 800-160 MG tablet Commonly known as: BACTRIM DS Take 1 tablet by mouth 3 (three) times a week.       ASK your doctor about these medications    diphenhydrAMINE 25 mg capsule Commonly known as: Benadryl Allergy Take 1 capsule (25 mg total) by mouth every 6 (six) hours as needed.   gabapentin 300 MG capsule Commonly known as: NEURONTIN Take 1 capsule (300 mg total) by mouth 2 (two) times daily.   valACYclovir 500 MG tablet Commonly known as: VALTREX TAKE 1 TABLET (500 MG TOTAL) BY MOUTH TWO TIMES DAILY FOR 12 DAYS.               Discharge Care Instructions  (From admission, onward)           Start     Ordered   01/27/21 0000  Discharge wound care:       Comments: Apply single layer of calcium alginate (cut to fit) Lawson # [phone] over open areas groin and pannus.  Top with foam or dry dressing. Change alginate daily    01/27/21 0933            Consultations: Infectious disease  Procedures/Studies:   CT ABDOMEN PELVIS W CONTRAST  Result Date: 01/24/2021 CLINICAL DATA:  Abdominal distension. EXAM: CT ABDOMEN AND PELVIS WITH CONTRAST TECHNIQUE: Multidetector CT imaging of the abdomen and pelvis was performed using the standard protocol following bolus administration of intravenous contrast. CONTRAST:  54m OMNIPAQUE IOHEXOL 350 MG/ML SOLN COMPARISON:  CT Abdomen Pelvis, 08/26/2020. FINDINGS: Lower chest: No acute abnormality. Hepatobiliary: No focal liver abnormality is seen. No gallstones, gallbladder wall thickening, or biliary dilatation. Pancreas: Unremarkable. No pancreatic ductal dilatation or surrounding inflammatory changes. Spleen: Normal in size without focal abnormality. Adrenals/Urinary Tract: *Adrenal glands are unremarkable. *Multiple, punctate non-obstructing calculi within the renal collecting systems. The largest measures up to 4 mm, and is located within the midpolar right kidney. *No hydronephrosis *Subcentimeter right hypodense renal lesion, too small for adequate CT has a likely a small cyst. *Bladder is unremarkable. Stomach/Bowel: Stomach is within normal limits. Enteral contrast of opacification of stomach, small bowel and colon. No extraluminal contrast extravasation. Appendix appears normal. No evidence of bowel wall thickening, distention, or inflammatory changes. Vascular/Lymphatic: No significant vascular findings are present. Similar appearance of prominent inguinal lymph nodes, likely reactive. Reproductive: Uterus and bilateral adnexa are normal. Dominant left ovarian follicle. Pelvic phleboliths. Other: No abdominal wall hernia.  No abdominopelvic ascites. Musculoskeletal: Similar appearance of skin thickening at the lower abdominal (see key image). No acute or significant osseous findings. IMPRESSION: 1. Punctate, nonobstructing renal calculi.  No hydronephrosis. 2. Lower abdominal  wall skin thickening. Findings are unchanged from comparison and may represent cellulitis. Electronically Signed   By: JMichaelle BirksMD   On: 01/24/2021 15:05   DG Chest Port 1 View  Result Date: 01/23/2021 CLINICAL DATA:  Vomiting for 2 weeks with history of body aches and dizziness EXAM: PORTABLE CHEST 1 VIEW COMPARISON:  August 26, 2020. FINDINGS: Cardiomediastinal contours and hilar structures are normal. No lobar consolidation. No sign of pleural effusion. No visible pneumothorax. On limited assessment no acute skeletal process. IMPRESSION: No acute cardiopulmonary disease. Electronically Signed   By: GZetta BillsM.D.   On: 01/23/2021 14:27       The results of significant diagnostics from this hospitalization (including imaging, microbiology, ancillary and laboratory) are listed below for reference.     Microbiology: Recent Results (from the past 240 hour(s))  Resp Panel  by RT-PCR (Flu A&B, Covid) Nasopharyngeal Swab     Status: None   Collection Time: 01/23/21  2:00 PM   Specimen: Nasopharyngeal Swab; Nasopharyngeal(NP) swabs in vial transport medium  Result Value Ref Range Status   SARS Coronavirus 2 by RT PCR NEGATIVE NEGATIVE Final    Comment: (NOTE) SARS-CoV-2 target nucleic acids are NOT DETECTED.  The SARS-CoV-2 RNA is generally detectable in upper respiratory specimens during the acute phase of infection. The lowest concentration of SARS-CoV-2 viral copies this assay can detect is 138 copies/mL. A negative result does not preclude SARS-Cov-2 infection and should not be used as the sole basis for treatment or other patient management decisions. A negative result may occur with  improper specimen collection/handling, submission of specimen other than nasopharyngeal swab, presence of viral mutation(s) within the areas targeted by this assay, and inadequate number of viral copies(<138 copies/mL). A negative result must be combined with clinical observations, patient history,  and epidemiological information. The expected result is Negative.  Fact Sheet for Patients:  EntrepreneurPulse.com.au  Fact Sheet for Healthcare Providers:  IncredibleEmployment.be  This test is no t yet approved or cleared by the [place] FDA and  has been authorized for detection and/or diagnosis of SARS-CoV-2 by FDA under an Emergency Use Authorization (EUA). This EUA will remain  in effect (meaning this test can be used) for the duration of the COVID-19 declaration under Section 564(b)(1) of the Act, 21 U.S.C.section 360bbb-3(b)(1), unless the authorization is terminated  or revoked sooner.       Influenza A by PCR NEGATIVE NEGATIVE Final   Influenza B by PCR NEGATIVE NEGATIVE Final    Comment: (NOTE) The Xpert Xpress SARS-CoV-2/FLU/RSV plus assay is intended as an aid in the diagnosis of influenza from Nasopharyngeal swab specimens and should not be used as a sole basis for treatment. Nasal washings and aspirates are unacceptable for Xpert Xpress SARS-CoV-2/FLU/RSV testing.  Fact Sheet for Patients: EntrepreneurPulse.com.au  Fact Sheet for Healthcare Providers: IncredibleEmployment.be  This test is not yet approved or cleared by the [place] FDA and has been authorized for detection and/or diagnosis of SARS-CoV-2 by FDA under an Emergency Use Authorization (EUA). This EUA will remain in effect (meaning this test can be used) for the duration of the COVID-19 declaration under Section 564(b)(1) of the Act, 21 U.S.C. section 360bbb-3(b)(1), unless the authorization is terminated or revoked.  Performed at [facility], [address] [place], [place] [address]   Urine Culture     Status: Abnormal   Collection Time: 01/24/21  8:03 AM   Specimen: Urine, Clean Catch  Result Value Ref Range Status   Specimen Description   Final    URINE, CLEAN CATCH Performed at [facility], [place] [address], [place], [place] [address]    Special Requests   Final    NONE Performed at [facility], [place] [address] [place], [place] [address]    Culture 90,000 COLONIES/mL ESCHERICHIA COLI (A)  Final   Report Status 01/27/2021 FINAL  Final   Organism ID, Bacteria ESCHERICHIA COLI (A)  Final      Susceptibility   Escherichia coli - MIC*    AMPICILLIN 8 SENSITIVE Sensitive     CEFAZOLIN <=4 SENSITIVE Sensitive     CEFEPIME <=0.12 SENSITIVE Sensitive     CEFTRIAXONE <=0.25 SENSITIVE Sensitive     CIPROFLOXACIN <=0.25 SENSITIVE Sensitive     GENTAMICIN <=1 SENSITIVE Sensitive     IMIPENEM <=0.25 SENSITIVE  Sensitive     NITROFURANTOIN <=16 SENSITIVE Sensitive     TRIMETH/SULFA <=20 SENSITIVE Sensitive     AMPICILLIN/SULBACTAM 4 SENSITIVE Sensitive     PIP/TAZO <=4 SENSITIVE Sensitive     * 90,000 COLONIES/mL ESCHERICHIA COLI  Gastrointestinal Panel by PCR , Stool     Status: None   Collection Time: 01/25/21  7:50 PM   Specimen: Stool  Result Value Ref Range Status   Campylobacter species NOT DETECTED NOT DETECTED Final   Plesimonas shigelloides NOT DETECTED NOT DETECTED Final   Salmonella species NOT DETECTED NOT DETECTED Final   Yersinia enterocolitica NOT DETECTED NOT DETECTED Final   Vibrio species NOT DETECTED NOT DETECTED Final   Vibrio cholerae NOT DETECTED NOT DETECTED Final   Enteroaggregative E coli (EAEC) NOT DETECTED NOT DETECTED Final   Enteropathogenic E coli (EPEC) NOT DETECTED NOT DETECTED Final   Enterotoxigenic E coli (ETEC) NOT DETECTED NOT DETECTED Final   Shiga like toxin producing E coli (STEC) NOT DETECTED NOT DETECTED Final   Shigella/Enteroinvasive E coli (EIEC) NOT DETECTED NOT DETECTED Final   Cryptosporidium NOT DETECTED NOT DETECTED Final   Cyclospora cayetanensis NOT DETECTED NOT DETECTED Final   Entamoeba histolytica NOT DETECTED NOT DETECTED Final   Giardia lamblia NOT DETECTED NOT DETECTED Final    Adenovirus F40/41 NOT DETECTED NOT DETECTED Final   Astrovirus NOT DETECTED NOT DETECTED Final   Norovirus GI/GII NOT DETECTED NOT DETECTED Final   Rotavirus A NOT DETECTED NOT DETECTED Final   Sapovirus (I, II, IV, and V) NOT DETECTED NOT DETECTED Final    Comment: Performed at [facility], [place] [place], [place] [address]  C Difficile Quick Screen w PCR reflex     Status: None   Collection Time: 01/25/21  7:51 PM   Specimen: STOOL  Result Value Ref Range Status   C Diff antigen NEGATIVE NEGATIVE Final   C Diff toxin NEGATIVE NEGATIVE Final   C Diff interpretation NEGATIVE  Final    Comment: Performed at [facility], [place] [address], [place], [place] [address]     Labs:  CBC: Recent Labs  Lab 01/23/21 1356 01/23/21 2133 01/24/21 0011 01/25/21 0635 01/26/21 0610 01/27/21 0508  WBC 3.8* 4.1 4.0 3.4* 2.3* 2.7*  NEUTROABS 2.7  --   --   --  1.7  --   HGB 9.8* 8.3* 8.2* 7.9* 8.1* 8.5*  HCT 31.1* 27.4* 26.6* 25.9* 26.1* 27.6*  MCV 77.4* 79.9* 79.6* 79.9* 80.8 81.7  PLT 273 229 226 228 233 244   BMP &GFR Recent Labs  Lab 01/23/21 1356 01/23/21 2133 01/24/21 0011 01/25/21 0635 01/26/21 0610 01/27/21 0508  NA 132* 131* 136 133* 133* 134*  K 2.8* 3.3* 3.5 3.8 3.5 4.2  CL 101 105 109 106 106 106  CO2 22 21* 21* '23 23 23  '$ GLUCOSE 89 131* 91 83 89 91  BUN '18 13 13 11 13 15  '$ CREATININE 0.86 0.75 0.80 0.59 0.74 0.70  CALCIUM 8.6* 8.0* 8.5* 8.0* 7.5* 7.8*  MG 1.5* 1.7 1.7 1.5* 1.6* 1.5*  PHOS 4.4  --   --  4.1 4.3 4.5   Estimated Creatinine Clearance: 71.9 mL/min (by C-G formula based on SCr of 0.7 mg/dL). Liver & Pancreas: Recent Labs  Lab 01/23/21 1356 01/24/21 0011 01/25/21 0635 01/26/21 0610 01/27/21 0508  AST 21 17  --   --   --   ALT 13 11  --   --   --  ALKPHOS 87 73  --   --   --   BILITOT 0.4 0.4  --   --   --   PROT 9.1* 7.6  --   --   --   ALBUMIN 2.9* 2.5* 2.2* 2.1* 2.0*   Recent Labs  Lab 01/23/21 1356   LIPASE 36   No results for input(s): AMMONIA in the last 168 hours. Diabetic: No results for input(s): HGBA1C in the last 72 hours. No results for input(s): GLUCAP in the last 168 hours. Cardiac Enzymes: Recent Labs  Lab 01/24/21 1245  CKTOTAL 26*   No results for input(s): PROBNP in the last 8760 hours. Coagulation Profile: Recent Labs  Lab 01/23/21 1356  INR 1.1   Thyroid Function Tests: No results for input(s): TSH, T4TOTAL, FREET4, T3FREE, THYROIDAB in the last 72 hours. Lipid Profile: No results for input(s): CHOL, HDL, LDLCALC, TRIG, CHOLHDL, LDLDIRECT in the last 72 hours. Anemia Panel: Recent Labs    01/25/21 0635  VITAMINB12 212  FOLATE 9.3  FERRITIN 101  TIBC 225*  IRON 10*  RETICCTPCT <0.4*   Urine analysis:    Component Value Date/Time   COLORURINE YELLOW 01/23/2021 1356   APPEARANCEUR HAZY (A) 01/23/2021 1356   LABSPEC 1.010 01/23/2021 1356   PHURINE 6.5 01/23/2021 1356   GLUCOSEU NEGATIVE 01/23/2021 1356   GLUCOSEU NEG mg/dL 06/28/2008 2142   HGBUR TRACE (A) 01/23/2021 1356   BILIRUBINUR NEGATIVE 01/23/2021 1356   KETONESUR NEGATIVE 01/23/2021 1356   PROTEINUR 30 (A) 01/23/2021 1356   UROBILINOGEN 1.0 08/12/2014 2350   NITRITE POSITIVE (A) 01/23/2021 1356   LEUKOCYTESUR LARGE (A) 01/23/2021 1356   Sepsis Labs: Invalid input(s): PROCALCITONIN, LACTICIDVEN   Time coordinating discharge: 40 minutes  SIGNED:  Mercy Riding, MD  [facility] 01/27/2021, 9:33 AM  If 7PM-7AM, please contact night-coverage [email or web address]

## 2021-01-27 NOTE — Consult Note (Signed)
[facility] Nurse Consult Note: remote using review of records and images in the patient's chart  Reason for Consult: groin and abdominal wound. Long standing history of open wounds; once thought to be related to STD/HIV status. They are chronic in nature and being immunocompromised they most likely will not heel. Updated orders for absorptive dressings to manage drainage and as well keep areas moist and protected  Wound type: full thickness lesions; groin and abdomen  Pressure Injury POA: NA Measurement: see nursing flow sheet Wound bed:100% pink, pale  Drainage (amount, consistency, odor) see nursing flow sheet  Periwound: intact  Dressing procedure/placement/frequency: Calcium alginate cut to fit open wounds, cover with foam or dry dressings. Change daily.    Re consult if needed, will not follow at this time. Thanks  Alysandra Lobue [employer], RN,CWOCN, CNS, [place] [phone])

## 2021-02-11 ENCOUNTER — Ambulatory Visit: Payer: Medicaid Other | Admitting: Infectious Diseases

## 2021-10-26 ENCOUNTER — Emergency Department (HOSPITAL_COMMUNITY)
Admission: EM | Admit: 2021-10-26 | Discharge: 2021-10-27 | Disposition: A | Discharge status: Home / Self Care | Payer: Medicaid Other | Attending: Emergency Medicine | Admitting: Emergency Medicine

## 2021-10-26 ENCOUNTER — Other Ambulatory Visit: Payer: Self-pay

## 2021-10-26 ENCOUNTER — Emergency Department (HOSPITAL_COMMUNITY): Payer: Medicaid Other

## 2021-10-26 DIAGNOSIS — X58XXXA Exposure to other specified factors, initial encounter: Secondary | ICD-10-CM | POA: Diagnosis not present

## 2021-10-26 DIAGNOSIS — S71101A Unspecified open wound, right thigh, initial encounter: Secondary | ICD-10-CM | POA: Insufficient documentation

## 2021-10-26 DIAGNOSIS — Y9389 Activity, other specified: Secondary | ICD-10-CM | POA: Insufficient documentation

## 2021-10-26 DIAGNOSIS — Z79899 Other long term (current) drug therapy: Secondary | ICD-10-CM | POA: Insufficient documentation

## 2021-10-26 DIAGNOSIS — E876 Hypokalemia: Secondary | ICD-10-CM | POA: Insufficient documentation

## 2021-10-26 DIAGNOSIS — Z21 Asymptomatic human immunodeficiency virus [HIV] infection status: Secondary | ICD-10-CM | POA: Diagnosis not present

## 2021-10-26 DIAGNOSIS — Y999 Unspecified external cause status: Secondary | ICD-10-CM | POA: Diagnosis not present

## 2021-10-26 DIAGNOSIS — L989 Disorder of the skin and subcutaneous tissue, unspecified: Secondary | ICD-10-CM

## 2021-10-26 DIAGNOSIS — R202 Paresthesia of skin: Secondary | ICD-10-CM | POA: Insufficient documentation

## 2021-10-26 DIAGNOSIS — S31103A Unspecified open wound of abdominal wall, right lower quadrant without penetration into peritoneal cavity, initial encounter: Secondary | ICD-10-CM | POA: Insufficient documentation

## 2021-10-26 LAB — COMPREHENSIVE METABOLIC PANEL
ALT: 20 U/L (ref 0–44)
AST: 37 U/L (ref 15–41)
Albumin: 3.5 g/dL (ref 3.5–5.0)
Alkaline Phosphatase: 86 U/L (ref 38–126)
Anion gap: 6 (ref 5–15)
BUN: 24 mg/dL — ABNORMAL HIGH (ref 6–20)
CO2: 22 mmol/L (ref 22–32)
Calcium: 8.6 mg/dL — ABNORMAL LOW (ref 8.9–10.3)
Chloride: 113 mmol/L — ABNORMAL HIGH (ref 98–111)
Creatinine, Ser: 0.73 mg/dL (ref 0.44–1.00)
GFR, Estimated: >60 mL/min (ref >60)
Glucose, Bld: 76 mg/dL (ref 70–99)
Potassium: 3 mmol/L — ABNORMAL LOW (ref 3.5–5.1)
Sodium: 141 mmol/L (ref 135–145)
Total Bilirubin: 1 mg/dL (ref 0.3–1.2)
Total Protein: 8.5 g/dL — ABNORMAL HIGH (ref 6.5–8.1)

## 2021-10-26 LAB — CBC WITH DIFFERENTIAL/PLATELET
Abs Immature Granulocytes: 0.01 10*3/uL (ref 0.00–0.07)
Basophils Absolute: 0 10*3/uL (ref 0.0–0.1)
Basophils Relative: 1 %
Eosinophils Absolute: 0.1 10*3/uL (ref 0.0–0.5)
Eosinophils Relative: 3 %
HCT: 32.2 % — ABNORMAL LOW (ref 36.0–46.0)
Hemoglobin: 10.6 g/dL — ABNORMAL LOW (ref 12.0–15.0)
Immature Granulocytes: 1 %
Lymphocytes Relative: 22 %
Lymphs Abs: 0.5 10*3/uL — ABNORMAL LOW (ref 0.7–4.0)
MCH: 28 pg (ref 26.0–34.0)
MCHC: 32.9 g/dL (ref 30.0–36.0)
MCV: 85 fL (ref 80.0–100.0)
Monocytes Absolute: 0.3 10*3/uL (ref 0.1–1.0)
Monocytes Relative: 14 %
Neutro Abs: 1.3 10*3/uL — ABNORMAL LOW (ref 1.7–7.7)
Neutrophils Relative %: 59 %
Platelets: 152 10*3/uL (ref 150–400)
RBC: 3.79 MIL/uL — ABNORMAL LOW (ref 3.87–5.11)
RDW: 14.6 % (ref 11.5–15.5)
WBC: 2.1 10*3/uL — ABNORMAL LOW (ref 4.0–10.5)
nRBC: 0 % (ref 0.0–0.2)

## 2021-10-26 MED ORDER — IOHEXOL 300 MG/ML  SOLN
100.0000 mL | Freq: Once | INTRAMUSCULAR | Status: AC | PRN
  Administered 2021-10-26: 100 mL via INTRAVENOUS

## 2021-10-26 MED ORDER — DOXYCYCLINE HYCLATE 100 MG PO CAPS: 100.0000 mg | ORAL_CAPSULE | Freq: Two times a day (BID) | ORAL | 0 refills | Status: DC

## 2021-10-26 MED ORDER — POTASSIUM CHLORIDE CRYS ER 20 MEQ PO TBCR
20.0000 meq | EXTENDED_RELEASE_TABLET | Freq: Once | ORAL | Status: AC
  Administered 2021-10-26: 20 meq via ORAL
  Filled 2021-10-26: qty 1

## 2021-10-26 NOTE — ED Provider Triage Note (Signed)
Emergency Medicine Provider Triage Evaluation Note ? ?[name] , a 41 y.o. female  was evaluated in triage.  Pt complains of chronic wounds to the lower abdomen and left inguinal region.  States that he has been an ongoing issue and have recently worsened.  Also reports intermittent numbness to the feet.  Last occurred this morning.  Denies any current numbness.  States that she has a history of HIV and has not been on any antivirals for more than 6 months. ? ?Physical Exam  ?BP 108/64   Pulse (!) 113   Temp 97.8 ?F (36.6 ?C) (Oral)   Resp 18   Ht '5\' 1"'$  (1.549 m)   Wt 63.5 kg   SpO2 100%   BMI 26.45 kg/m?  ?Gen:   Awake, no distress   ?Resp:  Normal effort  ?MSK:   Moves extremities without difficulty  ?Other:   ? ?Medical Decision Making  ?Medically screening exam initiated at 5:37 PM.  Appropriate orders placed.  [facility] [name] was informed that the remainder of the evaluation will be completed by another provider, this initial triage assessment does not replace that evaluation, and the importance of remaining in the ED until their evaluation is complete. ?  ?Rayna Sexton, PA-C ?10/26/21 1737 ? ?

## 2021-10-26 NOTE — ED Provider Notes (Signed)
?[facility] DEPT ?Provider Note ? ? ?CSN: [id number] ?Arrival date & time: 10/26/21  1610 ? ?  ? ?History ? ?Chief Complaint  ?Patient presents with  ? Numbness  ? Wound Check  ? ? ?[name] is a 41 y.o. female. ? ?Patient with a history of HIV.  Patient complains of numbness in her legs.  This is happened before and she is not on her Neurontin that she is felt to be taken.  Also patient complains of an ulcer to her abdomen and her legs that does not seem to be healing. ? ?The history is provided by the patient and medical records.  ?Wound Check ?This is a new problem. The current episode started more than 2 days ago. The problem occurs constantly. The problem has not changed since onset.Pertinent negatives include no chest pain, no abdominal pain and no headaches. Nothing aggravates the symptoms. Nothing relieves the symptoms. She has tried nothing for the symptoms. The treatment provided no relief.  ? ?  ? ?Home Medications ?Prior to Admission medications   ?Medication Sig Start Date End Date Taking? Authorizing Provider  ?acetaminophen (TYLENOL) 500 MG tablet Take 500 mg by mouth every 6 (six) hours as needed for moderate pain.   Yes [provider]  ?bictegravir-emtricitabine-tenofovir AF (BIKTARVY) 50-200-25 MG TABS tablet Take 1 tablet by mouth daily. 01/27/21  Yes Mercy Riding, MD  ?doxycycline (VIBRAMYCIN) 100 MG capsule Take 1 capsule (100 mg total) by mouth 2 (two) times daily. 10/26/21  Yes Milton Ferguson, MD  ?diphenhydrAMINE (BENADRYL ALLERGY) 25 mg capsule Take 1 capsule (25 mg total) by mouth every 6 (six) hours as needed. ?Patient not taking: Reported on 01/24/2021 09/10/20   Rosiland Oz, MD  ?ferrous sulfate 325 (65 FE) MG EC tablet Take 1 tablet (325 mg total) by mouth 2 (two) times daily. ?Patient not taking: Reported on 10/26/2021 01/27/21 07/26/21  Mercy Riding, MD  ?gabapentin (NEURONTIN) 300 MG capsule Take 1 capsule (300 mg total) by mouth 2 (two) times  daily. ?Patient not taking: Reported on 01/24/2021 10/02/20   Argentina Donovan, PA-C  ?senna-docusate (SENOKOT-S) 8.6-50 MG tablet Take 1 tablet by mouth 2 (two) times daily between meals as needed for mild constipation. ?Patient not taking: Reported on 10/26/2021 01/27/21   Mercy Riding, MD  ?vitamin B-12 1000 MCG tablet Take 1 tablet (1,000 mcg total) by mouth daily. ?Patient not taking: Reported on 10/26/2021 01/28/21   Mercy Riding, MD  ?   ? ?Allergies    ?Bee venom, Ferrlecit [na ferric gluc cplx in sucrose], and Morphine and related   ? ?Review of Systems   ?Review of Systems  ?Constitutional:  Negative for appetite change and fatigue.  ?HENT:  Negative for congestion, ear discharge and sinus pressure.   ?Eyes:  Negative for discharge.  ?Respiratory:  Negative for cough.   ?Cardiovascular:  Negative for chest pain.  ?Gastrointestinal:  Negative for abdominal pain and diarrhea.  ?Genitourinary:  Negative for frequency and hematuria.  ?Musculoskeletal:  Negative for back pain.  ?Skin:  Positive for rash.  ?     Ulcer to lower abdomen and left groin.  ?Neurological:  Negative for seizures and headaches.  ?Psychiatric/Behavioral:  Negative for hallucinations.   ? ?Physical Exam ?Updated Vital Signs ?BP 103/73   Pulse 87   Temp 97.8 ?F (36.6 ?C) (Oral)   Resp (!) 22   Ht '5\' 1"'$  (1.549 m)   Wt 63.5 kg   SpO2  100%   BMI 26.45 kg/m?  ?Physical Exam ?Vitals and nursing note reviewed.  ?Constitutional:   ?   Appearance: She is well-developed.  ?HENT:  ?   Head: Normocephalic.  ?   Mouth/Throat:  ?   Mouth: Mucous membranes are moist.  ?Eyes:  ?   General: No scleral icterus. ?   Conjunctiva/sclera: Conjunctivae normal.  ?Neck:  ?   Thyroid: No thyromegaly.  ?Cardiovascular:  ?   Rate and Rhythm: Normal rate and regular rhythm.  ?   Heart sounds: No murmur heard. ?  No friction rub. No gallop.  ?Pulmonary:  ?   Breath sounds: No stridor. No wheezing or rales.  ?Chest:  ?   Chest wall: No tenderness.  ?Abdominal:  ?    General: There is no distension.  ?   Tenderness: There is no abdominal tenderness. There is no rebound.  ?   Comments: Patient with an to the skin of the lower abdomen and left thigh  ?Musculoskeletal:     ?   General: Normal range of motion.  ?   Cervical back: Neck supple.  ?Lymphadenopathy:  ?   Cervical: No cervical adenopathy.  ?Skin: ?   Findings: No erythema or rash.  ?Neurological:  ?   Mental Status: She is alert and oriented to person, place, and time.  ?   Motor: No abnormal muscle tone.  ?   Coordination: Coordination normal.  ?   Comments: Strength in legs normal and sensation normal.  ?Psychiatric:     ?   Behavior: Behavior normal.  ? ? ?ED Results / Procedures / Treatments   ?Labs ?(all labs ordered are listed, but only abnormal results are displayed) ?Labs Reviewed  ?COMPREHENSIVE METABOLIC PANEL - Abnormal; Notable for the following components:  ?    Result Value  ? Potassium 3.0 (*)   ? Chloride 113 (*)   ? BUN 24 (*)   ? Calcium 8.6 (*)   ? Total Protein 8.5 (*)   ? All other components within normal limits  ?CBC WITH DIFFERENTIAL/PLATELET - Abnormal; Notable for the following components:  ? WBC 2.1 (*)   ? RBC 3.79 (*)   ? Hemoglobin 10.6 (*)   ? HCT 32.2 (*)   ? Neutro Abs 1.3 (*)   ? Lymphs Abs 0.5 (*)   ? All other components within normal limits  ? ? ?EKG ?None ? ?Radiology ?CT ABDOMEN PELVIS W CONTRAST ? ?Result Date: 10/26/2021 ?CLINICAL DATA:  Wound to the right lower abdomen and upper left leg. EXAM: CT ABDOMEN AND PELVIS WITH CONTRAST TECHNIQUE: Multidetector CT imaging of the abdomen and pelvis was performed using the standard protocol following bolus administration of intravenous contrast. RADIATION DOSE REDUCTION: This exam was performed according to the departmental dose-optimization program which includes automated exposure control, adjustment of the mA and/or kV according to patient size and/or use of iterative reconstruction technique. CONTRAST:  147m OMNIPAQUE IOHEXOL 300 MG/ML   SOLN COMPARISON:  January 24, 2021 FINDINGS: Lower chest: No acute abnormality. Hepatobiliary: There is diffuse fatty infiltration of the liver parenchyma. No focal liver abnormality is seen. No gallstones, gallbladder wall thickening, or biliary dilatation. Pancreas: Unremarkable. No pancreatic ductal dilatation or surrounding inflammatory changes. Spleen: Normal in size without focal abnormality. Adrenals/Urinary Tract: Adrenal glands are unremarkable. Kidneys are normal, without obstructing renal calculi, focal lesion, or hydronephrosis. 3 mm nonobstructing renal calculi are seen within the mid and lower portions of both kidneys. Bladder is unremarkable. Stomach/Bowel: Stomach  is within normal limits. Appendix appears normal. No evidence of bowel wall thickening, distention, or inflammatory changes. Vascular/Lymphatic: No significant vascular findings are present. There is mild bilateral inguinal lymphadenopathy, right slightly greater than left. Reproductive: Uterus and bilateral adnexa are unremarkable. Other: A mildly lobulated appearing area of cutaneous thickening is again seen along the anterior aspect of the lower abdominal wall (axial CT images 65 through 68, CT series 2). Stable, lobulated cutaneous thickening is also seen along the lateral aspect of the labia on the left (axial CT images 80 through 84, CT series 2). No abdominopelvic ascites. Musculoskeletal: No acute or significant osseous findings. IMPRESSION: 1. Hepatic steatosis. 2. Bilateral subcentimeter nonobstructing renal calculi. 3. Stable areas cutaneous thickening along the anterior lower abdominal wall and labia. While this may be secondary to chronic inflammation, sequelae associated with an underlying neoplasm cannot be excluded. Electronically Signed   By: Virgina Norfolk M.D.   On: 10/26/2021 22:40  ? ?CT L-SPINE NO CHARGE ? ?Result Date: 10/26/2021 ?CLINICAL DATA:  Lower extremity numbness EXAM: CT LUMBAR SPINE WITHOUT CONTRAST  TECHNIQUE: Multidetector CT imaging of the lumbar spine was performed without intravenous contrast administration. Multiplanar CT image reconstructions were also generated. RADIATION DOSE REDUCTION: This exam was perfo

## 2021-10-26 NOTE — ED Triage Notes (Signed)
Pt states she has a wound to her right lower abdomen and upper left leg. States she has had these for weeks but states they have become more painful. Pt also reports that both of her lower legs went numb today. States this has happened before but went away. Denies numbness now. ?

## 2021-10-26 NOTE — Discharge Instructions (Addendum)
Start back taking your Neurontin.  Follow-up with your infectious disease doctor this week.  Call Monday to make an appointment ?

## 2022-01-02 ENCOUNTER — Other Ambulatory Visit: Payer: Self-pay

## 2022-01-02 ENCOUNTER — Encounter (HOSPITAL_BASED_OUTPATIENT_CLINIC_OR_DEPARTMENT_OTHER): Payer: Self-pay

## 2022-01-02 ENCOUNTER — Emergency Department (HOSPITAL_BASED_OUTPATIENT_CLINIC_OR_DEPARTMENT_OTHER)
Admission: EM | Admit: 2022-01-02 | Discharge: 2022-01-02 | Disposition: A | Discharge status: Home / Self Care | Payer: Medicaid Other | Attending: Emergency Medicine | Admitting: Emergency Medicine

## 2022-01-02 DIAGNOSIS — G629 Polyneuropathy, unspecified: Secondary | ICD-10-CM | POA: Insufficient documentation

## 2022-01-02 DIAGNOSIS — Z21 Asymptomatic human immunodeficiency virus [HIV] infection status: Secondary | ICD-10-CM | POA: Diagnosis not present

## 2022-01-02 DIAGNOSIS — L98499 Non-pressure chronic ulcer of skin of other sites with unspecified severity: Secondary | ICD-10-CM | POA: Insufficient documentation

## 2022-01-02 DIAGNOSIS — M79671 Pain in right foot: Secondary | ICD-10-CM | POA: Diagnosis present

## 2022-01-02 DIAGNOSIS — Z79899 Other long term (current) drug therapy: Secondary | ICD-10-CM | POA: Insufficient documentation

## 2022-01-02 MED ORDER — DOXYCYCLINE HYCLATE 100 MG PO CAPS: 100.0000 mg | ORAL_CAPSULE | Freq: Two times a day (BID) | ORAL | 0 refills | Status: DC

## 2022-01-02 MED ORDER — OXYCODONE HCL 5 MG PO TABS: 5.0000 mg | ORAL_TABLET | Freq: Four times a day (QID) | ORAL | 0 refills | Status: DC | PRN

## 2022-01-02 MED ORDER — OXYCODONE HCL 5 MG PO TABS
5.0000 mg | ORAL_TABLET | Freq: Once | ORAL | Status: AC
  Administered 2022-01-02: 5 mg via ORAL
  Filled 2022-01-02: qty 1

## 2022-01-02 NOTE — ED Provider Notes (Signed)
[name] Provider Note   CSN: [id number] Arrival date & time: 01/02/22  1832     History  Chief Complaint  Patient presents with   Leg Pain   Fall    [name] is a 41 y.o. female.  Patient here with acute on chronic bilateral feet pain.  History of HIV, peripheral neuropathy, chronic skin ulcer, Kaposi's sarcoma.  She states she has been having worsening pain in her feet which is her main reason for her visit today.  The pain was so bad that she has had some falls.  She is not having any other extremity tenderness.  She has used gabapentin in the past but that made her feel more sick.  She has a chronic ulcer on her abdomen that for the most part is doing well.  She is worried that maybe she has a little infection on her feet as well.  She denies any fevers, chills.  Has had issues following with her primary care doctor due to transportation issues.  She is currently living with her brother in [place].  The history is provided by the patient.       Home Medications Prior to Admission medications   Medication Sig Start Date End Date Taking? Authorizing Provider  acetaminophen (TYLENOL) 500 MG tablet Take 500 mg by mouth every 6 (six) hours as needed for moderate pain.    [provider]  bictegravir-emtricitabine-tenofovir AF (BIKTARVY) 50-200-25 MG TABS tablet Take 1 tablet by mouth daily. 01/27/21   Mercy Riding, MD  diphenhydrAMINE (BENADRYL ALLERGY) 25 mg capsule Take 1 capsule (25 mg total) by mouth every 6 (six) hours as needed. Patient not taking: Reported on 01/24/2021 09/10/20   Rosiland Oz, MD  doxycycline (VIBRAMYCIN) 100 MG capsule Take 1 capsule (100 mg total) by mouth 2 (two) times daily. 01/02/22   Earl Losee, DO  ferrous sulfate 325 (65 FE) MG EC tablet Take 1 tablet (325 mg total) by mouth 2 (two) times daily. Patient not taking: Reported on 10/26/2021 01/27/21 07/26/21  Mercy Riding, MD  gabapentin  (NEURONTIN) 300 MG capsule Take 1 capsule (300 mg total) by mouth 2 (two) times daily. Patient not taking: Reported on 01/24/2021 10/02/20   Argentina Donovan, PA-C  oxyCODONE (ROXICODONE) 5 MG immediate release tablet Take 1 tablet (5 mg total) by mouth every 6 (six) hours as needed for up to 15 doses for breakthrough pain. 01/02/22   Eunice Oldaker, DO  senna-docusate (SENOKOT-S) 8.6-50 MG tablet Take 1 tablet by mouth 2 (two) times daily between meals as needed for mild constipation. Patient not taking: Reported on 10/26/2021 01/27/21   Mercy Riding, MD  vitamin B-12 1000 MCG tablet Take 1 tablet (1,000 mcg total) by mouth daily. Patient not taking: Reported on 10/26/2021 01/28/21   Mercy Riding, MD      Allergies    Bee venom, Ferrlecit [na ferric gluc cplx in sucrose], and Morphine and related    Review of Systems   Review of Systems  Physical Exam Updated Vital Signs Ht '5\' 1"'$  (1.549 m)   Wt 56.7 kg   BMI 23.62 kg/m  Physical Exam Vitals and nursing note reviewed.  Constitutional:      General: She is not in acute distress.    Appearance: She is well-developed. She is not ill-appearing.  HENT:     Head: Normocephalic and atraumatic.     Nose: Nose normal.     Mouth/Throat:  Mouth: Mucous membranes are moist.  Eyes:     Extraocular Movements: Extraocular movements intact.     Conjunctiva/sclera: Conjunctivae normal.     Pupils: Pupils are equal, round, and reactive to light.  Cardiovascular:     Rate and Rhythm: Normal rate and regular rhythm.     Heart sounds: No murmur heard. Pulmonary:     Effort: Pulmonary effort is normal. No respiratory distress.     Breath sounds: Normal breath sounds.  Abdominal:     Palpations: Abdomen is soft.     Tenderness: There is no abdominal tenderness.  Musculoskeletal:        General: No swelling or tenderness.     Cervical back: Normal range of motion and neck supple.  Skin:    General: Skin is warm and dry.     Capillary Refill:  Capillary refill takes less than 2 seconds.     Findings: Erythema present.     Comments: Chronic abdominal ulcer with overall no signs of redness or purulence, may be small area of redness around the wound in the right foot  Neurological:     Mental Status: She is alert.  Psychiatric:        Mood and Affect: Mood normal.     ED Results / Procedures / Treatments   Labs (all labs ordered are listed, but only abnormal results are displayed) Labs Reviewed - No data to display  EKG None  Radiology No results found.  Procedures Procedures    Medications Ordered in ED Medications  oxyCODONE (Oxy IR/ROXICODONE) immediate release tablet 5 mg (5 mg Oral Given 01/02/22 1948)    ED Course/ Medical Decision Making/ A&P                           Medical Decision Making Risk Prescription drug management.   [name] is here with acute on chronic pain.  Pain mostly in her bilateral feet.  She is concerned for may be a small infection in her right foot.  She has a small area of redness around the wound on her right foot.  We will treat with antibiotics.  I have no concern for systemic infection.  She has chronic abdominal wall ulcer.  Had a CT scan a few months back that showed fairly extensive chronic abdominal wall ulcer with chronic inflammation.  She has not yet had a biopsy on this area as malignancy is high on the list for this type of finding per that CT report.  She has been having transportation issues as her doctors are in [name] but now she is staying in [name].  Strongly encouraged her to figure out a way to get to her infectious disease team/primary care doctor to help further treat her chronic pain and chronic wound ulcer care.  She has not tolerated gabapentin.  She is fairly uncomfortable and she states that the bilateral feet pain is making it very difficult for her to sleep.  We will prescribe her some narcotic pain medicine to help with this but overall  strongly encouraged to follow primary team to help with her chronic issues.  Overall discharged in good condition.  This chart was dictated using voice recognition software.  Despite best efforts to proofread,  errors can occur which can change the documentation meaning.         Final Clinical Impression(s) / ED Diagnoses Final diagnoses:  Neuropathy  Chronic skin ulcer, unspecified ulcer stage ([place])  Rx / DC Orders ED Discharge Orders          Ordered    Ambulatory referral to Infectious Disease        01/02/22 1943    oxyCODONE (ROXICODONE) 5 MG immediate release tablet  Every 6 hours PRN,   Status:  Discontinued        01/02/22 1943    doxycycline (VIBRAMYCIN) 100 MG capsule  2 times daily,   Status:  Discontinued        01/02/22 1943    oxyCODONE (ROXICODONE) 5 MG immediate release tablet  Every 6 hours PRN        01/02/22 1944    doxycycline (VIBRAMYCIN) 100 MG capsule  2 times daily        01/02/22 1944              Lennice Sites, DO 01/02/22 1952

## 2022-01-02 NOTE — ED Notes (Signed)
Patient verbalizes understanding of discharge instructions. Opportunity for questioning and answers were provided. Armband removed by staff, pt discharged from ED. Wheeled out to lobby  

## 2022-01-02 NOTE — ED Triage Notes (Signed)
Patient states she fell in the shower 2 days ago due to numbness and tingling in bilat feet. Patient states her right leg is swollen with blisters on this.

## 2022-01-02 NOTE — ED Notes (Signed)
Pt states when arriving to room, that is out of HIV medications and has two boils to her right groin area as well

## 2022-01-13 ENCOUNTER — Ambulatory Visit: Payer: Medicaid Other | Admitting: Infectious Diseases

## 2022-01-21 ENCOUNTER — Other Ambulatory Visit (HOSPITAL_COMMUNITY): Payer: Self-pay

## 2022-01-21 ENCOUNTER — Telehealth: Payer: Self-pay

## 2022-01-21 NOTE — Telephone Encounter (Signed)
[name] Teacher, English as a foreign language completed.    The patient is insured through [facility] and has a $4.00 copay.  We will continue to follow to see if copay assistance is needed.  [name], [facility] Patient [facility] Phone: [phone] Fax:  [phone]

## 2022-01-21 NOTE — Progress Notes (Unsigned)
Name: Beverly [name]  DOB: 1980-07-23 MRN: [id number] PCP: Pcp, No     Brief Narrative:  NITISHA CIVELLO is a 41 y.o. 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 Dx ~2005.  CD4 nadir < 35 HIV Risk: sexual  History of OIs:  Intake Labs : Hep B sAg (- 2010), sAb (- 2010), cAb (- 2010); Hep A (), Hep C (- 2010) Quantiferon () HLA B*5701 (-) G6PD: ()   Previous Regimens: Atripla Kaletra + Combivir  Darunavir + Ritonavir + Truvada  Tivicay + Descovy ~2018 Biktarvy    Genotypes: Previously wildtype  12-2019: pending  Subjective:  CC: Wants to get back in care      HPI: LOV in 2022. Salene has only come in once every 1-2 years for check up and has not consistently been on her HIV medication.  Labs in August 2022: HIV RNA - 101,000 copies, CD4 - <35.   Has been in and out of ER since May 2023 with concerns over her chronic skin lesions and neuropathy. Noted in ER visit that she was living with her brother in [place] and having transportation problems. She was given short course of oxycocone IR and doxycycline PO     Review of Systems  Constitutional:  Positive for chills, fever and weight loss. Negative for malaise/fatigue.  Respiratory:  Negative for cough, sputum production and shortness of breath.   Cardiovascular:  Negative for chest pain and leg swelling.  Gastrointestinal:  Negative for abdominal pain, blood in stool, diarrhea, nausea and vomiting.       Anorexia   Genitourinary:  Negative for dysuria and frequency.  Musculoskeletal:  Negative for back pain, joint pain, myalgias and neck pain.  Skin:  Positive for itching and rash.  Neurological:  Negative for headaches.  Psychiatric/Behavioral:  Positive for depression. Negative for substance abuse. The patient is nervous/anxious and has insomnia.     Past Medical History:  Diagnosis Date   Abscess    AIDS ([place]) 03/19/2015   Anxiety    Asthma    Blood transfusion without reported diagnosis    Depression     HIV infection ([place])    Homeless 03/19/2015   states stays with family or friends. Does not rent or own a home, but does not live on streets.   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     bictegravir-emtricitabine-tenofovir AF (BIKTARVY) 50-200-25 MG TABS tablet Take 1 tablet by mouth daily. 30 tablet 3   diphenhydrAMINE (BENADRYL ALLERGY) 25 mg capsule Take 1 capsule (25 mg total) by mouth every 6 (six) hours as needed. (Patient not taking: Reported on 01/24/2021) 15 capsule 0   doxycycline (VIBRAMYCIN) 100 MG capsule Take 1 capsule (100 mg total) by mouth 2 (two) times daily. 20 capsule 0   ferrous sulfate 325 (65 FE) MG EC tablet Take 1 tablet (325 mg total) by mouth 2 (two) times daily. (Patient not taking: Reported on 10/26/2021) 180 tablet 1   gabapentin (NEURONTIN) 300 MG capsule Take 1 capsule (300 mg total) by mouth 2 (two) times daily. (Patient not taking: Reported on 01/24/2021) 60 capsule 3   oxyCODONE (  ROXICODONE) 5 MG immediate release tablet Take 1 tablet (5 mg total) by mouth every 6 (six) hours as needed for up to 15 doses for breakthrough pain. 15 tablet 0   senna-docusate (SENOKOT-S) 8.6-50 MG tablet Take 1 tablet by mouth 2 (two) times daily between meals as needed for mild constipation. (Patient not taking: Reported on 10/26/2021) 60 tablet 0   vitamin B-12 1000 MCG tablet Take 1 tablet (1,000 mcg total) by mouth daily. (Patient not taking: Reported on 10/26/2021)     No facility-administered medications prior to visit.     Allergies  Allergen Reactions   Bee Venom Anaphylaxis, Shortness Of Breath and Swelling   Ferrlecit [Na Ferric Gluc Cplx In  Sucrose] Swelling    Swelling and puffiness in hands and legs after Ferrlecit 250 mg infused over 60 minutes (possibly infusion related reaction)   Morphine And Related Hives and Itching    Just can't take "morphine", vicodin is OK    Social History   Tobacco Use   Smoking status: Every Day    Packs/day: 0.10    Years: 3.00    Total pack years: 0.30    Types: Cigarettes   Smokeless tobacco: Never   Tobacco comments:    1-2 a day  Vaping Use   Vaping Use: Never used  Substance Use Topics   Alcohol use: Yes    Alcohol/week: 0.0 standard drinks of alcohol    Comment: Ocassionally   Drug use: Yes    Frequency: 2.0 times per week    Types: Marijuana, MDMA (Ecstacy)    Comment: this morning     Family History  Problem Relation Age of Onset   Throat cancer Mother        smoker   Throat cancer Father        smoker   Hypertension Father    Cirrhosis Father    Hypertension Other    Cancer Other        smoker   Diabetes Other    Asthma Other     Social History   Substance and Sexual Activity  Sexual Activity Not Currently   Partners: Male   Birth control/protection: Condom, Surgical   Comment: pt. given condoms     Objective:   There were no vitals filed for this visit.  There is no height or weight on file to calculate BMI.  Physical Exam Constitutional:      General: She is not in acute distress.    Appearance: She is ill-appearing.     Comments: Thin, lost a lot of weight.   Eyes:     General: No scleral icterus.    Pupils: Pupils are equal, round, and reactive to light.  Cardiovascular:     Rate and Rhythm: Regular rhythm. Tachycardia present.     Pulses: Normal pulses.     Heart sounds: No murmur heard. Pulmonary:     Effort: Pulmonary effort is normal.     Breath sounds: Normal breath sounds.  Abdominal:     Palpations: Abdomen is soft.     Tenderness: There is abdominal tenderness (lower abdomen and pubic area).       Comments: Skin is  macerated with greyish appearance. Few scattered opened ulcerations without induration or fluctuance.   Her mons pubis is swollen as is labia today. No evidence of abscess as this is all very soft.   Genitourinary:    Pubic Area: Rash present.       Comments: Areas of chronic  ulcerations. Macerated skin with greyish appearance.  Musculoskeletal:     Cervical back: Normal range of motion. No rigidity.  Lymphadenopathy:     Cervical: Cervical adenopathy present.  Skin:    Capillary Refill: Capillary refill takes less than 2 seconds.     Comments: Multiple dark nodular lesions and open non-healing ulcerations on the legs, thighs and buttocks.   Neurological:     Mental Status: She is alert and oriented to person, place, and time.  Psychiatric:     Comments: Tearful, anxious            Lab Results Lab Results  Component Value Date   WBC 2.1 (L) 10/26/2021   HGB 10.6 (L) 10/26/2021   HCT 32.2 (L) 10/26/2021   MCV 85.0 10/26/2021   PLT 152 10/26/2021    Lab Results  Component Value Date   CREATININE 0.73 10/26/2021   BUN 24 (H) 10/26/2021   NA 141 10/26/2021   K 3.0 (L) 10/26/2021   CL 113 (H) 10/26/2021   CO2 22 10/26/2021    Lab Results  Component Value Date   ALT 20 10/26/2021   AST 37 10/26/2021   ALKPHOS 86 10/26/2021   BILITOT 1.0 10/26/2021    Lab Results  Component Value Date   CHOL 146 06/10/2016   HDL 72 06/10/2016   LDLCALC 63 06/10/2016   TRIG 56 06/10/2016   CHOLHDL 2.0 06/10/2016   HIV 1 RNA Quant (copies/mL)  Date Value  01/24/2021 101,000  08/26/2020 331,000 (H)  12/26/2019 60,200 (H)   CD4 T Cell Abs (/uL)  Date Value  01/23/2021 <35 (L)  08/27/2020 <35 (L)  12/26/2019 <35 (L)     Assessment & Plan:   Patient Active Problem List   Diagnosis Date Noted   Vomiting and diarrhea 01/23/2021   Hypomagnesemia 01/23/2021   Nausea vomiting and diarrhea 01/23/2021   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   Genital  herpes simplex    Nonintractable headache    Generalized lymphadenopathy    Skin rash associated with AIDS ([place]) 08/27/2020   Cellulitis of abdominal wall    Weakness 08/26/2020   HIV disease ([place])    Weight loss    Nausea & vomiting 05/25/2018   Genital ulcer, female 03/10/2018   Itching 03/10/2018   History of 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/11/2016   Pancytopenia ([place]) 01/09/2016   Non-healing skin lesion 05/06/2015   Homeless 03/19/2015   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   Hypokalemia 08/13/2014   Depression 10/17/2013   Anemia 10/17/2013   Thrombocytopenia ([place]) 10/17/2013   Cigarette smoker 10/17/2013   Asthma 10/17/2013   Urinary tract infection with hematuria 10/16/2013   Encounter for general adult medical examination without abnormal findings 06/12/2011   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

## 2022-01-22 ENCOUNTER — Encounter: Payer: Self-pay | Admitting: Infectious Diseases

## 2022-01-22 ENCOUNTER — Ambulatory Visit: Payer: Medicaid Other | Admitting: Infectious Diseases

## 2022-01-22 ENCOUNTER — Other Ambulatory Visit: Payer: Self-pay

## 2022-01-22 VITALS — BP 126/82 | HR 102 | Temp 98.2°F | Resp 16 | Ht 61.0 in | Wt 119.0 lb

## 2022-01-22 DIAGNOSIS — L98499 Non-pressure chronic ulcer of skin of other sites with unspecified severity: Secondary | ICD-10-CM | POA: Diagnosis not present

## 2022-01-22 DIAGNOSIS — B20 Human immunodeficiency virus [HIV] disease: Secondary | ICD-10-CM | POA: Diagnosis present

## 2022-01-22 DIAGNOSIS — G8929 Other chronic pain: Secondary | ICD-10-CM

## 2022-01-22 DIAGNOSIS — G44219 Episodic tension-type headache, not intractable: Secondary | ICD-10-CM | POA: Diagnosis not present

## 2022-01-22 DIAGNOSIS — L989 Disorder of the skin and subcutaneous tissue, unspecified: Secondary | ICD-10-CM

## 2022-01-22 DIAGNOSIS — G63 Polyneuropathy in diseases classified elsewhere: Secondary | ICD-10-CM

## 2022-01-22 DIAGNOSIS — R634 Abnormal weight loss: Secondary | ICD-10-CM

## 2022-01-22 MED ORDER — SULFAMETHOXAZOLE-TRIMETHOPRIM 800-160 MG PO TABS: 1.0000 | ORAL_TABLET | Freq: Two times a day (BID) | ORAL | 0 refills | Status: DC

## 2022-01-22 MED ORDER — PREGABALIN 25 MG PO CAPS: 25.0000 mg | ORAL_CAPSULE | Freq: Two times a day (BID) | ORAL | 0 refills | Status: DC

## 2022-01-22 MED ORDER — OXYCODONE HCL 5 MG PO TABS: 5.0000 mg | ORAL_TABLET | Freq: Four times a day (QID) | ORAL | 0 refills | Status: AC | PRN

## 2022-01-22 NOTE — Assessment & Plan Note (Signed)
With anorexia - possible some food resource contributor as well. Will refer to [facility] for resource support.  Differential - cachectic with advanced HIV/AIDS, undiagnosed malignancy (had severe CIN/VIN on biopsy without follow up interventions vs skin carcinoma on lower abdominal wall), opportunistic infection yet to be diagnosed.   Will have her back in 77mto follow up. Would probably benefit from mirtazapine or megace to help with appetite stimulation and malnutrition.

## 2022-01-22 NOTE — Assessment & Plan Note (Addendum)
Neck soreness that is reproducible on exam with new frontotemporal headaches in severely immunocompromised patient. She is a little tight/limited with lateral rotation. 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 Discussed that if her serum Ag is positive it will be recommendation to go to the hospital for treatment.

## 2022-01-22 NOTE — Assessment & Plan Note (Signed)
Advanced with progression from 1 year ago with new unintentional weight loss of nearly 30 # over the last 2 months, headaches/neck pain and worsening skin breakdown.   Plan to draw Crypto Ag today then if negative start back Biktarvy daily. She will go ahead and start bactrim back daily now.   FU in 53mor sooner pending results.

## 2022-01-22 NOTE — Assessment & Plan Note (Signed)
Intolerant of the gabapentin - will trial lyrica 25 mg daily x 7d then increase to BID. May benefit from from a different agent. Discussed the etiology of neuropathy - may improve getting HIV infection under better control but unlikely to resolve completely.  Does not sound like ETOH is playing a role.   I told her I think it would be in her best interest to consider leave from work. She may be a candidate for disability as well given advanced HIV/AIDS and neuropathy. Will refer to [facility] for assistance.

## 2022-01-22 NOTE — Patient Instructions (Addendum)
For the Nerve pain -  START the lyrica once a day for about a week - can increase to 2 times a day if you do OK with it. We will need to increase the dose for this   Will also refill a supply of the oxycodone for severe pain that is unrelieved from the back and body over the counter medicine.    I think it may be most helpful to find a pain clinic in [place] to help with your pain and find the correct safest medications for you.    Please stop by the lab on your way out - we need to wait on your blood work results first before we can start the biktarvy back. We will call you with results once they are back and next steps.   Go ahead and START the sulfamethoxazole (antibiotic) once a day.   I think we need to get you a referral to dermatology for your lower stomach wound.   I also would like to refer you to [facility] case manager team to help with your other needs regarding disability, housing and transportation.

## 2022-01-22 NOTE — Addendum Note (Signed)
Addended by: [place] Callas on: 01/22/2022 11:06 AM   Modules accepted: Orders

## 2022-01-22 NOTE — Assessment & Plan Note (Signed)
Chronic (years) in nature; though this has evolved since I last saw it and has some protrusion/mounded appearance of tissue. I am not sure if this is hypergranulation tissue (overall wound appearance is macerated) but with the severe pain she describes I think we need to biopsy and rule out malignancy. Will place referral to dermatology.   Pain management discussed - will refill 5d supply of oxycodone to use PRN for sever pain unrelieved by OTC methods. Dressing supplies provided with vasaline gauze and 4x4s to keep folded in between site. No cellulitis today and no local infection signs after recent course of doxycycline.

## 2022-01-23 ENCOUNTER — Telehealth: Payer: Self-pay

## 2022-01-23 LAB — T-HELPER CELLS (CD4) COUNT (NOT AT ARMC)
CD4 % Helper T Cell: 2 % — ABNORMAL LOW (ref 33–65)
CD4 T Cell Abs: <35 /uL — ABNORMAL LOW (ref 400–1790)

## 2022-01-23 MED ORDER — BICTEGRAVIR-EMTRICITAB-TENOFOV 50-200-25 MG PO TABS: 1.0000 | ORAL_TABLET | Freq: Every day | ORAL | 5 refills | Status: DC

## 2022-01-23 NOTE — Telephone Encounter (Signed)
Called patient regarding lab results. Was able to connect patient and relay providers message. Requested she restart Biktarvy today. Verbalized understanding. Leatrice Jewels, RMA

## 2022-01-23 NOTE — Progress Notes (Signed)
Her Cryptococcal antigen is negative - we can send in the biktarvy to be resumed for her. Will you please call to let her know she can start taking this? Thank you

## 2022-01-23 NOTE — Telephone Encounter (Signed)
-----   Message from [place] Callas, NP sent at 01/23/2022  3:02 PM EDT ----- Her Cryptococcal antigen is negative - we can send in the biktarvy to be resumed for her. Will you please call to let her know she can start taking this? Thank you

## 2022-01-23 NOTE — Addendum Note (Signed)
Addended by: [place] Callas on: 01/23/2022 03:03 PM   Modules accepted: Orders

## 2022-02-01 LAB — HIV RNA, RTPCR W/R GT (RTI, PI,INT)
HIV 1 RNA Quant: 416000 copies/mL — ABNORMAL HIGH
HIV-1 RNA Quant, Log: 5.62 Log copies/mL — ABNORMAL HIGH

## 2022-02-01 LAB — BASIC METABOLIC PANEL
BUN: 18 mg/dL (ref 7–25)
CO2: 25 mmol/L (ref 20–32)
Calcium: 8.6 mg/dL (ref 8.6–10.2)
Chloride: 109 mmol/L (ref 98–110)
Creat: 0.65 mg/dL (ref 0.50–0.97)
Glucose, Bld: 100 mg/dL — ABNORMAL HIGH (ref 65–99)
Potassium: 3.6 mmol/L (ref 3.5–5.3)
Sodium: 141 mmol/L (ref 135–146)

## 2022-02-01 LAB — CRYPTOCOCCAL AG, LTX SCR RFLX TITER
Cryptococcal Ag Screen: NOT DETECTED
MICRO NUMBER:: 13733536
SPECIMEN QUALITY:: ADEQUATE

## 2022-02-01 LAB — HIV-1 GENOTYPE: HIV-1 Genotype: DETECTED — AB

## 2022-02-01 LAB — HIV-1 INTEGRASE GENOTYPE

## 2022-02-19 ENCOUNTER — Ambulatory Visit: Payer: Medicaid Other | Admitting: Infectious Diseases

## 2022-02-24 ENCOUNTER — Ambulatory Visit: Payer: Medicaid Other | Admitting: Infectious Diseases

## 2022-02-25 ENCOUNTER — Other Ambulatory Visit (HOSPITAL_COMMUNITY)
Admission: RE | Admit: 2022-02-25 | Discharge: 2022-02-25 | Disposition: A | Discharge status: Home / Self Care | Payer: Medicaid Other | Source: Ambulatory Visit | Attending: Infectious Diseases | Admitting: Infectious Diseases

## 2022-02-25 ENCOUNTER — Encounter: Payer: Self-pay | Admitting: Infectious Diseases

## 2022-02-25 ENCOUNTER — Other Ambulatory Visit: Payer: Self-pay

## 2022-02-25 ENCOUNTER — Ambulatory Visit: Payer: Medicaid Other | Admitting: Infectious Diseases

## 2022-02-25 VITALS — BP 129/88 | HR 105 | Temp 97.6°F | Ht 61.0 in | Wt 120.0 lb

## 2022-02-25 DIAGNOSIS — M79605 Pain in left leg: Secondary | ICD-10-CM

## 2022-02-25 DIAGNOSIS — B20 Human immunodeficiency virus [HIV] disease: Secondary | ICD-10-CM

## 2022-02-25 DIAGNOSIS — L989 Disorder of the skin and subcutaneous tissue, unspecified: Secondary | ICD-10-CM | POA: Diagnosis not present

## 2022-02-25 DIAGNOSIS — Z113 Encounter for screening for infections with a predominantly sexual mode of transmission: Secondary | ICD-10-CM | POA: Diagnosis not present

## 2022-02-25 DIAGNOSIS — M79604 Pain in right leg: Secondary | ICD-10-CM | POA: Diagnosis not present

## 2022-02-25 NOTE — Progress Notes (Addendum)
[place], [place], [place], [address]                                                                  Phn. [phone]; Fax: [phone]                                                                             Date: 02/25/22 Reason for Visit: HIV/AIDS  HPI: Beverly [name] is a 41 y.o.old female with a history of AIDs, h/o non compliance. Neuropathy, Kaposi sarcoma, Anxiety/Depression, Smoking who is here for follow up.   Last seen on 01/22/22. On Biktarvy and bactrim.  Interval hx/current visit: Claims she has been taking Biktarvy daily, denies missing doses. Also tells taking Bactrim one tablet daily, not BID. She started breaking out all around her body approx 1.5 week ago. She also had some flea bite recently. She tells me she has h/o eczema. Some of the lesions in her UE and LE was previously present and had closed up but started opening back. Denies new medications or creams/lotion except pregabalin and asking if it could be contributing to the breaking out. Denies fevers and chillls. She has nausea and NBNB vomiting a week ago but that has resolved. Her appetite is poor but surprised that she is gaining weight. She had swelling in her both legs few days ago at work, it was associated with pain then, when she came back home, wrapped it around and got better. However, she still continues to have pain in her legs. She initially had nausea and stomach upset with pregabalin given for leg pain but  its gone. She has not seen dermatology yet for the non healing lesion in the lower abdomen not Gyn for CIN. Denies any tenderness or drainage from the non healing lesion in the suprapubic region.   ROS: As stated in above HPI; all other systems were reviewed and are otherwise negative unless noted  below  No reported fever / chills, night sweats, unintentional weight loss, acute visual change, odynophagia, chest pain/pressure, new or worsened SOB or WOB, nausea, vomiting, diarrhea, dysuria, GU discharge, syncope, seizures, red/hot swollen joints, hallucinations / delusions, rashes, unusual / excessive bleeding, swollen lymph nodes, or new hospitalizations/ED visits/Urgent Care visits since the pt was last seen.  PMH/ PSH/ FamHx / Social Hx , medications and allergies reviewed and updated as appropriate; please see corresponding tab in EHR / prior notes  Current Outpatient Medications on File Prior to Visit  Medication Sig Dispense Refill   acetaminophen (TYLENOL) 500 MG tablet Take 500 mg by mouth every 6 (six) hours as needed for moderate pain.     bictegravir-emtricitabine-tenofovir AF (BIKTARVY) 50-200-25 MG TABS tablet Take 1 tablet by mouth daily. Try to take at the same time each day with or without food. 30 tablet 5   pregabalin (LYRICA) 25 MG capsule Take 1 capsule (25 mg total) by mouth 2 (two) times daily. 60 capsule 0   sulfamethoxazole-trimethoprim (BACTRIM DS) 800-160 MG tablet Take 1 tablet by mouth 2 (two) times daily. 30 tablet 0   diphenhydrAMINE (BENADRYL ALLERGY) 25 mg capsule Take 1 capsule (25 mg total) by mouth every 6 (six) hours as needed. (Patient not taking: Reported on 01/24/2021) 15 capsule 0   senna-docusate (SENOKOT-S) 8.6-50 MG tablet Take 1 tablet by mouth 2 (two) times daily between meals as needed for mild constipation. (Patient not taking: Reported on 10/26/2021) 60 tablet 0   vitamin B-12 1000 MCG tablet Take 1 tablet (1,000 mcg total) by mouth daily. (Patient not taking: Reported on 10/26/2021)     No current facility-administered medications on file prior to visit.     Allergies  Allergen Reactions   Bee Venom Anaphylaxis, Shortness Of Breath and Swelling   Ferrlecit [Na Ferric Gluc Cplx In Sucrose] Swelling     Swelling and puffiness in hands and legs after Ferrlecit 250 mg infused over 60 minutes (possibly infusion related reaction)   Morphine And Related Hives and Itching    Just can't take "morphine", vicodin is OK   Past Medical History:  Diagnosis Date   Abscess    AIDS ([place]) 03/19/2015   Anxiety    Asthma    Blood transfusion without reported diagnosis    Depression    HIV infection ([place])    Homeless 03/19/2015   states stays with family or friends. Does not rent or own a home, but does not live on streets.   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   feet/ hands   Skin ulcer ([place]) 05/06/2015   Past Surgical History:  Procedure Laterality Date   CERVICAL CONIZATION W/BX N/A 04/14/2018   Procedure: COLD KNIFE CONIZATION CERVIX WITH BIOPSY;  Surgeon: Isabel Caprice, MD;  Location: [facility];  Service: Gynecology;  Laterality: N/A;   CESAREAN SECTION     DILATION AND CURETTAGE OF UTERUS N/A 04/14/2018   Procedure: ENDOCERVICAL CURETTAGE;  Surgeon: Isabel Caprice, MD;  Location: [facility];  Service: Gynecology;  Laterality: N/A;   INCISION AND DRAINAGE ABSCESS Right 01/10/2016   Procedure: INCISION AND DRAINAGE RIGHT BUTTOCK, LEFT LABIAL , EXCISION AND DRAINAGE OF  RIGHT AXILLARY ABSCESS;  Surgeon: Excell Seltzer, MD;  Location: [facility];  Service: General;  Laterality: Right;   TUBAL LIGATION  2005   VULVA Milagros Loll BIOPSY N/A 04/14/2018   Procedure: VULVAR BIOPSIES;  Surgeon: Isabel Caprice, MD;  Location[facility];  Service: Gynecology;  Laterality: N/A;    Social History   Socioeconomic History   Marital status: Single    Spouse name: Not on file   Number of children: Not on file   Years of education: Not on file   Highest education level: Not on file  Occupational History   Not on file  Tobacco Use   Smoking  status: Every Day    Packs/day: 0.10    Years: 3.00    Total pack years: 0.30    Types: Cigarettes   Smokeless tobacco: Never   Tobacco comments:    1-2 a day  Vaping Use   Vaping Use: Never used  Substance and Sexual Activity   Alcohol use: Yes    Alcohol/week: 0.0 standard drinks of alcohol    Comment: Ocassionally   Drug use: Yes    Frequency: 2.0 times per week    Types: Marijuana, MDMA (Ecstacy)   Sexual activity: Not Currently    Partners: Male    Birth control/protection: Condom, Surgical    Comment: pt. given condoms  Other Topics Concern   Not on file  Social History Narrative   Not on file   Social Determinants of Health   Financial Resource Strain: Not on file  Food Insecurity: Not on file  Transportation Needs: Not on file  Physical Activity: Not on file  Stress: Not on file  Social Connections: Not on file  Intimate Partner Violence: Not on file   Family History  Problem Relation Age of Onset   Throat cancer Mother        smoker   Throat cancer Father        smoker   Hypertension Father    Cirrhosis Father    Hypertension Other    Cancer Other        smoker   Diabetes Other    Asthma Other     Vitals BP 129/88   Pulse (!) 105   Temp 97.6 F (36.4 C) (Oral)   Ht '5\' 1"'$  (1.549 m)   Wt 120 lb (54.4 kg)   BMI 22.67 kg/m   Examination  Gen: no acute distress, restless HEENT: [place]/AT, no scleral icterus, no pale conjunctivae, hearing normal, oral mucosa moist Neck: Supple Cardio: Regular rate and rhythm Resp: Pulmonary effort normal in room air GI: nondistended GU: Musc: Extremities: bilateral swelling in lower extremities with no signs of cellulitis  Skin: scattered multiple acute and chronic lesions in the extremities, trunk, suprapubic chronic non healing ulcer with no signs of cellulitis, excoriations +                  Neuro: grossly non focal , awake, alert and oriented * 3  Psych: Calm, cooperative    Lab  Results HIV 1 RNA Quant (copies/mL)  Date Value  01/22/2022 416,000 (H)  01/24/2021 101,000  08/26/2020 331,000 (H)   CD4 T Cell Abs (/uL)  Date Value  01/22/2022 <35 (L)  01/23/2021 <35 (L)  08/27/2020 <35 (L)   No results found for: "HIV1GENOSEQ" Lab Results  Component Value Date   WBC 2.1 (L) 10/26/2021   HGB 10.6 (L) 10/26/2021   HCT 32.2 (L) 10/26/2021   MCV 85.0 10/26/2021   PLT 152 10/26/2021    Lab Results  Component Value Date   CREATININE 0.65 01/22/2022   BUN 18 01/22/2022   NA 141 01/22/2022   K 3.6 01/22/2022   CL 109 01/22/2022   CO2 25 01/22/2022   Lab Results  Component Value Date   ALT 20 10/26/2021   AST 37 10/26/2021   ALKPHOS 86 10/26/2021   BILITOT 1.0 10/26/2021    Lab Results  Component Value Date   CHOL 146 06/10/2016   TRIG 56 06/10/2016   HDL 72 06/10/2016   LDLCALC 63 06/10/2016   No results found for: "HAV" Lab Results  Component Value  Date   HEPBSAG NON REACTIVE 08/27/2020   HEPBSAB NEG 06/28/2008   Lab Results  Component Value Date   HCVAB NON REACTIVE 08/27/2020   Lab Results  Component Value Date   CHLAMYDIAWP Negative 08/27/2020   N Negative 08/27/2020   No results found for: "GCPROBEAPT" No results found for: "QUANTGOLD"    Health Maintenance: Immunization History  Administered Date(s) Administered   Hepatitis B 07/30/1998   Influenza Whole 06/28/2008, 02/19/2011   Influenza,inj,Quad PF,6+ Mos 03/05/2015, 06/24/2016, 03/10/2018   Meningococcal Mcv4o 06/24/2016, 03/10/2018   PPD Test 03/15/2015   Pneumococcal Polysaccharide-23 06/28/2008, 05/06/2015    Assessment/Plan: AIDS, uncontrolled  Continue Biktarvy and bactrim daily as is. No concerns for crypto meningitis. Crypto ag ordered for eval of skin lesions Labs today  Encouraged compliance Fu as below   Acute and chronic lesions in multiple area of the body In the setting of flea bite, pregabalin use, h/o eczema and AIDS Will get crypto ag,  blastomyces and histoplasma ag, fungal and AFB blood cultures due to AIDS and possibility of opportunistic infections presenting as skin lesions Discussed to stop pregabalin for 1 week and see if any improvement, although less likely this is the cause.   Chronic Non healing lesion in the suprapubic area R/o malignancy vs related to opportunistic infections  Labs as above  Already Referred to dermatology for biopsy Care coordination has been difficult for her due to inconsistent follow ups.   HSIL/LSIL ( CIN) She has not followed up with Gyn. Discussed about Fu with Gyn   Neuropathy/Bilateral leg pain  Referral to pain medicine Continue ART   Smoking Counseled   STD Screening  RPR and urine GC  #Immunization  #Health maintenance Lipid panel today  could  not be completed due to limitation in time to be discussed in subsequent visits  Note: discussed with patient to go to ED in case of worsening symptoms or rashes.   [name] labs were reviewed as well as his previous records. Patients questions were addressed and answered. Safe sex counseling done.  I have personally spent 45 minutes involved in face-to-face and non-face-to-face activities for this patient on the day of the visit. Professional time spent includes the following activities: Preparing to see the patient (review of tests), Obtaining and/or reviewing separately obtained history (admission/discharge record), Performing a medically appropriate examination and/or evaluation , Ordering medications/tests/procedures, referring and communicating with other health care professionals, Documenting clinical information in the EMR, Independently interpreting results (not separately reported), Communicating results to the patient/family/caregiver, Counseling and educating the patient/family/caregiver and Care coordination (not separately reported).    Electronically signed by:  Rosiland Oz, MD Infectious Disease  [facility] [address] [place], [place] [address] Phone: [phone]  Fax: [phone]

## 2022-02-26 LAB — URINE CYTOLOGY ANCILLARY ONLY
Chlamydia: NEGATIVE
Comment: NEGATIVE
Comment: NORMAL
Neisseria Gonorrhea: NEGATIVE

## 2022-02-27 ENCOUNTER — Other Ambulatory Visit: Payer: Self-pay

## 2022-02-27 DIAGNOSIS — L989 Disorder of the skin and subcutaneous tissue, unspecified: Secondary | ICD-10-CM

## 2022-03-02 ENCOUNTER — Other Ambulatory Visit: Payer: Medicaid Other

## 2022-03-02 ENCOUNTER — Other Ambulatory Visit: Payer: Self-pay

## 2022-03-02 DIAGNOSIS — L989 Disorder of the skin and subcutaneous tissue, unspecified: Secondary | ICD-10-CM

## 2022-03-06 LAB — HISTOPLASMA ANTIGEN, URINE: Histoplasma Antigen, urine: <0.2 ng/mL

## 2022-03-12 LAB — LIPID PANEL
Cholesterol: 108 mg/dL (ref <200)
HDL: 43 mg/dL — ABNORMAL LOW (ref >=50)
LDL Cholesterol (Calc): 44 mg/dL (calc)
Non-HDL Cholesterol (Calc): 65 mg/dL (calc) (ref <130)
Total CHOL/HDL Ratio: 2.5 (calc) (ref <5.0)
Triglycerides: 119 mg/dL (ref <150)

## 2022-03-12 LAB — RPR: RPR Ser Ql: NONREACTIVE

## 2022-03-12 LAB — HIV RNA, RTPCR W/R GT (RTI, PI,INT)
HIV 1 RNA Quant: 1000 copies/mL — ABNORMAL HIGH
HIV-1 RNA Quant, Log: 3 Log copies/mL — ABNORMAL HIGH

## 2022-03-12 LAB — CRYPTOCOCCAL AG, LTX SCR RFLX TITER
Cryptococcal Ag Screen: NOT DETECTED
MICRO NUMBER:: 13878975
SPECIMEN QUALITY:: ADEQUATE

## 2022-03-12 LAB — HIV-1 GENOTYPE: HIV-1 Genotype: DETECTED — AB

## 2022-03-12 LAB — BLASTOMYCES AB, ID: Blastomyces Abs, Qn, DID: NEGATIVE

## 2022-03-12 LAB — HIV-1 INTEGRASE GENOTYPE

## 2022-03-16 ENCOUNTER — Ambulatory Visit: Payer: Medicaid Other | Admitting: Infectious Diseases

## 2022-03-16 DIAGNOSIS — B20 Human immunodeficiency virus [HIV] disease: Secondary | ICD-10-CM

## 2022-03-16 MED ORDER — SULFAMETHOXAZOLE-TRIMETHOPRIM 800-160 MG PO TABS: 1.0000 | ORAL_TABLET | Freq: Every day | ORAL | 0 refills | Status: DC

## 2022-03-17 ENCOUNTER — Ambulatory Visit: Payer: Medicaid Other | Admitting: Infectious Diseases

## 2022-03-20 ENCOUNTER — Ambulatory Visit: Payer: Medicaid Other | Admitting: Infectious Diseases

## 2022-04-09 ENCOUNTER — Ambulatory Visit: Payer: Medicaid Other | Admitting: Infectious Diseases

## 2022-04-17 ENCOUNTER — Ambulatory Visit: Payer: Medicaid Other | Admitting: Infectious Diseases

## 2022-05-05 ENCOUNTER — Ambulatory Visit: Payer: Medicaid Other | Admitting: Infectious Diseases

## 2022-05-12 ENCOUNTER — Ambulatory Visit: Payer: Medicaid Other | Admitting: Infectious Diseases

## 2022-10-19 ENCOUNTER — Encounter: Payer: Self-pay | Admitting: Infectious Diseases

## 2022-10-19 ENCOUNTER — Ambulatory Visit: Payer: Medicaid Other | Admitting: Infectious Diseases

## 2022-10-19 ENCOUNTER — Other Ambulatory Visit: Payer: Self-pay

## 2022-10-19 VITALS — BP 112/73 | HR 108 | Temp 98.2°F | Ht 61.0 in | Wt 120.0 lb

## 2022-10-19 DIAGNOSIS — F32A Depression, unspecified: Secondary | ICD-10-CM

## 2022-10-19 DIAGNOSIS — F419 Anxiety disorder, unspecified: Secondary | ICD-10-CM

## 2022-10-19 DIAGNOSIS — B20 Human immunodeficiency virus [HIV] disease: Secondary | ICD-10-CM | POA: Diagnosis not present

## 2022-10-19 MED ORDER — SULFAMETHOXAZOLE-TRIMETHOPRIM 800-160 MG PO TABS
1.0000 | ORAL_TABLET | Freq: Every day | ORAL | 11 refills | Status: DC
Start: 2022-10-19 — End: 2023-02-16

## 2022-10-19 MED ORDER — BICTEGRAVIR-EMTRICITAB-TENOFOV 50-200-25 MG PO TABS
1.00 | ORAL_TABLET | Freq: Every day | ORAL | 11 refills | Status: DC
Start: 2022-10-19 — End: 2022-12-28

## 2022-10-19 MED ORDER — "PETROLEUM GAUZE NON-WOVEN 3X9"" EX MISC": 1.0000 | Freq: Every day | CUTANEOUS | 1 refills | Status: DC

## 2022-10-19 MED ORDER — AZITHROMYCIN 600 MG PO TABS: 1200.0000 mg | ORAL_TABLET | ORAL | 0 refills | Status: DC

## 2022-10-19 MED ORDER — HYDROXYZINE HCL 25 MG PO TABS: 25.0000 mg | ORAL_TABLET | Freq: Three times a day (TID) | ORAL | 0 refills | Status: DC | PRN

## 2022-10-19 NOTE — Assessment & Plan Note (Signed)
Severe anxiety - not sleeping at all. Will prioritize a sleep plan for her. Options discussed and will start hydroxyzine for her 25 mg TID PRN with 25-50 mg dose in the PM for sleep assistance.  Can refer to counseling services as well if she agrees at follow up. Unfortunately we are currently without a counselor here at [facility].

## 2022-10-19 NOTE — Patient Instructions (Addendum)
Please continue the Biktarvy and Bactrim everyday.   Will start some hydroxyzlne for you at night and as needed during the day for anxiety. If you can get some improved sleep at night I am hopeful it will help you during the day. Can take 1 pill during the day as needed every 8 hours. Can take 1-2 at night for sleep support.   I would like to see you back in 1 month please   [facility]  Address: [address], [place], [place] [address] Open ? Closes 6?PM Phone: [phone]  [facility]  [address]  [phone] Open ? Closes 6?PM

## 2022-10-19 NOTE — Assessment & Plan Note (Signed)
Longstanding history of poorly controlled HIV with AIDS. She needs support, psychiatry care, transportation/housing resources. She spent time meeting with Kara Mead today with [facility]. Hopeful they can help with resources in [place] she can benefit from.  She would certainly qualify for disability given her advanced condition. I think this would take some financial stress of of her as well if she can navigate the application process with case manager.   Will refill biktarvy and bactrim for her to continue them both. Azithromycin for weekly 1200 mg dose MAC prevention.   Will have her back in 50m. She has no mutations present. May consider starting cabenuva + lencapivir for her as salvage parenteral regimen. Will discuss with pharmacy. Unfortunately no research protocols enrolling for her at present but maybe in September something she can benefit from will come. She is a little nervous at the thought of an injection but does think it may help her stay on track with one less thing to worry about during the day.   Will continue to discuss parenteral tx at follow up.

## 2022-10-19 NOTE — Progress Notes (Signed)
Name: Beverly [name]  DOB: 1981-05-19 MRN: [id number] PCP: Pcp, No     Brief Narrative:  Beverly [name] is a 42 y.o. 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 Dx ~2005.  CD4 nadir < 35 HIV Risk: sexual  History of OIs:  Intake Labs : Hep B sAg (- 2010), sAb (- 2010), cAb (- 2010); Hep A (), Hep C (- 2010) Quantiferon () HLA B*5701 (-) G6PD: ()   Previous Regimens: Atripla Kaletra + Combivir  Darunavir + Ritonavir + Truvada  Tivicay + Descovy ~2018 Biktarvy    Genotypes: Previously wildtype  12-2019: pending  Subjective:   Chief Complaint  Patient presents with   Follow-up       HPI: Beverly [name] is here for follow up care for HIV/AIDS. LOV in Sept 2023. Intermittent adherence with biktarvy treatment for many years. In September her viral load was under better control at 1000 copies. Persistent low CD4 count.   Since our last OV with her in Sept she has not had any hospital/ER encounters. Has been working a lot. Weight stable at 120 lbs. Had a cold recently, lost some weight and appetite but this seems to be recovering now. She knows she is not doing well on her biktarvy. Attributes poor adherence to depression, instability of housing/transportation, lack of social support and handling everything else with regards to child/grandchild care.   Back to her brother's house for now but still back and forth between [place] and [place] with brother and sister respectively. Trying to find a place. She has been working a lot as usual. Handling everything is a lot for her. She has had a lot of stress and often feels very overwhelmed with it all.  Has not slept more than a few hours over the last few days.   Requesting wound care supplies for nonhealing ulceration in the abdominal fold. This waxes and wanes. Had not seen dermatology for this.    Review of Systems  Constitutional:  Positive for malaise/fatigue. Negative for chills, fever and weight loss.  Respiratory: Negative.    Cardiovascular:  Negative.   Gastrointestinal:  Negative for abdominal pain, diarrhea, nausea and vomiting.  Genitourinary: Negative.   Musculoskeletal:  Positive for joint pain.  Skin:  Positive for rash.  Neurological:  Positive for tingling and sensory change.     Past Medical History:  Diagnosis Date   Abscess    AIDS ([facility]) 03/19/2015   Anxiety    Asthma    Blood transfusion without reported diagnosis    Depression    HIV infection ([facility])    Homeless 03/19/2015   states stays with family or friends. Does not rent or own a home, but does not live on streets.   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     diphenhydrAMINE (BENADRYL ALLERGY) 25 mg capsule Take 1 capsule (25 mg total) by mouth every 6 (six) hours as needed. (Patient not taking: Reported on 10/19/2022) 15 capsule 0   pregabalin (LYRICA) 25 MG capsule Take 1 capsule (25 mg total) by mouth 2 (two) times daily. (Patient not taking: Reported on 10/19/2022) 60 capsule 0   vitamin  B-12 1000 MCG tablet Take 1 tablet (1,000 mcg total) by mouth daily. (Patient not taking: Reported on 10/26/2021)     bictegravir-emtricitabine-tenofovir AF (BIKTARVY) 50-200-25 MG TABS tablet Take 1 tablet by mouth daily. Try to take at the same time each day with or without food. (Patient not taking: Reported on 10/19/2022) 30 tablet 5   senna-docusate (SENOKOT-S) 8.6-50 MG tablet Take 1 tablet by mouth 2 (two) times daily between meals as needed for mild constipation. (Patient not taking: Reported on 10/26/2021) 60 tablet 0   sulfamethoxazole-trimethoprim (BACTRIM DS) 800-160 MG tablet Take 1 tablet by mouth daily. (Patient not taking: Reported  on 10/19/2022) 30 tablet 0   No facility-administered medications prior to visit.     Allergies  Allergen Reactions   Bee Venom Anaphylaxis, Shortness Of Breath and Swelling   Ferrlecit [Na Ferric Gluc Cplx In Sucrose] Swelling    Swelling and puffiness in hands and legs after Ferrlecit 250 mg infused over 60 minutes (possibly infusion related reaction)   Morphine And Related Hives and Itching    Just can't take "morphine", vicodin is OK    Social History   Tobacco Use   Smoking status: Every Day    Packs/day: 0.10    Years: 3.00    Additional pack years: 0.00    Total pack years: 0.30    Types: Cigarettes   Smokeless tobacco: Never   Tobacco comments:    1-2 a day  Vaping Use   Vaping Use: Never used  Substance Use Topics   Alcohol use: Yes    Alcohol/week: 0.0 standard drinks of alcohol    Comment: Ocassionally   Drug use: Yes    Frequency: 2.0 times per week    Types: Marijuana, MDMA (Ecstacy)    Family History  Problem Relation Age of Onset   Throat cancer Mother        smoker   Throat cancer Father        smoker   Hypertension Father    Cirrhosis Father    Hypertension Other    Cancer Other        smoker   Diabetes Other    Asthma Other     Social History   Substance and Sexual Activity  Sexual Activity Not Currently   Partners: Male   Birth control/protection: Condom, Surgical   Comment: pt. given condoms     Objective:   Vitals:   10/19/22 1457 10/19/22 1539  BP: 112/73   Pulse: (!) 121 (!) 108  Temp: 98.2 F (36.8 C)   TempSrc: Oral   SpO2: 98%   Weight: 120 lb (54.4 kg)   Height: 5\' 1"  (1.549 m)     Body mass index is 22.67 kg/m.   Physical Exam Eyes:     Pupils: Pupils are equal, round, and reactive to light.  Cardiovascular:     Rate and Rhythm: Regular rhythm. Tachycardia present.  Pulmonary:     Effort: Pulmonary effort is normal.     Breath sounds: Normal breath sounds.  Abdominal:     General: Bowel sounds are  normal. There is no distension.     Palpations: Abdomen is soft.  Musculoskeletal:     Cervical back: Normal range of motion. No rigidity.  Lymphadenopathy:     Cervical: No cervical adenopathy.  Skin:    General: Skin is warm and dry.     Comments: Multiple non healing nodular / ulcerated lesions on arms, face and legs.   Neurological:  Mental Status: She is alert and oriented to person, place, and time.     Lab Results Lab Results  Component Value Date   WBC 2.1 (L) 10/26/2021   HGB 10.6 (L) 10/26/2021   HCT 32.2 (L) 10/26/2021   MCV 85.0 10/26/2021   PLT 152 10/26/2021    Lab Results  Component Value Date   CREATININE 0.65 01/22/2022   BUN 18 01/22/2022   NA 141 01/22/2022   K 3.6 01/22/2022   CL 109 01/22/2022   CO2 25 01/22/2022    Lab Results  Component Value Date   ALT 20 10/26/2021   AST 37 10/26/2021   ALKPHOS 86 10/26/2021   BILITOT 1.0 10/26/2021    Lab Results  Component Value Date   CHOL 108 02/25/2022   HDL 43 (L) 02/25/2022   LDLCALC 44 02/25/2022   TRIG 119 02/25/2022   CHOLHDL 2.5 02/25/2022   HIV 1 RNA Quant (copies/mL)  Date Value  02/25/2022 1,000 (H)  01/22/2022 416,000 (H)  01/24/2021 101,000   CD4 T Cell Abs (/uL)  Date Value  01/22/2022 <35 (L)  01/23/2021 <35 (L)  08/27/2020 <35 (L)     Assessment & Plan:   Patient Active Problem List   Diagnosis Date Noted   Hypomagnesemia 01/23/2021   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   Nonintractable headache    Generalized lymphadenopathy    Skin rash associated with AIDS ([facility]) 08/27/2020   Cellulitis of abdominal wall    HIV disease ([facility])    Weight loss    Nausea & vomiting 05/25/2018   Abdominal wall skin ulcer ([facility]) 03/10/2018   Itching 03/10/2018   History of 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/11/2016   Pancytopenia ([facility]) 01/09/2016   Non-healing skin lesion 05/06/2015   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 She needs support, psychiatry care, transportation/housing resources. She spent time meeting with Kara Mead today with [facility]. Hopeful they can help with resources in [place] she can benefit from.  She would certainly qualify for disability given her advanced condition. I think this would take some financial stress of of her as well if she can navigate the application process with case manager.   Will refill biktarvy and bactrim for her to continue them both. Azithromycin for weekly 1200 mg dose MAC prevention.   Will have her back in 54m. She has no mutations present. May consider starting cabenuva + lencapivir for her as salvage parenteral regimen. Will discuss with pharmacy. Unfortunately no research protocols enrolling for her at present but maybe in September something she can benefit from will come. She is a little nervous at the thought of an injection but does think it may help her stay on track with one less thing to worry about during the day.   Will continue to discuss parenteral tx at follow up.       Relevant Medications   bictegravir-emtricitabine-tenofovir AF (BIKTARVY) 50-200-25 MG TABS tablet  sulfamethoxazole-trimethoprim (BACTRIM DS) 800-160 MG tablet   azithromycin (ZITHROMAX) 600 MG tablet   Other Relevant Orders   Hepatitis B surface antibody,qualitative   Hepatitis B surface antigen   HIV 1 RNA quant-no reflex-bld   T-helper cells (CD4) count   Anxiety and depression    Severe anxiety -  not sleeping at all. Will prioritize a sleep plan for her. Options discussed and will start hydroxyzine for her 25 mg TID PRN with 25-50 mg dose in the PM for sleep assistance.  Can refer to counseling services as well if she agrees at follow up. Unfortunately we are currently without a counselor here at [facility].       Relevant Medications   hydrOXYzine (ATARAX) 25 MG tablet     Rexene Alberts, MSN, NP-C [facility] Pager: [phone] Office: [phone]  10/19/22  5:01 PM

## 2022-10-20 LAB — T-HELPER CELLS (CD4) COUNT (NOT AT ARMC)
CD4 % Helper T Cell: 1 % — ABNORMAL LOW (ref 33–65)
CD4 T Cell Abs: <35 /uL — ABNORMAL LOW (ref 400–1790)

## 2022-10-22 LAB — HIV-1 RNA QUANT-NO REFLEX-BLD
HIV 1 RNA Quant: 142000 Copies/mL — ABNORMAL HIGH
HIV-1 RNA Quant, Log: 5.15 Log cps/mL — ABNORMAL HIGH

## 2022-10-22 LAB — HEPATITIS B SURFACE ANTIBODY,QUALITATIVE: Hep B S Ab: NONREACTIVE

## 2022-10-22 LAB — HEPATITIS B SURFACE ANTIGEN: Hepatitis B Surface Ag: NONREACTIVE

## 2022-11-18 ENCOUNTER — Ambulatory Visit: Payer: Medicaid Other | Admitting: Infectious Diseases

## 2022-11-24 ENCOUNTER — Ambulatory Visit: Payer: Self-pay | Admitting: Infectious Diseases

## 2022-12-02 ENCOUNTER — Telehealth: Payer: Self-pay

## 2022-12-02 NOTE — Telephone Encounter (Signed)
Detectable Viral Load Intervention   Most recent VL:  HIV 1 RNA Quant  Date Value Ref Range Status  10/19/2022 142,000 (H) Copies/mL Final  02/25/2022 1,000 (H) copies/mL Final  01/22/2022 416,000 (H) copies/mL Final    Current ART regimen: Biktarvy  Appointment status: patient does not have future appointment scheduled   Called patient to discuss medication adherence and possible barriers to care.   Medication last dispensed (per chart review):   Dispensed Days Supply Quantity Provider Pharmacy  BIKTARVY 50/200/25MG  TABLETS 03/04/2022 30 30 each Blanchard Kelch, NP [facility]  BIKTARVY 50/200/25MG  TABLETS 01/26/2022 30 30 each Blanchard Kelch, NP [facility]    Medication Adherence   Unable to assess.    Barriers to Care   Unable to assess.      Interventions: called to schedule appointment, no answer. Left HIPAA compliant voicemail requesting callback.   Sandie Ano, RN

## 2022-12-20 ENCOUNTER — Inpatient Hospital Stay (HOSPITAL_BASED_OUTPATIENT_CLINIC_OR_DEPARTMENT_OTHER)
Admission: EM | Admit: 2022-12-20 | Discharge: 2022-12-28 | DRG: 603 | Disposition: A | Discharge status: Home / Self Care | Payer: Medicaid Other | Attending: Internal Medicine | Admitting: Internal Medicine

## 2022-12-20 ENCOUNTER — Other Ambulatory Visit: Payer: Self-pay

## 2022-12-20 DIAGNOSIS — Z597 Insufficient social insurance and welfare support: Secondary | ICD-10-CM

## 2022-12-20 DIAGNOSIS — B20 Human immunodeficiency virus [HIV] disease: Secondary | ICD-10-CM | POA: Diagnosis present

## 2022-12-20 DIAGNOSIS — L03314 Cellulitis of groin: Secondary | ICD-10-CM | POA: Diagnosis present

## 2022-12-20 DIAGNOSIS — N200 Calculus of kidney: Secondary | ICD-10-CM

## 2022-12-20 DIAGNOSIS — J45909 Unspecified asthma, uncomplicated: Secondary | ICD-10-CM | POA: Diagnosis present

## 2022-12-20 DIAGNOSIS — D72819 Decreased white blood cell count, unspecified: Secondary | ICD-10-CM | POA: Diagnosis present

## 2022-12-20 DIAGNOSIS — G629 Polyneuropathy, unspecified: Secondary | ICD-10-CM | POA: Diagnosis present

## 2022-12-20 DIAGNOSIS — Z808 Family history of malignant neoplasm of other organs or systems: Secondary | ICD-10-CM

## 2022-12-20 DIAGNOSIS — C469 Kaposi's sarcoma, unspecified: Secondary | ICD-10-CM | POA: Diagnosis present

## 2022-12-20 DIAGNOSIS — Z885 Allergy status to narcotic agent status: Secondary | ICD-10-CM

## 2022-12-20 DIAGNOSIS — F32A Depression, unspecified: Secondary | ICD-10-CM | POA: Diagnosis present

## 2022-12-20 DIAGNOSIS — L03311 Cellulitis of abdominal wall: Principal | ICD-10-CM | POA: Diagnosis present

## 2022-12-20 DIAGNOSIS — F419 Anxiety disorder, unspecified: Secondary | ICD-10-CM | POA: Diagnosis present

## 2022-12-20 DIAGNOSIS — E876 Hypokalemia: Secondary | ICD-10-CM | POA: Diagnosis present

## 2022-12-20 DIAGNOSIS — Z888 Allergy status to other drugs, medicaments and biological substances status: Secondary | ICD-10-CM

## 2022-12-20 DIAGNOSIS — Z91199 Patient's noncompliance with other medical treatment and regimen due to unspecified reason: Secondary | ICD-10-CM

## 2022-12-20 DIAGNOSIS — M79604 Pain in right leg: Secondary | ICD-10-CM | POA: Diagnosis present

## 2022-12-20 DIAGNOSIS — T50996A Underdosing of other drugs, medicaments and biological substances, initial encounter: Secondary | ICD-10-CM | POA: Diagnosis present

## 2022-12-20 DIAGNOSIS — E8809 Other disorders of plasma-protein metabolism, not elsewhere classified: Secondary | ICD-10-CM | POA: Diagnosis present

## 2022-12-20 DIAGNOSIS — M79605 Pain in left leg: Secondary | ICD-10-CM | POA: Diagnosis present

## 2022-12-20 DIAGNOSIS — L0390 Cellulitis, unspecified: Secondary | ICD-10-CM | POA: Diagnosis present

## 2022-12-20 DIAGNOSIS — Z8249 Family history of ischemic heart disease and other diseases of the circulatory system: Secondary | ICD-10-CM

## 2022-12-20 DIAGNOSIS — F17210 Nicotine dependence, cigarettes, uncomplicated: Secondary | ICD-10-CM | POA: Diagnosis present

## 2022-12-20 DIAGNOSIS — Z5901 Sheltered homelessness: Secondary | ICD-10-CM

## 2022-12-20 DIAGNOSIS — Z825 Family history of asthma and other chronic lower respiratory diseases: Secondary | ICD-10-CM

## 2022-12-20 DIAGNOSIS — L03315 Cellulitis of perineum: Principal | ICD-10-CM

## 2022-12-20 DIAGNOSIS — D61818 Other pancytopenia: Secondary | ICD-10-CM | POA: Diagnosis present

## 2022-12-20 DIAGNOSIS — Z833 Family history of diabetes mellitus: Secondary | ICD-10-CM

## 2022-12-20 DIAGNOSIS — G8929 Other chronic pain: Secondary | ICD-10-CM | POA: Diagnosis present

## 2022-12-20 DIAGNOSIS — Z91030 Bee allergy status: Secondary | ICD-10-CM

## 2022-12-20 LAB — CBC WITH DIFFERENTIAL/PLATELET
Abs Immature Granulocytes: 0.01 10*3/uL (ref 0.00–0.07)
Basophils Absolute: 0 10*3/uL (ref 0.0–0.1)
Basophils Relative: 0 %
Eosinophils Absolute: 0.9 10*3/uL — ABNORMAL HIGH (ref 0.0–0.5)
Eosinophils Relative: 35 %
HCT: 33.4 % — ABNORMAL LOW (ref 36.0–46.0)
Hemoglobin: 10.2 g/dL — ABNORMAL LOW (ref 12.0–15.0)
Immature Granulocytes: 0 %
Lymphocytes Relative: 10 %
Lymphs Abs: 0.3 10*3/uL — ABNORMAL LOW (ref 0.7–4.0)
MCH: 26.4 pg (ref 26.0–34.0)
MCHC: 30.5 g/dL (ref 30.0–36.0)
MCV: 86.5 fL (ref 80.0–100.0)
Monocytes Absolute: 0.2 10*3/uL (ref 0.1–1.0)
Monocytes Relative: 6 %
Neutro Abs: 1.3 10*3/uL — ABNORMAL LOW (ref 1.7–7.7)
Neutrophils Relative %: 49 %
Platelets: 134 10*3/uL — ABNORMAL LOW (ref 150–400)
RBC: 3.86 MIL/uL — ABNORMAL LOW (ref 3.87–5.11)
RDW: 16.2 % — ABNORMAL HIGH (ref 11.5–15.5)
WBC: 2.6 10*3/uL — ABNORMAL LOW (ref 4.0–10.5)
nRBC: 0 % (ref 0.0–0.2)

## 2022-12-20 LAB — BASIC METABOLIC PANEL
Anion gap: 7 (ref 5–15)
BUN: 11 mg/dL (ref 6–20)
CO2: 24 mmol/L (ref 22–32)
Calcium: 7.9 mg/dL — ABNORMAL LOW (ref 8.9–10.3)
Chloride: 106 mmol/L (ref 98–111)
Creatinine, Ser: 0.6 mg/dL (ref 0.44–1.00)
GFR, Estimated: >60 mL/min (ref >60)
Glucose, Bld: 94 mg/dL (ref 70–99)
Potassium: 3.2 mmol/L — ABNORMAL LOW (ref 3.5–5.1)
Sodium: 137 mmol/L (ref 135–145)

## 2022-12-20 LAB — LACTIC ACID, PLASMA: Lactic Acid, Venous: 1.6 mmol/L (ref 0.5–1.9)

## 2022-12-20 MED ORDER — LORAZEPAM 1 MG PO TABS
1.0000 mg | ORAL_TABLET | Freq: Once | ORAL | Status: AC
  Administered 2022-12-20: 1 mg via ORAL
  Filled 2022-12-20: qty 1

## 2022-12-20 MED ORDER — HYDROMORPHONE HCL 1 MG/ML IJ SOLN
1.0000 mg | Freq: Once | INTRAMUSCULAR | Status: AC
  Administered 2022-12-20: 1 mg via INTRAVENOUS
  Filled 2022-12-20: qty 1

## 2022-12-20 MED ORDER — HYDROXYZINE HCL 25 MG PO TABS
25.0000 mg | ORAL_TABLET | Freq: Once | ORAL | Status: AC
  Administered 2022-12-20: 25 mg via ORAL
  Filled 2022-12-20: qty 1

## 2022-12-20 MED ORDER — DIPHENHYDRAMINE HCL 50 MG/ML IJ SOLN
25.0000 mg | Freq: Once | INTRAMUSCULAR | Status: AC
  Administered 2022-12-20: 25 mg via INTRAVENOUS
  Filled 2022-12-20: qty 1

## 2022-12-20 NOTE — ED Provider Notes (Signed)
[name] Provider Note   CSN: [id number] Arrival date & time: 12/20/22  1903     History {Add pertinent medical, surgical, social history, OB history to HPI:1} Chief Complaint  Patient presents with   Leg Pain    Beverly [name] is a 42 y.o. female    Leg Pain      Home Medications Prior to Admission medications   Medication Sig Start Date End Date Taking? Authorizing Provider  acetaminophen (TYLENOL) 500 MG tablet Take 500 mg by mouth every 6 (six) hours as needed for moderate pain.    [provider]  azithromycin (ZITHROMAX) 600 MG tablet Take 2 tablets (1,200 mg total) by mouth once a week. 10/19/22   Blanchard Kelch, NP  bictegravir-emtricitabine-tenofovir AF (BIKTARVY) 50-200-25 MG TABS tablet Take 1 tablet by mouth daily. Try to take at the same time each day with or without food. 10/19/22   Blanchard Kelch, NP  diphenhydrAMINE (BENADRYL ALLERGY) 25 mg capsule Take 1 capsule (25 mg total) by mouth every 6 (six) hours as needed. Patient not taking: Reported on 10/19/2022 09/10/20   Odette Fraction, MD  hydrOXYzine (ATARAX) 25 MG tablet Take 1 tablet (25 mg total) by mouth 3 (three) times daily as needed. 10/19/22   Blanchard Kelch, NP  pregabalin (LYRICA) 25 MG capsule Take 1 capsule (25 mg total) by mouth 2 (two) times daily. Patient not taking: Reported on 10/19/2022 01/22/22   Blanchard Kelch, NP  sulfamethoxazole-trimethoprim (BACTRIM DS) 800-160 MG tablet Take 1 tablet by mouth daily. 10/19/22   Blanchard Kelch, NP  vitamin B-12 1000 MCG tablet Take 1 tablet (1,000 mcg total) by mouth daily. Patient not taking: Reported on 10/26/2021 01/28/21   Almon Hercules, MD  Wound Dressings (PETROLEUM GAUZE NON-WOVEN 3X9") MISC Apply 1 each topically daily. 10/19/22   Blanchard Kelch, NP      Allergies    Bee venom, Ferrlecit [na ferric gluc cplx in sucrose], and Morphine and codeine    Review of Systems    Review of Systems  Physical Exam Updated Vital Signs BP 123/87   Pulse (!) 107   Temp (!) 97.4 F (36.3 C) (Oral)   Resp 20   Ht 5\' 1"  (1.549 m)   Wt 54.4 kg   SpO2 99%   BMI 22.67 kg/m  Physical Exam  ED Results / Procedures / Treatments   Labs (all labs ordered are listed, but only abnormal results are displayed) Labs Reviewed  CBC WITH DIFFERENTIAL/PLATELET - Abnormal; Notable for the following components:      Result Value   WBC 2.6 (*)    RBC 3.86 (*)    Hemoglobin 10.2 (*)    HCT 33.4 (*)    RDW 16.2 (*)    Platelets 134 (*)    Neutro Abs 1.3 (*)    Lymphs Abs 0.3 (*)    Eosinophils Absolute 0.9 (*)    All other components within normal limits  BASIC METABOLIC PANEL - Abnormal; Notable for the following components:   Potassium 3.2 (*)    Calcium 7.9 (*)    All other components within normal limits  LACTIC ACID, PLASMA  LACTIC ACID, PLASMA    EKG None  Radiology No results found.  Procedures Procedures  {Document cardiac monitor, telemetry assessment procedure when appropriate:1}  Medications Ordered in ED Medications  diphenhydrAMINE (BENADRYL) injection 25 mg (25 mg Intravenous Given 12/20/22 2237)  HYDROmorphone (DILAUDID) injection  1 mg (1 mg Intravenous Given 12/20/22 2235)  hydrOXYzine (ATARAX) tablet 25 mg (25 mg Oral Given 12/20/22 2309)  LORazepam (ATIVAN) tablet 1 mg (1 mg Oral Given 12/20/22 2309)    ED Course/ Medical Decision Making/ A&P   {   Click here for ABCD2, HEART and other calculatorsREFRESH Note before signing :1}                          Medical Decision Making Amount and/or Complexity of Data Reviewed Labs: ordered. Radiology: ordered.  Risk Prescription drug management.   ***  {Document critical care time when appropriate:1} {Document review of labs and clinical decision tools ie heart score, Chads2Vasc2 etc:1}  {Document your independent review of radiology images, and any outside records:1} {Document your  discussion with family members, caretakers, and with consultants:1} {Document social determinants of health affecting pt's care:1} {Document your decision making why or why not admission, treatments were needed:1} Final Clinical Impression(s) / ED Diagnoses Final diagnoses:  None    Rx / DC Orders ED Discharge Orders     None

## 2022-12-20 NOTE — ED Triage Notes (Signed)
Pt arrives with c/o bilateral leg pain that is chronic in nature, but pt says it has gotten worse over the last 3 days. Per pt, the pain in her legs has made her whole body ache and she has been having issues ambulating.

## 2022-12-21 ENCOUNTER — Other Ambulatory Visit (HOSPITAL_COMMUNITY): Payer: Self-pay

## 2022-12-21 ENCOUNTER — Emergency Department (HOSPITAL_BASED_OUTPATIENT_CLINIC_OR_DEPARTMENT_OTHER): Payer: Medicaid Other

## 2022-12-21 ENCOUNTER — Telehealth (HOSPITAL_COMMUNITY): Payer: Self-pay | Admitting: Pharmacy Technician

## 2022-12-21 DIAGNOSIS — T50996A Underdosing of other drugs, medicaments and biological substances, initial encounter: Secondary | ICD-10-CM | POA: Diagnosis present

## 2022-12-21 DIAGNOSIS — F419 Anxiety disorder, unspecified: Secondary | ICD-10-CM | POA: Diagnosis present

## 2022-12-21 DIAGNOSIS — D61818 Other pancytopenia: Secondary | ICD-10-CM

## 2022-12-21 DIAGNOSIS — L03314 Cellulitis of groin: Secondary | ICD-10-CM | POA: Diagnosis present

## 2022-12-21 DIAGNOSIS — Z833 Family history of diabetes mellitus: Secondary | ICD-10-CM | POA: Diagnosis not present

## 2022-12-21 DIAGNOSIS — N200 Calculus of kidney: Secondary | ICD-10-CM

## 2022-12-21 DIAGNOSIS — Z597 Insufficient social insurance and welfare support: Secondary | ICD-10-CM | POA: Diagnosis not present

## 2022-12-21 DIAGNOSIS — E876 Hypokalemia: Secondary | ICD-10-CM | POA: Diagnosis present

## 2022-12-21 DIAGNOSIS — Z8249 Family history of ischemic heart disease and other diseases of the circulatory system: Secondary | ICD-10-CM | POA: Diagnosis not present

## 2022-12-21 DIAGNOSIS — L03311 Cellulitis of abdominal wall: Secondary | ICD-10-CM | POA: Diagnosis not present

## 2022-12-21 DIAGNOSIS — G8929 Other chronic pain: Secondary | ICD-10-CM | POA: Diagnosis present

## 2022-12-21 DIAGNOSIS — F32A Depression, unspecified: Secondary | ICD-10-CM | POA: Diagnosis present

## 2022-12-21 DIAGNOSIS — E8809 Other disorders of plasma-protein metabolism, not elsewhere classified: Secondary | ICD-10-CM | POA: Diagnosis present

## 2022-12-21 DIAGNOSIS — B20 Human immunodeficiency virus [HIV] disease: Secondary | ICD-10-CM | POA: Diagnosis present

## 2022-12-21 DIAGNOSIS — Z5901 Sheltered homelessness: Secondary | ICD-10-CM | POA: Diagnosis not present

## 2022-12-21 DIAGNOSIS — F17210 Nicotine dependence, cigarettes, uncomplicated: Secondary | ICD-10-CM | POA: Diagnosis present

## 2022-12-21 DIAGNOSIS — C469 Kaposi's sarcoma, unspecified: Secondary | ICD-10-CM | POA: Diagnosis present

## 2022-12-21 DIAGNOSIS — M79604 Pain in right leg: Secondary | ICD-10-CM | POA: Diagnosis present

## 2022-12-21 DIAGNOSIS — Z808 Family history of malignant neoplasm of other organs or systems: Secondary | ICD-10-CM | POA: Diagnosis not present

## 2022-12-21 DIAGNOSIS — G629 Polyneuropathy, unspecified: Secondary | ICD-10-CM | POA: Diagnosis present

## 2022-12-21 DIAGNOSIS — M79605 Pain in left leg: Secondary | ICD-10-CM | POA: Diagnosis present

## 2022-12-21 DIAGNOSIS — Z825 Family history of asthma and other chronic lower respiratory diseases: Secondary | ICD-10-CM | POA: Diagnosis not present

## 2022-12-21 DIAGNOSIS — L0390 Cellulitis, unspecified: Secondary | ICD-10-CM | POA: Diagnosis present

## 2022-12-21 DIAGNOSIS — J45909 Unspecified asthma, uncomplicated: Secondary | ICD-10-CM | POA: Diagnosis present

## 2022-12-21 DIAGNOSIS — L03319 Cellulitis of trunk, unspecified: Secondary | ICD-10-CM

## 2022-12-21 DIAGNOSIS — L03818 Cellulitis of other sites: Secondary | ICD-10-CM

## 2022-12-21 LAB — HEPATIC FUNCTION PANEL
ALT: 12 U/L (ref 0–44)
AST: 27 U/L (ref 15–41)
Albumin: 2.9 g/dL — ABNORMAL LOW (ref 3.5–5.0)
Alkaline Phosphatase: 80 U/L (ref 38–126)
Bilirubin, Direct: <0.1 mg/dL (ref 0.0–0.2)
Total Bilirubin: 0.6 mg/dL (ref 0.3–1.2)
Total Protein: 8.1 g/dL (ref 6.5–8.1)

## 2022-12-21 LAB — BASIC METABOLIC PANEL
Anion gap: 6 (ref 5–15)
BUN: 9 mg/dL (ref 6–20)
CO2: 23 mmol/L (ref 22–32)
Calcium: 7.4 mg/dL — ABNORMAL LOW (ref 8.9–10.3)
Chloride: 108 mmol/L (ref 98–111)
Creatinine, Ser: 0.54 mg/dL (ref 0.44–1.00)
GFR, Estimated: >60 mL/min (ref >60)
Glucose, Bld: 80 mg/dL (ref 70–99)
Potassium: 3.1 mmol/L — ABNORMAL LOW (ref 3.5–5.1)
Sodium: 137 mmol/L (ref 135–145)

## 2022-12-21 LAB — HCG, QUANTITATIVE, PREGNANCY: hCG, Beta Chain, Quant, S: <1 m[IU]/mL (ref <5)

## 2022-12-21 LAB — MAGNESIUM: Magnesium: 1.5 mg/dL — ABNORMAL LOW (ref 1.7–2.4)

## 2022-12-21 MED ORDER — POTASSIUM CHLORIDE CRYS ER 20 MEQ PO TBCR
40.0000 meq | EXTENDED_RELEASE_TABLET | ORAL | Status: AC
  Administered 2022-12-21: 40 meq via ORAL
  Filled 2022-12-21: qty 2

## 2022-12-21 MED ORDER — HYDRALAZINE HCL 20 MG/ML IJ SOLN: 10.0000 mg | INTRAMUSCULAR | Status: DC | PRN

## 2022-12-21 MED ORDER — IOHEXOL 300 MG/ML  SOLN
100.0000 mL | Freq: Once | INTRAMUSCULAR | Status: AC | PRN
  Administered 2022-12-21: 100 mL via INTRAVENOUS

## 2022-12-21 MED ORDER — OXYCODONE HCL 5 MG PO TABS
5.0000 mg | ORAL_TABLET | Freq: Once | ORAL | Status: AC
  Administered 2022-12-21: 5 mg via ORAL
  Filled 2022-12-21: qty 1

## 2022-12-21 MED ORDER — PIPERACILLIN-TAZOBACTAM 3.375 G IVPB
3.3750 g | Freq: Three times a day (TID) | INTRAVENOUS | Status: DC
  Administered 2022-12-21 – 2022-12-22 (×6): 3.375 g via INTRAVENOUS
  Filled 2022-12-21 (×6): qty 50

## 2022-12-21 MED ORDER — BICTEGRAVIR-EMTRICITAB-TENOFOV 50-200-25 MG PO TABS
1.0000 | ORAL_TABLET | Freq: Every day | ORAL | Status: DC
  Administered 2022-12-21 – 2022-12-28 (×8): 1 via ORAL
  Filled 2022-12-21 (×9): qty 1

## 2022-12-21 MED ORDER — ACETAMINOPHEN 650 MG RE SUPP: 650.0000 mg | Freq: Four times a day (QID) | RECTAL | Status: DC | PRN

## 2022-12-21 MED ORDER — ONDANSETRON HCL 4 MG/2ML IJ SOLN: 4.0000 mg | Freq: Four times a day (QID) | INTRAMUSCULAR | Status: DC | PRN

## 2022-12-21 MED ORDER — ACETAMINOPHEN 325 MG PO TABS
650.0000 mg | ORAL_TABLET | Freq: Four times a day (QID) | ORAL | Status: DC | PRN
  Administered 2022-12-22: 650 mg via ORAL
  Filled 2022-12-21: qty 2

## 2022-12-21 MED ORDER — SODIUM CHLORIDE 0.9% FLUSH
3.0000 mL | Freq: Two times a day (BID) | INTRAVENOUS | Status: DC
  Administered 2022-12-21 – 2022-12-27 (×14): 3 mL via INTRAVENOUS

## 2022-12-21 MED ORDER — SACCHAROMYCES BOULARDII 250 MG PO CAPS
250.0000 mg | ORAL_CAPSULE | Freq: Two times a day (BID) | ORAL | Status: DC
  Administered 2022-12-21 – 2022-12-28 (×15): 250 mg via ORAL
  Filled 2022-12-21 (×16): qty 1

## 2022-12-21 MED ORDER — MAGNESIUM SULFATE 2 GM/50ML IV SOLN
2.0000 g | Freq: Once | INTRAVENOUS | Status: AC
  Administered 2022-12-21: 2 g via INTRAVENOUS
  Filled 2022-12-21: qty 50

## 2022-12-21 MED ORDER — OXYCODONE HCL 5 MG PO TABS
5.0000 mg | ORAL_TABLET | ORAL | Status: DC | PRN
  Administered 2022-12-21 – 2022-12-28 (×17): 5 mg via ORAL
  Filled 2022-12-21 (×17): qty 1

## 2022-12-21 MED ORDER — FENTANYL CITRATE PF 50 MCG/ML IJ SOSY
50.0000 ug | PREFILLED_SYRINGE | Freq: Once | INTRAMUSCULAR | Status: AC
  Administered 2022-12-21: 50 ug via INTRAVENOUS
  Filled 2022-12-21: qty 1

## 2022-12-21 MED ORDER — SODIUM CHLORIDE 0.9 % IV BOLUS
1000.0000 mL | Freq: Once | INTRAVENOUS | Status: AC
  Administered 2022-12-21: 1000 mL via INTRAVENOUS

## 2022-12-21 MED ORDER — ENOXAPARIN SODIUM 40 MG/0.4ML IJ SOSY
40.0000 mg | PREFILLED_SYRINGE | INTRAMUSCULAR | Status: DC
  Administered 2022-12-21 – 2022-12-23 (×3): 40 mg via SUBCUTANEOUS
  Filled 2022-12-21 (×4): qty 0.4

## 2022-12-21 MED ORDER — ONDANSETRON HCL 4 MG PO TABS: 4.0000 mg | ORAL_TABLET | Freq: Four times a day (QID) | ORAL | Status: DC | PRN

## 2022-12-21 MED ORDER — CALCIUM GLUCONATE-NACL 2-0.675 GM/100ML-% IV SOLN
2.0000 g | Freq: Once | INTRAVENOUS | Status: AC
  Administered 2022-12-21: 2000 mg via INTRAVENOUS
  Filled 2022-12-21 (×2): qty 100

## 2022-12-21 MED ORDER — ALBUTEROL SULFATE (2.5 MG/3ML) 0.083% IN NEBU: 2.5000 mg | INHALATION_SOLUTION | Freq: Four times a day (QID) | RESPIRATORY_TRACT | Status: DC | PRN

## 2022-12-21 NOTE — TOC Initial Note (Signed)
Transition of Care [facility]) - Initial/Assessment Note    Patient Details  Name: Beverly [name] MRN: [id number] Date of Birth: 09/01/1980  Transition of Care [facility]) CM/SW Contact:    Ronny Bacon, RN Phone Number: 12/21/2022, 1:04 PM  Clinical Narrative: Spoke with patient at bedside. Patient is homeless and usually couch surfs where she can. Currently she is staying with her brother. Patient recently found out she lost her medicaid coverage and does not know what happened. Her PCP is Leroy Kennedy and she uses Therapist, occupational for her pharmacy. Patient works at [employer]. Brother will transport patient home at discharge.  TOC consulted for medication assistance for Biktarvy. In house pharmacist consulted for assistance, ID pharmacist enrolling patient in uninsured access for medication assistance with Biktarvy.                 Expected Discharge Plan: Home/Self Care Barriers to Discharge: Continued Medical Work up   Patient Goals and CMS Choice Patient states their goals for this hospitalization and ongoing recovery are:: To go home          Expected Discharge Plan and Services   Discharge Planning Services: CM Consult, Medication Assistance   Living arrangements for the past 2 months: Homeless (Currently staying with brother)                                      Prior Living Arrangements/Services Living arrangements for the past 2 months: Homeless (Currently staying with brother) Lives with:: Siblings Patient language and need for interpreter reviewed:: Yes Do you feel safe going back to the place where you live?: Yes      Need for Family Participation in Patient Care: No (Comment) Care giver support system in place?: Yes (comment)   Criminal Activity/Legal Involvement Pertinent to Current Situation/Hospitalization: No - Comment as needed  Activities of Daily Living      Permission Sought/Granted Permission sought to share information with : Case Manager, Neurosurgeon Permission granted to share information with : Yes, Verbal Permission Granted              Emotional Assessment Appearance:: Appears stated age Attitude/Demeanor/Rapport: Engaged Affect (typically observed): Appropriate Orientation: : Oriented to Self, Oriented to Place, Oriented to  Time, Oriented to Situation Alcohol / Substance Use: Not Applicable Psych Involvement: No (comment)  Admission diagnosis:  Cellulitis of perineum [L03.315] Cellulitis [L03.90] Patient Active Problem List   Diagnosis Date Noted   Cellulitis 12/21/2022   Hypomagnesemia 01/23/2021   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   Nonintractable headache    Generalized lymphadenopathy    Skin rash associated with AIDS (HCC) 08/27/2020   Cellulitis of abdominal wall    HIV disease (HCC)    Weight loss    Nausea & vomiting 05/25/2018   Abdominal wall skin ulcer (HCC) 03/10/2018   Itching 03/10/2018   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 [place] [address] Phone: [phone] Fax: [phone]     Social Determinants of Health (SDOH) Social History: SDOH Screenings   Depression (PHQ2-9): High Risk (10/19/2022)  Tobacco Use: High Risk (10/19/2022)   SDOH Interventions:     Readmission Risk Interventions     No data to display

## 2022-12-21 NOTE — Progress Notes (Signed)
Plan of Care Note for accepted transfer   Patient: Beverly [name] MRN: [id number]   DOA: 12/20/2022  Facility requesting transfer: [facility]   Requesting Provider: Dr. Madilyn Hook   Reason for transfer: Cellulitis   Facility course: 42 yr old lady with HIV/AIDS (CD4 undetectable and VL 142k in April 2024), peripheral neuropathy, and non-adherence with medications who p/w increase in chronic b/l leg pain and lower abdominal wall wound.   She is afebrile in ED with WBC 2.6 and normal lactate. CT findings consistent with cellulitis involving anterior perineum without fluid collection.   Blood cultures were collected and she was treated with 1 L NS, Zosyn, Atarax, Ativan, Dilaudid, and Benadryl.   Plan of care: The patient is accepted for admission to Med-surg  unit, at [facility].   Author: Briscoe Deutscher, MD 12/21/2022  Check [email or web address] for on-call coverage.  Nursing staff, Please call [facility] Admits & Consults System-Wide number on Amion as soon as patient's arrival, so appropriate admitting provider can evaluate the pt.

## 2022-12-21 NOTE — Care Management (Addendum)
Per in house pharmacy, patient is approved for uninsured access for [employer]. Patient will get 30 days filled from [facility] and then can use any pharmacy of her choice for refills. Patient aware of process.

## 2022-12-21 NOTE — H&P (Addendum)
History and Physical    Patient: Beverly [name] ZOX:[id number] DOB: [birth year] DOA: 12/20/2022 DOS: the patient was seen and examined on 12/21/2022 PCP: Pcp, No  Patient coming from: Transfer from [facility]  Chief Complaint:  Chief Complaint  Patient presents with   Leg Pain   HPI: Beverly [name] is a 42 y.o. female with medical history significant of HIV/AIDS, Kaposi's sarcoma , peripheral neuropathy, nephrolithiasis, anxiety, and depression who presents with complaints of and lower abdominal wall wound.  She makes note that she recently was unable to get a refill on her Biktarvy due to a change in her insurance.  She reports having a wound over her pubic area that had significantly enlarged over the last couple weeks, and in the last 2 weeks she reports that it has been draining yellow and green fluid.  She reports having fevers at home prior to arrival up to 101-102 F.  Also makes note of bilateral lower extremity pain which is chronic in nature.  Denies having any recent episodes of nausea and vomiting.  Patient reports living currently with her brother.  In the emergency department patient was noted to be afebrile with pulse elevated up to 113, respirations elevated up to 25, and all other vital signs maintained.  Labs 6/30 significant for WBC 2.6, hemoglobin 10.2, platelet count 134, potassium 3.2, and lactic acid 1.6.  CT scan of the abdomen pelvis noted anterior peritoneal cellulitis with no associated fluid collection/abscess, cutaneous nodular thickening/lesion along the lower abdominal wall similar to prior, and multiple nonobstructing bilateral renal calculi.  Patient has been given Ativan 1 mg p.o., Dilaudid 1 mg IV, Benadryl 25 mg IV, hydroxyzine 25 mg p.o., 1 L normal saline IV fluids, Zosyn, and oxycodone 5 mg p.o.   Review of Systems: As mentioned in the history of present illness. All other systems reviewed and are negative. Past Medical History:  Diagnosis Date   Abscess    AIDS  (HCC) 03/19/2015   Anxiety    Asthma    Blood transfusion without reported diagnosis    Depression    HIV infection (HCC)    Homeless 03/19/2015   states stays with family or friends. Does not rent or own a home, but does not live on streets.   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   Skin ulcer (HCC) 05/06/2015   Past Surgical History:  Procedure Laterality Date   CERVICAL CONIZATION W/BX N/A 04/14/2018   Procedure: COLD KNIFE CONIZATION CERVIX WITH BIOPSY;  Surgeon: Shonna Chock, MD;  Location: [facility];  Service: Gynecology;  Laterality: N/A;   CESAREAN SECTION     DILATION AND CURETTAGE OF UTERUS N/A 04/14/2018   Procedure: ENDOCERVICAL CURETTAGE;  Surgeon: Shonna Chock, MD;  Location: [facility];  Service: Gynecology;  Laterality: N/A;   INCISION AND DRAINAGE ABSCESS Right 01/10/2016   Procedure: INCISION AND DRAINAGE RIGHT BUTTOCK, LEFT LABIAL , EXCISION AND DRAINAGE OF  RIGHT AXILLARY ABSCESS;  Surgeon: Glenna Fellows, MD;  Location: [facility];  Service: General;  Laterality: Right;   TUBAL LIGATION  2005   VULVA Ples Specter BIOPSY N/A 04/14/2018   Procedure: VULVAR BIOPSIES;  Surgeon: Shonna Chock, MD;  Location: [facility];  Service: Gynecology;  Laterality: N/A;   Social History:  reports that she has been smoking cigarettes. She has a 0.30 pack-year  smoking history. She has never used smokeless tobacco. She reports current alcohol use. She reports current drug use. Frequency: 2.00 times per week. Drugs: Marijuana and MDMA (Ecstacy).  Allergies  Allergen Reactions   Bee Venom Anaphylaxis, Shortness Of Breath and Swelling   Ferrlecit [Na Ferric Gluc Cplx In Sucrose] Swelling    Swelling and puffiness in hands and legs after Ferrlecit 250 mg infused over 60 minutes (possibly infusion related reaction)    Morphine And Codeine Hives and Itching    Just can't take "morphine", vicodin is OK    Family History  Problem Relation Age of Onset   Throat cancer Mother        smoker   Throat cancer Father        smoker   Hypertension Father    Cirrhosis Father    Hypertension Other    Cancer Other        smoker   Diabetes Other    Asthma Other     Prior to Admission medications   Medication Sig Start Date End Date Taking? Authorizing Provider  acetaminophen (TYLENOL) 500 MG tablet Take 500 mg by mouth every 6 (six) hours as needed for moderate pain.    [provider]  azithromycin (ZITHROMAX) 600 MG tablet Take 2 tablets (1,200 mg total) by mouth once a week. 10/19/22   Blanchard Kelch, NP  bictegravir-emtricitabine-tenofovir AF (BIKTARVY) 50-200-25 MG TABS tablet Take 1 tablet by mouth daily. Try to take at the same time each day with or without food. 10/19/22   Blanchard Kelch, NP  diphenhydrAMINE (BENADRYL ALLERGY) 25 mg capsule Take 1 capsule (25 mg total) by mouth every 6 (six) hours as needed. Patient not taking: Reported on 10/19/2022 09/10/20   Odette Fraction, MD  hydrOXYzine (ATARAX) 25 MG tablet Take 1 tablet (25 mg total) by mouth 3 (three) times daily as needed. 10/19/22   Blanchard Kelch, NP  pregabalin (LYRICA) 25 MG capsule Take 1 capsule (25 mg total) by mouth 2 (two) times daily. Patient not taking: Reported on 10/19/2022 01/22/22   Blanchard Kelch, NP  sulfamethoxazole-trimethoprim (BACTRIM DS) 800-160 MG tablet Take 1 tablet by mouth daily. 10/19/22   Blanchard Kelch, NP  vitamin B-12 1000 MCG tablet Take 1 tablet (1,000 mcg total) by mouth daily. Patient not taking: Reported on 10/26/2021 01/28/21   Almon Hercules, MD  Wound Dressings (PETROLEUM GAUZE NON-WOVEN 3X9") MISC Apply 1 each topically daily. 10/19/22   Blanchard Kelch, NP    Physical Exam: Vitals:   12/21/22 0518 12/21/22 0530 12/21/22 0545 12/21/22 0641  BP: 115/75 (!) 108/93 101/66 135/84   Pulse: 91 100 97 92  Resp: 17 19 19 18   Temp:    98.5 F (36.9 C)  TempSrc:    Oral  SpO2: 100% 99% 94% 100%  Weight:    51.5 kg  Height:    5\' 1"  (1.549 m)   Constitutional: Middle-aged female who appears chronically ill Eyes: PERRL, lids and conjunctivae normal ENMT: Mucous membranes are moist.    Neck: normal, supple  Respiratory: clear to auscultation bilaterally, no wheezing, no crackles. Normal respiratory effort. No accessory muscle use.  Cardiovascular: Regular rate and rhythm, no murmurs / rubs / gallops. No extremity edema.   Abdomen: Suprapubic tenderness to palpation.  Bowel sounds positive.  Musculoskeletal: no clubbing / cyanosis. No joint deformity upper and lower extremities. Good ROM, no contractures. Normal muscle tone.  Skin: Multiple nodules noted of the upper and  lower extremities with significant dry skin.  Open wound with surrounding induration noted at the perineum and foul-smelling drainage as seen below    Neurologic: CN 2-12 grossly intact. Strength 5/5 in all 4.  Psychiatric: Normal judgment and insight. Alert and oriented x 3. Normal mood.   Data Reviewed:  Reviewed labs, imaging, and pertinent records as noted in this document Assessment and Plan:  Cellulitis of suprapubic region Acute.  Patient presented with complaints of pain in her legs and lower abdomen.  Noted to have wound of the perineum with induration and drainage.  CT revealed soft tissue wound with concern for infection without abscess present. -Admit to MedSurg bed -Follow-up blood cultures -Continue empiric antibiotics of Zosyn -Oxycodone as needed for pain -Wound care consulted, we will follow-up for further recommendations  Pancytopenia Acute on chronic.  On admission WBC 2.5 and hemoglobin 10.2 which appears similar to prior.  However, platelet count or acutely low at 134.  Review of records note platelet count intermittently has been as low as 78 approximately 2 years  ago. -Continue to monitor  Hypokalemia Hypomagnesemia Hypocalcemia Acute.  Labs today noted potassium 3.1, calcium 7.4, and magnesium 1.5. -Give potassium chloride 40 meq p.o. -Give calcium gluconate 2 g IV -Give magnesium sulfate 2 g IV -Continue to monitor and replace as  AIDS Kaposi sarcoma Diagnosed back in 2005.  Patient has nodular lesions bilateral upper and lower extremities.  Intermittent adherence with Biktarvy treatment CD4 undetectable and viral load 142k in April 2024.  On last office visit from 4/29 patient was recommended to continue Biktarvy, Bactrim, and azithromycin 200 mg q. weekly for MAC prevention. -Continue Biktarvy   Anxiety and depression -Hydroxyzine as noted  Bilateral leg pain Possibly related with electrolyte abnormalities  Nephrolithiasis Chronic.  Patient noted to have bilateral nephrolithiasis with largest measuring up to 5 mm without any obstructing stones appreciated at this time.    DVT prophylaxis: Lovenox, but consider discontinuing if platelet count falls below 100,000 or bleeding appreciated Advance Care Planning:   Code Status: Full Code    Consults: none  Family Communication: None requested when asked  Severity of Illness: The appropriate patient status for this patient is INPATIENT. Inpatient status is judged to be reasonable and necessary in order to provide the required intensity of service to ensure the patient's safety. The patient's presenting symptoms, physical exam findings, and initial radiographic and laboratory data in the context of their chronic comorbidities is felt to place them at high risk for further clinical deterioration. Furthermore, it is not anticipated that the patient will be medically stable for discharge from the hospital within 2 midnights of admission.   * I certify that at the point of admission it is my clinical judgment that the patient will require inpatient hospital care spanning beyond 2 midnights from  the point of admission due to high intensity of service, high risk for further deterioration and high frequency of surveillance required.*  Author: Clydie Braun, MD 12/21/2022 7:16 AM  For on call review [email or web address].

## 2022-12-21 NOTE — ED Notes (Signed)
[facility] called for transport. 

## 2022-12-21 NOTE — Telephone Encounter (Signed)
Patient Advocate Encounter  Completed and sent [employer] Advancing Access application for Biktarvy for this patient who is uninsured.      BIN      [id number] PCN    [id number] GRP    [id number] ID        [id number]     Roland Earl, CPhT Pharmacy Patient Advocate Specialist [facility] Patient Advocate Team Direct Number: [phone] Fax: [phone]

## 2022-12-21 NOTE — Consult Note (Signed)
WOC Nurse Consult Note: Reason for Consult:abdominal wound, patient familiar to Beverly [name], however we have not seen her since 2022 Wound type: Chronic suprapubic wound, thought to be related to HIV/AIDS status, immunocompromised. Present greater than 2 years  Pressure Injury POA: NA Measurement: see nursing flow sheet Wound bed: pale, pink, hypergranular tissue, yellow tissue growth but does not appear to be slough Drainage (amount, consistency, odor) noted in ED to be none Periwound: intact, evidence of larger wound with scarring adjacent to open wound CT scan to verify no deeper abscesses or fistula track;  IMPRESSION: Suspected anterior perineal cellulitis. No associated fluid collection/abscess.Cutaneous nodular thickening/lesion along the lower abdominal wall,similar to the prior. Dressing procedure/placement/frequency: Cleanse wound with Vashe, pat dry Cover with silver hydrofiber (cut to fit) over wound bed, top with foam dressing. Change every other day.   Silver hydrofiber [place] # [phone]) Vashe cleanser [employer] [place] # [phone])   Re consult if needed, will not follow at this time. Thanks  Shebra Muldrow [employer], RN,CWOCN, CNS, CWON-AP [phone])

## 2022-12-21 NOTE — Progress Notes (Signed)
Pharmacy Antibiotic Note  Beverly [name] is a 42 y.o. female admitted on 12/20/2022 with  wound infection .  Pharmacy has been consulted for Zosyn dosing.  Plan: Zosyn 3.375g IV q8h (4 hour infusion). Follow culture data for de-escalation.  Monitor renal function for dose adjustments as indicated.   Height: 5\' 1"  (154.9 cm) Weight: 54.4 kg (120 lb) IBW/kg (Calculated) : 47.8  Temp (24hrs), Avg:97.8 F (36.6 C), Min:97.4 F (36.3 C), Max:98.2 F (36.8 C)  Recent Labs  Lab 12/20/22 1919 12/20/22 2237  WBC 2.6*  --   CREATININE 0.60  --   LATICACIDVEN  --  1.6    Estimated Creatinine Clearance: 70.5 mL/min (by C-G formula based on SCr of 0.6 mg/dL).    Allergies  Allergen Reactions   Bee Venom Anaphylaxis, Shortness Of Breath and Swelling   Ferrlecit [Na Ferric Gluc Cplx In Sucrose] Swelling    Swelling and puffiness in hands and legs after Ferrlecit 250 mg infused over 60 minutes (possibly infusion related reaction)   Morphine And Codeine Hives and Itching    Just can't take "morphine", vicodin is OK    Thank you for allowing pharmacy to be a part of this patient's care.  Estill Batten, PharmD, BCCCP  12/21/2022 12:43 AM

## 2022-12-22 DIAGNOSIS — S31109A Unspecified open wound of abdominal wall, unspecified quadrant without penetration into peritoneal cavity, initial encounter: Secondary | ICD-10-CM

## 2022-12-22 DIAGNOSIS — L089 Local infection of the skin and subcutaneous tissue, unspecified: Secondary | ICD-10-CM

## 2022-12-22 DIAGNOSIS — L0390 Cellulitis, unspecified: Secondary | ICD-10-CM

## 2022-12-22 LAB — BASIC METABOLIC PANEL
Anion gap: 7 (ref 5–15)
BUN: 10 mg/dL (ref 6–20)
CO2: 23 mmol/L (ref 22–32)
Calcium: 7.9 mg/dL — ABNORMAL LOW (ref 8.9–10.3)
Chloride: 105 mmol/L (ref 98–111)
Creatinine, Ser: 0.87 mg/dL (ref 0.44–1.00)
GFR, Estimated: >60 mL/min (ref >60)
Glucose, Bld: 104 mg/dL — ABNORMAL HIGH (ref 70–99)
Potassium: 3.8 mmol/L (ref 3.5–5.1)
Sodium: 135 mmol/L (ref 135–145)

## 2022-12-22 LAB — CBC
HCT: 29.9 % — ABNORMAL LOW (ref 36.0–46.0)
Hemoglobin: 9.1 g/dL — ABNORMAL LOW (ref 12.0–15.0)
MCH: 26.8 pg (ref 26.0–34.0)
MCHC: 30.4 g/dL (ref 30.0–36.0)
MCV: 88.2 fL (ref 80.0–100.0)
Platelets: 96 10*3/uL — ABNORMAL LOW (ref 150–400)
RBC: 3.39 MIL/uL — ABNORMAL LOW (ref 3.87–5.11)
RDW: 16 % — ABNORMAL HIGH (ref 11.5–15.5)
WBC: 2.5 10*3/uL — ABNORMAL LOW (ref 4.0–10.5)
nRBC: 0 % (ref 0.0–0.2)

## 2022-12-22 LAB — MAGNESIUM: Magnesium: 1.9 mg/dL (ref 1.7–2.4)

## 2022-12-22 MED ORDER — SODIUM CHLORIDE 0.9 % IV SOLN
3.0000 g | Freq: Four times a day (QID) | INTRAVENOUS | Status: DC
  Administered 2022-12-22 – 2022-12-24 (×7): 3 g via INTRAVENOUS
  Filled 2022-12-22 (×7): qty 8

## 2022-12-22 NOTE — Progress Notes (Signed)
PRN tylenol given for headache per pt request 

## 2022-12-22 NOTE — Consult Note (Signed)
[facility]       Reason for Consult: abdominal wound    Referring Physician: Dr. Marland Mcalpine  Principal Problem:   Cellulitis Active Problems:   AIDS (acquired immune deficiency syndrome) (HCC)   Anxiety and depression   Hypokalemia   Pancytopenia (HCC)   Hypomagnesemia   Hypocalcemia   Kaposi sarcoma (HCC)   Nephrolithiasis    bictegravir-emtricitabine-tenofovir AF  1 tablet Oral Daily   enoxaparin (LOVENOX) injection  40 mg Subcutaneous Q24H   saccharomyces boulardii  250 mg Oral BID   sodium chloride flush  3 mL Intravenous Q12H    Recommendations: Will change to amp/sulbactam Monitor for improvement Biktarvy Access through pt assistance  Assessment: She has a wound infection with pus and inflammation in the setting of poorly controlled HIV  HPI: Beverly [name] is a 42 y.o. female with HIV, poorly compliant with last CD4 of < 35 in April, viral load of 142,000 here with an abdominal wall wound.  She noted some purulence and pain in the area associated with fever.  No fever since admission.  CT without a fluid collection.  Blood cultures sent.  She reports a subjective fever.  No stable place to live.     Review of Systems:  Constitutional: positive for fevers and chills All other systems reviewed and are negative    Past Medical History:  Diagnosis Date   Abscess    AIDS (HCC) 03/19/2015   Anxiety    Asthma    Blood transfusion without reported diagnosis    Depression    HIV infection (HCC)    Homeless 03/19/2015   states stays with family or friends. Does not rent or own a home, but does not live on streets.   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   Skin ulcer (HCC) 05/06/2015    Social History   Tobacco Use   Smoking status: Every Day    Packs/day: 0.10    Years: 3.00    Additional pack years: 0.00     Total pack years: 0.30    Types: Cigarettes   Smokeless tobacco: Never   Tobacco comments:    1-2 a day  Vaping Use   Vaping Use: Never used  Substance Use Topics   Alcohol use: Yes    Alcohol/week: 0.0 standard drinks of alcohol    Comment: Ocassionally   Drug use: Yes    Frequency: 2.0 times per week    Types: Marijuana, MDMA (Ecstacy)    Family History  Problem Relation Age of Onset   Throat cancer Mother        smoker   Throat cancer Father        smoker   Hypertension Father    Cirrhosis Father    Hypertension Other    Cancer Other        smoker   Diabetes Other    Asthma Other     Allergies  Allergen Reactions   Bee Venom Anaphylaxis, Shortness Of Breath and Swelling   Ferrlecit [Na Ferric Gluc Cplx In Sucrose] Swelling    Swelling and puffiness in hands and legs after Ferrlecit 250 mg infused over 60 minutes (possibly infusion related reaction)   Morphine And Codeine Hives and Itching    Just can't take "morphine", vicodin is OK    Physical Exam: Constitutional: in no apparent  distress  Vitals:   12/22/22 0823 12/22/22 1458  BP: 116/85 106/75  Pulse: (!) 110 (!) 102  Resp: 18 18  Temp: 97.9 F (36.6 C) 98.2 F (36.8 C)  SpO2: 94% 99%   EYES: anicteric ENMT:no thrush Respiratory: normal respiratory effort Abdomen: abdominal/suprapubic area with wound, purulent drainage, tenderness  Lab Results  Component Value Date   WBC 2.5 (L) 12/22/2022   HGB 9.1 (L) 12/22/2022   HCT 29.9 (L) 12/22/2022   MCV 88.2 12/22/2022   PLT 96 (L) 12/22/2022    Lab Results  Component Value Date   CREATININE 0.87 12/22/2022   BUN 10 12/22/2022   NA 135 12/22/2022   K 3.8 12/22/2022   CL 105 12/22/2022   CO2 23 12/22/2022    Lab Results  Component Value Date   ALT 12 12/20/2022   AST 27 12/20/2022   ALKPHOS 80 12/20/2022     Microbiology: Recent Results (from the past 240 hour(s))  Culture, blood (routine x 2)     Status: None (Preliminary result)    Collection Time: 12/21/22  1:25 AM   Specimen: BLOOD  Result Value Ref Range Status   Specimen Description   Final    BLOOD LEFT ANTECUBITAL Performed at [facility], [address] [place], [place] [address]    Special Requests   Final    BOTTLES DRAWN AEROBIC AND ANAEROBIC Blood Culture adequate volume Performed at [id number], [address] [place], [place] [address]    Culture   Final    NO GROWTH 1 DAY Performed at [facility], [address], [place], [place] [address]    Report Status PENDING  Incomplete  Culture, blood (routine x 2)     Status: None (Preliminary result)   Collection Time: 12/21/22  1:30 AM   Specimen: BLOOD  Result Value Ref Range Status   Specimen Description   Final    BLOOD RIGHT ANTECUBITAL Performed at [facility], [address] [place], [place] [address]    Special Requests   Final    BOTTLES DRAWN AEROBIC AND ANAEROBIC Blood Culture results may not be optimal due to an inadequate volume of blood received in culture bottles Performed at [facility], [address] [place], [place] [address]    Culture   Final    NO GROWTH 1 DAY Performed at [facility], [address], [place], [place] [address]    Report Status PENDING  Incomplete    Gardiner Barefoot, MD [facility] [email or web address] 12/22/2022, 5:35 PM

## 2022-12-22 NOTE — Hospital Course (Addendum)
The patient is a 42 year old African-American thin female with a past medical history significant for below 2 HIV AIDS, Kaposi's sarcoma, peripheral neuropathy, nephrolithiasis, anxiety and depression as well as other comorbidities who presents with complaints of lower abdominal pain.  Of note she was recently admitted to get refill on her Biktarvy due to her change in insurance and reports abnormal purpuric areas and significantly worse over last few weeks.  In the last 2 weeks she reports that has been draining yellow-green fluid and that she reports having fevers at home of 100-102 Fahrenheit.  She also notes that she has bilateral lower extremity pain which is chronic in nature.  She is brought to the ED and further workup was done and she was given IV fluids with a 1 L normal saline bolus, Ativan, Dilaudid, Benadryl and hydroxyzine.  She was initiated on antibiotics with IV Zosyn and is being continued.  Wound care has been consulted as well as ID.  Assessment and Plan:  Cellulitis of Suprapubic Region -Acute. Patient presented with complaints of pain in her legs and lower abdomen.   -Noted to have wound of the perineum with induration and drainage.   -CT revealed soft tissue wound with concern for infection without abscess present. -Admitted to MedSurg bed -Follow-up blood cultures -Continue empiric antibiotics of Zosyn -Oxycodone as needed for pain -Wound care consulted, we will follow-up for further recommendations -ID consulted given for further evaluation and recommendations   Pancytopenia -Acute on chronic.   -On admission WBC 2.5 and hemoglobin 10.2 which appears similar to prior.   -However, platelet count or acutely low at 134.  Review of records note platelet count intermittently has been as low as 78 approximately 2 years ago. -CBC Trend: Recent Labs  Lab 12/20/22 1919 12/22/22 0036  WBC 2.6* 2.5*  HGB 10.2* 9.1*  HCT 33.4* 29.9*  MCV 86.5 88.2  PLT 134* 96*  -Check  Anemia Panel in the AM -Continue to Monitor and Trend and Continue to Monitor for S/Sx of Bleeding; No overt bleeding noted -Repeat CBC in the AM   Hypokalemia Hypomagnesemia Hypocalcemia -Acute. Electrolyte Trend: Recent Labs  Lab 12/20/22 1919 12/20/22 1919 12/21/22 0912 12/22/22 0036  NA 137  --  137 135  K 3.2*  --  3.1* 3.8  MG  --    < > 1.5* 1.9  CALCIUM 7.9*  --  7.4* 7.9*   < > = values in this interval not displayed.  -Given potassium chloride 40 meq p.o, Calcium gluconate 2 g IV, and magnesium sulfate 2 g IV -Continue to monitor and replace as Necessary -Repeat CMP, Mag, Phos levels in the AM    HIV/AIDS Kaposi sarcoma -Diagnosed back in 2005.   -Patient has nodular lesions bilateral upper and lower extremities.   -Intermittent adherence with Biktarvy treatment CD4 undetectable and viral load 142k in April 2024.   -On last office visit from 4/29 patient was recommended to continue Biktarvy, Bactrim, and azithromycin 200 mg q. weekly for MAC prevention. -Continue Biktarvy and ID consulted for further evaluation and recommendations    Anxiety and Depression -Hydroxyzine 25 mg po TIDprn as noted   Bilateral leg pain -Possibly related with electrolyte abnormalities   Nephrolithiasis -Chronic. Patient noted to have bilateral nephrolithiasis with largest measuring up to 5 mm without any obstructing stones appreciated at this time. -C/w Hydration and outpatient Urology follow up  Hypoalbuminemia -[name] Albumin Trend: Recent Labs  Lab 12/20/22 1919  ALBUMIN 2.9*  -Continue to Monitor and  Trend and repeat CMP in the AM

## 2022-12-22 NOTE — Progress Notes (Signed)
PROGRESS NOTE    Beverly [name]  GNF:[id number] DOB: [birth year] DOA: 12/20/2022 PCP: Pcp, No   Brief Narrative:  The patient is a 42 year old African-American thin female with a past medical history significant for below 2 HIV AIDS, Kaposi's sarcoma, peripheral neuropathy, nephrolithiasis, anxiety and depression as well as other comorbidities who presents with complaints of lower abdominal pain.  Of note she was recently admitted to get refill on her Biktarvy due to her change in insurance and reports abnormal purpuric areas and significantly worse over last few weeks.  In the last 2 weeks she reports that has been draining yellow-green fluid and that she reports having fevers at home of 100-102 Fahrenheit.  She also notes that she has bilateral lower extremity pain which is chronic in nature.  She is brought to the ED and further workup was done and she was given IV fluids with a 1 L normal saline bolus, Ativan, Dilaudid, Benadryl and hydroxyzine.  She was initiated on antibiotics with IV Zosyn and is being continued.  Wound care has been consulted as well as ID.  Assessment and Plan:  Cellulitis of Suprapubic Region -Acute. Patient presented with complaints of pain in her legs and lower abdomen.   -Noted to have wound of the perineum with induration and drainage.   -CT revealed soft tissue wound with concern for infection without abscess present. -Admitted to MedSurg bed -Follow-up blood cultures -Continue empiric antibiotics of Zosyn -Oxycodone as needed for pain -Wound care consulted, we will follow-up for further recommendations -ID consulted given for further evaluation and recommendations   Pancytopenia -Acute on chronic.   -On admission WBC 2.5 and hemoglobin 10.2 which appears similar to prior.   -However, platelet count or acutely low at 134.  Review of records note platelet count intermittently has been as low as 78 approximately 2 years ago. -CBC Trend: Recent Labs  Lab  12/20/22 1919 12/22/22 0036  WBC 2.6* 2.5*  HGB 10.2* 9.1*  HCT 33.4* 29.9*  MCV 86.5 88.2  PLT 134* 96*  -Check Anemia Panel in the AM -Continue to Monitor and Trend and Continue to Monitor for S/Sx of Bleeding; No overt bleeding noted -Repeat CBC in the AM   Hypokalemia Hypomagnesemia Hypocalcemia -Acute. Electrolyte Trend: Recent Labs  Lab 12/20/22 1919 12/20/22 1919 12/21/22 0912 12/22/22 0036  NA 137  --  137 135  K 3.2*  --  3.1* 3.8  MG  --    < > 1.5* 1.9  CALCIUM 7.9*  --  7.4* 7.9*   < > = values in this interval not displayed.  -Given potassium chloride 40 meq p.o, Calcium gluconate 2 g IV, and magnesium sulfate 2 g IV -Continue to monitor and replace as Necessary -Repeat CMP, Mag, Phos levels in the AM    HIV/AIDS Kaposi sarcoma -Diagnosed back in 2005.   -Patient has nodular lesions bilateral upper and lower extremities.   -Intermittent adherence with Biktarvy treatment CD4 undetectable and viral load 142k in April 2024.   -On last office visit from 4/29 patient was recommended to continue Biktarvy, Bactrim, and azithromycin 200 mg q. weekly for MAC prevention. -Continue Biktarvy and ID consulted for further evaluation and recommendations    Anxiety and Depression -Hydroxyzine 25 mg po TIDprn as noted   Bilateral leg pain -Possibly related with electrolyte abnormalities   Nephrolithiasis -Chronic. Patient noted to have bilateral nephrolithiasis with largest measuring up to 5 mm without any obstructing stones appreciated at this time. -C/w Hydration and outpatient  Urology follow up  Hypoalbuminemia -[name] Albumin Trend: Recent Labs  Lab 12/20/22 1919  ALBUMIN 2.9*  -Continue to Monitor and Trend and repeat CMP in the AM   DVT prophylaxis: enoxaparin (LOVENOX) injection 40 mg Start: 12/21/22 0830    Code Status: Full Code Family Communication: No family present at bedside  Disposition Plan:  Level of care: Med-Surg Status is:  Inpatient Remains inpatient appropriate because: Needs further clinical Improvement and clearance by ID   Consultants:  Infectious Diseases  Procedures:  As delineated as above  Antimicrobials:  Anti-infectives (From admission, onward)    Start     Dose/Rate Route Frequency Ordered Stop   12/21/22 1115  bictegravir-emtricitabine-tenofovir AF (BIKTARVY) 50-200-25 MG per tablet 1 tablet       Note to Pharmacy: Try to take at the same time each day with or without food.     1 tablet Oral Daily 12/21/22 1021     12/21/22 0045  piperacillin-tazobactam (ZOSYN) IVPB 3.375 g        3.375 g 12.5 mL/hr over 240 Minutes Intravenous Every 8 hours 12/21/22 0043         Subjective: Seen And examined at bedside and she is still having some discomfort in the lower abdomen.  Had some nausea earlier but no vomiting.  Still feels weak.  Denies other concerns or complaints at this time.  Objective: Vitals:   12/22/22 0014 12/22/22 0458 12/22/22 0823 12/22/22 1458  BP:  122/79 116/85 106/75  Pulse: (!) 102 (!) 101 (!) 110 (!) 102  Resp:  16 18 18   Temp:  99.1 F (37.3 C) 97.9 F (36.6 C) 98.2 F (36.8 C)  TempSrc:  Oral Oral Oral  SpO2: 100% 98% 94% 99%  Weight:      Height:        Intake/Output Summary (Last 24 hours) at 12/22/2022 1636 Last data filed at 12/22/2022 0913 Gross per 24 hour  Intake 240 ml  Output --  Net 240 ml   Filed Weights   12/20/22 1912 12/21/22 0641  Weight: 54.4 kg 51.5 kg   Examination: Physical Exam:  Constitutional: Chronically ill-appearing African-American female asses, normal ROM, no appreciable thyromegaly Respiratory: Diminished to auscultation bilaterally, no wheezing, rales, rhonchi or crackles. Normal respiratory effort and patient is not tachypenic. No accessory muscle use.  Unlabored breathing Cardiovascular: RRR, no murmurs / rubs / gallops. S1 and S2 auscultated.  Abdomen: Soft, non-tender, non-distended.  Bowel sounds positive.  GU:  Deferred. Musculoskeletal: No clubbing / cyanosis of digits/nails. No joint deformity upper and lower extremities. .  Skin:Lower suprapubic wound covered Neurologic: CN 2-12 grossly intact with no focal deficits. Romberg sign and cerebellar reflexes not assessed.  Psychiatric: Normal judgment and insight. Alert and oriented x 3. Normal mood and appropriate affect.   Data Reviewed: I have personally reviewed following labs and imaging studies  CBC: Recent Labs  Lab 12/20/22 1919 12/22/22 0036  WBC 2.6* 2.5*  NEUTROABS 1.3*  --   HGB 10.2* 9.1*  HCT 33.4* 29.9*  MCV 86.5 88.2  PLT 134* 96*   Basic Metabolic Panel: Recent Labs  Lab 12/20/22 1919 12/21/22 0912 12/22/22 0036  NA 137 137 135  K 3.2* 3.1* 3.8  CL 106 108 105  CO2 24 23 23   GLUCOSE 94 80 104*  BUN 11 9 10   CREATININE 0.60 0.54 0.87  CALCIUM 7.9* 7.4* 7.9*  MG  --  1.5* 1.9   GFR: Estimated Creatinine Clearance: 64.9 mL/min (by C-G formula  based on SCr of 0.87 mg/dL). Liver Function Tests: Recent Labs  Lab 12/20/22 1919  AST 27  ALT 12  ALKPHOS 80  BILITOT 0.6  PROT 8.1  ALBUMIN 2.9*   No results for input(s): "LIPASE", "AMYLASE" in the last 168 hours. No results for input(s): "AMMONIA" in the last 168 hours. Coagulation Profile: No results for input(s): "INR", "PROTIME" in the last 168 hours. Cardiac Enzymes: No results for input(s): "CKTOTAL", "CKMB", "CKMBINDEX", "TROPONINI" in the last 168 hours. BNP (last 3 results) No results for input(s): "PROBNP" in the last 8760 hours. HbA1C: No results for input(s): "HGBA1C" in the last 72 hours. CBG: No results for input(s): "GLUCAP" in the last 168 hours. Lipid Profile: No results for input(s): "CHOL", "HDL", "LDLCALC", "TRIG", "CHOLHDL", "LDLDIRECT" in the last 72 hours. Thyroid Function Tests: No results for input(s): "TSH", "T4TOTAL", "FREET4", "T3FREE", "THYROIDAB" in the last 72 hours. Anemia Panel: No results for input(s): "VITAMINB12",  "FOLATE", "FERRITIN", "TIBC", "IRON", "RETICCTPCT" in the last 72 hours. Sepsis Labs: Recent Labs  Lab 12/20/22 2237  LATICACIDVEN 1.6    Recent Results (from the past 240 hour(s))  Culture, blood (routine x 2)     Status: None (Preliminary result)   Collection Time: 12/21/22  1:25 AM   Specimen: BLOOD  Result Value Ref Range Status   Specimen Description   Final    BLOOD LEFT ANTECUBITAL Performed at [facility], [address] [place], [place] [address]    Special Requests   Final    BOTTLES DRAWN AEROBIC AND ANAEROBIC Blood Culture adequate volume Performed at [facility], [address] [place], [place] [address]    Culture   Final    NO GROWTH 1 DAY Performed at [facility], [address] [place], [place] [address]    Report Status PENDING  Incomplete  Culture, blood (routine x 2)     Status: None (Preliminary result)   Collection Time: 12/21/22  1:30 AM   Specimen: BLOOD  Result Value Ref Range Status   Specimen Description   Final    BLOOD RIGHT ANTECUBITAL Performed at [facility], [address] [place], [place] [address]    Special Requests   Final    BOTTLES DRAWN AEROBIC AND ANAEROBIC Blood Culture results may not be optimal due to an inadequate volume of blood received in culture bottles Performed at [facility], [address] [place], [place] [address]    Culture   Final    NO GROWTH 1 DAY Performed at [facility], [address], [place], [place] [address]    Report Status PENDING  Incomplete    Radiology Studies: CT ABDOMEN PELVIS W CONTRAST  Result Date: 12/21/2022 CLINICAL DATA:  Lower abdominal pain, fever, perineal wound EXAM: CT ABDOMEN AND PELVIS WITH CONTRAST TECHNIQUE: Multidetector CT imaging of the abdomen and pelvis was performed using the standard protocol following bolus administration of intravenous contrast. RADIATION DOSE REDUCTION: This exam was performed according to  the departmental dose-optimization program which includes automated exposure control, adjustment of the mA and/or kV according to patient size and/or use of iterative reconstruction technique. CONTRAST:  OMNIPAQUE IOHEXOL 300 MG/ML  SOLN COMPARISON:  10/26/2021 FINDINGS: Lower chest: Lung bases are clear. Hepatobiliary: Liver is notable for focal fat/altered perfusion along the falciform ligament. Gallbladder is unremarkable. No intrahepatic or extrahepatic duct dilatation. Pancreas: Within normal limits. Spleen: Within normal limits. Adrenals/Urinary Tract: Adrenal  glands are within normal limits. 11 mm cyst in the medial right lower kidney (series 2/image 33), benign (Bosniak I). No follow-up is recommended. Multiple nonobstructing bilateral renal calculi, 4-5 on the right and at least 2 on the left, measuring up to 5 mm in the right upper kidney (series 2/image 28). No ureteral or bladder calculi. No hydronephrosis. 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 Bladder is within normal limits. Stomach/Bowel: Stomach is within normal limits. No evidence of bowel obstruction. Appendix is not discretely visualized. No colonic wall thickening or inflammatory changes. Vascular/Lymphatic: No evidence of abdominal aortic aneurysm. No suspicious abdominopelvic lymphadenopathy. Reproductive: Uterus is Within normal limits. Right ovary is within normal limits. Left ovary is notable for a 3.3 cm simple ovarian cyst/follicle (series 2/image 61), benign. No follow-up is recommended. Other: No abdominopelvic ascites. Musculoskeletal: Very mild degenerative changes of the visualized thoracolumbar spine. Irregular cutaneous thickening/nodularity along the right paramidline lower abdominal wall (series 2/image 59), similar to the prior. New anterior perineal subcutaneous edema/stranding (series 2/image 34), suggesting cellulitis. No associated fluid collection/abscess. IMPRESSION: Suspected anterior perineal  cellulitis. No associated fluid collection/abscess. Cutaneous nodular thickening/lesion along the lower abdominal wall, similar to the prior. Multiple nonobstructing bilateral renal calculi, measuring up to 5 mm in the right upper kidney. No ureteral or bladder calculi. No hydronephrosis. Electronically Signed   By: Charline Bills M.D.   On: 12/21/2022 01:58    Scheduled Meds:  bictegravir-emtricitabine-tenofovir AF  1 tablet Oral Daily   enoxaparin (LOVENOX) injection  40 mg Subcutaneous Q24H   saccharomyces boulardii  250 mg Oral BID   sodium chloride flush  3 mL Intravenous Q12H   Continuous Infusions:  piperacillin-tazobactam (ZOSYN)  IV 3.375 g (12/22/22 0810)    LOS: 1 day   Marguerita Merles, DO [facility] Available via Epic secure chat 7am-7pm After these hours, please refer to coverage provider listed on [email or web address] 12/22/2022, 4:36 PM

## 2022-12-23 DIAGNOSIS — L03311 Cellulitis of abdominal wall: Secondary | ICD-10-CM

## 2022-12-23 LAB — CBC WITH DIFFERENTIAL/PLATELET
Abs Immature Granulocytes: 0 10*3/uL (ref 0.00–0.07)
Basophils Absolute: 0 10*3/uL (ref 0.0–0.1)
Basophils Relative: 0 %
Eosinophils Absolute: 0.5 10*3/uL (ref 0.0–0.5)
Eosinophils Relative: 24 %
HCT: 30.9 % — ABNORMAL LOW (ref 36.0–46.0)
Hemoglobin: 9.6 g/dL — ABNORMAL LOW (ref 12.0–15.0)
Lymphocytes Relative: 11 %
Lymphs Abs: 0.2 10*3/uL — ABNORMAL LOW (ref 0.7–4.0)
MCH: 26.4 pg (ref 26.0–34.0)
MCHC: 31.1 g/dL (ref 30.0–36.0)
MCV: 84.9 fL (ref 80.0–100.0)
Monocytes Absolute: 0 10*3/uL — ABNORMAL LOW (ref 0.1–1.0)
Monocytes Relative: 0 %
Neutro Abs: 1.4 10*3/uL — ABNORMAL LOW (ref 1.7–7.7)
Neutrophils Relative %: 65 %
Platelets: 113 10*3/uL — ABNORMAL LOW (ref 150–400)
RBC: 3.64 MIL/uL — ABNORMAL LOW (ref 3.87–5.11)
RDW: 15.9 % — ABNORMAL HIGH (ref 11.5–15.5)
WBC: 2.2 10*3/uL — ABNORMAL LOW (ref 4.0–10.5)
nRBC: 0 % (ref 0.0–0.2)

## 2022-12-23 LAB — FOLATE: Folate: 7.5 ng/mL (ref >5.9)

## 2022-12-23 LAB — COMPREHENSIVE METABOLIC PANEL
ALT: 11 U/L (ref 0–44)
AST: 29 U/L (ref 15–41)
Albumin: 2.4 g/dL — ABNORMAL LOW (ref 3.5–5.0)
Alkaline Phosphatase: 64 U/L (ref 38–126)
Anion gap: 5 (ref 5–15)
BUN: 12 mg/dL (ref 6–20)
CO2: 28 mmol/L (ref 22–32)
Calcium: 7.9 mg/dL — ABNORMAL LOW (ref 8.9–10.3)
Chloride: 105 mmol/L (ref 98–111)
Creatinine, Ser: 0.77 mg/dL (ref 0.44–1.00)
GFR, Estimated: >60 mL/min (ref >60)
Glucose, Bld: 95 mg/dL (ref 70–99)
Potassium: 4.1 mmol/L (ref 3.5–5.1)
Sodium: 138 mmol/L (ref 135–145)
Total Bilirubin: 0.7 mg/dL (ref 0.3–1.2)
Total Protein: 7.6 g/dL (ref 6.5–8.1)

## 2022-12-23 LAB — RETICULOCYTES
Immature Retic Fract: 8.1 % (ref 2.3–15.9)
RBC.: 3.61 MIL/uL — ABNORMAL LOW (ref 3.87–5.11)
Retic Count, Absolute: 18.4 10*3/uL — ABNORMAL LOW (ref 19.0–186.0)
Retic Ct Pct: 0.5 % (ref 0.4–3.1)

## 2022-12-23 LAB — IRON AND TIBC
Iron: 30 ug/dL (ref 28–170)
Saturation Ratios: 11 % (ref 10.4–31.8)
TIBC: 281 ug/dL (ref 250–450)
UIBC: 251 ug/dL

## 2022-12-23 LAB — PHOSPHORUS: Phosphorus: 3.6 mg/dL (ref 2.5–4.6)

## 2022-12-23 LAB — FERRITIN: Ferritin: 77 ng/mL (ref 11–307)

## 2022-12-23 LAB — MAGNESIUM: Magnesium: 1.6 mg/dL — ABNORMAL LOW (ref 1.7–2.4)

## 2022-12-23 LAB — VITAMIN B12: Vitamin B-12: 314 pg/mL (ref 180–914)

## 2022-12-23 MED ORDER — ACETAMINOPHEN 325 MG PO TABS
650.0000 mg | ORAL_TABLET | Freq: Four times a day (QID) | ORAL | Status: DC | PRN
  Administered 2022-12-26 – 2022-12-28 (×3): 650 mg via ORAL
  Filled 2022-12-23 (×3): qty 2

## 2022-12-23 MED ORDER — VITAMIN B-12 1000 MCG PO TABS
1000.0000 ug | ORAL_TABLET | Freq: Every day | ORAL | Status: DC
  Administered 2022-12-23 – 2022-12-28 (×6): 1000 ug via ORAL
  Filled 2022-12-23 (×6): qty 1

## 2022-12-23 MED ORDER — MAGNESIUM SULFATE 2 GM/50ML IV SOLN
2.0000 g | Freq: Once | INTRAVENOUS | Status: AC
  Administered 2022-12-23: 2 g via INTRAVENOUS
  Filled 2022-12-23: qty 50

## 2022-12-23 NOTE — Plan of Care (Signed)

## 2022-12-23 NOTE — Progress Notes (Signed)
    [facility]   Reason for visit: Follow up on abdominal wound infection  Interval History: complaint of pain in the area, no fever.  Day 4 antibiotics  Physical Exam: Constitutional:  Vitals:   12/23/22 0440 12/23/22 0823  BP: 116/74 118/80  Pulse: 98 (!) 101  Resp:  17  Temp: 98.4 F (36.9 C) 98.7 F (37.1 C)  SpO2: 99% 100%   patient appears in NAD Respiratory: Normal respiratory effort Skin: abdominal wound with purulence, somewhat decreased.    Review of Systems: Constitutional: negative for fevers and chills  Lab Results  Component Value Date   WBC 2.2 (L) 12/23/2022   HGB 9.6 (L) 12/23/2022   HCT 30.9 (L) 12/23/2022   MCV 84.9 12/23/2022   PLT 113 (L) 12/23/2022    Lab Results  Component Value Date   CREATININE 0.77 12/23/2022   BUN 12 12/23/2022   NA 138 12/23/2022   K 4.1 12/23/2022   CL 105 12/23/2022   CO2 28 12/23/2022    Lab Results  Component Value Date   ALT 11 12/23/2022   AST 29 12/23/2022   ALKPHOS 64 12/23/2022     Microbiology: Recent Results (from the past 240 hour(s))  Culture, blood (routine x 2)     Status: None (Preliminary result)   Collection Time: 12/21/22  1:25 AM   Specimen: BLOOD  Result Value Ref Range Status   Specimen Description   Final    BLOOD LEFT ANTECUBITAL Performed at [facility], [address] [place], [place] [address]    Special Requests   Final    BOTTLES DRAWN AEROBIC AND ANAEROBIC Blood Culture adequate volume Performed at [facility], [address] [place], [place] [address]    Culture   Final    NO GROWTH 2 DAYS Performed at [facility], [address] [place], [place] [address]    Report Status PENDING  Incomplete  Culture, blood (routine x 2)     Status: None (Preliminary result)   Collection Time: 12/21/22  1:30 AM   Specimen: BLOOD  Result Value Ref Range Status   Specimen Description   Final    BLOOD RIGHT  ANTECUBITAL Performed at [facility], [address] [place], [place] [address]    Special Requests   Final    BOTTLES DRAWN AEROBIC AND ANAEROBIC Blood Culture results may not be optimal due to an inadequate volume of blood received in culture bottles Performed at [facility], [address] [place], [place] [address]    Culture   Final    NO GROWTH 2 DAYS Performed at [facility], [address], [place], [place] [address]    Report Status PENDING  Incomplete    Impression/Plan:  1. Abdominal wound - purulent, tender, c/w soft tissue infection.  On ampicillin/sulbactam.  Seems to have some improvement since yesterday.   Continue with antibiotics. Can use Augmentin at discharge for 7-10 days if she continues to get better  2.  HIV - poorly controlled but getting her ARVs here.  Will continue.  She can get medications through [facility] per note from [facility].    3.  OI prophylaxis - she will need to continue with Bactrim daily for PJP prophylaxis.    Dr. Daiva Eves will continue to follow this weekend.

## 2022-12-23 NOTE — Progress Notes (Signed)
   Beverly [name]  ZOX:[id number] DOB: [birth year] DOA: 12/20/2022 PCP: Pcp, No    Brief Narrative:  42 year old with a history of HIV/AIDS, Kaposi's sarcoma, peripheral neuropathy, nephrolithiasis, and anxiety/depression who presented to the ER 12/21/2022 with lower abdominal pain associated with 2 weeks of a rash which has begun to drain yellow-green fluid and was associated with a fever up to 102 at home.  Goals of Care:  Code Status: Full Code   DVT prophylaxis: Lovenox  Interim Hx: Afebrile since admission.  Vital signs stable.  Making progress on empiric antibiotic therapy.  No new complaints today.  Assessment & Plan:  Cellulitis of suprapubic region At presentation exam revealed a wound of the low abdomen/suprapubic region with induration and drainage -CT pelvis revealed soft tissue wound with no abscess present -patient is being treated with empiric antibiotic therapy - wound care following -ID following  Pancytopenia Acute on chronic -appears stable at present  Electrolyte abnormalities -hypokalemia, hypomagnesemia Potassium has stabilized -magnesium remains low and will be supplemented further   HIV/AIDS -Kaposi's sarcoma Initially diagnosed 2005 -intermittent adherence to Biktarvy therapy -care per ID  Anxiety/depression Continue Vistaril as needed  Chronic nephrolithiasis No evidence of obstruction at present  Family Communication: No family present at time of exam Disposition: Anticipate discharge home in 24-48 hours   Objective: Blood pressure 118/80, pulse (!) 101, temperature 98.7 F (37.1 C), temperature source Oral, resp. rate 17, height 5\' 1"  (1.549 m), weight 51.5 kg, SpO2 100 %.  Intake/Output Summary (Last 24 hours) at 12/23/2022 1043 Last data filed at 12/23/2022 0900 Gross per 24 hour  Intake 483.79 ml  Output --  Net 483.79 ml   Filed Weights   12/20/22 1912 12/21/22 0641  Weight: 54.4 kg 51.5 kg    Examination: General: No acute respiratory  distress Lungs: Clear to auscultation bilaterally without wheezes or crackles Cardiovascular: Regular rate and rhythm without murmur gallop or rub normal S1 and S2 Abdomen: Nontender, nondistended, soft, bowel sounds positive, no rebound, no ascites, no appreciable mass Extremities: No significant cyanosis, clubbing, or edema bilateral lower extremities  CBC: Recent Labs  Lab 12/20/22 1919 12/22/22 0036 12/23/22 0218  WBC 2.6* 2.5* 2.2*  NEUTROABS 1.3*  --  1.4*  HGB 10.2* 9.1* 9.6*  HCT 33.4* 29.9* 30.9*  MCV 86.5 88.2 84.9  PLT 134* 96* 113*   Basic Metabolic Panel: Recent Labs  Lab 12/21/22 0912 12/22/22 0036 12/23/22 0218  NA 137 135 138  K 3.1* 3.8 4.1  CL 108 105 105  CO2 23 23 28   GLUCOSE 80 104* 95  BUN 9 10 12   CREATININE 0.54 0.87 0.77  CALCIUM 7.4* 7.9* 7.9*  MG 1.5* 1.9 1.6*  PHOS  --   --  3.6   GFR: Estimated Creatinine Clearance: 70.5 mL/min (by C-G formula based on SCr of 0.77 mg/dL).   Scheduled Meds:  bictegravir-emtricitabine-tenofovir AF  1 tablet Oral Daily   enoxaparin (LOVENOX) injection  40 mg Subcutaneous Q24H   saccharomyces boulardii  250 mg Oral BID   sodium chloride flush  3 mL Intravenous Q12H   Continuous Infusions:  ampicillin-sulbactam (UNASYN) IV 3 g (12/23/22 0932)     LOS: 2 days   Lonia Blood, MD [facility] Office  [phone] Pager - Text Page per Loretha Stapler  If 7PM-7AM, please contact night-coverage per Amion 12/23/2022, 10:43 AM

## 2022-12-24 LAB — BASIC METABOLIC PANEL
Anion gap: 8 (ref 5–15)
BUN: 9 mg/dL (ref 6–20)
CO2: 23 mmol/L (ref 22–32)
Calcium: 8 mg/dL — ABNORMAL LOW (ref 8.9–10.3)
Chloride: 106 mmol/L (ref 98–111)
Creatinine, Ser: 0.62 mg/dL (ref 0.44–1.00)
GFR, Estimated: >60 mL/min (ref >60)
Glucose, Bld: 87 mg/dL (ref 70–99)
Potassium: 4.1 mmol/L (ref 3.5–5.1)
Sodium: 137 mmol/L (ref 135–145)

## 2022-12-24 LAB — CBC
HCT: 29.4 % — ABNORMAL LOW (ref 36.0–46.0)
Hemoglobin: 9.1 g/dL — ABNORMAL LOW (ref 12.0–15.0)
MCH: 26.2 pg (ref 26.0–34.0)
MCHC: 31 g/dL (ref 30.0–36.0)
MCV: 84.7 fL (ref 80.0–100.0)
Platelets: 97 10*3/uL — ABNORMAL LOW (ref 150–400)
RBC: 3.47 MIL/uL — ABNORMAL LOW (ref 3.87–5.11)
RDW: 15.6 % — ABNORMAL HIGH (ref 11.5–15.5)
WBC: 2.7 10*3/uL — ABNORMAL LOW (ref 4.0–10.5)
nRBC: 0 % (ref 0.0–0.2)

## 2022-12-24 LAB — MAGNESIUM: Magnesium: 1.8 mg/dL (ref 1.7–2.4)

## 2022-12-24 MED ORDER — OXYCODONE HCL 5 MG PO TABS: 5.0000 mg | ORAL_TABLET | ORAL | 0 refills | Status: DC | PRN

## 2022-12-24 MED ORDER — AMOXICILLIN-POT CLAVULANATE 875-125 MG PO TABS
1.0000 | ORAL_TABLET | Freq: Two times a day (BID) | ORAL | Status: DC
  Administered 2022-12-24 – 2022-12-28 (×9): 1 via ORAL
  Filled 2022-12-24 (×9): qty 1

## 2022-12-24 MED ORDER — AMOXICILLIN-POT CLAVULANATE 875-125 MG PO TABS: 1.0000 | ORAL_TABLET | Freq: Two times a day (BID) | ORAL | 0 refills | Status: DC

## 2022-12-24 MED ORDER — ACETAMINOPHEN 325 MG PO TABS: 650.0000 mg | ORAL_TABLET | Freq: Four times a day (QID) | ORAL | Status: AC | PRN

## 2022-12-24 NOTE — Progress Notes (Signed)
Beverly [name]  ZOX:[id number] DOB: 10-07-1980 DOA: 12/20/2022 PCP: Pcp, No    Brief Narrative:  42 year old with a history of HIV/AIDS, Kaposi's sarcoma, peripheral neuropathy, nephrolithiasis, and anxiety/depression who presented to the ER 12/21/2022 with lower abdominal pain associated with 2 weeks of a rash which has begun to drain yellow-green fluid and was associated with a fever up to 102 at home.  Goals of Care:  Code Status: Full Code   DVT prophylaxis: Lovenox  Interim Hx: No acute events recorded overnight.  Afebrile.  Vital signs stable.  Abdominal wound much improved today.  The patient is medically stable and the plan was to discharge her home today but she has no insurance and is homeless and given that our outpatient pharmacy is closed there is no way for me to provide her with her needed outpatient medications at discharge.  Assessment & Plan:  Cellulitis of suprapubic region At presentation exam revealed a wound of the low abdomen/suprapubic region with induration and drainage -CT pelvis revealed soft tissue wound with no abscess present -patient is improving nicely with empiric antibiotic therapy - wound care following -ID following -transitioned to oral Augmentin   Pancytopenia Acute on chronic -appears stable at present -felt to be related to uncontrolled HIV  Electrolyte abnormalities -hypokalemia, hypomagnesemia Potassium has stabilized -magnesium has stabilized with supplementation  HIV/AIDS -Kaposi's sarcoma Initially diagnosed 2005 -intermittent adherence to Biktarvy therapy -care per ID with patient encouraged to strictly comply with medical therapy  Anxiety/depression Continue Vistaril as needed  Chronic nephrolithiasis No evidence of obstruction at present  Family Communication: No family present at time of exam Disposition: Ready for discharge when outpatient medications can be procured   Objective: Blood pressure 121/89, pulse 98, temperature (!)  97.5 F (36.4 C), temperature source Oral, resp. rate 19, height 5\' 1"  (1.549 m), weight 51.5 kg, SpO2 96 %.  Intake/Output Summary (Last 24 hours) at 12/24/2022 0959 Last data filed at 12/23/2022 2318 Gross per 24 hour  Intake 697.12 ml  Output --  Net 697.12 ml    Filed Weights   12/20/22 1912 12/21/22 0641  Weight: 54.4 kg 51.5 kg    Examination: General: No acute respiratory distress Lungs: Clear to auscultation bilaterally  Cardiovascular: Regular rate and rhythm without murmur  Abdomen: Nontender, nondistended, soft, bowel sounds positive -lower abdominal/suprapubic wound with good granulation in the base and no purulent discharge with exam overall consistent with significant improvement Extremities: No significant edema bilateral lower extremities  CBC: Recent Labs  Lab 12/20/22 1919 12/22/22 0036 12/23/22 0218 12/24/22 0051  WBC 2.6* 2.5* 2.2* 2.7*  NEUTROABS 1.3*  --  1.4*  --   HGB 10.2* 9.1* 9.6* 9.1*  HCT 33.4* 29.9* 30.9* 29.4*  MCV 86.5 88.2 84.9 84.7  PLT 134* 96* 113* 97*    Basic Metabolic Panel: Recent Labs  Lab 12/22/22 0036 12/23/22 0218 12/24/22 0051  NA 135 138 137  K 3.8 4.1 4.1  CL 105 105 106  CO2 23 28 23   GLUCOSE 104* 95 87  BUN 10 12 9   CREATININE 0.87 0.77 0.62  CALCIUM 7.9* 7.9* 8.0*  MG 1.9 1.6* 1.8  PHOS  --  3.6  --     GFR: Estimated Creatinine Clearance: 70.5 mL/min (by C-G formula based on SCr of 0.62 mg/dL).   Scheduled Meds:  bictegravir-emtricitabine-tenofovir AF  1 tablet Oral Daily   cyanocobalamin  1,000 mcg Oral Daily   enoxaparin (LOVENOX) injection  40 mg Subcutaneous Q24H   saccharomyces  boulardii  250 mg Oral BID   sodium chloride flush  3 mL Intravenous Q12H   Continuous Infusions:  ampicillin-sulbactam (UNASYN) IV 3 g (12/24/22 0802)     LOS: 3 days   Lonia Blood, MD [facility] Office  [phone] Pager - Text Page per Loretha Stapler  If 7PM-7AM, please contact night-coverage per  Amion 12/24/2022, 9:59 AM

## 2022-12-24 NOTE — TOC Progression Note (Signed)
Transition of Care [facility]) - Progression Note    Patient Details  Name: Beverly [name] MRN: [id number] Date of Birth: 1980/10/16  [facility]) CM/SW Contact  Epifanio Lesches, RN Phone Number: 12/24/2022, 2:21 PM  Clinical Narrative:    Pt uninsured. Limited income. NCM requested d/c MD  to send RX meds to [facility] pharmacy. Per previous CM pt has been approved for uninsured [facility] pharmacy will provide the 1st 30 day supply. TOC pharmacy closed today. Pt to d/c to home on tomorrow. Match Letter entered to assist with RX med cost.  TOC following and will assist with needs...  Expected Discharge Plan: Home/Self Care Barriers to Discharge: Continued Medical Work up  Expected Discharge Plan and Services   Discharge Planning Services: CM Consult, Medication Assistance   Living arrangements for the past 2 months: Homeless (Currently staying with brother) Expected Discharge Date: 12/24/22                                     Social Determinants of Health (SDOH) Interventions SDOH Screenings   Depression (PHQ2-9): High Risk (10/19/2022)  Tobacco Use: High Risk (10/19/2022)    Readmission Risk Interventions     No data to display

## 2022-12-24 NOTE — Progress Notes (Addendum)
   12/24/22   To Whom it may concern,  Beverly [name] was hospitalized at [facility] from 12/20/2022 through 12/28/2022.  She has been cleared to return to work on but not before 12/30/2022 with no medical restrictions.    Should you have further questions please feel free to contact our office.  Be advised that no medical information will be divulged without a signed HIPA release from the patient.    Sincerely,  Lonia Blood, MD [facility] Office  [phone]

## 2022-12-25 DIAGNOSIS — F17210 Nicotine dependence, cigarettes, uncomplicated: Secondary | ICD-10-CM

## 2022-12-25 LAB — CULTURE, BLOOD (ROUTINE X 2)

## 2022-12-25 MED ORDER — LIDOCAINE HCL (PF) 2 % IJ SOLN
10.0000 mL | Freq: Once | INTRAMUSCULAR | Status: DC
  Filled 2022-12-25: qty 10

## 2022-12-25 MED ORDER — LORAZEPAM 2 MG/ML IJ SOLN
0.5000 mg | Freq: Once | INTRAMUSCULAR | Status: AC
  Administered 2022-12-25: 0.5 mg via INTRAVENOUS
  Filled 2022-12-25: qty 1

## 2022-12-25 MED ORDER — SILVER NITRATE-POT NITRATE 75-25 % EX MISC
10.0000 | Freq: Once | CUTANEOUS | Status: DC
  Filled 2022-12-25: qty 10

## 2022-12-25 NOTE — Progress Notes (Signed)
Beverly [name]  [id number]:[id number] DOB: April 12, 1981 DOA: 12/20/2022 PCP: Pcp, No    Brief Narrative:  42 year old with a history of HIV/AIDS, Kaposi's sarcoma, peripheral neuropathy, nephrolithiasis, and anxiety/depression who presented to the ER 12/21/2022 with lower abdominal pain associated with 2 weeks of a rash which has begun to drain yellow-green fluid and was associated with a fever up to 102 at home.  Goals of Care:  Code Status: Full Code   DVT prophylaxis: Lovenox  Interim Hx: Afebrile.  Vital signs stable.  ID has suggested asking General Surgery to biopsy the patient's wound in order to rule out fungal or AFB involvement.  She has no new complaints today and is resting comfortably.  Assessment & Plan:  Cellulitis of suprapubic region At presentation exam revealed a wound of the low abdomen/suprapubic region with induration and drainage - CT pelvis revealed soft tissue wound with no abscess present - ID following and feels the wound is worrisome for AFB or fungal involvement and has therefore requested General Surgery evaluation for biopsy and cultures - transitioned to oral Augmentin   Pancytopenia Acute on chronic -appears stable at present -felt to be related to uncontrolled HIV  Electrolyte abnormalities -hypokalemia, hypomagnesemia Potassium has stabilized - magnesium has stabilized with supplementation  HIV/AIDS - Kaposi's sarcoma Initially diagnosed 2005 -intermittent adherence to Biktarvy therapy -care per ID with patient encouraged to strictly comply with medical therapy  Anxiety/depression Continue Vistaril as needed  Chronic nephrolithiasis No evidence of obstruction at present  Family Communication: No family present at time of exam Disposition:    Objective: Blood pressure (!) 126/91, pulse 91, temperature 98 F (36.7 C), temperature source Oral, resp. rate 15, height 5\' 1"  (1.549 m), weight 51.5 kg, SpO2 100 %.  Intake/Output Summary (Last 24 hours) at  12/25/2022 1000 Last data filed at 12/24/2022 1030 Gross per 24 hour  Intake 480 ml  Output --  Net 480 ml    Filed Weights   12/20/22 1912 12/21/22 0641  Weight: 54.4 kg 51.5 kg    Examination: General: No acute respiratory distress Lungs: Clear to auscultation bilaterally  Cardiovascular: Regular rate and rhythm without murmur  Abdomen: Nontender, nondistended, soft, bowel sounds positive  - see ID note for pictures of wound  Extremities: No significant edema bilateral lower extremities  CBC: Recent Labs  Lab 12/20/22 1919 12/22/22 0036 12/23/22 0218 12/24/22 0051  WBC 2.6* 2.5* 2.2* 2.7*  NEUTROABS 1.3*  --  1.4*  --   HGB 10.2* 9.1* 9.6* 9.1*  HCT 33.4* 29.9* 30.9* 29.4*  MCV 86.5 88.2 84.9 84.7  PLT 134* 96* 113* 97*    Basic Metabolic Panel: Recent Labs  Lab 12/22/22 0036 12/23/22 0218 12/24/22 0051  NA 135 138 137  K 3.8 4.1 4.1  CL 105 105 106  CO2 23 28 23   GLUCOSE 104* 95 87  BUN 10 12 9   CREATININE 0.87 0.77 0.62  CALCIUM 7.9* 7.9* 8.0*  MG 1.9 1.6* 1.8  PHOS  --  3.6  --     GFR: Estimated Creatinine Clearance: 70.5 mL/min (by C-G formula based on SCr of 0.62 mg/dL).   Scheduled Meds:  amoxicillin-clavulanate  1 tablet Oral Q12H   bictegravir-emtricitabine-tenofovir AF  1 tablet Oral Daily   cyanocobalamin  1,000 mcg Oral Daily   enoxaparin (LOVENOX) injection  40 mg Subcutaneous Q24H   saccharomyces boulardii  250 mg Oral BID   sodium chloride flush  3 mL Intravenous Q12H     LOS: 4  days   Lonia Blood, MD [facility] Office  [phone] Pager - Text Page per Amion  If 7PM-7AM, please contact night-coverage per Amion 12/25/2022, 10:00 AM

## 2022-12-25 NOTE — Consult Note (Cosign Needed Addendum)
Beverly [name] 04-04-1981  [id number].    Requesting MD: Dr. Virgina Evener Chief Complaint/Reason for Consult: Skin bx  HPI: Beverly [name] is a 42 y.o. female with a hx of HIV/AIDS on Biktarvy who we were asked to see for cellulitis of the suprapubic region. ID has evaluated and feels the wound is worrisome for AFB or fungal involvement and has therefore requested General Surgery evaluation for biopsy and cultures. She is currently on Augmentin. The patient tells me that 2 years ago she had I&D of an abscess lateral to her left labia, since that time she has had a chronic wound there. She tells me that one year ago she developed a boil on her suprapubic region, it has gradually progressed with more painful lesions and skin breakdown in that area. She tells me that this area intermittently drains purulent material. Denies history of I&D in that area.   ROS: ROS As above, see hpi  Family History  Problem Relation Age of Onset   Throat cancer Mother        smoker   Throat cancer Father        smoker   Hypertension Father    Cirrhosis Father    Hypertension Other    Cancer Other        smoker   Diabetes Other    Asthma Other     Past Medical History:  Diagnosis Date   Abscess    AIDS (HCC) 03/19/2015   Anxiety    Asthma    Blood transfusion without reported diagnosis    Depression    HIV infection (HCC)    Homeless 03/19/2015   states stays with family or friends. Does not rent or own a home, but does not live on streets.   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   Skin ulcer (HCC) 05/06/2015    Past Surgical History:  Procedure Laterality Date   CERVICAL CONIZATION W/BX N/A 04/14/2018   Procedure: COLD KNIFE CONIZATION CERVIX WITH BIOPSY;  Surgeon: Shonna Chock, MD;  Location: [facility];  Service: Gynecology;  Laterality:  N/A;   CESAREAN SECTION     DILATION AND CURETTAGE OF UTERUS N/A 04/14/2018   Procedure: ENDOCERVICAL CURETTAGE;  Surgeon: Shonna Chock, MD;  Location: [facility];  Service: Gynecology;  Laterality: N/A;   INCISION AND DRAINAGE ABSCESS Right 01/10/2016   Procedure: INCISION AND DRAINAGE RIGHT BUTTOCK, LEFT LABIAL , EXCISION AND DRAINAGE OF  RIGHT AXILLARY ABSCESS;  Surgeon: Glenna Fellows, MD;  Location: [facility];  Service: General;  Laterality: Right;   TUBAL LIGATION  2005   VULVA Ples Specter BIOPSY N/A 04/14/2018   Procedure: VULVAR BIOPSIES;  Surgeon: Shonna Chock, MD;  Location: [facility];  Service: Gynecology;  Laterality: N/A;    Social History:  reports that she has been smoking cigarettes. She has a 0.30 pack-year smoking history. She has never used smokeless tobacco. She reports current alcohol use. She reports current drug use. Frequency: 2.00 times per week. Drugs: Marijuana and MDMA (Ecstacy).  Allergies:  Allergies  Allergen Reactions   Bee Venom Anaphylaxis, Shortness Of Breath and Swelling   Ferrlecit [Na Ferric Gluc Cplx In Sucrose] Swelling    Swelling and puffiness in hands and legs after Ferrlecit 250 mg infused over 60 minutes (possibly infusion related reaction)  Morphine And Codeine Hives and Itching    Just can't take "morphine", vicodin is OK    Medications Prior to Admission  Medication Sig Dispense Refill   [DISCONTINUED] Aspirin-Caffeine (BAYER BACK & BODY) 500-32.5 MG TABS Take 1 tablet by mouth as needed (pain).     azithromycin (ZITHROMAX) 600 MG tablet Take 2 tablets (1,200 mg total) by mouth once a week. (Patient not taking: Reported on 12/21/2022) 30 tablet 0   bictegravir-emtricitabine-tenofovir AF (BIKTARVY) 50-200-25 MG TABS tablet Take 1 tablet by mouth daily. Try to take at the same time each day with or without food. (Patient not taking: Reported on 12/23/2022) 30 tablet 11   hydrOXYzine (ATARAX) 25 MG tablet Take  1 tablet (25 mg total) by mouth 3 (three) times daily as needed. (Patient not taking: Reported on 12/23/2022) 60 tablet 0   pregabalin (LYRICA) 25 MG capsule Take 1 capsule (25 mg total) by mouth 2 (two) times daily. (Patient not taking: Reported on 10/19/2022) 60 capsule 0   sulfamethoxazole-trimethoprim (BACTRIM DS) 800-160 MG tablet Take 1 tablet by mouth daily. (Patient not taking: Reported on 12/21/2022) 30 tablet 11   vitamin B-12 1000 MCG tablet Take 1 tablet (1,000 mcg total) by mouth daily. (Patient not taking: Reported on 10/26/2021)       Physical Exam: Blood pressure (!) 126/91, pulse 91, temperature 98 F (36.7 C), temperature source Oral, resp. rate 15, height 5\' 1"  (1.549 m), weight 51.5 kg, SpO2 100 %. General: chronically ill appearing black female, NAD. HEENT: head -normocephalic, atraumatic; Eyes: PERRLA Neck- Trachea is midline CV- RRR, no m/r/g, no lower extremity edema  Chest wall - mutiple patchy areas of skin breakdown/chronic skin changes inframmary fold and upper abd.  Pulm- breathing is non-labored ORA Abd- soft, NT/ND, abdominal straie present  GU- there is 6x5 cm fluctuance over the SP region with no drainage, open wound in the skin fold as below with surrounding satellite lesions and some moisture-related skin breakdown. The left labia appears chronically indurated with skin breakdown laterally.   MSK- UE/LE symmetrical, no cyanosis, clubbing, or edema. Neuro- CN II-XII grossly in tact, no paresthesias. Psych- Alert and Oriented x3 with appropriate affect Skin: warm and dry, healing lesions of upper and lower extremities   Results for orders placed or performed during the hospital encounter of 12/20/22 (from the past 48 hour(s))  Magnesium     Status: None   Collection Time: 12/24/22 12:51 AM  Result Value Ref Range   Magnesium 1.8 1.7 - 2.4 mg/dL    Comment: Performed at [facility], [address], [place], [place] [address]  Basic metabolic panel      Status: Abnormal   Collection Time: 12/24/22 12:51 AM  Result Value Ref Range   Sodium 137 135 - 145 mmol/L   Potassium 4.1 3.5 - 5.1 mmol/L   Chloride 106 98 - 111 mmol/L   CO2 23 22 - 32 mmol/L   Glucose, Bld 87 70 - 99 mg/dL    Comment: Glucose reference range applies only to samples taken after fasting for at least 8 hours.   BUN 9 6 - 20 mg/dL   Creatinine, Ser 6.04 0.44 - 1.00 mg/dL   Calcium 8.0 (L) 8.9 - 10.3 mg/dL   GFR, Estimated >54 >09 mL/min    Comment: (NOTE) Calculated using the CKD-EPI Creatinine Equation (2021)    Anion gap 8 5 - 15    Comment: Performed at [facility], [address]  [place], [place] [address]  CBC     Status: Abnormal   Collection Time: 12/24/22 12:51 AM  Result Value Ref Range   WBC 2.7 (L) 4.0 - 10.5 K/uL   RBC 3.47 (L) 3.87 - 5.11 MIL/uL   Hemoglobin 9.1 (L) 12.0 - 15.0 g/dL   HCT 13.0 (L) 86.5 - 78.4 %   MCV 84.7 80.0 - 100.0 fL   MCH 26.2 26.0 - 34.0 pg   MCHC 31.0 30.0 - 36.0 g/dL   RDW 69.6 (H) 29.5 - 28.4 %   Platelets 97 (L) 150 - 400 K/uL    Comment: REPEATED TO VERIFY   nRBC 0.0 0.0 - 0.2 %    Comment: Performed at [facility], [address] [place], [place] [address]   No results found.  Anti-infectives (From admission, onward)    Start     Dose/Rate Route Frequency Ordered Stop   12/24/22 1100  amoxicillin-clavulanate (AUGMENTIN) 875-125 MG per tablet 1 tablet        1 tablet Oral Every 12 hours 12/24/22 1001     12/24/22 0000  amoxicillin-clavulanate (AUGMENTIN) 875-125 MG tablet        1 tablet Oral Every 12 hours 12/24/22 1241 12/31/22 2359   12/22/22 1900  Ampicillin-Sulbactam (UNASYN) 3 g in sodium chloride 0.9 % 100 mL IVPB  Status:  Discontinued        3 g 200 mL/hr over 30 Minutes Intravenous Every 6 hours 12/22/22 1753 12/24/22 1001   12/21/22 1115  bictegravir-emtricitabine-tenofovir AF (BIKTARVY) 50-200-25 MG per tablet 1 tablet       Note to Pharmacy: Try to take at the same time each day with  or without food.     1 tablet Oral Daily 12/21/22 1021     12/21/22 0045  piperacillin-tazobactam (ZOSYN) IVPB 3.375 g  Status:  Discontinued        3.375 g 12.5 mL/hr over 240 Minutes Intravenous Every 8 hours 12/21/22 0043 12/22/22 1749       Assessment/Plan Chronic skin changes of mons pubis and perineal region, abnormal skin lesion of suprapubic region, fluctuance of suprapubic region with clinical concern for abscess - after patient consent obtained I prepped the patients skin with cholraprep and aspirated the left suprapubic region with an 18G needle, twice, without return of purulence. No bleeding or complication. Patient was anxious so I gave her 0.5 mg IV ativan. Unfortunately no culture specimen obtained. - may benefit from skin sample taken from the abnormal lesion. Has appearance of a condyloma or hypergranular tissue? Will discuss with MD. Ordered lidocaine and silver nitrate in preparation for procedure. Patient was not ready to make a decision regarding consent for the procedure at the time of my exam.      I reviewed nursing notes, hospitalist notes, last 24 h vitals and pain scores, last 48 h intake and output, last 24 h labs and trends, and last 24 h imaging results  Hosie Spangle, [facility] Surgery 12/25/2022, 11:21 AM Please see Amion for pager number during day hours 7:00am-4:30pm

## 2022-12-25 NOTE — Plan of Care (Signed)
°  Problem: Education: °Goal: Knowledge of General Education information will improve °Description: Including pain rating scale, medication(s)/side effects and non-pharmacologic comfort measures °Outcome: Progressing °  °Problem: Health Behavior/Discharge Planning: °Goal: Ability to manage health-related needs will improve °Outcome: Progressing °  °Problem: Clinical Measurements: °Goal: Ability to maintain clinical measurements within normal limits will improve °Outcome: Progressing °Goal: Will remain free from infection °Outcome: Progressing °Goal: Diagnostic test results will improve °Outcome: Progressing °  °Problem: Activity: °Goal: Risk for activity intolerance will decrease °Outcome: Progressing °  °Problem: Nutrition: °Goal: Adequate nutrition will be maintained °Outcome: Progressing °  °Problem: Coping: °Goal: Level of anxiety will decrease °Outcome: Progressing °  °Problem: Pain Managment: °Goal: General experience of comfort will improve °Outcome: Progressing °  °Problem: Safety: °Goal: Ability to remain free from injury will improve °Outcome: Progressing °  °Problem: Skin Integrity: °Goal: Risk for impaired skin integrity will decrease °Outcome: Progressing °  °

## 2022-12-25 NOTE — Progress Notes (Signed)
Subjective: No new complaints   Antibiotics:  Anti-infectives (From admission, onward)    Start     Dose/Rate Route Frequency Ordered Stop   12/24/22 1100  amoxicillin-clavulanate (AUGMENTIN) 875-125 MG per tablet 1 tablet        1 tablet Oral Every 12 hours 12/24/22 1001     12/24/22 0000  amoxicillin-clavulanate (AUGMENTIN) 875-125 MG tablet        1 tablet Oral Every 12 hours 12/24/22 1241 12/31/22 2359   12/22/22 1900  Ampicillin-Sulbactam (UNASYN) 3 g in sodium chloride 0.9 % 100 mL IVPB  Status:  Discontinued        3 g 200 mL/hr over 30 Minutes Intravenous Every 6 hours 12/22/22 1753 12/24/22 1001   12/21/22 1115  bictegravir-emtricitabine-tenofovir AF (BIKTARVY) 50-200-25 MG per tablet 1 tablet       Note to Pharmacy: Try to take at the same time each day with or without food.     1 tablet Oral Daily 12/21/22 1021     12/21/22 0045  piperacillin-tazobactam (ZOSYN) IVPB 3.375 g  Status:  Discontinued        3.375 g 12.5 mL/hr over 240 Minutes Intravenous Every 8 hours 12/21/22 0043 12/22/22 1749       Medications: Scheduled Meds:  amoxicillin-clavulanate  1 tablet Oral Q12H   bictegravir-emtricitabine-tenofovir AF  1 tablet Oral Daily   cyanocobalamin  1,000 mcg Oral Daily   enoxaparin (LOVENOX) injection  40 mg Subcutaneous Q24H   saccharomyces boulardii  250 mg Oral BID   sodium chloride flush  3 mL Intravenous Q12H   Continuous Infusions: PRN Meds:.acetaminophen, ondansetron **OR** ondansetron (ZOFRAN) IV, oxyCODONE    Objective: Weight change:   Intake/Output Summary (Last 24 hours) at 12/25/2022 1325 Last data filed at 12/25/2022 0942 Gross per 24 hour  Intake 360 ml  Output --  Net 360 ml   Blood pressure (!) 126/91, pulse 91, temperature 98 F (36.7 C), temperature source Oral, resp. rate 15, height 5\' 1"  (1.549 m), weight 51.5 kg, SpO2 100 %. Temp:  [97.8 F (36.6 C)-98 F (36.7 C)] 98 F (36.7 C) (07/05 0859) Pulse Rate:  [91-99] 91  (07/05 0859) Resp:  [15-18] 15 (07/05 0859) BP: (118-126)/(77-91) 126/91 (07/05 0859) SpO2:  [99 %-100 %] 100 % (07/05 0859)  Physical Exam: Physical Exam Constitutional:      Appearance: She is well-developed.  HENT:     Head: Normocephalic and atraumatic.  Eyes:     Conjunctiva/sclera: Conjunctivae normal.  Cardiovascular:     Rate and Rhythm: Normal rate and regular rhythm.  Pulmonary:     Effort: Pulmonary effort is normal. No respiratory distress.     Breath sounds: No wheezing.  Abdominal:     General: There is no distension.     Palpations: Abdomen is soft.  Musculoskeletal:        General: No tenderness. Normal range of motion.     Cervical back: Normal range of motion and neck supple.  Skin:    General: Skin is warm and dry.     Coloration: Skin is not pale.     Findings: No erythema or rash.  Neurological:     Mental Status: She is alert and oriented to person, place, and time.  Psychiatric:        Mood and Affect: Mood normal.        Behavior: Behavior normal.        Thought Content: Thought content normal.  Judgment: Judgment normal.     Wound 12/25/2022:        CBC:    BMET Recent Labs    12/23/22 0218 12/24/22 0051  NA 138 137  K 4.1 4.1  CL 105 106  CO2 28 23  GLUCOSE 95 87  BUN 12 9  CREATININE 0.77 0.62  CALCIUM 7.9* 8.0*     Liver Panel  Recent Labs    12/23/22 0218  PROT 7.6  ALBUMIN 2.4*  AST 29  ALT 11  ALKPHOS 64  BILITOT 0.7       Sedimentation Rate No results for input(s): "ESRSEDRATE" in the last 72 hours. C-Reactive Protein No results for input(s): "CRP" in the last 72 hours.  Micro Results: Recent Results (from the past 720 hour(s))  Culture, blood (routine x 2)     Status: None (Preliminary result)   Collection Time: 12/21/22  1:25 AM   Specimen: BLOOD  Result Value Ref Range Status   Specimen Description   Final    BLOOD LEFT ANTECUBITAL Performed at [facility], [address] [place], [place] [address]    Special Requests   Final    BOTTLES DRAWN AEROBIC AND ANAEROBIC Blood Culture adequate volume Performed at [facility], [address] [place], [place] [address]    Culture   Final    NO GROWTH 4 DAYS Performed at [facility], [address], [place], [place] [address]    Report Status PENDING  Incomplete  Culture, blood (routine x 2)     Status: None (Preliminary result)   Collection Time: 12/21/22  1:30 AM   Specimen: BLOOD  Result Value Ref Range Status   Specimen Description   Final    BLOOD RIGHT ANTECUBITAL Performed at [facility], [address] [place], [place] [address]    Special Requests   Final    BOTTLES DRAWN AEROBIC AND ANAEROBIC Blood Culture results may not be optimal due to an inadequate volume of blood received in culture bottles Performed at [facility], [address] [place], [place] [address]    Culture   Final    NO GROWTH 4 DAYS Performed at [facility], [address] [place], [place] [address]    Report Status PENDING  Incomplete    Studies/Results: No results found.    Assessment/Plan:  INTERVAL HISTORY: purulence improving   Principal Problem:   Cellulitis Active Problems:   AIDS (acquired immune deficiency syndrome) (HCC)   Anxiety and depression   Hypokalemia   Pancytopenia (HCC)   Hypomagnesemia   Hypocalcemia   Kaposi sarcoma (HCC)   Nephrolithiasis    Beverly [name] is a 42 y.o. female with HIV/AIDS and admission for purulent wound  #1 while this wound is indeed purulence in this part is improving I find the appearance highly unusual.  The patient is also supremely immunocompromised with a nonexistent CD4 count when last measured.  I think it would be prudent to ask for a biopsy of the wound with specimen sent in sterile saline for AFB cultures and fungal cultures in separate specimen sent for pathology which can be sent in formalin for  that specimen.  We can certainly follow-up these results as an outpatient and react to them and she can continue on her Augmentin as planned.  HIV AIDS: Really terribly compliant recently though she used to be adherent many years ago continue he  Biktarvy and she needs PCP prophylaxis  I not see the viral load or CD4 were ordered during this admission so I will order both.   I have personally spent 52 minutes involved in face-to-face and non-face-to-face activities for this patient on the day of the visit. Professional time spent includes the following activities: Preparing to see the patient (review of tests), Obtaining and/or reviewing separately obtained history (admission/discharge record), Performing a medically appropriate examination and/or evaluation , Ordering medications/tests/procedures, referring and communicating with other health care professionals, Documenting clinical information in the EMR, Independently interpreting results (not separately reported), Communicating results to the patient/family/caregiver, Counseling and educating the patient/family/caregiver and Care coordination (not separately reported).      LOS: 4 days   Acey Lav 12/25/2022, 1:25 PM

## 2022-12-26 LAB — CULTURE, BLOOD (ROUTINE X 2)
Culture: NO GROWTH
Culture: NO GROWTH
Special Requests: ADEQUATE

## 2022-12-26 LAB — HIV-1 RNA QUANT-NO REFLEX-BLD
HIV 1 RNA Quant: 24400 copies/mL
LOG10 HIV-1 RNA: 4.387 log10copy/mL

## 2022-12-26 MED ORDER — MIRTAZAPINE 15 MG PO TABS
7.5000 mg | ORAL_TABLET | Freq: Every day | ORAL | Status: DC
  Administered 2022-12-26 – 2022-12-27 (×2): 7.5 mg via ORAL
  Filled 2022-12-26 (×2): qty 1

## 2022-12-26 NOTE — Plan of Care (Signed)

## 2022-12-26 NOTE — Progress Notes (Signed)
Beverly [name]  VWU:[id number] DOB: Apr 14, 1981 DOA: 12/20/2022 PCP: Pcp, No    Brief Narrative:  42 year old with a history of HIV/AIDS, Kaposi's sarcoma, peripheral neuropathy, nephrolithiasis, and anxiety/depression who presented to the ER 12/21/2022 with lower abdominal pain associated with 2 weeks of a rash which has begun to drain yellow-green fluid and was associated with a fever up to 102 at home.  Goals of Care:  Code Status: Full Code   DVT prophylaxis: Lovenox  Interim Hx: Attempts were made yesterday by General Surgery to aspirate an apparent fluctuant area of the mons pubis but no fluid was able to be produced.  Plans are being made to biopsy the patient's suprapubic wound.  She has no new complaints today but does admit to very poor appetite and significant weight loss over the past few months.  Assessment & Plan:  Cellulitis of suprapubic region - unusual wound At presentation exam revealed a wound of the low abdomen/suprapubic region with induration and drainage - CT pelvis revealed soft tissue wound with no abscess present - ID following and feels the wound is worrisome for AFB or fungal involvement and has therefore requested General Surgery evaluation for biopsy and cultures - transitioned to oral Augmentin -plans underway to arrange for biopsy   Pancytopenia Acute on chronic -appears stable at present -felt to be related to uncontrolled HIV  Electrolyte abnormalities -hypokalemia, hypomagnesemia Potassium has stabilized - magnesium has stabilized with supplementation  HIV/AIDS - Kaposi's sarcoma Initially diagnosed 2005 -intermittent adherence to Biktarvy therapy -care per ID with patient encouraged to strictly comply with medical therapy  Anxiety/depression Continue Vistaril as needed  Chronic nephrolithiasis No evidence of obstruction at present  Family Communication: No family present at time of exam Disposition:    Objective: Blood pressure 114/86, pulse  88, temperature 98.2 F (36.8 C), temperature source Oral, resp. rate 16, height 5\' 1"  (1.549 m), weight 51.5 kg, SpO2 100 %. No intake or output data in the 24 hours ending 12/26/22 0942  Filed Weights   12/20/22 1912 12/21/22 0641  Weight: 54.4 kg 51.5 kg    Examination: General: No acute respiratory distress Lungs: Clear to auscultation bilaterally  Cardiovascular: RRR Extremities: No significant edema bilateral lower extremities  CBC: Recent Labs  Lab 12/20/22 1919 12/22/22 0036 12/23/22 0218 12/24/22 0051  WBC 2.6* 2.5* 2.2* 2.7*  NEUTROABS 1.3*  --  1.4*  --   HGB 10.2* 9.1* 9.6* 9.1*  HCT 33.4* 29.9* 30.9* 29.4*  MCV 86.5 88.2 84.9 84.7  PLT 134* 96* 113* 97*    Basic Metabolic Panel: Recent Labs  Lab 12/22/22 0036 12/23/22 0218 12/24/22 0051  NA 135 138 137  K 3.8 4.1 4.1  CL 105 105 106  CO2 23 28 23   GLUCOSE 104* 95 87  BUN 10 12 9   CREATININE 0.87 0.77 0.62  CALCIUM 7.9* 7.9* 8.0*  MG 1.9 1.6* 1.8  PHOS  --  3.6  --     GFR: Estimated Creatinine Clearance: 70.5 mL/min (by C-G formula based on SCr of 0.62 mg/dL).   Scheduled Meds:  amoxicillin-clavulanate  1 tablet Oral Q12H   bictegravir-emtricitabine-tenofovir AF  1 tablet Oral Daily   cyanocobalamin  1,000 mcg Oral Daily   enoxaparin (LOVENOX) injection  40 mg Subcutaneous Q24H   lidocaine HCl (PF)  10 mL Intradermal Once   saccharomyces boulardii  250 mg Oral BID   silver nitrate applicators  10 Stick Topical Once   sodium chloride flush  3 mL Intravenous  Q12H     LOS: 5 days   Lonia Blood, MD [facility] Office  [phone] Pager - Text Page per Amion  If 7PM-7AM, please contact night-coverage per Amion 12/26/2022, 9:42 AM

## 2022-12-26 NOTE — Progress Notes (Signed)
Subjective:  No new complaints Antibiotics:  Anti-infectives (From admission, onward)    Start     Dose/Rate Route Frequency Ordered Stop   12/24/22 1100  amoxicillin-clavulanate (AUGMENTIN) 875-125 MG per tablet 1 tablet        1 tablet Oral Every 12 hours 12/24/22 1001     12/24/22 0000  amoxicillin-clavulanate (AUGMENTIN) 875-125 MG tablet        1 tablet Oral Every 12 hours 12/24/22 1241 12/31/22 2359   12/22/22 1900  Ampicillin-Sulbactam (UNASYN) 3 g in sodium chloride 0.9 % 100 mL IVPB  Status:  Discontinued        3 g 200 mL/hr over 30 Minutes Intravenous Every 6 hours 12/22/22 1753 12/24/22 1001   12/21/22 1115  bictegravir-emtricitabine-tenofovir AF (BIKTARVY) 50-200-25 MG per tablet 1 tablet       Note to Pharmacy: Try to take at the same time each day with or without food.     1 tablet Oral Daily 12/21/22 1021     12/21/22 0045  piperacillin-tazobactam (ZOSYN) IVPB 3.375 g  Status:  Discontinued        3.375 g 12.5 mL/hr over 240 Minutes Intravenous Every 8 hours 12/21/22 0043 12/22/22 1749       Medications: Scheduled Meds:  amoxicillin-clavulanate  1 tablet Oral Q12H   bictegravir-emtricitabine-tenofovir AF  1 tablet Oral Daily   cyanocobalamin  1,000 mcg Oral Daily   enoxaparin (LOVENOX) injection  40 mg Subcutaneous Q24H   lidocaine HCl (PF)  10 mL Intradermal Once   mirtazapine  7.5 mg Oral QHS   saccharomyces boulardii  250 mg Oral BID   silver nitrate applicators  10 Stick Topical Once   sodium chloride flush  3 mL Intravenous Q12H   Continuous Infusions: PRN Meds:.acetaminophen, ondansetron **OR** ondansetron (ZOFRAN) IV, oxyCODONE    Objective: Weight change:  No intake or output data in the 24 hours ending 12/26/22 1352  Blood pressure 114/86, pulse 88, temperature 98.2 F (36.8 C), temperature source Oral, resp. rate 16, height 5\' 1"  (1.549 m), weight 51.5 kg, SpO2 100 %. Temp:  [98.1 F (36.7 C)-98.2 F (36.8 C)] 98.2 F (36.8 C)  (07/06 0737) Pulse Rate:  [88] 88 (07/06 0533) Resp:  [16] 16 (07/06 0737) BP: (114-118)/(86-88) 114/86 (07/06 0737) SpO2:  [100 %] 100 % (07/06 0737)  Physical Exam: Physical Exam Constitutional:      General: She is not in acute distress.    Appearance: She is well-developed. She is not diaphoretic.  HENT:     Head: Normocephalic and atraumatic.     Right Ear: External ear normal.     Left Ear: External ear normal.     Mouth/Throat:     Pharynx: No oropharyngeal exudate.  Eyes:     General: No scleral icterus.    Conjunctiva/sclera: Conjunctivae normal.     Pupils: Pupils are equal, round, and reactive to light.  Cardiovascular:     Rate and Rhythm: Normal rate and regular rhythm.  Pulmonary:     Effort: Pulmonary effort is normal. No respiratory distress.     Breath sounds: Normal breath sounds. No wheezing.  Abdominal:     General: There is no distension.     Palpations: Abdomen is soft.  Musculoskeletal:        General: No tenderness. Normal range of motion.  Lymphadenopathy:     Cervical: No cervical adenopathy.  Skin:    General: Skin is warm and dry.  Coloration: Skin is not pale.     Findings: No erythema or rash.  Neurological:     General: No focal deficit present.     Mental Status: She is alert and oriented to person, place, and time.     Motor: No abnormal muscle tone.     Coordination: Coordination normal.  Psychiatric:        Mood and Affect: Mood normal.        Behavior: Behavior normal.        Thought Content: Thought content normal.        Judgment: Judgment normal.     Wound 12/25/2022:        CBC:    BMET Recent Labs    12/24/22 0051  NA 137  K 4.1  CL 106  CO2 23  GLUCOSE 87  BUN 9  CREATININE 0.62  CALCIUM 8.0*      Liver Panel  No results for input(s): "PROT", "ALBUMIN", "AST", "ALT", "ALKPHOS", "BILITOT", "BILIDIR", "IBILI" in the last 72 hours.      Sedimentation Rate No results for input(s): "ESRSEDRATE"  in the last 72 hours. C-Reactive Protein No results for input(s): "CRP" in the last 72 hours.  Micro Results: Recent Results (from the past 720 hour(s))  Culture, blood (routine x 2)     Status: None   Collection Time: 12/21/22  1:25 AM   Specimen: BLOOD  Result Value Ref Range Status   Specimen Description   Final    BLOOD LEFT ANTECUBITAL Performed at [facility], [address] [place], [place] [address]    Special Requests   Final    BOTTLES DRAWN AEROBIC AND ANAEROBIC Blood Culture adequate volume Performed at [facility], [address] [place], [place] [address]    Culture   Final    NO GROWTH 5 DAYS Performed at [facility], [address], [place], [place] [address]    Report Status 12/26/2022 FINAL  Final  Culture, blood (routine x 2)     Status: None   Collection Time: 12/21/22  1:30 AM   Specimen: BLOOD  Result Value Ref Range Status   Specimen Description   Final    BLOOD RIGHT ANTECUBITAL Performed at [facility], [address] [place], [place] [address]    Special Requests   Final    BOTTLES DRAWN AEROBIC AND ANAEROBIC Blood Culture results may not be optimal due to an inadequate volume of blood received in culture bottles Performed at [facility], [address] [place], [place] [address]    Culture   Final    NO GROWTH 5 DAYS Performed at [facility], [address], [place], [place] [address]    Report Status 12/26/2022 FINAL  Final    Studies/Results: No results found.    Assessment/Plan:  INTERVAL HISTORY:   Surgery seen the patient and attempted to aspirate the area in the suprapubic region concern for abscess but did not return any purulence.  The patient was not ready for biopsy yesterday when it was offered   Principal Problem:   Cellulitis Active Problems:   AIDS (acquired immune deficiency syndrome) (HCC)   Anxiety and depression   Hypokalemia   Pancytopenia  (HCC)   Hypomagnesemia   Hypocalcemia   Kaposi sarcoma (HCC)   Nephrolithiasis    Beverly [name] is a 42 y.o. female with HIV/AIDS and admission for purulent wound  #  1 while this wound is indeed purulence in this part is improving I find the appearance highly unusual.   The patient is also supremely immunocompromised with a nonexistent CD4 count when last measured.  I think it would be prudent to ask for a biopsy of the wound with specimen sent in sterile saline for AFB cultures and fungal cultures in separate specimen sent for pathology which can be sent in formalin for that specimen.  We can certainly follow-up these results as an outpatient and react to them and she can continue on her Augmentin as planned.  Greatly General surgery having attempted to aspirate purulent material and also offering the patient biopsy.  I encouraged her to proceed with biopsy which again we can follow-up as an outpatient  HIV AIDS: Really terribly compliant recently though she used to be adherent many years ago continue he Biktarvy and she needs PCP prophylaxis  Reorder viral load and CD4 count  Kaposi;s sarcoma on LE  I have personally spent 36 minutes involved in face-to-face and non-face-to-face activities for this patient on the day of the visit. Professional time spent includes the following activities: Preparing to see the patient (review of tests), Obtaining and/or reviewing separately obtained history (admission/discharge record), Performing a medically appropriate examination and/or evaluation , Ordering medications/tests/procedures, referring and communicating with other health care professionals, Documenting clinical information in the EMR, Independently interpreting results (not separately reported), Communicating results to the patient/family/caregiver, Counseling and educating the patient/family/caregiver and Care coordination (not separately reported).    LOS: 5 days   Acey Lav 12/26/2022, 1:52 PM

## 2022-12-27 NOTE — Progress Notes (Signed)
Beverly [name]  NFA:[id number] DOB: 1980/09/09 DOA: 12/20/2022 PCP: Pcp, No    Brief Narrative:  42 year old with a history of HIV/AIDS, Kaposi's sarcoma, peripheral neuropathy, nephrolithiasis, and anxiety/depression who presented to the ER 12/21/2022 with lower abdominal pain associated with 2 weeks of a rash which has begun to drain yellow-green fluid and was associated with a fever up to 102 at home.  Goals of Care:  Code Status: Full Code   DVT prophylaxis: Lovenox  Interim Hx: No acute events recorded overnight.  We are awaiting a biopsy of the patient's suprapubic wound prior to discharge.  Afebrile.  Vital signs stable.  Assessment & Plan:  Cellulitis of suprapubic region - unusual wound At presentation exam revealed a wound of the low abdomen/suprapubic region with induration and drainage - CT pelvis revealed soft tissue wound with no abscess present - ID following and feels the wound is worrisome for AFB or fungal involvement and has therefore requested General Surgery evaluation for biopsy and cultures - transitioned to oral Augmentin - plans underway to arrange for biopsy -attempts were made to aspirate a fluctuant area of the mons pubis but no purulent fluid was discovered  Pancytopenia Acute on chronic -appears stable at present -felt to be related to uncontrolled HIV  Electrolyte abnormalities - hypokalemia, hypomagnesemia Potassium has stabilized - magnesium has stabilized with supplementation  HIV/AIDS - Kaposi's sarcoma Initially diagnosed 2005 - intermittent adherence to Biktarvy therapy - care per ID with patient encouraged to strictly comply with medical therapy  Anxiety/depression Continue Vistaril as needed  Chronic nephrolithiasis No evidence of obstruction at present  Family Communication: No family present at time of exam Disposition:    Objective: Blood pressure 114/81, pulse 95, temperature 98 F (36.7 C), resp. rate 18, height 5\' 1"  (1.549 m),  weight 51.5 kg, SpO2 100 %. No intake or output data in the 24 hours ending 12/27/22 0911  Filed Weights   12/20/22 1912 12/21/22 0641  Weight: 54.4 kg 51.5 kg    Examination: General: No acute respiratory distress Lungs: Clear to auscultation bilaterally  Cardiovascular: RRR Extremities: No significant edema B LE  CBC: Recent Labs  Lab 12/20/22 1919 12/22/22 0036 12/23/22 0218 12/24/22 0051  WBC 2.6* 2.5* 2.2* 2.7*  NEUTROABS 1.3*  --  1.4*  --   HGB 10.2* 9.1* 9.6* 9.1*  HCT 33.4* 29.9* 30.9* 29.4*  MCV 86.5 88.2 84.9 84.7  PLT 134* 96* 113* 97*    Basic Metabolic Panel: Recent Labs  Lab 12/22/22 0036 12/23/22 0218 12/24/22 0051  NA 135 138 137  K 3.8 4.1 4.1  CL 105 105 106  CO2 23 28 23   GLUCOSE 104* 95 87  BUN 10 12 9   CREATININE 0.87 0.77 0.62  CALCIUM 7.9* 7.9* 8.0*  MG 1.9 1.6* 1.8  PHOS  --  3.6  --     GFR: Estimated Creatinine Clearance: 70.5 mL/min (by C-G formula based on SCr of 0.62 mg/dL).   Scheduled Meds:  amoxicillin-clavulanate  1 tablet Oral Q12H   bictegravir-emtricitabine-tenofovir AF  1 tablet Oral Daily   cyanocobalamin  1,000 mcg Oral Daily   enoxaparin (LOVENOX) injection  40 mg Subcutaneous Q24H   mirtazapine  7.5 mg Oral QHS   saccharomyces boulardii  250 mg Oral BID   sodium chloride flush  3 mL Intravenous Q12H     LOS: 6 days   Lonia Blood, MD [facility] Office  [phone] Pager - Text Page per Loretha Stapler  If 7PM-7AM, please  contact night-coverage per Amion 12/27/2022, 9:11 AM

## 2022-12-27 NOTE — Plan of Care (Signed)

## 2022-12-28 ENCOUNTER — Other Ambulatory Visit (HOSPITAL_COMMUNITY): Payer: Self-pay

## 2022-12-28 MED ORDER — MIRTAZAPINE 7.5 MG PO TABS
7.5000 mg | ORAL_TABLET | Freq: Every day | ORAL | 2 refills | Status: DC
  Filled 2022-12-28: qty 30, 30d supply, fill #0

## 2022-12-28 MED ORDER — BICTEGRAVIR-EMTRICITAB-TENOFOV 50-200-25 MG PO TABS
1.0000 | ORAL_TABLET | Freq: Every day | ORAL | 2 refills | Status: DC
Start: 2022-12-28 — End: 2023-02-16
  Filled 2022-12-28: qty 30, 30d supply, fill #0

## 2022-12-28 MED ORDER — AMOXICILLIN-POT CLAVULANATE 875-125 MG PO TABS
1.0000 | ORAL_TABLET | Freq: Two times a day (BID) | ORAL | 0 refills | Status: AC
  Filled 2022-12-28: qty 10, 5d supply, fill #0

## 2022-12-28 NOTE — Plan of Care (Signed)
  Problem: Education: Goal: Knowledge of General Education information will improve Description: Including pain rating scale, medication(s)/side effects and non-pharmacologic comfort measures Outcome: Progressing   Problem: Clinical Measurements: Goal: Will remain free from infection Outcome: Progressing   Problem: Clinical Measurements: Goal: Diagnostic test results will improve Outcome: Progressing   Problem: Pain Managment: Goal: General experience of comfort will improve Outcome: Progressing   Problem: Safety: Goal: Ability to remain free from injury will improve Outcome: Progressing

## 2022-12-28 NOTE — Plan of Care (Signed)

## 2022-12-28 NOTE — Progress Notes (Signed)
Explained discharge instructions to patient. Reviewed follow up appointment and next medication administration times. Also reviewed education. Patient verbalized having an understanding for instructions given. All belongings are in the patient's possession. IV was removed by floor staff prior to explaining the discharge. No other needs verbalized. Spoke with [facility] pharmacy who advised they are awaiting approval for Biktarvy. Patient requested to wait for med approval in the discharge lounge. Transported downstairs to the discharge lounge to await TOC meds and ride.

## 2022-12-28 NOTE — Progress Notes (Signed)
Patient refused Vashe, and silver hydrofiber to cover abdominal wound. Patient only allowed an ABD pad to be placed after wound was cleansed. Patient reports that the Vashe cleanser caused her wound to become painful and inflamed.

## 2022-12-28 NOTE — Discharge Summary (Signed)
DISCHARGE SUMMARY  Beverly [name]  MR#: [id number]  DOB:[birth year]  Date of Admission: 12/20/2022 Date of Discharge: 12/28/2022  Attending Physician:Jontrell Bushong Silvestre Gunner, MD  [name] PCP:Pcp, No  Consults: ID General Surgery  Disposition: Discharge home  Follow-up Appts:  Follow-up Information     Your Primary Care Provider Follow up.   Why: Make an appointment with your Primary Care Provider in 5-7 days to have your wound checked. Make sure you are taking all of your medications exactly as prescribed.        [facility] Follow up on 12/31/2022.   Why: 3:30 pm with Dr. Katheran James, post hospital followup and to establish primary care Contact information: [address] [place] [address] [phone]                Tests Needing Follow-up: -Assure outpatient dermatology evaluation is completed for recommended biopsy of suprapubic wound -question patient on compliance w/ HIV meds  -assure patient is following up with her ID Physician regularly   Discharge Diagnoses: Cellulitis of suprapubic region - unusual wound Pancytopenia Electrolyte abnormalities - hypokalemia, hypomagnesemia HIV/AIDS - Kaposi's sarcoma Anxiety/depression Chronic nephrolithiasis  Initial presentation: 42 year old with a history of HIV/AIDS, Kaposi's sarcoma, peripheral neuropathy, nephrolithiasis, and anxiety/depression who presented to the ER 12/21/2022 with lower abdominal pain associated with 2 weeks of a rash which has begun to drain yellow-green fluid and was associated with a fever up to 102 at home.   Hospital Course:  Cellulitis of suprapubic region - unusual wound At presentation exam revealed a wound of the low abdomen/suprapubic region with induration and drainage - CT pelvis revealed soft tissue wound with no abscess present - ID evaluated and felt the wound was unusual in appearance and worrisome for AFB or fungal involvement  - a General Surgery evaluation for biopsy and cultures was requested and attempts were made to aspirate a fluctuant area of the mons pubis but no purulent fluid was discovered -the patient is discharged was delayed in hopes of facilitating an inpatient biopsy of her suprapubic wound but on 12/28/2022 the surgical team alerted the medical team that they would not be able to accomplish this as an inpatient and recommended an outpatient dermatology referral -the patient was otherwise stable for discharge - she is to continue oral antibiotic at time of discharge with medication being provided from the [facility] at the time of her discharge   Pancytopenia Acute on chronic - appears stable at present - felt to be related to uncontrolled HIV   Electrolyte abnormalities - hypokalemia, hypomagnesemia Potassium has stabilized - magnesium has stabilized with supplementation   HIV/AIDS - Kaposi's sarcoma Initially diagnosed 2005 - intermittent adherence to Biktarvy therapy - care per ID with patient encouraged to strictly comply with medical therapy   Anxiety/depression Continue Vistaril as needed   Chronic nephrolithiasis No evidence of obstruction at present    Allergies as of 12/28/2022       Reactions   Bee Venom Anaphylaxis, Shortness Of Breath, Swelling   Ferrlecit [na Ferric Gluc Cplx In Sucrose] Swelling   Swelling and puffiness in hands and legs after Ferrlecit 250 mg infused over 60 minutes (possibly infusion related reaction)   Morphine And Codeine Hives, Itching   Just can't take "morphine", vicodin is OK        Medication List     STOP taking these medications    [employer] 500-32.5 MG Tabs Generic drug: Aspirin-Caffeine  TAKE these medications    acetaminophen 325 MG tablet Commonly known as: TYLENOL Take 2 tablets (650 mg total) by mouth every 6 (six) hours as needed for mild pain, fever or headache.   amoxicillin-clavulanate 875-125 MG  tablet Commonly known as: AUGMENTIN Take 1 tablet by mouth every 12 (twelve) hours for 5 days.   azithromycin 600 MG tablet Commonly known as: ZITHROMAX Take 2 tablets (1,200 mg total) by mouth once a week.   bictegravir-emtricitabine-tenofovir AF 50-200-25 MG Tabs tablet Commonly known as: BIKTARVY Take 1 tablet by mouth daily. Try to take at the same time each day with or without food.   cyanocobalamin 1000 MCG tablet Take 1 tablet (1,000 mcg total) by mouth daily.   hydrOXYzine 25 MG tablet Commonly known as: ATARAX Take 1 tablet (25 mg total) by mouth 3 (three) times daily as needed.   mirtazapine 7.5 MG tablet Commonly known as: REMERON Take 1 tablet (7.5 mg total) by mouth at bedtime.   oxyCODONE 5 MG immediate release tablet Commonly known as: Oxy IR/ROXICODONE Take 1 tablet (5 mg total) by mouth every 4 (four) hours as needed for moderate pain.   pregabalin 25 MG capsule Commonly known as: Lyrica Take 1 capsule (25 mg total) by mouth 2 (two) times daily.   sulfamethoxazole-trimethoprim 800-160 MG tablet Commonly known as: BACTRIM DS Take 1 tablet by mouth daily.        Day of Discharge BP 129/80 (BP Location: Left Arm)   Pulse 94   Temp 98.3 F (36.8 C) (Oral)   Resp 18   Ht 5\' 1"  (1.549 m)   Wt 51.5 kg   SpO2 100%   BMI 21.45 kg/m    Basic Metabolic Panel: Recent Labs  Lab 12/22/22 0036 12/23/22 0218 12/24/22 0051  NA 135 138 137  K 3.8 4.1 4.1  CL 105 105 106  CO2 23 28 23   GLUCOSE 104* 95 87  BUN 10 12 9   CREATININE 0.87 0.77 0.62  CALCIUM 7.9* 7.9* 8.0*  MG 1.9 1.6* 1.8  PHOS  --  3.6  --     CBC: Recent Labs  Lab 12/22/22 0036 12/23/22 0218 12/24/22 0051  WBC 2.5* 2.2* 2.7*  NEUTROABS  --  1.4*  --   HGB 9.1* 9.6* 9.1*  HCT 29.9* 30.9* 29.4*  MCV 88.2 84.9 84.7  PLT 96* 113* 97*    Time spent in discharge (includes decision making & examination of pt): 35 minutes  12/28/2022, 9:17 AM   Lonia Blood, MD [facility] Office  [phone]

## 2022-12-29 LAB — CD4/CD8 (T-HELPER/T-SUPPRESSOR CELL)
CD4 absolute: <35 /uL — ABNORMAL LOW (ref 400–1790)
CD4%: 1.45 % — ABNORMAL LOW (ref 33–65)
CD8 T Cell Abs: 336 /uL (ref 190–1000)
CD8tox: 57.61 % — ABNORMAL HIGH (ref 12–40)
Ratio: 0.03 — ABNORMAL LOW (ref 1.0–3.0)
Total lymphocyte count: 584 /uL — ABNORMAL LOW (ref 1000–4000)

## 2022-12-31 ENCOUNTER — Ambulatory Visit (INDEPENDENT_AMBULATORY_CARE_PROVIDER_SITE_OTHER): Payer: Self-pay | Admitting: Student

## 2022-12-31 VITALS — BP 122/81 | HR 108 | Temp 97.8°F | Ht 61.0 in | Wt 115.8 lb

## 2022-12-31 DIAGNOSIS — L03311 Cellulitis of abdominal wall: Secondary | ICD-10-CM

## 2022-12-31 DIAGNOSIS — Z59819 Housing instability, housed unspecified: Secondary | ICD-10-CM

## 2022-12-31 DIAGNOSIS — L989 Disorder of the skin and subcutaneous tissue, unspecified: Secondary | ICD-10-CM

## 2022-12-31 DIAGNOSIS — F419 Anxiety disorder, unspecified: Secondary | ICD-10-CM

## 2022-12-31 DIAGNOSIS — B20 Human immunodeficiency virus [HIV] disease: Secondary | ICD-10-CM

## 2022-12-31 DIAGNOSIS — Z5982 Transportation insecurity: Secondary | ICD-10-CM

## 2022-12-31 DIAGNOSIS — F411 Generalized anxiety disorder: Secondary | ICD-10-CM

## 2022-12-31 DIAGNOSIS — F32A Depression, unspecified: Secondary | ICD-10-CM

## 2022-12-31 MED ORDER — ESCITALOPRAM OXALATE 5 MG PO TABS: 5.0000 mg | ORAL_TABLET | Freq: Every day | ORAL | 11 refills | Status: DC

## 2022-12-31 NOTE — Assessment & Plan Note (Signed)
HIV infection with AIDS and Kaposi Sarcoma prescribed Biktaravy and Augmentin. She is leukopenic. Unfortunately, and likely influenced by her difficult social situation, Beverly [name] has been irregular with Biktarvy treatment. As a result, her infection is not well controlled. She does continue to follow with ID and has an appointment upcoming on 7/15.

## 2022-12-31 NOTE — Assessment & Plan Note (Addendum)
Suprapubic wound present for over a year that has progressively worsened. She was recently discharged from the hospital after a one-week stay for this issue. She was hospitalized because the wound began to drain yellow-green fluid and was associated with a fever up to 102 at home. CT pelvis revealed soft tissue wound with no abscess present. Attempts were made to procure a biopsy/culture of the wound but unfortunately were not successful. She has completed a course of azithromycin and bactrim and feels that the wound has healed somewhat.  She has wounds present in the suprapubic and vulvar region. Suprapubic wound present approximately 2 inches across with exposed dermis. Some areas missing granulation tissue but in general the wound is dry and non-inflamed without bleeding or fluid drainage. Vulvar exam deferred. She is attempting to follow-up with dermatology but has not connected yet, will see if we can help with that. PLAN: Continue good wound hygiene and follow up with dermatology.

## 2022-12-31 NOTE — Assessment & Plan Note (Deleted)
She was recently discharged from the hospital after a one-week stay for this issue. She has completed a course of azithromycin and bactrim and feels that the wound has healed somewhat.  She has wounds present in the suprapubic and vulvar region. Suprapubic wound present approximately 2 inches across with exposed dermis. Some areas missing granulation tissue but the area is dry and non-inflamed as a whole and is not draining.  PLAN: Continue good wound hygiene and follow up with dermatology.

## 2022-12-31 NOTE — Patient Instructions (Signed)
[name],  Thank you for coming in today. This after visit summary is an important review of tests, referrals, and medication changes that were discussed during your visit. If you have questions or concerns, call the clinic at [phone] between 9am - 4pm. Outside of clinic business hours, call the main hospital at [phone] and ask the operator for the on-call internal medicine resident.   Best, Dr. Katheran James

## 2022-12-31 NOTE — Progress Notes (Signed)
CC: HFU and Establish Care  HPI:  Beverly [name] is a 42 y.o. female living with a history stated below and presents today for HFU and Establish Care. Please see problem based assessment and plan for additional details.  Past Medical History:  Diagnosis Date   Abscess    AIDS (HCC) 03/19/2015   Anxiety    Asthma    Blood transfusion without reported diagnosis    Depression    HIV infection (HCC)    Homeless 03/19/2015   states stays with family or friends. Does not rent or own a home, but does not live on streets.   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     amoxicillin-clavulanate (AUGMENTIN) 875-125 MG tablet Take 1 tablet by mouth every 12 (twelve) hours for 5 days. 10 tablet 0   azithromycin (ZITHROMAX) 600 MG tablet Take 2 tablets (1,200 mg total) by mouth once a week. (Patient not taking: Reported on 12/21/2022) 30 tablet 0   bictegravir-emtricitabine-tenofovir AF (BIKTARVY) 50-200-25 MG TABS tablet Take 1 tablet by mouth daily. Try to take at the same time each day with or without food. 30 tablet 2   mirtazapine (REMERON) 7.5 MG tablet Take 1 tablet (7.5 mg total) by mouth at bedtime. 30 tablet 2   oxyCODONE (OXY IR/ROXICODONE) 5 MG immediate release tablet Take 1 tablet (5 mg total) by mouth every 4 (four) hours as needed for moderate pain. 20 tablet 0   pregabalin (LYRICA) 25 MG capsule Take 1 capsule (25 mg total) by mouth 2 (two) times daily. (Patient not taking: Reported on 10/19/2022) 60 capsule 0   sulfamethoxazole-trimethoprim (BACTRIM DS) 800-160 MG tablet Take 1 tablet by mouth daily.  (Patient not taking: Reported on 12/21/2022) 30 tablet 11   vitamin B-12 1000 MCG tablet Take 1 tablet (1,000 mcg total) by mouth daily. (Patient not taking: Reported on 10/26/2021)     No current facility-administered medications on file prior to visit.    Family History  Problem Relation Age of Onset   Throat cancer Mother        smoker   Throat cancer Father        smoker   Hypertension Father    Cirrhosis Father    Hypertension Other    Cancer Other        smoker   Diabetes Other    Asthma Other     Social History   Socioeconomic History   Marital status: Single    Spouse name: Not on file   Number of children: 4   Years of education: Not on file   Highest education level: Not on file  Occupational History   Occupation: Production designer, theatre/television/film at [employer]  Tobacco Use   Smoking status: Every Day    Current packs/day: 0.10    Average packs/day: 0.1 packs/day for 3.0 years (0.3 ttl pk-yrs)    Types: Cigarettes   Smokeless tobacco: Never   Tobacco comments:    1-2 a day  Vaping Use   Vaping status: Never Used  Substance and Sexual Activity   Alcohol use: Yes  Alcohol/week: 0.0 standard drinks of alcohol    Comment: Ocassionally   Drug use: Yes    Frequency: 2.0 times per week    Types: Marijuana, MDMA (Ecstacy)   Sexual activity: Not Currently    Partners: Male    Birth control/protection: Condom, Surgical    Comment: pt. given condoms  Other Topics Concern   Not on file  Social History Narrative   Not on file   Social Determinants of Health   Financial Resource Strain: Not on file  Food Insecurity: Not on file  Transportation Needs: Not on file  Physical Activity: Not on file  Stress: Not on file  Social Connections: Not on file  Intimate Partner Violence: Not on file    Review of Systems: ROS negative except for what is noted on the assessment and plan.  Vitals:   12/31/22 1556  BP: 122/81  Pulse: (!) 108  Temp: 97.8 F (36.6 C)  TempSrc: Oral  SpO2: 100%   Weight: 115 lb 12.8 oz (52.5 kg)  Height: 5\' 1"  (1.549 m)    Physical Exam: Constitutional: well-appearing female sitting in chair, in no acute distress HENT: normocephalic atraumatic, mucous membranes moist Eyes: conjunctiva non-erythematous Cardiovascular: regular rate and rhythm, no m/r/g Pulmonary/Chest: normal work of breathing on room air, lungs clear to auscultation bilaterally Abdominal: soft, non-tender, non-distended MSK: normal bulk and tone Neurological: alert & oriented x 3, no focal deficit Skin: warm and dry Psych: normal mood and behavior  Assessment & Plan:   Patient seen with Dr. Sol Blazing  HIV disease [facility]) HIV infection with AIDS and Kaposi Sarcoma prescribed Biktaravy and Augmentin. She is leukopenic. Unfortunately, and likely influenced by her difficult social situation, Ms Guarisco has been irregular with Biktarvy treatment. As a result, her infection is not well controlled. She does continue to follow with ID and has an appointment upcoming on 7/15.  Non-healing skin lesion Suprapubic wound present for over a year that has progressively worsened. She was recently discharged from the hospital after a one-week stay for this issue. She was hospitalized because the wound began to drain yellow-green fluid and was associated with a fever up to 102 at home. CT pelvis revealed soft tissue wound with no abscess present. Attempts were made to procure a biopsy/culture of the wound but unfortunately were not successful. She has completed a course of azithromycin and bactrim and feels that the wound has healed somewhat.  She has wounds present in the suprapubic and vulvar region. Suprapubic wound present approximately 2 inches across with exposed dermis. Some areas missing granulation tissue but in general the wound is dry and non-inflamed without bleeding or fluid drainage. Vulvar exam deferred. She is attempting to follow-up with dermatology but has not connected yet, will see if we can  help with that. PLAN: Continue good wound hygiene and follow up with dermatology.  Anxiety and depression Pt has many medical and social stressors including her chronic disease, her living situation with her brother, and her finances. She was tearful multiple times during interview but adamantly denies any plan or desire to hurt herself or end her life. Her GAD score 12 and PHQ score 19.  We discussed counseling as well as the role of additional medical therapy. She is agreeable to trying Lexapro in addition to her mirtazapine. She is prescribed atarax and stated it was helpful, but she is no longer taken it due to personal concerns of addiction and the tendency of her friends to steal this medicine. Will start  low on the lexapro, the only other SSRI she has taken, Zoloft, made her feel dazed and "zonked out." PLAN: Referral to behavioral health - start Lexapro 5 qd - continue Mirtazapine 7.5 qd - D/c Atarax 25 (patient not taking)  Housing insecurity Pt currently lives with her younger brother's family in [place]. She would like to live independently but that is not financially viable for her at this time. Living situation and family strain contributes to her daily stress.  Transportation insecurity Pt noted that she almost didn't come in today because she traveled from [facility] and her car recently broke down. She was able to make it in today with the help of her son's friend. Noted that she would appreciate phone/virtual visits when possible.  Upon chart review, it appears pt was involved with Drs. Merian Capron and Dr. Hyacinth Meeker for gynecologic care regarding LSIL with planned colposcopy and cervical/vulvar biopsies, but might have been lost to follow-up. Was unable to discuss this with the patient today, but at future visits I would like to revisit this issue and consider a referral to the team that cared for her before, if possible. [name] lack of insurance and transportation  challenges might contribute to this.  At next visit, will readdress mood and follow up with Derm and ID as well.  Katheran James, D.O. [facility] Internal Medicine, PGY-1 Phone: [phone] Date 01/01/2023 Time 2:53 PM

## 2022-12-31 NOTE — Assessment & Plan Note (Addendum)
Pt has many medical and social stressors including her chronic disease, her living situation with her brother, and her finances. She was tearful multiple times during interview but adamantly denies any plan or desire to hurt herself or end her life. Her GAD score 12 and PHQ score 19.  We discussed counseling as well as the role of additional medical therapy. She is agreeable to trying Lexapro in addition to her mirtazapine. She is prescribed atarax and stated it was helpful, but she is no longer taken it due to personal concerns of addiction and the tendency of her friends to steal this medicine. Will start low on the lexapro, the only other SSRI she has taken, Zoloft, made her feel dazed and "zonked out." PLAN: Referral to behavioral health - start Lexapro 5 qd - continue Mirtazapine 7.5 qd - D/c Atarax 25 (patient not taking)

## 2023-01-01 ENCOUNTER — Encounter: Payer: Self-pay | Admitting: Student

## 2023-01-01 DIAGNOSIS — Z5982 Transportation insecurity: Secondary | ICD-10-CM | POA: Insufficient documentation

## 2023-01-01 DIAGNOSIS — Z59819 Housing instability, housed unspecified: Secondary | ICD-10-CM | POA: Insufficient documentation

## 2023-01-01 NOTE — Assessment & Plan Note (Signed)
Pt noted that she almost didn't come in today because she traveled from [facility] and her car recently broke down. She was able to make it in today with the help of her son's friend. Noted that she would appreciate phone/virtual visits when possible.

## 2023-01-01 NOTE — Assessment & Plan Note (Signed)
Pt currently lives with her younger brother's family in [place]. She would like to live independently but that is not financially viable for her at this time. Living situation and family strain contributes to her daily stress.

## 2023-01-04 ENCOUNTER — Ambulatory Visit: Payer: Self-pay | Admitting: Infectious Diseases

## 2023-01-06 NOTE — Addendum Note (Signed)
Addended by: Dickie La on: 01/06/2023 03:38 PM   Modules accepted: Level of Service

## 2023-01-06 NOTE — Progress Notes (Signed)
Internal Medicine Clinic Attending  I was physically present during the key portions of the resident provided service and participated in the medical decision making of patient's management care. I reviewed pertinent patient test results.  The assessment, diagnosis, and plan were formulated together and I agree with the documentation in the resident's note.  Lau, Grace, MD  

## 2023-01-19 ENCOUNTER — Ambulatory Visit: Payer: Self-pay | Admitting: Infectious Diseases

## 2023-01-27 ENCOUNTER — Ambulatory Visit: Payer: Medicaid Other

## 2023-01-27 ENCOUNTER — Ambulatory Visit (INDEPENDENT_AMBULATORY_CARE_PROVIDER_SITE_OTHER): Payer: Medicaid Other | Admitting: Pharmacist

## 2023-01-27 ENCOUNTER — Other Ambulatory Visit: Payer: Self-pay

## 2023-01-27 ENCOUNTER — Telehealth: Payer: Self-pay

## 2023-01-27 DIAGNOSIS — B20 Human immunodeficiency virus [HIV] disease: Secondary | ICD-10-CM | POA: Diagnosis not present

## 2023-01-27 DIAGNOSIS — Z79899 Other long term (current) drug therapy: Secondary | ICD-10-CM

## 2023-01-27 LAB — CBC WITH DIFFERENTIAL/PLATELET
Absolute Monocytes: 348 cells/uL (ref 200–950)
Basophils Absolute: 28 cells/uL (ref 0–200)
Basophils Relative: 0.6 %
Eosinophils Absolute: 644 cells/uL — ABNORMAL HIGH (ref 15–500)
Eosinophils Relative: 13.7 %
HCT: 33.8 % — ABNORMAL LOW (ref 35.0–45.0)
Hemoglobin: 10.5 g/dL — ABNORMAL LOW (ref 11.7–15.5)
Lymphs Abs: 1730 cells/uL (ref 850–3900)
MCH: 26.6 pg — ABNORMAL LOW (ref 27.0–33.0)
MCHC: 31.1 g/dL — ABNORMAL LOW (ref 32.0–36.0)
MCV: 85.8 fL (ref 80.0–100.0)
MPV: 10.4 fL (ref 7.5–12.5)
Monocytes Relative: 7.4 %
Neutro Abs: 1951 cells/uL (ref 1500–7800)
Neutrophils Relative %: 41.5 %
Platelets: 361 10*3/uL (ref 140–400)
RBC: 3.94 10*6/uL (ref 3.80–5.10)
RDW: 14.8 % (ref 11.0–15.0)
Total Lymphocyte: 36.8 %
WBC: 4.7 10*3/uL (ref 3.8–10.8)

## 2023-01-27 MED ORDER — BIKTARVY 50-200-25 MG PO TABS
1.0000 | ORAL_TABLET | Freq: Every day | ORAL | Status: DC
Start: 2023-01-27 — End: 2023-04-08

## 2023-01-27 NOTE — Progress Notes (Addendum)
01/27/2023  HPI: Beverly [name] is a 42 y.o. female who presents to the [facility] pharmacy clinic for HIV follow-up.  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   Nonintractable headache    Generalized lymphadenopathy    Skin rash associated with AIDS (HCC) 08/27/2020   Cellulitis of abdominal wall    HIV disease (HCC)    Weight loss    Nausea & vomiting 05/25/2018   Abdominal wall skin ulcer (HCC) 03/10/2018   Itching 03/10/2018   History of 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/11/2016   Pancytopenia (HCC) 01/09/2016   Non-healing skin lesion 05/06/2015   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   AZITHROMYCIN (ZITHROMAX) 600 MG TABLET    Take 2 tablets (1,200 mg total) by mouth once a week.   BICTEGRAVIR-EMTRICITABINE-TENOFOVIR AF (BIKTARVY) 50-200-25 MG TABS TABLET    Take 1 tablet by mouth daily. Try to take at the same time each day with or without food.   ESCITALOPRAM (LEXAPRO) 5 MG TABLET    Take 1 tablet (5 mg total) by mouth daily.   MIRTAZAPINE (REMERON) 7.5 MG TABLET    Take 1 tablet (7.5 mg total) by mouth at bedtime.    OXYCODONE (OXY IR/ROXICODONE) 5 MG IMMEDIATE RELEASE TABLET    Take 1 tablet (5 mg total) by mouth every 4 (four) hours as needed for moderate pain.   PREGABALIN (LYRICA) 25 MG CAPSULE    Take 1 capsule (25 mg total) by mouth 2 (two) times daily.   SULFAMETHOXAZOLE-TRIMETHOPRIM (BACTRIM DS) 800-160 MG TABLET    Take 1 tablet by mouth daily.   VITAMIN B-12 1000 MCG TABLET    Take 1 tablet (1,000 mcg total) by mouth daily.  Modified Medications   No medications on file  Discontinued Medications   No medications on file    Labs: Lab Results  Component Value Date   HIV1RNAQUANT 24,400 12/25/2022   HIV1RNAQUANT 142,000 (H) 10/19/2022   HIV1RNAQUANT 1,000 (H) 02/25/2022   CD4TABS <35 (L) 10/19/2022   CD4TABS <35 (L) 01/22/2022   CD4TABS <35 (L) 01/23/2021    RPR and STI Lab Results  Component Value Date   LABRPR NON-REACTIVE 02/25/2022   LABRPR NON REACTIVE 01/23/2021   LABRPR Reactive (A) 08/27/2020   LABRPR NON-REACTIVE 12/26/2019   LABRPR Non Reactive 04/06/2018    STI Results GC CT  02/25/2022 10:13 AM Negative  Negative   08/27/2020 10:59 AM Negative  Negative   12/06/2019 11:05 PM Negative  Negative   04/05/2019 12:00 AM Negative  Negative   03/10/2018 12:00 AM Negative  Negative   06/10/2016 12:00 AM Negative  Negative   03/05/2015 12:00  AM Negative  Negative     Hepatitis B Lab Results  Component Value Date   HEPBSAB NON-REACTIVE 10/19/2022   HEPBSAG NON-REACTIVE 10/19/2022   HEPBCAB NON REACTIVE 08/27/2020   Hepatitis C No results found for: "HEPCAB", "HCVRNAPCRQN" Hepatitis A No results found for: "HAV" Lipids: Lab Results  Component Value Date   CHOL 108 02/25/2022   TRIG 119 02/25/2022   HDL 43 (L) 02/25/2022   CHOLHDL 2.5 02/25/2022   VLDL 11 06/10/2016   LDLCALC 44 02/25/2022    Current HIV Regimen: Biktarvy  Assessment: Jerzie is here today with her sister to follow up for her HIV infection. She has missed several appointments lately and  was last seen in clinic on 10/19/22. Since then, she has been hospitalized for an abdominal wall wound and saw Dr. Daiva Eves and Dr. Luciana Axe inpatient. She was sent home with a prescription for Augmentin. She states that she is doing better since being discharged from the hospital and her swelling has gone down as well. She states that she has been taking her medications every day that she was discharged with. Her medicaid was not active so she has been unable to get refills from the pharmacy of her HIV and pain medications. She was able to get a few azithromycin tablets that she takes once weekly and will pick up the rest of the prescription today. She has not taken her Bactrim for PCP prophylaxis as she did not think she had to take both azithromycin and bactrim together. She finished her Augmentin course as prescribed. Last HIV RNA was 24,400 in July and CD4 count was <35.  She recently saw primary care and was started on Lexapro. She states that she was able to pick up some tablets of this at the pharmacy and pay out of her pocket. She hasn't noticed a difference yet, but I encouraged her to continue taking it every day as it may take awhile to see a difference. She is currently still homeless and staying with her brother. Her sister is with her today and is trying to help her get her medications in order. She is asking for a letter from her hospital stay for work but her sister said she will take her across the street to get that taken care of after she finishes here. I asked her biggest barrier to taking her medications every day and she said social issues and her current home situation. She doesn't have transportation so I asked her how she was going to pick up her medications from the pharmacy. She said she will get rides from her friends and family. She would not like her medications mailed because of privacy reasons. She states that the pharmacy near her is walking distance too.   She has a referral to see  dermatology but it looks like they mailed her an orange card to fill out and closed the referral since she was self pay. We will have to follow up on this at a later time.   We are trying to get her Medicaid situated. She has a [employer] address but wants to get Medicaid for [place]. Deanna will walk her up to Medicaid when she finishes here. I also had her meet with Johaura from [facility] today as well. I will give her a month of Biktarvy and a month of Bactrim samples today to take until we can get her insurance figured out. I will have her come back and see me in 3-4 weeks to  get more samples and to make sure she can access medications. Will also update lab work today. Answered all questions. I also provided her with a work note for today.   Medication Samples have been provided to the patient.  Drug name: Biktarvy        Strength: 50/200/25 mg       Qty: 4 bottles (28 tablets)   LOT: [id number]   Exp.Date: 01/2025  Dosing instructions: Take one tablet by mouth once daily  The patient has been instructed regarding the correct time, dose, and frequency of taking this medication, including desired effects and most common side effects.   Plan: - Continue Biktarvy - 4 weeks of samples provided today - Start Bactrim one tablet daily - samples provided today - HIV RNA, CD4, and CBC with diff today - Meet with [facility] - Meet with Medicaid upstairs - Follow up with me again on 02/16/23   L. , PharmD, BCIDP, AAHIVP, CPP Clinical Pharmacist Practitioner Infectious Diseases Clinical Pharmacist [facility] 01/27/2023, 10:46 AM

## 2023-01-27 NOTE — Telephone Encounter (Signed)
Patient requested to speak with Durwin Nora, NP regarding note stating she is able to return work, but as part time. States that she is off pain medication due to not having insurance.  Spoke with Durwin Nora, NP advised patient follow up with PCP regarding letter due to not seeing her since April. Also advised she contact [facility] to see if they are able to write letter. Patient verbalized understanding. Juanita Laster, RMA

## 2023-01-28 NOTE — Progress Notes (Signed)
Surprise, surprise...Marland KitchenMarland Kitchen

## 2023-02-02 ENCOUNTER — Ambulatory Visit (INDEPENDENT_AMBULATORY_CARE_PROVIDER_SITE_OTHER): Payer: Medicaid Other | Admitting: Licensed Clinical Social Worker

## 2023-02-02 DIAGNOSIS — F411 Generalized anxiety disorder: Secondary | ICD-10-CM | POA: Diagnosis not present

## 2023-02-02 NOTE — BH Specialist Note (Signed)
Integrated Behavioral Health via Telemedicine Visit  02/02/2023 SHASHANNA KREITZER [id number]  Number of Integrated Behavioral Health Clinician visits: 1- Initial Visit  Session Start time: 1330   Session End time: 1400  Total time in minutes: 30   Referring Provider: Gust Rung, DO Patient/Family location: Home [facility] Provider location: Office All persons participating in visit: [facility] and Patient Types of Service: Telephone visit and Introduction only  I connected with Veronia Beets and/or Kalman Shan Puskarich's  via  Telephone or Engineer, civil (consulting)  (Video is Surveyor, mining) and verified that I am speaking with the correct person using two identifiers. Discussed confidentiality: Yes   I discussed the limitations of telemedicine and the availability of in person appointments.  Discussed there is a possibility of technology failure and discussed alternative modes of communication if that failure occurs.  I discussed that engaging in this telemedicine visit, they consent to the provision of behavioral healthcare and the services will be billed under their insurance.  Patient and/or legal guardian expressed understanding and consented to Telemedicine visit: Yes   Presenting Concerns: Patient and/or family reports the following symptoms/concerns: Patient expressed having anxiety attacks and being out of work due to [employer] .  Duration of problem: for more than 3 years; Severity of problem: moderate  Patient and/or Family's Strengths/Protective Factors: Sense of purpose  Goals Addressed: Patient will:  Reduce symptoms of: anxiety   Increase knowledge and/or ability of: coping skills   Demonstrate ability to: Increase healthy adjustment to current life circumstances  Progress towards Goals: Ongoing  Interventions: Interventions utilized:  Motivational Interviewing and Mindfulness or Relaxation Training Standardized Assessments completed:  PHQ-SADS     12/31/2022    4:35 PM 12/31/2022    4:34 PM 10/19/2022    4:02 PM  PHQ-SADS Last 3 Score only  Total GAD-7 Score 19    PHQ Adolescent Score  11 11    Patient and/or Family Response: Patient agrees to continue services with [facility].   Assessment: [facility] introduces self to patient on today. Patient advised she understood [facility] services. [facility] provided patient with contact information for [facility].   Patient may benefit from ongoing counseling.  Plan: Follow up with behavioral health clinician on : Within the next 30 days.   I discussed the assessment and treatment plan with the patient and/or parent/guardian. They were provided an opportunity to ask questions and all were answered. They agreed with the plan and demonstrated an understanding of the instructions.   They were advised to call back or seek an in-person evaluation if the symptoms worsen or if the condition fails to improve as anticipated.  Christen Butter, MSW, LCSW-A She/Her Behavioral Health Clinician [facility]  Internal Medicine Center Direct Dial:[phone]  Fax [phone] Main Office Phone: [phone] [address] [place] [place], [place] [address] Website: [facility] Internal Medicine [facility]  [place], [place]  [place]

## 2023-02-15 NOTE — Progress Notes (Incomplete)
02/15/2023  HPI: Beverly [name] is a 42 y.o. female who presents to the [facility] pharmacy clinic for HIV follow-up.  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   Nonintractable headache    Generalized lymphadenopathy    Skin rash associated with AIDS (HCC) 08/27/2020   Cellulitis of abdominal wall    HIV disease (HCC)    Weight loss    Nausea & vomiting 05/25/2018   Abdominal wall skin ulcer (HCC) 03/10/2018   Itching 03/10/2018   History of 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/11/2016   Pancytopenia (HCC) 01/09/2016   Non-healing skin lesion 05/06/2015   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   AZITHROMYCIN (ZITHROMAX) 600 MG TABLET    Take 2 tablets (1,200 mg total) by mouth once a week.   BICTEGRAVIR-EMTRICITABINE-TENOFOVIR AF (BIKTARVY) 50-200-25 MG TABS TABLET    Take 1 tablet by mouth daily. Try to take at the same time each day with or without food.   BICTEGRAVIR-EMTRICITABINE-TENOFOVIR AF (BIKTARVY) 50-200-25 MG TABS TABLET    Take 1 tablet by mouth daily for 28 days.   ESCITALOPRAM (LEXAPRO) 5 MG TABLET    Take 1 tablet (5 mg  total) by mouth daily.   MIRTAZAPINE (REMERON) 7.5 MG TABLET    Take 1 tablet (7.5 mg total) by mouth at bedtime.   OXYCODONE (OXY IR/ROXICODONE) 5 MG IMMEDIATE RELEASE TABLET    Take 1 tablet (5 mg total) by mouth every 4 (four) hours as needed for moderate pain.   PREGABALIN (LYRICA) 25 MG CAPSULE    Take 1 capsule (25 mg total) by mouth 2 (two) times daily.   SULFAMETHOXAZOLE-TRIMETHOPRIM (BACTRIM DS) 800-160 MG TABLET    Take 1 tablet by mouth daily.   VITAMIN B-12 1000 MCG TABLET    Take 1 tablet (1,000 mcg total) by mouth daily.  Modified Medications   No medications on file  Discontinued Medications   No medications on file    Labs: Lab Results  Component Value Date   HIV1RNAQUANT 138 (H) 01/27/2023   HIV1RNAQUANT 24,400 12/25/2022   HIV1RNAQUANT 142,000 (H) 10/19/2022   CD4TABS <35 (L) 10/19/2022   CD4TABS <35 (L) 01/22/2022   CD4TABS <35 (L) 01/23/2021    RPR and STI Lab Results  Component Value Date   LABRPR NON-REACTIVE 02/25/2022   LABRPR NON REACTIVE 01/23/2021   LABRPR Reactive (A) 08/27/2020   LABRPR NON-REACTIVE 12/26/2019   LABRPR Non Reactive 04/06/2018    STI Results GC CT  02/25/2022 10:13 AM Negative  Negative   08/27/2020 10:59 AM Negative  Negative   12/06/2019 11:05 PM Negative  Negative   04/05/2019 12:00 AM Negative  Negative   03/10/2018 12:00 AM Negative  Negative   06/10/2016 12:00 AM Negative  Negative   03/05/2015 12:00 AM Negative  Negative     Hepatitis B Lab Results  Component Value Date   HEPBSAB NON-REACTIVE 10/19/2022   HEPBSAG NON-REACTIVE 10/19/2022   HEPBCAB NON REACTIVE 08/27/2020   Hepatitis C No results found for: "HEPCAB", "HCVRNAPCRQN" Hepatitis A No results found for: "HAV" Lipids: Lab Results  Component Value Date   CHOL 108 02/25/2022   TRIG 119 02/25/2022   HDL 43 (L) 02/25/2022   CHOLHDL 2.5 02/25/2022   VLDL 11 06/10/2016   LDLCALC 44 02/25/2022    Current HIV  Regimen: Biktarvy  Assessment: Beverly [name] presents today for HIV treatment follow-up. She was seen at the beginning of the month after having missed multiple appointments and being off treatment. Her last HIV VL was 138 and her last CD4 count was <20. She says that she has been tolerating her medications since last visit. She was able to get Medicaid and should be able to get prescriptions from her preferred pharmacy. She endorsed being able to get her prescriptions from the [facility] in [place], so will send scripts there.   She is now being followed by behavioral health. She endorses that her Lexapro has been helping but that she does continue to have anxiety attacks at work. I encouraged her to reach out to the prescriber of Lexapro to work with them to titrate her to the most appropriate dose for her.   She did ask about her pain medication today. I informed her that we would not manage that within the clinic and if she were to need some urgently she would have to go to the ED or she could wait for an appointment with the pain clinic.   She states that she never heard from dermatology. A referral was sent after she was discharged from the hospital but it looks like the dermatology office sent her a letter in the mail. When verifying Beverly [name]'s address, she informed me that the street name was incorrect and that may be why she didn't receive the letter. I asked [name] to put in a new referral for Beverly [name] and we also provided Beverly [name] with the phone number to the dermatology office.   She was in need of more covers for her abdominal wounds. Provided her with dressings. She also requested food from the food pantry. Provided her with one bag of food items. Encouraged her to continue to come to her appointments and work to keep herself healthy.   Plan: - Continue Biktarvy, azithromycin and Bactrim. Refills sent to [facility] pharmacy - Follow-up scheduled on 9/24 with [name] - Call with any  questions or concerns   Lennie Muckle, PharmD PGY1 Pharmacy Resident 02/15/2023 12:24 PM

## 2023-02-16 ENCOUNTER — Other Ambulatory Visit (HOSPITAL_COMMUNITY): Payer: Self-pay

## 2023-02-16 ENCOUNTER — Ambulatory Visit: Payer: Medicaid Other | Admitting: Pharmacist

## 2023-02-16 ENCOUNTER — Other Ambulatory Visit: Payer: Self-pay

## 2023-02-16 DIAGNOSIS — L98499 Non-pressure chronic ulcer of skin of other sites with unspecified severity: Secondary | ICD-10-CM

## 2023-02-16 DIAGNOSIS — B20 Human immunodeficiency virus [HIV] disease: Secondary | ICD-10-CM | POA: Diagnosis not present

## 2023-02-16 MED ORDER — SULFAMETHOXAZOLE-TRIMETHOPRIM 800-160 MG PO TABS: 1.0000 | ORAL_TABLET | Freq: Every day | ORAL | 1 refills | Status: DC

## 2023-02-16 MED ORDER — AZITHROMYCIN 600 MG PO TABS: 1200.0000 mg | ORAL_TABLET | ORAL | 1 refills | Status: DC

## 2023-02-16 MED ORDER — BICTEGRAVIR-EMTRICITAB-TENOFOV 50-200-25 MG PO TABS: 1.0000 | ORAL_TABLET | Freq: Every day | ORAL | 1 refills | Status: DC

## 2023-02-16 NOTE — Patient Instructions (Addendum)
Will put in a referral for dermatology for you -   If you don't hear from them please call to arrange an appointment   [facility] [address]   [phone]

## 2023-03-05 ENCOUNTER — Encounter: Payer: Self-pay | Admitting: Infectious Diseases

## 2023-03-16 ENCOUNTER — Ambulatory Visit: Payer: Medicaid Other | Admitting: Infectious Diseases

## 2023-04-07 ENCOUNTER — Other Ambulatory Visit: Payer: Self-pay

## 2023-04-07 ENCOUNTER — Emergency Department (HOSPITAL_COMMUNITY)
Admission: EM | Admit: 2023-04-07 | Discharge: 2023-04-08 | Disposition: A | Discharge status: Home / Self Care | Payer: Medicaid Other | Attending: Emergency Medicine | Admitting: Emergency Medicine

## 2023-04-07 DIAGNOSIS — Z87891 Personal history of nicotine dependence: Secondary | ICD-10-CM | POA: Insufficient documentation

## 2023-04-07 DIAGNOSIS — N390 Urinary tract infection, site not specified: Secondary | ICD-10-CM | POA: Insufficient documentation

## 2023-04-07 DIAGNOSIS — J45909 Unspecified asthma, uncomplicated: Secondary | ICD-10-CM | POA: Diagnosis not present

## 2023-04-07 DIAGNOSIS — Z20822 Contact with and (suspected) exposure to covid-19: Secondary | ICD-10-CM | POA: Diagnosis not present

## 2023-04-07 DIAGNOSIS — Z21 Asymptomatic human immunodeficiency virus [HIV] infection status: Secondary | ICD-10-CM | POA: Diagnosis not present

## 2023-04-07 DIAGNOSIS — R112 Nausea with vomiting, unspecified: Secondary | ICD-10-CM

## 2023-04-07 DIAGNOSIS — R1012 Left upper quadrant pain: Secondary | ICD-10-CM | POA: Diagnosis present

## 2023-04-07 LAB — COMPREHENSIVE METABOLIC PANEL
ALT: 11 U/L (ref 0–44)
AST: 22 U/L (ref 15–41)
Albumin: 2.8 g/dL — ABNORMAL LOW (ref 3.5–5.0)
Alkaline Phosphatase: 85 U/L (ref 38–126)
Anion gap: 12 (ref 5–15)
BUN: 14 mg/dL (ref 6–20)
CO2: 18 mmol/L — ABNORMAL LOW (ref 22–32)
Calcium: 8.4 mg/dL — ABNORMAL LOW (ref 8.9–10.3)
Chloride: 97 mmol/L — ABNORMAL LOW (ref 98–111)
Creatinine, Ser: 0.76 mg/dL (ref 0.44–1.00)
GFR, Estimated: >60 mL/min (ref >60)
Glucose, Bld: 94 mg/dL (ref 70–99)
Potassium: 3 mmol/L — ABNORMAL LOW (ref 3.5–5.1)
Sodium: 127 mmol/L — ABNORMAL LOW (ref 135–145)
Total Bilirubin: 0.5 mg/dL (ref 0.3–1.2)
Total Protein: 9.7 g/dL — ABNORMAL HIGH (ref 6.5–8.1)

## 2023-04-07 LAB — CBC WITH DIFFERENTIAL/PLATELET
Abs Immature Granulocytes: 0.35 10*3/uL — ABNORMAL HIGH (ref 0.00–0.07)
Basophils Absolute: 0 10*3/uL (ref 0.0–0.1)
Basophils Relative: 0 %
Eosinophils Absolute: 0 10*3/uL (ref 0.0–0.5)
Eosinophils Relative: 0 %
HCT: 34.2 % — ABNORMAL LOW (ref 36.0–46.0)
Hemoglobin: 10.7 g/dL — ABNORMAL LOW (ref 12.0–15.0)
Immature Granulocytes: 7 %
Lymphocytes Relative: 21 %
Lymphs Abs: 1.1 10*3/uL (ref 0.7–4.0)
MCH: 26 pg (ref 26.0–34.0)
MCHC: 31.3 g/dL (ref 30.0–36.0)
MCV: 83.2 fL (ref 80.0–100.0)
Monocytes Absolute: 1 10*3/uL (ref 0.1–1.0)
Monocytes Relative: 19 %
Neutro Abs: 2.7 10*3/uL (ref 1.7–7.7)
Neutrophils Relative %: 53 %
Platelets: 256 10*3/uL (ref 150–400)
RBC: 4.11 MIL/uL (ref 3.87–5.11)
RDW: 14.6 % (ref 11.5–15.5)
WBC: 5.2 10*3/uL (ref 4.0–10.5)
nRBC: 0 % (ref 0.0–0.2)

## 2023-04-07 LAB — RESP PANEL BY RT-PCR (RSV, FLU A&B, COVID)  RVPGX2
Influenza A by PCR: NEGATIVE
Influenza B by PCR: NEGATIVE
Resp Syncytial Virus by PCR: NEGATIVE
SARS Coronavirus 2 by RT PCR: NEGATIVE

## 2023-04-07 LAB — LIPASE, BLOOD: Lipase: 33 U/L (ref 11–51)

## 2023-04-07 MED ORDER — ONDANSETRON 4 MG PO TBDP
4.0000 mg | ORAL_TABLET | Freq: Once | ORAL | Status: AC | PRN
  Administered 2023-04-07: 4 mg via ORAL
  Filled 2023-04-07: qty 1

## 2023-04-07 NOTE — ED Triage Notes (Signed)
Reports ongoing NVD with generalized abd pain since Wednesday 10/9. Sts concern for ongoing s/s with fevers and chills. S/s associated with cough and runny nose. Took cold and sinus medicine around 5 pm.

## 2023-04-08 ENCOUNTER — Emergency Department (HOSPITAL_COMMUNITY): Payer: Medicaid Other

## 2023-04-08 LAB — BASIC METABOLIC PANEL
Anion gap: 9 (ref 5–15)
BUN: 14 mg/dL (ref 6–20)
CO2: 19 mmol/L — ABNORMAL LOW (ref 22–32)
Calcium: 7.6 mg/dL — ABNORMAL LOW (ref 8.9–10.3)
Chloride: 101 mmol/L (ref 98–111)
Creatinine, Ser: 0.66 mg/dL (ref 0.44–1.00)
GFR, Estimated: >60 mL/min (ref >60)
Glucose, Bld: 92 mg/dL (ref 70–99)
Potassium: 3.1 mmol/L — ABNORMAL LOW (ref 3.5–5.1)
Sodium: 129 mmol/L — ABNORMAL LOW (ref 135–145)

## 2023-04-08 LAB — URINALYSIS, ROUTINE W REFLEX MICROSCOPIC
Bilirubin Urine: NEGATIVE
Glucose, UA: NEGATIVE mg/dL
Ketones, ur: NEGATIVE mg/dL
Nitrite: NEGATIVE
Protein, ur: 100 mg/dL — AB
Specific Gravity, Urine: 1.017 (ref 1.005–1.030)
WBC, UA: >50 WBC/hpf (ref 0–5)
pH: 6 (ref 5.0–8.0)

## 2023-04-08 LAB — HCG, SERUM, QUALITATIVE: Preg, Serum: NEGATIVE

## 2023-04-08 MED ORDER — CIPROFLOXACIN HCL 500 MG PO TABS: 500.0000 mg | ORAL_TABLET | Freq: Two times a day (BID) | ORAL | 0 refills | Status: AC

## 2023-04-08 MED ORDER — PROCHLORPERAZINE EDISYLATE 10 MG/2ML IJ SOLN
10.0000 mg | Freq: Once | INTRAMUSCULAR | Status: AC
  Administered 2023-04-08: 10 mg via INTRAVENOUS
  Filled 2023-04-08: qty 2

## 2023-04-08 MED ORDER — SODIUM CHLORIDE 0.9 % IV BOLUS
1000.0000 mL | Freq: Once | INTRAVENOUS | Status: AC
  Administered 2023-04-08: 1000 mL via INTRAVENOUS

## 2023-04-08 MED ORDER — SODIUM CHLORIDE 0.9 % IV SOLN
1.0000 g | Freq: Once | INTRAVENOUS | Status: AC
  Administered 2023-04-08: 1 g via INTRAVENOUS
  Filled 2023-04-08: qty 10

## 2023-04-08 MED ORDER — ONDANSETRON 4 MG PO TBDP: 4.0000 mg | ORAL_TABLET | Freq: Three times a day (TID) | ORAL | 0 refills | Status: AC | PRN

## 2023-04-08 NOTE — ED Notes (Addendum)
Pt provided with sprite and water for oral hydration/ pt refused gatorade/pedialyte

## 2023-04-08 NOTE — ED Provider Notes (Signed)
[facility] EMERGENCY DEPARTMENT AT [facility] Provider Note  CSN: [id number] Arrival date & time: 04/07/23 2226  Chief Complaint(s) Abdominal Pain  HPI Beverly [name] is a 42 y.o. 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  Patient endorsing upper abdominal pain mostly in the left upper quadrant attributed to emesis.  Patient denies any fevers or chills.  No cough or congestion.  Patient has been able to tolerate some p.o. and meds.  However feels dehydrated.  She does report urinary urgency.  No dysuria.  HPI  Past Medical History Past Medical History:  Diagnosis Date   Abscess    AIDS (HCC) 03/19/2015   Anxiety    Asthma    Blood transfusion without reported diagnosis    Depression    HIV infection (HCC)    Homeless 03/19/2015   states stays with family or friends. Does not rent or own a home, but does not live on streets.   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   Nonintractable headache    Generalized lymphadenopathy    Skin rash associated with AIDS (HCC) 08/27/2020   Cellulitis of abdominal wall    HIV disease (HCC)    Weight loss    Nausea & vomiting 05/25/2018   Abdominal wall skin ulcer (HCC) 03/10/2018   Itching 03/10/2018   History of 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/11/2016   Pancytopenia (HCC) 01/09/2016   Non-healing skin lesion 05/06/2015   Neuropathy due  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 Authorizing Provider  acetaminophen (TYLENOL) 325 MG tablet Take 2 tablets (650 mg total) by mouth every 6 (six) hours as needed for mild pain, fever or headache. 12/24/22  Yes Lonia Blood, MD  bictegravir-emtricitabine-tenofovir AF (BIKTARVY) 50-200-25 MG TABS tablet Take 1 tablet by mouth daily. Try to take at the same time each day with or without food. 02/16/23  Yes Blanchard Kelch, NP  ciprofloxacin (CIPRO) 500 MG tablet Take 1 tablet (500 mg total) by mouth every 12 (twelve) hours for 5 days. 04/08/23 04/13/23 Yes Lucan Riner, Amadeo Garnet, MD  escitalopram (LEXAPRO) 5 MG tablet Take 1 tablet (5 mg total) by mouth daily. 12/31/22  Yes Juberg, Christopher, DO  ondansetron (ZOFRAN-ODT) 4 MG disintegrating tablet Take 1 tablet (4 mg total) by mouth every 8 (eight) hours as needed for up to 3 days for nausea or vomiting. 04/08/23 04/11/23 Yes Artis Beggs, Amadeo Garnet, MD  oxyCODONE (OXY IR/ROXICODONE) 5 MG immediate release tablet Take 1 tablet (5 mg total) by mouth  every 4 (four) hours as needed for moderate pain. 12/24/22  Yes Lonia Blood, MD  sulfamethoxazole-trimethoprim (BACTRIM DS) 800-160 MG tablet Take 1 tablet by mouth daily. 02/16/23  Yes Blanchard Kelch, NP  azithromycin (ZITHROMAX) 600 MG tablet Take 2 tablets (1,200 mg total) by mouth once a week. 02/16/23   Blanchard Kelch, NP  mirtazapine (REMERON) 7.5 MG tablet Take 1 tablet (7.5 mg total) by mouth at bedtime. 12/28/22   Lonia Blood, MD  pregabalin  (LYRICA) 25 MG capsule Take 1 capsule (25 mg total) by mouth 2 (two) times daily. Patient not taking: Reported on 10/19/2022 01/22/22   Blanchard Kelch, NP  vitamin B-12 1000 MCG tablet Take 1 tablet (1,000 mcg total) by mouth daily. Patient not taking: Reported on 10/26/2021 01/28/21   Almon Hercules, MD                                                                                                                                    Allergies Bee venom, Ferrlecit [na ferric gluc cplx in sucrose], and Morphine and codeine  Review of Systems Review of Systems As noted in HPI  Physical Exam Vital Signs  I have reviewed the triage vital signs BP (!) 154/142   Pulse 98   Temp 98.5 F (36.9 C) (Oral)   Resp 18   Ht 5\' 1"  (1.549 m)   Wt 52.2 kg   LMP 03/31/2023   SpO2 100%   BMI 21.73 kg/m   Physical Exam Vitals reviewed.  Constitutional:      General: She is not in acute distress.    Appearance: She is well-developed. She is not diaphoretic.  HENT:     Head: Normocephalic and atraumatic.     Right Ear: External ear normal.     Left Ear: External ear normal.     Nose: Nose normal.  Eyes:     General: No scleral icterus.    Conjunctiva/sclera: Conjunctivae normal.  Neck:     Trachea: Phonation normal.  Cardiovascular:     Rate and Rhythm: Normal rate and regular rhythm.  Pulmonary:     Effort: Pulmonary effort is normal. No respiratory distress.     Breath sounds: No stridor.  Abdominal:     General: There is no distension.     Tenderness: There is abdominal tenderness in the left upper quadrant.  Musculoskeletal:        General: Normal range of motion.     Cervical back: Normal range of motion.  Neurological:     Mental Status: She is alert and oriented to person, place, and time.  Psychiatric:        Behavior: Behavior normal.     ED Results and Treatments Labs (all labs ordered are listed, but only abnormal results are displayed) Labs Reviewed  COMPREHENSIVE  METABOLIC PANEL - Abnormal; Notable for the following components:  Result Value   Sodium 127 (*)    Potassium 3.0 (*)    Chloride 97 (*)    CO2 18 (*)    Calcium 8.4 (*)    Total Protein 9.7 (*)    Albumin 2.8 (*)    All other components within normal limits  URINALYSIS, ROUTINE W REFLEX MICROSCOPIC - Abnormal; Notable for the following components:   APPearance HAZY (*)    Hgb urine dipstick SMALL (*)    Protein, ur 100 (*)    Leukocytes,Ua LARGE (*)    Bacteria, UA RARE (*)    All other components within normal limits  CBC WITH DIFFERENTIAL/PLATELET - Abnormal; Notable for the following components:   Hemoglobin 10.7 (*)    HCT 34.2 (*)    Abs Immature Granulocytes 0.35 (*)    All other components within normal limits  BASIC METABOLIC PANEL - Abnormal; Notable for the following components:   Sodium 129 (*)    Potassium 3.1 (*)    CO2 19 (*)    Calcium 7.6 (*)    All other components within normal limits  RESP PANEL BY RT-PCR (RSV, FLU A&B, COVID)  RVPGX2  CULTURE, BLOOD (ROUTINE X 2)  CULTURE, BLOOD (ROUTINE X 2)  LIPASE, BLOOD  HCG, SERUM, QUALITATIVE                                                                                                                         EKG  EKG Interpretation Date/Time:    Ventricular Rate:    PR Interval:    QRS Duration:    QT Interval:    QTC Calculation:   R Axis:      Text Interpretation:         Radiology DG Chest 2 View  Result Date: 04/08/2023 CLINICAL DATA:  42 year old female with history of cough. Abdominal pain, fever and chills. EXAM: CHEST - 2 VIEW COMPARISON:  Chest x-ray 01/23/2021. FINDINGS: Lung volumes are normal. No consolidative airspace disease. No pleural effusions. No pneumothorax. No pulmonary nodule or mass noted. Pulmonary vasculature and the cardiomediastinal silhouette are within normal limits. IMPRESSION: No radiographic evidence of acute cardiopulmonary disease. Electronically Signed   By:  Trudie Reed M.D.   On: 04/08/2023 05:23    Medications Ordered in ED Medications  ondansetron (ZOFRAN-ODT) disintegrating tablet 4 mg (4 mg Oral Given 04/07/23 2242)  sodium chloride 0.9 % bolus 1,000 mL (0 mLs Intravenous Stopped 04/08/23 0322)  prochlorperazine (COMPAZINE) injection 10 mg (10 mg Intravenous Given 04/08/23 0222)  cefTRIAXone (ROCEPHIN) 1 g in sodium chloride 0.9 % 100 mL IVPB (0 g Intravenous Stopped 04/08/23 0322)   Procedures Procedures  (including critical care time) Medical Decision Making / ED Course   Medical Decision Making Amount and/or Complexity of Data Reviewed Labs: ordered. Radiology: ordered.  Risk Prescription drug management.    Workup for differential diagnosis listed below  CBC without leukocytosis.  Mild anemia. CMP with hyponatremia and hypokalemia.  Likely from  GI losses.  No renal insufficiency.  No evidence of bili obstruction or pancreatitis. hCG negative ruling out pregnancy related process.  Chest x-ray without evidence of pneumonia Viral panel negative for COVID, influenza, RSV  UA suspicious for urinary tract infection.  Abdomen with only left upper quadrant tenderness to palpation, low suspicion for serious intra-abdominal inflammatory/infectious process requiring CT imaging at this time.  Patient was provided with IV fluids and antiemetics.  Repeat metabolic panel showed improved electrolytes. Patient has been able to tolerate oral intake.  States that she feels well enough to go home. She does not appear to be septic at this time and likely appropriate for outpatient management.      Final Clinical Impression(s) / ED Diagnoses Final diagnoses:  Left upper quadrant abdominal pain  Nausea vomiting and diarrhea  Lower urinary tract infectious disease   The patient appears reasonably screened and/or stabilized for discharge and I doubt any other medical condition or other [facility] requiring further screening, evaluation,  or treatment in the ED at this time. I have discussed the findings, Dx and Tx plan with the patient/family who expressed understanding and agree(s) with the plan. Discharge instructions discussed at length. The patient/family was given strict return precautions who verbalized understanding of the instructions. No further questions at time of discharge.  Disposition: Discharge  Condition: Good  ED Discharge Orders          Ordered    ciprofloxacin (CIPRO) 500 MG tablet  Every 12 hours        04/08/23 0752    ondansetron (ZOFRAN-ODT) 4 MG disintegrating tablet  Every 8 hours PRN        04/08/23 5784              Follow Up: Primary care provider  Call  to schedule an appointment for close follow up     This chart was dictated using voice recognition software.  Despite best efforts to proofread,  errors can occur which can change the documentation meaning.    Nira Conn, MD 04/08/23 [phone]

## 2023-04-08 NOTE — ED Notes (Signed)
Pt ambulated to BR without need of assistance. Pt now resting comfortably back in bed. Pt has no other needs at this time. Call light within reach and bed locked and in lowest position.

## 2023-04-13 LAB — CULTURE, BLOOD (ROUTINE X 2)
Culture: NO GROWTH
Culture: NO GROWTH
Special Requests: ADEQUATE
Special Requests: ADEQUATE

## 2023-11-01 NOTE — Progress Notes (Signed)
 The ASCVD Risk score (Arnett DK, et al., 2019) failed to calculate for the following reasons:   The valid total cholesterol range is 130 to 320 mg/dL  Beverly [name], BSN, RN

## 2023-11-18 ENCOUNTER — Emergency Department (HOSPITAL_BASED_OUTPATIENT_CLINIC_OR_DEPARTMENT_OTHER)

## 2023-11-18 ENCOUNTER — Other Ambulatory Visit: Payer: Self-pay

## 2023-11-18 ENCOUNTER — Other Ambulatory Visit (HOSPITAL_COMMUNITY): Payer: Self-pay

## 2023-11-18 ENCOUNTER — Emergency Department (HOSPITAL_BASED_OUTPATIENT_CLINIC_OR_DEPARTMENT_OTHER)
Admission: EM | Admit: 2023-11-18 | Discharge: 2023-11-18 | Disposition: A | Discharge status: Home / Self Care | Attending: Emergency Medicine | Admitting: Emergency Medicine

## 2023-11-18 ENCOUNTER — Other Ambulatory Visit (HOSPITAL_BASED_OUTPATIENT_CLINIC_OR_DEPARTMENT_OTHER): Payer: Self-pay

## 2023-11-18 DIAGNOSIS — F17210 Nicotine dependence, cigarettes, uncomplicated: Secondary | ICD-10-CM | POA: Insufficient documentation

## 2023-11-18 DIAGNOSIS — Z5900 Homelessness unspecified: Secondary | ICD-10-CM | POA: Diagnosis not present

## 2023-11-18 DIAGNOSIS — N3000 Acute cystitis without hematuria: Secondary | ICD-10-CM | POA: Diagnosis not present

## 2023-11-18 DIAGNOSIS — R52 Pain, unspecified: Secondary | ICD-10-CM

## 2023-11-18 DIAGNOSIS — M791 Myalgia, unspecified site: Secondary | ICD-10-CM | POA: Diagnosis present

## 2023-11-18 DIAGNOSIS — B20 Human immunodeficiency virus [HIV] disease: Secondary | ICD-10-CM

## 2023-11-18 DIAGNOSIS — J45909 Unspecified asthma, uncomplicated: Secondary | ICD-10-CM | POA: Insufficient documentation

## 2023-11-18 LAB — COMPREHENSIVE METABOLIC PANEL WITH GFR
ALT: 16 U/L (ref 0–44)
AST: 31 U/L (ref 15–41)
Albumin: 3.4 g/dL — ABNORMAL LOW (ref 3.5–5.0)
Alkaline Phosphatase: 126 U/L (ref 38–126)
Anion gap: 8 (ref 5–15)
BUN: 12 mg/dL (ref 6–20)
CO2: 23 mmol/L (ref 22–32)
Calcium: 8.4 mg/dL — ABNORMAL LOW (ref 8.9–10.3)
Chloride: 105 mmol/L (ref 98–111)
Creatinine, Ser: 0.59 mg/dL (ref 0.44–1.00)
GFR, Estimated: >60 mL/min (ref >60)
Glucose, Bld: 85 mg/dL (ref 70–99)
Potassium: 3.2 mmol/L — ABNORMAL LOW (ref 3.5–5.1)
Sodium: 136 mmol/L (ref 135–145)
Total Bilirubin: 0.4 mg/dL (ref 0.0–1.2)
Total Protein: 8.6 g/dL — ABNORMAL HIGH (ref 6.5–8.1)

## 2023-11-18 LAB — URINALYSIS, ROUTINE W REFLEX MICROSCOPIC
Bilirubin Urine: NEGATIVE
Glucose, UA: NEGATIVE mg/dL
Ketones, ur: NEGATIVE mg/dL
Nitrite: NEGATIVE
Protein, ur: 100 mg/dL — AB
Specific Gravity, Urine: 1.02 (ref 1.005–1.030)
pH: 8.5 — ABNORMAL HIGH (ref 5.0–8.0)

## 2023-11-18 LAB — URINALYSIS, MICROSCOPIC (REFLEX)

## 2023-11-18 LAB — CBC
HCT: 30.7 % — ABNORMAL LOW (ref 36.0–46.0)
Hemoglobin: 9.6 g/dL — ABNORMAL LOW (ref 12.0–15.0)
MCH: 25.7 pg — ABNORMAL LOW (ref 26.0–34.0)
MCHC: 31.3 g/dL (ref 30.0–36.0)
MCV: 82.1 fL (ref 80.0–100.0)
Platelets: 183 10*3/uL (ref 150–400)
RBC: 3.74 MIL/uL — ABNORMAL LOW (ref 3.87–5.11)
RDW: 16.3 % — ABNORMAL HIGH (ref 11.5–15.5)
WBC: 2.1 10*3/uL — ABNORMAL LOW (ref 4.0–10.5)
nRBC: 0 % (ref 0.0–0.2)

## 2023-11-18 LAB — RESP PANEL BY RT-PCR (RSV, FLU A&B, COVID)  RVPGX2
Influenza A by PCR: NEGATIVE
Influenza B by PCR: NEGATIVE
Resp Syncytial Virus by PCR: NEGATIVE
SARS Coronavirus 2 by RT PCR: NEGATIVE

## 2023-11-18 MED ORDER — CEPHALEXIN 500 MG PO CAPS
500.0000 mg | ORAL_CAPSULE | Freq: Three times a day (TID) | ORAL | 0 refills | Status: DC
  Filled 2023-11-18: qty 21, 7d supply, fill #0

## 2023-11-18 MED ORDER — KETOROLAC TROMETHAMINE 15 MG/ML IJ SOLN
15.0000 mg | Freq: Once | INTRAMUSCULAR | Status: AC
  Administered 2023-11-18: 15 mg via INTRAVENOUS
  Filled 2023-11-18: qty 1

## 2023-11-18 MED ORDER — SODIUM CHLORIDE 0.9 % IV BOLUS
1000.0000 mL | Freq: Once | INTRAVENOUS | Status: AC
  Administered 2023-11-18: 1000 mL via INTRAVENOUS

## 2023-11-18 MED ORDER — ONDANSETRON HCL 4 MG/2ML IJ SOLN
4.0000 mg | Freq: Once | INTRAMUSCULAR | Status: AC
  Administered 2023-11-18: 4 mg via INTRAVENOUS
  Filled 2023-11-18: qty 2

## 2023-11-18 MED ORDER — BICTEGRAVIR-EMTRICITAB-TENOFOV 50-200-25 MG PO TABS
1.0000 | ORAL_TABLET | Freq: Every day | ORAL | 1 refills | Status: DC
  Filled 2023-11-18: qty 30, 30d supply, fill #0

## 2023-11-18 MED ORDER — BICTEGRAVIR-EMTRICITAB-TENOFOV 50-200-25 MG PO TABS
1.0000 | ORAL_TABLET | Freq: Every day | ORAL | 1 refills | Status: DC
Start: 2023-11-18 — End: 2024-02-12
  Filled 2023-11-18 (×2): qty 30, 30d supply, fill #0

## 2023-11-18 MED ORDER — CEPHALEXIN 500 MG PO CAPS
500.0000 mg | ORAL_CAPSULE | Freq: Three times a day (TID) | ORAL | 0 refills | Status: AC
  Filled 2023-11-18: qty 21, 7d supply, fill #0

## 2023-11-18 MED ORDER — CEPHALEXIN 250 MG PO CAPS
500.0000 mg | ORAL_CAPSULE | Freq: Once | ORAL | Status: AC
  Administered 2023-11-18: 500 mg via ORAL
  Filled 2023-11-18: qty 2

## 2023-11-18 NOTE — ED Provider Notes (Addendum)
 [facility] DEPT [facility] Emergency Department Provider Note MRN:  [id number]  Arrival date & time: 11/18/23     Chief Complaint   Generalized Body Aches and Fever   History of Present Illness   Beverly [name] is a 43 y.o. year-old female with a history of HIV, AIDS presenting to the ED with chief complaint of generalized body aches and fever.  Patient explains that she has been not taking her HIV medicine for the past month.  She is endorsing generalized weakness and malaise in total body aches.  Fever up to 103 reported at home a few days ago.  Review of Systems  A thorough review of systems was obtained and all systems are negative except as noted in the HPI and PMH.   [name] Health History    Past Medical History:  Diagnosis Date   Abscess    AIDS (HCC) 03/19/2015   Anxiety    Asthma    Blood transfusion without reported diagnosis    Depression    HIV infection (HCC)    Homeless 03/19/2015   states stays with family or friends. Does not rent or own a home, but does not live on streets.   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;  Service: Gynecology;  Laterality: N/A;   CESAREAN SECTION     DILATION AND CURETTAGE OF UTERUS N/A 04/14/2018   Procedure: ENDOCERVICAL CURETTAGE;  Surgeon: Lyn Sanders, MD;  Location: [facility];  Service: Gynecology;  Laterality: N/A;   INCISION AND DRAINAGE ABSCESS Right 01/10/2016   Procedure: INCISION AND DRAINAGE RIGHT BUTTOCK, LEFT LABIAL , EXCISION AND DRAINAGE OF  RIGHT AXILLARY ABSCESS;  Surgeon: Ayesha Lente, MD;  Location: [facility];  Service: General;  Laterality: Right;   TUBAL LIGATION  2005   VULVA Asher Blade BIOPSY N/A 04/14/2018   Procedure: VULVAR BIOPSIES;  Surgeon: Lyn Sanders, MD;  Location: [facility];  Service: Gynecology;  Laterality: N/A;    Family History  Problem Relation Age of Onset   Throat cancer Mother        smoker   Throat cancer Father        smoker   Hypertension Father    Cirrhosis Father    Hypertension Other    Cancer Other        smoker   Diabetes Other    Asthma Other     Social History   Socioeconomic History   Marital status: Single    Spouse name: Not on file   Number of children: 4   Years of education: Not on file   Highest education level: Not on file  Occupational History   Occupation: Production designer, theatre/television/film at [employer]  Tobacco Use   Smoking status: Every Day    Current packs/day: 0.10    Average packs/day: 0.1 packs/day for 3.0 years (0.3 ttl pk-yrs)    Types: Cigarettes   Smokeless tobacco: Never   Tobacco comments:    1-2 a day  Vaping Use   Vaping status: Never Used  Substance and Sexual Activity   Alcohol use: Yes  Alcohol/week: 0.0 standard drinks of alcohol    Comment: Ocassionally   Drug use: Yes    Frequency: 2.0 times per week    Types: Marijuana, MDMA (Ecstacy)   Sexual activity: Not Currently    Partners: Male    Birth control/protection: Condom, Surgical    Comment: pt. given condoms  Other Topics Concern   Not on file  Social History Narrative   Not on file   Social Drivers of Health   Financial Resource Strain: Not on file  Food Insecurity: Not on file  Transportation Needs: Not on file  Physical Activity: Not on file  Stress: Not on file  Social Connections: Not on file  Intimate Partner Violence: Not on file     Physical Exam   Vitals:   11/18/23 0412 11/18/23 0647  BP: 107/70   Pulse: 88   Resp: 18   Temp:  97.9 F (36.6 C)  SpO2: 99%     CONSTITUTIONAL: Chronically ill-appearing, NAD NEURO/PSYCH:   Alert and oriented x 3, no focal deficits EYES:  eyes equal and reactive ENT/NECK:  no LAD, no JVD CARDIO: Regular rate, well-perfused, normal S1 and S2 PULM:  CTAB no wheezing or rhonchi GI/GU:  non-distended, non-tender MSK/SPINE:  No gross deformities, no edema SKIN:  no rash, atraumatic   *Additional and/or pertinent findings included in MDM below  Diagnostic and Interventional Summary    EKG Interpretation Date/Time:    Ventricular Rate:    PR Interval:    QRS Duration:    QT Interval:    QTC Calculation:   R Axis:      Text Interpretation:         Labs Reviewed  CBC - Abnormal; Notable for the following components:      Result Value   WBC 2.1 (*)    RBC 3.74 (*)    Hemoglobin 9.6 (*)    HCT 30.7 (*)    MCH 25.7 (*)    RDW 16.3 (*)    All other components within normal limits  COMPREHENSIVE METABOLIC PANEL WITH GFR - Abnormal; Notable for the following components:   Potassium 3.2 (*)    Calcium  8.4 (*)    Total Protein 8.6 (*)    Albumin 3.4 (*)    All other components within normal limits  URINALYSIS, ROUTINE W REFLEX MICROSCOPIC - Abnormal; Notable for the following components:   APPearance TURBID (*)    pH 8.5 (*)    Hgb urine dipstick MODERATE (*)    Protein, ur 100 (*)    Leukocytes,Ua SMALL (*)    All other components within normal limits  URINALYSIS, MICROSCOPIC (REFLEX) - Abnormal; Notable for the following components:   Bacteria, UA MANY (*)    All other components within normal limits  RESP PANEL BY RT-PCR (RSV, FLU A&B, COVID)  RVPGX2  CULTURE, BLOOD (ROUTINE X 2)  CULTURE, BLOOD (ROUTINE X 2)    DG Chest Port 1 View  Final Result      Medications  cephALEXin  (KEFLEX ) capsule 500 mg (has no administration in time range)  sodium chloride  0.9 % bolus 1,000 mL (0 mLs Intravenous Stopped 11/18/23 0634)  ondansetron  (ZOFRAN ) injection 4 mg (4 mg Intravenous Given 11/18/23 0454)  ketorolac  (TORADOL ) 15 MG/ML injection 15 mg (15 mg Intravenous  Given 11/18/23 0454)     Procedures  /  Critical Care Procedures  ED Course and Medical Decision Making  Initial Impression and Ddx With the report of fever at home and  patient's history of a's and medication noncompliance recently there is concern for underlying infectious process.  Past medical/surgical history that increases complexity of ED encounter: AIDS  Interpretation of Diagnostics I personally reviewed the Laboratory Testing and my interpretation is as follows: Leukopenia    Patient Reassessment and Ultimate Disposition/Management     Awaiting urinalysis, patient continues to have normal vital signs, symptoms are well-controlled, seems to be appropriate for discharge after urine results.  Signed out to oncoming provider at shift change.  Patient management required discussion with the following services or consulting groups:  None  Complexity of Problems Addressed Acute illness or injury that poses threat of life of bodily function  Additional Data Reviewed and Analyzed Further history obtained from: Prior labs/imaging results  Additional Factors Impacting ED Encounter Risk None  Merrick Abe. Harless Lien, MD [facility] Emergency Medicine [facility] [email or web address]  Final Clinical Impressions(s) / ED Diagnoses     ICD-10-CM   1. Body aches  R52     2. Acute cystitis without hematuria  N30.00     3. AIDS (acquired immune deficiency syndrome) (HCC)  B20 bictegravir-emtricitabine -tenofovir  AF (BIKTARVY ) 50-200-25 MG TABS tablet      ED Discharge Orders          Ordered    cephALEXin  (KEFLEX ) 500 MG capsule  3 times daily        11/18/23 0703    bictegravir-emtricitabine -tenofovir  AF (BIKTARVY ) 50-200-25 MG TABS tablet  Daily        11/18/23 0703             Discharge Instructions Discussed with and Provided to Patient:     Discharge Instructions      You were evaluated in the Emergency Department and after careful evaluation, we  did not find any emergent condition requiring admission or further testing in the hospital.  Your exam/testing today was overall reassuring.  Symptoms may be due to a UTI.  Take the Keflex  antibiotic as directed.  We are also prescribing your HIV medicine, very important that you restart this.  Follow-up with infectious disease.  Please return to the Emergency Department if you experience any worsening of your condition.  Thank you for allowing us  to be a part of your care.      Edson Graces, MD 11/18/23 0981    Edson Graces, MD 11/18/23 [phone]

## 2023-11-18 NOTE — Discharge Instructions (Signed)
 You were evaluated in the Emergency Department and after careful evaluation, we did not find any emergent condition requiring admission or further testing in the hospital.  Your exam/testing today was overall reassuring.  Symptoms may be due to a UTI.  Take the Keflex  antibiotic as directed.  We are also prescribing your HIV medicine, very important that you restart this.  Follow-up with infectious disease.  Please return to the Emergency Department if you experience any worsening of your condition.  Thank you for allowing us  to be a part of your care.

## 2023-11-18 NOTE — ED Triage Notes (Signed)
 Pt presents via POV c/o generalized body aches and fever starting today. Ambulatory to triage. Reports cold chills. A&O x4. Also report emesis tonight.

## 2023-11-22 ENCOUNTER — Encounter (HOSPITAL_BASED_OUTPATIENT_CLINIC_OR_DEPARTMENT_OTHER): Payer: Self-pay | Admitting: Emergency Medicine

## 2023-11-22 ENCOUNTER — Emergency Department (HOSPITAL_BASED_OUTPATIENT_CLINIC_OR_DEPARTMENT_OTHER)

## 2023-11-22 ENCOUNTER — Emergency Department (HOSPITAL_BASED_OUTPATIENT_CLINIC_OR_DEPARTMENT_OTHER): Admission: EM | Admit: 2023-11-22 | Discharge: 2023-11-22 | Disposition: A | Discharge status: Home / Self Care

## 2023-11-22 ENCOUNTER — Other Ambulatory Visit: Payer: Self-pay

## 2023-11-22 DIAGNOSIS — J45909 Unspecified asthma, uncomplicated: Secondary | ICD-10-CM | POA: Insufficient documentation

## 2023-11-22 DIAGNOSIS — M791 Myalgia, unspecified site: Secondary | ICD-10-CM | POA: Diagnosis not present

## 2023-11-22 DIAGNOSIS — J189 Pneumonia, unspecified organism: Secondary | ICD-10-CM | POA: Insufficient documentation

## 2023-11-22 DIAGNOSIS — Z79899 Other long term (current) drug therapy: Secondary | ICD-10-CM | POA: Insufficient documentation

## 2023-11-22 DIAGNOSIS — R112 Nausea with vomiting, unspecified: Secondary | ICD-10-CM | POA: Insufficient documentation

## 2023-11-22 DIAGNOSIS — R1084 Generalized abdominal pain: Secondary | ICD-10-CM | POA: Insufficient documentation

## 2023-11-22 DIAGNOSIS — R059 Cough, unspecified: Secondary | ICD-10-CM | POA: Diagnosis present

## 2023-11-22 DIAGNOSIS — H9203 Otalgia, bilateral: Secondary | ICD-10-CM | POA: Diagnosis not present

## 2023-11-22 LAB — URINE DRUG SCREEN
Amphetamines: DETECTED — AB
Barbiturates: NOT DETECTED
Benzodiazepines: NOT DETECTED
Cocaine: NOT DETECTED
Fentanyl: NOT DETECTED
Methadone Scn, Ur: NOT DETECTED
Opiates: NOT DETECTED
Tetrahydrocannabinol: DETECTED — AB

## 2023-11-22 LAB — URINALYSIS, ROUTINE W REFLEX MICROSCOPIC
Glucose, UA: NEGATIVE mg/dL
Ketones, ur: NEGATIVE mg/dL
Leukocytes,Ua: NEGATIVE
Nitrite: NEGATIVE
Protein, ur: 100 mg/dL — AB
Specific Gravity, Urine: >=1.03 (ref 1.005–1.030)
pH: 6 (ref 5.0–8.0)

## 2023-11-22 LAB — CBC
HCT: 32.2 % — ABNORMAL LOW (ref 36.0–46.0)
Hemoglobin: 10.2 g/dL — ABNORMAL LOW (ref 12.0–15.0)
MCH: 25.6 pg — ABNORMAL LOW (ref 26.0–34.0)
MCHC: 31.7 g/dL (ref 30.0–36.0)
MCV: 80.7 fL (ref 80.0–100.0)
Platelets: 211 K/uL (ref 150–400)
RBC: 3.99 MIL/uL (ref 3.87–5.11)
RDW: 15.9 % — ABNORMAL HIGH (ref 11.5–15.5)
WBC: 3.6 K/uL — ABNORMAL LOW (ref 4.0–10.5)
nRBC: 0 % (ref 0.0–0.2)

## 2023-11-22 LAB — URINALYSIS, MICROSCOPIC (REFLEX)

## 2023-11-22 LAB — COMPREHENSIVE METABOLIC PANEL WITH GFR
ALT: 12 U/L (ref 0–44)
AST: 27 U/L (ref 15–41)
Albumin: 3.5 g/dL (ref 3.5–5.0)
Alkaline Phosphatase: 116 U/L (ref 38–126)
Anion gap: 12 (ref 5–15)
BUN: 11 mg/dL (ref 6–20)
CO2: 20 mmol/L — ABNORMAL LOW (ref 22–32)
Calcium: 8.4 mg/dL — ABNORMAL LOW (ref 8.9–10.3)
Chloride: 104 mmol/L (ref 98–111)
Creatinine, Ser: 0.56 mg/dL (ref 0.44–1.00)
GFR, Estimated: >60 mL/min (ref >60)
Glucose, Bld: 122 mg/dL — ABNORMAL HIGH (ref 70–99)
Potassium: 3.6 mmol/L (ref 3.5–5.1)
Sodium: 136 mmol/L (ref 135–145)
Total Bilirubin: 0.6 mg/dL (ref 0.0–1.2)
Total Protein: 9 g/dL — ABNORMAL HIGH (ref 6.5–8.1)

## 2023-11-22 LAB — RESP PANEL BY RT-PCR (RSV, FLU A&B, COVID)  RVPGX2
Influenza A by PCR: NEGATIVE
Influenza B by PCR: NEGATIVE
Resp Syncytial Virus by PCR: NEGATIVE
SARS Coronavirus 2 by RT PCR: NEGATIVE

## 2023-11-22 LAB — PREGNANCY, URINE: Preg Test, Ur: NEGATIVE

## 2023-11-22 LAB — LIPASE, BLOOD: Lipase: 18 U/L (ref 11–51)

## 2023-11-22 MED ORDER — KETOROLAC TROMETHAMINE 15 MG/ML IJ SOLN
15.0000 mg | Freq: Once | INTRAMUSCULAR | Status: AC
  Administered 2023-11-22: 15 mg via INTRAVENOUS
  Filled 2023-11-22: qty 1

## 2023-11-22 MED ORDER — AZITHROMYCIN 250 MG PO TABS
250.0000 mg | ORAL_TABLET | Freq: Every day | ORAL | 0 refills | Status: DC
Start: 2023-11-22 — End: 2024-02-12

## 2023-11-22 MED ORDER — IOHEXOL 300 MG/ML  SOLN
100.0000 mL | Freq: Once | INTRAMUSCULAR | Status: AC | PRN
  Administered 2023-11-22: 100 mL via INTRAVENOUS

## 2023-11-22 MED ORDER — PROCHLORPERAZINE EDISYLATE 10 MG/2ML IJ SOLN
10.0000 mg | Freq: Once | INTRAMUSCULAR | Status: AC
  Administered 2023-11-22: 10 mg via INTRAVENOUS
  Filled 2023-11-22: qty 2

## 2023-11-22 MED ORDER — DOXYCYCLINE HYCLATE 100 MG PO TABS: 100.0000 mg | ORAL_TABLET | Freq: Once | ORAL | Status: DC

## 2023-11-22 MED ORDER — DIPHENHYDRAMINE HCL 50 MG/ML IJ SOLN
12.5000 mg | Freq: Once | INTRAMUSCULAR | Status: AC
  Administered 2023-11-22: 12.5 mg via INTRAVENOUS
  Filled 2023-11-22: qty 1

## 2023-11-22 MED ORDER — SULFAMETHOXAZOLE-TRIMETHOPRIM 800-160 MG PO TABS
1.0000 | ORAL_TABLET | Freq: Once | ORAL | Status: AC
  Administered 2023-11-22: 1 via ORAL
  Filled 2023-11-22: qty 1

## 2023-11-22 MED ORDER — AZITHROMYCIN 250 MG PO TABS
1250.0000 mg | ORAL_TABLET | Freq: Once | ORAL | Status: AC
  Administered 2023-11-22: 1250 mg via ORAL
  Filled 2023-11-22: qty 5

## 2023-11-22 MED ORDER — SODIUM CHLORIDE 0.9 % IV BOLUS
1000.0000 mL | Freq: Once | INTRAVENOUS | Status: AC
  Administered 2023-11-22: 1000 mL via INTRAVENOUS

## 2023-11-22 MED ORDER — SULFAMETHOXAZOLE-TRIMETHOPRIM 800-160 MG PO TABS: 1.0000 | ORAL_TABLET | Freq: Two times a day (BID) | ORAL | 0 refills | Status: AC

## 2023-11-22 NOTE — ED Provider Notes (Signed)
 [place] EMERGENCY DEPARTMENT AT [facility] Provider Note   CSN: [id number] Arrival date & time: 11/22/23  1457     History  Chief Complaint  Patient presents with   Emesis   Otalgia   Generalized Body Aches    Beverly [name] is a 43 y.o. female.  Patient with AIDS, depression, Kaposi's sarcoma, asthma presenting to emergency room with complaint of abdominal pain associated with severe nausea and vomiting.  Patient reports that she has not been tolerating oral intake for several days.  Reports she has associated generalized bodyaches.  Notes she has also had cough, productive. Has not been taking antiviral. Denies fever today, but thinks she had a fever yesterday.    Emesis Otalgia Associated symptoms: vomiting        Home Medications Prior to Admission medications   Medication Sig Start Date End Date Taking? Authorizing Provider  acetaminophen  (TYLENOL ) 325 MG tablet Take 2 tablets (650 mg total) by mouth every 6 (six) hours as needed for mild pain, fever or headache. 12/24/22   Abbe Abate, MD  azithromycin  (ZITHROMAX ) 600 MG tablet Take 2 tablets (1,200 mg total) by mouth once a week. 02/16/23   Orson Blalock, NP  bictegravir-emtricitabine -tenofovir  AF (BIKTARVY ) 50-200-25 MG TABS tablet Take 1 tablet by mouth daily. Try to take at the same time each day with or without food. 11/18/23   Albertus Hughs, DO  cephALEXin  (KEFLEX ) 500 MG capsule Take 1 capsule (500 mg total) by mouth 3 (three) times daily for 7 days. 11/18/23 11/25/23  Albertus Hughs, DO  escitalopram  (LEXAPRO ) 5 MG tablet Take 1 tablet (5 mg total) by mouth daily. 12/31/22   Carleen Chary, DO  mirtazapine  (REMERON ) 7.5 MG tablet Take 1 tablet (7.5 mg total) by mouth at bedtime. 12/28/22   Abbe Abate, MD  oxyCODONE  (OXY IR/ROXICODONE ) 5 MG immediate release tablet Take 1 tablet (5 mg total) by mouth every 4 (four) hours as needed for moderate pain. 12/24/22   Abbe Abate, MD  pregabalin   (LYRICA ) 25 MG capsule Take 1 capsule (25 mg total) by mouth 2 (two) times daily. Patient not taking: Reported on 10/19/2022 01/22/22   Orson Blalock, NP  sulfamethoxazole -trimethoprim  (BACTRIM  DS) 800-160 MG tablet Take 1 tablet by mouth daily. 02/16/23   Orson Blalock, NP  vitamin B-12 1000 MCG tablet Take 1 tablet (1,000 mcg total) by mouth daily. Patient not taking: Reported on 10/26/2021 01/28/21   Gonfa, Taye T, MD      Allergies    Bee venom, Ferrlecit  [na ferric gluc cplx in sucrose], and Morphine  and codeine    Review of Systems   Review of Systems  HENT:  Positive for ear pain.   Gastrointestinal:  Positive for vomiting.    Physical Exam Updated Vital Signs BP (!) 124/91   Pulse 96   Temp 98.5 F (36.9 C)   Resp 16   Ht 5\' 1"  (1.549 m)   Wt 68 kg   SpO2 96%   BMI 28.34 kg/m  Physical Exam Vitals and nursing note reviewed.  Constitutional:      General: She is not in acute distress.    Appearance: She is not toxic-appearing.  HENT:     Head: Normocephalic and atraumatic.  Eyes:     General: No scleral icterus.    Conjunctiva/sclera: Conjunctivae normal.  Cardiovascular:     Rate and Rhythm: Regular rhythm. Tachycardia present.     Pulses: Normal pulses.  Heart sounds: Normal heart sounds.  Pulmonary:     Effort: Pulmonary effort is normal. No respiratory distress.     Breath sounds: Normal breath sounds.  Abdominal:     General: Abdomen is flat. Bowel sounds are normal.     Palpations: Abdomen is soft.     Tenderness: There is abdominal tenderness.  Skin:    General: Skin is warm and dry.     Findings: No lesion.  Neurological:     General: No focal deficit present.     Mental Status: She is alert and oriented to person, place, and time. Mental status is at baseline.     ED Results / Procedures / Treatments   Labs (all labs ordered are listed, but only abnormal results are displayed) Labs Reviewed  COMPREHENSIVE METABOLIC PANEL WITH GFR -  Abnormal; Notable for the following components:      Result Value   CO2 20 (*)    Glucose, Bld 122 (*)    Calcium  8.4 (*)    Total Protein 9.0 (*)    All other components within normal limits  CBC - Abnormal; Notable for the following components:   WBC 3.6 (*)    Hemoglobin 10.2 (*)    HCT 32.2 (*)    MCH 25.6 (*)    RDW 15.9 (*)    All other components within normal limits  RESP PANEL BY RT-PCR (RSV, FLU A&B, COVID)  RVPGX2  LIPASE, BLOOD  URINALYSIS, ROUTINE W REFLEX MICROSCOPIC  PREGNANCY, URINE  URINE DRUG SCREEN  T-HELPER CELLS (CD4) COUNT (NOT AT [facility])    EKG EKG Interpretation Date/Time:  Monday November 22 2023 15:16:51 EDT Ventricular Rate:  118 PR Interval:  112 QRS Duration:  84 QT Interval:  299 QTC Calculation: 419 R Axis:   25  Text Interpretation: Sinus tachycardia Biatrial enlargement RSR' in V1 or V2, right VCD or RVH Confirmed by Abner Hoffman [phone]) on 11/22/2023 3:19:32 PM  Radiology No results found.  Procedures Procedures    Medications Ordered in ED Medications  sodium chloride  0.9 % bolus 1,000 mL (has no administration in time range)  prochlorperazine  (COMPAZINE ) injection 10 mg (has no administration in time range)  diphenhydrAMINE  (BENADRYL ) injection 12.5 mg (has no administration in time range)  ketorolac  (TORADOL ) 15 MG/ML injection 15 mg (has no administration in time range)    ED Course/ Medical Decision Making/ A&P Clinical Course as of 11/22/23 1915  Mon Nov 22, 2023  1829 Tetrahydrocannabinol(!): DETECTED [JB]  1831 Reassessed, feeling better after 1L and tachycardia has improved. No longer reporting N and no further episodes of emesis. Will reporting generalized abdominal pain.  [JB]    Clinical Course User Index [JB] Sumaya Riedesel, Kandace Organ, PA-C                                 Medical Decision Making Amount and/or Complexity of Data Reviewed Labs: ordered. Decision-making details documented in ED Course. Radiology:  ordered.  Risk Prescription drug management.   This patient presents to the ED for concern of abdominal pain, this involves an extensive number of treatment options, and is a complaint that carries with it a high risk of complications and morbidity.  The differential diagnosis includes pancreatitis, cholecystitis, gastroenteritis, appendicitis, diverticulitis, viral URI   Co morbidities that complicate the patient evaluation  AIDs   Additional history obtained:  Additional history obtained from send ED visit from which patient  was seen for similar symptoms 11/18/2023.  Patient was diagnosed with cystitis and started on Keflex .   Lab Tests:  I personally interpreted labs.  The pertinent results include:   White blood cell 3.6, hemoglobin 10.2, platelets 200 CMP without anion gap and normal electrolytes. Lipase 18 CD4 count obtained and pending.    Imaging Studies ordered:  I ordered imaging studies including chest x-ray, ct abdomen/pelvis   Pending    Cardiac Monitoring: / EKG:  The patient was maintained on a cardiac monitor.  I personally viewed and interpreted the cardiac monitored which showed an underlying rhythm of: sinus    Problem List / ED Course / Critical interventions / Medication management  Patient coming to emergency room with nausea vomiting abdominal pain.  She is slightly tachycardic but is has improved after 1 L normal saline.  She has not noted any blood in vomit or stool. Has noted cough, but no chest pain or shortness of breath.  He does have generalized tenderness to palpation in all quadrants of the abdomen.  Lungs are clear to auscultation bilaterally.  Plan to get chest x-ray to rule out pneumonia.  Will obtain CT scan to rule out acute abnormality.  Will check labs to make sure she is not showing symptoms.  Dehydration or electrolyte abnormality.  Will obtain CD4 count.  Patient recently had blood cultures drawn at time of recent visit which were  negative.  She is not meeting SIRS criteria today.  Her urinary tract infection has improved in comparison to prior UA. I ordered medication including Compazine , normal saline, Toradol , Benadryl . Reevaluation of the patient after these medicines showed that the patient improved I have reviewed the patients home medicines and have made adjustments as needed Signed off to [facility] at shift change pending chest x-ray and CT of abdomen pelvis.  Anticipate with reassuring scans patient will be stable for discharge home          Final Clinical Impression(s) / ED Diagnoses Final diagnoses:  None    Rx / DC Orders ED Discharge Orders     None         Suzanne Erps Allison Ivory 11/22/23 1918    Carin Charleston, MD 11/22/23 2244

## 2023-11-22 NOTE — ED Triage Notes (Incomplete)
 Pt POV shuffling gait- c/o generalized body aches and pains, productive cough. Dx with UTI on Friday. Poor po intake since being seen last. Fever yesterday. Emesis since Friday.   Also c/o L ear pain today.

## 2023-11-22 NOTE — ED Provider Notes (Signed)
 Productive cough and some nausea and vomiting. History of AIDS. Follow up on CXR. PO trial Physical Exam  BP 107/76   Pulse 85   Temp 98.2 F (36.8 C) (Oral)   Resp 17   Ht 5\' 1"  (1.549 m)   Wt 68 kg   LMP  (LMP Unknown)   SpO2 98%   BMI 28.34 kg/m   Physical Exam Vitals and nursing note reviewed.  Constitutional:      Appearance: Normal appearance.     Comments: Tired appearing. No active vomiting. Tolerating ginger ale   HENT:     Head: Atraumatic.     Mouth/Throat:     Mouth: Mucous membranes are moist.     Pharynx: No oropharyngeal exudate.     Comments: No thrush Cardiovascular:     Rate and Rhythm: Normal rate and regular rhythm.  Pulmonary:     Effort: Pulmonary effort is normal.  Neurological:     General: No focal deficit present.     Mental Status: She is alert.  Psychiatric:        Mood and Affect: Mood normal.        Behavior: Behavior normal.     Procedures  Procedures  ED Course / MDM   Clinical Course as of 11/22/23 2035  Mon Nov 22, 2023  1829 Tetrahydrocannabinol(!): DETECTED [JB]  1831 Reassessed, feeling better after 1L and tachycardia has improved. No longer reporting N and no further episodes of emesis. Will reporting generalized abdominal pain.  [JB]    Clinical Course User Index [JB] Barrett, Kandace Organ, PA-C   Medical Decision Making Amount and/or Complexity of Data Reviewed Labs: ordered. Decision-making details documented in ED Course. Radiology: ordered.  Risk Prescription drug management.   Patient received at signout.  In short, patient presenting with concern for nausea, vomiting, generalized abdominal pain, and productive cough for the past 3 days.  Reports coughing up yellow mucus.  Chest x-ray was unremarkable, but CT of the abdomen pelvis was obtained for further evaluation by previous team.  Nodular airspace infiltrates in the lung bases was noted and thought to be multifocal pneumonia.  Follow-up noncontrast chest CT was  recommended in 3 to 6 months.  Diffuse colonic wall thickening was noted, likely representing colitis.   Regarding patient's nausea and vomiting, this has resolved with the Benadryl  and Compazine  given by previous team.  She is tolerating ginger ale at bedside.  Likely colitis was noted on CT.  However, she has not had any recent travel, no abnormal food intake, no diarrhea, no fevers. Feel this is less likely acute infectious etiology. No signs of infection on urinalysis.  She did test positive for THC, her nausea and vomiting likely is related to cannabinoid hyperemesis.  Given productive cough, and likely multifocal pneumonia noted on chest CT, and patient's history of AIDS, infectious disease was consulted. Dr. Levern Reader recommended treating patient with 1200mg  azithromycin  in the ER and continuation of 250mg  at home. Recommended Bactrim  BID daily for PCP.   Patient was given first dose of azithromycin  and Bactrim  here today.  Patient also expressed interest in reestablishing care for treatment of her AIDS.  Dr. Alva Auer will have her office contact patient.  Will also provide patient with Dr. Alva Auer contact information.   Patient discharged home with course of azithromycin  and Bactrim  for treatment of multifocal pneumonia.  Will have her follow-up with infectious disease Dr. Patrick Boor regarding further management of her AIDS and repeat chest CT.  Their contact information  is provided.  Discussed with patient that she will need to have a follow-up CT scan performed in 3 to 6 months for reevaluation of the lung nodules noted on her CT today.  Zofran  as needed for nausea and vomiting at home.  Return precautions given.    Rexie Catena, PA-C 11/22/23 2035    Carin Charleston, MD 11/22/23 [phone]

## 2023-11-22 NOTE — Discharge Instructions (Addendum)
 Your CT scan today showed that you likely have a pneumonia.  You are being treated for this with 2 antibiotics. The first antibiotic azithromycin . Please take this once daily for the next 4 days. The second antibiotic is called Bactrim .  Please take this twice daily for the next 21 days.  Please take the full course of antibiotics even if you start feeling better.  You will need to receive a follow-up CT in 3 to 6 months to ensure that the pneumonia on your CT has resolved.  Please make your PCP or infectious disease doctor aware of your CT scan findings so they can schedule this follow-up CT for you. I have included your CT scan results below for your reference.  Please follow up with the infectious disease doctor, Dr. Levern Reader, within the next 2 weeks for further treatment of your AIDS and pneumonia. Their contact information is listed below.  Please call to schedule an appointment.  It also appears you have some inflammation in her colon noted on your CT.  Please eat a bland diet and then advance diet diet as tolerated.  You have been prescribed Zofran  (ondansetron ) for nausea and vomiting. You may take this every 8 hours as needed for nausea and vomiting. This medication dissolves under the tongue. You do not need to swallow it.  Return to the ER for any uncontrolled nausea or vomiting, fevers, shortness of breath, any other new or concerning symptoms  "EXAM: CT ABDOMEN AND PELVIS WITH CONTRAST   TECHNIQUE: Multidetector CT imaging of the abdomen and pelvis was performed using the standard protocol following bolus administration of intravenous contrast.   RADIATION DOSE REDUCTION: This exam was performed according to the departmental dose-optimization program which includes automated exposure control, adjustment of the mA and/or kV according to patient size and/or use of iterative reconstruction technique.   CONTRAST:  OMNIPAQUE  IOHEXOL  300 MG/ML  SOLN   COMPARISON:  12/21/2022    FINDINGS: Lower chest: Patchy nodular airspace infiltrates in both lung bases. These are most likely to represent multifocal pneumonia although developing ground-glass nodules could also have this appearance. Largest distinct nodule measures about 7 mm in diameter.   Hepatobiliary: Mild diffuse fatty infiltration of the liver with focal area of increased fatty infiltration adjacent to the gallbladder fossa. Gallbladder and bile ducts are normal.   Pancreas: Unremarkable. No pancreatic ductal dilatation or surrounding inflammatory changes.   Spleen: Normal in size without focal abnormality.   Adrenals/Urinary Tract: No adrenal gland nodules. Several right intrarenal stones, largest measuring 5 mm in maximal diameter. No hydronephrosis or hydroureter. Nephrograms are symmetrical. Bladder is decompressed.   Stomach/Bowel: Stomach, small bowel, and colon are not abnormally distended. There is mild wall thickening of the colon diffusely. This is likely to represent colitis. Consider infectious or inflammatory etiologies. Appendix is not identified.   Vascular/Lymphatic: No significant vascular findings are present. No enlarged abdominal or pelvic lymph nodes.   Reproductive: Uterus and bilateral adnexa are unremarkable.   Other: No abdominal wall hernia or abnormality. No abdominopelvic ascites.   Musculoskeletal: No acute or significant osseous findings.   IMPRESSION: 1. Nodular airspace infiltrates in the lung bases most likely to represent multifocal pneumonia. Non-contrast chest CT at 3-6 months is recommended. If nodules persist, subsequent management will be based upon the most suspicious nodule(s). This recommendation follows the consensus statement: Guidelines for Management of Incidental Pulmonary Nodules Detected on CT Images: From the Fleischner Society 2017; Radiology 2017; 284:228-243. 2. Multiple nonobstructing stones in the  right kidney. 3. Fatty infiltration  of the liver. 4. Diffuse colonic wall thickening without distention likely representing colitis.     Electronically Signed   By: Boyce Byes M.D.   On: 11/22/2023 18:57:

## 2023-11-23 LAB — CULTURE, BLOOD (ROUTINE X 2)
Culture: NO GROWTH
Culture: NO GROWTH
Special Requests: ADEQUATE
Special Requests: ADEQUATE

## 2023-11-23 LAB — T-HELPER CELLS (CD4) COUNT (NOT AT ARMC)
CD4 % Helper T Cell: <1 % — ABNORMAL LOW (ref 33–65)
CD4 T Cell Abs: <35 /uL — ABNORMAL LOW (ref 400–1790)

## 2023-11-29 ENCOUNTER — Other Ambulatory Visit (HOSPITAL_COMMUNITY): Payer: Self-pay

## 2023-11-29 ENCOUNTER — Other Ambulatory Visit: Payer: Self-pay

## 2023-11-30 ENCOUNTER — Other Ambulatory Visit: Payer: Self-pay

## 2023-12-04 ENCOUNTER — Other Ambulatory Visit (HOSPITAL_COMMUNITY): Payer: Self-pay

## 2024-01-25 ENCOUNTER — Other Ambulatory Visit: Payer: Self-pay

## 2024-01-25 ENCOUNTER — Emergency Department (HOSPITAL_BASED_OUTPATIENT_CLINIC_OR_DEPARTMENT_OTHER)

## 2024-01-25 ENCOUNTER — Emergency Department (HOSPITAL_BASED_OUTPATIENT_CLINIC_OR_DEPARTMENT_OTHER)
Admission: EM | Admit: 2024-01-25 | Discharge: 2024-01-25 | Disposition: A | Discharge status: Home / Self Care | Attending: Emergency Medicine | Admitting: Emergency Medicine

## 2024-01-25 ENCOUNTER — Telehealth: Payer: Self-pay | Admitting: Infectious Disease

## 2024-01-25 ENCOUNTER — Encounter (HOSPITAL_BASED_OUTPATIENT_CLINIC_OR_DEPARTMENT_OTHER): Payer: Self-pay | Admitting: Radiology

## 2024-01-25 DIAGNOSIS — S31109A Unspecified open wound of abdominal wall, unspecified quadrant without penetration into peritoneal cavity, initial encounter: Secondary | ICD-10-CM | POA: Diagnosis present

## 2024-01-25 DIAGNOSIS — R531 Weakness: Secondary | ICD-10-CM | POA: Insufficient documentation

## 2024-01-25 DIAGNOSIS — X58XXXA Exposure to other specified factors, initial encounter: Secondary | ICD-10-CM | POA: Diagnosis not present

## 2024-01-25 DIAGNOSIS — R112 Nausea with vomiting, unspecified: Secondary | ICD-10-CM | POA: Diagnosis not present

## 2024-01-25 DIAGNOSIS — B20 Human immunodeficiency virus [HIV] disease: Secondary | ICD-10-CM | POA: Diagnosis not present

## 2024-01-25 DIAGNOSIS — F17210 Nicotine dependence, cigarettes, uncomplicated: Secondary | ICD-10-CM | POA: Diagnosis not present

## 2024-01-25 DIAGNOSIS — R29898 Other symptoms and signs involving the musculoskeletal system: Secondary | ICD-10-CM

## 2024-01-25 DIAGNOSIS — J45909 Unspecified asthma, uncomplicated: Secondary | ICD-10-CM | POA: Insufficient documentation

## 2024-01-25 DIAGNOSIS — R197 Diarrhea, unspecified: Secondary | ICD-10-CM

## 2024-01-25 LAB — CBC WITH DIFFERENTIAL/PLATELET
Abs Granulocyte: 0.4 K/uL — CL (ref 1.5–6.5)
Abs Immature Granulocytes: 0.01 K/uL (ref 0.00–0.07)
Basophils Absolute: 0 K/uL (ref 0.0–0.1)
Basophils Relative: 1 %
Eosinophils Absolute: 0.1 K/uL (ref 0.0–0.5)
Eosinophils Relative: 7 %
HCT: 30.8 % — ABNORMAL LOW (ref 36.0–46.0)
Hemoglobin: 9.7 g/dL — ABNORMAL LOW (ref 12.0–15.0)
Immature Granulocytes: 1 %
Lymphocytes Relative: 29 %
Lymphs Abs: 0.4 K/uL — ABNORMAL LOW (ref 0.7–4.0)
MCH: 25.1 pg — ABNORMAL LOW (ref 26.0–34.0)
MCHC: 31.5 g/dL (ref 30.0–36.0)
MCV: 79.8 fL — ABNORMAL LOW (ref 80.0–100.0)
Monocytes Absolute: 0.3 K/uL (ref 0.1–1.0)
Monocytes Relative: 27 %
Neutro Abs: 0.4 K/uL — CL (ref 1.7–7.7)
Neutrophils Relative %: 35 %
Platelets: 150 K/uL (ref 150–400)
RBC: 3.86 MIL/uL — ABNORMAL LOW (ref 3.87–5.11)
RDW: 15.7 % — ABNORMAL HIGH (ref 11.5–15.5)
Smear Review: NORMAL
WBC: 1.3 K/uL — CL (ref 4.0–10.5)
nRBC: 0 % (ref 0.0–0.2)

## 2024-01-25 LAB — COMPREHENSIVE METABOLIC PANEL WITH GFR
ALT: 14 U/L (ref 0–44)
AST: 42 U/L — ABNORMAL HIGH (ref 15–41)
Albumin: 3.4 g/dL — ABNORMAL LOW (ref 3.5–5.0)
Alkaline Phosphatase: 139 U/L — ABNORMAL HIGH (ref 38–126)
Anion gap: 12 (ref 5–15)
BUN: 18 mg/dL (ref 6–20)
CO2: 22 mmol/L (ref 22–32)
Calcium: 8.5 mg/dL — ABNORMAL LOW (ref 8.9–10.3)
Chloride: 102 mmol/L (ref 98–111)
Creatinine, Ser: 0.87 mg/dL (ref 0.44–1.00)
GFR, Estimated: >60 mL/min (ref >60)
Glucose, Bld: 112 mg/dL — ABNORMAL HIGH (ref 70–99)
Potassium: 3.6 mmol/L (ref 3.5–5.1)
Sodium: 136 mmol/L (ref 135–145)
Total Bilirubin: 0.6 mg/dL (ref 0.0–1.2)
Total Protein: 9.3 g/dL — ABNORMAL HIGH (ref 6.5–8.1)

## 2024-01-25 LAB — CK: Total CK: 38 U/L (ref 38–234)

## 2024-01-25 LAB — CBG MONITORING, ED: Glucose-Capillary: 68 mg/dL — ABNORMAL LOW (ref 70–99)

## 2024-01-25 LAB — MAGNESIUM: Magnesium: 1.8 mg/dL (ref 1.7–2.4)

## 2024-01-25 MED ORDER — ONDANSETRON HCL 4 MG/2ML IJ SOLN
4.0000 mg | Freq: Once | INTRAMUSCULAR | Status: AC
  Administered 2024-01-25: 4 mg via INTRAVENOUS
  Filled 2024-01-25: qty 2

## 2024-01-25 MED ORDER — HYDROMORPHONE HCL 1 MG/ML IJ SOLN
0.5000 mg | Freq: Once | INTRAMUSCULAR | Status: AC
  Administered 2024-01-25: 0.5 mg via INTRAVENOUS
  Filled 2024-01-25: qty 1

## 2024-01-25 MED ORDER — SODIUM CHLORIDE 0.9 % IV BOLUS
1000.0000 mL | Freq: Once | INTRAVENOUS | Status: AC
  Administered 2024-01-25: 1000 mL via INTRAVENOUS

## 2024-01-25 MED ORDER — IOHEXOL 300 MG/ML  SOLN
100.0000 mL | Freq: Once | INTRAMUSCULAR | Status: AC | PRN
  Administered 2024-01-25: 100 mL via INTRAVENOUS

## 2024-01-25 NOTE — Telephone Encounter (Signed)
 I was contacted by ER and med [facility] I believe with regards to this patient who is being nonadherent to antiretroviral therapy not living in the most stable housing situation who came in with abdominal wounds and worsening leukopenia.  Promised to make sure that we got her connected to clinic and she supposedly has a functioning cell phone.  She also might be a person for whom the CROWN study might be ideal, particularly if she was randomized to the Cabenuva arm from the beginning vs switching over in 6 months  We could provide paid trasnportation she would receive payment for her visits and her ARV if Cabenuva would be given in research unit

## 2024-01-25 NOTE — ED Notes (Signed)
 ED Provider at bedside.

## 2024-01-25 NOTE — ED Provider Notes (Signed)
 Signout from Dr. Elnor.  43 year old female history of HIV asthma anxiety homelessness here with abdominal pain nausea vomiting diarrhea x 3 to 4 days.  Chronic wound in abdomen.  Episode of weakness in her legs yesterday associated with numbness that lasted about 5 minutes.  Since then able to ambulate but has cramping.  She is pending completion of labs and imaging Physical Exam  BP 101/86 (BP Location: Right Arm)   Pulse (!) 116   Temp 98.9 F (37.2 C)   Resp 18   Ht 5' 1 (1.549 m)   SpO2 100%   BMI 28.34 kg/m   Physical Exam  Procedures  Procedures  ED Course / MDM    Medical Decision Making Amount and/or Complexity of Data Reviewed Labs: ordered. Radiology: ordered.  Risk Prescription drug management.  4:45 PM.  [name] CTs do not show any acute findings.  I reviewed this with her.  She tells me she has been off medicines for months and is not in a stable housing situation.  She is interested in getting back on medications though.  I reached out to infectious disease Dr. Lindia and he was going to take her information so he could have the clinic reconnect with her.  It sounds like there is another study that she may be eligible for that would help with transportation and cost.        Beverly Ozell BROCKS, MD 01/26/24 [phone]

## 2024-01-25 NOTE — ED Notes (Signed)
 PT was given food and drink.  She is also aware of then need for a urine collection and stool sample as well.  Pt notes she is unable to do either at this time, and will notify staff when able

## 2024-01-25 NOTE — ED Notes (Signed)
 Per EDP pts open wound was dressed with a xeroform based dressing and then placed a ABD pad over top to add additional support.  Pt laying supine in bed at this time

## 2024-01-25 NOTE — ED Notes (Signed)
 Patient transported to CT

## 2024-01-25 NOTE — ED Notes (Signed)
 Pt ambulated to restroom independently. She notes improvement over all.  Pt is slow moving and notes only due to exsisting wound that was addressed here.  PO chall also tolerated no issues.

## 2024-01-25 NOTE — ED Triage Notes (Signed)
 Pt c/o emesis, diarrhea, poor po intake x 4 days.  Reports increased urinary frequency.   Reports generalized weakness, new sudden onset BLLE numbness last night while walking. Reports falling to ground, couldn't feel legs for 6 minutes went home to take a muscle relaxer to help with the shooting pain in legs. Pt ambulatory with shuffling gait, no assistance, into triage.   Denies known fever, known sick contacts.  Reports increased anxiety and stress, recent family loss.

## 2024-01-25 NOTE — ED Provider Notes (Signed)
 [facility] Provider Note  CSN: [id number] Arrival date & time: 01/25/24 1401  Chief Complaint(s) Weakness, Emesis, and Diarrhea  HPI Beverly [name] is a 43 y.o. female with past medical history as below, significant for AIDS MDD, asthma, anxiety, homeless who presents to the ED with complaint of abdominal pain, nausea vomiting diarrhea, abdominal wound, leg weakness  Patient reports has been having abdominal cramping, nausea vomiting diarrhea over the past 3 to 4 days, poor p.o. intake secondary to nausea.  Emesis nonbloody nonbilious.  No melena, no BRBPR.  Generalized abdominal cramping.  She is also experienced worsening to a chronic wound to her lower abdomen, she is worried that is infected, worried that there is some malodorous fluid leaking from the wound.  She also reports yesterday she felt like her legs were weak and they were numb transiently.  Sensation lasted for around 5 minutes, sensation has since returned back to normal.  She has been ambulatory since then without much difficulty.  Reports cramping to her legs bilateral.  Sensation change was symmetric bilateral.  This has not occurred previously.  Past Medical History Past Medical History:  Diagnosis Date   Abscess    AIDS (HCC) 03/19/2015   Anxiety    Asthma    Blood transfusion without reported diagnosis    Depression    HIV infection (HCC)    Homeless 03/19/2015   states stays with family or friends. Does not rent or own a home, but does not live on streets.   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   Nonintractable headache    Generalized lymphadenopathy    Skin rash associated with AIDS (HCC) 08/27/2020   Cellulitis of abdominal wall    HIV disease (HCC)    Weight loss    Nausea & vomiting 05/25/2018   Abdominal wall skin ulcer (HCC) 03/10/2018   Itching 03/10/2018   History of 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/11/2016   Pancytopenia (HCC) 01/09/2016   Non-healing skin lesion 05/06/2015   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 Authorizing Provider  acetaminophen  (TYLENOL ) 325 MG tablet Take 2 tablets (650 mg total) by mouth every 6 (six) hours as needed for mild pain, fever or headache. 12/24/22   Danton Reyes DASEN, MD  azithromycin  (ZITHROMAX ) 250 MG tablet Take 1 tablet (250 mg total) by mouth daily. 11/22/23   Franaszek, Amanda, PA-C  bictegravir-emtricitabine -tenofovir  AF (BIKTARVY ) 50-200-25 MG TABS tablet Take 1 tablet by mouth daily. Try to take at the same time each day with or without food. 11/18/23   Emil Share, DO  escitalopram  (  LEXAPRO ) 5 MG tablet Take 1 tablet (5 mg total) by mouth daily. 12/31/22   Harrie Bruckner, DO  mirtazapine  (REMERON ) 7.5 MG tablet Take 1 tablet (7.5 mg total) by mouth at bedtime. 12/28/22   Danton Reyes DASEN, MD  oxyCODONE  (OXY IR/ROXICODONE ) 5 MG immediate release tablet Take 1 tablet (5 mg total) by mouth every 4 (four) hours as needed for moderate pain. 12/24/22   Danton Reyes DASEN, MD  pregabalin   (LYRICA ) 25 MG capsule Take 1 capsule (25 mg total) by mouth 2 (two) times daily. Patient not taking: Reported on 10/19/2022 01/22/22   Melvenia Corean SAILOR, NP  vitamin B-12 1000 MCG tablet Take 1 tablet (1,000 mcg total) by mouth daily. Patient not taking: Reported on 10/26/2021 01/28/21   Gonfa, Taye T, MD                                                                                                                                    Past Surgical History Past Surgical History:  Procedure Laterality Date   CERVICAL CONIZATION W/BX N/A 04/14/2018   Procedure: COLD KNIFE CONIZATION CERVIX WITH BIOPSY;  Surgeon: Anitra Freddy NOVAK, MD;  Location: [facility];  Service: Gynecology;  Laterality: N/A;   CESAREAN SECTION     DILATION AND CURETTAGE OF UTERUS N/A 04/14/2018   Procedure: ENDOCERVICAL CURETTAGE;  Surgeon: Anitra Freddy NOVAK, MD;  Location: [facility];  Service: Gynecology;  Laterality: N/A;   INCISION AND DRAINAGE ABSCESS Right 01/10/2016   Procedure: INCISION AND DRAINAGE RIGHT BUTTOCK, LEFT LABIAL , EXCISION AND DRAINAGE OF  RIGHT AXILLARY ABSCESS;  Surgeon: Morene Olives, MD;  Location: [facility];  Service: General;  Laterality: Right;   TUBAL LIGATION  2005   VULVA MARYBETH BIOPSY N/A 04/14/2018   Procedure: VULVAR BIOPSIES;  Surgeon: Anitra Freddy NOVAK, MD;  Location: [facility];  Service: Gynecology;  Laterality: N/A;   Family History Family History  Problem Relation Age of Onset   Throat cancer Mother        smoker   Throat cancer Father        smoker   Hypertension Father    Cirrhosis Father    Hypertension Other    Cancer Other        smoker   Diabetes Other    Asthma Other     Social History Social History   Tobacco Use   Smoking status: Every Day    Current packs/day: 0.10    Average packs/day: 0.1 packs/day for 3.0 years (0.3 ttl pk-yrs)    Types: Cigarettes   Smokeless tobacco: Never   Tobacco comments:    1-2 a day   Vaping Use   Vaping status: Never Used  Substance Use Topics   Alcohol use: Yes    Alcohol/week: 0.0 standard drinks of alcohol    Comment: Ocassionally   Drug use: Yes    Frequency: 2.0  times per week    Types: Marijuana, MDMA (Ecstacy)   Allergies Bee venom, Ferrlecit  [na ferric gluc cplx in sucrose], and Morphine  and codeine  Review of Systems A thorough review of systems was obtained and all systems are negative except as noted in the HPI and PMH.   Physical Exam Vital Signs  I have reviewed the triage vital signs BP 101/86 (BP Location: Right Arm)   Pulse (!) 116   Temp 98.9 F (37.2 C)   Resp 18   Ht 5' 1 (1.549 m)   SpO2 100%   BMI 28.34 kg/m  Physical Exam Vitals and nursing note reviewed.  Constitutional:      General: She is not in acute distress.    Appearance: Normal appearance. She is well-developed. She is not ill-appearing.  HENT:     Head: Normocephalic and atraumatic.     Right Ear: External ear normal.     Left Ear: External ear normal.     Nose: Nose normal.     Mouth/Throat:     Mouth: Mucous membranes are moist.  Eyes:     General: No scleral icterus.       Right eye: No discharge.        Left eye: No discharge.  Cardiovascular:     Rate and Rhythm: Normal rate.  Pulmonary:     Effort: Pulmonary effort is normal. No respiratory distress.     Breath sounds: No stridor.  Abdominal:     General: Abdomen is flat. There is no distension.     Palpations: Abdomen is soft.     Tenderness: There is generalized abdominal tenderness. There is no guarding or rebound.   Musculoskeletal:        General: No deformity.     Cervical back: No rigidity.  Skin:    General: Skin is warm and dry.     Coloration: Skin is not cyanotic, jaundiced or pale.  Neurological:     Mental Status: She is alert and oriented to person, place, and time.     GCS: GCS eye subscore is 4. GCS verbal subscore is 5. GCS motor subscore is 6.     Cranial Nerves: Cranial  nerves 2-12 are intact.     Sensory: Sensation is intact.     Motor: Motor function is intact.     Coordination: Coordination is intact.     Comments: LE NVI Strength 5/5 to BLUE/BLLE, equal and symmetric    Psychiatric:        Speech: Speech normal.        Behavior: Behavior normal. Behavior is cooperative.     ED Results and Treatments Labs (all labs ordered are listed, but only abnormal results are displayed) Labs Reviewed  COMPREHENSIVE METABOLIC PANEL WITH GFR  URINALYSIS, ROUTINE W REFLEX MICROSCOPIC  PREGNANCY, URINE  CBC WITH DIFFERENTIAL/PLATELET  MAGNESIUM   CBG MONITORING, ED  Radiology No results found.  Pertinent labs & imaging results that were available during my care of the patient were reviewed by me and considered in my medical decision making (see MDM for details).  Medications Ordered in ED Medications  HYDROmorphone  (DILAUDID ) injection 0.5 mg (has no administration in time range)  ondansetron  (ZOFRAN ) injection 4 mg (has no administration in time range)  sodium chloride  0.9 % bolus 1,000 mL (has no administration in time range)                                                                                                                                     Procedures Procedures  (including critical care time)  Medical Decision Making / ED Course    Medical Decision Making:    LUE SYKORA is a 43 y.o. female with past medical history as below, significant for AIDS MDD, asthma, anxiety, homeless who presents to the ED with complaint of abdominal pain, nausea vomiting diarrhea, abdominal wound, leg weakness. The complaint involves an extensive differential diagnosis and also carries with it a high risk of complications and morbidity.  Serious etiology was considered. Ddx includes but is not limited to: Differential diagnosis  includes but is not exclusive to , ovarian cyst, ovarian torsion, acute appendicitis, urinary tract infection, endometriosis, bowel obstruction, hernia, colitis, renal colic, gastroenteritis, volvulus, metabolic derangement, rhabdomyolysis etc.   Complete initial physical exam performed, notably the patient was in no acute distress, resting comfortably in stretcher.    Reviewed and confirmed nursing documentation for past medical history, family history, social history.  Vital signs reviewed.    Abdominal pain Nausea vomiting diarrhea> - Provide analgesia, antiemetic, IV fluids.  Check labs, check CT  Abdominal wound> - Check labs, CT AP, wound care - wound appears to be gradually worsening, she has had this for > 1 yr. Concern for some malodorous discharge from the wound today on exam  Transient bilateral leg weakness> - Symptoms have since resolved, she has symmetric strength and sensation to her lower extremities at this time.  Ambulatory. No saddle paresthesia or overflow/incontinence.  - Will check CT lumbar.       Handoff to Dr Towana pending labs/imaging and recheck                Additional history obtained: -Additional history obtained from na -External records from outside source obtained and reviewed including: Chart review including previous notes, labs, imaging, consultation notes including  Home medications, prior labs and imaging   Lab Tests: -I ordered, reviewed, and interpreted labs.   The pertinent results include:   Labs Reviewed  COMPREHENSIVE METABOLIC PANEL WITH GFR  URINALYSIS, ROUTINE W REFLEX MICROSCOPIC  PREGNANCY, URINE  CBC WITH DIFFERENTIAL/PLATELET  MAGNESIUM   CBG MONITORING, ED    Notable for labs penidng  EKG   EKG Interpretation Date/Time:    Ventricular Rate:    PR Interval:    QRS  Duration:    QT Interval:    QTC Calculation:   R Axis:      Text Interpretation:           Imaging Studies ordered: I ordered  imaging studies including CTAP CTL I independently visualized the following imaging with scope of interpretation limited to determining acute life threatening conditions related to emergency care; findings noted above I agree with the radiologist interpretation If any imaging was obtained with contrast I closely monitored patient for any possible adverse reaction a/w contrast administration in the emergency department   Medicines ordered and prescription drug management: Meds ordered this encounter  Medications   HYDROmorphone  (DILAUDID ) injection 0.5 mg   ondansetron  (ZOFRAN ) injection 4 mg   sodium chloride  0.9 % bolus 1,000 mL    -I have reviewed the patients home medicines and have made adjustments as needed   Consultations Obtained: na   Cardiac Monitoring: The patient was maintained on a cardiac monitor.  I personally viewed and interpreted the cardiac monitored which showed an underlying rhythm of: sinus tachy Continuous pulse oximetry interpreted by myself, 100% on ra.    Social Determinants of Health:  Diagnosis or treatment significantly limited by social determinants of health: current smoker, THC, ETOH use Counseled patient for approximately 3 minutes regarding smoking cessation. Discussed risks of smoking and how they applied and affected their visit here today. Patient not ready to quit at this time, however will follow up with their primary doctor when they are.   CPT code: 00593: intermediate counseling for smoking cessation     Reevaluation: After the interventions noted above, I reevaluated the patient and found that they have improved  Co morbidities that complicate the patient evaluation  Past Medical History:  Diagnosis Date   Abscess    AIDS (HCC) 03/19/2015   Anxiety    Asthma    Blood transfusion without reported diagnosis    Depression    HIV infection (HCC)    Homeless 03/19/2015   states stays with family or friends. Does not rent or own a  home, but does not live on streets.   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    Final Clinical Impression(s) / ED Diagnoses Final diagnoses:  Open wound of abdominal wall, initial encounter  Nausea vomiting and diarrhea  Weakness of both lower extremities        Elnor Jayson LABOR, DO 01/25/24 1550

## 2024-01-25 NOTE — Discharge Instructions (Addendum)
 You were seen in the emergency department for nausea vomiting diarrhea abdominal pain.  You had lab work and a CAT scan that did not show an obvious explanation for your symptoms.  I consulted infectious disease and the clinic should be calling you to arrange follow-up.  It will be important to get restarted on your medications.  Return to the emergency department if any worsening or concerning symptoms.

## 2024-01-26 NOTE — Telephone Encounter (Signed)
 Called patient to schedule appointment, call could not be completed.   Referring to [facility] Counselor to assist with re-engaging in care.   Tyeasha Ebbs, BSN, RN

## 2024-01-30 LAB — CULTURE, BLOOD (ROUTINE X 2)
Culture: NO GROWTH
Culture: NO GROWTH
Special Requests: ADEQUATE
Special Requests: ADEQUATE

## 2024-02-12 ENCOUNTER — Emergency Department (HOSPITAL_BASED_OUTPATIENT_CLINIC_OR_DEPARTMENT_OTHER)

## 2024-02-12 ENCOUNTER — Encounter (HOSPITAL_BASED_OUTPATIENT_CLINIC_OR_DEPARTMENT_OTHER): Payer: Self-pay

## 2024-02-12 ENCOUNTER — Inpatient Hospital Stay (HOSPITAL_BASED_OUTPATIENT_CLINIC_OR_DEPARTMENT_OTHER)
Admission: EM | Admit: 2024-02-12 | Discharge: 2024-02-17 | DRG: 975 | Disposition: A | Discharge status: Home / Self Care | Attending: Internal Medicine | Admitting: Internal Medicine

## 2024-02-12 ENCOUNTER — Other Ambulatory Visit: Payer: Self-pay

## 2024-02-12 DIAGNOSIS — Z91030 Bee allergy status: Secondary | ICD-10-CM

## 2024-02-12 DIAGNOSIS — Z833 Family history of diabetes mellitus: Secondary | ICD-10-CM | POA: Diagnosis not present

## 2024-02-12 DIAGNOSIS — F419 Anxiety disorder, unspecified: Secondary | ICD-10-CM | POA: Diagnosis present

## 2024-02-12 DIAGNOSIS — C762 Malignant neoplasm of abdomen: Secondary | ICD-10-CM | POA: Diagnosis present

## 2024-02-12 DIAGNOSIS — Z1152 Encounter for screening for COVID-19: Secondary | ICD-10-CM | POA: Diagnosis not present

## 2024-02-12 DIAGNOSIS — L0390 Cellulitis, unspecified: Secondary | ICD-10-CM | POA: Diagnosis not present

## 2024-02-12 DIAGNOSIS — K5730 Diverticulosis of large intestine without perforation or abscess without bleeding: Secondary | ICD-10-CM | POA: Diagnosis present

## 2024-02-12 DIAGNOSIS — Z79899 Other long term (current) drug therapy: Secondary | ICD-10-CM

## 2024-02-12 DIAGNOSIS — D703 Neutropenia due to infection: Secondary | ICD-10-CM | POA: Diagnosis not present

## 2024-02-12 DIAGNOSIS — Z885 Allergy status to narcotic agent status: Secondary | ICD-10-CM | POA: Diagnosis not present

## 2024-02-12 DIAGNOSIS — B20 Human immunodeficiency virus [HIV] disease: Secondary | ICD-10-CM | POA: Diagnosis present

## 2024-02-12 DIAGNOSIS — Z5941 Food insecurity: Secondary | ICD-10-CM | POA: Diagnosis not present

## 2024-02-12 DIAGNOSIS — D72819 Decreased white blood cell count, unspecified: Secondary | ICD-10-CM | POA: Diagnosis present

## 2024-02-12 DIAGNOSIS — N871 Moderate cervical dysplasia: Secondary | ICD-10-CM | POA: Diagnosis present

## 2024-02-12 DIAGNOSIS — S31109A Unspecified open wound of abdominal wall, unspecified quadrant without penetration into peritoneal cavity, initial encounter: Secondary | ICD-10-CM | POA: Diagnosis present

## 2024-02-12 DIAGNOSIS — F32A Depression, unspecified: Secondary | ICD-10-CM | POA: Diagnosis present

## 2024-02-12 DIAGNOSIS — Z8249 Family history of ischemic heart disease and other diseases of the circulatory system: Secondary | ICD-10-CM | POA: Diagnosis not present

## 2024-02-12 DIAGNOSIS — Z5982 Transportation insecurity: Secondary | ICD-10-CM | POA: Diagnosis not present

## 2024-02-12 DIAGNOSIS — R58 Hemorrhage, not elsewhere classified: Secondary | ICD-10-CM | POA: Diagnosis present

## 2024-02-12 DIAGNOSIS — Z808 Family history of malignant neoplasm of other organs or systems: Secondary | ICD-10-CM

## 2024-02-12 DIAGNOSIS — I959 Hypotension, unspecified: Secondary | ICD-10-CM | POA: Diagnosis present

## 2024-02-12 DIAGNOSIS — A419 Sepsis, unspecified organism: Principal | ICD-10-CM | POA: Diagnosis present

## 2024-02-12 DIAGNOSIS — Z5900 Homelessness unspecified: Secondary | ICD-10-CM | POA: Diagnosis not present

## 2024-02-12 DIAGNOSIS — N900 Mild vulvar dysplasia: Secondary | ICD-10-CM | POA: Diagnosis present

## 2024-02-12 DIAGNOSIS — L03818 Cellulitis of other sites: Secondary | ICD-10-CM | POA: Diagnosis present

## 2024-02-12 DIAGNOSIS — D638 Anemia in other chronic diseases classified elsewhere: Secondary | ICD-10-CM | POA: Diagnosis present

## 2024-02-12 DIAGNOSIS — G629 Polyneuropathy, unspecified: Secondary | ICD-10-CM | POA: Diagnosis present

## 2024-02-12 DIAGNOSIS — D630 Anemia in neoplastic disease: Secondary | ICD-10-CM | POA: Diagnosis present

## 2024-02-12 DIAGNOSIS — J45909 Unspecified asthma, uncomplicated: Secondary | ICD-10-CM | POA: Diagnosis present

## 2024-02-12 DIAGNOSIS — D709 Neutropenia, unspecified: Secondary | ICD-10-CM | POA: Diagnosis present

## 2024-02-12 DIAGNOSIS — F418 Other specified anxiety disorders: Secondary | ICD-10-CM | POA: Diagnosis not present

## 2024-02-12 DIAGNOSIS — R5081 Fever presenting with conditions classified elsewhere: Secondary | ICD-10-CM | POA: Diagnosis present

## 2024-02-12 DIAGNOSIS — Z888 Allergy status to other drugs, medicaments and biological substances status: Secondary | ICD-10-CM

## 2024-02-12 DIAGNOSIS — L02211 Cutaneous abscess of abdominal wall: Secondary | ICD-10-CM | POA: Diagnosis present

## 2024-02-12 DIAGNOSIS — F17210 Nicotine dependence, cigarettes, uncomplicated: Secondary | ICD-10-CM | POA: Diagnosis present

## 2024-02-12 DIAGNOSIS — Z825 Family history of asthma and other chronic lower respiratory diseases: Secondary | ICD-10-CM

## 2024-02-12 DIAGNOSIS — D61818 Other pancytopenia: Secondary | ICD-10-CM | POA: Diagnosis present

## 2024-02-12 DIAGNOSIS — L03311 Cellulitis of abdominal wall: Secondary | ICD-10-CM | POA: Diagnosis not present

## 2024-02-12 DIAGNOSIS — Z8589 Personal history of malignant neoplasm of other organs and systems: Secondary | ICD-10-CM

## 2024-02-12 DIAGNOSIS — T148XXA Other injury of unspecified body region, initial encounter: Secondary | ICD-10-CM | POA: Diagnosis present

## 2024-02-12 LAB — CBC WITH DIFFERENTIAL/PLATELET
Abs Immature Granulocytes: 0.04 K/uL (ref 0.00–0.07)
Basophils Absolute: 0 K/uL (ref 0.0–0.1)
Basophils Relative: 1 %
Eosinophils Absolute: 0 K/uL (ref 0.0–0.5)
Eosinophils Relative: 5 %
HCT: 26.5 % — ABNORMAL LOW (ref 36.0–46.0)
Hemoglobin: 8.2 g/dL — ABNORMAL LOW (ref 12.0–15.0)
Immature Granulocytes: 5 %
Lymphocytes Relative: 32 %
Lymphs Abs: 0.3 K/uL — ABNORMAL LOW (ref 0.7–4.0)
MCH: 24.7 pg — ABNORMAL LOW (ref 26.0–34.0)
MCHC: 30.9 g/dL (ref 30.0–36.0)
MCV: 79.8 fL — ABNORMAL LOW (ref 80.0–100.0)
Monocytes Absolute: 0.2 K/uL (ref 0.1–1.0)
Monocytes Relative: 26 %
Neutro Abs: 0.3 K/uL — CL (ref 1.7–7.7)
Neutrophils Relative %: 31 %
Platelets: 93 K/uL — ABNORMAL LOW (ref 150–400)
RBC: 3.32 MIL/uL — ABNORMAL LOW (ref 3.87–5.11)
RDW: 16.3 % — ABNORMAL HIGH (ref 11.5–15.5)
Smear Review: NORMAL
WBC: 0.8 K/uL — CL (ref 4.0–10.5)
nRBC: 0 % (ref 0.0–0.2)

## 2024-02-12 LAB — PROTIME-INR
INR: 1.1 (ref 0.8–1.2)
Prothrombin Time: 14.5 s (ref 11.4–15.2)

## 2024-02-12 LAB — COMPREHENSIVE METABOLIC PANEL WITH GFR
ALT: 11 U/L (ref 0–44)
AST: 40 U/L (ref 15–41)
Albumin: 2.9 g/dL — ABNORMAL LOW (ref 3.5–5.0)
Alkaline Phosphatase: 116 U/L (ref 38–126)
Anion gap: 11 (ref 5–15)
BUN: 12 mg/dL (ref 6–20)
CO2: 21 mmol/L — ABNORMAL LOW (ref 22–32)
Calcium: 7.8 mg/dL — ABNORMAL LOW (ref 8.9–10.3)
Chloride: 103 mmol/L (ref 98–111)
Creatinine, Ser: 0.8 mg/dL (ref 0.44–1.00)
GFR, Estimated: >60 mL/min (ref >60)
Glucose, Bld: 98 mg/dL (ref 70–99)
Potassium: 3.5 mmol/L (ref 3.5–5.1)
Sodium: 135 mmol/L (ref 135–145)
Total Bilirubin: 0.7 mg/dL (ref 0.0–1.2)
Total Protein: 8 g/dL (ref 6.5–8.1)

## 2024-02-12 LAB — HCG, SERUM, QUALITATIVE: Preg, Serum: NEGATIVE

## 2024-02-12 LAB — HEMOGLOBIN AND HEMATOCRIT, BLOOD
HCT: 26.1 % — ABNORMAL LOW (ref 36.0–46.0)
HCT: 23.8 % — ABNORMAL LOW (ref 36.0–46.0)
Hemoglobin: 7.4 g/dL — ABNORMAL LOW (ref 12.0–15.0)
Hemoglobin: 7 g/dL — ABNORMAL LOW (ref 12.0–15.0)

## 2024-02-12 LAB — RESP PANEL BY RT-PCR (RSV, FLU A&B, COVID)  RVPGX2
Influenza A by PCR: NEGATIVE
Influenza B by PCR: NEGATIVE
Resp Syncytial Virus by PCR: NEGATIVE
SARS Coronavirus 2 by RT PCR: NEGATIVE

## 2024-02-12 LAB — LACTIC ACID, PLASMA: Lactic Acid, Venous: 1.1 mmol/L (ref 0.5–1.9)

## 2024-02-12 LAB — PHOSPHORUS: Phosphorus: 3 mg/dL (ref 2.5–4.6)

## 2024-02-12 LAB — MAGNESIUM: Magnesium: 1.5 mg/dL — ABNORMAL LOW (ref 1.7–2.4)

## 2024-02-12 MED ORDER — DIPHENHYDRAMINE HCL 50 MG/ML IJ SOLN
12.5000 mg | Freq: Four times a day (QID) | INTRAMUSCULAR | Status: DC | PRN
  Administered 2024-02-12 – 2024-02-16 (×5): 12.5 mg via INTRAVENOUS
  Filled 2024-02-12 (×5): qty 1

## 2024-02-12 MED ORDER — MAGNESIUM SULFATE 2 GM/50ML IV SOLN
2.0000 g | Freq: Once | INTRAVENOUS | Status: AC
  Administered 2024-02-12: 2 g via INTRAVENOUS
  Filled 2024-02-12: qty 50

## 2024-02-12 MED ORDER — VANCOMYCIN HCL IN DEXTROSE 1-5 GM/200ML-% IV SOLN
1000.0000 mg | Freq: Once | INTRAVENOUS | Status: AC
  Administered 2024-02-12: 1000 mg via INTRAVENOUS
  Filled 2024-02-12: qty 200

## 2024-02-12 MED ORDER — ONDANSETRON HCL 4 MG/2ML IJ SOLN
4.0000 mg | Freq: Four times a day (QID) | INTRAMUSCULAR | Status: DC | PRN
  Administered 2024-02-16: 4 mg via INTRAVENOUS
  Filled 2024-02-12: qty 2

## 2024-02-12 MED ORDER — OXYCODONE HCL 5 MG PO TABS
5.0000 mg | ORAL_TABLET | ORAL | Status: DC | PRN
  Administered 2024-02-13 – 2024-02-17 (×6): 5 mg via ORAL
  Filled 2024-02-12 (×9): qty 1

## 2024-02-12 MED ORDER — ACETAMINOPHEN 325 MG PO TABS
650.0000 mg | ORAL_TABLET | Freq: Four times a day (QID) | ORAL | Status: DC | PRN
  Administered 2024-02-12 – 2024-02-13 (×2): 650 mg via ORAL
  Filled 2024-02-12 (×2): qty 2

## 2024-02-12 MED ORDER — SULFAMETHOXAZOLE-TRIMETHOPRIM 400-80 MG PO TABS
1.0000 | ORAL_TABLET | Freq: Every day | ORAL | Status: DC
  Administered 2024-02-13 – 2024-02-17 (×4): 1 via ORAL
  Filled 2024-02-12 (×6): qty 1

## 2024-02-12 MED ORDER — ACETAMINOPHEN 650 MG RE SUPP: 650.0000 mg | Freq: Four times a day (QID) | RECTAL | Status: DC | PRN

## 2024-02-12 MED ORDER — HYDROXYZINE HCL 25 MG PO TABS
25.0000 mg | ORAL_TABLET | Freq: Four times a day (QID) | ORAL | Status: DC | PRN
  Administered 2024-02-13: 25 mg via ORAL
  Filled 2024-02-12 (×2): qty 1

## 2024-02-12 MED ORDER — ONDANSETRON HCL 4 MG PO TABS
4.0000 mg | ORAL_TABLET | Freq: Four times a day (QID) | ORAL | Status: DC | PRN
Start: 2024-02-12 — End: 2024-02-17

## 2024-02-12 MED ORDER — HYDROMORPHONE HCL 1 MG/ML IJ SOLN
0.5000 mg | Freq: Once | INTRAMUSCULAR | Status: AC
  Administered 2024-02-12: 0.5 mg via INTRAVENOUS
  Filled 2024-02-12: qty 1

## 2024-02-12 MED ORDER — LACTATED RINGERS IV BOLUS (SEPSIS)
1000.0000 mL | Freq: Once | INTRAVENOUS | Status: AC
  Administered 2024-02-12: 1000 mL via INTRAVENOUS

## 2024-02-12 MED ORDER — HYDROMORPHONE HCL 1 MG/ML IJ SOLN
0.5000 mg | INTRAMUSCULAR | Status: DC | PRN
  Administered 2024-02-12 – 2024-02-16 (×16): 0.5 mg via INTRAVENOUS
  Filled 2024-02-12 (×17): qty 0.5

## 2024-02-12 MED ORDER — SODIUM CHLORIDE 0.9 % IV SOLN
2.0000 g | Freq: Once | INTRAVENOUS | Status: AC
  Administered 2024-02-12: 2 g via INTRAVENOUS
  Filled 2024-02-12: qty 20

## 2024-02-12 MED ORDER — VANCOMYCIN HCL 1250 MG/250ML IV SOLN
1250.0000 mg | INTRAVENOUS | Status: DC
  Administered 2024-02-13 – 2024-02-16 (×4): 1250 mg via INTRAVENOUS
  Filled 2024-02-12 (×4): qty 250

## 2024-02-12 MED ORDER — SODIUM CHLORIDE 0.9 % IV SOLN
2.0000 g | Freq: Three times a day (TID) | INTRAVENOUS | Status: DC
  Administered 2024-02-12 – 2024-02-16 (×12): 2 g via INTRAVENOUS
  Filled 2024-02-12 (×12): qty 12.5

## 2024-02-12 MED ORDER — IOHEXOL 350 MG/ML SOLN
100.0000 mL | Freq: Once | INTRAVENOUS | Status: AC | PRN
  Administered 2024-02-12: 100 mL via INTRAVENOUS

## 2024-02-12 MED ORDER — LACTATED RINGERS IV SOLN: INTRAVENOUS | Status: DC

## 2024-02-12 NOTE — Progress Notes (Signed)
 Pharmacy Antibiotic Note  Beverly [name] is a 43 y.o. female admitted on 02/12/2024 with bleeding from chronic wound in pannus. Concern for cellulitis/infection of area. No purulent drainage noted, however concern that it is larger in size compared to prior images in chart. Pharmacy has been consulted for vancomycin  and cefepime  dosing.  Plan: -Vancomycin  1000 mg given in the ED prior to transfer; continue with 1250 mg IV q24h -Cefepime  2 g IV q8h  Height: 5' 1 (154.9 cm) Weight: 59 kg (130 lb) IBW/kg (Calculated) : 47.8  Temp (24hrs), Avg:98.6 F (37 C), Min:97.8 F (36.6 C), Max:99.2 F (37.3 C)  Recent Labs  Lab 02/12/24 0650  WBC 0.8*  CREATININE 0.80  LATICACIDVEN 1.1    Estimated Creatinine Clearance: 76.4 mL/min (by C-G formula based on SCr of 0.8 mg/dL).    Allergies  Allergen Reactions   Bee Venom Anaphylaxis, Shortness Of Breath and Swelling   Ferrlecit  [Na Ferric Gluc Cplx In Sucrose] Swelling    Swelling and puffiness in hands and legs after Ferrlecit  250 mg infused over 60 minutes (possibly infusion related reaction)   Morphine  And Codeine Hives and Itching    Just can't take morphine , vicodin is OK    Antimicrobials this admission: Vancomycin  8/23 >> Cefepime  8/23 >> Ceftriaxone  8/23 x 1  Dose adjustments this admission: NA  Microbiology results: 8/23 BCx: pending   Thank you for allowing pharmacy to be a part of this patient's care.  Stefano MARLA Bologna, PharmD, BCPS Clinical Pharmacist 02/12/2024 11:33 AM

## 2024-02-12 NOTE — Plan of Care (Signed)
  Problem: Education: Goal: Knowledge of General Education information will improve return to baseline Description: Including pain rating scale, medication(s)/side effects and non-pharmacologic comfort measures Outcome: Progressing   Problem: Health Behavior/Discharge Planning: Goal: Ability to manage health-related needs will improve Outcome: Progressing   Problem: Clinical Measurements: Goal: Ability to maintain clinical measurements within normal limits will improve Outcome: Progressing Goal: Will remain free from infection Outcome: Progressing Goal: Diagnostic test results will improve Outcome: Progressing Goal: Respiratory complications will improve Outcome: Progressing Goal: Cardiovascular complication will be avoided Outcome: Progressing   Problem: Activity: Goal: Risk for activity intolerance will decrease Outcome: Progressing   Problem: Nutrition: Goal: Adequate nutrition will be maintained Outcome: Progressing   Problem: Coping: Goal: Level of anxiety will decrease Outcome: Progressing   Problem: Elimination: Goal: Will not experience complications related to bowel motility Outcome: Progressing Goal: Will not experience complications related to urinary retention Outcome: Progressing   Problem: Pain Managment: Goal: General experience of comfort will improve and/or be controlled Outcome: Progressing   Problem: Safety: Goal: Ability to remain free from injury will improve Outcome: Progressing   Problem: Skin Integrity: Goal: Risk for impaired skin integrity will decrease Outcome: Progressing   Problem: Clinical Measurements: Goal: Ability to avoid or minimize complications of infection will improve Outcome: Progressing   Problem: Skin Integrity: Goal: Skin integrity will improve Outcome: Progressing

## 2024-02-12 NOTE — Consult Note (Signed)
 WOC Nurse Consult Note: Reason for Consult: abdominal wound Wound type: abscess; infectious  Pressure Injury POA: NA Measurement: see nursing flow sheets Wound bed: subcutaneous  Drainage (amount, consistency, odor) bloody; see nursing flow sheets Periwound:intact  Dressing procedure/placement/frequency: Cleanse with saline, pack with saline moist packing, top with ABD pad, secure with mesh underwear. Change BID General surgery consult pending Re consult if needed, will not follow at this time. Thanks  Phylis [employer]aved [employer], RN,CWOCN, CNS, [employer] [phone]

## 2024-02-12 NOTE — Progress Notes (Signed)
 ED Pharmacy Antibiotic Sign Off An antibiotic consult was received from an ED provider for vancomycin  per pharmacy dosing for wound infection. A chart review was completed to assess appropriateness.   The following one time order(s) were placed:  Vancomycin  1g Iv x 1  Further antibiotic and/or antibiotic pharmacy consults should be ordered by the admitting provider if indicated.   Thank you for allowing pharmacy to be a part of this patient's care.   Dorn Poot, [facility]  Clinical Pharmacist 02/12/24 9:52 AM

## 2024-02-12 NOTE — H&P (Signed)
 History and Physical    Patient: Beverly [name] FMW:[id number] DOB: 1980/11/16 DOA: 02/12/2024 DOS: the patient was seen and examined on 02/12/2024 PCP: Patient, No Pcp Per  Patient coming from: Home  Chief Complaint:  Chief Complaint  Patient presents with   Wound Check   HPI: Beverly [name] is a 43 y.o. female with medical history significant of HIV infection/AIDS not on antiretroviral therapy, anxiety, depression, asthma, active tobacco abuse, homelessness, Kaposi's sarcoma, left lip lesion, HIV neuropathy, chronic lower abdominal wall wound who presented to the emergency department complaining that she was soaked in blood from her lower abdomen wound.  She has been having fevers over the past 2 to 3 days.  She denied rhinorrhea, sore throat, wheezing or hemoptysis.  No chest pain, palpitations, diaphoresis, PND, orthopnea or pitting edema of the lower extremities.  No  nausea, emesis, diarrhea, constipation, melena or hematochezia.  No flank pain, dysuria, frequency or hematuria.  No polyuria, polydipsia, polyphagia or blurred vision.   Lab work: CBC 0 white count 9.2, hemoglobin 10.2 g/dL and platelets 750.  PT was 15.9 INR 1.2.  CMP showed normal electrolytes after calcium  correction.  Glucose 133, BUN 48 and creatinine 1.13 mg/dL.  Total protein is 5.3 and albumin 2.7 g/dL, the rest of the LFTs were normal.  Imaging: CTA GI bleed with no evidence of contrast extravasation into the gastric, duodenum, small bowel or colon lumen.  No acute findings in the abdomen or pelvis.  Left colonic diverticulosis without diverticulitis.  Aortic atherosclerosis.  Urethral diverticulum.   ED course: Initial vital signs were temperature 98 F, pulse 97, respiration 17, BP 80/50 mmHg O2 sat 96% on room air.  The patient received normal saline 2000 mL bolus.  Review of Systems: As mentioned in the history of present illness. All other systems reviewed and are negative.  Past Medical History:  Diagnosis  Date   Abscess    AIDS (HCC) 03/19/2015   Anxiety    Asthma    Blood transfusion without reported diagnosis    Depression    HIV infection (HCC)    Homeless 03/19/2015   states stays with family or friends. Does not rent or own a home, but does not live on streets.   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   Skin ulcer (HCC) 05/06/2015   Past Surgical History:  Procedure Laterality Date   CERVICAL CONIZATION W/BX N/A 04/14/2018   Procedure: COLD KNIFE CONIZATION CERVIX WITH BIOPSY;  Surgeon: Anitra Freddy NOVAK, MD;  Location: [facility];  Service: Gynecology;  Laterality: N/A;   CESAREAN SECTION     DILATION AND CURETTAGE OF UTERUS N/A 04/14/2018   Procedure: ENDOCERVICAL CURETTAGE;  Surgeon: Anitra Freddy NOVAK, MD;  Location: [facility];  Service: Gynecology;  Laterality: N/A;   INCISION AND DRAINAGE ABSCESS Right 01/10/2016   Procedure: INCISION AND DRAINAGE RIGHT BUTTOCK, LEFT LABIAL , EXCISION AND DRAINAGE OF  RIGHT AXILLARY ABSCESS;  Surgeon: Morene Olives, MD;  Location: [facility];  Service: General;  Laterality: Right;   TUBAL LIGATION  2005   VULVA MARYBETH BIOPSY N/A 04/14/2018   Procedure: VULVAR BIOPSIES;  Surgeon: Anitra Freddy NOVAK, MD;  Location: [facility];  Service: Gynecology;  Laterality: N/A;   Social History:  reports that she has been smoking cigarettes. She has a 0.3 pack-year smoking history. She  has never used smokeless tobacco. She reports current alcohol use. She reports current drug use. Frequency: 2.00 times per week. Drugs: Marijuana and MDMA (Ecstacy).  Allergies  Allergen Reactions   Bee Venom Anaphylaxis, Shortness Of Breath and Swelling   Ferrlecit  [Na Ferric Gluc Cplx In Sucrose] Swelling    Swelling and puffiness in hands and legs after Ferrlecit  250 mg infused over 60 minutes (possibly  infusion related reaction)   Morphine  And Codeine Hives and Itching    Just can't take morphine , vicodin is OK    Family History  Problem Relation Age of Onset   Throat cancer Mother        smoker   Throat cancer Father        smoker   Hypertension Father    Cirrhosis Father    Hypertension Other    Cancer Other        smoker   Diabetes Other    Asthma Other     Prior to Admission medications   Medication Sig Start Date End Date Taking? Authorizing Provider  acetaminophen  (TYLENOL ) 325 MG tablet Take 2 tablets (650 mg total) by mouth every 6 (six) hours as needed for mild pain, fever or headache. 12/24/22   Danton Reyes DASEN, MD  azithromycin  (ZITHROMAX ) 250 MG tablet Take 1 tablet (250 mg total) by mouth daily. 11/22/23   Veta Palma, PA-C  bictegravir-emtricitabine -tenofovir  AF (BIKTARVY ) 50-200-25 MG TABS tablet Take 1 tablet by mouth daily. Try to take at the same time each day with or without food. 11/18/23   Emil Share, DO  escitalopram  (LEXAPRO ) 5 MG tablet Take 1 tablet (5 mg total) by mouth daily. 12/31/22   Harrie Bruckner, DO  mirtazapine  (REMERON ) 7.5 MG tablet Take 1 tablet (7.5 mg total) by mouth at bedtime. 12/28/22   Danton Reyes DASEN, MD  oxyCODONE  (OXY IR/ROXICODONE ) 5 MG immediate release tablet Take 1 tablet (5 mg total) by mouth every 4 (four) hours as needed for moderate pain. 12/24/22   Danton Reyes DASEN, MD  pregabalin  (LYRICA ) 25 MG capsule Take 1 capsule (25 mg total) by mouth 2 (two) times daily. Patient not taking: Reported on 10/19/2022 01/22/22   Melvenia Corean SAILOR, NP  vitamin B-12 1000 MCG tablet Take 1 tablet (1,000 mcg total) by mouth daily. Patient not taking: Reported on 10/26/2021 01/28/21   Kathrin Mignon DASEN, MD    Physical Exam: Vitals:   02/12/24 0630 02/12/24 0730 02/12/24 0815 02/12/24 0941  BP: 116/73 104/74 110/72 106/74  Pulse: (!) 110 90 96 77  Resp: (!) 22 20 (!) 21 16  Temp:  99.2 F (37.3 C)  98.2 F (36.8 C)  TempSrc:    Oral   SpO2: 100% 99% 100% 100%  Weight:      Height:       Physical Exam Vitals and nursing note reviewed.  Constitutional:      Appearance: Normal appearance. She is ill-appearing.  HENT:     Head: Normocephalic.     Nose: No rhinorrhea.     Mouth/Throat:     Mouth: Mucous membranes are moist.  Eyes:     General: No scleral icterus.    Pupils: Pupils are equal, round, and reactive to light.  Cardiovascular:     Rate and Rhythm: Normal rate and regular rhythm.  Pulmonary:     Effort: Pulmonary effort is normal.     Breath sounds: Normal breath sounds. No wheezing, rhonchi or rales.  Abdominal:     General: Bowel  sounds are normal. There is no distension.     Palpations: Abdomen is soft.     Tenderness: There is abdominal tenderness. There is no right CVA tenderness or left CVA tenderness.  Musculoskeletal:     Cervical back: Neck supple.     Right lower leg: No edema.     Left lower leg: No edema.  Skin:    General: Skin is warm and dry.     Comments: Large suprapubic wound with clotted blood and mild surrounding erythema.  See picture below.  Neurological:     General: No focal deficit present.     Mental Status: She is alert and oriented to person, place, and time.  Psychiatric:        Mood and Affect: Mood normal.        Behavior: Behavior normal.      Data Reviewed:  Results are pending, will review when available.  Assessment and Plan: Principal Problem:   Sepsis due to cellulitis (HCC) Complicated by:   Bleeding from wound Admit to MedSurg/inpatient. Continue cefepime  2 g every 8 hours.   Continue vancomycin  per pharmacy. Follow-up blood culture and sensitivity Monitor hematocrit and hemoglobin. Transfuse as needed. Follow CBC and CMP in a.m. General Surgery consulted.  Active Problems:   Pancytopenia (HCC) In the setting of HIV disease. Will touch base with infectious diseases.    AIDS (acquired immune deficiency syndrome) (HCC) Not on  antiretrovirals. Compliance might be an issue given homelessness.    Hypomagnesemia Denied alcohol use. Magnesium  sulfate 2 g IVPB ordered.    Anxiety and depression No SI or HI. Currently not on therapy.     Advance Care Planning:   Code Status: Full Code   Consults:   Family Communication:   Severity of Illness: The appropriate patient status for this patient is INPATIENT. Inpatient status is judged to be reasonable and necessary in order to provide the required intensity of service to ensure the patient's safety. The patient's presenting symptoms, physical exam findings, and initial radiographic and laboratory data in the context of their chronic comorbidities is felt to place them at high risk for further clinical deterioration. Furthermore, it is not anticipated that the patient will be medically stable for discharge from the hospital within 2 midnights of admission.   * I certify that at the point of admission it is my clinical judgment that the patient will require inpatient hospital care spanning beyond 2 midnights from the point of admission due to high intensity of service, high risk for further deterioration and high frequency of surveillance required.*  Author: Alm Dorn Castor, MD 02/12/2024 11:12 AM  For on call review [email or web address].   This document was prepared using Dragon voice recognition software and may contain some unintended transcription errors.

## 2024-02-12 NOTE — ED Provider Notes (Signed)
 Emergency Department Provider Note  TRIAGE NOTE: Reports rolling over this morning and feeling something wet. When she turned the lights on she saw blood from a baseball sized suprapubic abscess.   AIDS. Reports fevers ~101 at home. No pior to arrival meds.   HISTORY  Chief Complaint Wound Check   HPI Beverly [name] is a 43 y.o. female with  sudden, profuse bleeding from a chronic abdominal wound that has been draining intermittently for weeks. The bleeding began abruptly this morning after the patient had cleaned the wound at home; she reports that the blood shot everywhere and could not be stopped with gauze or a rag. sHe has never had a similar episode of bleeding. The wound is located on the abdomen around waist line and has a history of abscess formation; prior imaging and photographs were taken, but no prior bleeding was noted. The patient reports severe pain at the wound site, which she describes as hurting too bad. sHe denies any fever, chills, or systemic symptoms. He has not taken any medications for the wound since last visit a couple of weeks ago, and she is not on any anticoagulants. she has a history of HIV/AIDs and patient has an upcoming infectious disease follow-up appointment that he has not yet attended. History was obtained from the patient.  PMH Past Medical History:  Diagnosis Date   Abscess    AIDS (HCC) 03/19/2015   Anxiety    Asthma    Blood transfusion without reported diagnosis    Depression    HIV infection (HCC)    Homeless 03/19/2015   states stays with family or friends. Does not rent or own a home, but does not live on streets.   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 Authorizing Provider   acetaminophen  (TYLENOL ) 325 MG tablet Take 2 tablets (650 mg total) by mouth every 6 (six) hours as needed for mild pain, fever or headache. 12/24/22  Yes Danton Reyes DASEN, MD    Social History Social History   Tobacco Use   Smoking status: Every Day    Current packs/day: 0.10    Average packs/day: 0.1 packs/day for 3.0 years (0.3 ttl pk-yrs)    Types: Cigarettes   Smokeless tobacco: Never   Tobacco comments:    1-2 a day  Vaping Use   Vaping status: Never Used  Substance Use Topics   Alcohol use: Yes    Alcohol/week: 0.0 standard drinks of alcohol    Comment: Ocassionally   Drug use: Yes    Frequency: 2.0 times per week    Types: Marijuana, MDMA (Ecstacy)    Review of Systems: Documented in HPI ____________________________________________  PHYSICAL EXAM: VITAL SIGNS: Triage: Blood pressure 101/70, pulse 87, temperature 98.3 F (36.8 C), resp. rate 18, height 5' 1 (1.549 m), weight 59 kg, SpO2 100%.  Vitals:   02/12/24 1118 02/12/24 1722 02/12/24 1949 02/12/24 2324  BP: 126/84 120/85 112/72 101/70  Pulse: 74 (!) 110 100 87  Resp: 18 18 18 18   Temp: 97.8 F (36.6 C) 98.6 F (37 C) (!) 101.3 F (38.5 C) 98.3 F (36.8 C)  TempSrc:   Oral   SpO2: 99% 100% 95% 100%  Weight:  Height:        Physical Exam Vitals and nursing note reviewed.  Constitutional:      Appearance: She is well-developed.  HENT:     Head: Normocephalic and atraumatic.  Cardiovascular:     Rate and Rhythm: Normal rate and regular rhythm.  Pulmonary:     Effort: No respiratory distress.     Breath sounds: No stridor.  Abdominal:     General: There is no distension.  Musculoskeletal:     Cervical back: Normal range of motion.  Skin:    Comments: Large deep abdominal wound at waistline below pannus with dried blood and coagulated blood soaking a washcloth  Neurological:     General: No focal deficit present.     Mental Status: She is alert.        ____________________________________________   LABS (all labs ordered are listed, but only abnormal results are displayed)  Labs Reviewed  COMPREHENSIVE METABOLIC PANEL WITH GFR - Abnormal; Notable for the following components:      Result Value   CO2 21 (*)    Calcium  7.8 (*)    Albumin 2.9 (*)    All other components within normal limits  CBC WITH DIFFERENTIAL/PLATELET - Abnormal; Notable for the following components:   WBC 0.8 (*)    RBC 3.32 (*)    Hemoglobin 8.2 (*)    HCT 26.5 (*)    MCV 79.8 (*)    MCH 24.7 (*)    RDW 16.3 (*)    Platelets 93 (*)    Neutro Abs 0.3 (*)    Lymphs Abs 0.3 (*)    All other components within normal limits  MAGNESIUM  - Abnormal; Notable for the following components:   Magnesium  1.5 (*)    All other components within normal limits  HEMOGLOBIN AND HEMATOCRIT, BLOOD - Abnormal; Notable for the following components:   Hemoglobin 7.4 (*)    HCT 26.1 (*)    All other components within normal limits  HEMOGLOBIN AND HEMATOCRIT, BLOOD - Abnormal; Notable for the following components:   Hemoglobin 7.0 (*)    HCT 23.8 (*)    All other components within normal limits  RESP PANEL BY RT-PCR (RSV, FLU A&B, COVID)  RVPGX2  CULTURE, BLOOD (ROUTINE X 2)  CULTURE, BLOOD (ROUTINE X 2)  LACTIC ACID, PLASMA  PROTIME-INR  HCG, SERUM, QUALITATIVE  PHOSPHORUS  COMPREHENSIVE METABOLIC PANEL WITH GFR  CBC  TYPE AND SCREEN   ____________________________________________  EKG   EKG Interpretation Date/Time:  Saturday February 12 2024 07:18:53 EDT Ventricular Rate:  86 PR Interval:  125 QRS Duration:  87 QT Interval:  376 QTC Calculation: 450 R Axis:   49  Text Interpretation: Sinus rhythm Confirmed by Neysa Clap [phone]) on 02/12/2024 8:18:58 AM        ____________________________________________  RADIOLOGY  CT Angio Abd/Pel W and/or Wo Contrast Result Date: 02/12/2024 CLINICAL DATA:  Bleeding wound under the pannus of the suprapubic  area. EXAM: CTA ABDOMEN AND PELVIS WITHOUT AND WITH CONTRAST TECHNIQUE: Multidetector CT imaging of the abdomen and pelvis was performed using the standard protocol during bolus administration of intravenous contrast. Multiplanar reconstructed images and MIPs were obtained and reviewed to evaluate the vascular anatomy. RADIATION DOSE REDUCTION: This exam was performed according to the departmental dose-optimization program which includes automated exposure control, adjustment of the mA and/or kV according to patient size and/or use of iterative reconstruction technique. CONTRAST:  OMNIPAQUE  IOHEXOL  350 MG/ML SOLN COMPARISON:  None Available. FINDINGS:  VASCULAR Aorta: Normal caliber aorta without aneurysm, dissection, vasculitis or significant stenosis. Celiac: Choose 1 SMA: Choose 1 Renals: Both renal arteries are patent without evidence of aneurysm, dissection, vasculitis, fibromuscular dysplasia or significant stenosis. Accessory right renal artery evident. 2 accessory renal arteries are seen on the left. IMA: Patent without evidence of aneurysm, dissection, vasculitis or significant stenosis. Inflow: Patent without evidence of aneurysm, dissection, vasculitis or significant stenosis. Proximal Outflow: Bilateral common femoral and visualized portions of the superficial and profunda femoral arteries are patent without evidence of aneurysm, dissection, vasculitis or significant stenosis. Veins: No obvious venous abnormality within the limitations of this arterial phase study. Review of the MIP images confirms the above findings. NON-VASCULAR Lower chest: No acute findings. Hepatobiliary: No suspicious focal abnormality within the liver parenchyma. Small area of low attenuation in the anterior liver, adjacent to the falciform ligament, is in a characteristic location for focal fatty deposition. There is no evidence for gallstones, gallbladder wall thickening, or pericholecystic fluid. No intrahepatic or  extrahepatic biliary dilation. Pancreas: No focal mass lesion. No dilatation of the main duct. No intraparenchymal cyst. No peripancreatic edema. Spleen: No splenomegaly. No suspicious focal mass lesion. Adrenals/Urinary Tract: No adrenal nodule or mass. Tiny nonobstructing renal stones bilaterally. No evidence for hydroureter. The urinary bladder appears normal for the degree of distention. Urethral diverticulum evident. Stomach/Bowel: Stomach is unremarkable. No gastric wall thickening. No evidence of outlet obstruction. Duodenum is normally positioned as is the ligament of Treitz. No small bowel wall thickening. No small bowel dilatation. The terminal ileum is normal. The appendix is not well visualized, but there is no edema or inflammation in the region of the cecal tip to suggest appendicitis. No gross colonic mass. No colonic wall thickening. Lymphatic: No abdominal lymphadenopathy. Upper normal groin lymph nodes are seen bilaterally. Reproductive: The uterus is unremarkable.  There is no adnexal mass. Other: No substantial intraperitoneal free fluid. Musculoskeletal: Low anterior abdominal wall wound again identified. Tiny curvilinear hyperattenuating focus identified along the right margin of the wound (see axial 494/series 10). This persists on the venous imaging but has slightly different configuration without accumulation. Small focus of active arterial phase hemorrhage at this location is not excluded. No discernible feeding vessel. No pre contrast imaging to exclude that this represents a radiopaque foreign body in the wound. No worrisome lytic or sclerotic osseous abnormality. IMPRESSION: 1. Low anterior abdominal wall wound again identified. Tiny curvilinear hyperattenuating focus identified along the right margin of the wound. Small focus of active arterial phase hemorrhage at this location is not excluded. This persists on the venous imaging with slightly different configuration but no accumulation  of high density to confirm ongoing bleeding. No discernible feeding vessel. No pre contrast imaging to exclude that this represents a radiopaque foreign body in the wound. 2. Otherwise, no acute findings in the abdomen or pelvis. 3. Tiny nonobstructing renal stones bilaterally. 4. Urethral diverticulum. Electronically Signed   By: Camellia Candle M.D.   On: 02/12/2024 09:03   ____________________________________________  PROCEDURES  Procedure(s) performed:   .Critical Care  Performed by: Lorette Mayo, MD Authorized by: Lorette Mayo, MD   Critical care provider statement:    Critical care time (minutes):  30   Critical care was necessary to treat or prevent imminent or life-threatening deterioration of the following conditions:  Sepsis   Critical care was time spent personally by me on the following activities:  Development of treatment plan with patient or surrogate, discussions with consultants, evaluation of patient's response  to treatment, examination of patient, ordering and review of laboratory studies, ordering and review of radiographic studies, ordering and performing treatments and interventions, pulse oximetry, re-evaluation of patient's condition and review of old charts  ____________________________________________  INITIAL IMPRESSION / ASSESSMENT AND PLAN   Patient reports spontaneous bleeding from a chronic abdominal wound that has been draining previously; bleeding began upon waking and was not controllable with gauze. Plan: Code Sepsis initiated after evaluation based on HR, reported fever, likely leukopenia, tachypnea. Rocephin  for skin coverage, fluids, cultures.  Advise patient to keep wound covered and refrain from removing dressing. Order laboratory tests (CBC, coagulation panel, HIV viral load if not recent). Obtain imaging (CT abdomen/pelvis) to evaluate for deep infection or vascular involvement. Initiate empiric antibiotics for potential deep or opportunistic  infection. Provide pain control as needed and instruct patient to avoid oral intake until further evaluation. Schedule follow-up with infectious disease and surgical consult if imaging indicates deeper involvement.  Initial IIk:tnlwi infection, abscess, arterial bleed, cellulitis, and HIV-related complications.  ED Course  Images ordered viewed and obtained by myself. Agree with Radiology interpretation. Details in ED course.  Labs ordered reviewed by myself as detailed in ED course.  Consultations obtained/considered detailed in ED course.    CT scan and lab tests ordered to evaluate the depth of the wound and rule out arterial involvement. Results pending.  Specialist Consultations Obtained or Considered: Considered surgical consultation if bleeding does not stop or if CT scan reveals arterial involvement.  Care significantly affected by the following chronic Conditions: HIV/AIDS; increases risk for infections and deeper wound complications.  Management:  Medications: Pain medication administered; antibiotics initiated.  Potential for Clinical Deterioration: Yes, due to the risk of ongoing bleeding, potential arterial involvement, and underlying HIV/AIDS increasing infection risk.  High Risk of Morbidity/Mortality Consideration: Yes, due to potential arterial involvement and complications related to HIV/AIDS.  Clinical Course as of 02/12/24 2354  Sat Feb 12, 2024  0815 Received signout; dispo pending completion of workup, likely need admission for antibiotics and wound care. [TY]  0816 CBC with Differential(!!) History of HIV.  Is leukopenic and neutropenic [TY]  0816 Resp panel by RT-PCR (RSV, Flu A&B, Covid) Anterior Nasal Swab Fever not secondary to flu COVID or RSV [TY]  0816 Comprehensive metabolic panel(!) No significant metabolic derangements.  Normal kidney function. [TY]  0817 Lactic Acid, Venous: 1.1 [TY]  0907 CT Angio Abd/Pel W and/or Wo Contrast IMPRESSION: 1. Low  anterior abdominal wall wound again identified. Tiny curvilinear hyperattenuating focus identified along the right margin of the wound. Small focus of active arterial phase hemorrhage at this location is not excluded. This persists on the venous imaging with slightly different configuration but no accumulation of high density to confirm ongoing bleeding. No discernible feeding vessel. No pre contrast imaging to exclude that this represents a radiopaque foreign body in the wound. 2. Otherwise, no acute findings in the abdomen or pelvis. 3. Tiny nonobstructing renal stones bilaterally. 4. Urethral diverticulum.   Electronically Signed   By: Camellia Candle M.D.   On: 02/12/2024 09:03   [TY]  9079 Spoke with Dr. Celinda, hospitalist who agrees to admit patient.  [TY]    Clinical Course User Index [TY] Neysa Caron PARAS, DO       CRITICAL INTERVENTIONS:  Sepsis initiation Cultures antibiotics  FINAL IMPRESSION Final diagnoses:  Sepsis, due to unspecified organism, unspecified whether acute organ dysfunction present [facility])    Care transferred pending labs and ct ____________________________________________   NEW  OUTPATIENT MEDICATIONS STARTED DURING THIS VISIT:  Current Discharge Medication List      Note:  This note was prepared with assistance of Dragon voice recognition software. Occasional wrong-word or sound-a-like substitutions may have occurred due to the inherent limitations of voice recognition software.    Kumar Falwell, Selinda, MD 02/13/24 [phone]

## 2024-02-12 NOTE — ED Notes (Signed)
 CT tech came to have patient checked as her wound is bleeding.  Upon visualization, the wound appears worsened as compared to previous photo in CRT.  Noted some active slow bleeding with numerous clotted areas of blood.  Applied ABD pads, secured with surgical tape.  Linens changed and pt transported back to room.  EDP made aware.

## 2024-02-12 NOTE — ED Notes (Signed)
 Suprapubic wound under abdomen skin fold. Bloody rag in place. Per night shift RN, instructed to keep rag in place as there is a clot on it stopping wound from bleeding. Will keep in place until instructed otherwise. Pt alert and oriented. NAD noted. Remains on monitor. Fluids and abx infusing.

## 2024-02-12 NOTE — Plan of Care (Signed)

## 2024-02-12 NOTE — ED Notes (Signed)
 Patient transported to CT

## 2024-02-12 NOTE — Progress Notes (Signed)
 Plan of Care Note for accepted transfer   Patient: Beverly [name] MRN: [id number]   DOA: 02/12/2024  The patient was started on single-stranded Bactrim  per infectious diseases for pneumocystis prophylaxis.  Author: Alm Dorn Castor, MD 02/12/2024

## 2024-02-12 NOTE — Progress Notes (Signed)
 Plan of Care Note for accepted transfer   Patient: Beverly [name] MRN: [id number]   DOA: 02/12/2024  Facility requesting transfer: [facility].  Requesting Provider: Caron Salt, DO. Reason for transfer: Sepsis due to cellulitis, AIDS, pancytopenia. Facility course:  43 year old female with a past medical history of AIDS not on antiretroviral therapy, Kaposi's sarcoma, HIV neuropathy, VIN I, CIN-2 with severe dysplasia, nephrolithiasis, tobacco use anxiety, depression, chronic abdominal wall ulcer who presented to the emergency department with complaints of suprapubic abscess bleeding, fever, tenderness and shortness of breath.  She was initially tachycardic, tachypneic and has met sepsis criteria.  Given vancomycin  and ceftriaxone .  Plan of care: The patient is accepted for admission to Med-surg  unit, at [facility]   Author: Alm Dorn Castor, MD 02/12/2024  Check [email or web address] for on-call coverage.  Nursing staff, Please call [facility] Admits [facility] Consults System-Wide number on Amion as soon as patient's arrival, so appropriate admitting provider can evaluate the pt.

## 2024-02-12 NOTE — ED Notes (Signed)
 Called [facility] for transport.

## 2024-02-12 NOTE — Sepsis Progress Note (Signed)
 Elink following code sepsis

## 2024-02-12 NOTE — ED Provider Notes (Signed)
 Received signout from Dr. Lorette.  See their note for full HPI.  Briefly, presented for bleeding from her chronic wound in her pannus.  Some fevers intermittently for the past day or 2.  Awoke with bleeding from wound.  Bleeding currently controlled.  Vital signs initially concerning for sepsis; fever at home, but 99.2 here.  However tachycardic and tachypneic.  Has remained hemodynamically stable and is maintaining oxygen saturation on room air.   soft nontender abdomen, but does have large wound to pannus.  No active bleeding, does have foul odor, but no overt purulent drainage.  It does appear larger in size compared to prior media pictures.  Labs with leukopenia and neutropenia, but normal lactate.  CTA ordered by prior team as noted in ED course.  Unfortunately patient has significant barriers to outpatient care she is currently homeless and has been off of her HIV medications for several months.  Will admit for further antibiotics and wound care.  Clinical Course as of 02/12/24 9076  Sat Feb 12, 2024  0815 Received signout; dispo pending completion of workup, likely need admission for antibiotics and wound care. [TY]  0816 CBC with Differential(!!) History of HIV.  Is leukopenic and neutropenic [TY]  0816 Resp panel by RT-PCR (RSV, Flu A&B, Covid) Anterior Nasal Swab Fever not secondary to flu COVID or RSV [TY]  0816 Comprehensive metabolic panel(!) No significant metabolic derangements.  Normal kidney function. [TY]  0817 Lactic Acid, Venous: 1.1 [TY]  0907 CT Angio Abd/Pel W and/or Wo Contrast IMPRESSION: 1. Low anterior abdominal wall wound again identified. Tiny curvilinear hyperattenuating focus identified along the right margin of the wound. Small focus of active arterial phase hemorrhage at this location is not excluded. This persists on the venous imaging with slightly different configuration but no accumulation of high density to confirm ongoing bleeding. No discernible feeding  vessel. No pre contrast imaging to exclude that this represents a radiopaque foreign body in the wound. 2. Otherwise, no acute findings in the abdomen or pelvis. 3. Tiny nonobstructing renal stones bilaterally. 4. Urethral diverticulum.   Electronically Signed   By: Camellia Candle M.D.   On: 02/12/2024 09:03   [TY]  9079 Spoke with Dr. Celinda, hospitalist who agrees to admit patient.  [TY]    Clinical Course User Index [TY] Neysa Caron JINNY ROSALEA Neysa Caron JINNY, [place] 02/12/24 [phone]

## 2024-02-12 NOTE — ED Triage Notes (Addendum)
 Reports rolling over this morning and feeling something wet. When she turned the lights on she saw blood from a baseball sized suprapubic abscess.   AIDS. Reports fevers ~101 at home. No pior to arrival meds.

## 2024-02-13 DIAGNOSIS — L0390 Cellulitis, unspecified: Secondary | ICD-10-CM | POA: Diagnosis not present

## 2024-02-13 DIAGNOSIS — L02211 Cutaneous abscess of abdominal wall: Secondary | ICD-10-CM

## 2024-02-13 DIAGNOSIS — A419 Sepsis, unspecified organism: Secondary | ICD-10-CM | POA: Diagnosis not present

## 2024-02-13 DIAGNOSIS — D703 Neutropenia due to infection: Secondary | ICD-10-CM

## 2024-02-13 LAB — HEMOGLOBIN AND HEMATOCRIT, BLOOD
HCT: 22.8 % — ABNORMAL LOW (ref 36.0–46.0)
Hemoglobin: 6.7 g/dL — CL (ref 12.0–15.0)

## 2024-02-13 LAB — PREPARE RBC (CROSSMATCH)

## 2024-02-13 MED ORDER — SODIUM CHLORIDE 0.9% IV SOLUTION: Freq: Once | INTRAVENOUS | Status: AC

## 2024-02-13 NOTE — Consult Note (Signed)
 [facility]                                                                                        Patient Identification: Patient Name: Beverly [name] MRN: [id number] Admit Date: 02/12/2024  6:16 AM Today's Date: 02/13/2024 Reason for consult: AIDs Requesting provider: Dr Sonjia  Principal Problem:   Sepsis due to cellulitis [facility]) Active Problems:   AIDS (acquired immune deficiency syndrome) (HCC)   Anxiety and depression   Pancytopenia (HCC)   Hypomagnesemia   Bleeding from wound   Antibiotics:  Vancomycin  8/23- Ceftriaxone  8/23, Cefepime  8/23 Bactrim  ppx  Lines/Hardware:  Assessment # Cellulitis of chronic lower abdominal wound associated with bleeding  - unclear cause of abdominal wound and unable to complete work up in the past  # Pancytopenia  # Febrile neutropenia  - in the setting of advanced AIDs, infected wound  # AIDs  - not on ART due to non compliance + homeless, no transport and poor social sitiation   Recommendations  - Continue vancomycin , pharmacy to dose and cefepime  - CBC w diff daily, monitor BMP and Vancomycin  trough  - fu blood cx - Continue Bactrim  ppx. Not started ART yet - HIV RNA, CD4, RPR and Urine GC ordered  - Plan for incisional biopsy and excisional debridement in the OR on 8/25.  Will need AFB stain/cultures as well as fungal stain and cultures including pathology/routine cultures - Engage social work for homelessness  - Universal/standard isolation precautions  Dr Dennise to start following from 8/25  Rest of the management as per the primary team. Please call with questions or concerns.  Thank you for the consult  __________________________________________________________________________________________________________ HPI and Hospital Course ( poor historian and most the history is obtained from chart review) 43 Y O female with prior  h/o AIDs not on ART due to h/o non compliance, Anxiety/Depression, Neuropathy, Asthma, Kaposi sarcoma, chronic lower abdominal wall wound, smoking and alcohol use who presented to the ED on 8/23 for bleeding/soaked in blood from chronic suprapubic wound as well as intermittent fevers for 1 to 2-day. No reported chest pain, cough or shortness of breath. Mild nausea, denies vomiting abdominal pain or diarrhea or constipation.  No GU symptoms.   She reports not taking her ART for several months and does not really have an answer. Reports poor appetite and undefined loss of weight. Lives with her friends in between homes.  Denies being sexually active.  Denies smoking alcohol and recreational drugs.   At ED afebrile, tachycardic and tachypneic.Foul order from wound Labs remarkable for magnesium  1.5, albumin 2.9, lactic acid 1.1, WBC 0.8, hemoglobin 8.2, platelets 93.  ANC 300 Serum pregnancy test negative Influenza A/influenza B/RSV/SARS-CoV-2 negative  CT abdomen pelvis  1. Low anterior abdominal wall wound again identified. Tiny curvilinear hyperattenuating focus identified along the right margin of the wound. Small focus of active arterial phase hemorrhage at this location is not excluded. This persists on the venous imaging with slightly different configuration but no accumulation of high density to confirm ongoing bleeding. No discernible feeding vessel. No pre contrast imaging to exclude that this represents  a radiopaque foreign body in the wound. 2. Otherwise, no acute findings in the abdomen or pelvis. 3. Tiny nonobstructing renal stones bilaterally. 4. Urethral diverticulum.  Was given IVF, started on vancomycin , cefepime /ceftriaxone    ROS: General- Denies chills HEENT - Denies headache, blurry vision, neck pain, sinus pain Chest - Denies any chest pain, SOB or cough CVS- Denies any dizziness/lightheadedness, syncopal attacks, palpitations Abdomen- Denies any nausea, vomiting,  abdominal pain, hematochezia and diarrhea Neuro - Denies any weakness, numbness, tingling sensation Psych - Denies any changes in mood irritability or depressive symptoms GU- Denies any burning, dysuria, hematuria or increased frequency of urination MSK - denies any joint pain/swelling or restricted ROM   Past Medical History:  Diagnosis Date   Abscess    AIDS (HCC) 03/19/2015   Anxiety    Asthma    Blood transfusion without reported diagnosis    Depression    HIV infection (HCC)    Homeless 03/19/2015   states stays with family or friends. Does not rent or own a home, but does not live on streets.   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   Skin ulcer (HCC) 05/06/2015   Past Surgical History:  Procedure Laterality Date   CERVICAL CONIZATION W/BX N/A 04/14/2018   Procedure: COLD KNIFE CONIZATION CERVIX WITH BIOPSY;  Surgeon: Anitra Freddy NOVAK, MD;  Location: [facility];  Service: Gynecology;  Laterality: N/A;   CESAREAN SECTION     DILATION AND CURETTAGE OF UTERUS N/A 04/14/2018   Procedure: ENDOCERVICAL CURETTAGE;  Surgeon: Anitra Freddy NOVAK, MD;  Location: [facility];  Service: Gynecology;  Laterality: N/A;   INCISION AND DRAINAGE ABSCESS Right 01/10/2016   Procedure: INCISION AND DRAINAGE RIGHT BUTTOCK, LEFT LABIAL , EXCISION AND DRAINAGE OF  RIGHT AXILLARY ABSCESS;  Surgeon: Morene Olives, MD;  Location: [facility];  Service: General;  Laterality: Right;   TUBAL LIGATION  2005   VULVA MARYBETH BIOPSY N/A 04/14/2018   Procedure: VULVAR BIOPSIES;  Surgeon: Anitra Freddy NOVAK, MD;  Location: [facility];  Service: Gynecology;  Laterality: N/A;    Scheduled Meds:  sulfamethoxazole -trimethoprim   1 tablet Oral Daily   Continuous Infusions:  ceFEPime  (MAXIPIME ) IV 2 g (02/13/24 0607)   vancomycin  1,250 mg (02/13/24  0924)   PRN Meds:.acetaminophen  **OR** acetaminophen , diphenhydrAMINE , HYDROmorphone  (DILAUDID ) injection, hydrOXYzine , ondansetron  **OR** ondansetron  (ZOFRAN ) IV, oxyCODONE   Allergies  Allergen Reactions   Bee Venom Anaphylaxis, Shortness Of Breath and Swelling   Ferrlecit  [Na Ferric Gluc Cplx In Sucrose] Swelling    Swelling and puffiness in hands and legs after Ferrlecit  250 mg infused over 60 minutes (possibly infusion related reaction)   Morphine  And Codeine Hives and Itching    Just can't take morphine , vicodin is OK   Social History   Socioeconomic History   Marital status: Single    Spouse name: Not on file   Number of children: 4   Years of education: Not on file   Highest education level: Not on file  Occupational History   Occupation: Production designer, theatre/television/film at [employer]  Tobacco Use   Smoking status: Every Day    Current packs/day: 0.10    Average packs/day: 0.1 packs/day for 3.0 years (0.3 ttl pk-yrs)    Types: Cigarettes   Smokeless tobacco: Never   Tobacco comments:    1-2 a day  Vaping Use   Vaping status:  Never Used  Substance and Sexual Activity   Alcohol use: Yes    Alcohol/week: 0.0 standard drinks of alcohol    Comment: Ocassionally   Drug use: Yes    Frequency: 2.0 times per week    Types: Marijuana, MDMA (Ecstacy)   Sexual activity: Not Currently    Partners: Male    Birth control/protection: Condom, Surgical    Comment: pt. given condoms  Other Topics Concern   Not on file  Social History Narrative   Not on file   Social Drivers of Health   Financial Resource Strain: Not on file  Food Insecurity: Food Insecurity Present (02/12/2024)   Hunger Vital Sign    Worried About Running Out of Food in the Last Year: Often true    Ran Out of Food in the Last Year: Often true  Transportation Needs: Unmet Transportation Needs (02/12/2024)   PRAPARE - Administrator, Civil Service (Medical): Yes    Lack of Transportation (Non-Medical): Yes  Physical  Activity: Not on file  Stress: Not on file  Social Connections: Not on file  Intimate Partner Violence: Not At Risk (02/12/2024)   Humiliation, Afraid, Rape, and Kick questionnaire    Fear of Current or Ex-Partner: No    Emotionally Abused: No    Physically Abused: No    Sexually Abused: No   Family History  Problem Relation Age of Onset   Throat cancer Mother        smoker   Throat cancer Father        smoker   Hypertension Father    Cirrhosis Father    Hypertension Other    Cancer Other        smoker   Diabetes Other    Asthma Other     Vitals BP 116/83 (BP Location: Left Arm)   Pulse 88   Temp 98.3 F (36.8 C)   Resp 19   Ht 5' 1 (1.549 m)   Wt 59 kg   SpO2 100%   BMI 24.56 kg/m   Physical Exam Constitutional: Adult female lying in the bed, not in acute distress    Comments: HEENT WNL  Cardiovascular:     Rate and Rhythm: Normal rate and regular rhythm.     Heart sounds: S1 and S2  Pulmonary:     Effort: Pulmonary effort is normal.     Comments: Normal breath sounds  Abdominal:     Palpations: Abdomen is soft.     Tenderness: Chronic lower abdominal wound with some tenderness surrounding the wound, no fluctuance or crepitus   Musculoskeletal:        General: No swelling or tenderness peripheral joints  Skin:    Comments: No active rashes  Neurological:     General: Awake alert and oriented nonfocal exam  Psychiatric:        Mood and Affect: Mood normal.    Pertinent Microbiology Results for orders placed or performed during the hospital encounter of 02/12/24  Blood Culture (routine x 2)     Status: None (Preliminary result)   Collection Time: 02/12/24  6:40 AM   Specimen: BLOOD  Result Value Ref Range Status   Specimen Description   Final    BLOOD LEFT ANTECUBITAL Performed at [facility], [address] [place], [place] [address]    Special Requests   Final    BOTTLES DRAWN AEROBIC AND ANAEROBIC Blood Culture adequate  volume Performed at [facility], [address] [place], [place] [address]    Culture   Final    NO GROWTH < 24 HOURS Performed at [facility], [address], [place], [place] [address]    Report Status PENDING  Incomplete  Resp panel by RT-PCR (RSV, Flu A&B, Covid) Anterior Nasal Swab     Status: None   Collection Time: 02/12/24  6:50 AM   Specimen: Anterior Nasal Swab  Result Value Ref Range Status   SARS Coronavirus 2 by RT PCR NEGATIVE NEGATIVE Final    Comment: (NOTE) SARS-CoV-2 target nucleic acids are NOT DETECTED.  The SARS-CoV-2 RNA is generally detectable in upper respiratory specimens during the acute phase of infection. The lowest concentration of SARS-CoV-2 viral copies this assay can detect is 138 copies/mL. A negative result does not preclude SARS-Cov-2 infection and should not be used as the sole basis for treatment or other patient management decisions. A negative result may occur with  improper specimen collection/handling, submission of specimen other than nasopharyngeal swab, presence of viral mutation(s) within the areas targeted by this assay, and inadequate number of viral copies(<138 copies/mL). A negative result must be combined with clinical observations, patient history, and epidemiological information. The expected result is Negative.  Fact Sheet for Patients:  BloggerCourse.com  Fact Sheet for Healthcare Providers:  SeriousBroker.it  This test is no t yet approved or cleared by the [place]  FDA and  has been authorized for detection and/or diagnosis of SARS-CoV-2 by FDA under an Emergency Use Authorization (EUA). This EUA will remain  in effect (meaning this test can be used) for the duration of the COVID-19 declaration under Section 564(b)(1) of the Act, 21 U.S.C.section 360bbb-3(b)(1), unless the authorization is terminated  or revoked sooner.       Influenza A by PCR  NEGATIVE NEGATIVE Final   Influenza B by PCR NEGATIVE NEGATIVE Final    Comment: (NOTE) The Xpert Xpress SARS-CoV-2/FLU/RSV plus assay is intended as an aid in the diagnosis of influenza from Nasopharyngeal swab specimens and should not be used as a sole basis for treatment. Nasal washings and aspirates are unacceptable for Xpert Xpress SARS-CoV-2/FLU/RSV testing.  Fact Sheet for Patients: BloggerCourse.com  Fact Sheet for Healthcare Providers: SeriousBroker.it  This test is not yet approved or cleared by the [place]  FDA and has been authorized for detection and/or diagnosis of SARS-CoV-2 by FDA under an Emergency Use Authorization (EUA). This EUA will remain in effect (meaning this test can be used) for the duration of the COVID-19 declaration under Section 564(b)(1) of the Act, 21 U.S.C. section 360bbb-3(b)(1), unless the authorization is terminated or revoked.     Resp Syncytial Virus by PCR NEGATIVE NEGATIVE Final    Comment: (NOTE) Fact Sheet for Patients: BloggerCourse.com  Fact Sheet for Healthcare Providers: SeriousBroker.it  This test is not yet approved or cleared by the [place]  FDA and has been authorized for detection and/or diagnosis of SARS-CoV-2 by FDA under an Emergency Use Authorization (EUA). This EUA will remain in effect (meaning this test can be used) for the duration of the COVID-19 declaration under Section 564(b)(1) of the Act, 21 U.S.C. section 360bbb-3(b)(1), unless the authorization is terminated or revoked.  Performed at [facility], [address] [place], [place] [address]   Blood Culture (routine x 2)     Status: None (Preliminary result)   Collection Time: 02/12/24  6:52 AM   Specimen: BLOOD  Result Value Ref Range Status   Specimen Description  Final    BLOOD RIGHT ANTECUBITAL Performed at [facility],  [address] [place], [place] [address]    Special Requests   Final    BOTTLES DRAWN AEROBIC AND ANAEROBIC Blood Culture adequate volume Performed at [facility], [address] [place], [place] [address]    Culture   Final    NO GROWTH < 24 HOURS Performed at [facility], [address] [place], [place] [address]    Report Status PENDING  Incomplete   Pertinent Lab seen by me:    Latest Ref Rng & Units 02/12/2024    6:57 PM 02/12/2024    1:53 PM 02/12/2024    6:50 AM  CBC  WBC 4.0 - 10.5 K/uL   0.8   Hemoglobin 12.0 - 15.0 g/dL 7.0  7.4  8.2   Hematocrit 36.0 - 46.0 % 23.8  26.1  26.5   Platelets 150 - 400 K/uL   93       Latest Ref Rng & Units 02/12/2024    6:50 AM 01/25/2024    2:25 PM 11/22/2023    3:16 PM  CMP  Glucose 70 - 99 mg/dL 98  887  877   BUN 6 - 20 mg/dL 12  18  11    Creatinine 0.44 - 1.00 mg/dL 9.19  9.12  9.43   Sodium 135 - 145 mmol/L 135  136  136   Potassium 3.5 - 5.1 mmol/L 3.5  3.6  3.6   Chloride 98 - 111 mmol/L 103  102  104   CO2 22 - 32 mmol/L 21  22  20    Calcium  8.9 - 10.3 mg/dL 7.8  8.5  8.4   Total Protein 6.5 - 8.1 g/dL 8.0  9.3  9.0   Total Bilirubin 0.0 - 1.2 mg/dL 0.7  0.6  0.6   Alkaline Phos 38 - 126 U/L 116  139  116   AST 15 - 41 U/L 40  42  27   ALT 0 - 44 U/L 11  14  12      Pertinent Imagings/Other Imagings Plain films and CT images have been personally visualized and interpreted; radiology reports have been reviewed. Decision making incorporated into the Impression / Recommendations.  CT Angio Abd/Pel W and/or Wo Contrast Result Date: 02/12/2024 CLINICAL DATA:  Bleeding wound under the pannus of the suprapubic area. EXAM: CTA ABDOMEN AND PELVIS WITHOUT AND WITH CONTRAST TECHNIQUE: Multidetector CT imaging of the abdomen and pelvis was performed using the standard protocol during bolus administration of intravenous contrast. Multiplanar reconstructed images and MIPs were obtained and reviewed to evaluate the  vascular anatomy. RADIATION DOSE REDUCTION: This exam was performed according to the departmental dose-optimization program which includes automated exposure control, adjustment of the mA and/or kV according to patient size and/or use of iterative reconstruction technique. CONTRAST:  OMNIPAQUE  IOHEXOL  350 MG/ML SOLN COMPARISON:  None Available. FINDINGS: VASCULAR Aorta: Normal caliber aorta without aneurysm, dissection, vasculitis or significant stenosis. Celiac: Choose 1 SMA: Choose 1 Renals: Both renal arteries are patent without evidence of aneurysm, dissection, vasculitis, fibromuscular dysplasia or significant stenosis. Accessory right renal artery evident. 2 accessory renal arteries are seen on the left. IMA: Patent without evidence of aneurysm, dissection, vasculitis or significant stenosis. Inflow: Patent without evidence of aneurysm, dissection, vasculitis or significant stenosis. Proximal Outflow: Bilateral common femoral and visualized portions of the superficial and profunda femoral arteries are patent without evidence of aneurysm, dissection, vasculitis or  significant stenosis. Veins: No obvious venous abnormality within the limitations of this arterial phase study. Review of the MIP images confirms the above findings. NON-VASCULAR Lower chest: No acute findings. Hepatobiliary: No suspicious focal abnormality within the liver parenchyma. Small area of low attenuation in the anterior liver, adjacent to the falciform ligament, is in a characteristic location for focal fatty deposition. There is no evidence for gallstones, gallbladder wall thickening, or pericholecystic fluid. No intrahepatic or extrahepatic biliary dilation. Pancreas: No focal mass lesion. No dilatation of the main duct. No intraparenchymal cyst. No peripancreatic edema. Spleen: No splenomegaly. No suspicious focal mass lesion. Adrenals/Urinary Tract: No adrenal nodule or mass. Tiny nonobstructing renal stones bilaterally. No  evidence for hydroureter. The urinary bladder appears normal for the degree of distention. Urethral diverticulum evident. Stomach/Bowel: Stomach is unremarkable. No gastric wall thickening. No evidence of outlet obstruction. Duodenum is normally positioned as is the ligament of Treitz. No small bowel wall thickening. No small bowel dilatation. The terminal ileum is normal. The appendix is not well visualized, but there is no edema or inflammation in the region of the cecal tip to suggest appendicitis. No gross colonic mass. No colonic wall thickening. Lymphatic: No abdominal lymphadenopathy. Upper normal groin lymph nodes are seen bilaterally. Reproductive: The uterus is unremarkable.  There is no adnexal mass. Other: No substantial intraperitoneal free fluid. Musculoskeletal: Low anterior abdominal wall wound again identified. Tiny curvilinear hyperattenuating focus identified along the right margin of the wound (see axial 494/series 10). This persists on the venous imaging but has slightly different configuration without accumulation. Small focus of active arterial phase hemorrhage at this location is not excluded. No discernible feeding vessel. No pre contrast imaging to exclude that this represents a radiopaque foreign body in the wound. No worrisome lytic or sclerotic osseous abnormality. IMPRESSION: 1. Low anterior abdominal wall wound again identified. Tiny curvilinear hyperattenuating focus identified along the right margin of the wound. Small focus of active arterial phase hemorrhage at this location is not excluded. This persists on the venous imaging with slightly different configuration but no accumulation of high density to confirm ongoing bleeding. No discernible feeding vessel. No pre contrast imaging to exclude that this represents a radiopaque foreign body in the wound. 2. Otherwise, no acute findings in the abdomen or pelvis. 3. Tiny nonobstructing renal stones bilaterally. 4. Urethral  diverticulum. Electronically Signed   By: Camellia Candle M.D.   On: 02/12/2024 09:03   CT L-SPINE NO CHARGE Result Date: 01/25/2024 CLINICAL DATA:  Acute back pain. EXAM: CT LUMBAR SPINE WITHOUT CONTRAST TECHNIQUE: Multidetector CT imaging of the lumbar spine was performed without intravenous contrast administration. Multiplanar CT image reconstructions were also generated. RADIATION DOSE REDUCTION: This exam was performed according to the departmental dose-optimization program which includes automated exposure control, adjustment of the mA and/or kV according to patient size and/or use of iterative reconstruction technique. COMPARISON:  Lumbar spine CT dated 10/26/2021. FINDINGS: Segmentation: 5 lumbar type vertebrae. Alignment: Normal. Vertebrae: No acute fracture or focal pathologic process. Paraspinal and other soft tissues: No paraspinal fluid collection or hematoma. Nonobstructing right renal calculi noted. Disc levels: No spinal canal stenosis. No significant degenerative changes. IMPRESSION: 1. No acute/traumatic lumbar spine pathology. 2. Nonobstructing right renal calculi. Electronically Signed   By: Vanetta Chou M.D.   On: 01/25/2024 16:11   CT ABDOMEN PELVIS W CONTRAST Addendum Date: 01/25/2024 ADDENDUM REPORT: 01/25/2024 16:08 ADDENDUM: Two nonobstructing right renal calculi measure up to 5 mm are present. Electronically Signed   By:  Vanetta Chou M.D.   On: 01/25/2024 16:08   Result Date: 01/25/2024 CLINICAL DATA:  Abdominal pain. EXAM: CT ABDOMEN AND PELVIS WITH CONTRAST TECHNIQUE: Multidetector CT imaging of the abdomen and pelvis was performed using the standard protocol following bolus administration of intravenous contrast. RADIATION DOSE REDUCTION: This exam was performed according to the departmental dose-optimization program which includes automated exposure control, adjustment of the mA and/or kV according to patient size and/or use of iterative reconstruction technique. CONTRAST:   OMNIPAQUE  IOHEXOL  300 MG/ML  SOLN COMPARISON:  CT abdomen pelvis dated 11/22/2023. FINDINGS: Evaluation of this exam is limited due to respiratory motion. Lower chest: The visualized lung bases are clear. No intra-abdominal free air or free fluid. Hepatobiliary: No focal liver abnormality is seen. No gallstones, gallbladder wall thickening, or biliary dilatation. Pancreas: Unremarkable. No pancreatic ductal dilatation or surrounding inflammatory changes. Spleen: Normal in size without focal abnormality. Adrenals/Urinary Tract: The adrenal glands unremarkable. There is no hydronephrosis on either side. The visualized ureters and urinary bladder appear unremarkable. Stomach/Bowel: There is no bowel obstruction or active inflammation. The appendix is normal. Vascular/Lymphatic: The abdominal aorta and IVC unremarkable. No portal venous gas. There is no adenopathy. Reproductive: The uterus is anteverted. No suspicious adnexal masses. Other: Anterior pelvic wall surgical incision with overlying dressing. No drainable fluid collection or abscess. Musculoskeletal: No acute or significant osseous findings. IMPRESSION: 1. No acute intra-abdominal or pelvic pathology. 2. Anterior pelvic wall surgical incision with overlying dressing. No drainable fluid collection or abscess. Electronically Signed: By: Vanetta Chou M.D. On: 01/25/2024 16:04   I spent 85 minutes involved in face-to-face and non-face-to-face activities for this patient on the day of the visit. Professional time spent includes the following activities: Preparing to see the patient (review of tests), Obtaining and reviewing separately obtained history (ED note, H&P, surgical note and hospitalist progress note), Performing a medically appropriate examination and evaluation , Ordering medications/labs, referring and communicating with other health care professionals, Documenting clinical information in the EMR, Independently interpreting results (not  separately reported), Communicating results to the patient, Counseling and educating the patient and Care coordination (not separately reported).  Electronically signed by:   Plan d/w requesting provider as well as ID pharm D  Of note, portions of this note may have been created with voice recognition software. While this note has been edited for accuracy, occasional wrong-word or 'sound-a-like' substitutions may have occurred due to the inherent limitations of voice recognition software.   Annalee Orem, MD Infectious Disease Physician [facility] Pager: [phone]

## 2024-02-13 NOTE — TOC Initial Note (Signed)
 Transition of Care [facility]) - Initial/Assessment Note    Patient Details  Name: Beverly [name] MRN: [id number] Date of Birth: 1981/06/18  Transition [facility] CM/SW Contact:    Heather DELENA Saltness, LCSW Phone Number: 02/13/2024, 10:26 AM  Clinical Narrative:                 TOC consulted for assistance with housing. Housing resources added to AVS. Pt may need transportation assistance at discharge. TOC will continue to follow.  Expected Discharge Plan: Home/Self Care Barriers to Discharge: Continued Medical Work up   Patient Goals and CMS Choice Patient states their goals for this hospitalization and ongoing recovery are:: To return home          Expected Discharge Plan and Services In-house Referral: Clinical Social Work Discharge Planning Services: NA Post Acute Care Choice: NA Living arrangements for the past 2 months: Homeless                 DME Arranged: N/A DME Agency: NA       HH Arranged: NA HH Agency: NA        Prior Living Arrangements/Services Living arrangements for the past 2 months: Homeless Lives with:: Self Patient language and need for interpreter reviewed:: Yes Do you feel safe going back to the place where you live?: Yes      Need for Family Participation in Patient Care: No (Comment) Care giver support system in place?: No (comment)   Criminal Activity/Legal Involvement Pertinent to Current Situation/Hospitalization: No - Comment as needed  Activities of Daily Living   ADL Screening (condition at time of admission) Independently performs ADLs?: No Does the patient have a NEW difficulty with bathing/dressing/toileting/self-feeding that is expected to last >3 days?: No Does the patient have a NEW difficulty with getting in/out of bed, walking, or climbing stairs that is expected to last >3 days?: No Does the patient have a NEW difficulty with communication that is expected to last >3 days?: No Is the patient deaf or have difficulty hearing?:  No Does the patient have difficulty seeing, even when wearing glasses/contacts?: No Does the patient have difficulty concentrating, remembering, or making decisions?: No  Permission Sought/Granted   Permission granted to share information with : No     Emotional Assessment Appearance:: Appears stated age Attitude/Demeanor/Rapport: Unable to Assess Affect (typically observed): Unable to Assess Orientation: : Oriented to Self, Oriented to Place, Oriented to  Time, Oriented to Situation Alcohol / Substance Use: Tobacco Use Psych Involvement: No (comment)  Admission diagnosis:  Sepsis due to cellulitis (HCC) [L03.90, A41.9] Patient Active Problem List   Diagnosis Date Noted   Sepsis due to cellulitis (HCC) 02/12/2024   Bleeding from wound 02/12/2024   Housing insecurity 01/01/2023   Transportation insecurity 01/01/2023   Cellulitis 12/21/2022   Hypocalcemia 12/21/2022   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   Nephrolithiasis 12/21/2022   Hypomagnesemia 01/23/2021   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   Nonintractable headache    Generalized lymphadenopathy    Skin rash associated with AIDS (HCC) 08/27/2020   Cellulitis of abdominal wall    HIV disease (HCC)    Weight loss    Nausea & vomiting 05/25/2018   Abdominal wall skin ulcer (HCC) 03/10/2018   Itching 03/10/2018   History of 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/11/2016   Pancytopenia (HCC) 01/09/2016   Non-healing skin lesion 05/06/2015   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 [place] [address] Phone: [phone] Fax: [phone]  [id number] - [place], [place] - [address] AT [facility] [address] [place] [address] Phone: [phone] Fax: [phone]     Social Drivers of Health (SDOH) Social History: SDOH Screenings   Food Insecurity: Food Insecurity Present (02/12/2024)  Housing: High Risk (02/12/2024)  Transportation Needs: Unmet Transportation Needs (02/12/2024)  Utilities: At Risk (02/12/2024)  Depression (PHQ2-9): High Risk (12/31/2022)  Tobacco Use: High Risk (02/12/2024)   SDOH Interventions: Housing resources given     Readmission Risk Interventions    02/13/2024   10:23 AM  Readmission Risk Prevention Plan  Transportation Screening Complete  PCP or Specialist Appt within 5-7 Days Not Complete  Not Complete comments N/A, pt has Medicaid  Home Care Screening Complete  Medication Review (RN CM) Complete    Signed: Heather Saltness, MSW, LCSW Clinical Social Worker Inpatient Care Management 02/13/2024 10:27 AM

## 2024-02-13 NOTE — Progress Notes (Addendum)
 PROGRESS NOTE  Beverly [name] FMW:[id number] DOB: 11/07/1980 DOA: 02/12/2024 PCP: Patient, No Pcp Per   LOS: 1 day   Brief narrative:   Beverly [name] is a 43 y.o. 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  She also complained of fever tenderness and shortness of breath.  In the ED vitals were notable for hypotension with blood pressure low at 80/50.  Labs were notable  white count of 0.8, hemoglobin 8.2 g/dL and platelets 93.  PT was 15.9 INR 1.2.  CMP showed normal electrolytes after calcium  correction.  CTA GI bleed with no evidence of contrast extravasation into the gastric, duodenum, small bowel or colon lumen.  No acute findings in the abdomen or pelvis.  Left colonic diverticulosis without diverticulitis.  Patient received normal saline 2000 mL bolus, vancomycin  and cefepime  and was considered for admission to hospital for further evaluation and treatment.   Assessment/Plan: Principal Problem:   Sepsis due to cellulitis [facility]) Active Problems:   Pancytopenia (HCC)   Hypomagnesemia   AIDS (acquired immune deficiency syndrome) (HCC)   Anxiety and depression   Bleeding from wound   Sepsis due to cellulitis /  Bleeding from wound Patient met sepsis criteria with being tachycardic tachypneic with temperature max of 101.3 F with a lower abdominal wound infection.  Lactate was not elevated. Continue vancomycin  and cefepime .   Communicated with general surgery and might need wound biopsy and debridement likely tomorrow.  Will keep n.p.o. after midnight.  Blood cultures are negative in less than 24 hours.  Will reach out to ID for consultation.  Type and screen has been sent.   Will check CBC in p.m. for hemoglobin.    Pancytopenia, neutropenia. In the setting of HIV disease.  ID has been consulted.  Currently on antibiotic.  Follow cultures.  Seen by Dr. Vandiver in the past.  Has been started on Bactrim  for pneumocystis prophylaxis.  Hemoglobin of 7.0 today.  Might need transfusion if continues to bleed.   HIV/AIDS, history of Kaposi's sarcoma, HIV neuropathy (acquired immune deficiency syndrome)  Not on antiretrovirals.  Likely compliance issue due to homelessness.  Used to see ID as outpatient in the past but since that she is currently homeless has not had a chance to follow-up with ID.  Will reach out to ID for consultation.     Hypomagnesemia Magnesium  sulfate was replenished.  Check levels in AM.     Anxiety and depression Not on medications at this time.   DVT prophylaxis: SCDs Start: 02/12/24 1111   Disposition: Home likely in 2 to 3 days  Status is: Inpatient Remains inpatient appropriate because: Pending clinical improvement, sepsis, IV antibiotic, ID general surgery follow-up    Code Status:     Code Status: Full Code  Family Communication: None at bedside, spoke patient's sister Ms. Clotilda on the phone but was unable to reach out to her.  Consultants: General Surgery ID  Procedures: None yet  Anti-infectives:  Vancomycin , Bactrim , cefepime   Anti-infectives (From admission, onward)    Start     Dose/Rate Route Frequency Ordered Stop   02/13/24 1000  vancomycin  (VANCOREADY) IVPB 1250 mg/250 mL        1,250 mg 166.7 mL/hr over 90 Minutes  Intravenous Every 24 hours 02/12/24 1119     02/12/24 1530  sulfamethoxazole -trimethoprim  (BACTRIM ) 400-80 MG per tablet 1 tablet        1 tablet Oral Daily 02/12/24 1441     02/12/24 1400  ceFEPIme  (MAXIPIME ) 2 g in sodium chloride  0.9 % 100 mL IVPB        2 g 200 mL/hr over 30 Minutes Intravenous Every 8 hours 02/12/24 1119     02/12/24 1000  vancomycin  (VANCOCIN ) IVPB 1000 mg/200 mL premix         1,000 mg 200 mL/hr over 60 Minutes Intravenous  Once 02/12/24 0951 02/12/24 1111   02/12/24 0645  cefTRIAXone  (ROCEPHIN ) 2 g in sodium chloride  0.9 % 100 mL IVPB        2 g 200 mL/hr over 30 Minutes Intravenous Once 02/12/24 0639 02/12/24 0737       Subjective: Today, patient was seen and examined at bedside.  Patient complains of mild abdominal discomfort, no nausea no vomiting.  Has generalized weakness and fatigue.  States that her bleeding has subsided after dressing.  Objective: Vitals:   02/12/24 2324 02/13/24 0504  BP: 101/70 116/83  Pulse: 87 88  Resp: 18 19  Temp: 98.3 F (36.8 C) 98.3 F (36.8 C)  SpO2: 100% 100%    Intake/Output Summary (Last 24 hours) at 02/13/2024 0916 Last data filed at 02/13/2024 0329 Gross per 24 hour  Intake 4677.71 ml  Output --  Net 4677.71 ml   Filed Weights   02/12/24 0628  Weight: 59 kg   Body mass index is 24.56 kg/m.   Physical Exam:  GENERAL: Patient is alert awake and oriented. Not in obvious distress. HENT: No scleral pallor or icterus. Pupils equally reactive to light. Oral mucosa is moist, right upper lid area with noted liver lesion. NECK: is supple, no gross swelling noted. CHEST: Clear to auscultation. No crackles or wheezes. CVS: S1 and S2 heard, no murmur. Regular rate and rhythm.  ABDOMEN: Soft, non-tender, bowel sounds are present.  Lower abdominal dressing present with tenderness on local palpation. EXTREMITIES: No edema. CNS: Cranial nerves are intact. No focal motor deficits. SKIN: warm and dry, abdominal wound with dressing.  Pic taken on presentation on 8/23   Data Review: I have personally reviewed the following laboratory data and studies,  CBC: Recent Labs  Lab 02/12/24 0650 02/12/24 1353 02/12/24 1857  WBC 0.8*  --   --   NEUTROABS 0.3*  --   --   HGB 8.2* 7.4* 7.0*  HCT 26.5* 26.1* 23.8*  MCV 79.8*  --   --   PLT 93*  --   --    Basic Metabolic Panel: Recent Labs  Lab  02/12/24 0650  NA 135  K 3.5  CL 103  CO2 21*  GLUCOSE 98  BUN 12  CREATININE 0.80  CALCIUM  7.8*  MG 1.5*  PHOS 3.0   Liver Function Tests: Recent Labs  Lab 02/12/24 0650  AST 40  ALT 11  ALKPHOS 116  BILITOT 0.7  PROT 8.0  ALBUMIN 2.9*   No results for input(s): LIPASE, AMYLASE in the last 168 hours. No results for input(s): AMMONIA in the last 168 hours. Cardiac Enzymes: No results for input(s): CKTOTAL, CKMB, CKMBINDEX, TROPONINI in the last 168 hours. BNP (last 3 results) No results for input(s): BNP in the last 8760 hours.  ProBNP (last 3 results) No results for input(s): PROBNP in the last 8760 hours.  CBG: No results for input(s):  GLUCAP in the last 168 hours. Recent Results (from the past 240 hours)  Blood Culture (routine x 2)     Status: None (Preliminary result)   Collection Time: 02/12/24  6:40 AM   Specimen: BLOOD  Result Value Ref Range Status   Specimen Description   Final    BLOOD LEFT ANTECUBITAL Performed at [facility], [address] [place], [place] [address]    Special Requests   Final    BOTTLES DRAWN AEROBIC AND ANAEROBIC Blood Culture adequate volume Performed at [facility], [address] [place], [place] [address]    Culture   Final    NO GROWTH < 24 HOURS Performed at [facility], [address] [place], [place] [address]    Report Status PENDING  Incomplete  Resp panel by RT-PCR (RSV, Flu A&B, Covid) Anterior Nasal Swab     Status: None   Collection Time: 02/12/24  6:50 AM   Specimen: Anterior Nasal Swab  Result Value Ref Range Status   SARS Coronavirus 2 by RT PCR NEGATIVE NEGATIVE Final    Comment: (NOTE) SARS-CoV-2 target nucleic acids are NOT DETECTED.  The SARS-CoV-2 RNA is generally detectable in upper respiratory specimens during the acute phase of infection. The lowest concentration of SARS-CoV-2 viral copies this assay can detect is 138 copies/mL. A negative  result does not preclude SARS-Cov-2 infection and should not be used as the sole basis for treatment or other patient management decisions. A negative result may occur with  improper specimen collection/handling, submission of specimen other than nasopharyngeal swab, presence of viral mutation(s) within the areas targeted by this assay, and inadequate number of viral copies(<138 copies/mL). A negative result must be combined with clinical observations, patient history, and epidemiological information. The expected result is Negative.  Fact Sheet for Patients:  BloggerCourse.com  Fact Sheet for Healthcare Providers:  SeriousBroker.it  This test is no t yet approved or cleared by the [place]  FDA and  has been authorized for detection and/or diagnosis of SARS-CoV-2 by FDA under an Emergency Use Authorization (EUA). This EUA will remain  in effect (meaning this test can be used) for the duration of the COVID-19 declaration under Section 564(b)(1) of the Act, 21 U.S.C.section 360bbb-3(b)(1), unless the authorization is terminated  or revoked sooner.       Influenza A by PCR NEGATIVE NEGATIVE Final   Influenza B by PCR NEGATIVE NEGATIVE Final    Comment: (NOTE) The Xpert Xpress SARS-CoV-2/FLU/RSV plus assay is intended as an aid in the diagnosis of influenza from Nasopharyngeal swab specimens and should not be used as a sole basis for treatment. Nasal washings and aspirates are unacceptable for Xpert Xpress SARS-CoV-2/FLU/RSV testing.  Fact Sheet for Patients: BloggerCourse.com  Fact Sheet for Healthcare Providers: SeriousBroker.it  This test is not yet approved or cleared by the [place]  FDA and has been authorized for detection and/or diagnosis of SARS-CoV-2 by FDA under an Emergency Use Authorization (EUA). This EUA will remain in effect (meaning this test can be used)  for the duration of the COVID-19 declaration under Section 564(b)(1) of the Act, 21 U.S.C. section 360bbb-3(b)(1), unless the authorization is terminated or revoked.     Resp Syncytial Virus by PCR NEGATIVE NEGATIVE Final    Comment: (NOTE) Fact Sheet for Patients: BloggerCourse.com  Fact Sheet for Healthcare Providers: SeriousBroker.it  This test is not yet approved or cleared by the [place]  FDA and has been authorized for detection  and/or diagnosis of SARS-CoV-2 by FDA under an Emergency Use Authorization (EUA). This EUA will remain in effect (meaning this test can be used) for the duration of the COVID-19 declaration under Section 564(b)(1) of the Act, 21 U.S.C. section 360bbb-3(b)(1), unless the authorization is terminated or revoked.  Performed at [facility], [address] [place], [place] [address]   Blood Culture (routine x 2)     Status: None (Preliminary result)   Collection Time: 02/12/24  6:52 AM   Specimen: BLOOD  Result Value Ref Range Status   Specimen Description   Final    BLOOD RIGHT ANTECUBITAL Performed at [facility], [address] [place], [place] [address]    Special Requests   Final    BOTTLES DRAWN AEROBIC AND ANAEROBIC Blood Culture adequate volume Performed at [facility], [address] [place], [place] [address]    Culture   Final    NO GROWTH < 24 HOURS Performed at [facility], [address], [place], [place] [address]    Report Status PENDING  Incomplete     Studies: CT Angio Abd/Pel W and/or Wo Contrast Result Date: 02/12/2024 CLINICAL DATA:  Bleeding wound under the pannus of the suprapubic area. EXAM: CTA ABDOMEN AND PELVIS WITHOUT AND WITH CONTRAST TECHNIQUE: Multidetector CT imaging of the abdomen and pelvis was performed using the standard protocol during bolus administration of intravenous contrast. Multiplanar reconstructed  images and MIPs were obtained and reviewed to evaluate the vascular anatomy. RADIATION DOSE REDUCTION: This exam was performed according to the departmental dose-optimization program which includes automated exposure control, adjustment of the mA and/or kV according to patient size and/or use of iterative reconstruction technique. CONTRAST:  OMNIPAQUE  IOHEXOL  350 MG/ML SOLN COMPARISON:  None Available. FINDINGS: VASCULAR Aorta: Normal caliber aorta without aneurysm, dissection, vasculitis or significant stenosis. Celiac: Choose 1 SMA: Choose 1 Renals: Both renal arteries are patent without evidence of aneurysm, dissection, vasculitis, fibromuscular dysplasia or significant stenosis. Accessory right renal artery evident. 2 accessory renal arteries are seen on the left. IMA: Patent without evidence of aneurysm, dissection, vasculitis or significant stenosis. Inflow: Patent without evidence of aneurysm, dissection, vasculitis or significant stenosis. Proximal Outflow: Bilateral common femoral and visualized portions of the superficial and profunda femoral arteries are patent without evidence of aneurysm, dissection, vasculitis or significant stenosis. Veins: No obvious venous abnormality within the limitations of this arterial phase study. Review of the MIP images confirms the above findings. NON-VASCULAR Lower chest: No acute findings. Hepatobiliary: No suspicious focal abnormality within the liver parenchyma. Small area of low attenuation in the anterior liver, adjacent to the falciform ligament, is in a characteristic location for focal fatty deposition. There is no evidence for gallstones, gallbladder wall thickening, or pericholecystic fluid. No intrahepatic or extrahepatic biliary dilation. Pancreas: No focal mass lesion. No dilatation of the main duct. No intraparenchymal cyst. No peripancreatic edema. Spleen: No splenomegaly. No suspicious focal mass lesion. Adrenals/Urinary Tract: No adrenal nodule or  mass. Tiny nonobstructing renal stones bilaterally. No evidence for hydroureter. The urinary bladder appears normal for the degree of distention. Urethral diverticulum evident. Stomach/Bowel: Stomach is unremarkable. No gastric wall thickening. No evidence of outlet obstruction. Duodenum is normally positioned as is the ligament of Treitz. No small bowel wall thickening. No small bowel dilatation. The terminal ileum is normal. The appendix is not well visualized, but there is no edema or inflammation in the region of the cecal tip to  suggest appendicitis. No gross colonic mass. No colonic wall thickening. Lymphatic: No abdominal lymphadenopathy. Upper normal groin lymph nodes are seen bilaterally. Reproductive: The uterus is unremarkable.  There is no adnexal mass. Other: No substantial intraperitoneal free fluid. Musculoskeletal: Low anterior abdominal wall wound again identified. Tiny curvilinear hyperattenuating focus identified along the right margin of the wound (see axial 494/series 10). This persists on the venous imaging but has slightly different configuration without accumulation. Small focus of active arterial phase hemorrhage at this location is not excluded. No discernible feeding vessel. No pre contrast imaging to exclude that this represents a radiopaque foreign body in the wound. No worrisome lytic or sclerotic osseous abnormality. IMPRESSION: 1. Low anterior abdominal wall wound again identified. Tiny curvilinear hyperattenuating focus identified along the right margin of the wound. Small focus of active arterial phase hemorrhage at this location is not excluded. This persists on the venous imaging with slightly different configuration but no accumulation of high density to confirm ongoing bleeding. No discernible feeding vessel. No pre contrast imaging to exclude that this represents a radiopaque foreign body in the wound. 2. Otherwise, no acute findings in the abdomen or pelvis. 3. Tiny  nonobstructing renal stones bilaterally. 4. Urethral diverticulum. Electronically Signed   By: Camellia Candle M.D.   On: 02/12/2024 09:03      Vernal Alstrom, MD  [facility] 02/13/2024  If 7PM-7AM, please contact night-coverage

## 2024-02-13 NOTE — Plan of Care (Signed)

## 2024-02-13 NOTE — Hospital Course (Signed)
 Beverly [name] is a 43 y.o. 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  She also complained of fever tenderness and shortness of breath.  In the ED vitals were notable for hypotension with blood pressure low at 80/50.  Labs were notable  white count of 0.8, hemoglobin 8.2 g/dL and platelets 93.  PT was 15.9 INR 1.2.  CMP showed normal electrolytes after calcium  correction.  CTA GI bleed with no evidence of contrast extravasation into the gastric, duodenum, small bowel or colon lumen.  No acute findings in the abdomen or pelvis.  Left colonic diverticulosis without diverticulitis.  The patient received normal saline 2000 mL bolus, vancomycin  and cefepime  and was considered for admission to hospital for further evaluation and treatment.   Assessment and Plan:    Sepsis due to cellulitis    Bleeding from wound Patient met sepsis criteria with being tachycardic tachypneic with temperature max of 101.3 F with a lower abdominal wound infection.  Lactate was not elevated. Continue vancomycin  and cefepime .  Follow blood cultures.  General surgery has been consulted.  Wound care has been consulted.    Pancytopenia, neutropenia. In the setting of HIV disease.  ID has been consulted.  Currently on antibiotic.  Follow cultures.  Seen by Dr. Vandiver in the past.  Has been started on Bactrim  for pneumocystis prophylaxis.   HIV/AIDS, history of Kaposi's sarcoma, HIV neuropathy (acquired immune deficiency syndrome)  Not on antiretrovirals.  Likely compliance issue due to homelessness     Hypomagnesemia Magnesium  sulfate was replenished.  Check levels in AM.     Anxiety and depression No  suicidal or homicidal intention.  May need treatment.  Tobacco use disorder.

## 2024-02-13 NOTE — Consult Note (Signed)
 CC: pain in wound  Requesting provider: Dr Sonjia  HPI: Beverly [name] is an 43 y.o. female who is here for bleeding from a chronic lower abdominal wall wound.  Patient is homeless with a past medical history of HIV/AIDS not on antiretroviral therapy, anxiety, depression, active tobacco use, history of Kaposi's sarcoma, and an abdominal wall lesion since at least summer 2024.  She states that the wound has progressed over the past year.  She states that she awoke Saturday morning with spontaneous bleeding from the wound.  She states that she has a difficult living situation and is currently homeless.  She states that she does have a dermatology appointment in September.  She is currently not taking any medicines for her HIV/AIDS.  She is pancytopenic.  She does smoke cigarettes and marijuana occasionally.  Otherwise she denies drugs.  She does not really know how the wound started.  We were consulted last summer in July regarding the wound at that time it was in tact essentially.  Pictures of the wound from July 2024 as well as from this admission are below  Past Medical History:  Diagnosis Date   Abscess    AIDS (HCC) 03/19/2015   Anxiety    Asthma    Blood transfusion without reported diagnosis    Depression    HIV infection (HCC)    Homeless 03/19/2015   states stays with family or friends. Does not rent or own a home, but does not live on streets.   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   Skin ulcer (HCC) 05/06/2015    Past Surgical History:  Procedure Laterality Date   CERVICAL CONIZATION W/BX N/A 04/14/2018   Procedure: COLD KNIFE CONIZATION CERVIX WITH BIOPSY;  Surgeon: Anitra Freddy NOVAK, MD;  Location: [facility];  Service: Gynecology;  Laterality: N/A;   CESAREAN SECTION     DILATION AND CURETTAGE OF UTERUS N/A 04/14/2018    Procedure: ENDOCERVICAL CURETTAGE;  Surgeon: Anitra Freddy NOVAK, MD;  Location: [facility];  Service: Gynecology;  Laterality: N/A;   INCISION AND DRAINAGE ABSCESS Right 01/10/2016   Procedure: INCISION AND DRAINAGE RIGHT BUTTOCK, LEFT LABIAL , EXCISION AND DRAINAGE OF  RIGHT AXILLARY ABSCESS;  Surgeon: Morene Olives, MD;  Location: [facility];  Service: General;  Laterality: Right;   TUBAL LIGATION  2005   VULVA MARYBETH BIOPSY N/A 04/14/2018   Procedure: VULVAR BIOPSIES;  Surgeon: Anitra Freddy NOVAK, MD;  Location: [facility];  Service: Gynecology;  Laterality: N/A;    Family History  Problem Relation Age of Onset   Throat cancer Mother        smoker   Throat cancer Father        smoker   Hypertension Father    Cirrhosis Father    Hypertension Other    Cancer Other        smoker   Diabetes Other    Asthma Other     Social:  reports that she has been smoking cigarettes. She has a 0.3 pack-year smoking history. She has never used smokeless tobacco. She reports current alcohol use. She reports current drug use. Frequency: 2.00 times per week. Drugs: Marijuana and MDMA (Ecstacy).  Allergies:  Allergies  Allergen Reactions   Bee Venom Anaphylaxis, Shortness Of Breath and Swelling   Ferrlecit  [Na Ferric Gluc Cplx In Sucrose] Swelling  Swelling and puffiness in hands and legs after Ferrlecit  250 mg infused over 60 minutes (possibly infusion related reaction)   Morphine  And Codeine Hives and Itching    Just can't take morphine , vicodin is OK    Medications: I have reviewed the patient's current medications.   ROS - all of the below systems have been reviewed with the patient and positives are indicated with bold text General: chills, fever or night sweats Eyes: blurry vision or double vision ENT: epistaxis or sore throat Allergy/Immunology: itchy/watery eyes or nasal congestion Hematologic/Lymphatic: bleeding problems, blood clots or swollen lymph  nodes Endocrine: temperature intolerance or unexpected weight changes Breast: new or changing breast lumps or nipple discharge Resp: cough, shortness of breath, or wheezing CV: chest pain or dyspnea on exertion GI: as per HPI GU: dysuria, trouble voiding, or hematuria MSK: joint pain or joint stiffness Neuro: TIA or stroke symptoms Derm: pruritus and skin lesion changes Psych: anxiety and depression  PE Blood pressure 116/83, pulse 88, temperature 98.3 F (36.8 C), resp. rate 19, height 5' 1 (1.549 m), weight 59 kg, SpO2 100%. Constitutional: NAD; conversant; no deformities Eyes: Moist conjunctiva; no lid lag; anicteric; PERRL Neck: Trachea midline; no thyromegaly Lungs: Normal respiratory effort; no tactile fremitus CV: RRR; no palpable thrills; no pitting edema GI: Abd soft, very tender around the actual wound; no palpable hepatosplenomegaly; lower abdominal wall Pfannenstiel wound measures about 12 cm horizontal by about 2-1/2 cm vertical with probably about 2 cm of depth.  There is some superficial fat necrosis.  There is no active bleeding.  There is some atypical appearing fat.  MSK:  no clubbing/cyanosis Psychiatric: Appropriate affect; alert and oriented x3 Lymphatic: No palpable cervical or axillary lymphadenopathy Skin: See above  WOUND JULY 2024     Results for orders placed or performed during the hospital encounter of 02/12/24 (from the past 48 hours)  Blood Culture (routine x 2)     Status: None (Preliminary result)   Collection Time: 02/12/24  6:40 AM   Specimen: BLOOD  Result Value Ref Range   Specimen Description      BLOOD LEFT ANTECUBITAL Performed at [facility], [address] [place], [place] [address]    Special Requests      BOTTLES DRAWN AEROBIC AND ANAEROBIC Blood Culture adequate volume Performed at [facility], [address] [place], [place] [address]    Culture      NO GROWTH < 24 HOURS Performed at [facility], [address], [place], [place] [address]    Report Status PENDING   Resp panel by RT-PCR (RSV, Flu A&B, Covid) Anterior Nasal Swab     Status: None   Collection Time: 02/12/24  6:50 AM   Specimen: Anterior Nasal Swab  Result Value Ref Range   SARS Coronavirus 2 by RT PCR NEGATIVE NEGATIVE    Comment: (NOTE) SARS-CoV-2 target nucleic acids are NOT DETECTED.  The SARS-CoV-2 RNA is generally detectable in upper respiratory specimens during the acute phase of infection. The lowest concentration of SARS-CoV-2 viral copies this assay can detect is 138 copies/mL. A negative result does not preclude SARS-Cov-2 infection and should not be used as the sole basis for treatment or other patient management decisions. A negative result may occur with  improper specimen collection/handling, submission of specimen other than nasopharyngeal swab, presence of viral mutation(s) within the areas targeted by this assay, and inadequate number of viral copies(<138 copies/mL). A negative result  must be combined with clinical observations, patient history, and epidemiological information. The expected result is Negative.  Fact Sheet for Patients:  BloggerCourse.com  Fact Sheet for Healthcare Providers:  SeriousBroker.it  This test is no t yet approved or cleared by the [place]  FDA and  has been authorized for detection and/or diagnosis of SARS-CoV-2 by FDA under an Emergency Use Authorization (EUA). This EUA will remain  in effect (meaning this test can be used) for the duration of the COVID-19 declaration under Section 564(b)(1) of the Act, 21 U.S.C.section 360bbb-3(b)(1), unless the authorization is terminated  or revoked sooner.       Influenza A by PCR NEGATIVE NEGATIVE   Influenza B by PCR NEGATIVE NEGATIVE    Comment: (NOTE) The Xpert Xpress SARS-CoV-2/FLU/RSV plus assay is intended as an aid in the diagnosis of influenza from  Nasopharyngeal swab specimens and should not be used as a sole basis for treatment. Nasal washings and aspirates are unacceptable for Xpert Xpress SARS-CoV-2/FLU/RSV testing.  Fact Sheet for Patients: BloggerCourse.com  Fact Sheet for Healthcare Providers: SeriousBroker.it  This test is not yet approved or cleared by the [place]  FDA and has been authorized for detection and/or diagnosis of SARS-CoV-2 by FDA under an Emergency Use Authorization (EUA). This EUA will remain in effect (meaning this test can be used) for the duration of the COVID-19 declaration under Section 564(b)(1) of the Act, 21 U.S.C. section 360bbb-3(b)(1), unless the authorization is terminated or revoked.     Resp Syncytial Virus by PCR NEGATIVE NEGATIVE    Comment: (NOTE) Fact Sheet for Patients: BloggerCourse.com  Fact Sheet for Healthcare Providers: SeriousBroker.it  This test is not yet approved or cleared by the [place]  FDA and has been authorized for detection and/or diagnosis of SARS-CoV-2 by FDA under an Emergency Use Authorization (EUA). This EUA will remain in effect (meaning this test can be used) for the duration of the COVID-19 declaration under Section 564(b)(1) of the Act, 21 U.S.C. section 360bbb-3(b)(1), unless the authorization is terminated or revoked.  Performed at [facility], [address] [place], [place] [address]   Lactic acid, plasma     Status: None   Collection Time: 02/12/24  6:50 AM  Result Value Ref Range   Lactic Acid, Venous 1.1 0.5 - 1.9 mmol/L    Comment: Performed at [facility], [address] [place], [place] [address]  Comprehensive metabolic panel     Status: Abnormal   Collection Time: 02/12/24  6:50 AM  Result Value Ref Range   Sodium 135 135 - 145 mmol/L   Potassium 3.5 3.5 - 5.1 mmol/L   Chloride 103 98 - 111 mmol/L    CO2 21 (L) 22 - 32 mmol/L   Glucose, Bld 98 70 - 99 mg/dL    Comment: Glucose reference range applies only to samples taken after fasting for at least 8 hours.   BUN 12 6 - 20 mg/dL   Creatinine, Ser 9.19 0.44 - 1.00 mg/dL   Calcium  7.8 (L) 8.9 - 10.3 mg/dL   Total Protein 8.0 6.5 - 8.1 g/dL   Albumin 2.9 (L) 3.5 - 5.0 g/dL   AST 40 15 - 41 U/L   ALT 11 0 - 44 U/L   Alkaline Phosphatase 116 38 - 126 U/L   Total Bilirubin 0.7 0.0 - 1.2 mg/dL   GFR, Estimated >39 >39 mL/min    Comment: (NOTE) Calculated using the CKD-EPI Creatinine Equation (2021)  Anion gap 11 5 - 15    Comment: Performed at [facility], [address] [place], [place] [address]  CBC with Differential     Status: Abnormal   Collection Time: 02/12/24  6:50 AM  Result Value Ref Range   WBC 0.8 (LL) 4.0 - 10.5 K/uL    Comment: REPEATED TO VERIFY This critical result has been called to [employer] civil engineer by Talbot Andrea on 02/12/2024 07:36:37, and has been read back.    RBC 3.32 (L) 3.87 - 5.11 MIL/uL   Hemoglobin 8.2 (L) 12.0 - 15.0 g/dL   HCT 73.4 (L) 63.9 - 53.9 %   MCV 79.8 (L) 80.0 - 100.0 fL   MCH 24.7 (L) 26.0 - 34.0 pg   MCHC 30.9 30.0 - 36.0 g/dL   RDW 83.6 (H) 88.4 - 84.4 %   Platelets 93 (L) 150 - 400 K/uL    Comment: REPEATED TO VERIFY PLATELET COUNT CONFIRMED BY SMEAR Immature Platelet Fraction may be clinically indicated, consider ordering this additional test [id number]    nRBC 0.0 0.0 - 0.2 %   Neutrophils Relative % 31 %   Neutro Abs 0.3 (LL) 1.7 - 7.7 K/uL    Comment: REPEATED TO VERIFY This critical result has been called to [employer] Sallye Consulting civil engineer by Terran Tysor on 02/12/2024 07:37:43, and has been read back.    Lymphocytes Relative 32 %   Lymphs Abs 0.3 (L) 0.7 - 4.0 K/uL   Monocytes Relative 26 %   Monocytes Absolute 0.2 0.1 - 1.0 K/uL   Eosinophils Relative 5 %   Eosinophils Absolute 0.0 0.0 - 0.5 K/uL   Basophils Relative 1 %   Basophils Absolute 0.0 0.0 - 0.1 K/uL    WBC Morphology MORPHOLOGY UNREMARKABLE    Smear Review Normal platelet morphology     Comment: PLATELETS APPEAR DECREASED   Immature Granulocytes 5 %   Abs Immature Granulocytes 0.04 0.00 - 0.07 K/uL   Ovalocytes PRESENT     Comment: Performed at [facility], [address] [place], [place] [address]  Protime-INR     Status: None   Collection Time: 02/12/24  6:50 AM  Result Value Ref Range   Prothrombin Time 14.5 11.4 - 15.2 seconds   INR 1.1 0.8 - 1.2    Comment: (NOTE) INR goal varies based on device and disease states. Performed at [facility], [address] [place], [place] [address]   hCG, serum, qualitative     Status: None   Collection Time: 02/12/24  6:50 AM  Result Value Ref Range   Preg, Serum NEGATIVE NEGATIVE    Comment:        THE SENSITIVITY OF THIS METHODOLOGY IS >10 mIU/mL. Performed at [facility], [address], [place], [place] [address]   Magnesium      Status: Abnormal   Collection Time: 02/12/24  6:50 AM  Result Value Ref Range   Magnesium  1.5 (L) 1.7 - 2.4 mg/dL    Comment: Performed at [facility], [address] [place], [place] [address]  Phosphorus     Status: None   Collection Time: 02/12/24  6:50 AM  Result Value Ref Range   Phosphorus 3.0 2.5 - 4.6 mg/dL    Comment: Performed at [facility], [address] [place], [place] [address]  Blood Culture (routine x 2)     Status: None (Preliminary result)  Collection Time: 02/12/24  6:52 AM   Specimen: BLOOD  Result Value Ref Range   Specimen Description      BLOOD RIGHT ANTECUBITAL Performed at [facility], [address] [place], [place] [address]    Special Requests      BOTTLES DRAWN AEROBIC AND ANAEROBIC Blood Culture adequate volume Performed at [facility], [address] [place], [place] [address]    Culture      NO GROWTH < 24 HOURS Performed at [facility], [address],  [place], [place] [address]    Report Status PENDING   Type and screen [facility]     Status: None   Collection Time: 02/12/24  1:53 PM  Result Value Ref Range   ABO/RH(D) A POS    Antibody Screen NEG    Sample Expiration      02/15/2024,2359 Performed at [facility], [address], [place], [place] [address]   Hemoglobin and hematocrit, blood     Status: Abnormal   Collection Time: 02/12/24  1:53 PM  Result Value Ref Range   Hemoglobin 7.4 (L) 12.0 - 15.0 g/dL   HCT 73.8 (L) 63.9 - 53.9 %    Comment: Performed at [facility], [address] [place], [place] [address]  Hemoglobin and hematocrit, blood     Status: Abnormal   Collection Time: 02/12/24  6:57 PM  Result Value Ref Range   Hemoglobin 7.0 (L) 12.0 - 15.0 g/dL   HCT 76.1 (L) 63.9 - 53.9 %    Comment: Performed at [facility], [address] [place], [place] [address]    CT Angio Abd/Pel W and/or Wo Contrast Result Date: 02/12/2024 CLINICAL DATA:  Bleeding wound under the pannus of the suprapubic area. EXAM: CTA ABDOMEN AND PELVIS WITHOUT AND WITH CONTRAST TECHNIQUE: Multidetector CT imaging of the abdomen and pelvis was performed using the standard protocol during bolus administration of intravenous contrast. Multiplanar reconstructed images and MIPs were obtained and reviewed to evaluate the vascular anatomy. RADIATION DOSE REDUCTION: This exam was performed according to the departmental dose-optimization program which includes automated exposure control, adjustment of the mA and/or kV according to patient size and/or use of iterative reconstruction technique. CONTRAST:  OMNIPAQUE  IOHEXOL  350 MG/ML SOLN COMPARISON:  None Available. FINDINGS: VASCULAR Aorta: Normal caliber aorta without aneurysm, dissection, vasculitis or significant stenosis. Celiac: Choose 1 SMA: Choose 1 Renals: Both renal arteries are patent without evidence of aneurysm,  dissection, vasculitis, fibromuscular dysplasia or significant stenosis. Accessory right renal artery evident. 2 accessory renal arteries are seen on the left. IMA: Patent without evidence of aneurysm, dissection, vasculitis or significant stenosis. Inflow: Patent without evidence of aneurysm, dissection, vasculitis or significant stenosis. Proximal Outflow: Bilateral common femoral and visualized portions of the superficial and profunda femoral arteries are patent without evidence of aneurysm, dissection, vasculitis or significant stenosis. Veins: No obvious venous abnormality within the limitations of this arterial phase study. Review of the MIP images confirms the above findings. NON-VASCULAR Lower chest: No acute findings. Hepatobiliary: No suspicious focal abnormality within the liver parenchyma. Small area of low attenuation in the anterior liver, adjacent to the falciform ligament, is in a characteristic location for focal fatty deposition. There is no evidence for gallstones, gallbladder wall thickening, or pericholecystic fluid. No intrahepatic or extrahepatic biliary dilation. Pancreas: No focal mass lesion. No dilatation of the main duct. No intraparenchymal cyst. No peripancreatic edema. Spleen:  No splenomegaly. No suspicious focal mass lesion. Adrenals/Urinary Tract: No adrenal nodule or mass. Tiny nonobstructing renal stones bilaterally. No evidence for hydroureter. The urinary bladder appears normal for the degree of distention. Urethral diverticulum evident. Stomach/Bowel: Stomach is unremarkable. No gastric wall thickening. No evidence of outlet obstruction. Duodenum is normally positioned as is the ligament of Treitz. No small bowel wall thickening. No small bowel dilatation. The terminal ileum is normal. The appendix is not well visualized, but there is no edema or inflammation in the region of the cecal tip to suggest appendicitis. No gross colonic mass. No colonic wall thickening. Lymphatic: No  abdominal lymphadenopathy. Upper normal groin lymph nodes are seen bilaterally. Reproductive: The uterus is unremarkable.  There is no adnexal mass. Other: No substantial intraperitoneal free fluid. Musculoskeletal: Low anterior abdominal wall wound again identified. Tiny curvilinear hyperattenuating focus identified along the right margin of the wound (see axial 494/series 10). This persists on the venous imaging but has slightly different configuration without accumulation. Small focus of active arterial phase hemorrhage at this location is not excluded. No discernible feeding vessel. No pre contrast imaging to exclude that this represents a radiopaque foreign body in the wound. No worrisome lytic or sclerotic osseous abnormality. IMPRESSION: 1. Low anterior abdominal wall wound again identified. Tiny curvilinear hyperattenuating focus identified along the right margin of the wound. Small focus of active arterial phase hemorrhage at this location is not excluded. This persists on the venous imaging with slightly different configuration but no accumulation of high density to confirm ongoing bleeding. No discernible feeding vessel. No pre contrast imaging to exclude that this represents a radiopaque foreign body in the wound. 2. Otherwise, no acute findings in the abdomen or pelvis. 3. Tiny nonobstructing renal stones bilaterally. 4. Urethral diverticulum. Electronically Signed   By: Camellia Candle M.D.   On: 02/12/2024 09:03    Imaging: Personally reviewed  A/P: Beverly [name] is an 43 y.o. female with  AIDS Open Pfannenstiel wound of unknown etiology Pancytopenia Anxiety depression  This wound started over a year ago and her old Pfannenstiel incision.  At that time the skin was essentially intact but had exophytic components to it.  We were not able to do a biopsy at that time.  Appears patient's social situation complicated her outpatient follow-up with dermatology the wound has now progressed and  there is a deep open wound.  Etiology is still unclear  I think it needs to be biopsied and have some gentle debridement.  Unfortunately this wound will not be able to be closed.  Right now we will tentatively plan incisional biopsy and excisional debridement in the OR tomorrow with Dr. Curvin pending OR availability  N.p.o. after midnight -type and screen if not already done so Recommend ID consult Recommend TOC consult given her social situation and need to be on medications for her HIV/AIDS   Data reviewed: ED notes, H&P from the hospitalist, progress note from the hospitalist, wound care note, general surgery consult note from July 2024, outpatient PCP note from August 2024, discharge summary from July 2024;  labs for the past several months  High mdm  Camellia HERO. Tanda, MD, FACS General, Bariatric, & Minimally Invasive Surgery [facility]

## 2024-02-14 ENCOUNTER — Inpatient Hospital Stay (HOSPITAL_COMMUNITY): Admitting: Certified Registered"

## 2024-02-14 ENCOUNTER — Encounter (HOSPITAL_COMMUNITY)
Admission: EM | Disposition: A | Discharge status: Home / Self Care | Payer: Self-pay | Source: Home / Self Care | Attending: Internal Medicine

## 2024-02-14 ENCOUNTER — Encounter (HOSPITAL_COMMUNITY): Payer: Self-pay | Admitting: *Deleted

## 2024-02-14 DIAGNOSIS — J45909 Unspecified asthma, uncomplicated: Secondary | ICD-10-CM

## 2024-02-14 DIAGNOSIS — F17210 Nicotine dependence, cigarettes, uncomplicated: Secondary | ICD-10-CM | POA: Diagnosis not present

## 2024-02-14 DIAGNOSIS — A419 Sepsis, unspecified organism: Secondary | ICD-10-CM | POA: Diagnosis not present

## 2024-02-14 DIAGNOSIS — S31109A Unspecified open wound of abdominal wall, unspecified quadrant without penetration into peritoneal cavity, initial encounter: Secondary | ICD-10-CM

## 2024-02-14 DIAGNOSIS — L0390 Cellulitis, unspecified: Secondary | ICD-10-CM | POA: Diagnosis not present

## 2024-02-14 DIAGNOSIS — F418 Other specified anxiety disorders: Secondary | ICD-10-CM | POA: Diagnosis not present

## 2024-02-14 HISTORY — PX: BIOPSY OF SKIN SUBCUTANEOUS TISSUE AND/OR MUCOUS MEMBRANE: SHX6741

## 2024-02-14 LAB — CD4/CD8 (T-HELPER/T-SUPPRESSOR CELL)
CD4 absolute: <35 /uL — ABNORMAL LOW (ref 400–1790)
CD4%: 1 % — ABNORMAL LOW (ref 33–65)
CD8 T Cell Abs: <45 /uL — ABNORMAL LOW (ref 190–1000)
CD8tox: 38.46 % (ref 12–40)
Ratio: 0.03 — ABNORMAL LOW (ref 1.0–3.0)
Total lymphocyte count: 69 /uL — ABNORMAL LOW (ref 1000–4000)

## 2024-02-14 LAB — TYPE AND SCREEN
ABO/RH(D): A POS
Antibody Screen: NEGATIVE
Unit division: 0

## 2024-02-14 LAB — BPAM RBC
Blood Product Expiration Date: 202509202359
ISSUE DATE / TIME: 202508241251
Unit Type and Rh: 6200

## 2024-02-14 LAB — COMPREHENSIVE METABOLIC PANEL WITH GFR
ALT: 8 U/L (ref 0–44)
AST: 17 U/L (ref 15–41)
Albumin: 1.7 g/dL — ABNORMAL LOW (ref 3.5–5.0)
Alkaline Phosphatase: 72 U/L (ref 38–126)
Anion gap: 4 — ABNORMAL LOW (ref 5–15)
BUN: 14 mg/dL (ref 6–20)
CO2: 18 mmol/L — ABNORMAL LOW (ref 22–32)
Calcium: 7.2 mg/dL — ABNORMAL LOW (ref 8.9–10.3)
Chloride: 111 mmol/L (ref 98–111)
Creatinine, Ser: 0.81 mg/dL (ref 0.44–1.00)
GFR, Estimated: >60 mL/min (ref >60)
Glucose, Bld: 84 mg/dL (ref 70–99)
Potassium: 3.5 mmol/L (ref 3.5–5.1)
Sodium: 133 mmol/L — ABNORMAL LOW (ref 135–145)
Total Bilirubin: 0.6 mg/dL (ref 0.0–1.2)
Total Protein: 6 g/dL — ABNORMAL LOW (ref 6.5–8.1)

## 2024-02-14 LAB — CBC WITH DIFFERENTIAL/PLATELET
HCT: 29.7 % — ABNORMAL LOW (ref 36.0–46.0)
Hemoglobin: 8.9 g/dL — ABNORMAL LOW (ref 12.0–15.0)
MCH: 25 pg — ABNORMAL LOW (ref 26.0–34.0)
MCHC: 30 g/dL (ref 30.0–36.0)
MCV: 83.4 fL (ref 80.0–100.0)
Platelets: 71 K/uL — ABNORMAL LOW (ref 150–400)
RBC: 3.56 MIL/uL — ABNORMAL LOW (ref 3.87–5.11)
RDW: 16.3 % — ABNORMAL HIGH (ref 11.5–15.5)
Smear Review: NORMAL
WBC: 0.9 K/uL — CL (ref 4.0–10.5)
nRBC: 0 % (ref 0.0–0.2)

## 2024-02-14 LAB — RPR: RPR Ser Ql: NONREACTIVE

## 2024-02-14 LAB — PHOSPHORUS: Phosphorus: 2.9 mg/dL (ref 2.5–4.6)

## 2024-02-14 LAB — MAGNESIUM: Magnesium: 1.7 mg/dL (ref 1.7–2.4)

## 2024-02-14 SURGERY — BIOPSY, SKIN, SUBCUTANEOUS TISSUE, OR MUCOUS MEMBRANE
Anesthesia: General | Site: Abdomen

## 2024-02-14 MED ORDER — OXYCODONE HCL 5 MG PO TABS
ORAL_TABLET | ORAL | Status: AC
  Filled 2024-02-14: qty 1

## 2024-02-14 MED ORDER — DEXAMETHASONE SODIUM PHOSPHATE 10 MG/ML IJ SOLN
INTRAMUSCULAR | Status: AC
  Filled 2024-02-14: qty 1

## 2024-02-14 MED ORDER — LACTATED RINGERS IV SOLN: INTRAVENOUS | Status: DC

## 2024-02-14 MED ORDER — SUGAMMADEX SODIUM 200 MG/2ML IV SOLN
INTRAVENOUS | Status: AC
  Filled 2024-02-14: qty 2

## 2024-02-14 MED ORDER — ONDANSETRON HCL 4 MG/2ML IJ SOLN
INTRAMUSCULAR | Status: AC
  Filled 2024-02-14: qty 2

## 2024-02-14 MED ORDER — SODIUM CHLORIDE 0.9 % IR SOLN
Status: DC | PRN
  Administered 2024-02-14: 1000 mL

## 2024-02-14 MED ORDER — BICTEGRAVIR-EMTRICITAB-TENOFOV 50-200-25 MG PO TABS
1.0000 | ORAL_TABLET | Freq: Every day | ORAL | Status: DC
  Administered 2024-02-14 – 2024-02-17 (×4): 1 via ORAL
  Filled 2024-02-14 (×4): qty 1

## 2024-02-14 MED ORDER — MIDAZOLAM HCL 2 MG/2ML IJ SOLN
INTRAMUSCULAR | Status: DC | PRN
  Administered 2024-02-14: 2 mg via INTRAVENOUS

## 2024-02-14 MED ORDER — OXYCODONE HCL 5 MG PO TABS
5.0000 mg | ORAL_TABLET | Freq: Once | ORAL | Status: AC | PRN
  Administered 2024-02-14: 5 mg via ORAL

## 2024-02-14 MED ORDER — SODIUM CHLORIDE 0.9 % IV SOLN: 12.5000 mg | INTRAVENOUS | Status: DC | PRN

## 2024-02-14 MED ORDER — LIDOCAINE HCL (PF) 2 % IJ SOLN
INTRAMUSCULAR | Status: AC
  Filled 2024-02-14: qty 5

## 2024-02-14 MED ORDER — PROPOFOL 10 MG/ML IV BOLUS
INTRAVENOUS | Status: DC | PRN
  Administered 2024-02-14: 110 mg via INTRAVENOUS

## 2024-02-14 MED ORDER — AMISULPRIDE (ANTIEMETIC) 5 MG/2ML IV SOLN
10.0000 mg | Freq: Once | INTRAVENOUS | Status: DC | PRN
Start: 2024-02-14 — End: 2024-02-14

## 2024-02-14 MED ORDER — ROCURONIUM BROMIDE 10 MG/ML (PF) SYRINGE
PREFILLED_SYRINGE | INTRAVENOUS | Status: AC
  Filled 2024-02-14: qty 10

## 2024-02-14 MED ORDER — CHLORHEXIDINE GLUCONATE 0.12 % MT SOLN
15.0000 mL | Freq: Once | OROMUCOSAL | Status: AC
  Administered 2024-02-14: 15 mL via OROMUCOSAL

## 2024-02-14 MED ORDER — ROCURONIUM BROMIDE 10 MG/ML (PF) SYRINGE
PREFILLED_SYRINGE | INTRAVENOUS | Status: DC | PRN
  Administered 2024-02-14: 40 mg via INTRAVENOUS

## 2024-02-14 MED ORDER — MIDAZOLAM HCL 2 MG/2ML IJ SOLN
INTRAMUSCULAR | Status: AC
Start: 2024-02-14 — End: 2024-02-14
  Filled 2024-02-14: qty 2

## 2024-02-14 MED ORDER — BUPIVACAINE-EPINEPHRINE (PF) 0.25% -1:200000 IJ SOLN
INTRAMUSCULAR | Status: AC
  Filled 2024-02-14: qty 30

## 2024-02-14 MED ORDER — ONDANSETRON HCL 4 MG/2ML IJ SOLN
INTRAMUSCULAR | Status: DC | PRN
  Administered 2024-02-14: 4 mg via INTRAVENOUS

## 2024-02-14 MED ORDER — HYDROMORPHONE HCL 1 MG/ML IJ SOLN
0.2500 mg | INTRAMUSCULAR | Status: DC | PRN
  Administered 2024-02-14: 0.5 mg via INTRAVENOUS

## 2024-02-14 MED ORDER — SUGAMMADEX SODIUM 200 MG/2ML IV SOLN
INTRAVENOUS | Status: DC | PRN
  Administered 2024-02-14: 200 mg via INTRAVENOUS

## 2024-02-14 MED ORDER — DEXAMETHASONE SODIUM PHOSPHATE 10 MG/ML IJ SOLN
INTRAMUSCULAR | Status: DC | PRN
  Administered 2024-02-14: 5 mg via INTRAVENOUS

## 2024-02-14 MED ORDER — FENTANYL CITRATE (PF) 100 MCG/2ML IJ SOLN
INTRAMUSCULAR | Status: AC
Start: 2024-02-14 — End: 2024-02-14
  Filled 2024-02-14: qty 2

## 2024-02-14 MED ORDER — LIDOCAINE HCL (CARDIAC) PF 100 MG/5ML IV SOSY
PREFILLED_SYRINGE | INTRAVENOUS | Status: DC | PRN
  Administered 2024-02-14: 40 mg via INTRAVENOUS

## 2024-02-14 MED ORDER — PROPOFOL 10 MG/ML IV BOLUS
INTRAVENOUS | Status: AC
  Filled 2024-02-14: qty 20

## 2024-02-14 MED ORDER — HYDROMORPHONE HCL 1 MG/ML IJ SOLN
INTRAMUSCULAR | Status: AC
  Filled 2024-02-14: qty 1

## 2024-02-14 MED ORDER — OXYCODONE HCL 5 MG/5ML PO SOLN: 5.0000 mg | Freq: Once | ORAL | Status: AC | PRN

## 2024-02-14 MED ORDER — FENTANYL CITRATE (PF) 100 MCG/2ML IJ SOLN
INTRAMUSCULAR | Status: DC | PRN
  Administered 2024-02-14: 50 ug via INTRAVENOUS

## 2024-02-14 SURGICAL SUPPLY — 31 items
BAG COUNTER SPONGE SURGICOUNT (BAG) IMPLANT
BENZOIN TINCTURE PRP APPL 2/3 (GAUZE/BANDAGES/DRESSINGS) IMPLANT
BLADE HEX COATED 2.75 (ELECTRODE) ×3 IMPLANT
BLADE SURG SZ10 CARB STEEL (BLADE) ×5 IMPLANT
DRAPE LAPAROTOMY T 102X78X121 (DRAPES) IMPLANT
DRAPE LAPAROTOMY TRNSV 102X78 (DRAPES) IMPLANT
DRAPE SHEET LG 3/4 BI-LAMINATE (DRAPES) IMPLANT
ELECT REM PT RETURN 15FT ADLT (MISCELLANEOUS) ×3 IMPLANT
EVACUATOR SILICONE 100CC (DRAIN) IMPLANT
GAUZE SPONGE 4X4 12PLY STRL (GAUZE/BANDAGES/DRESSINGS) ×4 IMPLANT
GLOVE BIO SURGEON STRL SZ7.5 (GLOVE) ×6 IMPLANT
GLOVE BIOGEL PI IND STRL 7.0 (GLOVE) ×3 IMPLANT
GOWN STRL REUS W/ TWL LRG LVL3 (GOWN DISPOSABLE) ×3 IMPLANT
GOWN STRL REUS W/ TWL XL LVL3 (GOWN DISPOSABLE) ×3 IMPLANT
KIT BASIN OR (CUSTOM PROCEDURE TRAY) ×3 IMPLANT
KIT TURNOVER KIT A (KITS) ×3 IMPLANT
MARKER SKIN DUAL TIP RULER LAB (MISCELLANEOUS) ×1 IMPLANT
NDL HYPO 25X1 1.5 SAFETY (NEEDLE) ×2 IMPLANT
NEEDLE HYPO 25X1 1.5 SAFETY (NEEDLE) IMPLANT
NS IRRIG 1000ML POUR BTL (IV SOLUTION) ×2 IMPLANT
PACK BASIC VI WITH GOWN DISP (CUSTOM PROCEDURE TRAY) ×3 IMPLANT
PENCIL SMOKE EVACUATOR (MISCELLANEOUS) IMPLANT
SOL PREP POV-IOD 4OZ 10% (MISCELLANEOUS) ×3 IMPLANT
SPIKE FLUID TRANSFER (MISCELLANEOUS) IMPLANT
SPONGE T-LAP 4X18 ~~LOC~~+RFID (SPONGE) ×3 IMPLANT
STAPLER SKIN PROX 35W (STAPLE) IMPLANT
SUT MNCRL AB 4-0 PS2 18 (SUTURE) IMPLANT
SUT VIC AB 3-0 SH 18 (SUTURE) IMPLANT
SYR CONTROL 10ML LL (SYRINGE) ×2 IMPLANT
TOWEL OR 17X26 10 PK STRL BLUE (TOWEL DISPOSABLE) ×3 IMPLANT
WATER STERILE IRR 1000ML POUR (IV SOLUTION) ×2 IMPLANT

## 2024-02-14 NOTE — Anesthesia Preprocedure Evaluation (Signed)
 Anesthesia Evaluation  Patient identified by MRN, date of birth, ID band Patient awake    Reviewed: Allergy & Precautions, NPO status , [name] Chart, lab work & pertinent test results  Airway Mallampati: II  TM Distance: >3 FB Neck ROM: Full    Dental  (+) Dental Advisory Given   Pulmonary asthma , Current Smoker and Patient abstained from smoking.   breath sounds clear to auscultation       Cardiovascular negative cardio ROS  Rhythm:Regular Rate:Normal     Neuro/Psych   Anxiety Depression    negative neurological ROS     GI/Hepatic negative GI ROS, Neg liver ROS,,,  Endo/Other  negative endocrine ROS    Renal/GU negative Renal ROS     Musculoskeletal   Abdominal   Peds  Hematology  (+) Blood dyscrasia, anemia , HIV  Anesthesia Other Findings   Reproductive/Obstetrics                              Anesthesia Physical Anesthesia Plan  ASA: 4  Anesthesia Plan: General   Post-op Pain Management:    Induction: Intravenous and Rapid sequence  PONV Risk Score and Plan: 2 and Ondansetron , Treatment may vary due to age or medical condition and Midazolam   Airway Management Planned: Oral ETT  Additional Equipment:   Intra-op Plan:   Post-operative Plan: Extubation in OR  Informed Consent: I have reviewed the patients History and Physical, chart, labs and discussed the procedure including the risks, benefits and alternatives for the proposed anesthesia with the patient or authorized representative who has indicated his/her understanding and acceptance.     Dental advisory given  Plan Discussed with: CRNA  Anesthesia Plan Comments:          Anesthesia Quick Evaluation

## 2024-02-14 NOTE — Interval H&P Note (Signed)
 History and Physical Interval Note:  02/14/2024 2:07 PM  [name]  has presented today for surgery, with the diagnosis of ABDOMINAL WOUND.  The various methods of treatment have been discussed with the patient and family. After consideration of risks, benefits and other options for treatment, the patient has consented to  Procedure(s): IRRIGATION AND DEBRIDEMENT WOUND (N/A) as a surgical intervention.  The patient's history has been reviewed, patient examined, no change in status, stable for surgery.  I have reviewed the patient's chart and labs.  Questions were answered to the patient's satisfaction.     Beverly [name]

## 2024-02-14 NOTE — H&P (View-Only) (Signed)
 Progress Note  * Day of Surgery *  Subjective: Pt reports pain from wound but no active bleeding.   Objective: Vital signs in last 24 hours: Temp:  [98 F (36.7 C)-99.5 F (37.5 C)] 99.5 F (37.5 C) (08/25 0515) Pulse Rate:  [89-102] 91 (08/25 0515) Resp:  [16-20] 20 (08/25 0515) BP: (107-113)/(69-80) 113/78 (08/25 0515) SpO2:  [99 %-100 %] 99 % (08/25 0515) Last BM Date : 02/12/24  Intake/Output from previous day: 08/24 0701 - 08/25 0700 In: 784.6 [I.V.:19.7; Blood:315; IV Piggyback:449.9] Out: -  Intake/Output this shift: No intake/output data recorded.  PE: General: pleasant, WD, thin female who is laying in bed in NAD Heart: regular, rate, and rhythm.   Lungs: Respiratory effort nonlabored Abd: soft, Pfannenstiel wound without active bleeding but very tender to palpation, remains open Psych: A&Ox3 with an appropriate affect.    Lab Results:  Recent Labs    02/12/24 0650 02/12/24 1353 02/13/24 1038 02/14/24 0444  WBC 0.8*  --   --  0.9*  HGB 8.2*   < > 6.7* 8.9*  HCT 26.5*   < > 22.8* 29.7*  PLT 93*  --   --  71*   < > = values in this interval not displayed.   BMET Recent Labs    02/12/24 0650 02/14/24 0444  NA 135 133*  K 3.5 3.5  CL 103 111  CO2 21* 18*  GLUCOSE 98 84  BUN 12 14  CREATININE 0.80 0.81  CALCIUM  7.8* 7.2*   PT/INR Recent Labs    02/12/24 0650  LABPROT 14.5  INR 1.1   CMP     Component Value Date/Time   NA 133 (L) 02/14/2024 0444   K 3.5 02/14/2024 0444   CL 111 02/14/2024 0444   CO2 18 (L) 02/14/2024 0444   GLUCOSE 84 02/14/2024 0444   BUN 14 02/14/2024 0444   CREATININE 0.81 02/14/2024 0444   CREATININE 0.65 01/22/2022 1015   CALCIUM  7.2 (L) 02/14/2024 0444   PROT 6.0 (L) 02/14/2024 0444   ALBUMIN 1.7 (L) 02/14/2024 0444   AST 17 02/14/2024 0444   ALT 8 02/14/2024 0444   ALKPHOS 72 02/14/2024 0444   BILITOT 0.6 02/14/2024 0444   GFRNONAA >60 02/14/2024 0444   GFRNONAA 114 12/26/2019 1215   GFRAA 132  12/26/2019 1215   Lipase     Component Value Date/Time   LIPASE 18 11/22/2023 1516       Studies/Results: No results found.  Anti-infectives: Anti-infectives (From admission, onward)    Start     Dose/Rate Route Frequency Ordered Stop   02/13/24 1000  vancomycin  (VANCOREADY) IVPB 1250 mg/250 mL        1,250 mg 166.7 mL/hr over 90 Minutes Intravenous Every 24 hours 02/12/24 1119     02/12/24 1530  sulfamethoxazole -trimethoprim  (BACTRIM ) 400-80 MG per tablet 1 tablet        1 tablet Oral Daily 02/12/24 1441     02/12/24 1400  ceFEPIme  (MAXIPIME ) 2 g in sodium chloride  0.9 % 100 mL IVPB        2 g 200 mL/hr over 30 Minutes Intravenous Every 8 hours 02/12/24 1119     02/12/24 1000  vancomycin  (VANCOCIN ) IVPB 1000 mg/200 mL premix        1,000 mg 200 mL/hr over 60 Minutes Intravenous  Once 02/12/24 0951 02/12/24 1111   02/12/24 0645  cefTRIAXone  (ROCEPHIN ) 2 g in sodium chloride  0.9 % 100 mL IVPB  2 g 200 mL/hr over 30 Minutes Intravenous Once 02/12/24 9360 02/12/24 0737        Assessment/Plan Hx of open Pfannenstiel wound of unknown etiology  Bleeding from wound and poor healing - recommend gentle debridement and biopsy in OR today  - will update wound care recs post-op  - discussed with patient plans for OR today barring any surgical emergencies and she is agreement   FEN: NPO, IVF per [facility] VTE: SCDs ID:Abx per ID  - per [facility] -  AIDS Pancytopenia  Anxiety/depression   LOS: 2 days   I reviewed Consultant ID notes, hospitalist notes, last 24 h vitals and pain scores, last 48 h intake and output, last 24 h labs and trends, and last 24 h imaging results.  This care required high  level of medical decision making.    Burnard JONELLE Louder, [facility] 02/14/2024, 9:39 AM Please see Amion for pager number during day hours 7:00am-4:30pm

## 2024-02-14 NOTE — Progress Notes (Signed)
 PROGRESS NOTE  [name] FMW:[id number] DOB: Jul 07, 1980 DOA: 02/12/2024 PCP: Patient, No Pcp Per   LOS: 2 days   Brief narrative:   Beverly [name] is a 43 y.o. female with past medical history significant of HIV infection/AIDS not on antiretroviral therapy, anxiety, depression, asthma, active tobacco abuse, homelessness, Kaposi's sarcoma,  HIV neuropathy, chronic lower abdominal wall wound presented to the hospital with complaints of a bleeding from her lower abdominal wound with fever for 2 to 3 days with  tenderness and shortness of breath.  In the ED, vitals were notable for hypotension with blood pressure low at 80/50.  Labs were notable  white count of 0.8, hemoglobin 8.2 g/dL and platelets 93.  PT was 15.9 INR 1.2.  CMP showed normal electrolytes after calcium  correction.  CTA GI bleed with no evidence of contrast extravasation into the gastric, duodenum, small bowel or colon lumen.  No acute findings in the abdomen or pelvis.  Left colonic diverticulosis without diverticulitis.  Patient received normal saline 2000 mL bolus, vancomycin  and cefepime  and was considered for admission to hospital for further evaluation and treatment.   Assessment/Plan: Principal Problem:   Sepsis due to cellulitis [facility]) Active Problems:   Pancytopenia (HCC)   Hypomagnesemia   AIDS (acquired immune deficiency syndrome) (HCC)   Anxiety and depression   Bleeding from wound   Sepsis due to abdominal wall wound cellulitis /  Bleeding from wound ID and general surgery on board.  Continue vancomycin  and cefepime .   Currently n.p.o. for possible debridement today.  Blood cultures are negative in less than 24 hours.  Follow ID and surgical recommendation.    Pancytopenia, neutropenia. In the setting of HIV disease.  ID has been consulted.  Currently on antibiotic.   Seen by Dr. Fleeta Rothman in the past.  Has been started on Bactrim  for pneumocystis prophylaxis.  Hemoglobin of 8.9 today from 6.7 yesterday after  PRBC transfusion.    HIV/AIDS, history of Kaposi's sarcoma, HIV neuropathy (acquired immune deficiency syndrome)  Not on antiretrovirals.  Likely compliance issue due to homelessness.  ID on board.    Hypomagnesemia Improved after repletion.  Magnesium  of 1.7 today.    Anxiety and depression Not on medications at this time.   DVT prophylaxis: SCDs Start: 02/12/24 1111   Disposition: Home likely in 2 to 3 days  Status is: Inpatient Remains inpatient appropriate because: Pending clinical improvement, sepsis, IV antibiotic, ID follow-up, need for surgical intervention.    Code Status:     Code Status: Full Code  Family Communication: Spoke with patient's sister on 02/13/2024  Consultants: General Surgery ID  Procedures: PRBC transfusion 1 unit  Anti-infectives:  Vancomycin , Bactrim , cefepime   Anti-infectives (From admission, onward)    Start     Dose/Rate Route Frequency Ordered Stop   02/13/24 1000  vancomycin  (VANCOREADY) IVPB 1250 mg/250 mL        1,250 mg 166.7 mL/hr over 90 Minutes Intravenous Every 24 hours 02/12/24 1119     02/12/24 1530  sulfamethoxazole -trimethoprim  (BACTRIM ) 400-80 MG per tablet 1 tablet        1 tablet Oral Daily 02/12/24 1441     02/12/24 1400  ceFEPIme  (MAXIPIME ) 2 g in sodium chloride  0.9 % 100 mL IVPB        2 g 200 mL/hr over 30 Minutes Intravenous Every 8 hours 02/12/24 1119     02/12/24 1000  vancomycin  (VANCOCIN ) IVPB 1000 mg/200 mL premix        1,000  mg 200 mL/hr over 60 Minutes Intravenous  Once 02/12/24 0951 02/12/24 1111   02/12/24 0645  cefTRIAXone  (ROCEPHIN ) 2 g in sodium chloride  0.9 % 100 mL IVPB        2 g 200 mL/hr over 30 Minutes Intravenous Once 02/12/24 0639 02/12/24 0737       Subjective: Today, patient was seen and examined at bedside.  Patient complains of mild abdominal discomfort.  And feverish feeling with some nausea.  Has had bowel movement.    Objective: Vitals:   02/13/24 1942 02/14/24 0515  BP:  107/72 113/78  Pulse: 89 91  Resp: 18 20  Temp: 98 F (36.7 C) 99.5 F (37.5 C)  SpO2: 100% 99%    Intake/Output Summary (Last 24 hours) at 02/14/2024 0856 Last data filed at 02/13/2024 1634 Gross per 24 hour  Intake 784.6 ml  Output --  Net 784.6 ml   Filed Weights   02/12/24 0628  Weight: 59 kg   Body mass index is 24.56 kg/m.   Physical Exam:  GENERAL: Patient is alert awake and oriented. Not in obvious distress. HENT: No scleral pallor or icterus. Pupils equally reactive to light. Oral mucosa is moist, right upper lid area with noted liver lesion. NECK: is supple, no gross swelling noted. CHEST: Clear to auscultation. No crackles or wheezes. CVS: S1 and S2 heard, no murmur. Regular rate and rhythm.  ABDOMEN: Soft, non-tender, bowel sounds are present.  Lower abdominal dressing present with tenderness on local palpation. EXTREMITIES: No edema. CNS: Cranial nerves are intact. No focal motor deficits. SKIN: warm and dry, abdominal wound with dressing.  Pic taken on presentation on 8/23   Data Review: I have personally reviewed the following laboratory data and studies,  CBC: Recent Labs  Lab 02/12/24 0650 02/12/24 1353 02/12/24 1857 02/13/24 1038 02/14/24 0444  WBC 0.8*  --   --   --  0.9*  NEUTROABS 0.3*  --   --   --   --   HGB 8.2* 7.4* 7.0* 6.7* 8.9*  HCT 26.5* 26.1* 23.8* 22.8* 29.7*  MCV 79.8*  --   --   --  83.4  PLT 93*  --   --   --  71*   Basic Metabolic Panel: Recent Labs  Lab 02/12/24 0650 02/14/24 0444  NA 135 133*  K 3.5 3.5  CL 103 111  CO2 21* 18*  GLUCOSE 98 84  BUN 12 14  CREATININE 0.80 0.81  CALCIUM  7.8* 7.2*  MG 1.5* 1.7  PHOS 3.0 2.9   Liver Function Tests: Recent Labs  Lab 02/12/24 0650 02/14/24 0444  AST 40 17  ALT 11 8  ALKPHOS 116 72  BILITOT 0.7 0.6  PROT 8.0 6.0*  ALBUMIN 2.9* 1.7*   No results for input(s): LIPASE, AMYLASE in the last 168 hours. No results for input(s): AMMONIA in the last 168  hours. Cardiac Enzymes: No results for input(s): CKTOTAL, CKMB, CKMBINDEX, TROPONINI in the last 168 hours. BNP (last 3 results) No results for input(s): BNP in the last 8760 hours.  ProBNP (last 3 results) No results for input(s): PROBNP in the last 8760 hours.  CBG: No results for input(s): GLUCAP in the last 168 hours. Recent Results (from the past 240 hours)  Blood Culture (routine x 2)     Status: None (Preliminary result)   Collection Time: 02/12/24  6:40 AM   Specimen: BLOOD  Result Value Ref Range Status   Specimen Description   Final  BLOOD LEFT ANTECUBITAL Performed at [facility], [address] [place], [place] [address]    Special Requests   Final    BOTTLES DRAWN AEROBIC AND ANAEROBIC Blood Culture adequate volume Performed at [facility], [address] [place], [place] [address]    Culture   Final    NO GROWTH < 24 HOURS Performed at [facility], [address] [place], [place] [address]    Report Status PENDING  Incomplete  Resp panel by RT-PCR (RSV, Flu A&B, Covid) Anterior Nasal Swab     Status: None   Collection Time: 02/12/24  6:50 AM   Specimen: Anterior Nasal Swab  Result Value Ref Range Status   SARS Coronavirus 2 by RT PCR NEGATIVE NEGATIVE Final    Comment: (NOTE) SARS-CoV-2 target nucleic acids are NOT DETECTED.  The SARS-CoV-2 RNA is generally detectable in upper respiratory specimens during the acute phase of infection. The lowest concentration of SARS-CoV-2 viral copies this assay can detect is 138 copies/mL. A negative result does not preclude SARS-Cov-2 infection and should not be used as the sole basis for treatment or other patient management decisions. A negative result may occur with  improper specimen collection/handling, submission of specimen other than nasopharyngeal swab, presence of viral mutation(s) within the areas targeted by this assay, and inadequate number of  viral copies(<138 copies/mL). A negative result must be combined with clinical observations, patient history, and epidemiological information. The expected result is Negative.  Fact Sheet for Patients:  BloggerCourse.com  Fact Sheet for Healthcare Providers:  SeriousBroker.it  This test is no t yet approved or cleared by the [place]  FDA and  has been authorized for detection and/or diagnosis of SARS-CoV-2 by FDA under an Emergency Use Authorization (EUA). This EUA will remain  in effect (meaning this test can be used) for the duration of the COVID-19 declaration under Section 564(b)(1) of the Act, 21 U.S.C.section 360bbb-3(b)(1), unless the authorization is terminated  or revoked sooner.       Influenza A by PCR NEGATIVE NEGATIVE Final   Influenza B by PCR NEGATIVE NEGATIVE Final    Comment: (NOTE) The Xpert Xpress SARS-CoV-2/FLU/RSV plus assay is intended as an aid in the diagnosis of influenza from Nasopharyngeal swab specimens and should not be used as a sole basis for treatment. Nasal washings and aspirates are unacceptable for Xpert Xpress SARS-CoV-2/FLU/RSV testing.  Fact Sheet for Patients: BloggerCourse.com  Fact Sheet for Healthcare Providers: SeriousBroker.it  This test is not yet approved or cleared by the [place]  FDA and has been authorized for detection and/or diagnosis of SARS-CoV-2 by FDA under an Emergency Use Authorization (EUA). This EUA will remain in effect (meaning this test can be used) for the duration of the COVID-19 declaration under Section 564(b)(1) of the Act, 21 U.S.C. section 360bbb-3(b)(1), unless the authorization is terminated or revoked.     Resp Syncytial Virus by PCR NEGATIVE NEGATIVE Final    Comment: (NOTE) Fact Sheet for Patients: BloggerCourse.com  Fact Sheet for Healthcare  Providers: SeriousBroker.it  This test is not yet approved or cleared by the [place]  FDA and has been authorized for detection and/or diagnosis of SARS-CoV-2 by FDA under an Emergency Use Authorization (EUA). This EUA will remain in effect (meaning this test can be used) for the duration of the COVID-19 declaration under Section 564(b)(1) of the Act, 21 U.S.C. section 360bbb-3(b)(1), unless the authorization is terminated or revoked.  Performed at [facility],  [address] [place], [place] [address]   Blood Culture (routine x 2)     Status: None (Preliminary result)   Collection Time: 02/12/24  6:52 AM   Specimen: BLOOD  Result Value Ref Range Status   Specimen Description   Final    BLOOD RIGHT ANTECUBITAL Performed at [facility], [address] [place], [place] [address]    Special Requests   Final    BOTTLES DRAWN AEROBIC AND ANAEROBIC Blood Culture adequate volume Performed at [facility], [address] [place], [place] [address]    Culture   Final    NO GROWTH < 24 HOURS Performed at [facility], [address], [place], [place] [address]    Report Status PENDING  Incomplete     Studies: No results found.     Hamid Brookens, MD  [facility] 02/14/2024  If 7PM-7AM, please contact night-coverage

## 2024-02-14 NOTE — Plan of Care (Signed)
  Problem: Clinical Measurements: Goal: Will remain free from infection Outcome: Progressing   Problem: Activity: Goal: Risk for activity intolerance will decrease Outcome: Progressing   Problem: Pain Managment: Goal: General experience of comfort will improve and/or be controlled Outcome: Progressing

## 2024-02-14 NOTE — Transfer of Care (Signed)
 Immediate Anesthesia Transfer of Care Note  Patient: Beverly [name]  Procedure(s) Performed: Incisional Biopsy of Abdominal Wall Wound (Abdomen)  Patient Location: Endoscopy Unit  Anesthesia Type:General  Level of Consciousness: awake and patient cooperative  Airway & Oxygen Therapy: Patient Spontanous Breathing and Patient connected to face mask  Post-op Assessment: Report given to RN and Post -op Vital signs reviewed and stable  Post vital signs: Reviewed and stable  Last Vitals:  Vitals Value Taken Time  BP 129/84 02/14/24 15:19  Temp    Pulse 95 02/14/24 15:21  Resp 23 02/14/24 15:21  SpO2 100 % 02/14/24 15:21  Vitals shown include unfiled device data.  Last Pain:  Vitals:   02/14/24 1317  TempSrc:   PainSc: 8       Patients Stated Pain Goal: 4 (02/14/24 1317)  Complications: No notable events documented.

## 2024-02-14 NOTE — Op Note (Signed)
 02/14/2024  3:07 PM  PATIENT:  Beverly [name]  43 y.o. female  PRE-OPERATIVE DIAGNOSIS:  CHRONIC ABDOMINAL WOUND  POST-OPERATIVE DIAGNOSIS: Chronic abdominal wound  PROCEDURE:  Procedure(s): Incisional Biopsy of Abdominal Wall Wound (N/A)  SURGEON:  Surgeons and Role:    DEWAINE Curvin Deward DOUGLAS, MD - Primary  PHYSICIAN ASSISTANT:   ASSISTANTS: none   ANESTHESIA:   general  EBL:  0 mL   BLOOD ADMINISTERED:none  DRAINS: none   LOCAL MEDICATIONS USED:  NONE  SPECIMEN:  Source of Specimen:  wall of chronic abd wall wound  DISPOSITION OF SPECIMEN:  PATHOLOGY  COUNTS:  YES  TOURNIQUET:  * No tourniquets in log *  DICTATION: .Dragon Dictation  After informed consent was obtained the patient was brought to the operating room and placed in the supine position on the operating room table.  After adequate induction of general anesthesia the patient's abdomen was prepped with Betadine and draped in usual sterile manner.  An appropriate timeout was performed.  The patient's lower abdominal wound was actually very clean.  There were no areas of necrotic tissue.  There was some frond-like papillary tissue along the left lower flap of the wound.  This tissue was excised sharply with the electrocautery.  The tissue removed measured approximately 3 x 1 cm.  This was sent to pathology for further evaluation.  Hemostasis was achieved using the Bovie electrocautery.  The wound was then packed with two 4 x 4's moistened with saline.  Sterile dressings were applied.  The patient tolerated the procedure well.  At the end of the case all needle sponge and instrument counts were correct.  The patient was then awakened and taken to recovery in stable condition.  PLAN OF CARE: Admit to inpatient   PATIENT DISPOSITION:  PACU - hemodynamically stable.   Delay start of Pharmacological VTE agent (>24hrs) due to surgical blood loss or risk of bleeding: no

## 2024-02-14 NOTE — Anesthesia Procedure Notes (Signed)
 Procedure Name: Intubation Date/Time: 02/14/2024 2:49 PM  Performed by: Judythe Tanda Aran, CRNAPre-anesthesia Checklist: Patient identified, Emergency Drugs available, Suction available and Patient being monitored Patient Re-evaluated:Patient Re-evaluated prior to induction Oxygen Delivery Method: Circle system utilized Preoxygenation: Pre-oxygenation with 100% oxygen Induction Type: IV induction Ventilation: Mask ventilation without difficulty Laryngoscope Size: 2 and Miller Grade View: Grade I Tube type: Oral Tube size: 7.0 mm Number of attempts: 1 Airway Equipment and Method: Stylet Placement Confirmation: ETT inserted through vocal cords under direct vision, positive ETCO2 and breath sounds checked- equal and bilateral Secured at: 21 cm Tube secured with: Tape Dental Injury: Teeth and Oropharynx as per pre-operative assessment

## 2024-02-14 NOTE — Progress Notes (Signed)
 [facility]  Date of Admission:  02/12/2024   Total days of inpatient antibiotics 3  Principal Problem:   Sepsis due to cellulitis (HCC) Active Problems:   AIDS (acquired immune deficiency syndrome) (HCC)   Anxiety and depression   Pancytopenia (HCC)   Hypomagnesemia   Bleeding from wound          Assessment: 42 year old female admitted with: #Cellulitis of chronic lower abdominal wound associated bleeding #Pancytopenia #Febrile neutropenia - CT angio pelvis showed lower anterior abdominal wound again identified.  Tiny hypoattenuating focus along the right margin of the wound.  Otherwise no other acute findings. - Blood cultures remain negative.  Patient has been afebrile on vancomycin  and cefepime  since 8/23. - Taken to the OR on 8/25 for incisional biopsy of abdominal wall wound.  It was noted that there were no areas of necrotic tissue.  There was some frond-like papillary tissue along the lower flap of the wound sent for pathology.  #HIV/AIDS - CD4 less than 35 during hospitalization, viral load pending - Patient has been off of ART for couple months.  Sounds like she was rationing her medications.  She has some medication acquisition issues to stoop experiencing homelessness.  Recommendations: -Continue vancomycin  and cefepime -> will speak to patient tomorrow and transition to doxy + cipro  to complete 10 days total of abx -Continue biktarvy  and bactrim . Please engage case management as pt states she was off of art for a couple months due to experiencing homelessness. -Communicated plan with primary -Follow Pathology, no cx sent -Follow HIV RNA, rpr nr, gcpending  Microbiology:   Antibiotics: Biktarvy  8/25- Bactrim  ppx Cefepime  8/23- Vancomycin  8/23- Cultures: Blood 8/22 Urine  Other   SUBJECTIVE: Resting in bed. No new complaints.  Interval: Afebrile overnight. Wbc 0.4k  Review of Systems: Review of Systems  All other  systems reviewed and are negative.    Scheduled Meds:  [MAR Hold] bictegravir-emtricitabine -tenofovir  AF  1 tablet Oral Daily   [MAR Hold] sulfamethoxazole -trimethoprim   1 tablet Oral Daily   Continuous Infusions:  [MAR Hold] ceFEPime  (MAXIPIME ) IV 2 g (02/14/24 0504)   lactated ringers  100 mL/hr at 02/14/24 1322   promethazine (PHENERGAN) injection (IM or IVPB)     [MAR Hold] vancomycin  1,250 mg (02/14/24 0924)   PRN Meds:.[MAR Hold] acetaminophen  **OR** [MAR Hold] acetaminophen , amisulpride , [MAR Hold] diphenhydrAMINE , HYDROmorphone  (DILAUDID ) injection, [MAR Hold]  HYDROmorphone  (DILAUDID ) injection, [MAR Hold] hydrOXYzine , [MAR Hold] ondansetron  **OR** [MAR Hold] ondansetron  (ZOFRAN ) IV, [MAR Hold] oxyCODONE , oxyCODONE  **OR** oxyCODONE , promethazine (PHENERGAN) injection (IM or IVPB) Allergies  Allergen Reactions   Bee Venom Anaphylaxis, Shortness Of Breath and Swelling   Ferrlecit  [Na Ferric Gluc Cplx In Sucrose] Swelling    Swelling and puffiness in hands and legs after Ferrlecit  250 mg infused over 60 minutes (possibly infusion related reaction)   Morphine  And Codeine Hives and Itching    Just can't take morphine , vicodin is OK    OBJECTIVE: Vitals:   02/14/24 0515 02/14/24 1208 02/14/24 1303 02/14/24 1519  BP: 113/78 111/86 117/79 129/84  Pulse: 91 84 85 89  Resp: 20 16 16 16   Temp: 99.5 F (37.5 C) 98.4 F (36.9 C) 99.4 F (37.4 C) 98 F (36.7 C)  TempSrc:   Oral   SpO2: 99% 93% 98% 100%  Weight:      Height:       Body mass index is 24.56 kg/m.  Physical Exam Constitutional:      Appearance:  Normal appearance.  HENT:     Head: Normocephalic and atraumatic.     Right Ear: Tympanic membrane normal.     Left Ear: Tympanic membrane normal.     Nose: Nose normal.     Mouth/Throat:     Mouth: Mucous membranes are moist.  Eyes:     Extraocular Movements: Extraocular movements intact.     Conjunctiva/sclera: Conjunctivae normal.     Pupils: Pupils are  equal, round, and reactive to light.  Cardiovascular:     Rate and Rhythm: Normal rate and regular rhythm.     Heart sounds: No murmur heard.    No friction rub. No gallop.  Pulmonary:     Effort: Pulmonary effort is normal.     Breath sounds: Normal breath sounds.  Abdominal:     General: Abdomen is flat.     Palpations: Abdomen is soft.  Musculoskeletal:        General: Normal range of motion.  Skin:    General: Skin is warm and dry.  Neurological:     General: No focal deficit present.     Mental Status: She is alert and oriented to person, place, and time.  Psychiatric:        Mood and Affect: Mood normal.       Lab Results Lab Results  Component Value Date   WBC 0.9 (LL) 02/14/2024   HGB 8.9 (L) 02/14/2024   HCT 29.7 (L) 02/14/2024   MCV 83.4 02/14/2024   PLT 71 (L) 02/14/2024    Lab Results  Component Value Date   CREATININE 0.81 02/14/2024   BUN 14 02/14/2024   NA 133 (L) 02/14/2024   K 3.5 02/14/2024   CL 111 02/14/2024   CO2 18 (L) 02/14/2024    Lab Results  Component Value Date   ALT 8 02/14/2024   AST 17 02/14/2024   ALKPHOS 72 02/14/2024   BILITOT 0.6 02/14/2024        Loney Stank, MD [facility] 02/14/2024, 3:27 PM Evaluation of this patient requires complex antimicrobial therapy evaluation and counseling + isolation needs for disease transmission risk assessment and mitigation

## 2024-02-14 NOTE — Progress Notes (Signed)
   02/14/24 1035  Spiritual Encounters  Type of Visit Initial  Care provided to: Patient  Referral source Patient request  Reason for visit Advance directives  OnCall Visit No  Spiritual Framework  Presenting Themes Impactful experiences and emotions   I responded to a spiritual care consult sharing desire of Ms. Beverly [name] to receive HCPOA information.  Beverly [name] received my visit. She debriefed with me around her current health and needs. Notably, her father just passed 2wks ago following surgery. Beverly [name] named positive outlook, staying focused on the present, and her faith/prayers of support as sources of strength in addition to visits from her daughter and other family (she has a sister and brother; four children and grandchildren.)  I provided compassionate presence and active listening. I invited reflection on sources of support. We discussed grief process and processing/normalization of emotions for loss of father, coping with illness, and change/decisions regarding work and housing. I affirmed her faith.  I returned later with a dogbone pillow and will follow up with the [facility] document.  Tynslee Bowlds L. Fredrica, M.Div [phone]

## 2024-02-14 NOTE — Progress Notes (Signed)
 Progress Note  * Day of Surgery *  Subjective: Pt reports pain from wound but no active bleeding.   Objective: Vital signs in last 24 hours: Temp:  [98 F (36.7 C)-99.5 F (37.5 C)] 99.5 F (37.5 C) (08/25 0515) Pulse Rate:  [89-102] 91 (08/25 0515) Resp:  [16-20] 20 (08/25 0515) BP: (107-113)/(69-80) 113/78 (08/25 0515) SpO2:  [99 %-100 %] 99 % (08/25 0515) Last BM Date : 02/12/24  Intake/Output from previous day: 08/24 0701 - 08/25 0700 In: 784.6 [I.V.:19.7; Blood:315; IV Piggyback:449.9] Out: -  Intake/Output this shift: No intake/output data recorded.  PE: General: pleasant, WD, thin female who is laying in bed in NAD Heart: regular, rate, and rhythm.   Lungs: Respiratory effort nonlabored Abd: soft, Pfannenstiel wound without active bleeding but very tender to palpation, remains open Psych: A&Ox3 with an appropriate affect.    Lab Results:  Recent Labs    02/12/24 0650 02/12/24 1353 02/13/24 1038 02/14/24 0444  WBC 0.8*  --   --  0.9*  HGB 8.2*   < > 6.7* 8.9*  HCT 26.5*   < > 22.8* 29.7*  PLT 93*  --   --  71*   < > = values in this interval not displayed.   BMET Recent Labs    02/12/24 0650 02/14/24 0444  NA 135 133*  K 3.5 3.5  CL 103 111  CO2 21* 18*  GLUCOSE 98 84  BUN 12 14  CREATININE 0.80 0.81  CALCIUM  7.8* 7.2*   PT/INR Recent Labs    02/12/24 0650  LABPROT 14.5  INR 1.1   CMP     Component Value Date/Time   NA 133 (L) 02/14/2024 0444   K 3.5 02/14/2024 0444   CL 111 02/14/2024 0444   CO2 18 (L) 02/14/2024 0444   GLUCOSE 84 02/14/2024 0444   BUN 14 02/14/2024 0444   CREATININE 0.81 02/14/2024 0444   CREATININE 0.65 01/22/2022 1015   CALCIUM  7.2 (L) 02/14/2024 0444   PROT 6.0 (L) 02/14/2024 0444   ALBUMIN 1.7 (L) 02/14/2024 0444   AST 17 02/14/2024 0444   ALT 8 02/14/2024 0444   ALKPHOS 72 02/14/2024 0444   BILITOT 0.6 02/14/2024 0444   GFRNONAA >60 02/14/2024 0444   GFRNONAA 114 12/26/2019 1215   GFRAA 132  12/26/2019 1215   Lipase     Component Value Date/Time   LIPASE 18 11/22/2023 1516       Studies/Results: No results found.  Anti-infectives: Anti-infectives (From admission, onward)    Start     Dose/Rate Route Frequency Ordered Stop   02/13/24 1000  vancomycin  (VANCOREADY) IVPB 1250 mg/250 mL        1,250 mg 166.7 mL/hr over 90 Minutes Intravenous Every 24 hours 02/12/24 1119     02/12/24 1530  sulfamethoxazole -trimethoprim  (BACTRIM ) 400-80 MG per tablet 1 tablet        1 tablet Oral Daily 02/12/24 1441     02/12/24 1400  ceFEPIme  (MAXIPIME ) 2 g in sodium chloride  0.9 % 100 mL IVPB        2 g 200 mL/hr over 30 Minutes Intravenous Every 8 hours 02/12/24 1119     02/12/24 1000  vancomycin  (VANCOCIN ) IVPB 1000 mg/200 mL premix        1,000 mg 200 mL/hr over 60 Minutes Intravenous  Once 02/12/24 0951 02/12/24 1111   02/12/24 0645  cefTRIAXone  (ROCEPHIN ) 2 g in sodium chloride  0.9 % 100 mL IVPB  2 g 200 mL/hr over 30 Minutes Intravenous Once 02/12/24 9360 02/12/24 0737        Assessment/Plan Hx of open Pfannenstiel wound of unknown etiology  Bleeding from wound and poor healing - recommend gentle debridement and biopsy in OR today  - will update wound care recs post-op  - discussed with patient plans for OR today barring any surgical emergencies and she is agreement   FEN: NPO, IVF per TRH VTE: SCDs ID:Abx per ID  - per TRH -  AIDS Pancytopenia  Anxiety/depression   LOS: 2 days   I reviewed Consultant ID notes, hospitalist notes, last 24 h vitals and pain scores, last 48 h intake and output, last 24 h labs and trends, and last 24 h imaging results.  This care required high  level of medical decision making.    Burnard JONELLE Louder, [facility] 02/14/2024, 9:39 AM Please see Amion for pager number during day hours 7:00am-4:30pm

## 2024-02-15 ENCOUNTER — Other Ambulatory Visit (HOSPITAL_COMMUNITY): Payer: Self-pay

## 2024-02-15 ENCOUNTER — Encounter (HOSPITAL_COMMUNITY): Payer: Self-pay | Admitting: General Surgery

## 2024-02-15 DIAGNOSIS — B20 Human immunodeficiency virus [HIV] disease: Secondary | ICD-10-CM | POA: Diagnosis not present

## 2024-02-15 DIAGNOSIS — L0390 Cellulitis, unspecified: Secondary | ICD-10-CM | POA: Diagnosis not present

## 2024-02-15 DIAGNOSIS — D709 Neutropenia, unspecified: Secondary | ICD-10-CM | POA: Diagnosis not present

## 2024-02-15 DIAGNOSIS — L03311 Cellulitis of abdominal wall: Secondary | ICD-10-CM

## 2024-02-15 DIAGNOSIS — A419 Sepsis, unspecified organism: Secondary | ICD-10-CM | POA: Diagnosis not present

## 2024-02-15 LAB — HIV-1 RNA QUANT-NO REFLEX-BLD
HIV 1 RNA Quant: 213000 {copies}/mL
LOG10 HIV-1 RNA: 5.328 {Log_copies}/mL

## 2024-02-15 LAB — CBC
HCT: 29.7 % — ABNORMAL LOW (ref 36.0–46.0)
Hemoglobin: 8.9 g/dL — ABNORMAL LOW (ref 12.0–15.0)
MCH: 24.8 pg — ABNORMAL LOW (ref 26.0–34.0)
MCHC: 30 g/dL (ref 30.0–36.0)
MCV: 82.7 fL (ref 80.0–100.0)
Platelets: 71 K/uL — ABNORMAL LOW (ref 150–400)
RBC: 3.59 MIL/uL — ABNORMAL LOW (ref 3.87–5.11)
RDW: 16.6 % — ABNORMAL HIGH (ref 11.5–15.5)
WBC: 0.4 K/uL — CL (ref 4.0–10.5)
nRBC: 5.4 % — ABNORMAL HIGH (ref 0.0–0.2)

## 2024-02-15 LAB — BASIC METABOLIC PANEL WITH GFR
Anion gap: 6 (ref 5–15)
BUN: 20 mg/dL (ref 6–20)
CO2: 20 mmol/L — ABNORMAL LOW (ref 22–32)
Calcium: 7.7 mg/dL — ABNORMAL LOW (ref 8.9–10.3)
Chloride: 108 mmol/L (ref 98–111)
Creatinine, Ser: 0.76 mg/dL (ref 0.44–1.00)
GFR, Estimated: >60 mL/min (ref >60)
Glucose, Bld: 141 mg/dL — ABNORMAL HIGH (ref 70–99)
Potassium: 4.1 mmol/L (ref 3.5–5.1)
Sodium: 134 mmol/L — ABNORMAL LOW (ref 135–145)

## 2024-02-15 LAB — MAGNESIUM: Magnesium: 1.6 mg/dL — ABNORMAL LOW (ref 1.7–2.4)

## 2024-02-15 MED ORDER — MAGNESIUM SULFATE 2 GM/50ML IV SOLN
2.0000 g | Freq: Once | INTRAVENOUS | Status: AC
  Administered 2024-02-15: 2 g via INTRAVENOUS
  Filled 2024-02-15: qty 50

## 2024-02-15 MED ORDER — SILVER NITRATE-POT NITRATE 75-25 % EX MISC
1.0000 | Freq: Once | CUTANEOUS | Status: DC
  Filled 2024-02-15: qty 1

## 2024-02-15 MED ORDER — MAGNESIUM OXIDE -MG SUPPLEMENT 400 (240 MG) MG PO TABS
400.0000 mg | ORAL_TABLET | Freq: Two times a day (BID) | ORAL | Status: DC
  Administered 2024-02-15 – 2024-02-17 (×5): 400 mg via ORAL
  Filled 2024-02-15 (×5): qty 1

## 2024-02-15 NOTE — Anesthesia Postprocedure Evaluation (Signed)
 Anesthesia Post Note  Patient: Beverly [name]  Procedure(s) Performed: Incisional Biopsy of Abdominal Wall Wound (Abdomen)     Patient location during evaluation: PACU Anesthesia Type: General Level of consciousness: awake and alert Pain management: pain level controlled Vital Signs Assessment: post-procedure vital signs reviewed and stable Respiratory status: spontaneous breathing, nonlabored ventilation, respiratory function stable and patient connected to nasal cannula oxygen Cardiovascular status: blood pressure returned to baseline and stable Postop Assessment: no apparent nausea or vomiting Anesthetic complications: no   No notable events documented.  Last Vitals:  Vitals:   02/15/24 0440 02/15/24 1200  BP: 111/70 121/84  Pulse: 64 65  Resp: 19 16  Temp: 37 C 36.4 C  SpO2: 100% 100%    Last Pain:  Vitals:   02/15/24 1547  TempSrc:   PainSc: 7                  Epifanio Lamar BRAVO

## 2024-02-15 NOTE — Progress Notes (Signed)
 PROGRESS NOTE  Beverly [name] FMW:[id number] DOB: [birth year] DOA: 02/12/2024 PCP: Patient, No Pcp Per   LOS: 3 days   Brief narrative:   Beverly [name] is a 43 y.o. female with past medical history significant of HIV infection/AIDS not on antiretroviral therapy, anxiety, depression, asthma, active tobacco abuse, homelessness, Kaposi's sarcoma,  HIV neuropathy, chronic lower abdominal wall wound presented to the hospital with complaints of a bleeding from her lower abdominal wound with fever for 2 to 3 days with  tenderness and shortness of breath.  In the ED, vitals were notable for hypotension with blood pressure low at 80/50.  Labs were notable  white count of 0.8, hemoglobin 8.2 g/dL and platelets 93.  PT was 15.9 INR 1.2.  CMP showed normal electrolytes after calcium  correction.  CTA GI bleed with no evidence of contrast extravasation into the gastric, duodenum, small bowel or colon lumen.  No acute findings in the abdomen or pelvis.  Left colonic diverticulosis without diverticulitis.  Patient received normal saline 2000 mL bolus, vancomycin  and cefepime  and was considered for admission to hospital for further evaluation and treatment.   Assessment/Plan: Principal Problem:   Sepsis due to cellulitis [facility]) Active Problems:   Pancytopenia (HCC)   Hypomagnesemia   AIDS (acquired immune deficiency syndrome) (HCC)   Anxiety and depression   Bleeding from wound   abdominal wall wound cellulitis /  Bleeding from wound ID and general surgery on board.  Patient receiving vancomycin  and cefepime .  Surgery saw the patient and patient underwent incisional biopsy of the abdominal wound on 02/14/2024.  Surgical follow-up today without any evidence of infection clinically.  Surgical biopsy report pending.  Added AFB stain culture and fungal culture as well.  Blood cultures negative in 3 days.  Communicated with ID Dr. Dennise and recommended additional day of IV antibiotics followed by oral on  discharge.    Pancytopenia, neutropenia. In the setting of HIV disease.  ID on board.  Currently on broad-spectrum antibiotic.  Seen by Dr. Fleeta Rothman in the past.  Has been started on Bactrim  for pneumocystis prophylaxis.  Recent hemoglobin of 8.9 from initial from 6.7 after PRBC transfusion.  Latest WBC at 0.4.   HIV/AIDS, history of Kaposi's sarcoma, HIV neuropathy (acquired immune deficiency syndrome)  Not on antiretrovirals.  Likely compliance issue due to homelessness.  ID on board.    Hypomagnesemia Magnesium  of 1.6 today.  Continue IV magnesium  sulfate today.  Add magnesium  oxide.    Anxiety and depression Not on medications at this time.  Homelessness.  TOC on board for housing support.  Will likely need medication, transportation and housing support.   DVT prophylaxis: SCDs Start: 02/12/24 1111   Disposition: Home likely in 1 to 2 days  Status is: Inpatient Remains inpatient appropriate because: Pending clinical improvement, , IV antibiotic, ID follow-up, biopsy, homelessness status    Code Status:     Code Status: Full Code  Family Communication: Spoke with patient's sister on 02/13/2024  Consultants: General Surgery ID  Procedures: PRBC transfusion 1 unit Incisional biopsy of abdominal wall wound on 02/14/2024  Anti-infectives:  Vancomycin , Bactrim , cefepime   Anti-infectives (From admission, onward)    Start     Dose/Rate Route Frequency Ordered Stop   02/14/24 1100  bictegravir-emtricitabine -tenofovir  AF (BIKTARVY ) 50-200-25 MG per tablet 1 tablet        1 tablet Oral Daily 02/14/24 1000     02/13/24 1000  vancomycin  (VANCOREADY) IVPB 1250 mg/250 mL  1,250 mg 166.7 mL/hr over 90 Minutes Intravenous Every 24 hours 02/12/24 1119     02/12/24 1530  sulfamethoxazole -trimethoprim  (BACTRIM ) 400-80 MG per tablet 1 tablet        1 tablet Oral Daily 02/12/24 1441     02/12/24 1400  ceFEPIme  (MAXIPIME ) 2 g in sodium chloride  0.9 % 100 mL IVPB        2 g 200  mL/hr over 30 Minutes Intravenous Every 8 hours 02/12/24 1119     02/12/24 1000  vancomycin  (VANCOCIN ) IVPB 1000 mg/200 mL premix        1,000 mg 200 mL/hr over 60 Minutes Intravenous  Once 02/12/24 0951 02/12/24 1111   02/12/24 0645  cefTRIAXone  (ROCEPHIN ) 2 g in sodium chloride  0.9 % 100 mL IVPB        2 g 200 mL/hr over 30 Minutes Intravenous Once 02/12/24 0639 02/12/24 0737       Subjective: Today, patient was seen and examined at bedside.  Complains of local abdominal pain.  Denies any nausea vomiting fever chills or rigor.  Objective: Vitals:   02/15/24 0440 02/15/24 1200  BP: 111/70 121/84  Pulse: 64 65  Resp: 19 16  Temp: 98.6 F (37 C) 97.6 F (36.4 C)  SpO2: 100% 100%    Intake/Output Summary (Last 24 hours) at 02/15/2024 1517 Last data filed at 02/15/2024 0753 Gross per 24 hour  Intake 370 ml  Output --  Net 370 ml   Filed Weights   02/12/24 0628  Weight: 59 kg   Body mass index is 24.56 kg/m.   Physical Exam:  GENERAL: Patient is alert awake and oriented. Not in obvious distress. HENT: No scleral pallor or icterus. Pupils equally reactive to light. Oral mucosa is moist, right upper lid area with noted liver lesion. NECK: is supple, no gross swelling noted. CHEST: Clear to auscultation. No crackles or wheezes. CVS: S1 and S2 heard, no murmur. Regular rate and rhythm.  ABDOMEN: Soft, non-tender, bowel sounds are present.  Lower abdominal dressing EXTREMITIES: No edema. CNS: Cranial nerves are intact. No focal motor deficits. SKIN: warm and dry, abdominal wound with dressing.  Picture on the day of admission on 8/23   Data Review: I have personally reviewed the following laboratory data and studies,  CBC: Recent Labs  Lab 02/12/24 0650 02/12/24 1353 02/12/24 1857 02/13/24 1038 02/14/24 0444 02/15/24 0426  WBC 0.8*  --   --   --  0.9* 0.4*  NEUTROABS 0.3*  --   --   --   --   --   HGB 8.2* 7.4* 7.0* 6.7* 8.9* 8.9*  HCT 26.5* 26.1* 23.8* 22.8*  29.7* 29.7*  MCV 79.8*  --   --   --  83.4 82.7  PLT 93*  --   --   --  71* 71*   Basic Metabolic Panel: Recent Labs  Lab 02/12/24 0650 02/14/24 0444 02/15/24 0426  NA 135 133* 134*  K 3.5 3.5 4.1  CL 103 111 108  CO2 21* 18* 20*  GLUCOSE 98 84 141*  BUN 12 14 20   CREATININE 0.80 0.81 0.76  CALCIUM  7.8* 7.2* 7.7*  MG 1.5* 1.7 1.6*  PHOS 3.0 2.9  --    Liver Function Tests: Recent Labs  Lab 02/12/24 0650 02/14/24 0444  AST 40 17  ALT 11 8  ALKPHOS 116 72  BILITOT 0.7 0.6  PROT 8.0 6.0*  ALBUMIN 2.9* 1.7*   No results for input(s): LIPASE, AMYLASE in the last  168 hours. No results for input(s): AMMONIA in the last 168 hours. Cardiac Enzymes: No results for input(s): CKTOTAL, CKMB, CKMBINDEX, TROPONINI in the last 168 hours. BNP (last 3 results) No results for input(s): BNP in the last 8760 hours.  ProBNP (last 3 results) No results for input(s): PROBNP in the last 8760 hours.  CBG: No results for input(s): GLUCAP in the last 168 hours. Recent Results (from the past 240 hours)  Blood Culture (routine x 2)     Status: None (Preliminary result)   Collection Time: 02/12/24  6:40 AM   Specimen: BLOOD  Result Value Ref Range Status   Specimen Description   Final    BLOOD LEFT ANTECUBITAL Performed at [facility], [address] [place], [place] [address]    Special Requests   Final    BOTTLES DRAWN AEROBIC AND ANAEROBIC Blood Culture adequate volume Performed at [facility], [address] [place], [place] [address]    Culture   Final    NO GROWTH 3 DAYS Performed at [facility], [address], [place], [place] [address]    Report Status PENDING  Incomplete  Resp panel by RT-PCR (RSV, Flu A&B, Covid) Anterior Nasal Swab     Status: None   Collection Time: 02/12/24  6:50 AM   Specimen: Anterior Nasal Swab  Result Value Ref Range Status   SARS Coronavirus 2 by RT PCR NEGATIVE NEGATIVE Final    Comment:  (NOTE) SARS-CoV-2 target nucleic acids are NOT DETECTED.  The SARS-CoV-2 RNA is generally detectable in upper respiratory specimens during the acute phase of infection. The lowest concentration of SARS-CoV-2 viral copies this assay can detect is 138 copies/mL. A negative result does not preclude SARS-Cov-2 infection and should not be used as the sole basis for treatment or other patient management decisions. A negative result may occur with  improper specimen collection/handling, submission of specimen other than nasopharyngeal swab, presence of viral mutation(s) within the areas targeted by this assay, and inadequate number of viral copies(<138 copies/mL). A negative result must be combined with clinical observations, patient history, and epidemiological information. The expected result is Negative.  Fact Sheet for Patients:  BloggerCourse.com  Fact Sheet for Healthcare Providers:  SeriousBroker.it  This test is no t yet approved or cleared by the [place]  FDA and  has been authorized for detection and/or diagnosis of SARS-CoV-2 by FDA under an Emergency Use Authorization (EUA). This EUA will remain  in effect (meaning this test can be used) for the duration of the COVID-19 declaration under Section 564(b)(1) of the Act, 21 U.S.C.section 360bbb-3(b)(1), unless the authorization is terminated  or revoked sooner.       Influenza A by PCR NEGATIVE NEGATIVE Final   Influenza B by PCR NEGATIVE NEGATIVE Final    Comment: (NOTE) The Xpert Xpress SARS-CoV-2/FLU/RSV plus assay is intended as an aid in the diagnosis of influenza from Nasopharyngeal swab specimens and should not be used as a sole basis for treatment. Nasal washings and aspirates are unacceptable for Xpert Xpress SARS-CoV-2/FLU/RSV testing.  Fact Sheet for Patients: BloggerCourse.com  Fact Sheet for Healthcare  Providers: SeriousBroker.it  This test is not yet approved or cleared by the [place]  FDA and has been authorized for detection and/or diagnosis of SARS-CoV-2 by FDA under an Emergency Use Authorization (EUA). This EUA will remain in effect (meaning this test can be used) for the duration of the COVID-19 declaration under Section 564(b)(1) of the  Act, 21 U.S.C. section 360bbb-3(b)(1), unless the authorization is terminated or revoked.     Resp Syncytial Virus by PCR NEGATIVE NEGATIVE Final    Comment: (NOTE) Fact Sheet for Patients: BloggerCourse.com  Fact Sheet for Healthcare Providers: SeriousBroker.it  This test is not yet approved or cleared by the [place]  FDA and has been authorized for detection and/or diagnosis of SARS-CoV-2 by FDA under an Emergency Use Authorization (EUA). This EUA will remain in effect (meaning this test can be used) for the duration of the COVID-19 declaration under Section 564(b)(1) of the Act, 21 U.S.C. section 360bbb-3(b)(1), unless the authorization is terminated or revoked.  Performed at [facility], [address] [place], [place] [address]   Blood Culture (routine x 2)     Status: None (Preliminary result)   Collection Time: 02/12/24  6:52 AM   Specimen: BLOOD  Result Value Ref Range Status   Specimen Description   Final    BLOOD RIGHT ANTECUBITAL Performed at [facility], [address] [place], [place] [address]    Special Requests   Final    BOTTLES DRAWN AEROBIC AND ANAEROBIC Blood Culture adequate volume Performed at [facility], [address] [place], [place] [address]    Culture   Final    NO GROWTH 3 DAYS Performed at [facility], [address] [place], [place] [address]    Report Status PENDING  Incomplete     Studies: No results found.     Vernal Alstrom, MD  [facility] 02/15/2024  If 7PM-7AM, please contact night-coverage

## 2024-02-15 NOTE — TOC Benefit Eligibility Note (Signed)
 Pharmacy Patient Advocate Encounter  Insurance verification completed.    The patient is insured through [facility].     Ran test claim for Biktarvy  30-120-15mg  and the current 30 day co-pay is $0.   This test claim was processed through [facility]- copay amounts may vary at other pharmacies due to [employer], or as the patient moves through the different stages of their insurance plan.

## 2024-02-15 NOTE — Progress Notes (Signed)
 Progress Note  1 Day Post-Op  Subjective: Pt reports pain from wound this AM, very tender with dressing change but wound appears clean. A lot of anxiety with dressing change. She reports at home wound was not as deep and she was covering with a band-aid. She does report that she would allow water to run over wound while she was showering.   Objective: Vital signs in last 24 hours: Temp:  [98 F (36.7 C)-99.4 F (37.4 C)] 98.6 F (37 C) (08/26 0440) Pulse Rate:  [64-89] 64 (08/26 0440) Resp:  [13-20] 19 (08/26 0440) BP: (96-129)/(66-86) 111/70 (08/26 0440) SpO2:  [93 %-100 %] 100 % (08/26 0440) Last BM Date : 02/13/24  Intake/Output from previous day: 08/25 0701 - 08/26 0700 In: 530 [P.O.:30; I.V.:500] Out: 0  Intake/Output this shift: Total I/O In: 240 [P.O.:240] Out: -   PE: General: pleasant, WD, thin female who is laying in bed in NAD Heart: regular, rate, and rhythm.   Lungs: Respiratory effort nonlabored Abd: soft, Pfannenstiel wound that appears clean, minimal oozing from inferior wound, unable to hold much pressure due to patient discomfort and grabbing at my hands. No significant cellulitis surrounding wound Psych: A&Ox3 with an anxious affect.    Lab Results:  Recent Labs    02/14/24 0444 02/15/24 0426  WBC 0.9* 0.4*  HGB 8.9* 8.9*  HCT 29.7* 29.7*  PLT 71* 71*   BMET Recent Labs    02/14/24 0444 02/15/24 0426  NA 133* 134*  K 3.5 4.1  CL 111 108  CO2 18* 20*  GLUCOSE 84 141*  BUN 14 20  CREATININE 0.81 0.76  CALCIUM  7.2* 7.7*   PT/INR No results for input(s): LABPROT, INR in the last 72 hours.  CMP     Component Value Date/Time   NA 134 (L) 02/15/2024 0426   K 4.1 02/15/2024 0426   CL 108 02/15/2024 0426   CO2 20 (L) 02/15/2024 0426   GLUCOSE 141 (H) 02/15/2024 0426   BUN 20 02/15/2024 0426   CREATININE 0.76 02/15/2024 0426   CREATININE 0.65 01/22/2022 1015   CALCIUM  7.7 (L) 02/15/2024 0426   PROT 6.0 (L) 02/14/2024 0444    ALBUMIN 1.7 (L) 02/14/2024 0444   AST 17 02/14/2024 0444   ALT 8 02/14/2024 0444   ALKPHOS 72 02/14/2024 0444   BILITOT 0.6 02/14/2024 0444   GFRNONAA >60 02/15/2024 0426   GFRNONAA 114 12/26/2019 1215   GFRAA 132 12/26/2019 1215   Lipase     Component Value Date/Time   LIPASE 18 11/22/2023 1516       Studies/Results: No results found.  Anti-infectives: Anti-infectives (From admission, onward)    Start     Dose/Rate Route Frequency Ordered Stop   02/14/24 1100  bictegravir-emtricitabine -tenofovir  AF (BIKTARVY ) 50-200-25 MG per tablet 1 tablet        1 tablet Oral Daily 02/14/24 1000     02/13/24 1000  vancomycin  (VANCOREADY) IVPB 1250 mg/250 mL        1,250 mg 166.7 mL/hr over 90 Minutes Intravenous Every 24 hours 02/12/24 1119     02/12/24 1530  sulfamethoxazole -trimethoprim  (BACTRIM ) 400-80 MG per tablet 1 tablet        1 tablet Oral Daily 02/12/24 1441     02/12/24 1400  ceFEPIme  (MAXIPIME ) 2 g in sodium chloride  0.9 % 100 mL IVPB        2 g 200 mL/hr over 30 Minutes Intravenous Every 8 hours 02/12/24 1119     02/12/24  1000  vancomycin  (VANCOCIN ) IVPB 1000 mg/200 mL premix        1,000 mg 200 mL/hr over 60 Minutes Intravenous  Once 02/12/24 0951 02/12/24 1111   02/12/24 0645  cefTRIAXone  (ROCEPHIN ) 2 g in sodium chloride  0.9 % 100 mL IVPB        2 g 200 mL/hr over 30 Minutes Intravenous Once 02/12/24 9360 02/12/24 0737        Assessment/Plan Hx of open Pfannenstiel wound of unknown etiology  Bleeding from wound and poor healing POD1 biopsy of abdominal wound - wound clean and without need of debridement in OR - ok to decrease dressing changes to daily - this is not the source of sepsis, she does not have a wound infection or cellulitis  - pathology pending - management per ID/TRH - general surgery will sign off at this time, please call if we can be of further assistance  FEN: reg, IVF per TRH VTE: SCDs ID:Abx per ID/TRH  - per TRH -  AIDS Pancytopenia   Anxiety/depression   LOS: 3 days     Burnard JONELLE Louder, [facility] 02/15/2024, 9:32 AM Please see Amion for pager number during day hours 7:00am-4:30pm

## 2024-02-15 NOTE — Plan of Care (Signed)

## 2024-02-16 ENCOUNTER — Other Ambulatory Visit: Payer: Self-pay

## 2024-02-16 ENCOUNTER — Other Ambulatory Visit (HOSPITAL_COMMUNITY): Payer: Self-pay

## 2024-02-16 DIAGNOSIS — D709 Neutropenia, unspecified: Secondary | ICD-10-CM | POA: Diagnosis not present

## 2024-02-16 DIAGNOSIS — L03311 Cellulitis of abdominal wall: Secondary | ICD-10-CM | POA: Diagnosis not present

## 2024-02-16 DIAGNOSIS — B20 Human immunodeficiency virus [HIV] disease: Secondary | ICD-10-CM | POA: Diagnosis not present

## 2024-02-16 LAB — ACID FAST CULTURE WITH REFLEXED SENSITIVITIES (MYCOBACTERIA)

## 2024-02-16 LAB — ACID FAST SMEAR (AFB, MYCOBACTERIA)

## 2024-02-16 MED ORDER — CIPROFLOXACIN HCL 500 MG PO TABS
500.0000 mg | ORAL_TABLET | Freq: Two times a day (BID) | ORAL | 0 refills | Status: DC
  Filled 2024-02-16: qty 20, 10d supply, fill #0

## 2024-02-16 MED ORDER — CIPROFLOXACIN HCL 500 MG PO TABS
500.0000 mg | ORAL_TABLET | Freq: Two times a day (BID) | ORAL | Status: DC
  Administered 2024-02-16 – 2024-02-17 (×3): 500 mg via ORAL
  Filled 2024-02-16 (×3): qty 1

## 2024-02-16 MED ORDER — CIPROFLOXACIN HCL 500 MG PO TABS
500.0000 mg | ORAL_TABLET | Freq: Two times a day (BID) | ORAL | 0 refills | Status: AC
  Filled 2024-02-16: qty 10, 5d supply, fill #0

## 2024-02-16 MED ORDER — DOXYCYCLINE HYCLATE 100 MG PO TABS
100.0000 mg | ORAL_TABLET | Freq: Two times a day (BID) | ORAL | Status: DC
  Administered 2024-02-16 – 2024-02-17 (×3): 100 mg via ORAL
  Filled 2024-02-16 (×3): qty 1

## 2024-02-16 MED ORDER — SULFAMETHOXAZOLE-TRIMETHOPRIM 400-80 MG PO TABS
1.0000 | ORAL_TABLET | Freq: Every day | ORAL | 0 refills | Status: DC
  Filled 2024-02-16: qty 30, 30d supply, fill #0

## 2024-02-16 MED ORDER — MAGNESIUM OXIDE -MG SUPPLEMENT 400 (240 MG) MG PO TABS
400.0000 mg | ORAL_TABLET | Freq: Two times a day (BID) | ORAL | 0 refills | Status: AC
  Filled 2024-02-16: qty 10, 5d supply, fill #0

## 2024-02-16 MED ORDER — BICTEGRAVIR-EMTRICITAB-TENOFOV 50-200-25 MG PO TABS
1.0000 | ORAL_TABLET | Freq: Every day | ORAL | 0 refills | Status: DC
  Filled 2024-02-16: qty 30, 30d supply, fill #0

## 2024-02-16 MED ORDER — DOXYCYCLINE HYCLATE 100 MG PO CAPS
100.0000 mg | ORAL_CAPSULE | Freq: Two times a day (BID) | ORAL | 0 refills | Status: DC
Start: 2024-02-16 — End: 2024-02-16
  Filled 2024-02-16: qty 20, 10d supply, fill #0

## 2024-02-16 MED ORDER — DOXYCYCLINE HYCLATE 100 MG PO CAPS
100.0000 mg | ORAL_CAPSULE | Freq: Two times a day (BID) | ORAL | 0 refills | Status: AC
  Filled 2024-02-16: qty 10, 5d supply, fill #0

## 2024-02-16 MED ORDER — OXYCODONE HCL 5 MG PO TABS
5.0000 mg | ORAL_TABLET | Freq: Four times a day (QID) | ORAL | 0 refills | Status: DC | PRN
Start: 2024-02-16 — End: 2024-05-15
  Filled 2024-02-16: qty 15, 4d supply, fill #0

## 2024-02-16 MED ORDER — ONDANSETRON HCL 4 MG PO TABS
4.0000 mg | ORAL_TABLET | Freq: Four times a day (QID) | ORAL | 0 refills | Status: DC | PRN
  Filled 2024-02-16: qty 20, 5d supply, fill #0

## 2024-02-16 NOTE — TOC Progression Note (Signed)
 Transition of Care [facility]) - Progression Note    Patient Details  Name: Beverly [name] MRN: [id number] Date of Birth: December 31, 1980  [facility] of [facility]) CM/SW Contact  Doneta Glenys DASEN, RN Phone Number: 02/16/2024, 11:45 AM  Clinical Narrative:    CM spoke with patient in the room. Patient is staying with a friend [name] (friend) [phone] [address], [place] [address]. Patient states she should be able to manage her own dressing changes with supplies. Patient states that her daughter [name] [phone] will transport at discharge.   Expected Discharge Plan: Home/Self Care Barriers to Discharge: Continued Medical Work up               Expected Discharge Plan and Services In-house Referral: Clinical Social Work Discharge Planning Services: NA Post Acute Care Choice: NA Living arrangements for the past 2 months: Homeless Expected Discharge Date: 02/16/24               DME Arranged: N/A DME Agency: NA       HH Arranged: NA HH Agency: NA         Social Drivers of Health (SDOH) Interventions SDOH Screenings   Food Insecurity: Food Insecurity Present (02/12/2024)  Housing: High Risk (02/12/2024)  Transportation Needs: Unmet Transportation Needs (02/12/2024)  Utilities: At Risk (02/12/2024)  Depression (PHQ2-9): High Risk (12/31/2022)  Tobacco Use: High Risk (02/14/2024)    Readmission Risk Interventions    02/13/2024   10:23 AM  Readmission Risk Prevention Plan  Transportation Screening Complete  PCP or Specialist Appt within 5-7 Days Not Complete  Not Complete comments N/A, pt has Medicaid  Home Care Screening Complete  Medication Review (RN CM) Complete

## 2024-02-16 NOTE — Plan of Care (Signed)

## 2024-02-16 NOTE — Discharge Summary (Signed)
 Physician Discharge Summary  [name] FMW:[id number] DOB: 08/26/1980 DOA: 02/12/2024  PCP: Patient, No Pcp Per  Admit date: 02/12/2024 Discharge date: 02/16/2024  Admitted From: Home  Discharge disposition: Home   Recommendations for Outpatient Follow-Up:   Follow up with your primary care provider in one week.  Check CBC, BMP, magnesium  in the next visit Follow-up with infectious disease clinic as outpatient patient as scheduled with the clinic   Discharge Diagnosis:   Principal Problem:   Sepsis due to cellulitis [facility]) Active Problems:   Pancytopenia (HCC)   Hypomagnesemia   AIDS (acquired immune deficiency syndrome) (HCC)   Anxiety and depression   Bleeding from wound    Discharge Condition: Improved.  Diet recommendation:   Regular.  Wound care: Local wound care  Code status: Full.   History of Present Illness:   Beverly [name] is a 43 y.o. female with past medical history significant of HIV infection/AIDS not on antiretroviral therapy, anxiety, depression, asthma, active tobacco abuse, homelessness, Kaposi's sarcoma,  HIV neuropathy, chronic lower abdominal wall wound presented to the hospital with complaints of a bleeding from her lower abdominal wound with fever for 2 to 3 days with  tenderness and shortness of breath.  In the ED, vitals were notable for hypotension with blood pressure low at 80/50.  Labs were notable  white count of 0.8, hemoglobin 8.2 g/dL and platelets 93.  PT was 15.9 INR 1.2.  CMP showed normal electrolytes after calcium  correction.  CTA GI bleed with no evidence of contrast extravasation into the gastric, duodenum, small bowel or colon lumen.  No acute findings in the abdomen or pelvis.  Left colonic diverticulosis without diverticulitis.  Patient received normal saline 2000 mL bolus, vancomycin  and cefepime  and was considered for admission to hospital for further evaluation and treatment.    Hospital Course:   Following  conditions were addressed during hospitalization as listed below,  Abdominal wall wound with cellulitis/  Bleeding from wound ID and general surgery on board.  Patient initially received vancomycin  and cefepime .  General surgery saw the patient and patient underwent incisional biopsy of the abdominal wound on 02/14/2024.  Surgical follow-up stated no infection clinically.  Blood cultures negative in 4 days, AFB smear and culture pending.  Surgical biopsy report pending.  Day on board and plan for oral antibiotics with Cipro  and Doxy on discharge to complete the course.    pancytopenia, neutropenia. In the setting of HIV disease.   Seen by Dr. Fleeta Rothman in the past.  Patient has been started on Bactrim  for pneumocystis prophylaxis.  Recent hemoglobin of 8.9 from initial from 6.7 after PRBC transfusion.  Latest WBC at 0.4.  Will continue Bactrim  on discharge.  Follow-up CBC as outpatient.   HIV/AIDS, history of Kaposi's sarcoma, HIV neuropathy (acquired immune deficiency syndrome)  Not on antiretrovirals.  Likely compliance issue due to homelessness.  ID followed the patient during hospitalization and has been started on Biktarvy .     Hypomagnesemia Magnesium  was 1.6 received IV magnesium  sulfate and oxide during hospitalization.     Anxiety and depression Not on medications at this time.   Homelessness.  TOC was consulted for housing support.   Disposition.  At this time, patient is stable for disposition home with outpatient PCP and ID follow-up  Medical Consultants:   Infectious disease  Procedures:    PRBC transfusion 1 unit Incisional biopsy of abdominal wall wound on 02/14/2024 Subjective:   Today, patient was seen and examined at  bedside.  Complains of mild abdominal pain during wound dressing.  Has some nausea.  Not vomiting fever chills or rigor.  Discharge Exam:   Vitals:   02/16/24 1211 02/16/24 1251  BP: 107/77 118/79  Pulse: 75 67  Resp: 16 17  Temp:  97.6 F (36.4 C)   SpO2: 100% 100%   Vitals:   02/16/24 0557 02/16/24 1113 02/16/24 1211 02/16/24 1251  BP: 127/82 113/77 107/77 118/79  Pulse: 86 66 75 67  Resp: 18 17 16 17   Temp: 97.9 F (36.6 C) 97.7 F (36.5 C)  97.6 F (36.4 C)  TempSrc:  Oral  Oral  SpO2: 100% 100% 100% 100%  Weight:      Height:       Body mass index is 24.56 kg/m.  General: Alert awake, not in obvious distress HENT: pupils equally reacting to light,  No scleral pallor or icterus noted. Oral mucosa is moist.  Right upper lid area with lesion. Chest:  Clear breath sounds.   No crackles or wheezes.  CVS: S1 &S2 heard. No murmur.  Regular rate and rhythm. Abdomen: Soft, abdominal wall with dressing.  Bowel sounds are heard.   Extremities: No cyanosis, clubbing or edema.  Peripheral pulses are palpable. Psych: Alert, awake and oriented, normal mood CNS:  No cranial nerve deficits.  Power equal in all extremities.   Skin: Warm and dry.  Abdominal wound with dressing.  The results of significant diagnostics from this hospitalization (including imaging, microbiology, ancillary and laboratory) are listed below for reference.     Diagnostic Studies:   CT Angio Abd/Pel W and/or Wo Contrast Result Date: 02/12/2024 CLINICAL DATA:  Bleeding wound under the pannus of the suprapubic area. EXAM: CTA ABDOMEN AND PELVIS WITHOUT AND WITH CONTRAST TECHNIQUE: Multidetector CT imaging of the abdomen and pelvis was performed using the standard protocol during bolus administration of intravenous contrast. Multiplanar reconstructed images and MIPs were obtained and reviewed to evaluate the vascular anatomy. RADIATION DOSE REDUCTION: This exam was performed according to the departmental dose-optimization program which includes automated exposure control, adjustment of the mA and/or kV according to patient size and/or use of iterative reconstruction technique. CONTRAST:  OMNIPAQUE  IOHEXOL  350 MG/ML SOLN COMPARISON:  None Available. FINDINGS:  VASCULAR Aorta: Normal caliber aorta without aneurysm, dissection, vasculitis or significant stenosis. Celiac: Choose 1 SMA: Choose 1 Renals: Both renal arteries are patent without evidence of aneurysm, dissection, vasculitis, fibromuscular dysplasia or significant stenosis. Accessory right renal artery evident. 2 accessory renal arteries are seen on the left. IMA: Patent without evidence of aneurysm, dissection, vasculitis or significant stenosis. Inflow: Patent without evidence of aneurysm, dissection, vasculitis or significant stenosis. Proximal Outflow: Bilateral common femoral and visualized portions of the superficial and profunda femoral arteries are patent without evidence of aneurysm, dissection, vasculitis or significant stenosis. Veins: No obvious venous abnormality within the limitations of this arterial phase study. Review of the MIP images confirms the above findings. NON-VASCULAR Lower chest: No acute findings. Hepatobiliary: No suspicious focal abnormality within the liver parenchyma. Small area of low attenuation in the anterior liver, adjacent to the falciform ligament, is in a characteristic location for focal fatty deposition. There is no evidence for gallstones, gallbladder wall thickening, or pericholecystic fluid. No intrahepatic or extrahepatic biliary dilation. Pancreas: No focal mass lesion. No dilatation of the main duct. No intraparenchymal cyst. No peripancreatic edema. Spleen: No splenomegaly. No suspicious focal mass lesion. Adrenals/Urinary Tract: No adrenal nodule or mass. Tiny nonobstructing renal stones bilaterally. No evidence  for hydroureter. The urinary bladder appears normal for the degree of distention. Urethral diverticulum evident. Stomach/Bowel: Stomach is unremarkable. No gastric wall thickening. No evidence of outlet obstruction. Duodenum is normally positioned as is the ligament of Treitz. No small bowel wall thickening. No small bowel dilatation. The terminal ileum is  normal. The appendix is not well visualized, but there is no edema or inflammation in the region of the cecal tip to suggest appendicitis. No gross colonic mass. No colonic wall thickening. Lymphatic: No abdominal lymphadenopathy. Upper normal groin lymph nodes are seen bilaterally. Reproductive: The uterus is unremarkable.  There is no adnexal mass. Other: No substantial intraperitoneal free fluid. Musculoskeletal: Low anterior abdominal wall wound again identified. Tiny curvilinear hyperattenuating focus identified along the right margin of the wound (see axial 494/series 10). This persists on the venous imaging but has slightly different configuration without accumulation. Small focus of active arterial phase hemorrhage at this location is not excluded. No discernible feeding vessel. No pre contrast imaging to exclude that this represents a radiopaque foreign body in the wound. No worrisome lytic or sclerotic osseous abnormality. IMPRESSION: 1. Low anterior abdominal wall wound again identified. Tiny curvilinear hyperattenuating focus identified along the right margin of the wound. Small focus of active arterial phase hemorrhage at this location is not excluded. This persists on the venous imaging with slightly different configuration but no accumulation of high density to confirm ongoing bleeding. No discernible feeding vessel. No pre contrast imaging to exclude that this represents a radiopaque foreign body in the wound. 2. Otherwise, no acute findings in the abdomen or pelvis. 3. Tiny nonobstructing renal stones bilaterally. 4. Urethral diverticulum. Electronically Signed   By: Camellia Candle M.D.   On: 02/12/2024 09:03     Labs:   Basic Metabolic Panel: Recent Labs  Lab 02/12/24 0650 02/14/24 0444 02/15/24 0426  NA 135 133* 134*  K 3.5 3.5 4.1  CL 103 111 108  CO2 21* 18* 20*  GLUCOSE 98 84 141*  BUN 12 14 20   CREATININE 0.80 0.81 0.76  CALCIUM  7.8* 7.2* 7.7*  MG 1.5* 1.7 1.6*  PHOS 3.0  2.9  --    GFR Estimated Creatinine Clearance: 76.4 mL/min (by C-G formula based on SCr of 0.76 mg/dL). Liver Function Tests: Recent Labs  Lab 02/12/24 0650 02/14/24 0444  AST 40 17  ALT 11 8  ALKPHOS 116 72  BILITOT 0.7 0.6  PROT 8.0 6.0*  ALBUMIN 2.9* 1.7*   No results for input(s): LIPASE, AMYLASE in the last 168 hours. No results for input(s): AMMONIA in the last 168 hours. Coagulation profile Recent Labs  Lab 02/12/24 0650  INR 1.1    CBC: Recent Labs  Lab 02/12/24 0650 02/12/24 1353 02/12/24 1857 02/13/24 1038 02/14/24 0444 02/15/24 0426  WBC 0.8*  --   --   --  0.9* 0.4*  NEUTROABS 0.3*  --   --   --   --   --   HGB 8.2* 7.4* 7.0* 6.7* 8.9* 8.9*  HCT 26.5* 26.1* 23.8* 22.8* 29.7* 29.7*  MCV 79.8*  --   --   --  83.4 82.7  PLT 93*  --   --   --  71* 71*   Cardiac Enzymes: No results for input(s): CKTOTAL, CKMB, CKMBINDEX, TROPONINI in the last 168 hours. BNP: Invalid input(s): POCBNP CBG: No results for input(s): GLUCAP in the last 168 hours. D-Dimer No results for input(s): DDIMER in the last 72 hours. Hgb A1c No results for  input(s): HGBA1C in the last 72 hours. Lipid Profile No results for input(s): CHOL, HDL, LDLCALC, TRIG, CHOLHDL, LDLDIRECT in the last 72 hours. Thyroid function studies No results for input(s): TSH, T4TOTAL, T3FREE, THYROIDAB in the last 72 hours.  Invalid input(s): FREET3 Anemia work up No results for input(s): VITAMINB12, FOLATE, FERRITIN, TIBC, IRON, RETICCTPCT in the last 72 hours. Microbiology Recent Results (from the past 240 hours)  Blood Culture (routine x 2)     Status: None (Preliminary result)   Collection Time: 02/12/24  6:40 AM   Specimen: BLOOD  Result Value Ref Range Status   Specimen Description   Final    BLOOD LEFT ANTECUBITAL Performed at [facility], [address] [place], [place] [address]    Special Requests   Final    BOTTLES  DRAWN AEROBIC AND ANAEROBIC Blood Culture adequate volume Performed at [facility], [address] [place], [place] [address]    Culture   Final    NO GROWTH 4 DAYS Performed at [facility], [address], [place], [place] [address]    Report Status PENDING  Incomplete  Resp panel by RT-PCR (RSV, Flu A&B, Covid) Anterior Nasal Swab     Status: None   Collection Time: 02/12/24  6:50 AM   Specimen: Anterior Nasal Swab  Result Value Ref Range Status   SARS Coronavirus 2 by RT PCR NEGATIVE NEGATIVE Final    Comment: (NOTE) SARS-CoV-2 target nucleic acids are NOT DETECTED.  The SARS-CoV-2 RNA is generally detectable in upper respiratory specimens during the acute phase of infection. The lowest concentration of SARS-CoV-2 viral copies this assay can detect is 138 copies/mL. A negative result does not preclude SARS-Cov-2 infection and should not be used as the sole basis for treatment or other patient management decisions. A negative result may occur with  improper specimen collection/handling, submission of specimen other than nasopharyngeal swab, presence of viral mutation(s) within the areas targeted by this assay, and inadequate number of viral copies(<138 copies/mL). A negative result must be combined with clinical observations, patient history, and epidemiological information. The expected result is Negative.  Fact Sheet for Patients:  BloggerCourse.com  Fact Sheet for Healthcare Providers:  SeriousBroker.it  This test is no t yet approved or cleared by the [place]  FDA and  has been authorized for detection and/or diagnosis of SARS-CoV-2 by FDA under an Emergency Use Authorization (EUA). This EUA will remain  in effect (meaning this test can be used) for the duration of the COVID-19 declaration under Section 564(b)(1) of the Act, 21 U.S.C.section 360bbb-3(b)(1), unless the authorization is terminated   or revoked sooner.       Influenza A by PCR NEGATIVE NEGATIVE Final   Influenza B by PCR NEGATIVE NEGATIVE Final    Comment: (NOTE) The Xpert Xpress SARS-CoV-2/FLU/RSV plus assay is intended as an aid in the diagnosis of influenza from Nasopharyngeal swab specimens and should not be used as a sole basis for treatment. Nasal washings and aspirates are unacceptable for Xpert Xpress SARS-CoV-2/FLU/RSV testing.  Fact Sheet for Patients: BloggerCourse.com  Fact Sheet for Healthcare Providers: SeriousBroker.it  This test is not yet approved or cleared by the [place]  FDA and has been authorized for detection and/or diagnosis of SARS-CoV-2 by FDA under an Emergency Use Authorization (EUA). This EUA will remain in effect (meaning this test can be used) for the duration of the COVID-19 declaration under Section 564(b)(1) of the Act, 21 U.S.C. section 360bbb-3(b)(1),  unless the authorization is terminated or revoked.     Resp Syncytial Virus by PCR NEGATIVE NEGATIVE Final    Comment: (NOTE) Fact Sheet for Patients: BloggerCourse.com  Fact Sheet for Healthcare Providers: SeriousBroker.it  This test is not yet approved or cleared by the [place]  FDA and has been authorized for detection and/or diagnosis of SARS-CoV-2 by FDA under an Emergency Use Authorization (EUA). This EUA will remain in effect (meaning this test can be used) for the duration of the COVID-19 declaration under Section 564(b)(1) of the Act, 21 U.S.C. section 360bbb-3(b)(1), unless the authorization is terminated or revoked.  Performed at [facility], [address] [place], [place] [address]   Blood Culture (routine x 2)     Status: None (Preliminary result)   Collection Time: 02/12/24  6:52 AM   Specimen: BLOOD  Result Value Ref Range Status   Specimen Description   Final    BLOOD RIGHT  ANTECUBITAL Performed at [facility], [address] [place], [place] [address]    Special Requests   Final    BOTTLES DRAWN AEROBIC AND ANAEROBIC Blood Culture adequate volume Performed at [facility], [address] [place], [place] [address]    Culture   Final    NO GROWTH 4 DAYS Performed at [facility], [address] [place], [place] [address]    Report Status PENDING  Incomplete     Discharge Instructions:   Discharge Instructions     Call MD for:  persistant nausea and vomiting   Complete by: As directed    Call MD for:  severe uncontrolled pain   Complete by: As directed    Call MD for:  temperature >100.4   Complete by: As directed    Diet general   Complete by: As directed    Discharge instructions   Complete by: As directed    Follow-up with your primary care provider in 1 week.  Continue to take medications as prescribed.  Continue wound care dressing at home.  Follow-up with infectious disease clinic as scheduled by the clinic.   Discharge wound care:   Complete by: As directed    Saline moist gauze dressings to abdominal wound; change dail. Cover with ABD pads, secure with mesh underwear change daily. Patient may shower with dressing change   Increase activity slowly   Complete by: As directed       Allergies as of 02/16/2024       Reactions   Bee Venom Anaphylaxis, Shortness Of Breath, Swelling   Ferrlecit  [na Ferric Gluc Cplx In Sucrose] Swelling   Swelling and puffiness in hands and legs after Ferrlecit  250 mg infused over 60 minutes (possibly infusion related reaction)   Morphine  And Codeine Hives, Itching   Just can't take morphine , vicodin is OK        Medication List     TAKE these medications    acetaminophen  325 MG tablet Commonly known as: TYLENOL  Take 2 tablets (650 mg total) by mouth every 6 (six) hours as needed for mild pain, fever or headache.   Biktarvy  50-200-25 MG Tabs tablet Generic drug:  bictegravir-emtricitabine -tenofovir  AF Take 1 tablet by mouth daily.   ciprofloxacin  500 MG tablet Commonly known as: CIPRO  Take 1 tablet (500 mg total) by mouth 2 (two) times daily for 5 days.   doxycycline  100 MG capsule Commonly known as: VIBRAMYCIN  Take 1 capsule (100 mg total) by mouth 2 (two)  times daily for 5 days.   magnesium  oxide 400 (240 Mg) MG tablet Commonly known as: MAG-OX Take 1 tablet (400 mg total) by mouth 2 (two) times daily for 5 days.   ondansetron  4 MG tablet Commonly known as: ZOFRAN  Take 1 tablet (4 mg total) by mouth every 6 (six) hours as needed for nausea.   oxyCODONE  5 MG immediate release tablet Commonly known as: Oxy IR/ROXICODONE  Take 1 tablet (5 mg total) by mouth every 6 (six) hours as needed for moderate pain (pain score 4-6) or severe pain (pain score 7-10).   sulfamethoxazole -trimethoprim  400-80 MG tablet Commonly known as: BACTRIM  Take 1 tablet by mouth daily.               Discharge Care Instructions  (From admission, onward)           Start     Ordered   02/16/24 0000  Discharge wound care:       Comments: Saline moist gauze dressings to abdominal wound; change dail. Cover with ABD pads, secure with mesh underwear change daily. Patient may shower with dressing change   02/16/24 0948            Follow-up Information     Primary care provider Follow up.          Fleeta Rothman, Jomarie SAILOR, MD Follow up in 1 week(s).   Specialty: Infectious Diseases Why: HIV/AIDS Contact information: [address] [place] [address] [phone]                  Time coordinating discharge: 39 minutes  Signed:  Erik Burkett  [facility] 02/16/2024, 1:16 PM

## 2024-02-16 NOTE — Progress Notes (Signed)
 Discharge medications delivered to patient at bedside [facility]

## 2024-02-16 NOTE — Consult Note (Addendum)
 WOC Nurse Consult Note: WOC consult performed remotely utilizing chart review and imaging.  Reason for Consult: Abdominal wound  Wound type: surgical wound post debridement, etiology of wound unknown. Chart review indicates patient has a dermatology appointment and surgical pathology is pending. Pressure Injury POA: NA Measurement: see nursing flow sheets Wound bed: pink/red, moist, with yellow slough noted in center of wound Drainage (amount, consistency, odor) see nursing flow sheets Periwound: intact Dressing procedure/placement/frequency:  Current wound care orders managed and provided by surgery team post debridement.  WOC team will not consult/modify orders at this time, will defer management to surgery team.    WOC team will not follow patient at this time, please re consult if new needs arise.   Thank you,  Doyal Polite, RN, MSN, [facility] WOC Team [phone] (Available Mon-Fri 0700-1500)

## 2024-02-16 NOTE — Progress Notes (Addendum)
 [facility]  Date of Admission:  02/12/2024   Total days of inpatient antibiotics 3  Principal Problem:   Sepsis due to cellulitis (HCC) Active Problems:   AIDS (acquired immune deficiency syndrome) (HCC)   Anxiety and depression   Pancytopenia (HCC)   Hypomagnesemia   Bleeding from wound          Assessment: 43 year old female admitted with: #Cellulitis of chronic lower abdominal wound associated bleeding #Pancytopenia #Febrile neutropenia - CT angio pelvis showed lower anterior abdominal wound again identified.  Tiny hypoattenuating focus along the right margin of the wound.  Otherwise no other acute findings. - Blood cultures remain negative.  Patient has been afebrile on vancomycin  and cefepime  since 8/23. - Taken to the OR on 8/25 for incisional biopsy of abdominal wall wound.  It was noted that there were no areas of necrotic tissue.  There was some frond-like papillary tissue along the lower flap of the wound sent for pathology.  #HIV/AIDS - CD4 less than 35 during hospitalization, viral load pending - Patient has been off of ART for couple months.  Sounds like she was rationing her medications.  She has some medication acquisition issues to stoop experiencing homelessness.  Recommendations: -D/C vancomycin  and cefepime - transition to doxy + cipro  to complete 10 days total of abx -Continue biktarvy  and bactrim . Please engage case management as pt states she was off of art for a couple months due to experiencing homelessness. I emailed additional support through clinic. ID appt with Corean Fireman on 9/22 -Communicated plan with primary -Follow Pathology, no cx sent. I called [facility] who noted the AFB cx and smear form 8/26 is from blood not biopsy/body fluid. Lab is unable to correct the specimen type on their end, they will reach out to [facility]. -Follow HIV RNA, rpr nr, gc urine(please collect)  Microbiology:    Antibiotics: Biktarvy  8/25- Bactrim  ppx Cefepime  8/23- Vancomycin  8/23- Cultures: Blood 8/22 Urine  Other   SUBJECTIVE: Resting in bed. No new complaints.  Interval: Afebrile overnight. Wbc 0.4k  Review of Systems: Review of Systems  All other systems reviewed and are negative.    Scheduled Meds:  bictegravir-emtricitabine -tenofovir  AF  1 tablet Oral Daily   ciprofloxacin   500 mg Oral BID   doxycycline   100 mg Oral Q12H   magnesium  oxide  400 mg Oral BID   silver  nitrate applicators  1 Application Topical Once   sulfamethoxazole -trimethoprim   1 tablet Oral Daily   Continuous Infusions:   PRN Meds:.acetaminophen  **OR** acetaminophen , diphenhydrAMINE , HYDROmorphone  (DILAUDID ) injection, hydrOXYzine , ondansetron  **OR** ondansetron  (ZOFRAN ) IV, oxyCODONE  Allergies  Allergen Reactions   Bee Venom Anaphylaxis, Shortness Of Breath and Swelling   Ferrlecit  [Na Ferric Gluc Cplx In Sucrose] Swelling    Swelling and puffiness in hands and legs after Ferrlecit  250 mg infused over 60 minutes (possibly infusion related reaction)   Morphine  And Codeine Hives and Itching    Just can't take morphine , vicodin is OK    OBJECTIVE: Vitals:   02/15/24 0440 02/15/24 1200 02/15/24 1955 02/16/24 0557  BP: 111/70 121/84 122/81 127/82  Pulse: 64 65 (!) 56 86  Resp: 19 16 16 18   Temp: 98.6 F (37 C) 97.6 F (36.4 C) 97.9 F (36.6 C) 97.9 F (36.6 C)  TempSrc:      SpO2: 100% 100%  100%  Weight:      Height:       Body mass index is 24.56 kg/m.  Physical Exam Constitutional:      Appearance: Normal appearance.  HENT:     Head: Normocephalic and atraumatic.     Right Ear: Tympanic membrane normal.     Left Ear: Tympanic membrane normal.     Nose: Nose normal.     Mouth/Throat:     Mouth: Mucous membranes are moist.  Eyes:     Extraocular Movements: Extraocular movements intact.     Conjunctiva/sclera: Conjunctivae normal.     Pupils: Pupils are equal, round, and  reactive to light.  Cardiovascular:     Rate and Rhythm: Normal rate and regular rhythm.     Heart sounds: No murmur heard.    No friction rub. No gallop.  Pulmonary:     Effort: Pulmonary effort is normal.     Breath sounds: Normal breath sounds.  Abdominal:     General: Abdomen is flat.     Palpations: Abdomen is soft.  Musculoskeletal:        General: Normal range of motion.  Skin:    General: Skin is warm and dry.  Neurological:     General: No focal deficit present.     Mental Status: She is alert and oriented to person, place, and time.  Psychiatric:        Mood and Affect: Mood normal.       Lab Results Lab Results  Component Value Date   WBC 0.4 (LL) 02/15/2024   HGB 8.9 (L) 02/15/2024   HCT 29.7 (L) 02/15/2024   MCV 82.7 02/15/2024   PLT 71 (L) 02/15/2024    Lab Results  Component Value Date   CREATININE 0.76 02/15/2024   BUN 20 02/15/2024   NA 134 (L) 02/15/2024   K 4.1 02/15/2024   CL 108 02/15/2024   CO2 20 (L) 02/15/2024    Lab Results  Component Value Date   ALT 8 02/14/2024   AST 17 02/14/2024   ALKPHOS 72 02/14/2024   BILITOT 0.6 02/14/2024        Loney Stank, MD [facility] 02/16/2024, 10:59 AM Evaluation of this patient requires complex antimicrobial therapy evaluation and counseling + isolation needs for disease transmission risk assessment and mitigation

## 2024-02-17 ENCOUNTER — Other Ambulatory Visit (HOSPITAL_COMMUNITY): Payer: Self-pay

## 2024-02-17 ENCOUNTER — Other Ambulatory Visit: Payer: Self-pay | Admitting: Hematology and Oncology

## 2024-02-17 ENCOUNTER — Telehealth: Payer: Self-pay

## 2024-02-17 DIAGNOSIS — A419 Sepsis, unspecified organism: Secondary | ICD-10-CM | POA: Diagnosis not present

## 2024-02-17 DIAGNOSIS — R634 Abnormal weight loss: Secondary | ICD-10-CM

## 2024-02-17 DIAGNOSIS — L989 Disorder of the skin and subcutaneous tissue, unspecified: Secondary | ICD-10-CM

## 2024-02-17 DIAGNOSIS — C469 Kaposi's sarcoma, unspecified: Secondary | ICD-10-CM

## 2024-02-17 DIAGNOSIS — L0390 Cellulitis, unspecified: Secondary | ICD-10-CM | POA: Diagnosis not present

## 2024-02-17 LAB — SURGICAL PATHOLOGY

## 2024-02-17 MED ORDER — IBUPROFEN 600 MG PO TABS
600.0000 mg | ORAL_TABLET | Freq: Four times a day (QID) | ORAL | 0 refills | Status: DC | PRN
  Filled 2024-02-17: qty 20, 5d supply, fill #0

## 2024-02-17 MED ORDER — PANTOPRAZOLE SODIUM 40 MG PO TBEC
40.0000 mg | DELAYED_RELEASE_TABLET | Freq: Every day | ORAL | 0 refills | Status: DC
  Filled 2024-02-17: qty 30, 30d supply, fill #0

## 2024-02-17 NOTE — Discharge Summary (Signed)
 Patient seen and examined at bedside.  No interval complaints reported.  Did not feel comfortable going home yesterday despite discharge due to dressing changes in pain at the wound site.  Vitals are stable at this time.  No other interval changes.  Please refer to discharge summary dictated 02/16/2024.

## 2024-02-17 NOTE — Progress Notes (Signed)
 Discharge med in a secure bag delivered to pt in room by this RN

## 2024-02-17 NOTE — Plan of Care (Signed)

## 2024-02-17 NOTE — Telephone Encounter (Signed)
 [facility] Patient Advocate Encounter  Insurance verification completed.    The patient is insured through [employer].     Ran test claim for BIKTARVY   The current 30 day co-pay is $0. LF-02/16/24 NF-03/10/24  Ran test claim for DOVATO  The current 30 day co-pay is $0.  Ran test claim for [facility]  The current 30 day co-pay is $0.   We will continue to follow to see if copay assistance is needed.  This test claim was processed through [facility]- copay amounts may vary at other pharmacies due to pharmacy/plan contracts, or as the patient moves through the different stages of their insurance plan.

## 2024-02-18 ENCOUNTER — Other Ambulatory Visit: Payer: Self-pay

## 2024-02-18 ENCOUNTER — Encounter (HOSPITAL_BASED_OUTPATIENT_CLINIC_OR_DEPARTMENT_OTHER): Payer: Self-pay | Admitting: Emergency Medicine

## 2024-02-18 ENCOUNTER — Emergency Department (HOSPITAL_BASED_OUTPATIENT_CLINIC_OR_DEPARTMENT_OTHER)

## 2024-02-18 ENCOUNTER — Emergency Department (HOSPITAL_BASED_OUTPATIENT_CLINIC_OR_DEPARTMENT_OTHER)
Admission: EM | Admit: 2024-02-18 | Discharge: 2024-02-19 | Disposition: A | Discharge status: Home / Self Care | Attending: Emergency Medicine | Admitting: Emergency Medicine

## 2024-02-18 DIAGNOSIS — D72819 Decreased white blood cell count, unspecified: Secondary | ICD-10-CM | POA: Diagnosis not present

## 2024-02-18 DIAGNOSIS — X58XXXA Exposure to other specified factors, initial encounter: Secondary | ICD-10-CM | POA: Diagnosis not present

## 2024-02-18 DIAGNOSIS — R509 Fever, unspecified: Secondary | ICD-10-CM | POA: Insufficient documentation

## 2024-02-18 DIAGNOSIS — S31105A Unspecified open wound of abdominal wall, periumbilic region without penetration into peritoneal cavity, initial encounter: Secondary | ICD-10-CM | POA: Diagnosis not present

## 2024-02-18 DIAGNOSIS — B20 Human immunodeficiency virus [HIV] disease: Secondary | ICD-10-CM | POA: Diagnosis not present

## 2024-02-18 LAB — CBC WITH DIFFERENTIAL/PLATELET
Abs Immature Granulocytes: 0.01 K/uL (ref 0.00–0.07)
Basophils Absolute: 0 K/uL (ref 0.0–0.1)
Basophils Relative: 1 %
Eosinophils Absolute: 0.4 K/uL (ref 0.0–0.5)
Eosinophils Relative: 23 %
HCT: 31.7 % — ABNORMAL LOW (ref 36.0–46.0)
Hemoglobin: 9.9 g/dL — ABNORMAL LOW (ref 12.0–15.0)
Immature Granulocytes: 1 %
Lymphocytes Relative: 49 %
Lymphs Abs: 0.8 K/uL (ref 0.7–4.0)
MCH: 25.2 pg — ABNORMAL LOW (ref 26.0–34.0)
MCHC: 31.2 g/dL (ref 30.0–36.0)
MCV: 80.7 fL (ref 80.0–100.0)
Monocytes Absolute: 0.2 K/uL (ref 0.1–1.0)
Monocytes Relative: 13 %
Neutro Abs: 0.2 K/uL — CL (ref 1.7–7.7)
Neutrophils Relative %: 13 %
Platelets: 109 K/uL — ABNORMAL LOW (ref 150–400)
RBC: 3.93 MIL/uL (ref 3.87–5.11)
RDW: 16.7 % — ABNORMAL HIGH (ref 11.5–15.5)
Smear Review: NORMAL
WBC: 1.6 K/uL — ABNORMAL LOW (ref 4.0–10.5)
nRBC: 0 % (ref 0.0–0.2)

## 2024-02-18 LAB — COMPREHENSIVE METABOLIC PANEL WITH GFR
ALT: 27 U/L (ref 0–44)
AST: 54 U/L — ABNORMAL HIGH (ref 15–41)
Albumin: 3.1 g/dL — ABNORMAL LOW (ref 3.5–5.0)
Alkaline Phosphatase: 122 U/L (ref 38–126)
Anion gap: 10 (ref 5–15)
BUN: 17 mg/dL (ref 6–20)
CO2: 22 mmol/L (ref 22–32)
Calcium: 8.2 mg/dL — ABNORMAL LOW (ref 8.9–10.3)
Chloride: 109 mmol/L (ref 98–111)
Creatinine, Ser: 0.68 mg/dL (ref 0.44–1.00)
GFR, Estimated: >60 mL/min (ref >60)
Glucose, Bld: 102 mg/dL — ABNORMAL HIGH (ref 70–99)
Potassium: 3.9 mmol/L (ref 3.5–5.1)
Sodium: 140 mmol/L (ref 135–145)
Total Bilirubin: 0.4 mg/dL (ref 0.0–1.2)
Total Protein: 8.2 g/dL — ABNORMAL HIGH (ref 6.5–8.1)

## 2024-02-18 LAB — CULTURE, BLOOD (ROUTINE X 2)
Culture: NO GROWTH
Culture: NO GROWTH
Special Requests: ADEQUATE
Special Requests: ADEQUATE

## 2024-02-18 LAB — RESP PANEL BY RT-PCR (RSV, FLU A&B, COVID)  RVPGX2
Influenza A by PCR: NEGATIVE
Influenza B by PCR: NEGATIVE
Resp Syncytial Virus by PCR: NEGATIVE
SARS Coronavirus 2 by RT PCR: NEGATIVE

## 2024-02-18 LAB — LACTIC ACID, PLASMA: Lactic Acid, Venous: 1.5 mmol/L (ref 0.5–1.9)

## 2024-02-18 MED ORDER — HYDROMORPHONE HCL 1 MG/ML IJ SOLN
1.0000 mg | Freq: Once | INTRAMUSCULAR | Status: AC
  Administered 2024-02-18: 1 mg via INTRAVENOUS
  Filled 2024-02-18: qty 1

## 2024-02-18 MED ORDER — ONDANSETRON HCL 4 MG/2ML IJ SOLN
4.0000 mg | Freq: Once | INTRAMUSCULAR | Status: AC
  Administered 2024-02-18: 4 mg via INTRAVENOUS
  Filled 2024-02-18: qty 2

## 2024-02-18 NOTE — ED Triage Notes (Signed)
 Pt had fever took tylenol  at home, was admitted to [facility] for wound biopsy

## 2024-02-18 NOTE — Discharge Instructions (Signed)
 Take Tylenol  650 mg rotated with ibuprofen  400 mg every 3 hours as needed for fever.  Continue antibiotics as previously prescribed.  Follow-up with primary doctor as scheduled, and return to the ER if symptoms significantly worsen or change.

## 2024-02-18 NOTE — Progress Notes (Signed)
 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  I informed the oncology Dr.Gorsuch was on call regarding this.  Surgery team also stated that Dr. Curvin will be reviewing the situation regarding the biopsy. Received notification after patients discharge.

## 2024-02-18 NOTE — ED Provider Notes (Signed)
 [place] [facility] Provider Note   CSN: [id number] Arrival date & time: 02/18/24  2129     Patient presents with: Fever   Beverly [name] is a 43 y.o. female.   Patient is a 43 year old female with past medical history of HIV/AIDS and recent admission for sepsis/worsening abdominal wall wound.  Patient received IV antibiotics, then underwent a biopsy of the abdominal wound.  She was discharged yesterday.  Since returning home, she has been experiencing intermittent fever.  She reports significant temperature tonight, then taking Tylenol  prior to coming here.  Patient arrives here afebrile with temp of 98.3.  Her only complaints are pain from her abdominal wound.  She denies any sore throat, cough, difficulty breathing, abdominal pain, or other complaints.       Prior to Admission medications   Medication Sig Start Date End Date Taking? Authorizing Provider  acetaminophen  (TYLENOL ) 325 MG tablet Take 2 tablets (650 mg total) by mouth every 6 (six) hours as needed for mild pain, fever or headache. 12/24/22   Danton Reyes DASEN, MD  bictegravir-emtricitabine -tenofovir  AF (BIKTARVY ) 50-200-25 MG TABS tablet Take 1 tablet by mouth daily. 02/16/24   Dennise Kingsley, MD  ciprofloxacin  (CIPRO ) 500 MG tablet Take 1 tablet (500 mg total) by mouth 2 (two) times daily for 5 days. 02/16/24 02/21/24  Dennise Kingsley, MD  doxycycline  (VIBRAMYCIN ) 100 MG capsule Take 1 capsule (100 mg total) by mouth 2 (two) times daily for 5 days. 02/16/24 02/21/24  Dennise Kingsley, MD  ibuprofen  (ADVIL ) 600 MG tablet Take 1 tablet (600 mg total) by mouth every 6 (six) hours as needed for mild pain (pain score 1-3) or moderate pain (pain score 4-6). 02/17/24   Pokhrel, Vernal, MD  magnesium  oxide (MAG-OX) 400 (240 Mg) MG tablet Take 1 tablet (400 mg total) by mouth 2 (two) times daily for 5 days. 02/16/24 02/21/24  Pokhrel, Vernal, MD  ondansetron  (ZOFRAN ) 4 MG tablet Take 1 tablet (4 mg total)  by mouth every 6 (six) hours as needed for nausea. 02/16/24   Pokhrel, Vernal, MD  oxyCODONE  (OXY IR/ROXICODONE ) 5 MG immediate release tablet Take 1 tablet (5 mg total) by mouth every 6 (six) hours as needed for moderate pain (pain score 4-6) or severe pain (pain score 7-10). 02/16/24   Pokhrel, Laxman, MD  pantoprazole  (PROTONIX ) 40 MG tablet Take 1 tablet (40 mg total) by mouth daily. 02/17/24 02/16/25  Pokhrel, Vernal, MD  sulfamethoxazole -trimethoprim  (BACTRIM ) 400-80 MG tablet Take 1 tablet by mouth daily. 02/16/24 03/17/24  Dennise Kingsley, MD    Allergies: Bee venom, Ferrlecit  [na ferric gluc cplx in sucrose], and Morphine  and codeine    Review of Systems  All other systems reviewed and are negative.   Updated Vital Signs BP 109/77   Pulse (!) 103   Temp 98.3 F (36.8 C) (Oral)   Resp 18   Ht 5' 1 (1.549 m)   Wt 59 kg   LMP 01/16/2024 (Approximate) Comment: negative pregnancy test 02/12/2024  SpO2 100%   BMI 24.56 kg/m   Physical Exam Vitals and nursing note reviewed.  Constitutional:      General: She is not in acute distress.    Appearance: She is well-developed. She is not diaphoretic.  HENT:     Head: Normocephalic and atraumatic.  Cardiovascular:     Rate and Rhythm: Normal rate and regular rhythm.     Heart sounds: No murmur heard.    No friction rub. No gallop.  Pulmonary:     Effort: Pulmonary effort is normal. No respiratory distress.     Breath sounds: Normal breath sounds. No wheezing.  Abdominal:     General: Bowel sounds are normal. There is no distension.     Palpations: Abdomen is soft.     Tenderness: There is no abdominal tenderness.     Comments: There is an abdominal wall wound that extends across the lower abdomen/suprapubic region.  There are wet-to-dry dressings in place.  There is no surrounding erythema and minimal serous drainage.  Musculoskeletal:        General: Normal range of motion.     Cervical back: Normal range of motion and neck supple.   Skin:    General: Skin is warm and dry.  Neurological:     General: No focal deficit present.     Mental Status: She is alert and oriented to person, place, and time.     (all labs ordered are listed, but only abnormal results are displayed) Labs Reviewed  COMPREHENSIVE METABOLIC PANEL WITH GFR - Abnormal; Notable for the following components:      Result Value   Glucose, Bld 102 (*)    Calcium  8.2 (*)    Total Protein 8.2 (*)    Albumin 3.1 (*)    AST 54 (*)    All other components within normal limits  CBC WITH DIFFERENTIAL/PLATELET - Abnormal; Notable for the following components:   WBC 1.6 (*)    Hemoglobin 9.9 (*)    HCT 31.7 (*)    MCH 25.2 (*)    RDW 16.7 (*)    Platelets 109 (*)    Neutro Abs 0.2 (*)    All other components within normal limits  RESP PANEL BY RT-PCR (RSV, FLU A&B, COVID)  RVPGX2  LACTIC ACID, PLASMA  LACTIC ACID, PLASMA  URINALYSIS, W/ REFLEX TO CULTURE (INFECTION SUSPECTED)  PREGNANCY, URINE    EKG: None  Radiology: DG Chest 2 View Result Date: 02/18/2024 CLINICAL DATA:  Infection.  Fever. EXAM: CHEST - 2 VIEW COMPARISON:  Chest radiograph 11/22/2023 FINDINGS: The cardiomediastinal contours are normal. The lungs are clear. Pulmonary vasculature is normal. No consolidation, pleural effusion, or pneumothorax. No acute osseous abnormalities are seen. IMPRESSION: No active cardiopulmonary disease. Electronically Signed   By: Andrea Gasman M.D.   On: 02/18/2024 22:53     Procedures   Medications Ordered in the ED  ondansetron  (ZOFRAN ) injection 4 mg (has no administration in time range)  HYDROmorphone  (DILAUDID ) injection 1 mg (has no administration in time range)                                    Medical Decision Making Amount and/or Complexity of Data Reviewed Labs: ordered. Radiology: ordered.  Risk Prescription drug management.   Patient presenting with fever and medical history as per HPI.  She arrives here afebrile with  temp of 98.3 and otherwise stable vital signs.  Physical examination is basically unremarkable with the exception of the abdominal wound as described in the physical exam section.  Laboratory studies obtained including CBC, CMP, lactate, and respiratory panel.  White count is 1.6, hemoglobin 9.9, but remainder of laboratory studies otherwise unremarkable.  Lactate is 1.5 and COVID/flu/RSV are all negative.  Chest x-ray showed no acute process.  Patient given Dilaudid  for pain and Zofran  for nausea.  Upon reviewing her laboratory studies, they actually seem improved from  when she was discharged.  She arrives here afebrile with temp of 98.3 and otherwise stable vital signs.  She is nontoxic-appearing and the patient's abdominal wound appears well.  At this point, cause of the fever is unknown, however she is currently on Cipro  and doxycycline , so suspect a viral etiology.  I feel as though patient can safely be discharged with rotating Tylenol  and ibuprofen .  She is to follow-up as previously scheduled.     Final diagnoses:  None    ED Discharge Orders     None          Geroldine Berg, MD 02/18/24 2332

## 2024-02-22 ENCOUNTER — Ambulatory Visit: Admitting: Internal Medicine

## 2024-02-22 ENCOUNTER — Other Ambulatory Visit (HOSPITAL_COMMUNITY): Payer: Self-pay

## 2024-02-22 ENCOUNTER — Other Ambulatory Visit: Payer: Self-pay

## 2024-02-22 ENCOUNTER — Other Ambulatory Visit (HOSPITAL_COMMUNITY)
Admission: RE | Admit: 2024-02-22 | Discharge: 2024-02-22 | Disposition: A | Discharge status: Home / Self Care | Source: Ambulatory Visit | Attending: Internal Medicine | Admitting: Internal Medicine

## 2024-02-22 ENCOUNTER — Encounter: Payer: Self-pay | Admitting: Internal Medicine

## 2024-02-22 VITALS — BP 129/91 | HR 93 | Temp 98.4°F | Resp 16

## 2024-02-22 DIAGNOSIS — D709 Neutropenia, unspecified: Secondary | ICD-10-CM

## 2024-02-22 DIAGNOSIS — Z23 Encounter for immunization: Secondary | ICD-10-CM | POA: Diagnosis not present

## 2024-02-22 DIAGNOSIS — B20 Human immunodeficiency virus [HIV] disease: Secondary | ICD-10-CM | POA: Insufficient documentation

## 2024-02-22 MED ORDER — SULFAMETHOXAZOLE-TRIMETHOPRIM 400-80 MG PO TABS: 1.0000 | ORAL_TABLET | Freq: Every day | ORAL | 3 refills | Status: DC

## 2024-02-22 MED ORDER — BICTEGRAVIR-EMTRICITAB-TENOFOV 50-200-25 MG PO TABS: 1.0000 | ORAL_TABLET | Freq: Every day | ORAL | 3 refills | Status: AC

## 2024-02-22 NOTE — Progress Notes (Signed)
 [facility]     HPI: Beverly [name] is a 43 y.o. female presents for HIV management. She was recently hospitalized for cellulitis of right lower abdominal wound with associated bleeding.  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  Referred to oncology.  During hospitalization she was treated with antibiotics vancomycin  and cefepime  just discharged on Doxy and Cipro  to complete total 10 days.  As far as HIV care she is on Bactrim  Biktarvy .  She had been off of meds at that point for about a couple months.  She states she would like to adhere to regimen but due to experiencing homelessness she has not been able to adhere to medication.  She given ID follow-up with Corean Fireman on 9/22.  Past Medical History:  Diagnosis Date   Abscess    AIDS (HCC) 03/19/2015   Anxiety    Asthma    Blood transfusion without reported diagnosis    Depression    HIV infection (HCC)    Homeless 03/19/2015   states stays with family or friends. Does not rent or own a home, but does not live on streets.   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   Skin ulcer (HCC) 05/06/2015    Past Surgical History:  Procedure Laterality Date   BIOPSY OF SKIN SUBCUTANEOUS TISSUE AND/OR MUCOUS MEMBRANE N/A 02/14/2024   Procedure: Incisional Biopsy of Abdominal Wall Wound;  Surgeon: Curvin Deward MOULD, MD;  Location: [facility];  Service: General;  Laterality: N/A;   CERVICAL CONIZATION W/BX N/A 04/14/2018   Procedure: COLD KNIFE CONIZATION CERVIX WITH BIOPSY;  Surgeon: Anitra Freddy NOVAK, MD;  Location: [facility] [place];  Service: Gynecology;  Laterality: N/A;   CESAREAN SECTION     DILATION AND CURETTAGE OF UTERUS N/A 04/14/2018   Procedure: ENDOCERVICAL CURETTAGE;  Surgeon: Anitra Freddy NOVAK, MD;   Location: [facility];  Service: Gynecology;  Laterality: N/A;   INCISION AND DRAINAGE ABSCESS Right 01/10/2016   Procedure: INCISION AND DRAINAGE RIGHT BUTTOCK, LEFT LABIAL , EXCISION AND DRAINAGE OF  RIGHT AXILLARY ABSCESS;  Surgeon: Morene Olives, MD;  Location: [facility];  Service: General;  Laterality: Right;   TUBAL LIGATION  2005   VULVA MARYBETH BIOPSY N/A 04/14/2018   Procedure: VULVAR BIOPSIES;  Surgeon: Anitra Freddy NOVAK, MD;  Location: [facility];  Service: Gynecology;  Laterality: N/A;    Family History  Problem Relation Age of Onset   Throat cancer Mother        smoker   Throat cancer Father        smoker   Hypertension Father    Cirrhosis Father    Hypertension Other    Cancer Other        smoker   Diabetes Other    Asthma Other    Current Outpatient Medications on File Prior to Visit  Medication Sig Dispense Refill   acetaminophen  (TYLENOL ) 325 MG tablet Take 2 tablets (650 mg total) by mouth every 6 (six) hours as needed for mild pain, fever or headache.     bictegravir-emtricitabine -tenofovir  AF (BIKTARVY ) 50-200-25 MG TABS tablet Take 1 tablet by mouth daily. 30 tablet 0   ibuprofen  (ADVIL ) 600 MG tablet Take 1 tablet (600 mg total) by mouth every 6 (six) hours as needed  for mild pain (pain score 1-3) or moderate pain (pain score 4-6). 20 tablet 0   ondansetron  (ZOFRAN ) 4 MG tablet Take 1 tablet (4 mg total) by mouth every 6 (six) hours as needed for nausea. 20 tablet 0   oxyCODONE  (OXY IR/ROXICODONE ) 5 MG immediate release tablet Take 1 tablet (5 mg total) by mouth every 6 (six) hours as needed for moderate pain (pain score 4-6) or severe pain (pain score 7-10). 15 tablet 0   pantoprazole  (PROTONIX ) 40 MG tablet Take 1 tablet (40 mg total) by mouth daily. 30 tablet 0   sulfamethoxazole -trimethoprim  (BACTRIM ) 400-80 MG tablet Take 1 tablet by mouth daily. 30 tablet 0   No current facility-administered medications on file prior to visit.     Allergies  Allergen Reactions   Bee Venom Anaphylaxis, Shortness Of Breath and Swelling   Ferrlecit  [Na Ferric Gluc Cplx In Sucrose] Swelling    Swelling and puffiness in hands and legs after Ferrlecit  250 mg infused over 60 minutes (possibly infusion related reaction)   Morphine  And Codeine Hives and Itching    Just can't take morphine , vicodin is OK      Lab Results HIV 1 RNA Quant  Date Value  02/13/2024 213,000 copies/mL  01/27/2023 138 Copies/mL (H)  12/25/2022 24,400 copies/mL   CD4 T Cell Abs (/uL)  Date Value  11/22/2023 <35 (L)  10/19/2022 <35 (L)  01/22/2022 <35 (L)   No results found for: HIV1GENOSEQ Lab Results  Component Value Date   WBC 1.6 (L) 02/18/2024   HGB 9.9 (L) 02/18/2024   HCT 31.7 (L) 02/18/2024   MCV 80.7 02/18/2024   PLT 109 (L) 02/18/2024    Lab Results  Component Value Date   CREATININE 0.68 02/18/2024   BUN 17 02/18/2024   NA 140 02/18/2024   K 3.9 02/18/2024   CL 109 02/18/2024   CO2 22 02/18/2024   Lab Results  Component Value Date   ALT 27 02/18/2024   AST 54 (H) 02/18/2024   ALKPHOS 122 02/18/2024   BILITOT 0.4 02/18/2024    Lab Results  Component Value Date   CHOL 108 02/25/2022   TRIG 119 02/25/2022   HDL 43 (L) 02/25/2022   LDLCALC 44 02/25/2022   No results found for: HAV Lab Results  Component Value Date   HEPBSAG NON-REACTIVE 10/19/2022   HEPBSAB NON-REACTIVE 10/19/2022   Lab Results  Component Value Date   HCVAB NON REACTIVE 08/27/2020   Lab Results  Component Value Date   CHLAMYDIAWP Negative 02/25/2022   N Negative 02/25/2022   No results found for: GCPROBEAPT No results found for: QUANTGOLD  Assessment/Plan #HIV/AIDS-CD4 <35, VL 213 k, on 02/13/24 -Continue Biktarvy  and bactrim  -f/u on 9/22 for hiv care, flu shot  #Abdominal wounds with path + inavsive sqm cell ca -F/u with oncology -t teary eyed  #febrile neutorpine 8/23 lbood Cx ng - did not take tylenol  today, pt afebile  today  #Vaccination COVID Flu Monkeypox PCV-today-.  Meningitis utd HepA serolgy HEpB-srolgy  Tdap Shingles  #Health maintenance -Quantiferon today(indeterminate 2022) -RPR nr 02/13/24 -HCV serology -GC -today -Lipid The ASCVD Risk score (Arnett DK, et al., 2019) failed to calculate for the following reasons:   The valid total cholesterol range is 130 to 320 mg/dL  -Dysplasia screen F -Mammogram  -Colonoscopy    Loney Stank, MD [facility]  I have personally spent 45 minutes involved in face-to-face and non-face-to-face activities for this patient on  the day of the visit. Professional time spent includes the following activities: Preparing to see the patient (review of tests), Obtaining and/or reviewing separately obtained history (admission/discharge record), Performing a medically appropriate examination and/or evaluation , Ordering medications/tests/procedures, referring and communicating with other health care professionals, Documenting clinical information in the EMR, Independently interpreting results (not separately reported), Communicating results to the patient/family/caregiver, Counseling and educating the patient/family/caregiver and Care coordination (not separately reported).

## 2024-02-23 LAB — URINE CYTOLOGY ANCILLARY ONLY
Chlamydia: NEGATIVE
Comment: NEGATIVE
Comment: NORMAL
Neisseria Gonorrhea: NEGATIVE

## 2024-02-23 LAB — CYTOLOGY, (ORAL, ANAL, URETHRAL) ANCILLARY ONLY
Chlamydia: NEGATIVE
Comment: NEGATIVE
Comment: NORMAL
Neisseria Gonorrhea: NEGATIVE

## 2024-02-24 LAB — LIPID PANEL
Cholesterol: 104 mg/dL
HDL: 45 mg/dL — ABNORMAL LOW
LDL Cholesterol (Calc): 45 mg/dL
Non-HDL Cholesterol (Calc): 59 mg/dL
Total CHOL/HDL Ratio: 2.3 (calc)
Triglycerides: 65 mg/dL

## 2024-02-24 LAB — CBC WITH DIFFERENTIAL/PLATELET
Absolute Lymphocytes: 474 {cells}/uL — ABNORMAL LOW (ref 850–3900)
Absolute Monocytes: 326 {cells}/uL (ref 200–950)
Basophils Absolute: 10 {cells}/uL (ref 0–200)
Basophils Relative: 0.6 %
Eosinophils Absolute: 277 {cells}/uL (ref 15–500)
Eosinophils Relative: 17.3 %
HCT: 30 % — ABNORMAL LOW (ref 35.0–45.0)
Hemoglobin: 9.2 g/dL — ABNORMAL LOW (ref 11.7–15.5)
MCH: 24.7 pg — ABNORMAL LOW (ref 27.0–33.0)
MCHC: 30.7 g/dL — ABNORMAL LOW (ref 32.0–36.0)
MCV: 80.4 fL (ref 80.0–100.0)
MPV: 13.2 fL — ABNORMAL HIGH (ref 7.5–12.5)
Monocytes Relative: 20.4 %
Neutro Abs: 514 {cells}/uL — ABNORMAL LOW (ref 1500–7800)
Neutrophils Relative %: 32.1 %
Platelets: 182 Thousand/uL (ref 140–400)
RBC: 3.73 Million/uL — ABNORMAL LOW (ref 3.80–5.10)
RDW: 16 % — ABNORMAL HIGH (ref 11.0–15.0)
Total Lymphocyte: 29.6 %
WBC: 1.6 Thousand/uL — ABNORMAL LOW (ref 3.8–10.8)

## 2024-02-24 LAB — COMPLETE METABOLIC PANEL WITHOUT GFR
AG Ratio: 0.6 (calc) — ABNORMAL LOW (ref 1.0–2.5)
ALT: 17 U/L (ref 6–29)
AST: 23 U/L (ref 10–30)
Albumin: 3 g/dL — ABNORMAL LOW (ref 3.6–5.1)
Alkaline phosphatase (APISO): 106 U/L (ref 31–125)
BUN: 13 mg/dL (ref 7–25)
CO2: 25 mmol/L (ref 20–32)
Calcium: 8.2 mg/dL — ABNORMAL LOW (ref 8.6–10.2)
Chloride: 109 mmol/L (ref 98–110)
Creat: 0.59 mg/dL (ref 0.50–0.99)
Globulin: 5 g/dL — ABNORMAL HIGH (ref 1.9–3.7)
Glucose, Bld: 58 mg/dL — ABNORMAL LOW (ref 65–99)
Potassium: 3.5 mmol/L (ref 3.5–5.3)
Sodium: 139 mmol/L (ref 135–146)
Total Bilirubin: 0.4 mg/dL (ref 0.2–1.2)
Total Protein: 8 g/dL (ref 6.1–8.1)

## 2024-02-24 LAB — QUANTIFERON-TB GOLD PLUS
Mitogen-NIL: 0.23 [IU]/mL
NIL: 0.01 [IU]/mL
QuantiFERON-TB Gold Plus: UNDETERMINED — AB
TB1-NIL: 0 [IU]/mL
TB2-NIL: 0 [IU]/mL

## 2024-02-24 LAB — HEPATITIS B SURFACE ANTIBODY,QUALITATIVE: Hep B S Ab: NONREACTIVE

## 2024-02-24 LAB — HIV-1 RNA QUANT-NO REFLEX-BLD
HIV 1 RNA Quant: 9150 {copies}/mL — ABNORMAL HIGH
HIV-1 RNA Quant, Log: 3.96 {Log_copies}/mL — ABNORMAL HIGH

## 2024-02-24 LAB — HEPATITIS A ANTIBODY, TOTAL: Hepatitis A AB,Total: REACTIVE — AB

## 2024-02-24 LAB — T-HELPER CELLS (CD4) COUNT (NOT AT ARMC)
CD4 % Helper T Cell: 1 % — ABNORMAL LOW (ref 33–65)
CD4 T Cell Abs: <35 /uL — ABNORMAL LOW (ref 400–1790)

## 2024-02-24 LAB — HEPATITIS C ANTIBODY: Hepatitis C Ab: NONREACTIVE

## 2024-02-25 ENCOUNTER — Inpatient Hospital Stay: Attending: Oncology | Admitting: Oncology

## 2024-02-25 ENCOUNTER — Inpatient Hospital Stay

## 2024-02-25 VITALS — BP 106/90 | HR 98 | Temp 98.3°F | Resp 18 | Ht 61.0 in | Wt 113.6 lb

## 2024-02-25 DIAGNOSIS — C4492 Squamous cell carcinoma of skin, unspecified: Secondary | ICD-10-CM | POA: Insufficient documentation

## 2024-02-25 DIAGNOSIS — C469 Kaposi's sarcoma, unspecified: Secondary | ICD-10-CM | POA: Diagnosis not present

## 2024-02-25 DIAGNOSIS — D72819 Decreased white blood cell count, unspecified: Secondary | ICD-10-CM | POA: Insufficient documentation

## 2024-02-25 DIAGNOSIS — D649 Anemia, unspecified: Secondary | ICD-10-CM | POA: Insufficient documentation

## 2024-02-25 DIAGNOSIS — B20 Human immunodeficiency virus [HIV] disease: Secondary | ICD-10-CM

## 2024-02-25 DIAGNOSIS — D61818 Other pancytopenia: Secondary | ICD-10-CM | POA: Insufficient documentation

## 2024-02-25 LAB — CBC WITH DIFFERENTIAL (CANCER CENTER ONLY)
Abs Immature Granulocytes: 0.01 K/uL (ref 0.00–0.07)
Basophils Absolute: 0 K/uL (ref 0.0–0.1)
Basophils Relative: 1 %
Eosinophils Absolute: 0.4 K/uL (ref 0.0–0.5)
Eosinophils Relative: 27 %
HCT: 27.7 % — ABNORMAL LOW (ref 36.0–46.0)
Hemoglobin: 8.5 g/dL — ABNORMAL LOW (ref 12.0–15.0)
Immature Granulocytes: 1 %
Lymphocytes Relative: 28 %
Lymphs Abs: 0.5 K/uL — ABNORMAL LOW (ref 0.7–4.0)
MCH: 25.2 pg — ABNORMAL LOW (ref 26.0–34.0)
MCHC: 30.7 g/dL (ref 30.0–36.0)
MCV: 82.2 fL (ref 80.0–100.0)
Monocytes Absolute: 0.2 K/uL (ref 0.1–1.0)
Monocytes Relative: 13 %
Neutro Abs: 0.5 K/uL — ABNORMAL LOW (ref 1.7–7.7)
Neutrophils Relative %: 30 %
Platelet Count: 196 K/uL (ref 150–400)
RBC: 3.37 MIL/uL — ABNORMAL LOW (ref 3.87–5.11)
RDW: 17.2 % — ABNORMAL HIGH (ref 11.5–15.5)
WBC Count: 1.6 K/uL — ABNORMAL LOW (ref 4.0–10.5)
nRBC: 0 % (ref 0.0–0.2)

## 2024-02-25 LAB — CMP (CANCER CENTER ONLY)
ALT: 12 U/L (ref 0–44)
AST: 18 U/L (ref 15–41)
Albumin: 2.7 g/dL — ABNORMAL LOW (ref 3.5–5.0)
Alkaline Phosphatase: 102 U/L (ref 38–126)
Anion gap: 2 — ABNORMAL LOW (ref 5–15)
BUN: 14 mg/dL (ref 6–20)
CO2: 28 mmol/L (ref 22–32)
Calcium: 7.5 mg/dL — ABNORMAL LOW (ref 8.9–10.3)
Chloride: 108 mmol/L (ref 98–111)
Creatinine: 0.57 mg/dL (ref 0.44–1.00)
GFR, Estimated: >60 mL/min
Glucose, Bld: 76 mg/dL (ref 70–99)
Potassium: 3.9 mmol/L (ref 3.5–5.1)
Sodium: 138 mmol/L (ref 135–145)
Total Bilirubin: 0.4 mg/dL (ref 0.0–1.2)
Total Protein: 7.6 g/dL (ref 6.5–8.1)

## 2024-02-25 LAB — LACTATE DEHYDROGENASE: LDH: 143 U/L (ref 98–192)

## 2024-02-25 LAB — TSH: TSH: 1.53 u[IU]/mL (ref 0.350–4.500)

## 2024-02-25 LAB — IRON AND IRON BINDING CAPACITY (CC-WL,HP ONLY)
Iron: 63 ug/dL (ref 28–170)
Saturation Ratios: 24 % (ref 10.4–31.8)
TIBC: 259 ug/dL (ref 250–450)
UIBC: 196 ug/dL (ref 148–442)

## 2024-02-25 LAB — VITAMIN B12: Vitamin B-12: 533 pg/mL (ref 180–914)

## 2024-02-25 LAB — FERRITIN: Ferritin: 92 ng/mL (ref 11–307)

## 2024-02-25 NOTE — Progress Notes (Signed)
 Beverly [name]  HEMATOLOGY CLINIC CONSULTATION NOTE   PATIENT NAME: Beverly [name]   MR#: [id number] DOB: Feb 11, 1981  DATE OF SERVICE: 02/25/2024   Patient Care Team: Patient, No Pcp Per as PCP - General (General Practice) Comer, Lamar ORN, MD as PCP - Infectious Diseases (Infectious Diseases) Comer, Lamar ORN, MD (Internal Medicine) Melvenia Corean SAILOR, NP as Nurse Practitioner (Infectious Diseases)   REASON FOR CONSULTATION/ CHIEF COMPLAINT:  Pancytopenia  ASSESSMENT & PLAN:  Beverly [name] is a 43 y.o. 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    Chronic leukopenia Leukopenia and anemia likely secondary to HIV infection with recent hospitalization due to low blood counts. Blood counts are improving with current treatment. White blood cell count increased from 804 to 1600, hemoglobin from 6.7 to 9.2, and platelet count is normal. Differential diagnosis includes vitamin deficiency, iron deficiency, or autoimmune conditions, but these are less likely. - Order blood tests to evaluate for vitamin deficiency, iron deficiency, and autoimmune conditions - Continue current HIV treatment regimen - Schedule phone call visit in two weeks to discuss blood test results  AIDS (acquired immune deficiency syndrome) (HCC) HIV infection/AIDS with recent lapse in medication adherence. Resumed Biktarvy  and Bactrim , leading to significant improvement in viral load and blood counts. Viral load decreased from 131,000 to 9,000. Importance of medication adherence discussed to prevent opportunistic infections and secondary cancers. HIV infection can lead to decreased immunity, increasing the risk for infections and cancers such as lymphomas. - Continue Biktarvy  and Bactrim  - Educate on importance of medication adherence to  prevent opportunistic infections and secondary cancers  Squamous cell skin cancer Biopsies from anterior abdominal wall came back positive for invasive squamous cell carcinoma, well-differentiated, depth of invasion 2 mm.  Patient has established with dermatology and surgery.  Most likely she will undergo Mohs surgery in the near future.  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  No current issues.    I reviewed lab results and outside records for this visit and discussed relevant results with the patient. Diagnosis, plan of care and treatment options were also discussed in detail with the patient. Opportunity provided to ask questions and answers provided to her apparent satisfaction. Provided instructions to call our clinic with any problems, questions or concerns prior to return visit. I recommended to continue follow-up with PCP and sub-specialists. She verbalized understanding and agreed with the plan. No barriers to learning was detected.  Chinita Patten, MD  02/25/2024 2:48 PM  [name] - A DEPT OF [facility] [address] [place] [address] Dept: [phone] Dept Fax: [phone]   HISTORY OF PRESENT ILLNESS:  Discussed the use of AI scribe software for clinical note transcription with the patient, who gave verbal consent to proceed.  History of Present Illness Beverly [name] is a 43 year old female with HIV who presents with low blood counts. She is accompanied by her daughter, Clemencia, and her case Production designer, theatre/television/film, Administrator, Civil Service. She was referred for evaluation of low blood counts following a recent hospitalization.  She was recently hospitalized due to low blood counts and was diagnosed with squamous cell skin cancer on her abdomen. A biopsy was performed, and she has a dermatology appointment  scheduled for further management.  She has a  history of cervical cancer, identified as precancerous in 2022. A biopsy was performed at that time, and the condition has been monitored without further intervention. No recent radiation or chemotherapy treatments for her cervical condition.  In 2017, she had a sarcoma on her leg, which required a biopsy and skin graft. She recalls the procedure being painful and distressing.  She has a history of HIV, currently managed with Biktarvy . She was off her medication for a period due to instability in her living situation but has resumed treatment recently. Her viral load has decreased significantly since restarting her medication. She also takes Bactrim  to boost her immune system.     She denies fever, cough, diarrhea, or other infectious symptoms.  She denies epistaxis, bloody stool, melena, hematuria, bruising or other bleeding symptoms. She also denies unintentional weight loss, night sweats or other constitutional symptoms.  MEDICAL HISTORY Past Medical History:  Diagnosis Date   Abscess    AIDS (HCC) 03/19/2015   Anxiety    Asthma    Blood transfusion without reported diagnosis    Depression    HIV infection (HCC)    Homeless 03/19/2015   states stays with family or friends. Does not rent or own a home, but does not live on streets.   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   Skin ulcer (HCC) 05/06/2015     SURGICAL HISTORY Past Surgical History:  Procedure Laterality Date   BIOPSY OF SKIN SUBCUTANEOUS TISSUE AND/OR MUCOUS MEMBRANE N/A 02/14/2024   Procedure: Incisional Biopsy of Abdominal Wall Wound;  Surgeon: Curvin Deward MOULD, MD;  Location: [facility];  Service: General;  Laterality: N/A;   CERVICAL CONIZATION W/BX N/A 04/14/2018   Procedure: COLD KNIFE CONIZATION CERVIX WITH BIOPSY;  Surgeon: Anitra Freddy NOVAK, MD;  Location: [facility];  Service:  Gynecology;  Laterality: N/A;   CESAREAN SECTION     DILATION AND CURETTAGE OF UTERUS N/A 04/14/2018   Procedure: ENDOCERVICAL CURETTAGE;  Surgeon: Anitra Freddy NOVAK, MD;  Location: [name];  Service: Gynecology;  Laterality: N/A;   INCISION AND DRAINAGE ABSCESS Right 01/10/2016   Procedure: INCISION AND DRAINAGE RIGHT BUTTOCK, LEFT LABIAL , EXCISION AND DRAINAGE OF  RIGHT AXILLARY ABSCESS;  Surgeon: Morene Olives, MD;  Location: [facility];  Service: General;  Laterality: Right;   TUBAL LIGATION  2005   VULVA MARYBETH BIOPSY N/A 04/14/2018   Procedure: VULVAR BIOPSIES;  Surgeon: Anitra Freddy NOVAK, MD;  Location: [facility];  Service: Gynecology;  Laterality: N/A;     SOCIAL HISTORY: She reports that she has been smoking cigarettes. She has a 0.3 pack-year smoking history. She has never used smokeless tobacco. She reports current alcohol use. She reports current drug use. Frequency: 2.00 times per week. Drugs: Marijuana and MDMA (Ecstacy). Social History   Socioeconomic History   Marital status: Single    Spouse name: Not on file   Number of children: 4   Years of education: Not on file   Highest education level: Not on file  Occupational History   Occupation: Production designer, theatre/television/film at [employer]  Tobacco Use   Smoking status: Every Day    Current packs/day: 0.10    Average packs/day: 0.1 packs/day for 3.0 years (0.3 ttl pk-yrs)    Types: Cigarettes   Smokeless tobacco: Never  Tobacco comments:    1-2 a day  Vaping Use   Vaping status: Never Used  Substance and Sexual Activity   Alcohol use: Yes    Alcohol/week: 0.0 standard drinks of alcohol    Comment: Ocassionally   Drug use: Yes    Frequency: 2.0 times per week    Types: Marijuana, MDMA (Ecstacy)   Sexual activity: Not Currently    Partners: Male    Birth control/protection: Condom, Surgical    Comment: pt. given condoms  Other Topics Concern   Not on file  Social History Narrative   Not on file    Social Drivers of Health   Financial Resource Strain: Not on file  Food Insecurity: Food Insecurity Present (02/25/2024)   Hunger Vital Sign    Worried About Running Out of Food in the Last Year: Sometimes true    Ran Out of Food in the Last Year: Sometimes true  Transportation Needs: No Transportation Needs (02/25/2024)   PRAPARE - Administrator, Civil Service (Medical): No    Lack of Transportation (Non-Medical): No  Recent Concern: Transportation Needs - Unmet Transportation Needs (02/12/2024)   PRAPARE - Administrator, Civil Service (Medical): Yes    Lack of Transportation (Non-Medical): Yes  Physical Activity: Not on file  Stress: Not on file  Social Connections: Not on file  Intimate Partner Violence: Not At Risk (02/25/2024)   Humiliation, Afraid, Rape, and Kick questionnaire    Fear of Current or Ex-Partner: No    Emotionally Abused: No    Physically Abused: No    Sexually Abused: No    FAMILY HISTORY: Her family history includes Asthma in an other family member; Cancer in an other family member; Cirrhosis in her father; Diabetes in an other family member; Hypertension in her father and another family member; Throat cancer in her father and mother.  CURRENT MEDICATIONS   Current Outpatient Medications  Medication Instructions   acetaminophen  (TYLENOL ) 650 mg, Oral, Every 6 hours PRN   bictegravir-emtricitabine -tenofovir  AF (BIKTARVY ) 50-200-25 MG TABS tablet 1 tablet, Oral, Daily   ibuprofen  (ADVIL ) 600 mg, Oral, Every 6 hours PRN   ondansetron  (ZOFRAN ) 4 mg, Oral, Every 6 hours PRN   oxyCODONE  (OXY IR/ROXICODONE ) 5 mg, Oral, Every 6 hours PRN   pantoprazole  (PROTONIX ) 40 mg, Oral, Daily   sulfamethoxazole -trimethoprim  (BACTRIM ) 400-80 MG tablet 1 tablet, Oral, Daily     ALLERGIES  She is allergic to bee venom, milk (cow), ferrlecit  [na ferric gluc cplx in sucrose], and morphine  and codeine.  REVIEW OF SYSTEMS:  Review of Systems - Oncology    Rest of the pertinent review of systems is unremarkable except as mentioned above in HPI.  PHYSICAL EXAMINATION:    Onc Performance Status - 02/25/24 1443       ECOG Perf Status   ECOG Perf Status Restricted in physically strenuous activity but ambulatory and able to carry out work of a light or sedentary nature, e.g., light house work, office work      KPS SCALE   KPS % SCORE Normal activity with effort, some s/s of disease          Vitals:   02/25/24 1442  BP: (!) 106/90  Pulse: 98  Resp: 18  Temp: 98.3 F (36.8 C)  SpO2: 100%   Filed Weights   02/25/24 1442  Weight: 113 lb 9.6 oz (51.5 kg)    Physical Exam Constitutional:      General: She is not  in acute distress.    Appearance: Normal appearance.  HENT:     Head: Normocephalic and atraumatic.  Cardiovascular:     Rate and Rhythm: Normal rate.  Pulmonary:     Effort: Pulmonary effort is normal. No respiratory distress.  Abdominal:     General: There is no distension.  Neurological:     General: No focal deficit present.     Mental Status: She is alert and oriented to person, place, and time.  Psychiatric:     Comments: Anxious, jittery     LABORATORY DATA:   I have reviewed the data as listed.  Results for orders placed or performed in visit on 02/25/24  CBC with Differential ([name] Only)  Result Value Ref Range   WBC Count 1.6 (L) 4.0 - 10.5 K/uL   RBC 3.37 (L) 3.87 - 5.11 MIL/uL   Hemoglobin 8.5 (L) 12.0 - 15.0 g/dL   HCT 72.2 (L) 63.9 - 53.9 %   MCV 82.2 80.0 - 100.0 fL   MCH 25.2 (L) 26.0 - 34.0 pg   MCHC 30.7 30.0 - 36.0 g/dL   RDW 82.7 (H) 88.4 - 84.4 %   Platelet Count 196 150 - 400 K/uL   nRBC 0.0 0.0 - 0.2 %   Neutrophils Relative % 30 %   Neutro Abs 0.5 (L) 1.7 - 7.7 K/uL   Lymphocytes Relative 28 %   Lymphs Abs 0.5 (L) 0.7 - 4.0 K/uL   Monocytes Relative 13 %   Monocytes Absolute 0.2 0.1 - 1.0 K/uL   Eosinophils Relative 27 %   Eosinophils Absolute 0.4 0.0 - 0.5  K/uL   Basophils Relative 1 %   Basophils Absolute 0.0 0.0 - 0.1 K/uL   Immature Granulocytes 1 %   Abs Immature Granulocytes 0.01 0.00 - 0.07 K/uL  CMP ([name] only)  Result Value Ref Range   Sodium 138 135 - 145 mmol/L   Potassium 3.9 3.5 - 5.1 mmol/L   Chloride 108 98 - 111 mmol/L   CO2 28 22 - 32 mmol/L   Glucose, Bld 76 70 - 99 mg/dL   BUN 14 6 - 20 mg/dL   Creatinine 9.42 9.55 - 1.00 mg/dL   Calcium  7.5 (L) 8.9 - 10.3 mg/dL   Total Protein 7.6 6.5 - 8.1 g/dL   Albumin 2.7 (L) 3.5 - 5.0 g/dL   AST 18 15 - 41 U/L   ALT 12 0 - 44 U/L   Alkaline Phosphatase 102 38 - 126 U/L   Total Bilirubin 0.4 0.0 - 1.2 mg/dL   GFR, Estimated >39 >39 mL/min   Anion gap 2 (L) 5 - 15  Lactate dehydrogenase  Result Value Ref Range   LDH 143 98 - 192 U/L  Vitamin B12  Result Value Ref Range   Vitamin B-12 533 180 - 914 pg/mL  ANA w/Reflex if Positive  Result Value Ref Range   Anti Nuclear Antibody (ANA) Negative Negative  Iron and Iron Binding Capacity ([facility] only)  Result Value Ref Range   Iron 63 28 - 170 ug/dL   TIBC 740 749 - 549 ug/dL   Saturation Ratios 24 10.4 - 31.8 %   UIBC 196 148 - 442 ug/dL  Ferritin  Result Value Ref Range   Ferritin 92 11 - 307 ng/mL  TSH  Result Value Ref Range   TSH 1.530 0.350 - 4.500 uIU/mL  Flow Cytometry, Peripheral Blood (Oncology)  Result Value Ref Range   Flow Cytometry SEE SEPARATE  REPORT   Haptoglobin  Result Value Ref Range   Haptoglobin 116 42 - 296 mg/dL  Surgical pathology  Result Value Ref Range   SURGICAL PATHOLOGY      Surgical Pathology CASE: [id number] PATIENT: Kairee Erler Flow Pathology Report     Clinical history: Leukopenia, unspecified type     DIAGNOSIS:  - No abnormal B or T-cell population identified   GATING AND PHENOTYPIC ANALYSIS:  Gated population: Flow cytometric immunophenotyping is performed using antibodies to the antigens listed in the table below. Electronic gates are placed  around a cell cluster displaying light scatter properties corresponding to: lymphocytes  Abnormal Cells in gated population: N/A  Phenotype of Abnormal Cells: N/A                        Lymphoid Antigens       Myeloid Antigens Miscellaneous CD2  tested    CD10 tested    CD11b     ND   CD45 tested CD3  tested    CD19 tested    CD11c     ND   HLA-Dr    ND CD4  tested    CD20 tested    CD13 ND   CD34 tested CD5  tested    CD22 ND   CD14 ND   CD38 tested CD7  tested    CD79b     ND   CD15 ND   CD138     ND CD8  tested    CD103     ND   CD16 ND   TdT  ND CD25 ND   CD200      tested    CD33 ND   CD123     ND TCRab     ND   sKappa    tested    CD64 ND   CD41 ND TCRgd     tested    sLambda   tested    CD117     ND   CD61 ND CD56 tested    cKappa    ND   MPO  ND   CD71 ND CD57 ND   cLambda   ND             CD235a    ND      GROSS DESCRIPTION:  One lavender top tube submitted from [facility] for lymphoma testing.    Final Diagnosis performed by Ilsa Pottier, MD.   Electronically signed 02/28/2024 Technical and / or Professional components performed at [facility], [address], [place], [place] [address].  The above tests were developed and their performance characteristics determined by the [facility] system for the physical and immunophenotypic characterization of cell populations. They have not been cleared by the U.S. Food and Drug administration. The  FDA has determined that such clearance or approval is not necessary. This test is used for clinical purposes. It should not be  regarded as investigational or for  research      RADIOGRAPHIC STUDIES:  I have personally reviewed the radiological images as listed and agreed with the findings in the report.  DG Chest 2 View Result Date: 02/18/2024 CLINICAL DATA:  Infection.  Fever. EXAM: CHEST - 2 VIEW COMPARISON:  Chest radiograph 11/22/2023 FINDINGS: The cardiomediastinal contours are normal. The lungs are  clear. Pulmonary vasculature is normal. No consolidation, pleural effusion, or pneumothorax. No acute osseous abnormalities are seen. IMPRESSION: No active cardiopulmonary disease. Electronically Signed   By:  Andrea Gasman M.D.   On: 02/18/2024 22:53   CT Angio Abd/Pel W and/or Wo Contrast Result Date: 02/12/2024 CLINICAL DATA:  Bleeding wound under the pannus of the suprapubic area. EXAM: CTA ABDOMEN AND PELVIS WITHOUT AND WITH CONTRAST TECHNIQUE: Multidetector CT imaging of the abdomen and pelvis was performed using the standard protocol during bolus administration of intravenous contrast. Multiplanar reconstructed images and MIPs were obtained and reviewed to evaluate the vascular anatomy. RADIATION DOSE REDUCTION: This exam was performed according to the departmental dose-optimization program which includes automated exposure control, adjustment of the mA and/or kV according to patient size and/or use of iterative reconstruction technique. CONTRAST:  OMNIPAQUE  IOHEXOL  350 MG/ML SOLN COMPARISON:  None Available. FINDINGS: VASCULAR Aorta: Normal caliber aorta without aneurysm, dissection, vasculitis or significant stenosis. Celiac: Choose 1 SMA: Choose 1 Renals: Both renal arteries are patent without evidence of aneurysm, dissection, vasculitis, fibromuscular dysplasia or significant stenosis. Accessory right renal artery evident. 2 accessory renal arteries are seen on the left. IMA: Patent without evidence of aneurysm, dissection, vasculitis or significant stenosis. Inflow: Patent without evidence of aneurysm, dissection, vasculitis or significant stenosis. Proximal Outflow: Bilateral common femoral and visualized portions of the superficial and profunda femoral arteries are patent without evidence of aneurysm, dissection, vasculitis or significant stenosis. Veins: No obvious venous abnormality within the limitations of this arterial phase study. Review of the MIP images confirms the above findings.  NON-VASCULAR Lower chest: No acute findings. Hepatobiliary: No suspicious focal abnormality within the liver parenchyma. Small area of low attenuation in the anterior liver, adjacent to the falciform ligament, is in a characteristic location for focal fatty deposition. There is no evidence for gallstones, gallbladder wall thickening, or pericholecystic fluid. No intrahepatic or extrahepatic biliary dilation. Pancreas: No focal mass lesion. No dilatation of the main duct. No intraparenchymal cyst. No peripancreatic edema. Spleen: No splenomegaly. No suspicious focal mass lesion. Adrenals/Urinary Tract: No adrenal nodule or mass. Tiny nonobstructing renal stones bilaterally. No evidence for hydroureter. The urinary bladder appears normal for the degree of distention. Urethral diverticulum evident. Stomach/Bowel: Stomach is unremarkable. No gastric wall thickening. No evidence of outlet obstruction. Duodenum is normally positioned as is the ligament of Treitz. No small bowel wall thickening. No small bowel dilatation. The terminal ileum is normal. The appendix is not well visualized, but there is no edema or inflammation in the region of the cecal tip to suggest appendicitis. No gross colonic mass. No colonic wall thickening. Lymphatic: No abdominal lymphadenopathy. Upper normal groin lymph nodes are seen bilaterally. Reproductive: The uterus is unremarkable.  There is no adnexal mass. Other: No substantial intraperitoneal free fluid. Musculoskeletal: Low anterior abdominal wall wound again identified. Tiny curvilinear hyperattenuating focus identified along the right margin of the wound (see axial 494/series 10). This persists on the venous imaging but has slightly different configuration without accumulation. Small focus of active arterial phase hemorrhage at this location is not excluded. No discernible feeding vessel. No pre contrast imaging to exclude that this represents a radiopaque foreign body in the wound. No  worrisome lytic or sclerotic osseous abnormality. IMPRESSION: 1. Low anterior abdominal wall wound again identified. Tiny curvilinear hyperattenuating focus identified along the right margin of the wound. Small focus of active arterial phase hemorrhage at this location is not excluded. This persists on the venous imaging with slightly different configuration but no accumulation of high density to confirm ongoing bleeding. No discernible feeding vessel. No pre contrast imaging to exclude that this represents a radiopaque foreign body  in the wound. 2. Otherwise, no acute findings in the abdomen or pelvis. 3. Tiny nonobstructing renal stones bilaterally. 4. Urethral diverticulum. Electronically Signed   By: Camellia Candle M.D.   On: 02/12/2024 09:03    Orders Placed This Encounter  Procedures   CBC with Differential ([name] Only)    Standing Status:   Future    Number of Occurrences:   1    Expiration Date:   02/24/2025   CMP ([name] only)    Standing Status:   Future    Number of Occurrences:   1    Expiration Date:   02/24/2025   Lactate dehydrogenase    Standing Status:   Future    Number of Occurrences:   1    Expiration Date:   02/24/2025   Vitamin B12    Standing Status:   Future    Number of Occurrences:   1    Expiration Date:   02/24/2025   ANA w/Reflex if Positive    Standing Status:   Future    Number of Occurrences:   1    Expiration Date:   02/24/2025   Iron and Iron Binding Capacity ([facility] only)    Standing Status:   Future    Number of Occurrences:   1    Expiration Date:   02/24/2025   Ferritin    Standing Status:   Future    Number of Occurrences:   1    Expiration Date:   02/24/2025   TSH    Standing Status:   Future    Number of Occurrences:   1    Expiration Date:   02/24/2025   Flow Cytometry, Peripheral Blood (Oncology)    Standing Status:   Future    Number of Occurrences:   1    Expiration Date:   02/24/2025   Haptoglobin    Standing Status:   Future     Number of Occurrences:   1    Expiration Date:   02/24/2025    Future Appointments  Date Time Provider Department [name]  03/08/2024  3:00 PM Jorah Hua, Chinita, MD [facility] None  03/13/2024  9:15 AM Melvenia Corean SAILOR, NP [facility]  03/20/2024 11:00 AM Orman Erminio POUR, PA-C [facility] None  04/06/2024  3:00 PM Melvenia Corean SAILOR, NP [facility]  08/23/2024  3:00 PM [facility] None  08/23/2024  3:30 PM Merrick Maggio, Chinita, MD [facility] None    I spent a total of 55 minutes during this encounter with the patient including review of chart and various tests results, discussions about plan of care and coordination of care plan.  This document was completed utilizing speech recognition software. Grammatical errors, random word insertions, pronoun errors, and incomplete sentences are an occasional consequence of this system due to software limitations, ambient noise, and hardware issues. Any formal questions or concerns about the content, text or information contained within the body of this dictation should be directly addressed to the provider for clarification.

## 2024-02-25 NOTE — Progress Notes (Signed)
 Social Worker from [employer] already involved and helping patient

## 2024-02-25 NOTE — Progress Notes (Signed)
 Case Manager from community resources already involved and helping patient and is present during this visit and aware of patient's current situation.

## 2024-02-25 NOTE — Progress Notes (Signed)
 Case Manager from community resources already involved and helping patient and is present during this visit and aware of patient's current situation. Case Manager agreed to find counciling for patient

## 2024-02-26 LAB — ANA W/REFLEX IF POSITIVE: Anti Nuclear Antibody (ANA): NEGATIVE

## 2024-02-26 LAB — HAPTOGLOBIN: Haptoglobin: 116 mg/dL (ref 42–296)

## 2024-02-28 LAB — SURGICAL PATHOLOGY

## 2024-02-29 LAB — FLOW CYTOMETRY

## 2024-03-01 ENCOUNTER — Encounter (HOSPITAL_BASED_OUTPATIENT_CLINIC_OR_DEPARTMENT_OTHER): Payer: Self-pay | Admitting: Emergency Medicine

## 2024-03-01 ENCOUNTER — Emergency Department (HOSPITAL_COMMUNITY)

## 2024-03-01 ENCOUNTER — Ambulatory Visit (HOSPITAL_COMMUNITY)
Admission: EM | Admit: 2024-03-01 | Discharge: 2024-03-01 | Disposition: A | Discharge status: Other Acute Inpatient Hospital | Source: Home / Self Care

## 2024-03-01 ENCOUNTER — Encounter (HOSPITAL_COMMUNITY): Payer: Self-pay | Admitting: Emergency Medicine

## 2024-03-01 ENCOUNTER — Emergency Department (HOSPITAL_BASED_OUTPATIENT_CLINIC_OR_DEPARTMENT_OTHER)
Admission: EM | Admit: 2024-03-01 | Discharge: 2024-03-01 | Disposition: A | Discharge status: Other Acute Inpatient Hospital | Attending: Emergency Medicine | Admitting: Emergency Medicine

## 2024-03-01 ENCOUNTER — Emergency Department (EMERGENCY_DEPARTMENT_HOSPITAL)
Admission: EM | Admit: 2024-03-01 | Discharge: 2024-03-03 | Disposition: A | Discharge status: Other Acute Inpatient Hospital | Source: Home / Self Care | Attending: Emergency Medicine | Admitting: Emergency Medicine

## 2024-03-01 ENCOUNTER — Other Ambulatory Visit: Payer: Self-pay

## 2024-03-01 DIAGNOSIS — R45851 Suicidal ideations: Secondary | ICD-10-CM | POA: Diagnosis not present

## 2024-03-01 DIAGNOSIS — L089 Local infection of the skin and subcutaneous tissue, unspecified: Secondary | ICD-10-CM

## 2024-03-01 DIAGNOSIS — F410 Panic disorder [episodic paroxysmal anxiety] without agoraphobia: Secondary | ICD-10-CM | POA: Insufficient documentation

## 2024-03-01 DIAGNOSIS — F332 Major depressive disorder, recurrent severe without psychotic features: Secondary | ICD-10-CM | POA: Diagnosis present

## 2024-03-01 DIAGNOSIS — R748 Abnormal levels of other serum enzymes: Secondary | ICD-10-CM

## 2024-03-01 DIAGNOSIS — Z21 Asymptomatic human immunodeficiency virus [HIV] infection status: Secondary | ICD-10-CM | POA: Insufficient documentation

## 2024-03-01 DIAGNOSIS — S3991XD Unspecified injury of abdomen, subsequent encounter: Secondary | ICD-10-CM | POA: Diagnosis present

## 2024-03-01 DIAGNOSIS — D72819 Decreased white blood cell count, unspecified: Secondary | ICD-10-CM

## 2024-03-01 DIAGNOSIS — G4700 Insomnia, unspecified: Secondary | ICD-10-CM | POA: Insufficient documentation

## 2024-03-01 DIAGNOSIS — Z4800 Encounter for change or removal of nonsurgical wound dressing: Secondary | ICD-10-CM | POA: Insufficient documentation

## 2024-03-01 DIAGNOSIS — J45909 Unspecified asthma, uncomplicated: Secondary | ICD-10-CM | POA: Diagnosis not present

## 2024-03-01 DIAGNOSIS — F17200 Nicotine dependence, unspecified, uncomplicated: Secondary | ICD-10-CM | POA: Diagnosis not present

## 2024-03-01 DIAGNOSIS — S31109D Unspecified open wound of abdominal wall, unspecified quadrant without penetration into peritoneal cavity, subsequent encounter: Secondary | ICD-10-CM | POA: Diagnosis not present

## 2024-03-01 DIAGNOSIS — B20 Human immunodeficiency virus [HIV] disease: Secondary | ICD-10-CM

## 2024-03-01 DIAGNOSIS — F32A Depression, unspecified: Secondary | ICD-10-CM | POA: Insufficient documentation

## 2024-03-01 DIAGNOSIS — X58XXXD Exposure to other specified factors, subsequent encounter: Secondary | ICD-10-CM | POA: Diagnosis not present

## 2024-03-01 DIAGNOSIS — C44529 Squamous cell carcinoma of skin of other part of trunk: Secondary | ICD-10-CM

## 2024-03-01 DIAGNOSIS — C469 Kaposi's sarcoma, unspecified: Secondary | ICD-10-CM | POA: Diagnosis not present

## 2024-03-01 DIAGNOSIS — R109 Unspecified abdominal pain: Secondary | ICD-10-CM | POA: Insufficient documentation

## 2024-03-01 LAB — CBC WITH DIFFERENTIAL/PLATELET
Abs Immature Granulocytes: 0 K/uL (ref 0.00–0.07)
Basophils Absolute: 0 K/uL (ref 0.0–0.1)
Basophils Relative: 1 %
Eosinophils Absolute: 0.2 K/uL (ref 0.0–0.5)
Eosinophils Relative: 20 %
HCT: 35.2 % — ABNORMAL LOW (ref 36.0–46.0)
Hemoglobin: 10.3 g/dL — ABNORMAL LOW (ref 12.0–15.0)
Immature Granulocytes: 0 %
Lymphocytes Relative: 45 %
Lymphs Abs: 0.4 K/uL — ABNORMAL LOW (ref 0.7–4.0)
MCH: 24.8 pg — ABNORMAL LOW (ref 26.0–34.0)
MCHC: 29.3 g/dL — ABNORMAL LOW (ref 30.0–36.0)
MCV: 84.8 fL (ref 80.0–100.0)
Monocytes Absolute: 0.1 K/uL (ref 0.1–1.0)
Monocytes Relative: 12 %
Neutro Abs: 0.2 K/uL — CL (ref 1.7–7.7)
Neutrophils Relative %: 22 %
Platelets: 175 K/uL (ref 150–400)
RBC: 4.15 MIL/uL (ref 3.87–5.11)
RDW: 18.3 % — ABNORMAL HIGH (ref 11.5–15.5)
Smear Review: NORMAL
WBC: 0.9 K/uL — CL (ref 4.0–10.5)
nRBC: 0 % (ref 0.0–0.2)

## 2024-03-01 LAB — COMPREHENSIVE METABOLIC PANEL WITH GFR
ALT: 16 U/L (ref 0–44)
AST: 43 U/L — ABNORMAL HIGH (ref 15–41)
Albumin: 3.2 g/dL — ABNORMAL LOW (ref 3.5–5.0)
Alkaline Phosphatase: 145 U/L — ABNORMAL HIGH (ref 38–126)
Anion gap: 9 (ref 5–15)
BUN: 15 mg/dL (ref 6–20)
CO2: 23 mmol/L (ref 22–32)
Calcium: 8.6 mg/dL — ABNORMAL LOW (ref 8.9–10.3)
Chloride: 108 mmol/L (ref 98–111)
Creatinine, Ser: 0.75 mg/dL (ref 0.44–1.00)
GFR, Estimated: >60 mL/min (ref >60)
Glucose, Bld: 85 mg/dL (ref 70–99)
Potassium: 3.9 mmol/L (ref 3.5–5.1)
Sodium: 140 mmol/L (ref 135–145)
Total Bilirubin: 0.7 mg/dL (ref 0.0–1.2)
Total Protein: 8.3 g/dL — ABNORMAL HIGH (ref 6.5–8.1)

## 2024-03-01 LAB — CBC
HCT: 28.4 % — ABNORMAL LOW (ref 36.0–46.0)
Hemoglobin: 8.7 g/dL — ABNORMAL LOW (ref 12.0–15.0)
MCH: 25.4 pg — ABNORMAL LOW (ref 26.0–34.0)
MCHC: 30.6 g/dL (ref 30.0–36.0)
MCV: 83 fL (ref 80.0–100.0)
Platelets: 232 K/uL (ref 150–400)
RBC: 3.42 MIL/uL — ABNORMAL LOW (ref 3.87–5.11)
RDW: 18.2 % — ABNORMAL HIGH (ref 11.5–15.5)
WBC: 0.9 K/uL — CL (ref 4.0–10.5)
nRBC: 0 % (ref 0.0–0.2)

## 2024-03-01 LAB — RESP PANEL BY RT-PCR (RSV, FLU A&B, COVID)  RVPGX2
Influenza A by PCR: NEGATIVE
Influenza B by PCR: NEGATIVE
Resp Syncytial Virus by PCR: NEGATIVE
SARS Coronavirus 2 by RT PCR: NEGATIVE

## 2024-03-01 LAB — ETHANOL: Alcohol, Ethyl (B): <15 mg/dL (ref <15)

## 2024-03-01 MED ORDER — ACETAMINOPHEN 325 MG PO TABS
650.0000 mg | ORAL_TABLET | Freq: Four times a day (QID) | ORAL | Status: DC | PRN
  Administered 2024-03-02 – 2024-03-03 (×2): 650 mg via ORAL
  Filled 2024-03-01 (×2): qty 2

## 2024-03-01 MED ORDER — IPRATROPIUM BROMIDE 0.02 % IN SOLN
0.5000 mg | Freq: Once | RESPIRATORY_TRACT | Status: AC
  Administered 2024-03-01: 0.5 mg via RESPIRATORY_TRACT
  Filled 2024-03-01: qty 2.5

## 2024-03-01 MED ORDER — ONDANSETRON HCL 4 MG PO TABS: 4.0000 mg | ORAL_TABLET | Freq: Four times a day (QID) | ORAL | Status: DC | PRN

## 2024-03-01 MED ORDER — ATOVAQUONE 750 MG/5ML PO SUSP
1500.0000 mg | Freq: Every day | ORAL | Status: DC
  Administered 2024-03-02 – 2024-03-03 (×2): 1500 mg via ORAL
  Filled 2024-03-01 (×2): qty 10

## 2024-03-01 MED ORDER — OXYCODONE HCL 5 MG PO TABS
5.0000 mg | ORAL_TABLET | Freq: Four times a day (QID) | ORAL | Status: DC | PRN
  Administered 2024-03-01 – 2024-03-03 (×4): 5 mg via ORAL
  Filled 2024-03-01 (×4): qty 1

## 2024-03-01 MED ORDER — ALBUTEROL SULFATE HFA 108 (90 BASE) MCG/ACT IN AERS: 1.0000 | INHALATION_SPRAY | Freq: Four times a day (QID) | RESPIRATORY_TRACT | Status: DC | PRN

## 2024-03-01 MED ORDER — PANTOPRAZOLE SODIUM 40 MG PO TBEC
40.0000 mg | DELAYED_RELEASE_TABLET | Freq: Every day | ORAL | Status: DC
  Administered 2024-03-01 – 2024-03-03 (×3): 40 mg via ORAL
  Filled 2024-03-01 (×3): qty 1

## 2024-03-01 MED ORDER — OXYCODONE-ACETAMINOPHEN 5-325 MG PO TABS
1.0000 | ORAL_TABLET | Freq: Once | ORAL | Status: AC
  Administered 2024-03-01: 1 via ORAL
  Filled 2024-03-01: qty 1

## 2024-03-01 MED ORDER — BICTEGRAVIR-EMTRICITAB-TENOFOV 50-200-25 MG PO TABS
1.0000 | ORAL_TABLET | Freq: Every day | ORAL | Status: DC
Start: 2024-03-01 — End: 2024-03-03
  Administered 2024-03-01 – 2024-03-03 (×3): 1 via ORAL
  Filled 2024-03-01 (×3): qty 1

## 2024-03-01 MED ORDER — ALBUTEROL SULFATE (2.5 MG/3ML) 0.083% IN NEBU
5.0000 mg | INHALATION_SOLUTION | Freq: Once | RESPIRATORY_TRACT | Status: AC
  Administered 2024-03-01: 5 mg via RESPIRATORY_TRACT
  Filled 2024-03-01: qty 6

## 2024-03-01 MED ORDER — ALBUTEROL SULFATE HFA 108 (90 BASE) MCG/ACT IN AERS: 2.0000 | INHALATION_SPRAY | Freq: Four times a day (QID) | RESPIRATORY_TRACT | Status: DC | PRN

## 2024-03-01 MED ORDER — LORAZEPAM 1 MG PO TABS
1.0000 mg | ORAL_TABLET | Freq: Once | ORAL | Status: AC
  Administered 2024-03-01: 1 mg via ORAL
  Filled 2024-03-01: qty 1

## 2024-03-01 MED ORDER — SULFAMETHOXAZOLE-TRIMETHOPRIM 400-80 MG PO TABS
1.0000 | ORAL_TABLET | Freq: Every day | ORAL | Status: DC
  Filled 2024-03-01: qty 1

## 2024-03-01 NOTE — ED Notes (Signed)
 Patient asked me to call her sister, Clotilda Shoulder, at [phone]. to give information regarding the plan for her. I called and she stated she really appreciated the call.

## 2024-03-01 NOTE — ED Triage Notes (Signed)
 Pt state having a break down, Hx of anxiety, states having some housing issues.

## 2024-03-01 NOTE — ED Triage Notes (Addendum)
 Pt presents from [facility]  for eval of wound on abdomen.  Pt was just sent to [facility] earlier but they felt wound needed evaluation.  Pt went to sleep on arrival to ED.

## 2024-03-01 NOTE — ED Notes (Signed)
 Please see note from Psych, Pt meets inpatient criteria.

## 2024-03-01 NOTE — Consult Note (Signed)
 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 Id involved as there was possibly some sign of sirs noted by mental health provider   #HIV/AIDS-CD4 <35, VL 213 k, on 02/13/24 -Continue Biktarvy   -she said she hasn't missed any dose -a little soon to recheck and will defer to outpatient   #leukopenia/anemia #elevated alkphos Wbc up and down since August, and trending down the past week Consider mac, disseminated histo, drug effect from bactrim . The latter on top of the ddx now. The former alk phos? Meds related, mac/histo; very mild and can recheck lft -hold bactrim  -change to atovaquone  for pjp prophy 1500 mg once daily  #?observed sirs I do not appreciate any sign of sepsis at this time The abd wound per her is stable and bleeds not and then. No sign of cellulitis -wound care -supposed to f/u oncology outpatient for management -in the case she has bacteremia from translocation of the wound, will get blood cx -recent cellulitis and she had finished the course so no further at this time    #Inavsive sqm cell ca -F/u with outpatient oncology     Plan: Resume biktarvy  Stop bactrim  Start atovaquone  Blood culture Recheck lft Wound care Maintain standard isolation precaution Discussed with ed provider      ------------------------------------------------ Active Problems:   * No active hospital problems. *    HPI: Beverly [name] is a 43 y.o. 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 Id involved as there was possibly some sign of sirs noted by mental health provider  She recently saw dr Dennise 9/2 and report at that time Taking her abx for abd  wound (doxy/cipro  for 6 more days, after 3 days vanc/cefepime  inpatient 8/25-8/28) Taking art Not yet seen onc  Feeling overwhelmed mentally and wanted to be admitted for psychiatric care so came to ER  Psych provider noted her shaking and was told abd wound bleeding so referred to ER No complaint outside of local sx at the wound Homeless Afebrile Hds Wbc 0.9 (9/5 was 1.6) Alk phos 140s Cxr cardiomegaly   I spoke with dr Doretha in the ED and patient hasn't had fever at all today. Hemodynamics are stable  The wound I examined as below no concern for soft tissue cellulitis   Her wbc count is rather low though and has been trending down Alkphos is a little elevated   No bodily pain complaint, dyspnea, chest pain, headache, n/v/d, joint pain, back pain, dysuria     Hiv labs: Lab Results  Component Value Date   HIV1RNAQUANT 9,150 (H) 02/22/2024   Lab Results  Component Value Date   CD4TCELL 1 (L) 02/22/2024   CD4TABS <35 (L) 02/22/2024   She just restarted ART recently the past few weeks   8/23-8/27 admission abd wound dx'ed with scc     Family History  Problem Relation Age of Onset   Throat cancer Mother        smoker   Throat cancer Father        smoker   Hypertension Father    Cirrhosis Father  Hypertension Other    Cancer Other        smoker   Diabetes Other    Asthma Other     Social History   Tobacco Use   Smoking status: Every Day    Current packs/day: 0.10    Average packs/day: 0.1 packs/day for 3.0 years (0.3 ttl pk-yrs)    Types: Cigarettes   Smokeless tobacco: Never   Tobacco comments:    1-2 a day  Vaping Use   Vaping status: Never Used  Substance Use Topics   Alcohol use: Yes    Alcohol/week: 0.0 standard drinks of alcohol    Comment: Ocassionally   Drug use: Yes    Frequency: 2.0 times per week    Types: Marijuana, MDMA (Ecstacy)    Allergies  Allergen Reactions   Bee Venom Anaphylaxis, Shortness Of Breath and Swelling    Milk (Cow) Dermatitis   Ferrlecit  [Na Ferric Gluc Cplx In Sucrose] Swelling    Swelling and puffiness in hands and legs after Ferrlecit  250 mg infused over 60 minutes (possibly infusion related reaction)   Morphine  And Codeine Hives and Itching    Just can't take morphine , vicodin is OK    Review of Systems: ROS All Other ROS was negative, except mentioned above   Past Medical History:  Diagnosis Date   Abscess    AIDS (HCC) 03/19/2015   Anxiety    Asthma    Blood transfusion without reported diagnosis    Depression    HIV infection (HCC)    Homeless 03/19/2015   states stays with family or friends. Does not rent or own a home, but does not live on streets.   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   OBJECTIVE: Blood pressure (!) 128/94, pulse 93, temperature 98.3 F (36.8 C), temperature source Oral, resp. rate 18, last menstrual period 01/16/2024, SpO2 97%.  Physical Exam  General/constitutional: thin, teary, cooperative, no acute distress HEENT: Normocephalic, PER, Conj Clear, EOMI, Oropharynx clear Neck supple CV: rrr no mrg Lungs: clear to auscultation, normal respiratory effort Abd: Soft, Nontender Ext: no edema Skin: right lower abd/groin is a rather superficial wound with jagged edge and raw appearing base; no pus or surrounding erythema; she reports always has been raw/tender in the area Neuro: nonfocal MSK: no peripheral joint swelling/tenderness/warmth; back spines nontender  Lab Results Lab Results  Component Value Date   WBC 0.9 (LL) 03/01/2024   HGB 8.7 (L) 03/01/2024   HCT 28.4 (L) 03/01/2024   MCV 83.0 03/01/2024   PLT 232 03/01/2024    Lab Results  Component Value Date   CREATININE 0.75 03/01/2024   BUN 15 03/01/2024   NA 140  03/01/2024   K 3.9 03/01/2024   CL 108 03/01/2024   CO2 23 03/01/2024    Lab Results  Component Value Date   ALT 16 03/01/2024   AST 43 (H) 03/01/2024   ALKPHOS 145 (H) 03/01/2024   BILITOT 0.7 03/01/2024      Microbiology: No results found for this or any previous visit (from the past 240 hours).   Serology:    Imaging: If present, new imagings (plain films, ct scans, and mri) have been personally visualized and interpreted; radiology reports have been reviewed. Decision making incorporated into the  Impression / Recommendations.  9/10 cxr 1. Enlarged cardiac silhouette. It pericardial effusion is a clinical concern, echocardiography may be useful. 2. No acute airspace disease.    Beverly ONEIDA Passer, MD [facility] [phone] pager    03/01/2024, 6:51 PM

## 2024-03-01 NOTE — ED Notes (Signed)
 Patient dressed and wanded. 2 bags of patient belongings were placed in locker 28.

## 2024-03-01 NOTE — ED Notes (Signed)
Patient wound redressed.

## 2024-03-01 NOTE — Progress Notes (Signed)
 Inpatient Psychiatric Referral  Patient was recommended inpatient per Luke Lesches, NP. There are no available beds at [facility], per [facility]. Patient was referred to the following out of network facilities:  Destination  Service Provider Address Phone Fax  [facility]  [address], [place] [address] [phone]  [facility]  [address] [place], [place] [address] [phone]  [facility]  [address] [place] [address] [phone]  [facility]  [address], [place] [address] [phone]  [facility]  [address], [place] [address] [phone]  [facility]  [address] [place] [address] [phone]  [facility] EFA[facility]  [address] [place], [place] [phone]  [facility]  [address] [place] [address] [phone]  [facility]  [address] [place] [address] [phone]    Situation ongoing, CSW to continue following and update chart as more information becomes available.  Harrie Sofia MSW, LCSWA 03/01/2024  5:21PM

## 2024-03-01 NOTE — ED Provider Notes (Signed)
 [place] EMERGENCY DEPARTMENT AT [facility] Provider Note   CSN: [id number] Arrival date & time: 03/01/24  1523     Patient presents with: Wound Check   Beverly [name] is a 43 y.o. female.   Pt is a 43y/o female with hx of HIV infection/AIDS recently started antiretroviral therapy in the last month and on chronic bactrim  since d/c from the hospital on doxy and cipro  that she completed 10 days of, anxiety, depression, asthma, active tobacco abuse, homelessness, Kaposi's sarcoma,  HIV neuropathy, chronic lower abdominal wall wound that was biopsied by surgery in aug who is doing wet to dry dressing and presented to the emergency room earlier today and was sent to behavioral health urgent care due to depression and suicidal ideation which is passive.  They did evaluate her and felt that her mental health did require intervention however because of her medical issues they sent her here for further medical evaluation.  Patient reports that she is very sleepy because she has not slept in 4 days but reports that she has had a new cough that is productive occasionally of phlegm with wheezing but reports that she has ran out of her inhaler and has not used anything.  She has not had a fever that she is aware of.  She continues to have pain from her abdominal wound and has been doing wet-to-dry dressing changes.  She states she is almost out of oxycodone  and has been making the pills stretch and not taking them as frequently as she is supposed to.  However she reports otherwise she is taking all the medication she was prescribed from the hospital since leaving and has not missed any doses of the antibiotic or HIV medication.  She is not sure if the wound is getting worse but does report it occasionally will bleed.  No nausea or vomiting.  The history is provided by the patient.  Wound Check       Prior to Admission medications   Medication Sig Start Date End Date Taking? Authorizing  Provider  acetaminophen  (TYLENOL ) 325 MG tablet Take 2 tablets (650 mg total) by mouth every 6 (six) hours as needed for mild pain, fever or headache. 12/24/22   Danton Reyes DASEN, MD  bictegravir-emtricitabine -tenofovir  AF (BIKTARVY ) 50-200-25 MG TABS tablet Take 1 tablet by mouth daily. 02/22/24   Dennise Kingsley, MD  ibuprofen  (ADVIL ) 600 MG tablet Take 1 tablet (600 mg total) by mouth every 6 (six) hours as needed for mild pain (pain score 1-3) or moderate pain (pain score 4-6). 02/17/24   Pokhrel, Vernal, MD  ondansetron  (ZOFRAN ) 4 MG tablet Take 1 tablet (4 mg total) by mouth every 6 (six) hours as needed for nausea. 02/16/24   Pokhrel, Vernal, MD  oxyCODONE  (OXY IR/ROXICODONE ) 5 MG immediate release tablet Take 1 tablet (5 mg total) by mouth every 6 (six) hours as needed for moderate pain (pain score 4-6) or severe pain (pain score 7-10). 02/16/24   Pokhrel, Laxman, MD  pantoprazole  (PROTONIX ) 40 MG tablet Take 1 tablet (40 mg total) by mouth daily. 02/17/24 02/16/25  Pokhrel, Laxman, MD  sulfamethoxazole -trimethoprim  (BACTRIM ) 400-80 MG tablet Take 1 tablet by mouth daily. 02/22/24   Dennise Kingsley, MD    Allergies: Bee venom, Milk (cow), Ferrlecit  [na ferric gluc cplx in sucrose], and Morphine  and codeine    Review of Systems  Updated Vital Signs BP 118/83   Pulse 87   Temp 98.1 F (36.7 C) (Oral)   Resp 18  LMP 01/16/2024 (Approximate) Comment: negative pregnancy test 02/12/2024  SpO2 100%   Physical Exam Vitals and nursing note reviewed.  Constitutional:      General: She is not in acute distress.    Appearance: She is well-developed.     Comments: Patient is sleepy but easily arousable  HENT:     Head: Normocephalic and atraumatic.     Mouth/Throat:     Mouth: Mucous membranes are moist.     Pharynx: Oropharynx is clear.  Eyes:     Conjunctiva/sclera: Conjunctivae normal.     Pupils: Pupils are equal, round, and reactive to light.  Cardiovascular:     Rate and Rhythm: Normal  rate and regular rhythm.     Pulses: Normal pulses.     Heart sounds: No murmur heard. Pulmonary:     Effort: Pulmonary effort is normal. No respiratory distress.     Breath sounds: Wheezing present. No rales.     Comments: Wheezing in all lung fields which is mildly improved with cough. Abdominal:     General: There is no distension.     Palpations: Abdomen is soft.     Tenderness: There is no abdominal tenderness. There is no guarding or rebound.     Comments: Wound present at the pubic symphysis with no deep tunneling.  Granulation tissue present.  No significant erythema but some mild yellow exudate on the bandages.  Area is very tender to the touch.  Musculoskeletal:        General: No tenderness. Normal range of motion.     Cervical back: Normal range of motion and neck supple.     Right lower leg: No edema.     Left lower leg: No edema.  Skin:    General: Skin is warm and dry.     Findings: No erythema or rash.  Neurological:     Mental Status: She is oriented to person, place, and time.  Psychiatric:        Mood and Affect: Mood is depressed.        Behavior: Behavior normal.        Thought Content: Thought content includes suicidal ideation.     (all labs ordered are listed, but only abnormal results are displayed) Labs Reviewed  CBC WITH DIFFERENTIAL/PLATELET - Abnormal; Notable for the following components:      Result Value   WBC 0.9 (*)    Hemoglobin 10.3 (*)    HCT 35.2 (*)    MCH 24.8 (*)    MCHC 29.3 (*)    RDW 18.3 (*)    Neutro Abs 0.2 (*)    Lymphs Abs 0.4 (*)    All other components within normal limits  RESP PANEL BY RT-PCR (RSV, FLU A&B, COVID)  RVPGX2  CULTURE, BLOOD (ROUTINE X 2)  CULTURE, BLOOD (ROUTINE X 2)    EKG: None  Radiology: DG Chest 2 View Result Date: 03/01/2024 CLINICAL DATA:  Cough and weakness EXAM: CHEST - 2 VIEW COMPARISON:  02/18/2024 FINDINGS: Frontal and lateral views of the chest demonstrate interval enlargement of the  cardiac silhouette. No acute airspace disease, effusion, or pneumothorax. No acute bony abnormalities. IMPRESSION: 1. Enlarged cardiac silhouette. It pericardial effusion is a clinical concern, echocardiography may be useful. 2. No acute airspace disease. Electronically Signed   By: Ozell Daring M.D.   On: 03/01/2024 17:38     Procedures   Medications Ordered in the ED  acetaminophen  (TYLENOL ) tablet 650 mg (has no administration in time  range)  bictegravir-emtricitabine -tenofovir  AF (BIKTARVY ) 50-200-25 MG per tablet 1 tablet (1 tablet Oral Given 03/01/24 2020)  ondansetron  (ZOFRAN ) tablet 4 mg (has no administration in time range)  oxyCODONE  (Oxy IR/ROXICODONE ) immediate release tablet 5 mg (5 mg Oral Given 03/01/24 2028)  pantoprazole  (PROTONIX ) EC tablet 40 mg (40 mg Oral Given 03/01/24 2020)  atovaquone  (MEPRON ) 750 MG/5ML suspension 1,500 mg (has no administration in time range)  albuterol  (VENTOLIN  HFA) 108 (90 Base) MCG/ACT inhaler 2 puff (has no administration in time range)  albuterol  (PROVENTIL ) (2.5 MG/3ML) 0.083% nebulizer solution 5 mg (5 mg Nebulization Given 03/01/24 1739)  ipratropium (ATROVENT ) nebulizer solution 0.5 mg (0.5 mg Nebulization Given 03/01/24 1739)  oxyCODONE -acetaminophen  (PERCOCET/ROXICET) 5-325 MG per tablet 1 tablet (1 tablet Oral Given 03/01/24 1739)                                    Medical Decision Making Amount and/or Complexity of Data Reviewed Labs: ordered. Radiology: ordered.  Risk OTC drugs. Prescription drug management.   Pt with multiple medical problems and comorbidities and presenting today with a complaint that caries a high risk for morbidity and mortality.  Here today for medical clearance.  She meets inpatient criteria but based on her other complaints they wanted to make sure that she was medically clear.  [name] vital signs are normal today and she denies a fever.  She has been compliant with her antivirals and her Bactrim .  She is  wheezing on exam and reports a new cough and will ensure no evidence of pneumonia which she would be a high risk for PCP pneumonia.  Also will check for COVID and flu.  Patient does have a history of asthma and has not used her inhaler and was given albuterol  and Atrovent .  Secondly evaluated patient's abdomen and there is a wound there but based on pictures in August when the initial biopsy and wound was evaluated looks improved today.  No surrounding cellulitis noted.  Patient had labs done earlier today which showed an unchanged CMP with normal creatinine and electrolytes, AST is slightly elevated at 43 but ALT is normal, calcium  is improved as well as albumin, CBC with a leukopenia with a white count 0.9 and will get a differential for further evaluation.  Hemoglobin is stable at 8.7 which is similar to what it was when she left the hospital after receiving a blood transfusion.  Patient CD4 counts during her last hospitalization on September 2 had a CD4 count of less than 35.   COVID is neg. 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 Id involved as there was possibly some sign of sirs noted by mental health provider     #HIV/AIDS-CD4 <35, VL 213 k, on 02/13/24 -Continue Biktarvy   -she said she hasn't missed any dose -a little soon to recheck and will defer to outpatient     #leukopenia/anemia #elevated alkphos Wbc up and down since August, and trending down the past week Consider mac, disseminated histo, drug effect from bactrim . The latter on top of the ddx now. The former alk phos? Meds related, mac/histo; very mild and can recheck lft -hold bactrim  -change to atovaquone  for pjp prophy 1500 mg once daily   #?observed sirs I do not appreciate any sign of sepsis at this time The abd wound  per her is stable and bleeds not and then. 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 Meds restarted and instead of bactrim  pt got atovaquone .  I independently interpreted patient's labs and CBC with persistent leukopenia and neutropenia which is similar to results 8 days ago.  COVID neg. I have independently visualized and interpreted pt's images today.  Chest x-ray without acute findings.  Patient is medically clear at this time. Currently TTS is looking for placement.      Final diagnoses:  Chronic abdominal wound infection, subsequent encounter  Suicidal ideation  Depression, unspecified depression type    ED Discharge Orders     None          Doretha Folks, MD 03/01/24 2248

## 2024-03-01 NOTE — Consult Note (Signed)
 [facility]    Date of Admission:  03/01/2024     Reason for Consult: ***    Referring Provider: ***     Lines:  ***  Abx: ***        Assessment: ***   Plan: ***      ------------------------------------------------ Active Problems:   * No active hospital problems. *    HPI: Beverly [name] is a 43 y.o. female ***    Family History  Problem Relation Age of Onset   Throat cancer Mother        smoker   Throat cancer Father        smoker   Hypertension Father    Cirrhosis Father    Hypertension Other    Cancer Other        smoker   Diabetes Other    Asthma Other     Social History   Tobacco Use   Smoking status: Every Day    Current packs/day: 0.10    Average packs/day: 0.1 packs/day for 3.0 years (0.3 ttl pk-yrs)    Types: Cigarettes   Smokeless tobacco: Never   Tobacco comments:    1-2 a day  Vaping Use   Vaping status: Never Used  Substance Use Topics   Alcohol use: Yes    Alcohol/week: 0.0 standard drinks of alcohol    Comment: Ocassionally   Drug use: Yes    Frequency: 2.0 times per week    Types: Marijuana, MDMA (Ecstacy)    Allergies  Allergen Reactions   Bee Venom Anaphylaxis, Shortness Of Breath and Swelling   Milk (Cow) Dermatitis   Ferrlecit  [Na Ferric Gluc Cplx In Sucrose] Swelling    Swelling and puffiness in hands and legs after Ferrlecit  250 mg infused over 60 minutes (possibly infusion related reaction)   Morphine  And Codeine Hives and Itching    Just can't take morphine , vicodin is OK    Review of Systems: ROS All Other ROS was negative, except mentioned above   Past Medical History:  Diagnosis Date   Abscess    AIDS (HCC) 03/19/2015   Anxiety    Asthma    Blood transfusion without reported diagnosis    Depression    HIV infection (HCC)    Homeless 03/19/2015   states stays with family or friends. Does not rent or own a home, but does not live on streets.    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   OBJECTIVE: Blood pressure (!) 120/90, pulse 100, temperature 98.5 F (36.9 C), resp. rate 16, last menstrual period 01/16/2024, SpO2 97%.  Physical Exam    Lab Results Lab Results  Component Value Date   WBC 0.9 (LL) 03/01/2024   HGB 8.7 (L) 03/01/2024   HCT 28.4 (L) 03/01/2024   MCV 83.0 03/01/2024   PLT 232 03/01/2024    Lab Results  Component Value Date   CREATININE 0.75 03/01/2024   BUN 15 03/01/2024   NA 140 03/01/2024   K 3.9 03/01/2024   CL 108 03/01/2024   CO2 23 03/01/2024    Lab Results  Component Value Date   ALT 16 03/01/2024   AST 43 (H) 03/01/2024   ALKPHOS 145 (H) 03/01/2024  BILITOT 0.7 03/01/2024      Microbiology: No results found for this or any previous visit (from the past 240 hours).   Serology:    Imaging: If present, new imagings (plain films, ct scans, and mri) have been personally visualized and interpreted; radiology reports have been reviewed. Decision making incorporated into the Impression / Recommendations.    Constance ONEIDA Passer, MD [facility] [phone] pager    03/01/2024, 2:23 PM

## 2024-03-01 NOTE — ED Provider Notes (Signed)
 [place] [facility] DE[facility]MENT AT [facility] Provider Note   CSN: [id number] Arrival date & time: 03/01/24  [id number]     Patient presents with: Panic Attack   Beverly [name] is a 43 y.o. female.   Patient presents to the emergency department stating that she is having severe anxiety.  Patient reports a lot of stressors.  She reports that she recently lost her father and was recently hospitalized.  During her hospital stay she had a biopsy that showed cancer.  She reports that she started having some anxiety tonight, tried to talk to her roommates but they did not seem receptive so she came here.       Prior to Admission medications   Medication Sig Start Date End Date Taking? Authorizing Provider  acetaminophen  (TYLENOL ) 325 MG tablet Take 2 tablets (650 mg total) by mouth every 6 (six) hours as needed for mild pain, fever or headache. 12/24/22   Danton Reyes DASEN, MD  bictegravir-emtricitabine -tenofovir  AF (BIKTARVY ) 50-200-25 MG TABS tablet Take 1 tablet by mouth daily. 02/22/24   Dennise Kingsley, MD  ibuprofen  (ADVIL ) 600 MG tablet Take 1 tablet (600 mg total) by mouth every 6 (six) hours as needed for mild pain (pain score 1-3) or moderate pain (pain score 4-6). 02/17/24   Pokhrel, Vernal, MD  ondansetron  (ZOFRAN ) 4 MG tablet Take 1 tablet (4 mg total) by mouth every 6 (six) hours as needed for nausea. 02/16/24   Pokhrel, Vernal, MD  oxyCODONE  (OXY IR/ROXICODONE ) 5 MG immediate release tablet Take 1 tablet (5 mg total) by mouth every 6 (six) hours as needed for moderate pain (pain score 4-6) or severe pain (pain score 7-10). 02/16/24   Pokhrel, Laxman, MD  pantoprazole  (PROTONIX ) 40 MG tablet Take 1 tablet (40 mg total) by mouth daily. 02/17/24 02/16/25  Pokhrel, Vernal, MD  sulfamethoxazole -trimethoprim  (BACTRIM ) 400-80 MG tablet Take 1 tablet by mouth daily. 02/22/24   Dennise Kingsley, MD    Allergies: Bee venom, Milk (cow), Ferrlecit  [na ferric gluc cplx in sucrose], and Morphine   and codeine    Review of Systems  Psychiatric/Behavioral:  The patient is nervous/anxious.     Updated Vital Signs BP 114/88 (BP Location: Right Arm)   Pulse (!) 118   Temp 98 F (36.7 C) (Oral)   Resp 18   Ht 5' 1 (1.549 m)   Wt 51.5 kg   LMP 01/16/2024 (Approximate) Comment: negative pregnancy test 02/12/2024  SpO2 100%   BMI 21.45 kg/m   Physical Exam Vitals and nursing note reviewed.  Constitutional:      General: She is not in acute distress.    Appearance: She is well-developed.  HENT:     Head: Normocephalic and atraumatic.     Mouth/Throat:     Mouth: Mucous membranes are moist.  Eyes:     General: Vision grossly intact. Gaze aligned appropriately.     Extraocular Movements: Extraocular movements intact.     Conjunctiva/sclera: Conjunctivae normal.  Cardiovascular:     Rate and Rhythm: Normal rate and regular rhythm.     Pulses: Normal pulses.     Heart sounds: Normal heart sounds, S1 normal and S2 normal. No murmur heard.    No friction rub. No gallop.  Pulmonary:     Effort: Pulmonary effort is normal. No respiratory distress.     Breath sounds: Normal breath sounds.  Abdominal:     General: Bowel sounds are normal.     Palpations: Abdomen is soft.  Tenderness: There is no abdominal tenderness. There is no guarding or rebound.     Hernia: No hernia is present.  Musculoskeletal:        General: No swelling.     Cervical back: Full passive range of motion without pain, normal range of motion and neck supple. No spinous process tenderness or muscular tenderness. Normal range of motion.     Right lower leg: No edema.     Left lower leg: No edema.  Skin:    General: Skin is warm and dry.     Capillary Refill: Capillary refill takes less than 2 seconds.     Findings: No ecchymosis, erythema, rash or wound.  Neurological:     General: No focal deficit present.     Mental Status: She is alert and oriented to person, place, and time.     GCS: GCS eye  subscore is 4. GCS verbal subscore is 5. GCS motor subscore is 6.     Cranial Nerves: Cranial nerves 2-12 are intact.     Sensory: Sensation is intact.     Motor: Motor function is intact.     Coordination: Coordination is intact.  Psychiatric:        Attention and Perception: Attention normal.        Mood and Affect: Mood is anxious. Affect is tearful.        Speech: Speech is rapid and pressured.        Behavior: Behavior is withdrawn.     (all labs ordered are listed, but only abnormal results are displayed) Labs Reviewed  CBC  COMPREHENSIVE METABOLIC PANEL WITH GFR  PREGNANCY, URINE  ETHANOL  URINE DRUG SCREEN    EKG: None  Radiology: No results found.   Procedures   Medications Ordered in the ED  LORazepam  (ATIVAN ) tablet 1 mg (has no administration in time range)                                    Medical Decision Making Amount and/or Complexity of Data Reviewed Labs: ordered.  Risk Prescription drug management. Diagnosis or treatment significantly limited by social determinants of health.   Presents to the emergency department for severe anxiety.  She is tearful and extremely anxious on arrival.  Patient with multiple health issues that are affecting her anxiety.  She also is having issues with her housing and job.  She is not actively homicidal or suicidal.  Patient administered Ativan  for her severe anxiety and will undergo behavioral health evaluation for further recommendations.     Final diagnoses:  Panic attack    ED Discharge Orders     None          Haze Lonni PARAS, MD 03/01/24 [phone]

## 2024-03-01 NOTE — Discharge Instructions (Signed)
Transfer to [place] ED 

## 2024-03-01 NOTE — ED Notes (Signed)
 Pt woken up to obtain vital signs and UA. Pt refused UA sample at this time and was educated on the need for it. EDP aware.
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
## 2024-03-01 NOTE — ED Notes (Addendum)
 Patient transported to [facility] via Psychologist, educational. This nurse went to the assessment room to get pt for discharge. Pt was observed sleeping. While walking to the back sally port pt stumbled a few times. Pt stated that she was lightheaded. States that she is sleepy. Pt reported to security that she hadn't slept in days. Vitals taken: BP 133/95 P-89. This nurse advised [employer] driver to request a wheelchair and assistance when pt arrives at the ED. Understanding voiced.

## 2024-03-01 NOTE — ED Notes (Signed)
 Patient presents with complaints of anxiety. Pt sts she feels alone and afraid. Pt observed having pressured speech. Repetitive. Pt expresses she has lots of barriers in personal life that is contributing to her anxiety at this time.

## 2024-03-01 NOTE — Discharge Instructions (Signed)
 Continue taking all of your medications as prescribed.  You may follow-up with the behavioral health urgent care.

## 2024-03-01 NOTE — ED Provider Notes (Signed)
 Behavioral Health Urgent Care Medical Screening Exam  Patient Name: Beverly [name] MRN: [id number] Date of Evaluation: 03/01/24 Chief Complaint:  I've been depressed' Diagnosis:  Final diagnoses:  Chronic abdominal wound infection, sequela  AIDS (HCC)  Kaposi sarcoma (HCC)    History of Present illness: Beverly [name] is a 43 y.o. female with AIDS, Chronic Abdominal Wound Infection/status post incisional biopsy on 02/14/2024, Kaposi Sarcoma, and new dx of Squamous Cell Cancer, seen initially this morning at [facility] and was diagnosed with experiencing a panic attack. Patient was advised to follow-up here at [facility] for mental health evaluation due to anxiety.  Upon arrival here at [facility] patient reported active suicidal ideations without a specific plan and endorses ongoing grief related to the death of her father on March 14, 2024.  During evaluation patient is observed shaking uncontrollably and subsequently complained of abdominal pain and was noted to have bleeding at the site of a chronic abdominal wound.  Per chart review during patient's admission at [facility] ER patients WBCs 0.9.  Patient is also observed to be shivering uncontrollably and endorses that she feels cold.  Patient is currently afebrile but also was observed to have persistent cough.  Patient has a history of multiple focal pneumonia which was diagnosed back in June 2025.  Patient also was recently hospitalized related to sepsis and when asked if she is currently taking any oral antibiotics for chronic abdominal wound she states that she currently continues to take antibiotics.  Patient is scheduled to follow-up with [facility] in 2 days status post incisional biopsy which was performed while patient was in the hospital when she was subsequently diagnosed with squamous cell cancer involving the lower abdominal wall. Patient endorses that all of these diagnoses and the overall decline in  her health and loss of relationships with her children are contributing to her overall depression, hopelessness and feeling that she would be better off dead.  Patient endorses that she is homeless however currently has shelter as she is rooming with 2 of her coworkers which has also caused increased conflict as they have been spreading information and about her personal life to other people at work which has caused stress and conflict.  Patient denies any recent substance use.  Patient has a history of methamphetamine use along with THC use.  She denies any alcohol use.  Patient denies any specific plan to end her life.  She denies any auditory or visual hallucinations.  She denies any prior psychiatric related admissions.  She is currently not prescribed any psychiatric medications for depression and/or anxiety.  Given patient's overall physical complaints of abdominal pain, active chronic abdominal wound and the fact that patient is severely immunocompromise with neutropenic blood levels patient will require further medical workup to rule out sepsis, pneumonia or active abdominal wound infection.  Patient is being sent to [facility] emergency department, accepting doctor Dr. Rolan Quale.  [name] ID team provider Corean Fireman notified via secure chat whom subsequently notified the Infectious disease hospitalist team that patient will be arriving to [facility], ED.  Flowsheet Row ED from 03/01/2024 in [facility] Emergency Department at [facility] Office Visit from 02/25/2024 in [facility] [place] ED from 02/18/2024 in [facility] Emergency Department at [facility]  C-SSRS RISK CATEGORY No Risk Error: Q3, 4, or 5 should not be populated when Q2 is  No No Risk    Psychiatric Specialty Exam  Presentation  General Appearance:Appropriate for Environment  Eye Contact:Fair  Speech:Slow  Speech Volume:Decreased  Handedness:-- (Did  not assess)   Mood and Affect  Mood:Hopeless; Depressed  Affect:Blunt   Thought Process  Thought Processes:Irrevelant  Descriptions of Associations:Circumstantial  Orientation:Full (Time, Place and Person)  Thought Content:Scattered    Hallucinations:None  Ideas of Reference:None  Suicidal Thoughts:Yes, Passive  Homicidal Thoughts:No   Sensorium  Memory:Immediate Fair; Recent Fair  Judgment:Fair  Insight:Poor   Executive Functions  Concentration:Poor  Attention Span:Fair  Recall:Fair  Fund of Knowledge:Fair  Language:Fair   Psychomotor Activity  Psychomotor Activity:Other (comment) (appears generally weak and slowed)   Assets  Assets:Desire for Improvement; Communication Skills   Sleep  Sleep:Poor  Number of hours: -- (few hours)   Physical Exam: Physical Exam Constitutional:      Appearance: She is underweight. She is ill-appearing (chronically and acutely ill appearing).  HENT:     Nose: No congestion or rhinorrhea.  Eyes:     Pupils: Pupils are equal, round, and reactive to light.  Cardiovascular:     Rate and Rhythm: Tachycardia present.  Pulmonary:     Comments: Productive, rhonchrous cough present  Negative for dyspnea or wheezing  Abdominal:     General: Abdomen is scaphoid. There is no distension.     Palpations: Abdomen is soft.     Tenderness: There is abdominal tenderness in the right lower quadrant, suprapubic area and left lower quadrant.     Comments: Chronic abdominal wound, scant bleeding dressed with gauze packing   Musculoskeletal:     Cervical back: Normal range of motion and neck supple.  Skin:    General: Skin is warm.  Neurological:     General: No focal deficit present.  Psychiatric:        Behavior: Behavior is cooperative.     Review of Systems  Constitutional:  Positive for chills. Negative for fever.  Respiratory:  Positive for cough and sputum production.   Cardiovascular: Negative.    Gastrointestinal:  Positive for abdominal pain.  Psychiatric/Behavioral:  Positive for depression and suicidal ideas. Negative for substance abuse. The patient is nervous/anxious and has insomnia.    Blood pressure (!) 133/95, pulse 90, temperature 98.5 F (36.9 C), resp. rate 16, last menstrual period 01/16/2024, SpO2 97%. There is no height or weight on file to calculate BMI.  Musculoskeletal: Strength & Muscle Tone: within normal limits Gait & Station: normal Patient leans: N/A   [facility] MSE Discharge Disposition for Follow up and Recommendations: Based on my evaluation the patient appears to have an emergency medical condition for which I recommend the patient be transferred to the emergency department for further evaluation.  Patient meets inpatient psychiatric treatment criteria due to SI and Depression, however requires medical evaluation to rule out any complications  Infectious Disease provider notified, Corean Fireman, MD. AC/SW along with Psych TTS provider updated the  patient needing an inpatient bed. Patient should remain in the ED until [facility] bed placement once medically cleared.  Suzen Lesches, NP-C 03/01/2024, 3:38 PM

## 2024-03-01 NOTE — BH Assessment (Signed)
 Clinician spoke to [facility] with IRIS to complete pt's TTS assessment. Clinician provided pt's name, MRN, location, age, room number and provider's name Secure message completed.    Iris coordinator to update secure chat when assessment time and provider are assigned.  Beverly JONETTA Broach, MS, [facility], [facility] Triage Specialist [phone]

## 2024-03-01 NOTE — ED Provider Notes (Signed)
  Physical Exam  BP 119/83 (BP Location: Left Arm)   Pulse 88   Temp 97.9 F (36.6 C) (Oral)   Resp 16   Ht 5' 1 (1.549 m)   Wt 51.5 kg   LMP 01/16/2024 (Approximate) Comment: negative pregnancy test 02/12/2024  SpO2 100%   BMI 21.45 kg/m   Physical Exam  Procedures  Procedures  ED Course / MDM    Medical Decision Making Amount and/or Complexity of Data Reviewed Labs: ordered.  Risk Prescription drug management.   I, Larnell Gravely, assumed care for this patient.  In brief 43 year old female here today because she was feeling anxious.  Patient with history of AIDS, has been compliant with her antiviral medications.  Labs show baseline leukopenia.  She has infectious disease follow-up.  Patient endorsing any SI or HI.  She is appropriate for referral to be HOC.       Gravely Pac T, DO 03/01/24 [phone]

## 2024-03-02 ENCOUNTER — Other Ambulatory Visit (HOSPITAL_COMMUNITY): Payer: Self-pay

## 2024-03-02 LAB — LACTATE DEHYDROGENASE: LDH: 127 U/L (ref 98–192)

## 2024-03-02 MED ORDER — BICTEGRAVIR-EMTRICITAB-TENOFOV 50-200-25 MG PO TABS
ORAL_TABLET | Freq: Every day | ORAL | 0 refills | Status: DC
  Filled 2024-03-02: qty 7, 7d supply, fill #0

## 2024-03-02 NOTE — ED Notes (Signed)
 Pt ask for pain med.  Pt given pain med and given her other meds due.  Pt has slept all morning and is now eating lunch.

## 2024-03-02 NOTE — ED Provider Notes (Signed)
 Emergency Medicine Observation Re-evaluation Note  Beverly [name] is a 43 y.o. female, seen on rounds today.  Pt initially presented to the ED for complaints of Wound Check Currently, the patient is resting.  Pt is here for si.  She has been seen by ID for additional mgmt recs.  She was accepted to [facility] today, but they needed her to go with a 7 day supply of her biktarvy .  Unfortunately, she's recently gotten them filled, but does not have them on her.  Medicaid is refusing to pay for a week supply.  [facility] will check with their pharmacy in the am.    Physical Exam  BP 137/86 (BP Location: Right Arm)   Pulse (!) 108   Temp 99.8 F (37.7 C)   Resp 18   LMP 01/16/2024 (Approximate) Comment: negative pregnancy test 02/12/2024  SpO2 95%  Physical Exam General: asleep Cardiac: rr Lungs: clear Psych: calm  ED Course / MDM  EKG:   I have reviewed the labs performed to date as well as medications administered while in observation.  Recent changes in the last 24 hours include acceptance to [facility].  Plan  Current plan is for inpatient psych.  She will need follow up with ID and with oncology after d/c from psych.    Dean Clarity, MD 03/02/24 [phone]

## 2024-03-02 NOTE — ED Notes (Signed)
 Pt request that her sister Clotilda Shoulder be allowed to be informed of her condition and plan of care. Sister to bring HIV medication some time today.

## 2024-03-02 NOTE — Plan of Care (Signed)
 Id brief note  In further pondering,  Besides holding bactrim  maybe prudent to get oncology input regarding possible bone marrow w/u for this lady.... ddx lymphoma, infiltrative granulomatous process, medication   Will throw in histo, afb blood cx, ldh, blasto  Discussed with ed  provider    -------- Addendum ID clinic f/u extended to 10/16 @ 3pm in anticipation of potential inpatient psych treatment Keepin 9/22 appointment in case she gets out early

## 2024-03-02 NOTE — Progress Notes (Signed)
 Pt has been accepted to [facility] TODAY  03/02/2024: Main campus  Pt meets inpatient criteria per: Suzen Lesches NP  Attending Physician will be Millie Manners, MD  Report can be called to: [phone] (this is a pager, please leave call-back number when giving report)  Pt can arrive after MEDICATION IS PROVIDED   Care Team Notified: April Wilson paramedic, Chesley Holt [facility], Samual Elnor DO  [place] Kindle Strohmeier LCSW-A   03/02/2024 12:30 PM

## 2024-03-02 NOTE — Progress Notes (Signed)
 LCSW Progress Note  [id number]   SYMPHANY FLEISSNER  03/02/2024  12:09 PM  Description:   Inpatient Psychiatric Referral  Patient was recommended inpatient per Elveria Batter (NP). There are no available beds at [facility], per [facility] AC Noberto Qua RN). Patient was referred to the following out of network facilities:   Destination  Service Provider Address Phone Fax  [facility]  [address], [place] [address] [phone]  [facility]  [address] [place], [place] [address] [phone]  [facility]  [address] [place] [address] [phone]  [facility]  [address] [place] [address] [phone]  [facility]  [address] [place] [address] [phone]  [facility]  [address] [place] [address] [phone]  [facility] EFAX  [address] [place], [place] [phone]  [facility]  [address], [place] [address] [phone]  [facility]  [address] [place] [address] [phone]      Situation ongoing, CSW to continue following and update chart as more information becomes available.      [place] Shaden Higley, MSW, LCSW  03/02/2024 12:09 PM

## 2024-03-02 NOTE — ED Notes (Signed)
 Patient has been calm and resting in bed.

## 2024-03-03 ENCOUNTER — Other Ambulatory Visit (HOSPITAL_COMMUNITY): Payer: Self-pay

## 2024-03-03 ENCOUNTER — Other Ambulatory Visit: Payer: Self-pay | Admitting: Pharmacist

## 2024-03-03 ENCOUNTER — Encounter: Payer: Self-pay | Admitting: Oncology

## 2024-03-03 DIAGNOSIS — C4492 Squamous cell carcinoma of skin, unspecified: Secondary | ICD-10-CM | POA: Insufficient documentation

## 2024-03-03 LAB — GLUCOSE 6 PHOSPHATE DEHYDROGENASE
G6PDH: 9.5 U/g{Hb} (ref 4.7–14.6)
Hemoglobin: 7.1 g/dL — ABNORMAL LOW (ref 11.1–15.9)

## 2024-03-03 MED ORDER — BICTEGRAVIR-EMTRICITAB-TENOFOV 50-200-25 MG PO TABS: 1.0000 | ORAL_TABLET | Freq: Every day | ORAL | Status: AC

## 2024-03-03 NOTE — Assessment & Plan Note (Signed)
 Leukopenia and anemia likely secondary to HIV infection with recent hospitalization due to low blood counts. Blood counts are improving with current treatment. White blood cell count increased from 804 to 1600, hemoglobin from 6.7 to 9.2, and platelet count is normal. Differential diagnosis includes vitamin deficiency, iron deficiency, or autoimmune conditions, but these are less likely. - Order blood tests to evaluate for vitamin deficiency, iron deficiency, and autoimmune conditions - Continue current HIV treatment regimen - Schedule phone call visit in two weeks to discuss blood test results

## 2024-03-03 NOTE — Progress Notes (Signed)
 Medication Samples have been provided to the patient.  Drug name: Biktarvy         Strength: 50/200/25 mg       Qty: 28 tablets (4 bottles) LOT: CTGMDA   Exp.Date: 07/27  Samples requested by Dr. Overton.  Dosing instructions: Take one tablet by mouth once daily  The patient has been instructed regarding the correct time, dose, and frequency of taking this medication, including desired effects and most common side effects.   Alan Geralds, PharmD, CPP, BCIDP, AAHIVP Clinical Pharmacist Practitioner Infectious Diseases Clinical Pharmacist [facility] for Infectious Disease

## 2024-03-03 NOTE — Assessment & Plan Note (Signed)
 HIV infection/AIDS with recent lapse in medication adherence. Resumed Biktarvy  and Bactrim , leading to significant improvement in viral load and blood counts. Viral load decreased from 131,000 to 9,000. Importance of medication adherence discussed to prevent opportunistic infections and secondary cancers. HIV infection can lead to decreased immunity, increasing the risk for infections and cancers such as lymphomas. - Continue Biktarvy  and Bactrim  - Educate on importance of medication adherence to prevent opportunistic infections and secondary cancers

## 2024-03-03 NOTE — ED Provider Notes (Signed)
 Emergency Medicine Observation Re-evaluation Note  Beverly [name] is a 43 y.o. female, seen on rounds today.  Pt initially presented to the ED for complaints of Wound Check Currently, the patient is resting. No new complaints   Physical Exam  BP (!) 130/94 (BP Location: Right Arm)   Pulse (!) 103   Temp 99.4 F (37.4 C) (Oral)   Resp 17   LMP 01/16/2024 (Approximate) Comment: negative pregnancy test 02/12/2024  SpO2 94%  Physical Exam General: NAD Cardiac: RR Lungs: non labored Psych: calm   ED Course / MDM  EKG:   I have reviewed the labs performed to date as well as medications administered while in observation.  Recent changes in the last 24 hours include none.  Plan  Current plan is for inpatient psych with outpatient ID/onc follow up after psych stabilization.     Neysa Caron PARAS, DO 03/03/24 [phone]

## 2024-03-03 NOTE — Assessment & Plan Note (Signed)
 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  No current issues.

## 2024-03-03 NOTE — Assessment & Plan Note (Signed)
 Biopsies from anterior abdominal wall came back positive for invasive squamous cell carcinoma, well-differentiated, depth of invasion 2 mm.  Patient has established with dermatology and surgery.  Most likely she will undergo Mohs surgery in the near future.

## 2024-03-03 NOTE — ED Notes (Signed)
 Patient off unit to [facility] per provider. Patient alert, cooperative, no s/s of distress at time of discharge. Discharge information and belongings given to [facility] for facility. Patient ambulatory. Patient off unit in w/c, escorted by NT. Patient transported by [employer].

## 2024-03-03 NOTE — ED Notes (Signed)
 Pt verbalize her HIV medications are at home. She was asked if anyone could go get them for her and she stated  her sister Beverly [name] could. She also asked to have her sister informed about her condition and she was placed on her emergency contact list. Sister stated she would bring the medication tomorrow.

## 2024-03-03 NOTE — ED Notes (Signed)
 Patient is resting comfortably.

## 2024-03-03 NOTE — ED Notes (Signed)
  Pt has been accepted to [facility] TODAY  03/02/2024: Main campus   Pt meets inpatient criteria per: Suzen Lesches NP   Attending Physician will be Millie Manners, MD   Report can be called to: [phone] (this is a pager, please leave call-back number when giving report)   Pt can arrive after MEDICATION IS PROVIDED    Care Team Notified: April Wilson paramedic, Chesley Holt [facility], Samual Elnor DO   [place] LCSW-A    03/02/2024 12:30 PM

## 2024-03-06 LAB — CULTURE, BLOOD (ROUTINE X 2)
Culture: NO GROWTH
Culture: NO GROWTH
Special Requests: ADEQUATE

## 2024-03-06 LAB — HISTOPLASMA ANTIGEN, URINE: Histoplasma Antigen, urine: NEGATIVE (ref <0.2)

## 2024-03-08 ENCOUNTER — Inpatient Hospital Stay: Admitting: Oncology

## 2024-03-08 LAB — BLASTOMYCES ANTIGEN
Blastomyces Antigen: NOT DETECTED ng/mL
Interpretation: NEGATIVE

## 2024-03-08 NOTE — Progress Notes (Signed)
 There was no answer on the phone numbers listed, despite multiple attempts.

## 2024-03-13 ENCOUNTER — Other Ambulatory Visit: Payer: Self-pay | Admitting: Infectious Diseases

## 2024-03-13 ENCOUNTER — Inpatient Hospital Stay: Admitting: Infectious Diseases

## 2024-03-13 DIAGNOSIS — S31109D Unspecified open wound of abdominal wall, unspecified quadrant without penetration into peritoneal cavity, subsequent encounter: Secondary | ICD-10-CM

## 2024-03-13 MED ORDER — DOXYCYCLINE HYCLATE 100 MG PO TABS: 100.0000 mg | ORAL_TABLET | Freq: Two times a day (BID) | ORAL | 0 refills | Status: AC

## 2024-03-13 MED ORDER — AMOXICILLIN-POT CLAVULANATE 875-125 MG PO TABS: 1.0000 | ORAL_TABLET | Freq: Two times a day (BID) | ORAL | 0 refills | Status: AC

## 2024-03-13 NOTE — Progress Notes (Signed)
 Notified that she is having purulent drainage and what sounds like indurated tissue from 2 new open areas on the large abdominal wound.   She needs wound supplies. D/W Shelletha and will rx doxy + augmentin  for her 2 weeks, wound supplies provided and amb ref for wound clinic placed (urgent) to help with recommendations.

## 2024-03-14 ENCOUNTER — Encounter (HOSPITAL_BASED_OUTPATIENT_CLINIC_OR_DEPARTMENT_OTHER): Attending: General Surgery | Admitting: General Surgery

## 2024-03-14 DIAGNOSIS — C44529 Squamous cell carcinoma of skin of other part of trunk: Secondary | ICD-10-CM | POA: Diagnosis not present

## 2024-03-14 DIAGNOSIS — L98499 Non-pressure chronic ulcer of skin of other sites with unspecified severity: Secondary | ICD-10-CM | POA: Insufficient documentation

## 2024-03-14 DIAGNOSIS — B20 Human immunodeficiency virus [HIV] disease: Secondary | ICD-10-CM | POA: Insufficient documentation

## 2024-03-14 DIAGNOSIS — E43 Unspecified severe protein-calorie malnutrition: Secondary | ICD-10-CM | POA: Diagnosis not present

## 2024-03-16 ENCOUNTER — Other Ambulatory Visit: Payer: Self-pay

## 2024-03-16 ENCOUNTER — Encounter: Payer: Self-pay | Admitting: Infectious Diseases

## 2024-03-16 ENCOUNTER — Ambulatory Visit (INDEPENDENT_AMBULATORY_CARE_PROVIDER_SITE_OTHER): Admitting: Infectious Diseases

## 2024-03-16 VITALS — BP 126/79 | HR 92 | Temp 98.0°F | Wt 114.0 lb

## 2024-03-16 DIAGNOSIS — Z23 Encounter for immunization: Secondary | ICD-10-CM

## 2024-03-16 DIAGNOSIS — S31109D Unspecified open wound of abdominal wall, unspecified quadrant without penetration into peritoneal cavity, subsequent encounter: Secondary | ICD-10-CM

## 2024-03-16 DIAGNOSIS — L98499 Non-pressure chronic ulcer of skin of other sites with unspecified severity: Secondary | ICD-10-CM

## 2024-03-16 DIAGNOSIS — B20 Human immunodeficiency virus [HIV] disease: Secondary | ICD-10-CM | POA: Diagnosis not present

## 2024-03-16 MED ORDER — ATOVAQUONE 750 MG/5ML PO SUSP: 1500.0000 mg | Freq: Every day | ORAL | 6 refills | Status: AC

## 2024-03-16 MED ORDER — PREGABALIN 25 MG PO CAPS: 25.0000 mg | ORAL_CAPSULE | Freq: Two times a day (BID) | ORAL | 1 refills | Status: AC

## 2024-03-16 NOTE — Patient Instructions (Addendum)
 CONTINUE  Biktarvy  - once a day - separate from the Boost Breeze drinks - try for 2 drinks a day 2 pm and 8-9 pm before bed   Atovaquone  - 10 mL of the gross liquid stuff once a day - this will be one you probably need to continue for a few months, so please keep refilling it   Doxycycline  and Augmentin  - antibiotics - take with food. Fleurette out the 2 weeks worth.    Will restart the Lyrica  25 mg twice a day for your neuropathy - see if you can get back in with Dr. Bentley at pain management. We can try to help you find a [place], [place]   Gainor, Carlin PARAS, MD - [address]  [place] [address]  Phone: [phone]  Fax: [phone]   Move the follow up with me from 10/14 to 10/23 or 10/24.

## 2024-03-16 NOTE — Progress Notes (Addendum)
 Name: Beverly [name]  DOB: Oct 24, 1980 MRN: [id number] PCP: Melvenia Corean SAILOR, NP     Brief Narrative:  Beverly [name] is a 43 y.o. 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 Dx ~2005.  CD4 nadir < 35 HIV Risk: sexual  History of OIs:  Intake Labs : Hep B sAg (- 2010), sAb (- 2010), cAb (- 2010); Hep A (), Hep C (- 2010) Quantiferon () HLA B*5701 (-) G6PD: ()   Previous Regimens: Atripla Kaletra + Combivir  Darunavir  + Ritonavir  + Truvada   Tivicay  + Descovy  ~2018 Biktarvy     Genotypes: Previously wildtype  12-2019: pending  Subjective:   No chief complaint on file.      Discussed the use of AI scribe software for clinical note transcription with the patient, who gave verbal consent to proceed.  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  She was diagnosed with squamous cell carcinoma following a skin biopsy from a chronic wound on her lower abdomen. The biopsy, conducted on August 25th, confirmed the diagnosis. She has been experiencing increased drainage around the wound and fevers, for which she was started on doxycycline  and Augmentin , taken together twice daily. She was referred to the wound care clinic and saw Dr. Marolyn on September 23rd. The drainage has improved, and the dead skin from one of the wounds came out on its own during a gauze change at home. The other wound was treated by a dermatologist who removed the remaining tissue and applied medicated gauze strips. The fevers have subsided.  She has been taking Biktarvy  for her HIV and was previously on prophylactic Bactrim , which was discontinued during her recent hospital stay due to concerns about her white blood cell count. Her viral load was 213,000 on August 24th and decreased to 9,150 three weeks ago. Her CD4 count remains unmeasurably low at less than 35 cells. Acid-fast blood  cultures have been negative to date.  She experiences nausea, which she attributes to her antibiotics, doxycycline  and Augmentin , taken together twice daily. She sometimes takes them with food, which helps alleviate the nausea. She has been experiencing fevers and chills, which have subsided with the antibiotics.  She has a history of neuropathy in her feet and legs, which has worsened recently, leading to falls. She was previously on Lyrica , which she found helpful for managing her pain and neuropathy, but she did not refill her prescription after her last hospitalization. She has experienced some nausea and adverse reactions to other pain medications.  She is lactose intolerant and was advised to try a non-milk-based high-protein supplement like Boost in juice form. She is currently not taking Bactrim  as it was discontinued during her hospital stay due to concerns about her white blood cell count. She has expressed concerns about her anxiety, which she feels is increasing. She has a prescription for anxiety medication but has not yet started taking it.       Review of Systems  Constitutional:  Positive for malaise/fatigue and weight loss. Negative for chills and fever.  Eyes:  Negative for blurred vision, double vision and photophobia.  Respiratory:  Negative for cough and sputum production.   Gastrointestinal:  Positive for nausea. Negative for abdominal pain and vomiting.  Skin:        Open wound with 2 tunneled areas  Neurological:  Positive for tingling (neuropathy pain). Negative  for headaches.  Psychiatric/Behavioral:  Positive for depression. The patient is nervous/anxious.      Past Medical History:  Diagnosis Date   Abscess    AIDS (HCC) 03/19/2015   Anxiety    Asthma    Blood transfusion without reported diagnosis    Depression    HIV infection (HCC)    Homeless 03/19/2015   states stays with family or friends. Does not rent or own a home, but does not live on streets.    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 (Patient taking differently: Take 650 mg by mouth every 6 (six) hours as needed (for fevers).)     albuterol  (VENTOLIN  HFA) 108 (90 Base) MCG/ACT inhaler Inhale 2 puffs into the lungs every 6 (six) hours as needed for wheezing or shortness of breath.     amoxicillin -clavulanate (AUGMENTIN ) 875-125 MG tablet Take 1 tablet by mouth 2 (two) times daily for 14 days. 28 tablet 0   ATROVENT  HFA 17 MCG/ACT inhaler Inhale 2 puffs into the lungs every 6 (six) hours as needed for wheezing.     bictegravir-emtricitabine -tenofovir  AF (BIKTARVY ) 50-200-25 MG TABS tablet Take 1 tablet by mouth daily. 30 tablet 3   bictegravir-emtricitabine -tenofovir  AF (BIKTARVY ) 50-200-25 MG TABS tablet Take by mouth daily. 7 tablet 0   bictegravir-emtricitabine -tenofovir  AF (BIKTARVY ) 50-200-25 MG TABS tablet Take 1 tablet by mouth daily for 28 days.     doxycycline  (VIBRA -TABS) 100 MG tablet Take 1 tablet (100 mg total) by mouth 2 (two) times daily with a meal for 14 days. 28 tablet 0   ibuprofen  (ADVIL ) 600 MG tablet Take 1 tablet (600 mg total) by mouth every 6 (six) hours as needed for mild pain (pain score 1-3) or moderate pain (pain score 4-6). 20 tablet 0   magnesium  oxide (MAG-OX) 400 (240 Mg) MG tablet Take 400 mg by mouth 2 (two) times daily.     ondansetron  (ZOFRAN ) 4 MG tablet Take 1 tablet (4 mg total) by mouth every 6 (six) hours as needed for nausea. 20 tablet 0   oxyCODONE  (OXY IR/ROXICODONE ) 5 MG immediate release tablet Take 1 tablet (5 mg total) by mouth every 6 (six) hours as needed for moderate pain (pain score  4-6) or severe pain (pain score 7-10). 15 tablet 0   pantoprazole  (PROTONIX ) 40 MG tablet Take 1 tablet (40 mg total) by mouth daily. 30 tablet 0   sulfamethoxazole -trimethoprim  (BACTRIM ) 400-80 MG tablet Take 1 tablet by mouth daily. 30 tablet 3   No facility-administered medications prior to visit.     Allergies  Allergen Reactions   Bee Venom Anaphylaxis, Shortness Of Breath and Swelling   Ferrlecit  [Na Ferric Gluc Cplx In Sucrose] Swelling and Other (See Comments)    Swelling and puffiness in hands and legs after Ferrlecit  250 mg infused over 60 minutes (possibly infusion related reaction)   Latex Hives   Morphine  Hives, Itching and Other (See Comments)    And- Tolerates hydrocodone , oxycodone , and codeine with no issues    Social History   Tobacco Use   Smoking status: Every Day    Current packs/day: 0.10  Average packs/day: 0.1 packs/day for 3.0 years (0.3 ttl pk-yrs)    Types: Cigarettes   Smokeless tobacco: Never   Tobacco comments:    1-2 a day  Vaping Use   Vaping status: Never Used  Substance Use Topics   Alcohol use: Yes    Alcohol/week: 0.0 standard drinks of alcohol    Comment: Ocassionally   Drug use: Yes    Frequency: 2.0 times per week    Types: Marijuana, MDMA (Ecstacy)    Family History  Problem Relation Age of Onset   Throat cancer Mother        smoker   Throat cancer Father        smoker   Hypertension Father    Cirrhosis Father    Hypertension Other    Cancer Other        smoker   Diabetes Other    Asthma Other     Social History   Substance and Sexual Activity  Sexual Activity Not Currently   Partners: Male   Birth control/protection: Condom, Surgical   Comment: pt. given condoms     Objective:   Vitals:   03/16/24 0838  BP: 126/79  Pulse: 92  Temp: 98 F (36.7 C)  TempSrc: Oral  SpO2: 98%  Weight: 114 lb (51.7 kg)     Body mass index is 21.54 kg/m.   Physical Exam Vitals reviewed.  Constitutional:       Appearance: Normal appearance. She is not ill-appearing.  HENT:     Mouth/Throat:     Mouth: Mucous membranes are moist.     Pharynx: Oropharynx is clear.  Eyes:     General: No scleral icterus. Cardiovascular:     Rate and Rhythm: Normal rate.  Pulmonary:     Effort: Pulmonary effort is normal.  Skin:    General: Skin is warm and dry.     Comments: Wound assessed from photos in chart from 48 hours ago at wound care clinic visit.   Neurological:     Mental Status: She is oriented to person, place, and time.  Psychiatric:        Mood and Affect: Mood normal.        Thought Content: Thought content normal.     Lab Results Lab Results  Component Value Date   WBC 0.9 (LL) 03/01/2024   HGB 7.1 (L) 03/02/2024   HCT 35.2 (L) 03/01/2024   MCV 84.8 03/01/2024   PLT 175 03/01/2024    Lab Results  Component Value Date   CREATININE 0.75 03/01/2024   BUN 15 03/01/2024   NA 140 03/01/2024   K 3.9 03/01/2024   CL 108 03/01/2024   CO2 23 03/01/2024    Lab Results  Component Value Date   ALT 16 03/01/2024   AST 43 (H) 03/01/2024   ALKPHOS 145 (H) 03/01/2024   BILITOT 0.7 03/01/2024    Lab Results  Component Value Date   CHOL 104 02/22/2024   HDL 45 (L) 02/22/2024   LDLCALC 45 02/22/2024   TRIG 65 02/22/2024   CHOLHDL 2.3 02/22/2024   HIV 1 RNA Quant  Date Value  02/22/2024 9,150 copies/mL (H)  02/13/2024 213,000 copies/mL  01/27/2023 138 Copies/mL (H)   CD4 T Cell Abs (/uL)  Date Value  02/22/2024 <35 (L)  11/22/2023 <35 (L)  10/19/2022 <35 (L)     Assessment & Plan:   Patient Active Problem List   Diagnosis Date Noted   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/03/2024  Sepsis due to cellulitis (HCC) 02/12/2024   Bleeding from wound 02/12/2024   Housing insecurity 01/01/2023   Transportation insecurity 01/01/2023   Cellulitis 12/21/2022   Hypocalcemia 12/21/2022   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   Nephrolithiasis 12/21/2022   Hypomagnesemia 01/23/2021   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   Nonintractable headache    Generalized lymphadenopathy    Skin rash associated with AIDS (HCC) 08/27/2020   Cellulitis of abdominal wall    HIV disease (HCC)    Weight loss    Nausea & vomiting 05/25/2018   Abdominal wall skin ulcer 03/10/2018   Itching 03/10/2018   History of 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/11/2016   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   Non-healing skin lesion 05/06/2015   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 CD4 count is critically low at less than 35 cells/mm. Continues on Biktarvy  for antiretroviral therapy. Stopped Bactrim  due to concerns of low white blood cell count, switched to Atovaquone  for prophylaxis against opportunistic infections. - Continue Biktarvy  once daily. - Refill Atovaquone  liquid for prophylaxis (10mL daily). - Recheck viral load and blood counts today. - Administer influenza vaccine.  Chronic non-healing lower abdominal wound with bacterial skin and soft tissue infection and squamous cell carcinoma - Chronic wound on lower abdomen with bacterial infection and confirmed squamous cell carcinoma. Recent increase in drainage and fever, now improved with doxycycline  and Augmentin . Wound care managed by wound clinic with weekly follow-ups. Dead tissue removal and packing with iodoform gauze ongoing. No current fevers or chills, indicating improvement. - Continue doxycycline  and  Augmentin  BID for two weeks as planned  - Follow up with wound care clinic weekly. - Monitor for signs of infection: increased pain, drainage, odor, or fever. - Ensure adequate dressing changes as per wound clinic recommendations. - I will reach out to the oncologist who saw her on 9/5 to see what the plan is for [facility] management as to whether she needs rad onc referral or any further staging.  - She has Derm appt on 9/29  Moderate protein-calorie malnutrition - Moderate protein-calorie malnutrition, likely contributing to wound healing difficulties. Lactose intolerance limits dietary options. Recommended high-protein nutritional supplements. - Recommend Boost Breeze nutritional drinks, two per day, separated from Biktarvy  by at least six hours. - Encourage high-protein diet to support wound healing.  Peripheral neuropathy with pain and gait instability - Peripheral neuropathy causing pain and gait instability, exacerbated by recent falls. Previously managed with Lyrica , which was discontinued. Pain management referral needed for further evaluation and treatment. - Refill Lyrica  25 mg BID per last rx  - Provide referral to pain management in [place] if needed (she will reach out to [facility] provider first) - Consider alternative therapies such as vibration therapy for neuropathy.  HCPOA -  Process discussed with Sheletha - paperwork provided via email.  Arranged MyChart proxy access as well.    Corean Fireman, MSN, NP-C [facility] for Infectious Disease [facility]  [email or web address] Pager: [phone] Office:  [phone] [facility] Main Line: [phone] *Secure Chat Communication Welcome  Visit  duration: 31 minutes

## 2024-03-17 ENCOUNTER — Ambulatory Visit: Payer: Self-pay | Admitting: Infectious Diseases

## 2024-03-20 ENCOUNTER — Ambulatory Visit (INDEPENDENT_AMBULATORY_CARE_PROVIDER_SITE_OTHER): Admitting: Physician Assistant

## 2024-03-20 ENCOUNTER — Encounter: Payer: Self-pay | Admitting: Physician Assistant

## 2024-03-20 DIAGNOSIS — L98499 Non-pressure chronic ulcer of skin of other sites with unspecified severity: Secondary | ICD-10-CM | POA: Diagnosis not present

## 2024-03-20 DIAGNOSIS — N9089 Other specified noninflammatory disorders of vulva and perineum: Secondary | ICD-10-CM | POA: Diagnosis not present

## 2024-03-20 DIAGNOSIS — C4492 Squamous cell carcinoma of skin, unspecified: Secondary | ICD-10-CM

## 2024-03-20 NOTE — Patient Instructions (Addendum)
 Referral to plastic surgery - Dr. Lisandro Montorfano   Important Information  Due to recent changes in healthcare laws, you may see results of your pathology and/or laboratory studies on MyChart before the doctors have had a chance to review them. We understand that in some cases there may be results that are confusing or concerning to you. Please understand that not all results are received at the same time and often the doctors may need to interpret multiple results in order to provide you with the best plan of care or course of treatment. Therefore, we ask that you please give us  2 business days to thoroughly review all your results before contacting the office for clarification. Should we see a critical lab result, you will be contacted sooner.   If You Need Anything After Your Visit  If you have any questions or concerns for your doctor, please call our main line at [phone] If no one answers, please leave a voicemail as directed and we will return your call as soon as possible. Messages left after 4 pm will be answered the following business day.   You may also send us  a message via MyChart. We typically respond to MyChart messages within 1-2 business days.  For prescription refills, please ask your pharmacy to contact our office. Our fax number is [phone].  If you have an urgent issue when the clinic is closed that cannot wait until the next business day, you can page your doctor at the number below.    Please note that while we do our best to be available for urgent issues outside of office hours, we are not available 24/7.   If you have an urgent issue and are unable to reach us , you may choose to seek medical care at your doctor's office, retail clinic, urgent care center, or emergency room.  If you have a medical emergency, please immediately call 911 or go to the emergency department. In the event of inclement weather, please call our main line at [phone] for an  update on the status of any delays or closures.  Dermatology Medication Tips: Please keep the boxes that topical medications come in in order to help keep track of the instructions about where and how to use these. Pharmacies typically print the medication instructions only on the boxes and not directly on the medication tubes.   If your medication is too expensive, please contact our office at [phone] or send us  a message through MyChart.   We are unable to tell what your co-pay for medications will be in advance as this is different depending on your insurance coverage. However, we may be able to find a substitute medication at lower cost or fill out paperwork to get insurance to cover a needed medication.   If a prior authorization is required to get your medication covered by your insurance company, please allow us  1-2 business days to complete this process.  Drug prices often vary depending on where the prescription is filled and some pharmacies may offer cheaper prices.  The website www.goodrx.com contains coupons for medications through different pharmacies. The prices here do not account for what the cost may be with help from insurance (it may be cheaper with your insurance), but the website can give you the price if you did not use any insurance.  - You can print the associated coupon and take it with your prescription to the pharmacy.  - You may also stop by our office during regular business hours  and pick up a GoodRx coupon card.  - If you need your prescription sent electronically to a different pharmacy, notify our office through [facility] or by phone at [phone]

## 2024-03-20 NOTE — Progress Notes (Signed)
 New Patient Visit   Subjective  Beverly [name] is a 43 y.o. female NEW PATIENT who presents for the following: non healing abdominal wound ulcer - referred by Infectious Disease.   Patient states she has had a skin lesion on her lower abdomen now x 3 years. Has NEVER UNDERGONE A SKIN BIOPSY but she states that they have taken multiple photos over the years.   She underwent an INCISIONAL BIOPSY OF ABDOMINAL WOUND by Beverly [name] (general Beverly) on 02/14/24. Pathology was consistent with INVASIVE SQUAMOUS CELL CARCINOMA, well differentiated. She is no longer followed by general Beverly but is now being seen at the [facility]. She is currently on 2 antibiotics (doxy and amoxicillin ) as Bactrim  was interfering with her viral load. She has known HIV and is followed by Infectious Disease. Has a f/u appointment with wound center tomorrow 03/22/23.    PATHOLOGY SURGICAL PATHOLOGY CASE: [id number] PATIENT: Beverly [name] Surgical Pathology Report     Clinical History: Abdominal wound (las)     FINAL MICROSCOPIC DIAGNOSIS:  A. CHRONIC ABDOMINAL WALL WOUND, DEBRIDEMENT: - Invasive squamous cell carcinoma, well-differentiated, depth of invasion 0.2 cm (see comment).  Comment: This case was seen in peer review on 02/16/24 by Beverly [name], who concurs with the interpretation. This result was reported to Beverly [name] on 02/17/2024.      Not being seen today for this but it may be of significant clinical importance. Patient was seen by Gyn Onc in late 2019. 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 Subsequently had a cone biopsy w Gyn Onc on 04/14/2018 with the following pathology:    REPORT OF SURGICAL PATHOLOGY FINAL DIAGNOSIS Diagnosis 1. Cervix, cone, 12 o'clock - HIGH GRADE SQUAMOUS INTRAEPITHELIAL LESION (CIN-II-III), WITH EXTENSIVE INVOLVEMENT OF ENDOCERVICAL GLANDS; SEE COMMENT. 2. Endocervix,  curettage - HIGH GRADE SQUAMOUS INTRAEPITHELIAL LESION (CIN-II-III), WITH INVOLVEMENT OF ENDOCERVICAL GLANDS. 3. Vagina, biopsy, 9 o'clock - LOW GRADE SQUAMOUS INTRAEPITHELIAL LESION (VAIN-I). 4. 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 - NO DYSPLASIA IDENTIFIED. 5. Vulva, biopsy, 4 o'clock - LOW GRADE SQUAMOUS INTRAEPITHELIAL LESION (VIN-I).  Microscopic Comment 1. The endocervical margin is involved by high grade squamous dysplasia along the 3-6, 6-9 and 9-12 o'clock positions. The ectocervical margin is not involved.  I am uncertain if she has had subsequent follow up for the above as it was not the reason she was referred to dermatology. Photos today of her vulvar area did not save but I do believe IT  IS of clinical importance.     The following portions of the chart were reviewed this encounter and updated as appropriate: medications, allergies, medical history  Review of Systems:  No other skin or systemic complaints except as noted in HPI or Assessment and Plan.  Objective  Well appearing patient in SIGNIFICANT PAIN. EXCELLENT historiain.   A focused examination was performed of the following areas: Abdomen and genital region  Relevant exam findings are noted in the Assessment and Plan.       Red beefy ulcerated plaque with iodoform guaze x 2. No signs of infection.   PHOTOS from Vulvar area did not transfer but hypopigmented patches with open linear fissure in left inguinal region.     Assessment & Plan   SQUAMOUS CELL CARCINOMA / NON-HEALING ULCER  - best plan of action for this patient is to have scouting biopsies (2-4  in number) done of the area to r/o remaining SCC but patient is in so much pain that she is not able to undergo these types of procedure in office with local anesthesia. Case was reviewed with Beverly [name] who agrees with plan but we will need to refer patient to plastic Beverly, Beverly [name], to arrange for further biopsies/testing whether it be scouting biopsies or further excisional biopsies under general anesthesia. Imaging of abdomen/pelvis may also be prudent. I/we are happy to assist in the care of this patient in the future, if need.   NON HEALING VULVAR WOUND  - likely needs further biopsy/workup with either plastics or GYN.    SQUAMOUS CELL CARCINOMA OF SKIN   Related Procedures Ambulatory referral to Plastic Beverly ULCER OF ABDOMEN WALL, UNSPECIFIED ULCER STAGE   Related Procedures Ambulatory referral to Plastic Beverly VULVAR LESION   I spent a total of 60 minutes reviewing the patient's chart, including:   10 minutes reviewing prior records, labs, imaging, and other pertinent information before the visit.   30 minutes discussing the patient's case and providing care during the visit.   20 minutes documenting and reviewing post-visit findings and care plan after the visit.   Related Procedures Ambulatory referral to Plastic Beverly  Return if symptoms worsen or fail to improve, for referral to plastic Beverly .  I, Beverly [name], CMA, am acting as scribe for Beverly Biss K, PA-C.   Documentation: I have reviewed the above documentation for accuracy and completeness, and I agree with the above.  Beverly Fults K, PA-C

## 2024-03-21 ENCOUNTER — Other Ambulatory Visit (HOSPITAL_COMMUNITY): Payer: Self-pay

## 2024-03-21 ENCOUNTER — Encounter (HOSPITAL_BASED_OUTPATIENT_CLINIC_OR_DEPARTMENT_OTHER): Admitting: Internal Medicine

## 2024-03-21 DIAGNOSIS — L98499 Non-pressure chronic ulcer of skin of other sites with unspecified severity: Secondary | ICD-10-CM | POA: Diagnosis not present

## 2024-03-21 NOTE — Addendum Note (Signed)
 Addended by: CAMMIE BELT D on: 03/21/2024 01:48 PM   Modules accepted: Orders

## 2024-03-21 NOTE — Addendum Note (Signed)
 Addended by: CAMMIE BELT D on: 03/21/2024 11:31 AM   Modules accepted: Orders

## 2024-03-22 ENCOUNTER — Other Ambulatory Visit: Payer: Self-pay

## 2024-03-22 ENCOUNTER — Ambulatory Visit

## 2024-03-23 LAB — CBC WITH DIFFERENTIAL/PLATELET
Absolute Lymphocytes: 1050 {cells}/uL (ref 850–3900)
Absolute Monocytes: 283 {cells}/uL (ref 200–950)
Basophils Absolute: 21 {cells}/uL (ref 0–200)
Basophils Relative: 0.5 %
Eosinophils Absolute: 312 {cells}/uL (ref 15–500)
Eosinophils Relative: 7.6 %
HCT: 26.2 % — ABNORMAL LOW (ref 35.0–45.0)
Hemoglobin: 7.9 g/dL — ABNORMAL LOW (ref 11.7–15.5)
MCH: 25.6 pg — ABNORMAL LOW (ref 27.0–33.0)
MCHC: 30.2 g/dL — ABNORMAL LOW (ref 32.0–36.0)
MCV: 85.1 fL (ref 80.0–100.0)
MPV: 10.9 fL (ref 7.5–12.5)
Monocytes Relative: 6.9 %
Neutro Abs: 2435 {cells}/uL (ref 1500–7800)
Neutrophils Relative %: 59.4 %
Platelets: 375 Thousand/uL (ref 140–400)
RBC: 3.08 Million/uL — ABNORMAL LOW (ref 3.80–5.10)
RDW: 17.3 % — ABNORMAL HIGH (ref 11.0–15.0)
Total Lymphocyte: 25.6 %
WBC: 4.1 Thousand/uL (ref 3.8–10.8)

## 2024-03-23 LAB — HIV-1 RNA QUANT-NO REFLEX-BLD
HIV 1 RNA Quant: 8090 {copies}/mL — ABNORMAL HIGH
HIV-1 RNA Quant, Log: 3.91 {Log_copies}/mL — ABNORMAL HIGH

## 2024-03-27 ENCOUNTER — Ambulatory Visit

## 2024-03-27 VITALS — BP 111/76 | HR 111 | Ht 61.0 in | Wt 114.2 lb

## 2024-03-27 DIAGNOSIS — S31109D Unspecified open wound of abdominal wall, unspecified quadrant without penetration into peritoneal cavity, subsequent encounter: Secondary | ICD-10-CM | POA: Diagnosis not present

## 2024-03-27 DIAGNOSIS — F17210 Nicotine dependence, cigarettes, uncomplicated: Secondary | ICD-10-CM

## 2024-03-27 DIAGNOSIS — B20 Human immunodeficiency virus [HIV] disease: Secondary | ICD-10-CM | POA: Insufficient documentation

## 2024-03-27 DIAGNOSIS — Z85828 Personal history of other malignant neoplasm of skin: Secondary | ICD-10-CM

## 2024-03-27 DIAGNOSIS — D72819 Decreased white blood cell count, unspecified: Secondary | ICD-10-CM

## 2024-03-27 DIAGNOSIS — C4492 Squamous cell carcinoma of skin, unspecified: Secondary | ICD-10-CM | POA: Insufficient documentation

## 2024-03-27 NOTE — Progress Notes (Addendum)
 New Patient Office Visit  Subjective    Patient ID: Beverly [name], female    DOB: 05-30-1981  Age: 43 y.o. 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 The patient first noticed it 3 years ago. It has been growing since. Patient was admitted to the hospital this year due to an episode of bleeding from the abdominal wall tumor. Underwent an incisional biopsy in the operating room with Dr. Curvin (General Surgery) that confirmed the diagnosis of SCC. Patient states she missed her follow up appointment with Dr. Curvin but has one scheduled on 10/16. She has known HIV and is being followed by Infectious Disease. Has had issues taking her antivirals and is currently leukopenic. Follows up with wound care center who's performing wound care. 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 She states that she has not received treatment for it and has lost follow up. Patient saw dermatology on 09/29 for Mohs surgery and was referred to me for further treatment/management. Case manager of Myle[facility] Moncure is here with her and will help coordinate care.   Outpatient Encounter Medications as of 03/27/2024  Medication Sig   acetaminophen  (TYLENOL ) 325 MG tablet Take 2 tablets (650 mg total) by mouth every 6 (six) hours as needed for mild pain, fever or headache. (Patient taking differently: Take 650 mg by mouth every 6 (six) hours as needed (for fevers).)   albuterol  (VENTOLIN  HFA) 108 (90 Base) MCG/ACT inhaler Inhale 2 puffs into the lungs every 6 (six) hours as needed for wheezing or shortness of breath.   atovaquone  (MEPRON ) 750 MG/5ML suspension Take 10 mLs (1,500 mg total) by mouth daily.   ATROVENT  HFA 17 MCG/ACT inhaler Inhale 2 puffs into the lungs every  6 (six) hours as needed for wheezing.   bictegravir-emtricitabine -tenofovir  AF (BIKTARVY ) 50-200-25 MG TABS tablet Take 1 tablet by mouth daily.   bictegravir-emtricitabine -tenofovir  AF (BIKTARVY ) 50-200-25 MG TABS tablet Take by mouth daily.   bictegravir-emtricitabine -tenofovir  AF (BIKTARVY ) 50-200-25 MG TABS tablet Take 1 tablet by mouth daily for 28 days.   ibuprofen  (ADVIL ) 600 MG tablet Take 1 tablet (600 mg total) by mouth every 6 (six) hours as needed for mild pain (pain score 1-3) or moderate pain (pain score 4-6).   magnesium  oxide (MAG-OX) 400 (240 Mg) MG tablet Take 400 mg by mouth 2 (two) times daily.   ondansetron  (ZOFRAN ) 4 MG tablet Take 1 tablet (4 mg total) by mouth every 6 (six) hours as needed for nausea.   pantoprazole  (PROTONIX ) 40 MG tablet Take 1 tablet (40 mg total) by mouth daily.   pregabalin  (LYRICA ) 25 MG capsule Take 1 capsule (25 mg total) by mouth 2 (two) times daily.   amoxicillin -clavulanate (AUGMENTIN ) 875-125 MG tablet Take 1 tablet by mouth 2 (two) times daily for 14 days. (Patient not taking: Reported on 03/27/2024)   doxycycline  (VIBRA -TABS) 100 MG tablet Take 1 tablet (100 mg total) by mouth 2 (two) times daily with a meal for 14 days. (Patient not taking: Reported on 03/27/2024)   oxyCODONE  (OXY IR/ROXICODONE ) 5 MG immediate release tablet Take 1 tablet (5 mg total) by mouth every 6 (six) hours as needed for moderate pain (pain score 4-6)  or severe pain (pain score 7-10). (Patient not taking: Reported on 03/27/2024)   No facility-administered encounter medications on file as of 03/27/2024.    Past Medical History:  Diagnosis Date   Abscess    AIDS (HCC) 03/19/2015   Anxiety    Asthma    Blood transfusion without reported diagnosis    Depression    HIV infection (HCC)    Homeless 03/19/2015   states stays with family or friends. Does not rent or own a home, but does not live on streets.   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/ hands   Skin ulcer (HCC) 05/06/2015    Past Surgical History:  Procedure Laterality Date   BIOPSY OF SKIN SUBCUTANEOUS TISSUE AND/OR MUCOUS MEMBRANE N/A 02/14/2024   Procedure: Incisional Biopsy of Abdominal Wall Wound;  Surgeon: Curvin Deward MOULD, MD;  Location: [facility];  Service: General;  Laterality: N/A;   CERVICAL CONIZATION W/BX N/A 04/14/2018   Procedure: COLD KNIFE CONIZATION CERVIX WITH BIOPSY;  Surgeon: Anitra Freddy NOVAK, MD;  Location: [facility] [place];  Service: Gynecology;  Laterality: N/A;   CESAREAN SECTION     DILATION AND CURETTAGE OF UTERUS N/A 04/14/2018   Procedure: ENDOCERVICAL CURETTAGE;  Surgeon: Anitra Freddy NOVAK, MD;  Location: [facility];  Service: Gynecology;  Laterality: N/A;   INCISION AND DRAINAGE ABSCESS Right 01/10/2016   Procedure: INCISION AND DRAINAGE RIGHT BUTTOCK, LEFT LABIAL , EXCISION AND DRAINAGE OF  RIGHT AXILLARY ABSCESS;  Surgeon: Morene Olives, MD;  Location: [facility];  Service: General;  Laterality: Right;   TUBAL LIGATION  2005   VULVA MARYBETH BIOPSY N/A 04/14/2018   Procedure: VULVAR BIOPSIES;  Surgeon: Anitra Freddy NOVAK, MD;  Location: [facility];  Service: Gynecology;  Laterality: N/A;    Family History  Problem Relation Age of Onset   Throat cancer Mother        smoker   Throat cancer Father        smoker   Hypertension Father    Cirrhosis Father    Hypertension Other    Cancer Other        smoker   Diabetes Other    Asthma Other     Social History   Socioeconomic History   Marital status: Single    Spouse name: Not on file   Number of children: 4   Years of education: Not on file   Highest education level: Not on file  Occupational History   Occupation: Production designer, theatre/television/film at [employer]  Tobacco Use   Smoking status: Every Day    Current packs/day: 0.10    Average packs/day: 0.1 packs/day for 3.0 years (0.3 ttl  pk-yrs)    Types: Cigarettes   Smokeless tobacco: Never   Tobacco comments:    1-2 a day  Vaping Use   Vaping status: Never Used  Substance and Sexual Activity   Alcohol use: Yes    Alcohol/week: 0.0 standard drinks of alcohol    Comment: Ocassionally   Drug use: Yes    Frequency: 2.0 times per week    Types: Marijuana, MDMA (Ecstacy)   Sexual activity: Not Currently    Partners: Male    Birth control/protection: Condom, Surgical    Comment: pt. given condoms  Other Topics Concern   Not on file  Social History Narrative   Not on file   Social  Drivers of Corporate investment banker Strain: Not on file  Food Insecurity: Food Insecurity Present (02/25/2024)   Hunger Vital Sign    Worried About Running Out of Food in the Last Year: Sometimes true    Ran Out of Food in the Last Year: Sometimes true  Transportation Needs: No Transportation Needs (02/25/2024)   PRAPARE - Administrator, Civil Service (Medical): No    Lack of Transportation (Non-Medical): No  Recent Concern: Transportation Needs - Unmet Transportation Needs (02/12/2024)   PRAPARE - Administrator, Civil Service (Medical): Yes    Lack of Transportation (Non-Medical): Yes  Physical Activity: Not on file  Stress: Not on file  Social Connections: Not on file  Intimate Partner Violence: Not At Risk (02/25/2024)   Humiliation, Afraid, Rape, and Kick questionnaire    Fear of Current or Ex-Partner: No    Emotionally Abused: No    Physically Abused: No    Sexually Abused: No    ROS ROS negative except as noted in HPI     Objective    BP 111/76 (BP Location: Left Arm, Patient Position: Sitting, Cuff Size: Normal)   Pulse (!) 111   Ht 5' 1 (1.549 m)   Wt 114 lb 3.2 oz (51.8 kg)   SpO2 99%   BMI 21.58 kg/m   Physical Exam MA as chaperone Patient has a chronic beefy wound just above the symphysis pubis. This is in the pannus crease. Has some horizontal length. No discharge or signs of  infection.  Last CBC Lab Results  Component Value Date   WBC 4.1 03/16/2024   HGB 7.9 (L) 03/16/2024   HCT 26.2 (L) 03/16/2024   MCV 85.1 03/16/2024   MCH 25.6 (L) 03/16/2024   RDW 17.3 (H) 03/16/2024   PLT 375 03/16/2024   Last metabolic panel Lab Results  Component Value Date   GLUCOSE 85 03/01/2024   NA 140 03/01/2024   K 3.9 03/01/2024   CL 108 03/01/2024   CO2 23 03/01/2024   BUN 15 03/01/2024   CREATININE 0.75 03/01/2024   GFRNONAA >60 03/01/2024   CALCIUM  8.6 (L) 03/01/2024   PHOS 2.9 02/14/2024   PROT 8.3 (H) 03/01/2024   ALBUMIN 3.2 (L) 03/01/2024   BILITOT 0.7 03/01/2024   ALKPHOS 145 (H) 03/01/2024   AST 43 (H) 03/01/2024   ALT 16 03/01/2024   ANIONGAP 9 03/01/2024    CT SCAN ABD/PEL IMPRESSION: 1. Low anterior abdominal wall wound again identified. Tiny curvilinear hyperattenuating focus identified along the right margin of the wound. Small focus of active arterial phase hemorrhage at this location is not excluded. This persists on the venous imaging with slightly different configuration but no accumulation of high density to confirm ongoing bleeding. No discernible feeding vessel. No pre contrast imaging to exclude that this represents a radiopaque foreign body in the wound. 2. Otherwise, no acute findings in the abdomen or pelvis. 3. Tiny nonobstructing renal stones bilaterally. 4. Urethral diverticulum.     Electronically Signed   By: Camellia Candle M.D.   On: 02/12/2024 09:03   CT SPINE IMPRESSION: 1. No acute/traumatic lumbar spine pathology. 2. Nonobstructing right renal calculi.  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   Discussed case with patient and explained to her the diagnosis of her abdominal wall wound as well as her vulvar pathology. I explained to her that her case will require a multidisciplinary approach. I discussed the case with Dr. Curvin. Patient will see Dr. Curvin on 10/08 for follow up. If Dr. Curvin considers this cancer is amendable to oncologic resection, I explained to the patient that I will be available to reconstruct the oncologic defect in stages. We also made a referral to GYN/Oncology today to follow up on the vulvar cancer. I think is important to make sure this two tumors are not interconnected, and to make sure both are treated accordingly. The appointment with GYN/Oncology has been made for Oct. 28 during this visit encounter as well. I will order a CT chest as part of her cancer staging since the last one was in 2022 and patient has had the [facility] for 3 years. Reinforced the importance of smoking cessation as well as taking her HIV medications on a daily basis. Follow up with ID as scheduled. Patient will follow up with me in 1 month.        Return in about 4 weeks (around 04/24/2024).   Juleen Sorrels M Marilea Gwynne, MD

## 2024-03-28 ENCOUNTER — Encounter (HOSPITAL_BASED_OUTPATIENT_CLINIC_OR_DEPARTMENT_OTHER): Attending: General Surgery | Admitting: General Surgery

## 2024-03-28 DIAGNOSIS — L98499 Non-pressure chronic ulcer of skin of other sites with unspecified severity: Secondary | ICD-10-CM | POA: Diagnosis present

## 2024-03-28 DIAGNOSIS — B20 Human immunodeficiency virus [HIV] disease: Secondary | ICD-10-CM | POA: Insufficient documentation

## 2024-03-28 DIAGNOSIS — E43 Unspecified severe protein-calorie malnutrition: Secondary | ICD-10-CM | POA: Diagnosis not present

## 2024-03-28 DIAGNOSIS — C469 Kaposi's sarcoma, unspecified: Secondary | ICD-10-CM | POA: Insufficient documentation

## 2024-04-06 ENCOUNTER — Ambulatory Visit (INDEPENDENT_AMBULATORY_CARE_PROVIDER_SITE_OTHER): Payer: Self-pay | Admitting: Infectious Diseases

## 2024-04-06 ENCOUNTER — Ambulatory Visit: Payer: Self-pay | Admitting: General Surgery

## 2024-04-06 ENCOUNTER — Other Ambulatory Visit: Payer: Self-pay

## 2024-04-06 ENCOUNTER — Encounter: Payer: Self-pay | Admitting: Infectious Diseases

## 2024-04-06 VITALS — BP 114/79 | HR 92 | Ht 61.0 in | Wt 115.8 lb

## 2024-04-06 DIAGNOSIS — B20 Human immunodeficiency virus [HIV] disease: Secondary | ICD-10-CM | POA: Diagnosis present

## 2024-04-06 DIAGNOSIS — F411 Generalized anxiety disorder: Secondary | ICD-10-CM

## 2024-04-06 DIAGNOSIS — C4490 Unspecified malignant neoplasm of skin, unspecified: Secondary | ICD-10-CM | POA: Diagnosis not present

## 2024-04-06 DIAGNOSIS — Z79899 Other long term (current) drug therapy: Secondary | ICD-10-CM | POA: Diagnosis not present

## 2024-04-06 DIAGNOSIS — D638 Anemia in other chronic diseases classified elsewhere: Secondary | ICD-10-CM

## 2024-04-06 NOTE — Patient Instructions (Addendum)
 You are doing phenomenal with making all these appointments. I am very pleased with how the wound clinic has helped care for you wound.  I greatly appreciate all the care the other surgeons have offered you as well. Agree with the plan!  Please try to get in your biktarvy  everyday. This is the only way your immune system will have a chance to recover and work better for you.   Continue the dapsone.

## 2024-04-06 NOTE — Progress Notes (Signed)
 Name: Beverly [name]  DOB: 02-13-1981 MRN: [id number] PCP: Melvenia Corean SAILOR, NP     Brief Narrative:  Beverly [name] is a 43 y.o. 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 Dx ~2005.  CD4 nadir < 35 HIV Risk: sexual  History of OIs:  Intake Labs : Hep B sAg (- 2010), sAb (- 2010), cAb (- 2010); Hep A (), Hep C (- 2010) Quantiferon () HLA B*5701 (-) G6PD: ()   Previous Regimens: Atripla Kaletra + Combivir  Darunavir  + Ritonavir  + Truvada   Tivicay  + Descovy  ~2018 Biktarvy     Genotypes: Previously wildtype  12-2019: pending  Subjective:   No chief complaint on file.      Discussed the use of AI scribe software for clinical note transcription with the patient, who gave verbal consent to proceed.  History of Present Illness   Beverly [name] is a 43 year old female with HIV who presents for routine follow-up care.  She has been managing her HIV with Biktarvy , taken in the morning, but her viral load was 8,000 three weeks ago, which is higher than desired. No fevers or chills since completing her antibiotic course over a week ago.  She has a history of low white blood cell count, which improved to 4.1 after discontinuing Bactrim . Despite this, she remains anemic with a hemoglobin level of 7.9.  She experiences anxiety, particularly at work, affecting her concentration and ability to function. She finds managing responsibilities overwhelming and is considering applying for disability due to the impact on her work performance. She is working with Har[facility] to navigate this process.  She denies experiencing any fevers or chills since completing her antibiotic course over a week ago. She received her flu shot three weeks ago.       Review of Systems  Constitutional:  Positive for malaise/fatigue and weight loss. Negative for chills and fever.  Eyes:  Negative for blurred vision, double vision and photophobia.  Respiratory:  Negative for cough and sputum production.    Gastrointestinal:  Positive for nausea. Negative for abdominal pain and vomiting.  Skin:        Open wound with 2 tunneled areas  Neurological:  Positive for tingling (neuropathy pain). Negative for headaches.  Psychiatric/Behavioral:  Positive for depression. The patient is nervous/anxious.      Past Medical History:  Diagnosis Date   Abscess    AIDS (HCC) 03/19/2015   Anxiety    Asthma    Blood transfusion without reported diagnosis    Depression    HIV infection (HCC)    Homeless 03/19/2015   states stays with family or friends. Does not rent or own a home, but does not live on streets.   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 (Patient taking differently: Take 650 mg by mouth every 6 (six) hours as needed (for fevers).)     albuterol  (VENTOLIN  HFA) 108 (90 Base) MCG/ACT inhaler Inhale 2 puffs into the lungs every 6 (six) hours as needed for wheezing or shortness of breath.     atovaquone  (MEPRON ) 750 MG/5ML suspension Take 10 mLs (1,500 mg total)  by mouth daily. 210 mL 6   ATROVENT  HFA 17 MCG/ACT inhaler Inhale 2 puffs into the lungs every 6 (six) hours as needed for wheezing.     bictegravir-emtricitabine -tenofovir  AF (BIKTARVY ) 50-200-25 MG TABS tablet Take 1 tablet by mouth daily. 30 tablet 3   bictegravir-emtricitabine -tenofovir  AF (BIKTARVY ) 50-200-25 MG TABS tablet Take by mouth daily. 7 tablet 0   ibuprofen  (ADVIL ) 600 MG tablet Take 1 tablet (600 mg total) by mouth every 6 (six) hours as needed for mild pain (pain score 1-3) or moderate pain (pain score 4-6). 20 tablet 0   magnesium  oxide (MAG-OX) 400 (240 Mg) MG  tablet Take 400 mg by mouth 2 (two) times daily.     ondansetron  (ZOFRAN ) 4 MG tablet Take 1 tablet (4 mg total) by mouth every 6 (six) hours as needed for nausea. 20 tablet 0   oxyCODONE  (OXY IR/ROXICODONE ) 5 MG immediate release tablet Take 1 tablet (5 mg total) by mouth every 6 (six) hours as needed for moderate pain (pain score 4-6) or severe pain (pain score 7-10). 15 tablet 0   pantoprazole  (PROTONIX ) 40 MG tablet Take 1 tablet (40 mg total) by mouth daily. 30 tablet 0   pregabalin  (LYRICA ) 25 MG capsule Take 1 capsule (25 mg total) by mouth 2 (two) times daily. 60 capsule 1   No facility-administered medications prior to visit.     Allergies  Allergen Reactions   Bee Venom Anaphylaxis, Shortness Of Breath and Swelling   Ferrlecit  [Na Ferric Gluc Cplx In Sucrose] Swelling and Other (See Comments)    Swelling and puffiness in hands and legs after Ferrlecit  250 mg infused over 60 minutes (possibly infusion related reaction)   Latex Hives   Morphine  Hives, Itching and Other (See Comments)    And- Tolerates hydrocodone , oxycodone , and codeine with no issues    Social History   Tobacco Use   Smoking status: Every Day    Current packs/day: 0.10    Average packs/day: 0.1 packs/day for 3.0 years (0.3 ttl pk-yrs)    Types: Cigarettes   Smokeless tobacco: Never   Tobacco comments:    1-2 a day  Vaping Use   Vaping status: Never Used  Substance Use Topics   Alcohol use: Yes    Alcohol/week: 0.0 standard drinks of alcohol    Comment: Ocassionally   Drug use: Yes    Frequency: 2.0 times per week    Types: Marijuana, MDMA (Ecstacy)    Family History  Problem Relation Age of Onset   Throat cancer Mother        smoker   Throat cancer Father        smoker   Hypertension Father    Cirrhosis Father    Hypertension Other    Cancer Other        smoker   Diabetes Other    Asthma Other     Social History   Substance and Sexual Activity  Sexual Activity Not Currently    Partners: Male   Birth control/protection: Condom, Surgical   Comment: pt. given condoms     Objective:   Vitals:   04/06/24 1411  BP: 114/79  Pulse: 92  SpO2: 100%  Weight: 115 lb 12.8 oz (52.5 kg)  Height: 5' 1 (1.549 m)     Body mass index is 21.88 kg/m.   Physical Exam Vitals reviewed.  Constitutional:      Appearance: Normal appearance. She is not ill-appearing.  HENT:     Mouth/Throat:  Mouth: Mucous membranes are moist.     Pharynx: Oropharynx is clear.  Eyes:     General: No scleral icterus. Cardiovascular:     Rate and Rhythm: Normal rate.  Pulmonary:     Effort: Pulmonary effort is normal.  Skin:    General: Skin is warm and dry.     Comments: Wound assessed from photos in chart from 48 hours ago at wound care clinic visit.   Neurological:     Mental Status: She is oriented to person, place, and time.  Psychiatric:        Mood and Affect: Mood normal.        Thought Content: Thought content normal.     Lab Results Lab Results  Component Value Date   WBC 4.1 03/16/2024   HGB 7.9 (L) 03/16/2024   HCT 26.2 (L) 03/16/2024   MCV 85.1 03/16/2024   PLT 375 03/16/2024    Lab Results  Component Value Date   CREATININE 0.75 03/01/2024   BUN 15 03/01/2024   NA 140 03/01/2024   K 3.9 03/01/2024   CL 108 03/01/2024   CO2 23 03/01/2024    Lab Results  Component Value Date   ALT 16 03/01/2024   AST 43 (H) 03/01/2024   ALKPHOS 145 (H) 03/01/2024   BILITOT 0.7 03/01/2024    Lab Results  Component Value Date   CHOL 104 02/22/2024   HDL 45 (L) 02/22/2024   LDLCALC 45 02/22/2024   TRIG 65 02/22/2024   CHOLHDL 2.3 02/22/2024   HIV 1 RNA Quant (copies/mL)  Date Value  03/16/2024 8,090 (H)  02/22/2024 9,150 (H)  02/13/2024 213,000   CD4 T Cell Abs (/uL)  Date Value  02/22/2024 <35 (L)  11/22/2023 <35 (L)  10/19/2022 <35 (L)     Assessment & Plan:   Patient Active Problem List   Diagnosis Date Noted   Symptomatic HIV infection  (HCC) 03/27/2024   SCC (squamous cell carcinoma) 03/27/2024   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/03/2024   Sepsis due to cellulitis (HCC) 02/12/2024   Bleeding from wound 02/12/2024   Housing insecurity 01/01/2023   Transportation insecurity 01/01/2023   Cellulitis 12/21/2022   Hypocalcemia 12/21/2022   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   Nephrolithiasis 12/21/2022   Hypomagnesemia 01/23/2021   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   Nonintractable headache    Generalized lymphadenopathy    Skin rash associated with AIDS (HCC) 08/27/2020   Cellulitis of abdominal wall    HIV disease (HCC)    Weight loss    Nausea & vomiting 05/25/2018   Abdominal wall skin ulcer 03/10/2018   Itching 03/10/2018   History of 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/11/2016   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   Non-healing skin lesion 05/06/2015   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 Recheck today; discussed importance of adherence.  - Recheck viral load to assess response to Biktarvy . - Emphasize adherence to Biktarvy  regimen.  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 White blood cell count improved to 4.1 after discontinuation of Bactrim . Iron panel normal recently so no need to supplement and add to pill burden.  - continue HIV treatment - hopeful this will improve with time    Squamous cell carcinoma -  Skin cancer managed by oncology and derm - referred to gen surgery and plastics with a plan for surgical intervention in OR setting. Wound is healing well and remains clean - greatly appreciate the care the wound care team has been able to provide her. No concern for secondary infection since this intervention and off abx for > 1 week now.  - Coordinate with specialty teams for surgical scheduling/coordination.   Generalized anxiety disorder - Anxiety exacerbated by work-related stress and the burden of managing multiple health issues, impacting concentration and function at work, contributing to consideration for disability. - Continue to support and coordinate with her team for disability application. - Encourage reaching out to her support team when anxiety becomes overwhelming.      No orders of the defined types were placed in this encounter.  Orders Placed This Encounter  Procedures   HIV 1 RNA quant-no reflex-bld   Return in about 3 months (around 07/07/2024).    Corean Fireman, MSN, NP-C [facility]  [email or web address] Pager: [phone] Office: [phone] [facility] Main Line: [phone] *Secure Chat Communication Welcome

## 2024-04-08 LAB — HIV-1 RNA QUANT-NO REFLEX-BLD
HIV 1 RNA Quant: 2100 {copies}/mL — ABNORMAL HIGH
HIV-1 RNA Quant, Log: 3.32 {Log_copies}/mL — ABNORMAL HIGH

## 2024-04-10 ENCOUNTER — Ambulatory Visit: Payer: Self-pay | Admitting: Infectious Diseases

## 2024-04-10 NOTE — Progress Notes (Signed)
 Patient made aware of recent labs results and advised per Rachel Fireman, NP to separate vitamin and Bitarvy 6 hours apart. Patient states she was previously taking the medication together. Patient verbalized understanding to take separate from biktarvy .  Call transferred to front desk for scheduling follow up with pharmacy team

## 2024-04-10 NOTE — Progress Notes (Signed)
 Sounds good Beverly [name]! Her fill history is very spotty (02/16/24, 12/28/22, 03/04/22) though I know that information is not always updated or accurate. I reviewed her available genotypes from 2023 and earlier which were clear of any drug resistance.

## 2024-04-11 ENCOUNTER — Ambulatory Visit (HOSPITAL_BASED_OUTPATIENT_CLINIC_OR_DEPARTMENT_OTHER): Admitting: General Surgery

## 2024-04-18 ENCOUNTER — Ambulatory Visit: Payer: Self-pay | Admitting: Nurse Practitioner

## 2024-04-24 ENCOUNTER — Encounter (HOSPITAL_BASED_OUTPATIENT_CLINIC_OR_DEPARTMENT_OTHER): Admitting: General Surgery

## 2024-04-27 ENCOUNTER — Encounter (HOSPITAL_BASED_OUTPATIENT_CLINIC_OR_DEPARTMENT_OTHER): Attending: General Surgery | Admitting: General Surgery

## 2024-05-03 ENCOUNTER — Ambulatory Visit

## 2024-05-03 ENCOUNTER — Telehealth: Payer: Self-pay

## 2024-05-03 VITALS — BP 117/78 | HR 95

## 2024-05-03 DIAGNOSIS — C4492 Squamous cell carcinoma of skin, unspecified: Secondary | ICD-10-CM

## 2024-05-03 DIAGNOSIS — Z85828 Personal history of other malignant neoplasm of skin: Secondary | ICD-10-CM | POA: Diagnosis not present

## 2024-05-03 DIAGNOSIS — Z09 Encounter for follow-up examination after completed treatment for conditions other than malignant neoplasm: Secondary | ICD-10-CM

## 2024-05-03 DIAGNOSIS — Z720 Tobacco use: Secondary | ICD-10-CM | POA: Diagnosis not present

## 2024-05-03 NOTE — Telephone Encounter (Signed)
 Shaletha called stating pt was seen by plastics and was advised she would need to see ONC OBGYN. Updated provider who will review plastic note.  Lorenda CHRISTELLA Code, RMA

## 2024-05-03 NOTE — Progress Notes (Signed)
 Established Patient Office Visit  Subjective   Patient ID: Beverly [name], female    DOB: 08/04/1980  Age: 43 y.o. 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 The patient first noticed it 3 years ago. It has been growing since. Patient was admitted to the hospital this year due to an episode of bleeding from the abdominal wall tumor. Underwent an incisional biopsy in the operating room with Dr. Curvin (General Surgery) that confirmed the diagnosis of SCC. Patient states she missed her follow up appointment with Dr. Curvin but has one scheduled on 10/16. She has known HIV and is being followed by Infectious Disease. Has had issues taking her antivirals and is currently leukopenic. Follows up with wound care center who's performing wound care. 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 She states that she has not received treatment for it and has lost follow up. Patient saw dermatology on 09/29 for Mohs surgery and was referred to me for further treatment/management. Case manager of Beverly [name] is here with her and will help coordinate care.  After our visit patient followed up with Dr. Curvin who booked the excision of the squamous cell carcinoma of the abdomen for May 24, 2024.  Patient has been taking her antivirals more consistently and her viral load has decreased, her white blood cell count has increased.  She continues to have anemia.  Patient has not been able to follow-up with GYN, as last referral was canceled.  Per case manager they said that they were not GYN oncologists and the referral should come from PCP or plastic surgery.  Otherwise patient has been doing much better and is in good spirits today.    Objective:     BP 117/78 (BP Location: Left Arm, Patient Position: Sitting,  Cuff Size: Large)   Pulse 95   SpO2 100%  BP Readings from Last 3 Encounters:  05/03/24 117/78  04/06/24 114/79  03/27/24 111/76      Physical Exam RN as chaperone Alert oriented x 3 Breathing without difficulty Hemodynamically normal  Last CBC Lab Results  Component Value Date   WBC 4.1 03/16/2024   HGB 7.9 (L) 03/16/2024   HCT 26.2 (L) 03/16/2024   MCV 85.1 03/16/2024   MCH 25.6 (L) 03/16/2024   RDW 17.3 (H) 03/16/2024   PLT 375 03/16/2024   Last metabolic panel Lab Results  Component Value Date   GLUCOSE 85 03/01/2024   NA 140 03/01/2024   K 3.9 03/01/2024   CL 108 03/01/2024   CO2 23 03/01/2024   BUN 15 03/01/2024   CREATININE 0.75 03/01/2024   GFRNONAA >60 03/01/2024   CALCIUM  8.6 (L) 03/01/2024   PHOS 2.9 02/14/2024   PROT 8.3 (H) 03/01/2024   ALBUMIN 3.2 (L) 03/01/2024   BILITOT 0.7 03/01/2024   ALKPHOS 145 (H) 03/01/2024   AST 43 (H) 03/01/2024   ALT 16 03/01/2024   ANIONGAP 9 03/01/2024      The ASCVD Risk score (Arnett DK, et al., 2019) failed to calculate for the following reasons:   The valid total cholesterol range is 130 to 320 mg/dL    Assessment & Plan:   Problem List Items Addressed This Visit       Musculoskeletal and Integument   SCC (  squamous cell carcinoma) - Primary   Relevant Orders   Ambulatory referral to Gynecologic Oncology   -Patient will have surgery with Dr. Curvin in December.  I will contact him to see if he would require any assistance from a plastic surgery standpoint. - CT chest for staging has not been performed.  Recommended to schedule this. - New referral to GYN oncology placed.  Discussed the importance of coordinating this with the case manager and patient. - Patient counseled on stopping smoking. - All questions answered to patient and case manager satisfaction.   Beverly [name] M Jeramia Saleeby, MD

## 2024-05-05 ENCOUNTER — Telehealth: Payer: Self-pay | Admitting: *Deleted

## 2024-05-05 NOTE — Telephone Encounter (Signed)
 Spoke with the patient regarding the referral to GYN oncology. Patient scheduled as new patient with Dr Eldonna  12/1 at 11:15 am. Patient given an arrival time of 11 am.  Explained to the patient the the doctor will perform a pelvic exam at this visit. Patient given the policy that only one visitor allowed and that visitor must be over 16 yrs are allowed in the Cancer Center. Patient given the address/phone number for the clinic and that the center offers free valet service. Patient aware that masks optional.

## 2024-05-11 ENCOUNTER — Other Ambulatory Visit: Payer: Self-pay

## 2024-05-11 ENCOUNTER — Encounter (HOSPITAL_BASED_OUTPATIENT_CLINIC_OR_DEPARTMENT_OTHER): Admitting: General Surgery

## 2024-05-11 ENCOUNTER — Telehealth: Payer: Self-pay | Admitting: Pharmacist

## 2024-05-11 ENCOUNTER — Ambulatory Visit (INDEPENDENT_AMBULATORY_CARE_PROVIDER_SITE_OTHER): Admitting: Pharmacist

## 2024-05-11 DIAGNOSIS — Z113 Encounter for screening for infections with a predominantly sexual mode of transmission: Secondary | ICD-10-CM

## 2024-05-11 DIAGNOSIS — B20 Human immunodeficiency virus [HIV] disease: Secondary | ICD-10-CM

## 2024-05-11 DIAGNOSIS — Z23 Encounter for immunization: Secondary | ICD-10-CM

## 2024-05-11 NOTE — Progress Notes (Signed)
 NEW REFERRAL TO [facility]

## 2024-05-11 NOTE — Telephone Encounter (Signed)
 Called [facility] for refill history given inadequate dispense history. Per pharmacy, she has not filled Biktarvy  since 02/22/24. I would estimate she would have been out for nearly two months at this point unless she had a surplus of medication at home.   She also has not filled atovaquone  since 03/17/24.  Alan Geralds, PharmD, CPP, BCIDP, AAHIVP Clinical Pharmacist Practitioner Infectious Diseases Clinical Pharmacist [facility]

## 2024-05-11 NOTE — Progress Notes (Signed)
 HPI: Beverly [name] is a 43 y.o. female who presents to the [facility] pharmacy clinic for HIV follow-up.  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   Nonintractable headache    Generalized lymphadenopathy    Skin rash associated with AIDS (HCC) 08/27/2020   Cellulitis of abdominal wall    HIV disease (HCC)    Weight loss    Nausea & vomiting 05/25/2018   Abdominal wall skin ulcer 03/10/2018   Itching 03/10/2018   History of 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/11/2016   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   Non-healing skin lesion 05/06/2015   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   ALBUTEROL  (VENTOLIN  HFA) 108 (90 BASE) MCG/ACT INHALER    Inhale 2 puffs into the lungs every 6 (six) hours as needed for wheezing or shortness of breath.   ATOVAQUONE  (MEPRON ) 750 MG/5ML SUSPENSION    Take 10 mLs  (1,500 mg total) by mouth daily.   ATROVENT  HFA 17 MCG/ACT INHALER    Inhale 2 puffs into the lungs every 6 (six) hours as needed for wheezing.   BICTEGRAVIR-EMTRICITABINE -TENOFOVIR  AF (BIKTARVY ) 50-200-25 MG TABS TABLET    Take 1 tablet by mouth daily.   BICTEGRAVIR-EMTRICITABINE -TENOFOVIR  AF (BIKTARVY ) 50-200-25 MG TABS TABLET    Take by mouth daily.   IBUPROFEN  (ADVIL ) 600 MG TABLET    Take 1 tablet (600 mg total) by mouth every 6 (six) hours as needed for mild pain (pain score 1-3) or moderate pain (pain score 4-6).   MAGNESIUM  OXIDE (MAG-OX) 400 (240 MG) MG TABLET    Take 400 mg by mouth 2 (two) times daily.   ONDANSETRON  (ZOFRAN ) 4 MG TABLET    Take 1 tablet (4 mg total) by mouth every 6 (six) hours as needed for nausea.   OXYCODONE  (OXY IR/ROXICODONE ) 5 MG IMMEDIATE RELEASE TABLET    Take 1 tablet (5 mg total) by mouth every 6 (six) hours as needed for moderate pain (pain score 4-6) or severe pain (pain score 7-10).   PANTOPRAZOLE  (PROTONIX ) 40 MG TABLET    Take 1 tablet (40 mg total) by mouth daily.   PREGABALIN  (LYRICA ) 25 MG CAPSULE    Take 1 capsule (25 mg total) by mouth 2 (two) times daily.  Modified Medications   No medications on file  Discontinued Medications   No medications on file    Labs: Lab Results  Component Value Date  HIV1RNAQUANT 2,100 (H) 04/06/2024   HIV1RNAQUANT 8,090 (H) 03/16/2024   HIV1RNAQUANT 9,150 (H) 02/22/2024   CD4TABS <35 (L) 02/22/2024   CD4TABS <35 (L) 11/22/2023   CD4TABS <35 (L) 10/19/2022    RPR and STI Lab Results  Component Value Date   LABRPR NON REACTIVE 02/13/2024   LABRPR NON-REACTIVE 02/25/2022   LABRPR NON REACTIVE 01/23/2021   LABRPR Reactive (A) 08/27/2020   LABRPR NON-REACTIVE 12/26/2019    STI Results GC CT  02/22/2024 11:30 AM Negative    Negative  Negative    Negative   02/25/2022 10:13 AM Negative  Negative   08/27/2020 10:59 AM Negative  Negative   12/06/2019 11:05 PM Negative  Negative   04/05/2019 12:00 AM  Negative  Negative   03/10/2018 12:00 AM Negative  Negative   06/10/2016 12:00 AM Negative  Negative   03/05/2015 12:00 AM Negative  Negative     Hepatitis B Lab Results  Component Value Date   HEPBSAB NON-REACTIVE 02/22/2024   HEPBSAG NON-REACTIVE 10/19/2022   HEPBCAB NON REACTIVE 08/27/2020   Hepatitis C Lab Results  Component Value Date   HEPCAB NON-REACTIVE 02/22/2024   Hepatitis A Lab Results  Component Value Date   HAV REACTIVE (A) 02/22/2024   Lipids: Lab Results  Component Value Date   CHOL 104 02/22/2024   TRIG 65 02/22/2024   HDL 45 (L) 02/22/2024   CHOLHDL 2.3 02/22/2024   VLDL 11 06/10/2016   LDLCALC 45 02/22/2024    Current HIV Regimen: Biktarvy   Assessment: Beverly [name] presents today for HIV follow up. She last saw Corean on 04/06/2024 and appears that pharmacy was asked to see patient today to assess HIV viral load and adherence to Biktarvy  in the setting of AIDS. Recent HIV RNA levels have numerically trended down (8,090 in September 2025 >> 2,100 in October 2025) but is not suppressed.   Ines reports about two total missed doses since last office visit and reports tolerating Biktarvy  without any issues. Patient states she has no issues obtaining medications from the pharmacy and we discussed the importance of adherence and separating from antacids (e.g., calcium , magnesium , aluminum, iron) by at least 6 hours. She takes Biktarvy  in the mornings. She also reports taking atovaquone  daily and has missed about two doses total since last office visit. Patient reports not being sexually active and politely declines STI testing today.   Labs:  HIV RNA with reflex to genotype and Genosure Archive  Eligible vaccinations: Heplisav 1/2, Covid, Tdap, Shingrix 1/2, HPV 1/3, Menveo booster  - Patient accepts Tdap vaccine today   Plan: - Tdap vaccine administered in right deltoid  - Check HIV RNA with reflex to genotype and Genosure Archive today  - Follow up  with Corean on 07/05/2024  Feliciano Close, PharmD PGY2 Infectious Diseases Pharmacy Resident

## 2024-05-12 LAB — T-HELPER CELLS (CD4) COUNT (NOT AT ARMC)
CD4 % Helper T Cell: 1 % — ABNORMAL LOW (ref 33–65)
CD4 T Cell Abs: <35 /uL — ABNORMAL LOW (ref 400–1790)

## 2024-05-17 ENCOUNTER — Encounter: Payer: Self-pay | Admitting: Psychiatry

## 2024-05-22 ENCOUNTER — Inpatient Hospital Stay: Attending: Oncology | Admitting: Psychiatry

## 2024-05-22 ENCOUNTER — Encounter (HOSPITAL_COMMUNITY): Payer: Self-pay | Admitting: General Surgery

## 2024-05-22 ENCOUNTER — Inpatient Hospital Stay: Admitting: Gynecologic Oncology

## 2024-05-22 ENCOUNTER — Other Ambulatory Visit: Payer: Self-pay

## 2024-05-22 ENCOUNTER — Encounter: Payer: Self-pay | Admitting: Psychiatry

## 2024-05-22 VITALS — BP 113/80 | HR 115 | Temp 98.5°F | Resp 19 | Ht 61.0 in | Wt 116.8 lb

## 2024-05-22 VITALS — HR 110

## 2024-05-22 DIAGNOSIS — Z59819 Housing instability, housed unspecified: Secondary | ICD-10-CM | POA: Insufficient documentation

## 2024-05-22 DIAGNOSIS — L280 Lichen simplex chronicus: Secondary | ICD-10-CM | POA: Diagnosis not present

## 2024-05-22 DIAGNOSIS — Z79624 Long term (current) use of inhibitors of nucleotide synthesis: Secondary | ICD-10-CM | POA: Insufficient documentation

## 2024-05-22 DIAGNOSIS — B20 Human immunodeficiency virus [HIV] disease: Secondary | ICD-10-CM | POA: Insufficient documentation

## 2024-05-22 DIAGNOSIS — L309 Dermatitis, unspecified: Secondary | ICD-10-CM

## 2024-05-22 DIAGNOSIS — C4492 Squamous cell carcinoma of skin, unspecified: Secondary | ICD-10-CM

## 2024-05-22 DIAGNOSIS — F17210 Nicotine dependence, cigarettes, uncomplicated: Secondary | ICD-10-CM | POA: Insufficient documentation

## 2024-05-22 DIAGNOSIS — Z808 Family history of malignant neoplasm of other organs or systems: Secondary | ICD-10-CM | POA: Diagnosis not present

## 2024-05-22 DIAGNOSIS — Z1151 Encounter for screening for human papillomavirus (HPV): Secondary | ICD-10-CM | POA: Diagnosis not present

## 2024-05-22 DIAGNOSIS — N9089 Other specified noninflammatory disorders of vulva and perineum: Secondary | ICD-10-CM | POA: Diagnosis not present

## 2024-05-22 DIAGNOSIS — C44529 Squamous cell carcinoma of skin of other part of trunk: Secondary | ICD-10-CM | POA: Diagnosis not present

## 2024-05-22 DIAGNOSIS — Z79899 Other long term (current) drug therapy: Secondary | ICD-10-CM | POA: Insufficient documentation

## 2024-05-22 DIAGNOSIS — Z809 Family history of malignant neoplasm, unspecified: Secondary | ICD-10-CM | POA: Diagnosis not present

## 2024-05-22 DIAGNOSIS — D069 Carcinoma in situ of cervix, unspecified: Secondary | ICD-10-CM | POA: Diagnosis present

## 2024-05-22 MED ORDER — TRIAMCINOLONE ACETONIDE 0.1 % EX OINT: 1.0000 | TOPICAL_OINTMENT | Freq: Two times a day (BID) | CUTANEOUS | 0 refills | Status: AC

## 2024-05-22 NOTE — Progress Notes (Signed)
 Patient here for a consult with Dr. Eldonna for a pre-operative appointment prior to her scheduled surgery on 05/31/2024. She is scheduled for a pelvic exam under anesthesia, cervical and vulvar biopsies, sampling from inner canal of cervix. The surgery was discussed in detail.  See after visit summary for additional details.     Discussed post-op pain management in detail including the aspects of the enhanced recovery pathway. We discussed the use of tylenol  post-op and to monitor for a maximum of 4,000 mg in a 24 hour period. Discussed bowel regimen in detail.     Discussed the use of SCDs and measures to take at home to prevent DVT including frequent mobility.  Reportable signs and symptoms of DVT discussed. Post-operative instructions discussed and expectations for after surgery. Incisional care discussed as well including reportable signs and symptoms including erythema, drainage, wound separation.     30 minutes spent with the patient.  Verbalizing understanding of material discussed. No needs or concerns voiced at the end of the visit.   Advised patient to call for any needs.  Advised that her post-operative medications had been prescribed and could be picked up at any time.    This appointment is included in the global surgical bundle as pre-operative teaching and has no charge.

## 2024-05-22 NOTE — Patient Instructions (Signed)
 Preparing for your Surgery   Plan for surgery on May 31, 2024 with Dr. Olam Mill (Dr. Lyda partner) at [facility]. You will be scheduled for pelvic exam under anesthesia, cervical and vulvar biopsies, sampling from inner canal of cervix.  Dr. Eldonna would like for you to start applying steroid cream to the left labial thigh fold where the skin is thickened.   Pre-operative Testing -You will receive a phone call from presurgical testing at [facility] to discuss surgery instructions and arrange for lab work if needed.   -Bring your insurance card, copy of an advanced directive if applicable, medication list.   -You should not be taking blood thinners or aspirin at least ten days prior to surgery unless instructed by your surgeon.   -Do not take supplements such as fish oil (omega 3), red yeast rice, turmeric before your surgery. You want to avoid medications with aspirin in them including headache powders such as BC or Goody's), Excedrin migraine.   Day Before Surgery at Home -You will be advised you can have clear liquids up until 3 hours before your surgery.     Your role in recovery Your role is to become active as soon as directed by your doctor, while still giving yourself time to heal.  Rest when you feel tired. You will be asked to do the following in order to speed your recovery:   - Cough and breathe deeply. This helps to clear and expand your lungs and can prevent pneumonia after surgery.  - STAY ACTIVE WHEN YOU GET HOME. Do mild physical activity. Walking or moving your legs help your circulation and body functions return to normal. Do not try to get up or walk alone the first time after surgery.   -If you develop swelling on one leg or the other, pain in the back of your leg, redness/warmth in one of your legs, please call the office or go to the Emergency Room to have a doppler to rule out a blood clot. For shortness of breath, chest pain-seek  care in the Emergency Room as soon as possible. - Actively manage your pain. Managing your pain lets you move in comfort. We will ask you to rate your pain on a scale of zero to 10. It is your responsibility to tell your doctor or nurse where and how much you hurt so your pain can be treated.   Special Considerations -Your final pathology results from surgery should be available around one week after surgery and the results will be relayed to you when available.   -FMLA forms can be faxed to [phone] and please allow 5-7 business days for completion.   Pain Management After Surgery -Make sure that you have Tylenol  and Ibuprofen  at home IF YOU ARE ABLE TO TAKE THESE MEDICATIONS to use on a regular basis after surgery for pain control.     Bowel Regimen -It is important to prevent constipation and drink adequate amounts of liquids. You can use a laxative as needed for constipation.   Risks of Surgery Risks of surgery are low but include bleeding, infection, damage to surrounding structures, re-operation, blood clots, and very rarely death.   AFTER SURGERY INSTRUCTIONS   Return to work:  1-2 days if applicable   Activity: 1. Be up and out of the bed during the day.  Take a nap if needed.  You may walk up steps but be careful and use the hand rail.  Stair climbing will tire you  more than you think, you may need to stop part way and rest.    2. No lifting or straining for 2 weeks over 10 pounds. No pushing, pulling, straining for 2 weeks.   3. No driving for minimum 24 hours after surgery.  Do not drive if you are taking narcotic pain medicine and make sure that your reaction time has returned.    4. You can shower as soon as the next day after surgery. Shower daily. No tub baths or submerging your body in water until cleared by your surgeon (2 weeks minimum). If you have the soap that was given to you by pre-surgical testing that was used before surgery, you do not need to use it  afterwards because this can irritate your incisions.    5. No sexual activity and nothing in the vagina for 2 weeks.   6. You may experience vaginal spotting and discharge after surgery.  The spotting is normal but if you experience heavy bleeding, call our office.   7. Take Tylenol  and ibuprofen  for pain if you are able to take these medications for discomfort as needed.     Diet: 1. Low sodium Heart Healthy Diet is recommended but you are cleared to resume your normal (before surgery) diet after your procedure.   2. It is safe to use a laxative, such as Miralax or Colace, if you have difficulty moving your bowels.    Wound Care: 1. Keep clean and dry.  Shower daily.   Reasons to call the Doctor: Fever - Oral temperature greater than 100.4 degrees Fahrenheit Foul-smelling vaginal discharge Difficulty urinating Nausea and vomiting Increased pain at the site of the incision that is unrelieved with pain medicine. Difficulty breathing with or without chest pain New calf pain especially if only on one side Sudden, continuing increased vaginal bleeding with or without clots.   Contacts: For questions or concerns you should contact:   Dr. Hoy Masters and Dr. Olam Mill at [phone]   Eleanor Epps, NP at [phone]   After Hours: call [phone] and have the GYN Oncologist paged/contacted (after 5 pm or on the weekends).   Messages sent via mychart are for non-urgent matters and are not responded to after hours so for urgent needs, please call the after hours number.

## 2024-05-22 NOTE — Progress Notes (Signed)
 GYNECOLOGIC ONCOLOGY NEW PATIENT CONSULTATION  Date of Service: 05/22/2024 Referring Provider: Lisandro Montorfano, MD   ASSESSMENT AND PLAN: Beverly [name] is a 43 y.o. 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  Reviewed pt's history of cervical dysplasia and vulvar biopsies in the past. 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 Note on path is that individuals with HIV get unusual eczematous rashes.  Reviewed with patient that given it has been some time since her prior evaluation, and given her current abdominal wall SCC, her cervix and vulva warrants additional evaluation.  Pap smear collected today. Colposcopy performed but biopsies were not able to be obtained due to patient discomfort and anxiety surrounding exam. One are of acetowhite changes noted at about 10 o'clock which would be biopsied. Given no transformation zone visualized, would also recommend ECC.  In terms of her vulva, the linear white crusting and ulcerated lesion on the left vulva/thigh fold would benefit from biopsy. In the meantime, given that prior biopsies suggested lichen simplex chronicus and eczema, steroid ointment rx sent to initiate. Otherwise, would recommend additional biopsy of the anterior raised small lesions for further evaluation.  Recommend additional biopsies in OR for patient tolerance/comfort.  Patient was consented for: Exam under anesthesia, vulvar biopsies, cervical biopsy, endocervical curettage on 05/31/2024. Will set this up with my colleague, Dr. Leonce Ada.  The risks of surgery were discussed in detail and she understands these to including but not limited to bleeding requiring a blood transfusion, infection, injury to  adjacent organs (including but not limited to the vagina, rectum, urethra, nearby blood vessels/nerves), wound separation, unforseen complication, and possible need for re-exploration.  If the patient experiences any of these events, she understands that her hospitalization or recovery may be prolonged and that she may need to take additional medications for a prolonged period. The patient will receive DVT and antibiotic prophylaxis as indicated. She voiced a clear understanding. She had the opportunity to ask questions and informed consent was obtained today. She wishes to proceed.  She does not require preoperative clearance. Her METs are >4.  All preoperative instructions were reviewed. Postoperative expectations were also reviewed. Written handouts were provided to the patient.   A copy of this note was sent to the patient's referring provider.  Hoy Masters, MD Gynecologic Oncology   Medical Decision Making I personally spent  TOTAL 60 minutes face-to-face and non-face-to-face in the care of this patient, which includes all pre, intra, and post visit time on the date of service.   ------------  CC: history of cervical dysplasia, vulvar lesions  HISTORY OF PRESENT ILLNESS:  Beverly [name] is a 43 y.o. woman who is seen in consultation at the request of Melvenia Corean SAILOR, NP for evaluation of a history of cervical dysplasia and vulvar lesions in the setting of abdominal wall squamous cell carcinoma.  Patient was evaluated by general surgery for a chronic lower abdominal wall wound.  The patient is complicated by unstable housing and a history of HIV.  She went to the OR on 02/14/2024 for evaluation of her abdominal wall wound.  Tissue from the debridement were sent for pathology and returned with invasive squamous cell carcinoma.  She was subsequently seen by dermatology  on 03/20/2024 who also noted vulvar wounds on exam but photos did not save.  She was then referred to plastic  surgery.  At time of plastic surgery evaluation 03/27/24 they discussed a possible multidisciplinary approach to her abdominal wound alongside general surgery.  She was referred to gynecology in the setting of her vulvar lesions and history of cervical dysplasia.  Of note, patient was previously seen by gynecology on 04/06/2018 for an abnormal Pap smear.  She was taken to the operating room on 04/10/2018 and underwent a cervical conization, ECC, vaginal biopsy, vulvar biopsies.  In the vagina, an area of decreased uptake of Lugol's was noted on the right mid vaginal wall.  On the vulva on the left there was a linear indurated hypopigmented lesion with crusted plaque-like appearance.  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  ECC showed CIN-3.  Vaginal biopsy returned with VAIN 1.  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  Given her high-grade cervical dysplasia with positive margin on cone, she was recommended to repeat Pap smear in 6 months.  It does not appear that this was completed.  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  Pap at that time showed LSIL, HPV high-risk positive (negative for HPV 16/18/45).  Today reports that she has noticed break down in her left groin and on the vulva. She has noticed this since September with rubbing and getting worse. The area dried up and then opened up again. She notes there is occasional itching. Denies bleeding or spotting.   She is scheduled for surgery with Dr. Curvin on 05/24/24.  In terms of her HIV, she follows with ID at [facility] and was last seen on 04/06/24. Her viral load had increased and she was resumed on her anti viral medications. Pt reports that in the setting of her  unstable housing she had come off her meds and the reason while her viral load had increased. Back on her meds now.     PAST MEDICAL HISTORY: Past Medical History:  Diagnosis Date   Abscess    AIDS (HCC) 03/19/2015   Anemia    Anxiety    Arthritis    Asthma    Blood transfusion without reported diagnosis    Depression    HIV infection (HCC)    Homeless 03/19/2015   states stays with family or friends. Does not rent or own a home, but does not live on streets.   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   Pneumonia    Skin ulcer (HCC) 05/06/2015    PAST SURGICAL HISTORY: Past Surgical History:  Procedure Laterality Date   BIOPSY OF SKIN SUBCUTANEOUS TISSUE AND/OR MUCOUS MEMBRANE N/A 02/14/2024   Procedure: Incisional Biopsy of Abdominal Wall Wound;  Surgeon: Curvin Deward MOULD, MD;  Location: [facility];  Service: General;  Laterality: N/A;   CERVICAL CONIZATION W/BX N/A 04/14/2018   Procedure: COLD KNIFE CONIZATION CERVIX WITH BIOPSY;  Surgeon: Anitra Freddy NOVAK, MD;  Location: [facility];  Service: Gynecology;  Laterality: N/A;   CESAREAN SECTION     DILATION AND CURETTAGE OF UTERUS N/A 04/14/2018   Procedure: ENDOCERVICAL CURETTAGE;  Surgeon: Anitra Freddy NOVAK, MD;  Location: [facility];  Service: Gynecology;  Laterality: N/A;   INCISION AND DRAINAGE ABSCESS Right 01/10/2016   Procedure: INCISION AND DRAINAGE RIGHT BUTTOCK, LEFT LABIAL , EXCISION AND DRAINAGE OF  RIGHT AXILLARY ABSCESS;  Surgeon: Morene Olives, MD;  Location: [facility];  Service: General;  Laterality: Right;   TUBAL LIGATION  2005   VULVA MARYBETH BIOPSY N/A 04/14/2018   Procedure: VULVAR BIOPSIES;  Surgeon: Anitra Freddy NOVAK, MD;  Location: [facility];  Service: Gynecology;  Laterality: N/A;    OB/GYN HISTORY: OB History  Gravida Para Term Preterm AB  Living  4 4 4  0 0 4  SAB IAB Ectopic Multiple Live Births  0 0 0 0 4    # Outcome Date GA Lbr Len/2nd Weight Sex Type Anes PTL Lv  4 Term      CS-LTranv   LIV  3 Term      Vag-Spont   LIV  2 Term      Vag-Spont   LIV  1 Term      Vag-Spont   LIV      Age at menarche: 20 Age at menopause: n/a Hx of HRT: [place] in the past Hx of STI: hiv Last pap: 2022 LSIL, HPV HR positive (not 16/18/45) History of abnormal pap smears: yes, see above  SCREENING STUDIES:  Last mammogram: none Last colonoscopy: none  MEDICATIONS:  Current Outpatient Medications:    acetaminophen  (TYLENOL ) 325 MG tablet, Take 2 tablets (650 mg total) by mouth every 6 (six) hours as needed for mild pain, fever or headache., Disp: , Rfl:    albuterol  (VENTOLIN  HFA) 108 (90 Base) MCG/ACT inhaler, Inhale 2 puffs into the lungs every 6 (six) hours as needed for wheezing or shortness of breath., Disp: , Rfl:    atovaquone  (MEPRON ) 750 MG/5ML suspension, Take 10 mLs (1,500 mg total) by mouth daily., Disp: 210 mL, Rfl: 6   ATROVENT  HFA 17 MCG/ACT inhaler, Inhale 2 puffs into the lungs every 6 (six) hours as needed for wheezing., Disp: , Rfl:    bictegravir-emtricitabine -tenofovir  AF (BIKTARVY ) 50-200-25 MG TABS tablet, Take 1 tablet by mouth daily., Disp: 30 tablet, Rfl: 3   diphenhydrAMINE  HCl (BENADRYL  ALLERGY PO), Take 25 mg by mouth daily as needed (allergies)., Disp: , Rfl:    ondansetron  (ZOFRAN ) 4 MG tablet, Take 1 tablet (4 mg total) by mouth every 6 (six) hours as needed for nausea., Disp: 20 tablet, Rfl: 0   pantoprazole  (PROTONIX ) 40 MG tablet, Take 1 tablet (40 mg total) by mouth daily., Disp: 30 tablet, Rfl: 0   pregabalin  (LYRICA ) 25 MG capsule, Take 1 capsule (25 mg total) by mouth 2 (two) times daily., Disp: 60 capsule, Rfl: 1   triamcinolone  ointment (KENALOG ) 0.1 %, Apply 1 Application topically 2 (two) times daily., Disp: 30 g, Rfl: 0   Wound Dressings (HYDROFERA BLUE FOAM DRESSING) PADS, Apply 1 each  topically daily at 12 noon., Disp: , Rfl:   ALLERGIES: Allergies  Allergen Reactions   Bee Venom Anaphylaxis, Shortness Of Breath and Swelling   Morphine  Hives, Itching and Other (See Comments)    And- Tolerates hydrocodone , oxycodone , and codeine with no issues   Ferrlecit  [Na Ferric Gluc Cplx In Sucrose] Swelling and Other (See Comments)    Swelling and puffiness in hands and legs after Ferrlecit  250 mg infused over 60 minutes (possibly infusion related reaction)   Latex Hives    FAMILY HISTORY: Family History  Problem Relation Age of Onset  Throat cancer Mother        smoker   Throat cancer Father        smoker   Hypertension Father    Cirrhosis Father    Hypertension Other    Cancer Other        smoker   Diabetes Other    Asthma Other    Colon cancer Neg Hx    Breast cancer Neg Hx    Ovarian cancer Neg Hx    Pancreatic cancer Neg Hx    Prostate cancer Neg Hx     SOCIAL HISTORY: Social History   Socioeconomic History   Marital status: Single    Spouse name: Not on file   Number of children: 4   Years of education: Not on file   Highest education level: Not on file  Occupational History   Occupation: Production Designer, Theatre/television/film at [employer]  Tobacco Use   Smoking status: Every Day    Current packs/day: 0.25    Average packs/day: 0.3 packs/day for 10.9 years (2.7 ttl pk-yrs)    Types: Cigarettes    Start date: 2015   Smokeless tobacco: Never   Tobacco comments:    5-7 a day  Vaping Use   Vaping status: Never Used  Substance and Sexual Activity   Alcohol use: Yes    Alcohol/week: 0.0 standard drinks of alcohol    Comment: Ocassionally   Drug use: Yes    Frequency: 2.0 times per week    Types: Marijuana, MDMA (Ecstacy)   Sexual activity: Not Currently    Partners: Male    Birth control/protection: Condom, Surgical    Comment: pt. given condoms  Other Topics Concern   Not on file  Social History Narrative   Not on file   Social Drivers of Health   Financial Resource  Strain: Not on file  Food Insecurity: Food Insecurity Present (02/25/2024)   Hunger Vital Sign    Worried About Running Out of Food in the Last Year: Sometimes true    Ran Out of Food in the Last Year: Sometimes true  Transportation Needs: No Transportation Needs (02/25/2024)   PRAPARE - Administrator, Civil Service (Medical): No    Lack of Transportation (Non-Medical): No  Recent Concern: Transportation Needs - Unmet Transportation Needs (02/12/2024)   PRAPARE - Administrator, Civil Service (Medical): Yes    Lack of Transportation (Non-Medical): Yes  Physical Activity: Not on file  Stress: Not on file  Social Connections: Not on file  Intimate Partner Violence: Not At Risk (02/25/2024)   Humiliation, Afraid, Rape, and Kick questionnaire    Fear of Current or Ex-Partner: No    Emotionally Abused: No    Physically Abused: No    Sexually Abused: No    REVIEW OF SYSTEMS: New patient intake form was reviewed.  Complete 10-system review is negative except for the following: Changes in appetite, rash, wound, anxiety  PHYSICAL EXAM: BP 113/80 (BP Location: Left Arm, Patient Position: Sitting)   Pulse (!) 115   Temp 98.5 F (36.9 C) (Oral)   Resp 19   Ht 5' 1 (1.549 m)   Wt 116 lb 12.8 oz (53 kg)   LMP 05/15/2024   SpO2 100%   BMI 22.07 kg/m  Constitutional: No acute distress. Neuro/Psych: Alert, oriented.  Head and Neck: Normocephalic, atraumatic. Neck symmetric without masses. Sclera anicteric.  Respiratory: Normal work of breathing. Clear to auscultation bilaterally. Cardiovascular: Regular rate and rhythm, no murmurs, rubs, or  gallops. Abdomen: Normoactive bowel sounds. Dressing on her lower abdominal wall at site of wound/squamous cell carcinoma. Not fully evaluated due to patient discomfort.  Extremities: Grossly normal range of motion. Warm, well perfused. No edema bilaterally. Lymphatic: No cervical, supraclavicular adenopathy. Generalized firmness in  bilateral groins without discrete lymphadenopathy.  Genitourinary: External genitalia with two white raised lesions on the anterior right vulva. At the left lateral labia majora fold with the inner thigh, white thickened linear lesion with some ulceration at the posterior aspect. Urethral meatus without lesions or prolapse. On speculum exam, vagina without lesions.  Cervix deviated into the left upper corner. Pap smear collected. Exam chaperoned by Eleanor Epps, NP  Physical Exam Genitourinary:      COLPOSCOPY PROCEDURE NOTE  Procedure Details: After appropriate verbal informed consent was obtained, a timeout was performed. A sterile speculum was placed in the vagina. Acetic acid  was applied to the cervix with the findings as noted below. The cervix was then cleaned with betadine x3. Attempt was made to proceed with cervical biopsy and ECC but patient could not continue to tolerate speculum exam, so the exam was discontinued. Speculum was removed.   Adequate Exam: Transformation zone not visualized  Biopsy Specimen: None  Condition: Stable. Patient tolerated procedure well.  Complications: None  Findings: Acetowhite changes on the cervicovaginal junction at approximately 10:00.  Colposcopic Impression: CIN2    LABORATORY AND RADIOLOGIC DATA: Outside medical records were reviewed to synthesize the above history, along with the history and physical obtained during the visit.  Outside laboratory, pathology, and imaging reports were reviewed, with pertinent results below.  I personally reviewed the outside images.  WBC  Date Value Ref Range Status  03/16/2024 4.1 3.8 - 10.8 Thousand/uL Final   Hemoglobin  Date Value Ref Range Status  03/16/2024 7.9 (L) 11.7 - 15.5 g/dL Final  90/88/7974 7.1 (L) 11.1 - 15.9 g/dL Final   HCT  Date Value Ref Range Status  03/16/2024 26.2 (L) 35.0 - 45.0 % Final   Platelets  Date Value Ref Range Status  03/16/2024 375 140 - 400 Thousand/uL  Final   Platelet Count  Date Value Ref Range Status  02/25/2024 196 150 - 400 K/uL Final   LDH  Date Value Ref Range Status  03/02/2024 127 98 - 192 U/L Final    Comment:    Performed at [facility], [address] [place], [place] [address]   Magnesium   Date Value Ref Range Status  02/15/2024 1.6 (L) 1.7 - 2.4 mg/dL Final    Comment:    Performed at [facility], [address] [place], [place] [address]   Creatinine  Date Value Ref Range Status  02/25/2024 0.57 0.44 - 1.00 mg/dL Final   Creat  Date Value Ref Range Status  02/22/2024 0.59 0.50 - 0.99 mg/dL Final   Creatinine, Ser  Date Value Ref Range Status  03/01/2024 0.75 0.44 - 1.00 mg/dL Final   AST  Date Value Ref Range Status  03/01/2024 43 (H) 15 - 41 U/L Final  02/25/2024 18 15 - 41 U/L Final   ALT  Date Value Ref Range Status  03/01/2024 16 0 - 44 U/L Final  02/25/2024 12 0 - 44 U/L Final   Diagnosis  Date Value Ref Range Status  09/27/2020 - Low grade squamous intraepithelial lesion (LSIL) (A)  Corrected  03/10/2018 SQUAMOUS CELL CARCINOMA. (A)  Final   HPV  Date Value Ref Range Status  03/10/2018 DETECTED (A)  Final    Comment:    Normal Reference Range - NOT Detected    Surgical pathology (02/14/24): A. CHRONIC ABDOMINAL WALL WOUND, DEBRIDEMENT:  - Invasive squamous cell carcinoma, well-differentiated, depth of  invasion 0.2 cm (see comment).   Comment: This case was seen in peer review on 02/16/24 by Dr. Janel,  who concurs with the interpretation.  This result was reported to Dr. Curvin on 02/17/2024.   Surgical pathology (09/28/23): A. VULVA, LEFT, PUNCH BIOPSY:  - Spongiotic psoriasiform mucositis with lichen simplex chronicus.  - See comment.   COMMENT:  The pattern is an odd eczematous reaction and the differential includes  contact mucositis, irritant mucositis and drug reaction.  Patients with  HIV get unusual eczematous rashes.  This has  lichen simplex chronicus  superimposed on the eczema.  Immunohistochemistry for HHV 8 is negative.  PAS stain will be performed and reported as an addendum.     CT ABDOMEN PELVIS W CONTRAST 01/25/2024  Addendum 01/25/2024  4:11 PM ADDENDUM REPORT: 01/25/2024 16:08  ADDENDUM: Two nonobstructing right renal calculi measure up to 5 mm are present.   Electronically Signed By: Vanetta Chou M.D. On: 01/25/2024 16:08  Narrative CLINICAL DATA:  Abdominal pain.  EXAM: CT ABDOMEN AND PELVIS WITH CONTRAST  TECHNIQUE: Multidetector CT imaging of the abdomen and pelvis was performed using the standard protocol following bolus administration of intravenous contrast.  RADIATION DOSE REDUCTION: This exam was performed according to the departmental dose-optimization program which includes automated exposure control, adjustment of the mA and/or kV according to patient size and/or use of iterative reconstruction technique.  CONTRAST:  OMNIPAQUE  IOHEXOL  300 MG/ML  SOLN  COMPARISON:  CT abdomen pelvis dated 11/22/2023.  FINDINGS: Evaluation of this exam is limited due to respiratory motion.  Lower chest: The visualized lung bases are clear.  No intra-abdominal free air or free fluid.  Hepatobiliary: No focal liver abnormality is seen. No gallstones, gallbladder wall thickening, or biliary dilatation.  Pancreas: Unremarkable. No pancreatic ductal dilatation or surrounding inflammatory changes.  Spleen: Normal in size without focal abnormality.  Adrenals/Urinary Tract: The adrenal glands unremarkable. There is no hydronephrosis on either side. The visualized ureters and urinary bladder appear unremarkable.  Stomach/Bowel: There is no bowel obstruction or active inflammation. The appendix is normal.  Vascular/Lymphatic: The abdominal aorta and IVC unremarkable. No portal venous gas. There is no adenopathy.  Reproductive: The uterus is anteverted. No suspicious  adnexal masses.  Other: Anterior pelvic wall surgical incision with overlying dressing. No drainable fluid collection or abscess.  Musculoskeletal: No acute or significant osseous findings.  IMPRESSION: 1. No acute intra-abdominal or pelvic pathology. 2. Anterior pelvic wall surgical incision with overlying dressing. No drainable fluid collection or abscess.  Electronically Signed: By: Vanetta Chou M.D. On: 01/25/2024 16:04

## 2024-05-22 NOTE — Progress Notes (Signed)
 SDW CALL  Patient was given pre-op instructions over the phone. The opportunity was given for the patient to ask questions. No further questions asked. Patient verbalized understanding of instructions given.   PCP - Corean Fireman,  Cardiologist - denies  PPM/ICD - denies Device Orders - n/a Rep Notified -  n/a  Chest x-ray - 03/01/24 EKG - 02/12/24 Stress Test - denies ECHO - denies Cardiac Cath - denies  Sleep Study - denies  NO DM  Last dose of GLP1 agonist-  n/a GLP1 instructions: n/a  Blood Thinner Instructions: n/a Aspirin Instructions: n/a  ERAS Protcol - clears until 1315 PRE-SURGERY Ensure or G2- n/a  COVID TEST-  n/a   Anesthesia review: yes - anemia, palpitations but patient reports anxiety related and currently not any anxiety medications.  Patient stated that she has 2 teeth that need to be extracted but has not been able to get that scheduled.  She states that the teeth have been causing pain in which she takes ibuprofen  for.   Notified Nicole at Dr. Yvonne office  Patient denies shortness of breath, fever, cough and chest pain over the phone call   All instructions explained to the patient, with a verbal understanding of the material. Patient agrees to go over the instructions while at home for a better understanding.

## 2024-05-23 ENCOUNTER — Encounter (HOSPITAL_BASED_OUTPATIENT_CLINIC_OR_DEPARTMENT_OTHER): Admitting: General Surgery

## 2024-05-24 ENCOUNTER — Ambulatory Visit: Payer: Self-pay

## 2024-05-24 ENCOUNTER — Ambulatory Visit (HOSPITAL_COMMUNITY)
Admission: RE | Admit: 2024-05-24 | Discharge: 2024-05-24 | Disposition: A | Attending: General Surgery | Admitting: General Surgery

## 2024-05-24 ENCOUNTER — Encounter (HOSPITAL_COMMUNITY): Payer: Self-pay | Admitting: General Surgery

## 2024-05-24 ENCOUNTER — Ambulatory Visit (HOSPITAL_COMMUNITY): Payer: Self-pay | Admitting: Physician Assistant

## 2024-05-24 ENCOUNTER — Other Ambulatory Visit: Payer: Self-pay

## 2024-05-24 ENCOUNTER — Encounter (HOSPITAL_COMMUNITY)
Admission: RE | Disposition: A | Discharge status: Home / Self Care | Payer: Self-pay | Source: Home / Self Care | Attending: General Surgery

## 2024-05-24 HISTORY — DX: Pneumonia, unspecified organism: J18.9

## 2024-05-24 HISTORY — DX: Anemia, unspecified: D64.9

## 2024-05-24 HISTORY — DX: Unspecified osteoarthritis, unspecified site: M19.90

## 2024-05-24 HISTORY — PX: EXCISION MASS ABDOMINAL: SHX6701

## 2024-05-24 LAB — CBC
HCT: 28.5 % — ABNORMAL LOW (ref 36.0–46.0)
Hemoglobin: 8.5 g/dL — ABNORMAL LOW (ref 12.0–15.0)
MCH: 25.8 pg — ABNORMAL LOW (ref 26.0–34.0)
MCHC: 29.8 g/dL — ABNORMAL LOW (ref 30.0–36.0)
MCV: 86.4 fL (ref 80.0–100.0)
Platelets: 296 K/uL (ref 150–400)
RBC: 3.3 MIL/uL — ABNORMAL LOW (ref 3.87–5.11)
RDW: 17.2 % — ABNORMAL HIGH (ref 11.5–15.5)
WBC: 1.5 K/uL — ABNORMAL LOW (ref 4.0–10.5)
nRBC: 0 % (ref 0.0–0.2)

## 2024-05-24 LAB — COMPREHENSIVE METABOLIC PANEL WITH GFR
ALT: 13 U/L (ref 0–44)
AST: 26 U/L (ref 15–41)
Albumin: 2.3 g/dL — ABNORMAL LOW (ref 3.5–5.0)
Alkaline Phosphatase: 94 U/L (ref 38–126)
Anion gap: 9 (ref 5–15)
BUN: 14 mg/dL (ref 6–20)
CO2: 24 mmol/L (ref 22–32)
Calcium: 8 mg/dL — ABNORMAL LOW (ref 8.9–10.3)
Chloride: 105 mmol/L (ref 98–111)
Creatinine, Ser: 1.08 mg/dL — ABNORMAL HIGH (ref 0.44–1.00)
GFR, Estimated: >60 mL/min (ref >60)
Glucose, Bld: 81 mg/dL (ref 70–99)
Potassium: 3.6 mmol/L (ref 3.5–5.1)
Sodium: 138 mmol/L (ref 135–145)
Total Bilirubin: 1 mg/dL (ref 0.0–1.2)
Total Protein: 7.6 g/dL (ref 6.5–8.1)

## 2024-05-24 LAB — POCT PREGNANCY, URINE: Preg Test, Ur: NEGATIVE

## 2024-05-24 LAB — CYTOLOGY - PAP
Comment: NEGATIVE
Diagnosis: HIGH — AB
High risk HPV: POSITIVE — AB

## 2024-05-24 SURGERY — EXCISION, MASS, TORSO
Anesthesia: General | Site: Abdomen

## 2024-05-24 MED ORDER — PHENYLEPHRINE HCL (PRESSORS) 10 MG/ML IV SOLN
INTRAVENOUS | Status: DC | PRN
  Administered 2024-05-24: 80 ug via INTRAVENOUS

## 2024-05-24 MED ORDER — MIDAZOLAM HCL 2 MG/2ML IJ SOLN
INTRAMUSCULAR | Status: AC
  Filled 2024-05-24: qty 2

## 2024-05-24 MED ORDER — OXYCODONE HCL 5 MG PO TABS: 5.0000 mg | ORAL_TABLET | Freq: Three times a day (TID) | ORAL | 0 refills | Status: DC | PRN

## 2024-05-24 MED ORDER — ROCURONIUM BROMIDE 100 MG/10ML IV SOLN
INTRAVENOUS | Status: DC | PRN
  Administered 2024-05-24: 50 mg via INTRAVENOUS

## 2024-05-24 MED ORDER — LACTATED RINGERS IV SOLN: INTRAVENOUS | Status: DC | PRN

## 2024-05-24 MED ORDER — LACTATED RINGERS IV SOLN: INTRAVENOUS | Status: DC

## 2024-05-24 MED ORDER — OXYCODONE HCL 5 MG PO TABS
ORAL_TABLET | ORAL | Status: AC
  Filled 2024-05-24: qty 1

## 2024-05-24 MED ORDER — FENTANYL CITRATE (PF) 250 MCG/5ML IJ SOLN
INTRAMUSCULAR | Status: DC | PRN
  Administered 2024-05-24 (×2): 50 ug via INTRAVENOUS

## 2024-05-24 MED ORDER — OXYCODONE HCL 5 MG PO TABS
5.0000 mg | ORAL_TABLET | Freq: Once | ORAL | Status: AC | PRN
  Administered 2024-05-24: 5 mg via ORAL

## 2024-05-24 MED ORDER — PHENYLEPHRINE HCL-NACL 20-0.9 MG/250ML-% IV SOLN
INTRAVENOUS | Status: DC | PRN
  Administered 2024-05-24: 35 ug/min via INTRAVENOUS

## 2024-05-24 MED ORDER — GABAPENTIN 100 MG PO CAPS
100.0000 mg | ORAL_CAPSULE | ORAL | Status: AC
  Administered 2024-05-24: 100 mg via ORAL
  Filled 2024-05-24: qty 1

## 2024-05-24 MED ORDER — 0.9 % SODIUM CHLORIDE (POUR BTL) OPTIME
TOPICAL | Status: DC | PRN
  Administered 2024-05-24: 150 mL

## 2024-05-24 MED ORDER — ORAL CARE MOUTH RINSE: 15.0000 mL | Freq: Once | OROMUCOSAL | Status: AC

## 2024-05-24 MED ORDER — CHLORHEXIDINE GLUCONATE 0.12 % MT SOLN
15.0000 mL | Freq: Once | OROMUCOSAL | Status: AC
  Administered 2024-05-24: 15 mL via OROMUCOSAL
  Filled 2024-05-24: qty 15

## 2024-05-24 MED ORDER — CHLORHEXIDINE GLUCONATE CLOTH 2 % EX PADS: 6.0000 | MEDICATED_PAD | Freq: Once | CUTANEOUS | Status: DC

## 2024-05-24 MED ORDER — LIDOCAINE HCL (CARDIAC) PF 100 MG/5ML IV SOSY
PREFILLED_SYRINGE | INTRAVENOUS | Status: DC | PRN
  Administered 2024-05-24: 60 mg via INTRATRACHEAL

## 2024-05-24 MED ORDER — FENTANYL CITRATE (PF) 100 MCG/2ML IJ SOLN
25.0000 ug | INTRAMUSCULAR | Status: DC | PRN
  Administered 2024-05-24: 50 ug via INTRAVENOUS
  Administered 2024-05-24: 25 ug via INTRAVENOUS

## 2024-05-24 MED ORDER — ACETAMINOPHEN 500 MG PO TABS
1000.0000 mg | ORAL_TABLET | ORAL | Status: AC
  Administered 2024-05-24: 1000 mg via ORAL
  Filled 2024-05-24: qty 2

## 2024-05-24 MED ORDER — BUPIVACAINE HCL (PF) 0.25 % IJ SOLN
INTRAMUSCULAR | Status: AC
  Filled 2024-05-24: qty 30

## 2024-05-24 MED ORDER — BUPIVACAINE-EPINEPHRINE 0.25% -1:200000 IJ SOLN: INTRAMUSCULAR | Status: DC | PRN

## 2024-05-24 MED ORDER — FENTANYL CITRATE (PF) 250 MCG/5ML IJ SOLN
INTRAMUSCULAR | Status: AC
  Filled 2024-05-24: qty 5

## 2024-05-24 MED ORDER — ONDANSETRON HCL 4 MG/2ML IJ SOLN
INTRAMUSCULAR | Status: DC | PRN
  Administered 2024-05-24: 4 mg via INTRAVENOUS

## 2024-05-24 MED ORDER — SUGAMMADEX SODIUM 200 MG/2ML IV SOLN
INTRAVENOUS | Status: DC | PRN
  Administered 2024-05-24: 200 mg via INTRAVENOUS

## 2024-05-24 MED ORDER — OXYCODONE HCL 5 MG/5ML PO SOLN: 5.0000 mg | Freq: Once | ORAL | Status: AC | PRN

## 2024-05-24 MED ORDER — PROPOFOL 10 MG/ML IV BOLUS
INTRAVENOUS | Status: DC | PRN
  Administered 2024-05-24: 20 mg via INTRAVENOUS
  Administered 2024-05-24: 50 mg via INTRAVENOUS

## 2024-05-24 MED ORDER — MIDAZOLAM HCL 5 MG/5ML IJ SOLN
INTRAMUSCULAR | Status: DC | PRN
  Administered 2024-05-24: 2 mg via INTRAVENOUS

## 2024-05-24 MED ORDER — BUPIVACAINE HCL 0.25 % IJ SOLN
INTRAMUSCULAR | Status: DC | PRN
  Administered 2024-05-24: 20 mL

## 2024-05-24 MED ORDER — BACITRACIN-NEOMYCIN-POLYMYXIN OINTMENT TUBE
TOPICAL_OINTMENT | CUTANEOUS | Status: AC
  Filled 2024-05-24: qty 14.17

## 2024-05-24 MED ORDER — TRIPLE ANTIBIOTIC 3.5-400-5000 EX OINT
TOPICAL_OINTMENT | CUTANEOUS | Status: DC | PRN
  Administered 2024-05-24: 1 via TOPICAL

## 2024-05-24 MED ORDER — CEFAZOLIN SODIUM-DEXTROSE 2-4 GM/100ML-% IV SOLN
2.0000 g | INTRAVENOUS | Status: AC
  Administered 2024-05-24: 2 g via INTRAVENOUS
  Filled 2024-05-24: qty 100

## 2024-05-24 MED ORDER — ACETAMINOPHEN 10 MG/ML IV SOLN: 1000.0000 mg | Freq: Once | INTRAVENOUS | Status: DC | PRN

## 2024-05-24 MED ORDER — FENTANYL CITRATE (PF) 100 MCG/2ML IJ SOLN
INTRAMUSCULAR | Status: AC
  Filled 2024-05-24: qty 2

## 2024-05-24 MED ORDER — DEXAMETHASONE SOD PHOSPHATE PF 10 MG/ML IJ SOLN
INTRAMUSCULAR | Status: DC | PRN
  Administered 2024-05-24: 10 mg via INTRAVENOUS

## 2024-05-24 SURGICAL SUPPLY — 27 items
BAG COUNTER SPONGE SURGICOUNT (BAG) ×2 IMPLANT
CANISTER SUCTION 3000ML PPV (SUCTIONS) IMPLANT
CHLORAPREP W/TINT 26 (MISCELLANEOUS) ×2 IMPLANT
COVER SURGICAL LIGHT HANDLE (MISCELLANEOUS) ×2 IMPLANT
DERMABOND ADVANCED .7 DNX12 (GAUZE/BANDAGES/DRESSINGS) ×2 IMPLANT
DRAPE LAPAROTOMY 100X72 PEDS (DRAPES) ×2 IMPLANT
ELECT COATED BLADE 2.86 ST (ELECTRODE) ×2 IMPLANT
ELECTRODE REM PT RTRN 9FT ADLT (ELECTROSURGICAL) ×2 IMPLANT
GAUZE SPONGE 4X4 12PLY STRL (GAUZE/BANDAGES/DRESSINGS) IMPLANT
GLOVE BIO SURGEON STRL SZ7.5 (GLOVE) ×2 IMPLANT
GOWN STRL REUS W/ TWL LRG LVL3 (GOWN DISPOSABLE) ×4 IMPLANT
KIT BASIN OR (CUSTOM PROCEDURE TRAY) ×2 IMPLANT
KIT TURNOVER KIT B (KITS) ×2 IMPLANT
NDL HYPO 25GX1X1/2 BEV (NEEDLE) ×2 IMPLANT
PACK GENERAL/GYN (CUSTOM PROCEDURE TRAY) ×2 IMPLANT
PAD ARMBOARD POSITIONER FOAM (MISCELLANEOUS) ×2 IMPLANT
PENCIL SMOKE EVACUATOR (MISCELLANEOUS) ×2 IMPLANT
SOLN 0.9% NACL POUR BTL 1000ML (IV SOLUTION) ×2 IMPLANT
SPIKE FLUID TRANSFER (MISCELLANEOUS) IMPLANT
SUT ETHILON 2 0 FS 18 (SUTURE) IMPLANT
SUT MNCRL AB 4-0 PS2 18 (SUTURE) ×2 IMPLANT
SUT SILK 2 0 SH (SUTURE) IMPLANT
SUT VIC AB 2-0 SH 27XBRD (SUTURE) IMPLANT
SUT VIC AB 3-0 SH 27XBRD (SUTURE) ×2 IMPLANT
SYR CONTROL 10ML LL (SYRINGE) ×2 IMPLANT
TOWEL GREEN STERILE (TOWEL DISPOSABLE) ×2 IMPLANT
TOWEL GREEN STERILE FF (TOWEL DISPOSABLE) ×2 IMPLANT

## 2024-05-24 NOTE — Anesthesia Preprocedure Evaluation (Signed)
 Anesthesia Evaluation  Patient identified by MRN, date of birth, ID band Patient awake    Reviewed: Allergy & Precautions, NPO status , [name] Chart, lab work & pertinent test results  History of Anesthesia Complications Negative for: history of anesthetic complications  Airway Mallampati: I  TM Distance: >3 FB Neck ROM: Full    Dental  (+) Teeth Intact, Dental Advisory Given,    Pulmonary neg shortness of breath, asthma , neg sleep apnea, neg COPD, neg recent URI, Current Smoker and Patient abstained from smoking.   breath sounds clear to auscultation       Cardiovascular negative cardio ROS  Rhythm:Regular     Neuro/Psych  Headaches PSYCHIATRIC DISORDERS Anxiety Depression       GI/Hepatic   Endo/Other  negative endocrine ROS    Renal/GU Lab Results      Component                Value               Date                      NA                       140                 03/01/2024                K                        3.9                 03/01/2024                CO2                      23                  03/01/2024                GLUCOSE                  85                  03/01/2024                BUN                      15                  03/01/2024                CREATININE               0.75                03/01/2024                CALCIUM                   8.6 (L)             03/01/2024                GFRNONAA                 >60  03/01/2024                Musculoskeletal  (+) Arthritis ,    Abdominal   Peds  Hematology  (+) Blood dyscrasia, anemia , HIVLab Results      Component                Value               Date                      WBC                      1.5 (L)             05/24/2024                HGB                      8.5 (L)             05/24/2024                HCT                      28.5 (L)            05/24/2024                MCV                      86.4                 05/24/2024                PLT                      296                 05/24/2024              Anesthesia Other Findings   Reproductive/Obstetrics                              Anesthesia Physical Anesthesia Plan  ASA: 5  Anesthesia Plan: General   Post-op Pain Management: Tylenol  PO (pre-op)* and Gabapentin  PO (pre-op)*   Induction: Intravenous  PONV Risk Score and Plan: 3 and Ondansetron  and Dexamethasone   Airway Management Planned: LMA  Additional Equipment: None  Intra-op Plan:   Post-operative Plan: Extubation in OR  Informed Consent: I have reviewed the patients History and Physical, chart, labs and discussed the procedure including the risks, benefits and alternatives for the proposed anesthesia with the patient or authorized representative who has indicated his/her understanding and acceptance.     Dental advisory given  Plan Discussed with: CRNA  Anesthesia Plan Comments:          Anesthesia Quick Evaluation

## 2024-05-24 NOTE — Interval H&P Note (Signed)
 History and Physical Interval Note:  05/24/2024 1:43 PM  Beverly [name]  has presented today for surgery, with the diagnosis of SQUAMOUS CELL CANCER LOWER ABDOMINAL WALL.  The various methods of treatment have been discussed with the patient and family. After consideration of risks, benefits and other options for treatment, the patient has consented to  Procedure(s) with comments: EXCISION, MASS, TORSO (N/A) - EXCISION SQUAMOUS ELL CANCER LOWER ABDOMINAL WALL as a surgical intervention.  The patient's history has been reviewed, patient examined, no change in status, stable for surgery.  I have reviewed the patient's chart and labs.  Questions were answered to the patient's satisfaction.     Deward Null III

## 2024-05-24 NOTE — Anesthesia Procedure Notes (Signed)
 Procedure Name: Intubation Date/Time: 05/24/2024 2:36 PM  Performed by: Arvell Edsel HERO, CRNAPre-anesthesia Checklist: Patient identified, Emergency Drugs available, Suction available, Patient being monitored and Timeout performed Patient Re-evaluated:Patient Re-evaluated prior to induction Oxygen Delivery Method: Circle system utilized Preoxygenation: Pre-oxygenation with 100% oxygen Induction Type: IV induction Ventilation: Mask ventilation without difficulty Laryngoscope Size: Mac and 3 Grade View: Grade I Tube type: Oral Tube size: 7.0 mm Number of attempts: 1 Airway Equipment and Method: Patient positioned with wedge pillow and Stylet Placement Confirmation: ETT inserted through vocal cords under direct vision, positive ETCO2 and breath sounds checked- equal and bilateral Secured at: 22 cm Tube secured with: Tape Dental Injury: Teeth and Oropharynx as per pre-operative assessment

## 2024-05-24 NOTE — Telephone Encounter (Signed)
 2nd attempt to reach patient to relay results. Left voicemail requesting call back to [phone].

## 2024-05-24 NOTE — Telephone Encounter (Signed)
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 Please let her know we will get additional samples and take a closer look at her  upcoming procedure. ----- Message ----- From: Interface, Lab In Three Zero One Sent: 05/24/2024   2:00 PM EST To: Beverly JONETTA Epps, NP

## 2024-05-24 NOTE — Op Note (Signed)
 05/24/2024  3:50 PM  PATIENT:  Beverly [name]  43 y.o. female  PRE-OPERATIVE DIAGNOSIS:  SQUAMOUS CELL CANCER LOWER ABDOMINAL WALL  POST-OPERATIVE DIAGNOSIS:  SQUAMOUS CELL CANCER LOWER ABDOMINAL WALL  PROCEDURE:  Procedure(s) with comments: EXCISION, MASS, TORSO (N/A) - EXCISION SQUAMOUS ELL CANCER LOWER ABDOMINAL WALL  SURGEON:  Surgeons and Role:    * Curvin Deward MOULD, MD - Primary  PHYSICIAN ASSISTANT:   ASSISTANTS: none   ANESTHESIA:   local and general  EBL:  minimal   BLOOD ADMINISTERED:none  DRAINS: none   LOCAL MEDICATIONS USED:  MARCAINE      SPECIMEN:  Source of Specimen:  squamous cell cancer abdominal wall  DISPOSITION OF SPECIMEN:  PATHOLOGY  COUNTS:  YES  TOURNIQUET:  * No tourniquets in log *  DICTATION: .Dragon Dictation  After informed consent was obtained the patient was brought to the operating room and placed in the supine position on the operating room table.  After adequate induction of general anesthesia the patient's abdominal wall was prepped with Betadine and draped in usual sterile manner.  An appropriate timeout was performed.  The patient had an irregular open wound in her suprapubic area.  The area around this wound was infiltrated with quarter percent Marcaine .  An elliptical incision was made around this open wound where the skin appeared healthy the with a 10 blade knife.  The incision was carried through the skin and subcutaneous tissue sharply with electrocautery.  Dissection was then carried into the subcutaneous tissue and the entire wound and skin were removed en bloc.  The specimen was marked with a short stitch on the superior surface and a long stitch on the left lateral surface.  The tissue was sent to pathology for frozen section of representative edges and that came back negative.  Hemostasis was achieved using the Bovie electrocautery.  The wound was irrigated with saline and infiltrated with more quarter percent Marcaine .  The deep  layer of the wound was then closed with a running 2-0 Vicryl stitch.  The skin incision was then closed with by alternating 2-0 nylon simple stitches with 2-0 nylon vertical mattress stitches.  The incision was then coated with antibiotic ointment and covered with sterile gauze dressings.  The patient tolerated the procedure well.  At the end of the case all needle sponge and instrument counts were correct.  The patient was then awakened and taken to recovery in stable condition.  The excised tissue measured approximately 20 x 5 cm.  PLAN OF CARE: Discharge to home after PACU  PATIENT DISPOSITION:  PACU - hemodynamically stable.   Delay start of Pharmacological VTE agent (>24hrs) due to surgical blood loss or risk of bleeding: not applicable

## 2024-05-24 NOTE — Telephone Encounter (Signed)
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 Please let her know we will get additional samples and take a closer look at her  upcoming procedure. ----- Message ----- From: Interface, Lab In Three Zero One Sent: 05/24/2024   2:00 PM EST To: Eleanor JONETTA Epps, NP

## 2024-05-24 NOTE — Transfer of Care (Signed)
 Immediate Anesthesia Transfer of Care Note  Patient: Beverly [name]  Procedure(s) Performed: EXCISION, MASS, TORSO (Abdomen)  Patient Location: PACU  Anesthesia Type:General  Level of Consciousness: drowsy and patient cooperative  Airway & Oxygen Therapy: Patient Spontanous Breathing and Patient connected to face mask oxygen  Post-op Assessment: Report given to RN, Post -op Vital signs reviewed and stable, Patient moving all extremities, and Patient moving all extremities X 4  Post vital signs: Reviewed and stable  Last Vitals:  Vitals Value Taken Time  BP 120/70 05/24/24 16:00  Temp    Pulse 80 05/24/24  16:00  Resp 12 05/24/24 16:00  SpO2 100 05/24/24 16:00    Last Pain:  Vitals:   05/24/24 1349  TempSrc:   PainSc: 0-No pain         Complications: No notable events documented.

## 2024-05-24 NOTE — H&P (Signed)
 PROVIDER: DEWARD GARNETTE NULL, MD  MRN: [id number] DOB: 08/28/1980 Subjective   Chief Complaint: Post Operative Visit   History of Present Illness: Beverly [name] is a 43 y.o. female who is seen today for skin cancer. The patient is a 43 year old black female who was hospitalized a couple months ago. During that time she was noted to have a nonhealing wound along her lower abdominal wall. This was biopsied and came back as squamous cell cancer. She is following up now to schedule definitive excision of the area. She does have HIV which is being managed by the infectious disease doctors    Review of Systems: A complete review of systems was obtained from the patient. I have reviewed this information and discussed as appropriate with the patient. See HPI as well for other ROS.  ROS   Medical History: Past Medical History:  Diagnosis Date  Anemia  Anxiety  Asthma, unspecified asthma severity, unspecified whether complicated, unspecified whether persistent (HHS-HCC)  HIV -AIDS with opportunistic infection, Symptomatic (CMS/HHS-HCC)   Patient Active Problem List  Diagnosis  Squamous cell skin cancer   Past Surgical History:  Procedure Laterality Date  e/o sqaumaous cell carcinoma    Allergies  Allergen Reactions  Milk Rash  Others Rash  Uncoded Allergy. Allergen: SQUASH  Other Other (See Comments) and Swelling  Swelling and puffiness in hands and legs after Ferrlecit  250 mg infused over 60 minutes (possibly infusion related reaction)  Morphine  Hives, Itching and Other (See Comments)  And- Tolerates hydrocodone , oxycodone , and codeine with no issues   Current Outpatient Medications on File Prior to Visit  Medication Sig Dispense Refill  bictegravir-emtricitabine -tenofovir  alafenamide (BIKTARVY ) 50-200-25 mg tablet Take by mouth once daily  lamoTRIgine (LAMICTAL) 25 MG tablet Take 25 mg by mouth every morning  pantoprazole  (PROTONIX ) 40 MG DR tablet Take 40 mg by mouth once daily   pregabalin  (LYRICA ) 25 MG capsule Take 25 mg by mouth 2 (two) times daily   No current facility-administered medications on file prior to visit.   Family History  Problem Relation Age of Onset  High blood pressure (Hypertension) Father  High blood pressure (Hypertension) Sister    Social History   Tobacco Use  Smoking Status Every Day  Types: Cigarettes  Smokeless Tobacco Never    Social History   Socioeconomic History  Marital status: Single  Tobacco Use  Smoking status: Every Day  Types: Cigarettes  Smokeless tobacco: Never   Social Drivers of Architectural Technologist Insecurity: Food Insecurity Present (02/25/2024)  Received from [facility]  Hunger Vital Sign  Within the past 12 months, you worried that your food would run out before you got the money to buy more.: Sometimes true  Within the past 12 months, the food you bought just didn't last and you didn't have money to get more.: Sometimes true  Transportation Needs: No Transportation Needs (02/25/2024)  Received from [facility] - Transportation  In the past 12 months, has lack of transportation kept you from medical appointments or from getting medications?: No  In the past 12 months, has lack of transportation kept you from meetings, work, or from getting things needed for daily living?: No  Recent Concern: Transportation Needs - Unmet Transportation Needs (02/12/2024)  Received from [facility] - Transportation  In the past 12 months, has lack of transportation kept you from medical appointments or from getting medications?: Yes  In the past 12 months, has lack of transportation kept you from meetings, work,  or from getting things needed for daily living?: Yes   Objective:   Vitals:  Weight: 52.3 kg (115 lb 6.4 oz)  Height: 180.3 cm (5' 11)   Body mass index is 16.1 kg/m.  Physical Exam Vitals reviewed.  Constitutional:  General: She is not in acute distress. Appearance: Normal appearance.   HENT:  Head: Normocephalic and atraumatic.  Right Ear: External ear normal.  Left Ear: External ear normal.  Nose: Nose normal.  Mouth/Throat:  Mouth: Mucous membranes are moist.  Pharynx: Oropharynx is clear.  Eyes:  General: No scleral icterus. Extraocular Movements: Extraocular movements intact.  Conjunctiva/sclera: Conjunctivae normal.  Pupils: Pupils are equal, round, and reactive to light.  Cardiovascular:  Rate and Rhythm: Normal rate and regular rhythm.  Pulses: Normal pulses.  Heart sounds: Normal heart sounds.  Pulmonary:  Effort: Pulmonary effort is normal. No respiratory distress.  Breath sounds: Normal breath sounds.  Abdominal:  General: Bowel sounds are normal.  Palpations: Abdomen is soft.  Tenderness: There is no abdominal tenderness.  Comments: the suprapubic wound seems to be smaller than what we saw a couple months ago.  Musculoskeletal:  General: No swelling, tenderness or deformity. Normal range of motion.  Cervical back: Normal range of motion and neck supple.  Skin: General: Skin is warm and dry.  Coloration: Skin is not jaundiced.  Neurological:  General: No focal deficit present.  Mental Status: She is alert and oriented to person, place, and time.  Psychiatric:  Mood and Affect: Mood normal.  Behavior: Behavior normal.     Labs, Imaging and Diagnostic Testing:  Assessment and Plan:   Diagnoses and all orders for this visit:  Squamous cell skin cancer - [facility] Case Posting Request; Future    The patient appears to have an area of squamous cell cancer and a nonhealing wound on her suprapubic area. At this point I would recommend wide excision of the area. I have discussed with her in detail the risks and benefits of the operation as well as some of the technical aspects and she understands and wishes to proceed. Once we have clean margins we will also refer her to oncology to see if anything else needs to be considered

## 2024-05-24 NOTE — Telephone Encounter (Signed)
 Per Eleanor Epps NP, I LVM fro [name] to call the office in regards to recent pap smear results.

## 2024-05-25 ENCOUNTER — Encounter (HOSPITAL_COMMUNITY): Payer: Self-pay | Admitting: General Surgery

## 2024-05-25 ENCOUNTER — Other Ambulatory Visit (HOSPITAL_COMMUNITY): Payer: Self-pay

## 2024-05-25 NOTE — Telephone Encounter (Signed)
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 Please let her know we will get additional samples and take a closer look at her  upcoming procedure. ----- Message ----- From: Interface, Lab In Three Zero One Sent: 05/24/2024   2:00 PM EST To: Eleanor JONETTA Epps, NP

## 2024-05-25 NOTE — Telephone Encounter (Signed)
 Spoke with Ms. 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 Dr. Rogelio will get additional samples and take a closer look at upcoming procedure on 12/10. Pt verbalized understanding and reminded of her pre-admit appt. On 12/8 and her pre-op call on 12/9. Pt thanked the office.

## 2024-05-25 NOTE — Telephone Encounter (Signed)
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 Please let her know we will get additional samples and take a closer look at her  upcoming procedure. ----- Message ----- From: Interface, Lab In Three Zero One Sent: 05/24/2024   2:00 PM EST To: Beverly JONETTA Epps, NP

## 2024-05-25 NOTE — Telephone Encounter (Signed)
 3rd attempt to reach Beverly [name],leaving a voicemail,  regarding pap smear results. I sent a MyChart message for her to also call the office

## 2024-05-25 NOTE — Patient Instructions (Signed)
 SURGICAL WAITING ROOM VISITATION Patients having surgery or a procedure may have no more than 2 support people in the waiting area - these visitors may rotate in the visitor waiting room.   Due to an increase in RSV and influenza rates and associated hospitalizations, children ages 28 and under may not visit patients in [facility] hospitals. If the patient needs to stay at the hospital during part of their recovery, the visitor guidelines for inpatient rooms apply.  PRE-OP VISITATION  Pre-op nurse will coordinate an appropriate time for 1 support person to accompany the patient in pre-op.  This support person may not rotate.  This visitor will be contacted when the time is appropriate for the visitor to come back in the pre-op area.  Please refer to the [facility] website for the visitor guidelines for Inpatients (after your surgery is over and you are in a regular room).  You are not required to quarantine at this time prior to your surgery. However, you must do this: Hand Hygiene often Do NOT share personal items Notify your provider if you are in close contact with someone who has COVID or you develop fever 100.4 or greater, new onset of sneezing, cough, sore throat, shortness of breath or body aches.  If you test positive for Covid or have been in contact with anyone that has tested positive in the last 10 days please notify you surgeon.    Your procedure is scheduled on: 05/31/24   Report to [facility] Main Entrance: [address] entrance where the [employer] is available.   Report to admitting at: 1:25 PM  Call this number if you have any questions or problems the morning of surgery [phone]  FOLLOW ANY ADDITIONAL PRE OP INSTRUCTIONS YOU RECEIVED FROM YOUR SURGEON'S OFFICE!!!  Do not eat food after Midnight the night prior to your surgery/procedure.  After Midnight you may have the following liquids until : 12:30 PM DAY OF SURGERY  Clear Liquid Diet Water Black  Coffee (sugar ok, NO MILK/CREAM OR CREAMERS)  Tea (sugar ok, NO MILK/CREAM OR CREAMERS) regular and decaf                             Plain Jell-O  with no fruit (NO RED)                                           Fruit ices (not with fruit pulp, NO RED)                                     Popsicles (NO RED)                                                                  Juice: NO CITRUS JUICES: only apple, WHITE grape, WHITE cranberry Sports drinks like Gatorade or Powerade (NO RED)               Oral Hygiene is also important to reduce your risk of infection.  Remember - BRUSH YOUR TEETH THE MORNING OF SURGERY WITH YOUR REGULAR TOOTHPASTE  Do NOT smoke after Midnight the night before surgery.  STOP TAKING all Vitamins, Herbs and supplements 1 week before your surgery.   Take ONLY these medicines the morning of surgery with A SIP OF WATER: pregabalin ,bictegravir,pantoprazole .Tylenol  as needed.  You may not have any metal on your body including hair pins, jewelry, and body piercing  Do not wear make-up, lotions, powders, perfumes / cologne, or deodorant  Do not wear nail polish including gel and S&S, artificial / acrylic nails, or any other type of covering on natural nails including finger and toenails. If you have artificial nails, gel coating, etc., that needs to be removed by a nail salon, Please have this removed prior to surgery. Not doing so may mean that your surgery could be cancelled or delayed if the Surgeon or anesthesia staff feels like they are unable to monitor you safely.   Do not shave 48 hours prior to surgery to avoid nicks in your skin which may contribute to postoperative infections.   Contacts, Hearing Aids, dentures or bridgework may not be worn into surgery. DENTURES WILL BE REMOVED PRIOR TO SURGERY PLEASE DO NOT APPLY Poly grip OR ADHESIVES!!!  You may bring a small overnight bag with you on the day of surgery, only pack items that are not valuable. [facility] IS NOT RESPONSIBLE   FOR VALUABLES THAT ARE LOST OR STOLEN.   Patients discharged on the day of surgery will not be allowed to drive home.  Someone NEEDS to stay with you for the first 24 hours after anesthesia.  Do not bring your home medications to the hospital. The Pharmacy will dispense medications listed on your medication list to you during your admission in the Hospital.  Special Instructions: Bring a copy of your healthcare power of attorney and living will documents the day of surgery, if you wish to have them scanned into your [facility] Records- EPIC  Please read over the following fact sheets you were given: IF YOU HAVE QUESTIONS ABOUT YOUR PRE-OP INSTRUCTIONS, PLEASE CALL [phone]   [facility] - Preparing for Surgery      Before surgery, you can play an important role.  Because skin is not sterile, your skin needs to be as free of germs as possible.  You can reduce the number of germs on your skin by washing with CHG (chlorahexidine gluconate) soap before surgery.  CHG is an antiseptic cleaner which kills germs and bonds with the skin to continue killing germs even after washing. Please DO NOT use if you have an allergy to CHG or antibacterial soaps.  If your skin becomes reddened/irritated stop using the CHG and inform your nurse when you arrive at Short Stay. Do not shave (including legs and underarms) for at least 48 hours prior to the first CHG shower.  You may shave your face/neck.  Please follow these instructions carefully:  1.  Shower with CHG Soap the night before surgery ONLY (DO NOT USE THE SOAP THE MORNING OF SURGERY).  2.  If you choose to wash your hair, wash your hair first as usual with your normal  shampoo.  3.  After you shampoo, rinse your hair and body thoroughly to remove the shampoo.                             4.  Use CHG as you would any  other liquid soap.  You can apply chg directly to the skin and wash.  Gently with a scrungie or clean  washcloth.  5.  Apply the CHG Soap to your body ONLY FROM THE NECK DOWN.   Do not use on face/ open                           Wound or open sores. Avoid contact with eyes, ears mouth and genitals (private parts).                       Wash face,  Genitals (private parts) with your normal soap.             6.  Wash thoroughly, paying special attention to the area where your  surgery  will be performed.  7.  Thoroughly rinse your body with warm water from the neck down.  8.  DO NOT shower/wash with your normal soap after using and rinsing off the CHG Soap.                9.  Pat yourself dry with a clean towel.            10.  Wear clean pajamas.            11.  Place clean sheets on your bed the night of your first shower and do not  sleep with pets.  Day of Surgery : Do not apply any CHG, lotions/deodorants the morning of surgery.  Please wear clean clothes to the hospital/surgery center.   FAILURE TO FOLLOW THESE INSTRUCTIONS MAY RESULT IN THE CANCELLATION OF YOUR SURGERY  PATIENT SIGNATURE_________________________________  NURSE SIGNATURE__________________________________  ________________________________________________________________________

## 2024-05-25 NOTE — Anesthesia Postprocedure Evaluation (Signed)
 Anesthesia Post Note  Patient: Beverly [name]  Procedure(s) Performed: EXCISION, MASS, TORSO (Abdomen)     Patient location during evaluation: PACU Anesthesia Type: General Level of consciousness: awake and alert Pain management: pain level controlled Vital Signs Assessment: post-procedure vital signs reviewed and stable Respiratory status: spontaneous breathing, nonlabored ventilation and respiratory function stable Cardiovascular status: blood pressure returned to baseline and stable Postop Assessment: no apparent nausea or vomiting Anesthetic complications: no   No notable events documented.             Ouita Nish

## 2024-05-26 ENCOUNTER — Ambulatory Visit: Payer: Self-pay | Admitting: General Surgery

## 2024-05-26 LAB — SURGICAL PATHOLOGY

## 2024-05-27 LAB — HIV-1 GENOTYPE: HIV-1 Genotype: DETECTED — AB

## 2024-05-27 LAB — HIV-1 INTEGRASE GENOTYPE

## 2024-05-27 LAB — HIV RNA, RTPCR W/R GT (RTI, PI,INT)
HIV 1 RNA Quant: 419 {copies}/mL — ABNORMAL HIGH
HIV-1 RNA Quant, Log: 2.62 {Log_copies}/mL — ABNORMAL HIGH

## 2024-05-29 ENCOUNTER — Encounter (HOSPITAL_COMMUNITY): Attending: Obstetrics & Gynecology

## 2024-05-29 ENCOUNTER — Ambulatory Visit: Payer: Self-pay | Admitting: Pharmacist

## 2024-05-29 ENCOUNTER — Encounter (HOSPITAL_COMMUNITY): Payer: Self-pay | Admitting: Obstetrics & Gynecology

## 2024-05-29 NOTE — Progress Notes (Addendum)
 For Anesthesia: PCP - Melvenia Corean SAILOR, NP  Cardiologist - N/A  Bowel Prep reminder:  Chest x-ray - 03/01/24 EKG - 02/12/24 Stress Test -  ECHO -  Cardiac Cath -  Pacemaker/ICD device last checked: Pacemaker orders received: Device Rep notified:  Spinal Cord Stimulator:N/A  Sleep Study - N/A CPAP -   Fasting Blood Sugar - N/A Checks Blood Sugar _____ times a day Date and result of last Hgb A1c-  Last dose of GLP1 agonist- N/A GLP1 instructions: Hold 7 days prior to schedule (Hold 24 hours-daily)   Last dose of SGLT-2 inhibitors-  SGLT-2 instructions: Hold 72 hours prior to surgery  Blood Thinner Instructions:N/A Last Dose: Time last taken:  Aspirin Instructions:N/A Last Dose: Time last taken:  Activity level: Can go up a flight of stairs and activities of daily living without stopping and without chest pain and/or shortness of breath   Able to exercise without chest pain and/or shortness of breath    Anesthesia review: Hx: Smoker,palpitations(anxiety related as per pt.).  Patient denies shortness of breath, fever, cough and chest pain at PAT appointment   Patient verbalized understanding of instructions that were reviewed over the telephone.

## 2024-05-29 NOTE — Hospital Course (Signed)
 SABRA

## 2024-05-29 NOTE — H&P (Signed)
 GYNECOLOGIC ONCOLOGY NEW PATIENT CONSULTATION   Date of Service: 05/22/2024 Referring Provider: Lisandro Montorfano, MD     ASSESSMENT AND PLAN: Beverly [name] is a 43 y.o. 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   Reviewed pt's history of cervical dysplasia and vulvar biopsies in the past. 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 Note on path is that individuals with HIV get unusual eczematous rashes.   Reviewed with patient that given it has been some time since her prior evaluation, and given her current abdominal wall SCC, her cervix and vulva warrants additional evaluation.   Pap smear collected today. Colposcopy performed but biopsies were not able to be obtained due to patient discomfort and anxiety surrounding exam. One are of acetowhite changes noted at about 10 o'clock which would be biopsied. Given no transformation zone visualized, would also recommend ECC.   In terms of her vulva, the linear white crusting and ulcerated lesion on the left vulva/thigh fold would benefit from biopsy. In the meantime, given that prior biopsies suggested lichen simplex chronicus and eczema, steroid ointment rx sent to initiate. Otherwise, would recommend additional biopsy of the anterior raised small lesions for further evaluation.   Recommend additional biopsies in OR for patient tolerance/comfort.   Patient was consented for: Exam under anesthesia, vulvar biopsies, cervical biopsy, endocervical curettage on 05/31/2024. Will set this up with my colleague, Dr. Leonce Ada.   The risks of surgery were discussed in detail and she understands these to including but not limited to bleeding requiring a blood transfusion, infection, injury  to adjacent organs (including but not limited to the vagina, rectum, urethra, nearby blood vessels/nerves), wound separation, unforseen complication, and possible need for re-exploration.   If the patient experiences any of these events, she understands that her hospitalization or recovery may be prolonged and that she may need to take additional medications for a prolonged period. The patient will receive DVT and antibiotic prophylaxis as indicated. She voiced a clear understanding. She had the opportunity to ask questions and informed consent was obtained today. She wishes to proceed.   She does not require preoperative clearance. Her METs are >4.  All preoperative instructions were reviewed. Postoperative expectations were also reviewed. Written handouts were provided to the patient.     A copy of this note was sent to the patient's referring provider.   Hoy Masters, MD Gynecologic Oncology

## 2024-05-30 ENCOUNTER — Encounter (HOSPITAL_COMMUNITY)

## 2024-05-30 ENCOUNTER — Telehealth: Payer: Self-pay | Admitting: *Deleted

## 2024-05-30 ENCOUNTER — Other Ambulatory Visit (HOSPITAL_COMMUNITY): Payer: Self-pay

## 2024-05-30 NOTE — Anesthesia Preprocedure Evaluation (Addendum)
 Anesthesia Evaluation    Reviewed: Allergy & Precautions, [name] Chart, lab work & pertinent test results  Airway Mallampati: II  TM Distance: >3 FB Neck ROM: Full    Dental  (+) Teeth Intact, Dental Advisory Given,    Pulmonary asthma , Current Smoker and Patient abstained from smoking.          Cardiovascular      Neuro/Psych  PSYCHIATRIC DISORDERS Anxiety Depression       GI/Hepatic   Endo/Other    Renal/GU Lab Results      Component                Value               Date                           K                        3.6                 05/24/2024                CO2                      24                  05/24/2024                BUN                      14                  05/24/2024                CREATININE               1.08 (H)            05/24/2024                GFRNONAA                 >60                 05/24/2024                CALCIUM                   8.0 (L)             05/24/2024                    ALBUMIN                  2.3 (L)             05/24/2024                         Musculoskeletal  (+) Arthritis ,    Abdominal   Peds  Hematology  (+) HIVLab Results      Component                Value               Date  WBC                      1.5 (L)             05/24/2024                HGB                      8.5 (L)             05/24/2024                HCT                      28.5 (L)            05/24/2024                MCV                      86.4                05/24/2024                PLT                      296                 05/24/2024              Anesthesia Other Findings All: Morphine , latex  High grade cervical dysplasia  Reproductive/Obstetrics                              Anesthesia Physical Anesthesia Plan  ASA: 3  Anesthesia Plan: General   Post-op Pain Management:    Induction: Intravenous  PONV Risk Score  and Plan: Treatment may vary due to age or medical condition, Midazolam , Ondansetron  and Dexamethasone   Airway Management Planned: LMA  Additional Equipment: None  Intra-op Plan:   Post-operative Plan: Extubation in OR  Informed Consent:      Dental advisory given  Plan Discussed with:   Anesthesia Plan Comments: (PMH of HIV/AIDs. Last CD4 count <35. Followed by ID. Labs notable for WBC 1.6. No recent infections.)         Anesthesia Quick Evaluation

## 2024-05-30 NOTE — Telephone Encounter (Signed)
 Telephone call to check on pre-operative status.  Patient compliant with pre-operative instructions.  Reinforced nothing to eat after midnight. Clear liquids until 12:25 pm. Patient to arrive at 1:25 pm.  No questions or concerns voiced.  Instructed to call for any needs.

## 2024-05-31 ENCOUNTER — Encounter (HOSPITAL_COMMUNITY): Payer: Self-pay | Admitting: Medical

## 2024-05-31 ENCOUNTER — Ambulatory Visit (HOSPITAL_COMMUNITY)
Admission: RE | Admit: 2024-05-31 | Discharge: 2024-05-31 | Disposition: A | Attending: Obstetrics & Gynecology | Admitting: Obstetrics & Gynecology

## 2024-05-31 ENCOUNTER — Ambulatory Visit (HOSPITAL_COMMUNITY): Payer: Self-pay | Admitting: Medical

## 2024-05-31 ENCOUNTER — Other Ambulatory Visit: Payer: Self-pay

## 2024-05-31 ENCOUNTER — Encounter (HOSPITAL_COMMUNITY): Payer: Self-pay | Admitting: Obstetrics & Gynecology

## 2024-05-31 ENCOUNTER — Encounter: Admission: RE | Disposition: A | Payer: Self-pay | Attending: Obstetrics & Gynecology

## 2024-05-31 DIAGNOSIS — J45909 Unspecified asthma, uncomplicated: Secondary | ICD-10-CM | POA: Diagnosis not present

## 2024-05-31 DIAGNOSIS — F418 Other specified anxiety disorders: Secondary | ICD-10-CM | POA: Diagnosis not present

## 2024-05-31 DIAGNOSIS — N909 Noninflammatory disorder of vulva and perineum, unspecified: Secondary | ICD-10-CM | POA: Diagnosis not present

## 2024-05-31 DIAGNOSIS — D069 Carcinoma in situ of cervix, unspecified: Secondary | ICD-10-CM

## 2024-05-31 DIAGNOSIS — B20 Human immunodeficiency virus [HIV] disease: Secondary | ICD-10-CM | POA: Diagnosis not present

## 2024-05-31 DIAGNOSIS — F17210 Nicotine dependence, cigarettes, uncomplicated: Secondary | ICD-10-CM | POA: Diagnosis not present

## 2024-05-31 DIAGNOSIS — N901 Moderate vulvar dysplasia: Secondary | ICD-10-CM | POA: Diagnosis not present

## 2024-05-31 HISTORY — PX: EXAM UNDER ANESTHESIA, PELVIC: SHX7461

## 2024-05-31 HISTORY — PX: LEEP: SHX91

## 2024-05-31 LAB — POCT PREGNANCY, URINE: Preg Test, Ur: NEGATIVE

## 2024-05-31 SURGERY — EXAM UNDER ANESTHESIA, PELVIC
Anesthesia: General

## 2024-05-31 MED ORDER — ONDANSETRON HCL 4 MG/2ML IJ SOLN: 4.0000 mg | Freq: Once | INTRAMUSCULAR | Status: DC | PRN

## 2024-05-31 MED ORDER — MIDAZOLAM HCL 2 MG/2ML IJ SOLN
INTRAMUSCULAR | Status: AC
  Filled 2024-05-31: qty 2

## 2024-05-31 MED ORDER — PHENYLEPHRINE 80 MCG/ML (10ML) SYRINGE FOR IV PUSH (FOR BLOOD PRESSURE SUPPORT)
PREFILLED_SYRINGE | INTRAVENOUS | Status: DC | PRN
  Administered 2024-05-31: 80 ug via INTRAVENOUS
  Administered 2024-05-31: 120 ug via INTRAVENOUS

## 2024-05-31 MED ORDER — FENTANYL CITRATE (PF) 100 MCG/2ML IJ SOLN
INTRAMUSCULAR | Status: AC
  Filled 2024-05-31: qty 2

## 2024-05-31 MED ORDER — LIDOCAINE HCL (PF) 2 % IJ SOLN
INTRAMUSCULAR | Status: AC
  Filled 2024-05-31: qty 5

## 2024-05-31 MED ORDER — ONDANSETRON HCL 4 MG/2ML IJ SOLN
INTRAMUSCULAR | Status: AC
  Filled 2024-05-31: qty 2

## 2024-05-31 MED ORDER — MIDAZOLAM HCL (PF) 2 MG/2ML IJ SOLN
INTRAMUSCULAR | Status: DC | PRN
  Administered 2024-05-31: 2 mg via INTRAVENOUS

## 2024-05-31 MED ORDER — OXYCODONE HCL 5 MG/5ML PO SOLN: 5.0000 mg | Freq: Once | ORAL | Status: AC | PRN

## 2024-05-31 MED ORDER — KETOROLAC TROMETHAMINE 30 MG/ML IJ SOLN
INTRAMUSCULAR | Status: AC
  Filled 2024-05-31: qty 1

## 2024-05-31 MED ORDER — KETOROLAC TROMETHAMINE 30 MG/ML IJ SOLN: 30.0000 mg | Freq: Once | INTRAMUSCULAR | Status: DC | PRN

## 2024-05-31 MED ORDER — ORAL CARE MOUTH RINSE: 15.0000 mL | Freq: Once | OROMUCOSAL | Status: AC

## 2024-05-31 MED ORDER — ACETIC ACID 5 % SOLN
Status: AC
  Filled 2024-05-31: qty 12

## 2024-05-31 MED ORDER — SCOPOLAMINE 1 MG/3DAYS TD PT72
1.0000 | MEDICATED_PATCH | TRANSDERMAL | Status: DC
  Administered 2024-05-31: 1 mg via TRANSDERMAL
  Filled 2024-05-31: qty 1

## 2024-05-31 MED ORDER — ONDANSETRON HCL 4 MG/2ML IJ SOLN
INTRAMUSCULAR | Status: DC | PRN
  Administered 2024-05-31: 4 mg via INTRAVENOUS

## 2024-05-31 MED ORDER — KETOROLAC TROMETHAMINE 30 MG/ML IJ SOLN
INTRAMUSCULAR | Status: DC | PRN
  Administered 2024-05-31: 30 mg via INTRAVENOUS

## 2024-05-31 MED ORDER — LIDOCAINE HCL (CARDIAC) PF 100 MG/5ML IV SOSY
PREFILLED_SYRINGE | INTRAVENOUS | Status: DC | PRN
  Administered 2024-05-31: 100 mg via INTRATRACHEAL

## 2024-05-31 MED ORDER — ACETIC ACID 4% SOLUTION
Status: DC | PRN
  Administered 2024-05-31: 1 via TOPICAL

## 2024-05-31 MED ORDER — STERILE WATER FOR IRRIGATION IR SOLN
Status: DC | PRN
  Administered 2024-05-31: 1000 mL

## 2024-05-31 MED ORDER — HYDROMORPHONE HCL 1 MG/ML IJ SOLN
0.2500 mg | INTRAMUSCULAR | Status: DC | PRN
  Administered 2024-05-31 (×2): 0.5 mg via INTRAVENOUS

## 2024-05-31 MED ORDER — GELATIN ABSORBABLE 100 EX MISC
CUTANEOUS | Status: DC | PRN
  Administered 2024-05-31: 1

## 2024-05-31 MED ORDER — OXYCODONE HCL 5 MG PO TABS
5.0000 mg | ORAL_TABLET | Freq: Once | ORAL | Status: AC | PRN
  Administered 2024-05-31: 5 mg via ORAL

## 2024-05-31 MED ORDER — ACETAMINOPHEN 500 MG PO TABS
1000.0000 mg | ORAL_TABLET | Freq: Once | ORAL | Status: AC
  Administered 2024-05-31: 1000 mg via ORAL
  Filled 2024-05-31: qty 2

## 2024-05-31 MED ORDER — PHENYLEPHRINE 80 MCG/ML (10ML) SYRINGE FOR IV PUSH (FOR BLOOD PRESSURE SUPPORT)
PREFILLED_SYRINGE | INTRAVENOUS | Status: AC
  Filled 2024-05-31: qty 10

## 2024-05-31 MED ORDER — PROPOFOL 10 MG/ML IV BOLUS
INTRAVENOUS | Status: DC | PRN
  Administered 2024-05-31: 150 mg via INTRAVENOUS

## 2024-05-31 MED ORDER — ACETAMINOPHEN 500 MG PO TABS: 1000.0000 mg | ORAL_TABLET | ORAL | Status: DC

## 2024-05-31 MED ORDER — DEXAMETHASONE SODIUM PHOSPHATE 4 MG/ML IJ SOLN
INTRAMUSCULAR | Status: DC | PRN
  Administered 2024-05-31: 5 mg via INTRAVENOUS

## 2024-05-31 MED ORDER — IODINE STRONG (LUGOLS) 5 % PO SOLN
ORAL | Status: AC
  Filled 2024-05-31: qty 1

## 2024-05-31 MED ORDER — MONSELS FERRIC SUBSULFATE EX SOLN
CUTANEOUS | Status: AC
  Filled 2024-05-31: qty 8

## 2024-05-31 MED ORDER — OXYCODONE HCL 5 MG PO TABS
ORAL_TABLET | ORAL | Status: AC
  Filled 2024-05-31: qty 1

## 2024-05-31 MED ORDER — FENTANYL CITRATE (PF) 100 MCG/2ML IJ SOLN
INTRAMUSCULAR | Status: DC | PRN
  Administered 2024-05-31 (×4): 50 ug via INTRAVENOUS

## 2024-05-31 MED ORDER — LIDOCAINE-EPINEPHRINE 1 %-1:100000 IJ SOLN
INTRAMUSCULAR | Status: DC | PRN
  Administered 2024-05-31: 30 mL

## 2024-05-31 MED ORDER — SILVER NITRATE-POT NITRATE 75-25 % EX MISC
CUTANEOUS | Status: AC
  Filled 2024-05-31: qty 10

## 2024-05-31 MED ORDER — HYDROMORPHONE HCL 1 MG/ML IJ SOLN
INTRAMUSCULAR | Status: AC
  Filled 2024-05-31: qty 1

## 2024-05-31 MED ORDER — LIDOCAINE-EPINEPHRINE 1 %-1:100000 IJ SOLN
INTRAMUSCULAR | Status: AC
  Filled 2024-05-31: qty 1

## 2024-05-31 MED ORDER — LACTATED RINGERS IV SOLN: INTRAVENOUS | Status: DC

## 2024-05-31 MED ORDER — DEXAMETHASONE SOD PHOSPHATE PF 10 MG/ML IJ SOLN: 4.0000 mg | INTRAMUSCULAR | Status: DC

## 2024-05-31 MED ORDER — CHLORHEXIDINE GLUCONATE 0.12 % MT SOLN
15.0000 mL | Freq: Once | OROMUCOSAL | Status: AC
  Administered 2024-05-31: 15 mL via OROMUCOSAL

## 2024-05-31 SURGICAL SUPPLY — 20 items
APPLICATOR COTTON TIP 6 STRL (MISCELLANEOUS) IMPLANT
BAG COUNTER SPONGE SURGICOUNT (BAG) IMPLANT
CLIP APPLIE 32.77 55 M/L DVNC (INSTRUMENTS) IMPLANT
CLOTH BEACON ORANGE TIMEOUT ST (SAFETY) ×2 IMPLANT
COVER SURGICAL LIGHT HANDLE (MISCELLANEOUS) ×2 IMPLANT
DRAPE HYSTEROSCOPY (MISCELLANEOUS) IMPLANT
DRSG TELFA 3X8 NADH STRL (GAUZE/BANDAGES/DRESSINGS) IMPLANT
ELECTRODE LOOP LP RND 15X12GRN (CUTTING LOOP) IMPLANT
GLOVE BIO SURGEON STRL SZ 6.5 (GLOVE) ×2 IMPLANT
GOWN STRL REUS W/ TWL LRG LVL3 (GOWN DISPOSABLE) ×4 IMPLANT
HIBICLENS CHG 4% 4OZ BTL (MISCELLANEOUS) ×2 IMPLANT
KIT BASIN OR (CUSTOM PROCEDURE TRAY) IMPLANT
KIT TURNOVER KIT A (KITS) ×2 IMPLANT
NS IRRIG 1000ML POUR BTL (IV SOLUTION) ×2 IMPLANT
PENCIL SMOKE EVACUATOR (MISCELLANEOUS) IMPLANT
SCOPETTES 8 STERILE (MISCELLANEOUS) IMPLANT
SCRUB CHG 4% DYNA-HEX 4OZ (MISCELLANEOUS) ×2 IMPLANT
SOL PREP POV-IOD 4OZ 10% (MISCELLANEOUS) IMPLANT
SYR BULB IRRIG 60ML STRL (SYRINGE) IMPLANT
TOWEL OR DSP ST BLU DLX 10/PK (DISPOSABLE) ×4 IMPLANT

## 2024-05-31 NOTE — Interval H&P Note (Signed)
 History and Physical Interval Note:  05/31/2024 2:01 PM  Beverly [name]  has presented today for surgery, with the diagnosis of N87.9, N90.3.  The various methods of treatment have been discussed with the patient and family. After consideration of risks, benefits and other options for treatment, the patient has consented to  Procedure(s): EXAM UNDER ANESTHESIA, PELVIC (N/A), colposcopy with Loop Electrosurgical Excisional Procedure and vulva biopsies.  The patient's history has been reviewed, patient examined, no change in status, stable for surgery.  I have reviewed the patient's chart and labs.  Questions were answered to the patient's satisfaction.     Olam Mill

## 2024-05-31 NOTE — Anesthesia Postprocedure Evaluation (Signed)
 Anesthesia Post Note  Patient: Beverly [name]  Procedure(s) Performed: EXAM UNDER ANESTHESIA, PELVIC LEEP (LOOP ELECTROSURGICAL EXCISION PROCEDURE)     Patient location during evaluation: PACU Anesthesia Type: General Level of consciousness: awake and alert Pain management: pain level controlled Vital Signs Assessment: post-procedure vital signs reviewed and stable Respiratory status: spontaneous breathing, nonlabored ventilation, respiratory function stable and patient connected to nasal cannula oxygen Cardiovascular status: blood pressure returned to baseline and stable Postop Assessment: no apparent nausea or vomiting Anesthetic complications: no   No notable events documented.  Last Vitals:  Vitals:   05/31/24 1630 05/31/24 1645  BP: 102/63 95/64  Pulse: 85 83  Resp: (!) 23 16  Temp:    SpO2: 98% 98%    Last Pain:  Vitals:   05/31/24 1645  TempSrc:   PainSc: 0-No pain                 Garnette [name]

## 2024-05-31 NOTE — Op Note (Signed)
 Preoperative diagnosis: HSIL Pap test, vulva lesions  Postoperative diagnosis: Same  Procedure: Colposcopy, Loop Electrosurgical Excisional   Procedure, vulva biopsies  Surgeon: Olam Mill, MD  Anesthesia:general  Estimated blood loss: Minimal  Urine output: None  IV Fluids: per Anesthesiology  Complications: None  Specimen:  ID Type Source Tests Collected by Time Destination  1 : left labial gluteal fold Tissue PATH Gyn biopsy SURGICAL PATHOLOGY Mill Olam, MD 05/31/2024 1454   2 : Mons biopsy at 12 o'clock Tissue PATH Gyn biopsy SURGICAL PATHOLOGY Mill Olam, MD 05/31/2024 1456   3 : right labia majora biopsy at 10 o'clock Tissue PATH Gyn biopsy SURGICAL PATHOLOGY Mill Olam, MD 05/31/2024 1459   4 : cervical cone, suture marks 12 O'clock Tissue PATH Gyn biopsy SURGICAL PATHOLOGY Mill Olam, MD 05/31/2024 1514   5 : endocervical curettings Tissue PATH Gyn biopsy SURGICAL PATHOLOGY Mill Olam, MD 05/31/2024 1516      Operative Findings: White plaques involving the mons, peri clitoral area and right labia majora.  Hypopigmented patches involving the left labia/thigh fold.  The colposcopic exam was satisfactory.  An acetowhite area at 12 o'clock with discrete borders and course punctations was seen.    Description of procedure:   The patient was taken to the operating room and placed on the operating table in the semi-lithotomy position in [place] stirrups.  Examination under anesthesia was performed.  The patient was prepped and draped in the usual manner.  After a time-out had been completed, the above lesions were infiltrated with local anesthesia with 1% Lidocaine  with dilute epinephrine .  Multiple 3 mm punch biopsies were performed and sent for pathologic examination.  Silver  Nitrate was applied to the resulting ellipses.  Adequate hemostasis was noted.   An insulated speculum was placed within the vagina and the cervix was  well visualized.  The cervix was then observed colposcopically by placement of acetic acid .  The colposcopic findings are noted above. 1% lidocaine  with dilute epinephrine  was then used to infiltrated the margins of the exocervix and blanching was noted.  The loop electrode was then selected and used to excise the transformation zone including the abnormal appearing areas.  This specimen was tagged with a suture at 12 o'clock and then sent for pathologic evaluation.  The bed of the excision site was then fulgurated with the ball electrode. A pledget of gelfoam was placed in the cone bed.  All the instruments were removed from the vagina.  Final instrument counts were correct.  An RFID scan was negative for retained sponges.  The patient was taken to the PACU in stable condition.

## 2024-05-31 NOTE — Anesthesia Procedure Notes (Signed)
 Procedure Name: LMA Insertion Date/Time: 05/31/2024 2:37 PM  Performed by: Judythe Tanda Aran, CRNAPre-anesthesia Checklist: Emergency Drugs available, Patient identified, Suction available and Patient being monitored Patient Re-evaluated:Patient Re-evaluated prior to induction Oxygen Delivery Method: Circle system utilized Preoxygenation: Pre-oxygenation with 100% oxygen Induction Type: IV induction Ventilation: Mask ventilation without difficulty LMA: LMA inserted LMA Size: 4.0 Number of attempts: 1 Placement Confirmation: positive ETCO2 and breath sounds checked- equal and bilateral Tube secured with: Tape Dental Injury: Teeth and Oropharynx as per pre-operative assessment

## 2024-05-31 NOTE — Discharge Instructions (Addendum)
 We recommend calling Dr. Yvonne office about incision from surgery on 12/3. You should call him to make him aware of increased drainage from incision and request follow up visit.    Medications:  - Take ibuprofen  and tylenol  first line for pain control. Take these regularly (every 6 hours) to decrease the build up of pain.  Wound Care: 1. Keep clean and dry.  Apply petroleum jelly twice/day for 3 days. Reasons to call the Doctor:  Fever - Oral temperature greater than 100.4 degrees Fahrenheit Foul-smelling vaginal discharge Difficulty urinating Nausea and vomiting Increased pain at the site of the incision that is unrelieved with pain medicine. Difficulty breathing with or without chest pain New calf pain especially if only on one side Sudden, continuing increased vaginal bleeding with or without clots.   Follow-up: 1. See Olam Mill in 4weeks. You will have a phone visit once pathology is back.  Contacts: For questions or concerns you should contact:  Dr. Olam Mill at [phone] After hours and on week-ends call [phone] and ask to speak to the physician on call for Gynecologic Oncology

## 2024-05-31 NOTE — Transfer of Care (Signed)
 Immediate Anesthesia Transfer of Care Note  Patient: Beverly [name]  Procedure(s) Performed: EXAM UNDER ANESTHESIA, PELVIC LEEP (LOOP ELECTROSURGICAL EXCISION PROCEDURE)  Patient Location: PACU  Anesthesia Type:General  Level of Consciousness: awake  Airway & Oxygen Therapy: Patient Spontanous Breathing  Post-op Assessment: Report given to RN and Post -op Vital signs reviewed and stable  Post vital signs: Reviewed and stable  Last Vitals:  Vitals Value Taken Time  BP 118/56 05/31/24 15:35  Temp 36.4 C 05/31/24 15:35  Pulse 102 05/31/24 15:39  Resp 16 05/31/24 15:39  SpO2 100 % 05/31/24 15:39  Vitals shown include unfiled device data.  Last Pain:  Vitals:   05/31/24 1358  TempSrc:   PainSc: 7          Complications: No notable events documented.

## 2024-06-01 ENCOUNTER — Encounter (HOSPITAL_COMMUNITY): Payer: Self-pay | Admitting: Obstetrics & Gynecology

## 2024-06-01 ENCOUNTER — Telehealth: Payer: Self-pay | Admitting: *Deleted

## 2024-06-01 NOTE — Telephone Encounter (Signed)
 Spoke with Beverly [name] this morning. She states she is eating, drinking and urinating well. She has not had a BM yet but is passing gas.  Encouraged her to drink plenty of water. She denies fever or chills. She rates her discomfort 5/10. She hasn't needed anything for pain.   Instructed to call office with any fever, chills, purulent drainage, uncontrolled pain or any other questions or concerns. Patient verbalizes understanding.   Pt aware of post op appointments as well as the office number [phone] and after hours number [phone] to call if she has any questions or concerns

## 2024-06-02 LAB — SURGICAL PATHOLOGY

## 2024-06-05 ENCOUNTER — Ambulatory Visit: Payer: Self-pay | Admitting: Surgery

## 2024-06-05 ENCOUNTER — Other Ambulatory Visit: Payer: Self-pay

## 2024-06-05 ENCOUNTER — Inpatient Hospital Stay (HOSPITAL_COMMUNITY)
Admission: AD | Admit: 2024-06-05 | Discharge: 2024-06-27 | DRG: 856 | Disposition: A | Discharge status: Home / Self Care | Source: Ambulatory Visit | Attending: Internal Medicine | Admitting: Internal Medicine

## 2024-06-05 ENCOUNTER — Inpatient Hospital Stay (HOSPITAL_COMMUNITY): Admitting: Certified Registered Nurse Anesthetist

## 2024-06-05 ENCOUNTER — Encounter (HOSPITAL_COMMUNITY): Admission: AD | Disposition: A | Discharge status: Home / Self Care | Payer: Self-pay | Source: Ambulatory Visit

## 2024-06-05 ENCOUNTER — Encounter (HOSPITAL_COMMUNITY): Payer: Self-pay | Admitting: Surgery

## 2024-06-05 DIAGNOSIS — Z808 Family history of malignant neoplasm of other organs or systems: Secondary | ICD-10-CM

## 2024-06-05 DIAGNOSIS — Z5989 Other problems related to housing and economic circumstances: Secondary | ICD-10-CM

## 2024-06-05 DIAGNOSIS — B9689 Other specified bacterial agents as the cause of diseases classified elsewhere: Secondary | ICD-10-CM | POA: Diagnosis present

## 2024-06-05 DIAGNOSIS — J45909 Unspecified asthma, uncomplicated: Secondary | ICD-10-CM | POA: Diagnosis present

## 2024-06-05 DIAGNOSIS — B370 Candidal stomatitis: Secondary | ICD-10-CM

## 2024-06-05 DIAGNOSIS — I959 Hypotension, unspecified: Secondary | ICD-10-CM | POA: Diagnosis not present

## 2024-06-05 DIAGNOSIS — K219 Gastro-esophageal reflux disease without esophagitis: Secondary | ICD-10-CM | POA: Diagnosis present

## 2024-06-05 DIAGNOSIS — Z79899 Other long term (current) drug therapy: Secondary | ICD-10-CM

## 2024-06-05 DIAGNOSIS — E861 Hypovolemia: Secondary | ICD-10-CM | POA: Diagnosis not present

## 2024-06-05 DIAGNOSIS — B079 Viral wart, unspecified: Secondary | ICD-10-CM | POA: Diagnosis present

## 2024-06-05 DIAGNOSIS — I809 Phlebitis and thrombophlebitis of unspecified site: Secondary | ICD-10-CM

## 2024-06-05 DIAGNOSIS — F17210 Nicotine dependence, cigarettes, uncomplicated: Secondary | ICD-10-CM

## 2024-06-05 DIAGNOSIS — A419 Sepsis, unspecified organism: Secondary | ICD-10-CM | POA: Diagnosis not present

## 2024-06-05 DIAGNOSIS — Z8701 Personal history of pneumonia (recurrent): Secondary | ICD-10-CM

## 2024-06-05 DIAGNOSIS — E876 Hypokalemia: Secondary | ICD-10-CM | POA: Diagnosis present

## 2024-06-05 DIAGNOSIS — Z5941 Food insecurity: Secondary | ICD-10-CM

## 2024-06-05 DIAGNOSIS — Z85828 Personal history of other malignant neoplasm of skin: Secondary | ICD-10-CM

## 2024-06-05 DIAGNOSIS — Z885 Allergy status to narcotic agent status: Secondary | ICD-10-CM

## 2024-06-05 DIAGNOSIS — Z221 Carrier of other intestinal infectious diseases: Secondary | ICD-10-CM

## 2024-06-05 DIAGNOSIS — Z8589 Personal history of malignant neoplasm of other organs and systems: Secondary | ICD-10-CM

## 2024-06-05 DIAGNOSIS — R8271 Bacteriuria: Secondary | ICD-10-CM | POA: Diagnosis present

## 2024-06-05 DIAGNOSIS — Z825 Family history of asthma and other chronic lower respiratory diseases: Secondary | ICD-10-CM

## 2024-06-05 DIAGNOSIS — K3189 Other diseases of stomach and duodenum: Secondary | ICD-10-CM | POA: Diagnosis present

## 2024-06-05 DIAGNOSIS — Z833 Family history of diabetes mellitus: Secondary | ICD-10-CM

## 2024-06-05 DIAGNOSIS — Z87410 Personal history of cervical dysplasia: Secondary | ICD-10-CM

## 2024-06-05 DIAGNOSIS — Z8619 Personal history of other infectious and parasitic diseases: Secondary | ICD-10-CM

## 2024-06-05 DIAGNOSIS — F32A Depression, unspecified: Secondary | ICD-10-CM | POA: Diagnosis present

## 2024-06-05 DIAGNOSIS — I808 Phlebitis and thrombophlebitis of other sites: Secondary | ICD-10-CM | POA: Diagnosis not present

## 2024-06-05 DIAGNOSIS — G6289 Other specified polyneuropathies: Secondary | ICD-10-CM | POA: Diagnosis present

## 2024-06-05 DIAGNOSIS — T8141XA Infection following a procedure, superficial incisional surgical site, initial encounter: Principal | ICD-10-CM | POA: Diagnosis present

## 2024-06-05 DIAGNOSIS — B20 Human immunodeficiency virus [HIV] disease: Secondary | ICD-10-CM | POA: Diagnosis present

## 2024-06-05 DIAGNOSIS — F418 Other specified anxiety disorders: Secondary | ICD-10-CM | POA: Diagnosis not present

## 2024-06-05 DIAGNOSIS — R509 Fever, unspecified: Secondary | ICD-10-CM

## 2024-06-05 DIAGNOSIS — B9562 Methicillin resistant Staphylococcus aureus infection as the cause of diseases classified elsewhere: Secondary | ICD-10-CM | POA: Diagnosis present

## 2024-06-05 DIAGNOSIS — E871 Hypo-osmolality and hyponatremia: Secondary | ICD-10-CM | POA: Diagnosis present

## 2024-06-05 DIAGNOSIS — T8149XA Infection following a procedure, other surgical site, initial encounter: Principal | ICD-10-CM | POA: Diagnosis present

## 2024-06-05 DIAGNOSIS — D6489 Other specified anemias: Secondary | ICD-10-CM | POA: Diagnosis present

## 2024-06-05 DIAGNOSIS — Z888 Allergy status to other drugs, medicaments and biological substances status: Secondary | ICD-10-CM

## 2024-06-05 DIAGNOSIS — D709 Neutropenia, unspecified: Secondary | ICD-10-CM | POA: Diagnosis present

## 2024-06-05 DIAGNOSIS — L98499 Non-pressure chronic ulcer of skin of other sites with unspecified severity: Secondary | ICD-10-CM | POA: Diagnosis present

## 2024-06-05 DIAGNOSIS — K200 Eosinophilic esophagitis: Secondary | ICD-10-CM | POA: Diagnosis present

## 2024-06-05 DIAGNOSIS — B961 Klebsiella pneumoniae [K. pneumoniae] as the cause of diseases classified elsewhere: Secondary | ICD-10-CM | POA: Diagnosis present

## 2024-06-05 DIAGNOSIS — A0811 Acute gastroenteropathy due to Norwalk agent: Secondary | ICD-10-CM | POA: Diagnosis present

## 2024-06-05 DIAGNOSIS — Z8249 Family history of ischemic heart disease and other diseases of the circulatory system: Secondary | ICD-10-CM

## 2024-06-05 DIAGNOSIS — Z91030 Bee allergy status: Secondary | ICD-10-CM

## 2024-06-05 DIAGNOSIS — Z91040 Latex allergy status: Secondary | ICD-10-CM

## 2024-06-05 DIAGNOSIS — D529 Folate deficiency anemia, unspecified: Secondary | ICD-10-CM | POA: Diagnosis present

## 2024-06-05 DIAGNOSIS — S025XXA Fracture of tooth (traumatic), initial encounter for closed fracture: Secondary | ICD-10-CM | POA: Diagnosis present

## 2024-06-05 DIAGNOSIS — R197 Diarrhea, unspecified: Secondary | ICD-10-CM | POA: Diagnosis present

## 2024-06-05 DIAGNOSIS — S31109A Unspecified open wound of abdominal wall, unspecified quadrant without penetration into peritoneal cavity, initial encounter: Secondary | ICD-10-CM

## 2024-06-05 DIAGNOSIS — B965 Pseudomonas (aeruginosa) (mallei) (pseudomallei) as the cause of diseases classified elsewhere: Secondary | ICD-10-CM | POA: Diagnosis present

## 2024-06-05 DIAGNOSIS — Z87892 Personal history of anaphylaxis: Secondary | ICD-10-CM

## 2024-06-05 DIAGNOSIS — B3781 Candidal esophagitis: Secondary | ICD-10-CM | POA: Diagnosis not present

## 2024-06-05 DIAGNOSIS — D75839 Thrombocytosis, unspecified: Secondary | ICD-10-CM | POA: Diagnosis present

## 2024-06-05 DIAGNOSIS — E860 Dehydration: Secondary | ICD-10-CM | POA: Diagnosis not present

## 2024-06-05 DIAGNOSIS — Y838 Other surgical procedures as the cause of abnormal reaction of the patient, or of later complication, without mention of misadventure at the time of the procedure: Secondary | ICD-10-CM | POA: Diagnosis present

## 2024-06-05 HISTORY — PX: INCISION AND DRAINAGE ABSCESS: SHX5864

## 2024-06-05 MED ORDER — FENTANYL CITRATE (PF) 100 MCG/2ML IJ SOLN: 50.0000 ug | INTRAMUSCULAR | Status: DC | PRN

## 2024-06-05 MED ORDER — VANCOMYCIN HCL IN DEXTROSE 1-5 GM/200ML-% IV SOLN
INTRAVENOUS | Status: AC
  Filled 2024-06-05: qty 200

## 2024-06-05 MED ORDER — ATOVAQUONE 750 MG/5ML PO SUSP
1500.0000 mg | Freq: Every day | ORAL | Status: DC
  Administered 2024-06-06 – 2024-06-27 (×21): 1500 mg via ORAL
  Filled 2024-06-05 (×15): qty 10

## 2024-06-05 MED ORDER — LIDOCAINE HCL (PF) 2 % IJ SOLN
INTRAMUSCULAR | Status: AC
  Filled 2024-06-05: qty 5

## 2024-06-05 MED ORDER — FENTANYL CITRATE (PF) 100 MCG/2ML IJ SOLN
INTRAMUSCULAR | Status: AC
  Filled 2024-06-05: qty 2

## 2024-06-05 MED ORDER — SUCCINYLCHOLINE CHLORIDE 200 MG/10ML IV SOSY
PREFILLED_SYRINGE | INTRAVENOUS | Status: AC
  Filled 2024-06-05: qty 10

## 2024-06-05 MED ORDER — LACTATED RINGERS IV SOLN: INTRAVENOUS | Status: DC

## 2024-06-05 MED ORDER — VANCOMYCIN HCL IN DEXTROSE 1-5 GM/200ML-% IV SOLN
1000.0000 mg | INTRAVENOUS | Status: AC
  Administered 2024-06-05: 17:00:00 1000 mg via INTRAVENOUS

## 2024-06-05 MED ORDER — ACETAMINOPHEN 10 MG/ML IV SOLN: 1000.0000 mg | Freq: Once | INTRAVENOUS | Status: DC | PRN

## 2024-06-05 MED ORDER — ONDANSETRON 4 MG PO TBDP
4.0000 mg | ORAL_TABLET | Freq: Four times a day (QID) | ORAL | Status: DC | PRN
  Administered 2024-06-08 – 2024-06-25 (×7): 4 mg via ORAL
  Filled 2024-06-05 (×5): qty 1

## 2024-06-05 MED ORDER — ONDANSETRON HCL 4 MG/2ML IJ SOLN
INTRAMUSCULAR | Status: AC
  Filled 2024-06-05: qty 2

## 2024-06-05 MED ORDER — DIPHENHYDRAMINE HCL 50 MG/ML IJ SOLN: 12.5000 mg | Freq: Four times a day (QID) | INTRAMUSCULAR | Status: DC | PRN

## 2024-06-05 MED ORDER — OXYCODONE HCL 5 MG/5ML PO SOLN: 5.0000 mg | Freq: Once | ORAL | Status: DC | PRN

## 2024-06-05 MED ORDER — SUCCINYLCHOLINE CHLORIDE 200 MG/10ML IV SOSY
PREFILLED_SYRINGE | INTRAVENOUS | Status: DC | PRN
  Administered 2024-06-05: 19:00:00 60 mg via INTRAVENOUS

## 2024-06-05 MED ORDER — ENOXAPARIN SODIUM 40 MG/0.4ML IJ SOSY
40.0000 mg | PREFILLED_SYRINGE | INTRAMUSCULAR | Status: DC
  Filled 2024-06-05 (×9): qty 0.4

## 2024-06-05 MED ORDER — FENTANYL CITRATE (PF) 50 MCG/ML IJ SOSY
PREFILLED_SYRINGE | INTRAMUSCULAR | Status: AC
  Filled 2024-06-05: qty 1

## 2024-06-05 MED ORDER — CHLORHEXIDINE GLUCONATE CLOTH 2 % EX PADS: 6.0000 | MEDICATED_PAD | Freq: Once | CUTANEOUS | Status: DC

## 2024-06-05 MED ORDER — PHENYLEPHRINE 80 MCG/ML (10ML) SYRINGE FOR IV PUSH (FOR BLOOD PRESSURE SUPPORT)
PREFILLED_SYRINGE | INTRAVENOUS | Status: AC
  Filled 2024-06-05: qty 10

## 2024-06-05 MED ORDER — PROPOFOL 10 MG/ML IV BOLUS
INTRAVENOUS | Status: AC
  Filled 2024-06-05: qty 20

## 2024-06-05 MED ORDER — FENTANYL CITRATE (PF) 50 MCG/ML IJ SOSY
PREFILLED_SYRINGE | INTRAMUSCULAR | Status: AC
  Filled 2024-06-05: qty 2

## 2024-06-05 MED ORDER — OXYCODONE HCL 5 MG PO TABS: 5.0000 mg | ORAL_TABLET | Freq: Once | ORAL | Status: DC | PRN

## 2024-06-05 MED ORDER — MIDAZOLAM HCL 5 MG/5ML IJ SOLN
INTRAMUSCULAR | Status: DC | PRN
  Administered 2024-06-05: 19:00:00 2 mg via INTRAVENOUS

## 2024-06-05 MED ORDER — CHLORHEXIDINE GLUCONATE 0.12 % MT SOLN
15.0000 mL | Freq: Once | OROMUCOSAL | Status: AC
  Administered 2024-06-05: 16:00:00 15 mL via OROMUCOSAL

## 2024-06-05 MED ORDER — HYDROMORPHONE HCL 1 MG/ML IJ SOLN: 0.5000 mg | INTRAMUSCULAR | Status: DC | PRN

## 2024-06-05 MED ORDER — DEXAMETHASONE SODIUM PHOSPHATE 4 MG/ML IJ SOLN
INTRAMUSCULAR | Status: DC | PRN
  Administered 2024-06-05: 19:00:00 5 mg via INTRAVENOUS

## 2024-06-05 MED ORDER — OXYCODONE HCL 5 MG PO TABS
5.0000 mg | ORAL_TABLET | ORAL | Status: DC | PRN
  Administered 2024-06-05 – 2024-06-08 (×7): 10 mg via ORAL
  Filled 2024-06-05 (×7): qty 2

## 2024-06-05 MED ORDER — BICTEGRAVIR-EMTRICITAB-TENOFOV 50-200-25 MG PO TABS
1.0000 | ORAL_TABLET | Freq: Every day | ORAL | Status: DC
  Administered 2024-06-06 – 2024-06-27 (×21): 1 via ORAL
  Filled 2024-06-05 (×15): qty 1

## 2024-06-05 MED ORDER — ONDANSETRON HCL 4 MG/2ML IJ SOLN
4.0000 mg | Freq: Four times a day (QID) | INTRAMUSCULAR | Status: DC | PRN
  Administered 2024-06-06 – 2024-06-11 (×3): 4 mg via INTRAVENOUS
  Filled 2024-06-05 (×3): qty 2

## 2024-06-05 MED ORDER — AMISULPRIDE (ANTIEMETIC) 5 MG/2ML IV SOLN: 10.0000 mg | Freq: Once | INTRAVENOUS | Status: DC | PRN

## 2024-06-05 MED ORDER — MIDAZOLAM HCL 2 MG/2ML IJ SOLN
INTRAMUSCULAR | Status: AC
  Filled 2024-06-05: qty 2

## 2024-06-05 MED ORDER — ACETAMINOPHEN 500 MG PO TABS
1000.0000 mg | ORAL_TABLET | Freq: Four times a day (QID) | ORAL | Status: DC
  Administered 2024-06-05 – 2024-06-27 (×62): 1000 mg via ORAL
  Filled 2024-06-05 (×43): qty 2

## 2024-06-05 MED ORDER — FENTANYL CITRATE (PF) 100 MCG/2ML IJ SOLN
INTRAMUSCULAR | Status: DC | PRN
  Administered 2024-06-05 (×2): 50 ug via INTRAVENOUS

## 2024-06-05 MED ORDER — FENTANYL CITRATE (PF) 50 MCG/ML IJ SOSY
50.0000 ug | PREFILLED_SYRINGE | Freq: Once | INTRAMUSCULAR | Status: AC
  Administered 2024-06-05: 18:00:00 50 ug via INTRAVENOUS

## 2024-06-05 MED ORDER — ALBUTEROL SULFATE (2.5 MG/3ML) 0.083% IN NEBU
3.0000 mL | INHALATION_SOLUTION | Freq: Four times a day (QID) | RESPIRATORY_TRACT | Status: DC | PRN
  Administered 2024-06-18: 3 mL via RESPIRATORY_TRACT
  Filled 2024-06-05: qty 3

## 2024-06-05 MED ORDER — METHOCARBAMOL 500 MG PO TABS
500.0000 mg | ORAL_TABLET | Freq: Four times a day (QID) | ORAL | Status: DC
  Administered 2024-06-05 – 2024-06-24 (×61): 500 mg via ORAL
  Filled 2024-06-05 (×48): qty 1

## 2024-06-05 MED ORDER — ONDANSETRON HCL 4 MG/2ML IJ SOLN
INTRAMUSCULAR | Status: DC | PRN
  Administered 2024-06-05: 19:00:00 4 mg via INTRAVENOUS

## 2024-06-05 MED ORDER — 0.9 % SODIUM CHLORIDE (POUR BTL) OPTIME
TOPICAL | Status: DC | PRN
  Administered 2024-06-05: 19:00:00 1000 mL

## 2024-06-05 MED ORDER — PANTOPRAZOLE SODIUM 40 MG PO TBEC
40.0000 mg | DELAYED_RELEASE_TABLET | Freq: Every day | ORAL | Status: DC
  Administered 2024-06-06 – 2024-06-27 (×21): 40 mg via ORAL
  Filled 2024-06-05 (×14): qty 1

## 2024-06-05 MED ORDER — PROPOFOL 10 MG/ML IV BOLUS
INTRAVENOUS | Status: DC | PRN
  Administered 2024-06-05: 19:00:00 200 mg via INTRAVENOUS

## 2024-06-05 MED ORDER — LIDOCAINE HCL (CARDIAC) PF 100 MG/5ML IV SOSY
PREFILLED_SYRINGE | INTRAVENOUS | Status: DC | PRN
  Administered 2024-06-05: 19:00:00 60 mg via INTRATRACHEAL

## 2024-06-05 MED ORDER — FENTANYL CITRATE (PF) 50 MCG/ML IJ SOSY
25.0000 ug | PREFILLED_SYRINGE | INTRAMUSCULAR | Status: DC | PRN
  Administered 2024-06-05 (×3): 50 ug via INTRAVENOUS

## 2024-06-05 MED ORDER — PREGABALIN 25 MG PO CAPS
25.0000 mg | ORAL_CAPSULE | Freq: Two times a day (BID) | ORAL | Status: DC
  Administered 2024-06-05 – 2024-06-21 (×30): 25 mg via ORAL
  Filled 2024-06-05 (×27): qty 1

## 2024-06-05 MED ORDER — DOCUSATE SODIUM 100 MG PO CAPS
100.0000 mg | ORAL_CAPSULE | Freq: Two times a day (BID) | ORAL | Status: DC
  Administered 2024-06-05 – 2024-06-08 (×6): 100 mg via ORAL
  Filled 2024-06-05 (×9): qty 1

## 2024-06-05 MED ORDER — MELATONIN 3 MG PO TABS
3.0000 mg | ORAL_TABLET | Freq: Every evening | ORAL | Status: DC | PRN
  Administered 2024-06-05 – 2024-06-26 (×16): 3 mg via ORAL
  Filled 2024-06-05 (×12): qty 1

## 2024-06-05 MED ORDER — DIPHENHYDRAMINE HCL 12.5 MG/5ML PO ELIX: 12.5000 mg | ORAL_SOLUTION | Freq: Four times a day (QID) | ORAL | Status: DC | PRN

## 2024-06-05 MED ORDER — PHENYLEPHRINE 80 MCG/ML (10ML) SYRINGE FOR IV PUSH (FOR BLOOD PRESSURE SUPPORT)
PREFILLED_SYRINGE | INTRAVENOUS | Status: DC | PRN
  Administered 2024-06-05: 19:00:00 120 ug via INTRAVENOUS
  Administered 2024-06-05: 19:00:00 160 ug via INTRAVENOUS

## 2024-06-05 NOTE — Anesthesia Procedure Notes (Signed)
 Procedure Name: Intubation Date/Time: 06/05/2024 6:50 PM  Performed by: Judythe Tanda Aran, CRNAPre-anesthesia Checklist: Patient identified, Emergency Drugs available, Suction available and Patient being monitored Patient Re-evaluated:Patient Re-evaluated prior to induction Oxygen Delivery Method: Circle system utilized Preoxygenation: Pre-oxygenation with 100% oxygen Induction Type: IV induction Ventilation: Mask ventilation without difficulty Laryngoscope Size: 2 and Miller Grade View: Grade I Tube type: Oral Tube size: 7.0 mm Number of attempts: 1 Airway Equipment and Method: Stylet Placement Confirmation: ETT inserted through vocal cords under direct vision, positive ETCO2 and breath sounds checked- equal and bilateral Secured at: 20 cm Tube secured with: Tape Dental Injury: Teeth and Oropharynx as per pre-operative assessment

## 2024-06-05 NOTE — Anesthesia Postprocedure Evaluation (Signed)
 Anesthesia Post Note  Patient: Beverly [name]  Procedure(s) Performed: INCISION AND DRAINAGE OF ABDOMINAL WALL ASBCESS (Bilateral: Abdomen)     Patient location during evaluation: PACU Anesthesia Type: General Level of consciousness: awake Pain management: pain level controlled Vital Signs Assessment: post-procedure vital signs reviewed and stable Respiratory status: spontaneous breathing, nonlabored ventilation and respiratory function stable Cardiovascular status: blood pressure returned to baseline and stable Postop Assessment: no apparent nausea or vomiting Anesthetic complications: no   No notable events documented.  Last Vitals:  Vitals:   06/05/24 2015 06/05/24 2042  BP: 109/71 118/73  Pulse: (!) 104 (!) 101  Resp: (!) 22 16  Temp:  36.8 C  SpO2: 97% 100%    Last Pain:  Vitals:   06/05/24 2042  TempSrc: Oral  PainSc:                  Bernardino SQUIBB Francy Mcilvaine

## 2024-06-05 NOTE — H&P (Signed)
 Beverly [name] is an 43 y.o. female.   Chief Complaint: wound drainage HPI: Beverly [name] is a 43 yo female who underwent 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  She presented to the office today with increased pain and persistent drainage from her wound, which has been going on for several days. She appeared to have a wound infection in the office and was brought to the OR for surgical debridement.   Past Medical History:  Diagnosis Date   Abscess    AIDS (HCC) 03/19/2015   Anemia    Anxiety    Arthritis    Asthma    Blood transfusion without reported diagnosis    Depression    HIV infection (HCC)    Homeless 03/19/2015   states stays with family or friends. Does not rent or own a home, but does not live on streets.   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   Pneumonia    Skin ulcer (HCC) 05/06/2015    Past Surgical History:  Procedure Laterality Date   BIOPSY OF SKIN SUBCUTANEOUS TISSUE AND/OR MUCOUS MEMBRANE N/A 02/14/2024   Procedure: Incisional Biopsy of Abdominal Wall Wound;  Surgeon: Curvin Deward MOULD, MD;  Location: [facility];  Service: General;  Laterality: N/A;   CERVICAL CONIZATION W/BX N/A 04/14/2018   Procedure: COLD KNIFE CONIZATION CERVIX WITH BIOPSY;  Surgeon: Anitra Freddy NOVAK, MD;  Location: [facility];  Service: Gynecology;  Laterality: N/A;   CESAREAN SECTION     DILATION AND CURETTAGE OF UTERUS N/A 04/14/2018   Procedure: ENDOCERVICAL CURETTAGE;  Surgeon: Anitra Freddy NOVAK, MD;  Location: [facility];  Service: Gynecology;  Laterality: N/A;   EXAM UNDER ANESTHESIA, PELVIC N/A 05/31/2024   Procedure: EXAM UNDER ANESTHESIA, PELVIC;  Surgeon: Rogelio Planas, MD;  Location: [facility];  Service: Gynecology;  Laterality: N/A;   EXCISION MASS ABDOMINAL N/A 05/24/2024   Procedure: EXCISION, MASS, TORSO;   Surgeon: Curvin Deward MOULD, MD;  Location: [facility];  Service: General;  Laterality: N/A;  EXCISION SQUAMOUS ELL CANCER LOWER ABDOMINAL WALL   INCISION AND DRAINAGE ABSCESS Right 01/10/2016   Procedure: INCISION AND DRAINAGE RIGHT BUTTOCK, LEFT LABIAL , EXCISION AND DRAINAGE OF  RIGHT AXILLARY ABSCESS;  Surgeon: Morene Olives, MD;  Location: [facility];  Service: General;  Laterality: Right;   LEEP  05/31/2024   Procedure: LEEP (LOOP ELECTROSURGICAL EXCISION PROCEDURE);  Surgeon: Rogelio Planas, MD;  Location: [facility];  Service: Gynecology;;   TUBAL LIGATION  2005   VULVA MARYBETH BIOPSY N/A 04/14/2018   Procedure: VULVAR BIOPSIES;  Surgeon: Anitra Freddy NOVAK, MD;  Location: [facility];  Service: Gynecology;  Laterality: N/A;    Family History  Problem Relation Age of Onset   Throat cancer Mother        smoker   Throat cancer Father        smoker   Hypertension Father    Cirrhosis Father    Hypertension Other    Cancer Other        smoker   Diabetes Other    Asthma Other    Colon cancer Neg Hx    Breast cancer Neg Hx    Ovarian cancer Neg Hx    Pancreatic cancer Neg Hx    Prostate cancer Neg Hx    Social History:  reports that she has  been smoking cigarettes. She started smoking about 10 years ago. She has a 2.7 pack-year smoking history. She has never used smokeless tobacco. She reports current alcohol use. She reports that she does not currently use drugs after having used the following drugs: Marijuana and MDMA (Ecstacy). Frequency: 2.00 times per week.  Allergies: Allergies[1]  Medications Prior to Admission  Medication Sig Dispense Refill   acetaminophen  (TYLENOL ) 325 MG tablet Take 2 tablets (650 mg total) by mouth every 6 (six) hours as needed for mild pain, fever or headache.     atovaquone  (MEPRON ) 750 MG/5ML suspension Take 10 mLs (1,500 mg total) by mouth daily. 210 mL 6   bictegravir-emtricitabine -tenofovir  AF (BIKTARVY ) 50-200-25 MG TABS tablet Take 1  tablet by mouth daily. 30 tablet 3   ondansetron  (ZOFRAN ) 4 MG tablet Take 1 tablet (4 mg total) by mouth every 6 (six) hours as needed for nausea. 20 tablet 0   oxyCODONE  (ROXICODONE ) 5 MG immediate release tablet Take 1 tablet (5 mg total) by mouth every 8 (eight) hours as needed. 20 tablet 0   pantoprazole  (PROTONIX ) 40 MG tablet Take 1 tablet (40 mg total) by mouth daily. 30 tablet 0   pregabalin  (LYRICA ) 25 MG capsule Take 1 capsule (25 mg total) by mouth 2 (two) times daily. 60 capsule 1   triamcinolone  ointment (KENALOG ) 0.1 % Apply 1 Application topically 2 (two) times daily. 30 g 0   albuterol  (VENTOLIN  HFA) 108 (90 Base) MCG/ACT inhaler Inhale 2 puffs into the lungs every 6 (six) hours as needed for wheezing or shortness of breath.     ATROVENT  HFA 17 MCG/ACT inhaler Inhale 2 puffs into the lungs every 6 (six) hours as needed for wheezing.     diphenhydrAMINE  HCl (BENADRYL  ALLERGY PO) Take 25 mg by mouth daily as needed (allergies).     Wound Dressings (HYDROFERA BLUE FOAM DRESSING) PADS Apply 1 each topically daily at 12 noon.      No results found for this or any previous visit (from the past 48 hours). No results found.  Review of Systems  Blood pressure 121/75, temperature 98.2 F (36.8 C), temperature source Oral, resp. rate 14, height 5' 1 (1.549 m), weight 53 kg, SpO2 100%. Physical Exam Constitutional:      General: She is not in acute distress. HENT:     Head: Normocephalic and atraumatic.  Pulmonary:     Effort: Pulmonary effort is normal. No respiratory distress.  Abdominal:     General: There is no distension.     Palpations: Abdomen is soft.     Comments: Lower abdominal incision has sutures in place, with severe tenderness to palpation and purulent drainage from the wound. There is induration.  Skin:    General: Skin is warm and dry.  Neurological:     General: No focal deficit present.     Mental Status: She is alert and oriented to person, place, and time.       Assessment/Plan 43 yo female 2 weeks s/p excision of a large SCC from the abdominal wall, presenting with severe pain and drainage from the wound. Exam is concerning for a wound infection, likely an infected seroma. Proceed to the OR for incision and drainage. Patient will be admitted postoperatively for pain control and wound care. I reviewed the planned procedure and informed consent was obtained. All questions answered.  Leonor LITTIE Dawn, MD 06/05/2024, 6:35 PM       [1]  Allergies Allergen Reactions   Bee Venom  Anaphylaxis, Shortness Of Breath and Swelling   Morphine  Hives, Itching and Other (See Comments)    And- Tolerates hydrocodone , oxycodone , and codeine with no issues   Ferrlecit  [Na Ferric Gluc Cplx In Sucrose] Swelling and Other (See Comments)    Swelling and puffiness in hands and legs after Ferrlecit  250 mg infused over 60 minutes (possibly infusion related reaction)   Latex Hives

## 2024-06-05 NOTE — Op Note (Signed)
 Date: 06/05/2024  Patient: Beverly [name] MRN: [id number]  Preoperative Diagnosis: Abdominal wound infection Postoperative Diagnosis: Same  Procedure: Incision and drainage of abdominal wall wound  Surgeon: Leonor Dawn, MD  EBL: Minimal  Anesthesia: General  Specimens: Abdominal wall wound cultures  Indications: 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 She has been having copious purulent drainage from the wound for about a week and presented to urgent office today. Exam was consistent with a wound infection, and she was brought to the operating room for wound exploration and washout.  Findings: Copious purulent fluid within the abdominal incision, sent for culture.  Procedure details: Informed consent was obtained in the preoperative area prior to the procedure. The patient was brought to the operating room and placed on the table in the supine position. General anesthesia was induced and appropriate lines and drains were placed for intraoperative monitoring. Perioperative antibiotics were administered per SCIP guidelines. The abdomen was prepped and draped in the usual sterile fashion. A pre-procedure timeout was taken verifying patient identity, surgical site and procedure to be performed.  There was purulent drainage emanating from the left side of the incision. The sutures on the left side of the incision were removed and the wound was opened. There was copious purulent fluid extending the entire length of the incision. The wound was swabbed for cultures. Most of the remaining sutures were removed, except for a few sutures on the right end of the incision. The wound was copiously irrigated with saline. There was mild oozing from the wound edges, which was controlled with cautery. The final wound dimensions measured 13cm in length by 2cm in width by 2cm in depth. The wound was left open and packed with  saline-moistened kerlex, and a clean bulky dressing was placed.  The patient tolerated the procedure well with no apparent complications. All counts were correct x2 at the end of the procedure. The patient was extubated and taken to PACU in stable condition.  Leonor Dawn, MD 06/05/2024 7:14 PM

## 2024-06-05 NOTE — Transfer of Care (Signed)
 Immediate Anesthesia Transfer of Care Note  Patient: Beverly [name]  Procedure(s) Performed: INCISION AND DRAINAGE, ABSCESS (Abdomen)  Patient Location: PACU  Anesthesia Type:General  Level of Consciousness: awake  Airway & Oxygen Therapy: Patient Spontanous Breathing and Patient connected to nasal cannula oxygen  Post-op Assessment: Report given to RN and Post -op Vital signs reviewed and stable  Post vital signs: Reviewed and stable  Last Vitals:  Vitals Value Taken Time  BP    Temp    Pulse    Resp    SpO2      Last Pain:  Vitals:   06/05/24 1805  TempSrc:   PainSc: 10-Worst pain ever         Complications: No notable events documented.

## 2024-06-05 NOTE — Plan of Care (Signed)
Discuss and review plan of care with patient/family  

## 2024-06-05 NOTE — Anesthesia Preprocedure Evaluation (Addendum)
 Anesthesia Evaluation  Patient identified by MRN, date of birth, ID band Patient awake    Reviewed: Allergy & Precautions, NPO status , [name] Chart, lab work & pertinent test results  Airway Mallampati: I  TM Distance: >3 FB Neck ROM: Full    Dental no notable dental hx.    Pulmonary asthma , Current Smoker and Patient abstained from smoking.   Pulmonary exam normal        Cardiovascular negative cardio ROS Normal cardiovascular exam     Neuro/Psych  Headaches PSYCHIATRIC DISORDERS Anxiety Depression       GI/Hepatic negative GI ROS, Neg liver ROS,,,  Endo/Other  negative endocrine ROS    Renal/GU Renal disease     Musculoskeletal   Abdominal   Peds  Hematology  (+) Blood dyscrasia, anemia , HIVAIDS   Anesthesia Other Findings ABDOMINAL WALL ABSCESS  Reproductive/Obstetrics Hcg negative                              Anesthesia Physical Anesthesia Plan  ASA: 4  Anesthesia Plan: General   Post-op Pain Management:    Induction: Intravenous  PONV Risk Score and Plan: 2 and Ondansetron , Dexamethasone , Midazolam  and Treatment may vary due to age or medical condition  Airway Management Planned: Oral ETT  Additional Equipment:   Intra-op Plan:   Post-operative Plan: Extubation in OR  Informed Consent: I have reviewed the patients History and Physical, chart, labs and discussed the procedure including the risks, benefits and alternatives for the proposed anesthesia with the patient or authorized representative who has indicated his/her understanding and acceptance.     Dental advisory given  Plan Discussed with: CRNA  Anesthesia Plan Comments:          Anesthesia Quick Evaluation

## 2024-06-06 ENCOUNTER — Encounter (HOSPITAL_COMMUNITY): Payer: Self-pay | Admitting: Surgery

## 2024-06-06 LAB — CBC
HCT: 29.5 % — ABNORMAL LOW (ref 36.0–46.0)
Hemoglobin: 9 g/dL — ABNORMAL LOW (ref 12.0–15.0)
MCH: 25.1 pg — ABNORMAL LOW (ref 26.0–34.0)
MCHC: 30.5 g/dL (ref 30.0–36.0)
MCV: 82.4 fL (ref 80.0–100.0)
Platelets: 419 K/uL — ABNORMAL HIGH (ref 150–400)
RBC: 3.58 MIL/uL — ABNORMAL LOW (ref 3.87–5.11)
RDW: 17.2 % — ABNORMAL HIGH (ref 11.5–15.5)
WBC: 3.4 K/uL — ABNORMAL LOW (ref 4.0–10.5)
nRBC: 0 % (ref 0.0–0.2)

## 2024-06-06 LAB — BASIC METABOLIC PANEL WITH GFR
Anion gap: 9 (ref 5–15)
BUN: 13 mg/dL (ref 6–20)
CO2: 24 mmol/L (ref 22–32)
Calcium: 8.8 mg/dL — ABNORMAL LOW (ref 8.9–10.3)
Chloride: 100 mmol/L (ref 98–111)
Creatinine, Ser: 0.85 mg/dL (ref 0.44–1.00)
GFR, Estimated: >60 mL/min (ref >60)
Glucose, Bld: 131 mg/dL — ABNORMAL HIGH (ref 70–99)
Potassium: 4.4 mmol/L (ref 3.5–5.1)
Sodium: 134 mmol/L — ABNORMAL LOW (ref 135–145)

## 2024-06-06 MED ORDER — HYDROMORPHONE HCL 1 MG/ML IJ SOLN
0.5000 mg | INTRAMUSCULAR | Status: DC | PRN
  Administered 2024-06-06 – 2024-06-26 (×7): 1 mg via INTRAVENOUS
  Filled 2024-06-06 (×6): qty 1

## 2024-06-06 NOTE — Plan of Care (Signed)
   Problem: Coping: Goal: Level of anxiety will decrease Outcome: Progressing   Problem: Pain Managment: Goal: General experience of comfort will improve and/or be controlled Outcome: Progressing   Problem: Safety: Goal: Ability to remain free from injury will improve Outcome: Progressing

## 2024-06-06 NOTE — Progress Notes (Signed)
 1 Day Post-Op  Subjective: Had some vomiting this morning after breakfast.  Unclear etiology.  Had not recently had pain medicine.  Otherwise had some night sweats overnight.  ROS: See above, otherwise other systems negative  Objective: Vital signs in last 24 hours: Temp:  [97.5 F (36.4 C)-99.1 F (37.3 C)] 97.5 F (36.4 C) (12/16 0527) Pulse Rate:  [61-104] 72 (12/16 0527) Resp:  [14-23] 16 (12/16 0527) BP: (91-121)/(64-92) 105/70 (12/16 0527) SpO2:  [94 %-100 %] 100 % (12/16 0527) Weight:  [53 kg] 53 kg (12/15 1625) Last BM Date : 06/05/24  Intake/Output from previous day: 12/15 0701 - 12/16 0700 In: 1120 [P.O.:720; I.V.:400] Out: 5 [Blood:5] Intake/Output this shift: No intake/output data recorded.  PE: Gen: NAD Abd: soft, very tender when trying to remove tape to look at wound.  Packing left in place currently as she will need pain meds prior to dressing change, but edges of wound that are visible with small removal of kerlex are beefy red and clean.  No surrounding erythema or induration.  Some remain sutures still in place at apex of wound.  Lab Results:  Recent Labs    06/06/24 0316  WBC 3.4*  HGB 9.0*  HCT 29.5*  PLT 419*   BMET Recent Labs    06/06/24 0316  NA 134*  K 4.4  CL 100  CO2 24  GLUCOSE 131*  BUN 13  CREATININE 0.85  CALCIUM  8.8*   PT/INR No results for input(s): LABPROT, INR in the last 72 hours. CMP     Component Value Date/Time   NA 134 (L) 06/06/2024 0316   K 4.4 06/06/2024 0316   CL 100 06/06/2024 0316   CO2 24 06/06/2024 0316   GLUCOSE 131 (H) 06/06/2024 0316   BUN 13 06/06/2024 0316   CREATININE 0.85 06/06/2024 0316   CREATININE 0.57 02/25/2024 1527   CREATININE 0.59 02/22/2024 1148   CALCIUM  8.8 (L) 06/06/2024 0316   PROT 7.6 05/24/2024 1350   ALBUMIN 2.3 (L) 05/24/2024 1350   AST 26 05/24/2024 1350   AST 18 02/25/2024 1527   ALT 13 05/24/2024 1350   ALT 12 02/25/2024 1527   ALKPHOS 94 05/24/2024 1350    BILITOT 1.0 05/24/2024 1350   BILITOT 0.4 02/25/2024 1527   GFRNONAA >60 06/06/2024 0316   GFRNONAA >60 02/25/2024 1527   GFRNONAA 114 12/26/2019 1215   GFRAA 132 12/26/2019 1215   Lipase     Component Value Date/Time   LIPASE 18 11/22/2023 1516       Studies/Results: No results found.  Anti-infectives: Anti-infectives (From admission, onward)    Start     Dose/Rate Route Frequency Ordered Stop   06/06/24 1000  bictegravir-emtricitabine -tenofovir  AF (BIKTARVY ) 50-200-25 MG per tablet 1 tablet        1 tablet Oral Daily 06/05/24 1927     06/06/24 1000  atovaquone  (MEPRON ) 750 MG/5ML suspension 1,500 mg        1,500 mg Oral Daily 06/05/24 1927     06/05/24 1645  vancomycin  (VANCOCIN ) IVPB 1000 mg/200 mL premix        1,000 mg 200 mL/hr over 60 Minutes Intravenous On call to O.R. 06/05/24 1628 06/05/24 2040   06/05/24 1631  vancomycin  (VANCOCIN ) 1-5 GM/200ML-% IVPB       Note to Pharmacy: [name] W: cabinet override      06/05/24 1631 06/06/24 0444        Assessment/Plan POD 1, s/p drainage of wound infection by  Dr. Dasie 12/15 after s/p excision of SCC by Dr. Curvin -WBC 3.4 up from 1.5 yesterday -hgb stable -cxs pending.  Currently with gram + cocci, gram - rods, fungal cx in process as well -given adequate drainage and now clean wound, no further abx needed at this time -start WD dressing changes to wound today.  If able to get a [facility] agency will transition to wound VAC  -control nausea.  Continue regular diet and monitor -mobilize  FEN - regular VTE - lovenox , but RN states patient refusing, SCDs ID - vanc for surgical prophylaxis, no further  HIV/AIDs - on home meds Anemia Asthma   LOS: 1 day    Burnard FORBES Banter , [facility] 06/06/2024, 9:34 AM Please see Amion for pager number during day hours 7:00am-4:30pm or 7:00am -11:30am on weekends

## 2024-06-07 ENCOUNTER — Ambulatory Visit

## 2024-06-07 DIAGNOSIS — R197 Diarrhea, unspecified: Secondary | ICD-10-CM | POA: Diagnosis not present

## 2024-06-07 DIAGNOSIS — B3781 Candidal esophagitis: Secondary | ICD-10-CM | POA: Diagnosis not present

## 2024-06-07 DIAGNOSIS — A419 Sepsis, unspecified organism: Secondary | ICD-10-CM | POA: Diagnosis not present

## 2024-06-07 DIAGNOSIS — E871 Hypo-osmolality and hyponatremia: Secondary | ICD-10-CM | POA: Diagnosis present

## 2024-06-07 DIAGNOSIS — D72819 Decreased white blood cell count, unspecified: Secondary | ICD-10-CM | POA: Diagnosis not present

## 2024-06-07 DIAGNOSIS — Z8249 Family history of ischemic heart disease and other diseases of the circulatory system: Secondary | ICD-10-CM | POA: Diagnosis not present

## 2024-06-07 DIAGNOSIS — B9689 Other specified bacterial agents as the cause of diseases classified elsewhere: Secondary | ICD-10-CM | POA: Diagnosis present

## 2024-06-07 DIAGNOSIS — Z79899 Other long term (current) drug therapy: Secondary | ICD-10-CM | POA: Diagnosis not present

## 2024-06-07 DIAGNOSIS — R1030 Lower abdominal pain, unspecified: Secondary | ICD-10-CM | POA: Diagnosis not present

## 2024-06-07 DIAGNOSIS — I959 Hypotension, unspecified: Secondary | ICD-10-CM | POA: Diagnosis not present

## 2024-06-07 DIAGNOSIS — E861 Hypovolemia: Secondary | ICD-10-CM | POA: Diagnosis not present

## 2024-06-07 DIAGNOSIS — Y838 Other surgical procedures as the cause of abnormal reaction of the patient, or of later complication, without mention of misadventure at the time of the procedure: Secondary | ICD-10-CM | POA: Diagnosis present

## 2024-06-07 DIAGNOSIS — F418 Other specified anxiety disorders: Secondary | ICD-10-CM | POA: Diagnosis not present

## 2024-06-07 DIAGNOSIS — T8149XA Infection following a procedure, other surgical site, initial encounter: Secondary | ICD-10-CM | POA: Diagnosis present

## 2024-06-07 DIAGNOSIS — M7989 Other specified soft tissue disorders: Secondary | ICD-10-CM | POA: Diagnosis not present

## 2024-06-07 DIAGNOSIS — F17210 Nicotine dependence, cigarettes, uncomplicated: Secondary | ICD-10-CM | POA: Diagnosis present

## 2024-06-07 DIAGNOSIS — J45909 Unspecified asthma, uncomplicated: Secondary | ICD-10-CM | POA: Diagnosis present

## 2024-06-07 DIAGNOSIS — R8271 Bacteriuria: Secondary | ICD-10-CM | POA: Diagnosis not present

## 2024-06-07 DIAGNOSIS — R509 Fever, unspecified: Secondary | ICD-10-CM | POA: Diagnosis not present

## 2024-06-07 DIAGNOSIS — D529 Folate deficiency anemia, unspecified: Secondary | ICD-10-CM | POA: Diagnosis present

## 2024-06-07 DIAGNOSIS — F32A Depression, unspecified: Secondary | ICD-10-CM | POA: Diagnosis present

## 2024-06-07 DIAGNOSIS — B20 Human immunodeficiency virus [HIV] disease: Secondary | ICD-10-CM | POA: Diagnosis present

## 2024-06-07 DIAGNOSIS — D709 Neutropenia, unspecified: Secondary | ICD-10-CM | POA: Diagnosis present

## 2024-06-07 DIAGNOSIS — Z85828 Personal history of other malignant neoplasm of skin: Secondary | ICD-10-CM | POA: Diagnosis not present

## 2024-06-07 DIAGNOSIS — R8290 Unspecified abnormal findings in urine: Secondary | ICD-10-CM | POA: Diagnosis not present

## 2024-06-07 DIAGNOSIS — T8141XA Infection following a procedure, superficial incisional surgical site, initial encounter: Secondary | ICD-10-CM | POA: Diagnosis present

## 2024-06-07 DIAGNOSIS — Z825 Family history of asthma and other chronic lower respiratory diseases: Secondary | ICD-10-CM | POA: Diagnosis not present

## 2024-06-07 DIAGNOSIS — K219 Gastro-esophageal reflux disease without esophagitis: Secondary | ICD-10-CM | POA: Diagnosis present

## 2024-06-07 DIAGNOSIS — B9562 Methicillin resistant Staphylococcus aureus infection as the cause of diseases classified elsewhere: Secondary | ICD-10-CM | POA: Diagnosis present

## 2024-06-07 DIAGNOSIS — K229 Disease of esophagus, unspecified: Secondary | ICD-10-CM | POA: Diagnosis not present

## 2024-06-07 DIAGNOSIS — Z885 Allergy status to narcotic agent status: Secondary | ICD-10-CM | POA: Diagnosis not present

## 2024-06-07 DIAGNOSIS — B965 Pseudomonas (aeruginosa) (mallei) (pseudomallei) as the cause of diseases classified elsewhere: Secondary | ICD-10-CM | POA: Diagnosis present

## 2024-06-07 DIAGNOSIS — E860 Dehydration: Secondary | ICD-10-CM | POA: Diagnosis not present

## 2024-06-07 DIAGNOSIS — A09 Infectious gastroenteritis and colitis, unspecified: Secondary | ICD-10-CM | POA: Diagnosis not present

## 2024-06-07 DIAGNOSIS — A0811 Acute gastroenteropathy due to Norwalk agent: Secondary | ICD-10-CM | POA: Diagnosis present

## 2024-06-07 DIAGNOSIS — E876 Hypokalemia: Secondary | ICD-10-CM | POA: Diagnosis present

## 2024-06-07 DIAGNOSIS — B961 Klebsiella pneumoniae [K. pneumoniae] as the cause of diseases classified elsewhere: Secondary | ICD-10-CM | POA: Diagnosis present

## 2024-06-07 DIAGNOSIS — B370 Candidal stomatitis: Secondary | ICD-10-CM | POA: Diagnosis not present

## 2024-06-07 DIAGNOSIS — I809 Phlebitis and thrombophlebitis of unspecified site: Secondary | ICD-10-CM | POA: Diagnosis not present

## 2024-06-07 DIAGNOSIS — R1319 Other dysphagia: Secondary | ICD-10-CM | POA: Diagnosis not present

## 2024-06-07 MED ORDER — LAMOTRIGINE 25 MG PO TABS
25.0000 mg | ORAL_TABLET | Freq: Every morning | ORAL | Status: DC
  Administered 2024-06-07 – 2024-06-27 (×20): 25 mg via ORAL
  Filled 2024-06-07 (×13): qty 1

## 2024-06-07 MED ORDER — LORAZEPAM 2 MG/ML IJ SOLN
0.5000 mg | Freq: Four times a day (QID) | INTRAMUSCULAR | Status: DC | PRN
  Administered 2024-06-07 – 2024-06-23 (×12): 0.5 mg via INTRAVENOUS
  Filled 2024-06-07 (×15): qty 1

## 2024-06-07 MED ORDER — GABAPENTIN 100 MG PO CAPS
100.0000 mg | ORAL_CAPSULE | Freq: Three times a day (TID) | ORAL | Status: DC
  Administered 2024-06-07 – 2024-06-17 (×29): 100 mg via ORAL
  Filled 2024-06-07 (×29): qty 1

## 2024-06-07 NOTE — TOC Initial Note (Signed)
 Transition of Care [facility]) - Initial/Assessment Note    Patient Details  Name: Beverly [name] MRN: [id number] Date of Birth: 28-Dec-1980  Transition of Care [facility]) CM/SW Contact:    NORMAN ASPEN, LCSW Phone Number: 06/07/2024, 2:44 PM  Clinical Narrative:                  Met with pt to review anticipated dc needs.  Per CCS, preferred treatment plan for pt's wound would be a wound VAC with HHRN follow up.  Unfortunately, all area [facility] agencies have declined acceptance due to insurance coverage ([employer] [place]).   Pt confirms that she is homeless but currently living in an apt with person who is letting her stay (address is [address]   [place], [place]).  She feels safe discharging to this apartment, however, other occupant will not be assisting her in any way.  Have alerted [facility] PA and will continue to work on other options.     Expected Discharge Plan: Home/Self Care Barriers to Discharge: No Home Care Agency will accept this patient, Inadequate or no insurance, Continued Medical Work up   Patient Goals and CMS Choice Patient states their goals for this hospitalization and ongoing recovery are:: return home          Expected Discharge Plan and Services In-house Referral: Clinical Social Work     Living arrangements for the past 2 months: Apartment                                      Prior Living Arrangements/Services Living arrangements for the past 2 months: Apartment Lives with:: Roommate Patient language and need for interpreter reviewed:: Yes Do you feel safe going back to the place where you live?: Yes      Need for Family Participation in Patient Care: Yes (Comment) Care giver support system in place?: No (comment)   Criminal Activity/Legal Involvement Pertinent to Current Situation/Hospitalization: No - Comment as needed  Activities of Daily Living   ADL Screening (condition at time of admission) Independently performs ADLs?: Yes  (appropriate for developmental age) Is the patient deaf or have difficulty hearing?: No Does the patient have difficulty seeing, even when wearing glasses/contacts?: No Does the patient have difficulty concentrating, remembering, or making decisions?: No  Permission Sought/Granted Permission sought to share information with : Family Supports Permission granted to share information with : Yes, Verbal Permission Granted  Share Information with NAME: sister, Clotilda Shoulder @ [phone]           Emotional Assessment Appearance:: Appears stated age Attitude/Demeanor/Rapport: Gracious Affect (typically observed): Accepting Orientation: : Oriented to Self, Oriented to Place, Oriented to  Time, Oriented to Situation Alcohol / Substance Use: Not Applicable Psych Involvement: No (comment)  Admission diagnosis:  Wound infection after surgery [T81.49XA] Patient Active Problem List   Diagnosis Date Noted   Wound infection after surgery 06/05/2024   Symptomatic HIV infection (HCC) 03/27/2024   SCC (squamous cell carcinoma) 03/27/2024   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/03/2024   Sepsis due to cellulitis (HCC) 02/12/2024   Bleeding from wound 02/12/2024   Housing insecurity 01/01/2023   Transportation insecurity 01/01/2023   Cellulitis 12/21/2022   Hypocalcemia 12/21/2022   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   Nephrolithiasis 12/21/2022   Hypomagnesemia 01/23/2021   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   Nonintractable headache    Generalized lymphadenopathy  Skin rash associated with AIDS (HCC) 08/27/2020   Cellulitis of abdominal wall    HIV disease (HCC)    Weight loss    Nausea & vomiting 05/25/2018   Abdominal wall skin ulcer 03/10/2018   Itching 03/10/2018   History of 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/11/2016   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   Non-healing skin lesion 05/06/2015   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   Hypokalemia 08/13/2014   Depression 10/17/2013    Anemia 10/17/2013   Cigarette smoker 10/17/2013   Asthma 10/17/2013   Encounter for general adult medical examination without abnormal findings 06/12/2011   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   Anxiety and depression 07/17/2008   AIDS (acquired immune deficiency syndrome) (HCC) 06/28/2008   PCP:  Melvenia Corean SAILOR, NP Pharmacy:   [id number] - [place], [place] - [address] [place] [address] Phone: [phone] Fax: [phone]  [id number] [place], [place] - [address] AT [facility] OF [address] [place] [address] Phone: [phone] Fax: [phone]     Social Drivers of Health (SDOH) Social History: SDOH Screenings   Food Insecurity: Food Insecurity Present (06/05/2024)  Housing: High Risk (06/05/2024)  Transportation Needs: No Transportation Needs (06/05/2024)  Utilities: At Risk (06/05/2024)  Depression (PHQ2-9): Medium Risk (04/06/2024)  Tobacco Use: High Risk (06/05/2024)   Received from [facility]   SDOH Interventions:     Readmission Risk Interventions    06/07/2024    2:41 PM 02/13/2024   10:23 AM  Readmission Risk Prevention Plan  Transportation Screening Complete Complete  PCP or Specialist Appt within 5-7 Days  Not Complete  Not Complete comments  N/A, pt has Medicaid  PCP or Specialist Appt within 3-5 Days Complete   Home Care Screening  Complete  Medication Review (RN CM)  Complete  HRI or Home Care Consult Complete   Social Work Consult for Recovery Care Planning/Counseling Complete   Palliative Care Screening Not Applicable   Medication Review [employer]) Complete

## 2024-06-07 NOTE — Progress Notes (Signed)
 2 Days Post-Op  Subjective: No further vomiting.  Having a lot of pain with her dressing changes.  Would like to consider a wound VAC if this is an option  Objective: Vital signs in last 24 hours: Temp:  [97.4 F (36.3 C)-97.6 F (36.4 C)] 97.6 F (36.4 C) (12/17 0511) Pulse Rate:  [65-85] 65 (12/17 0511) Resp:  [14-17] 17 (12/17 0511) BP: (108-119)/(81-98) 117/98 (12/17 0511) SpO2:  [100 %] 100 % (12/17 0511) Last BM Date : 06/07/24  Intake/Output from previous day: 12/16 0701 - 12/17 0700 In: 1080 [P.O.:1080] Out: -  Intake/Output this shift: No intake/output data recorded.  PE: Gen: NAD Abd: soft, still very tender to even remove tape.  Packing remains in place and edges remain clean with no erythema or induration.  Patient won't allow dressing to be removed until it's time for her dressing change and she gets pain meds.  Lab Results:  Recent Labs    06/06/24 0316  WBC 3.4*  HGB 9.0*  HCT 29.5*  PLT 419*   BMET Recent Labs    06/06/24 0316  NA 134*  K 4.4  CL 100  CO2 24  GLUCOSE 131*  BUN 13  CREATININE 0.85  CALCIUM  8.8*   PT/INR No results for input(s): LABPROT, INR in the last 72 hours. CMP     Component Value Date/Time   NA 134 (L) 06/06/2024 0316   K 4.4 06/06/2024 0316   CL 100 06/06/2024 0316   CO2 24 06/06/2024 0316   GLUCOSE 131 (H) 06/06/2024 0316   BUN 13 06/06/2024 0316   CREATININE 0.85 06/06/2024 0316   CREATININE 0.57 02/25/2024 1527   CREATININE 0.59 02/22/2024 1148   CALCIUM  8.8 (L) 06/06/2024 0316   PROT 7.6 05/24/2024 1350   ALBUMIN 2.3 (L) 05/24/2024 1350   AST 26 05/24/2024 1350   AST 18 02/25/2024 1527   ALT 13 05/24/2024 1350   ALT 12 02/25/2024 1527   ALKPHOS 94 05/24/2024 1350   BILITOT 1.0 05/24/2024 1350   BILITOT 0.4 02/25/2024 1527   GFRNONAA >60 06/06/2024 0316   GFRNONAA >60 02/25/2024 1527   GFRNONAA 114 12/26/2019 1215   GFRAA 132 12/26/2019 1215   Lipase     Component Value Date/Time    LIPASE 18 11/22/2023 1516       Studies/Results: No results found.  Anti-infectives: Anti-infectives (From admission, onward)    Start     Dose/Rate Route Frequency Ordered Stop   06/06/24 1000  bictegravir-emtricitabine -tenofovir  AF (BIKTARVY ) 50-200-25 MG per tablet 1 tablet        1 tablet Oral Daily 06/05/24 1927     06/06/24 1000  atovaquone  (MEPRON ) 750 MG/5ML suspension 1,500 mg        1,500 mg Oral Daily 06/05/24 1927     06/05/24 1645  vancomycin  (VANCOCIN ) IVPB 1000 mg/200 mL premix        1,000 mg 200 mL/hr over 60 Minutes Intravenous On call to O.R. 06/05/24 1628 06/05/24 2040   06/05/24 1631  vancomycin  (VANCOCIN ) 1-5 GM/200ML-% IVPB       Note to Pharmacy: [name] W: cabinet override      06/05/24 1631 06/06/24 0444        Assessment/Plan POD 2, s/p drainage of wound infection by Dr. Dasie 12/15 after s/p excision of SCC by Dr. Curvin -WBC 3.4  -hgb stable yesterday -cxs pending.  Currently with gram + cocci, gram - rods, fungal cx in process as well -given adequate  drainage and now clean wound, no further abx needed at this time -start WD dressing changes to wound today.  If able to get a [facility] agency will transition to wound VAC.  TOC working on this. -control nausea.  Continue regular diet and monitor -mobilize  FEN - regular VTE - lovenox , but RN states patient refusing, SCDs ID - vanc for surgical prophylaxis, no further Dispo - pending pain control and possible VAC application  HIV/AIDs - on home meds Anemia Asthma   LOS: 1 day    Burnard FORBES Banter , [facility] 06/07/2024, 10:24 AM Please see Amion for pager number during day hours 7:00am-4:30pm or 7:00am -11:30am on weekends

## 2024-06-07 NOTE — Plan of Care (Signed)
  Problem: Education: Goal: Knowledge of General Education information will improve Description: Including pain rating scale, medication(s)/side effects and non-pharmacologic comfort measures Outcome: Progressing   Problem: Health Behavior/Discharge Planning: Goal: Ability to manage health-related needs will improve Outcome: Progressing   Problem: Clinical Measurements: Goal: Ability to maintain clinical measurements within normal limits will improve Outcome: Progressing Goal: Will remain free from infection Outcome: Progressing Goal: Diagnostic test results will improve Outcome: Progressing Goal: Respiratory complications will improve Outcome: Progressing Goal: Cardiovascular complication will be avoided Outcome: Progressing   Problem: Activity: Goal: Risk for activity intolerance will decrease Outcome: Progressing   Problem: Nutrition: Goal: Adequate nutrition will be maintained Outcome: Progressing   Problem: Coping: Goal: Level of anxiety will decrease Outcome: Progressing   Problem: Skin Integrity: Goal: Risk for impaired skin integrity will decrease Outcome: Progressing   Problem: Safety: Goal: Ability to remain free from injury will improve Outcome: Progressing

## 2024-06-07 NOTE — Progress Notes (Signed)
 Patient does not want dressing change done this shift. She said it was just changed recently and it was way too painful for her to tolerate again. Dressing is currently CDI.  She would like to speak with the surgeon about having a wound vac placed, said someone had spoke to her about this option, and she is also asking about home health as she said she will not be able to do any dressing changes herself, and her roommates are not able to either.  Patient also requesting help making her sister HCPOA. Will place consults for TOC and Chaplain.

## 2024-06-07 NOTE — Plan of Care (Signed)

## 2024-06-08 ENCOUNTER — Other Ambulatory Visit (HOSPITAL_COMMUNITY): Payer: Self-pay

## 2024-06-08 MED ORDER — PROCHLORPERAZINE EDISYLATE 10 MG/2ML IJ SOLN
10.0000 mg | Freq: Four times a day (QID) | INTRAMUSCULAR | Status: DC | PRN
  Administered 2024-06-08: 11:00:00 10 mg via INTRAVENOUS
  Filled 2024-06-08: qty 2

## 2024-06-08 MED ORDER — OXYCODONE HCL 5 MG PO TABS
10.0000 mg | ORAL_TABLET | ORAL | Status: DC | PRN
  Administered 2024-06-08 – 2024-06-09 (×2): 10 mg via ORAL
  Administered 2024-06-09 – 2024-06-10 (×2): 15 mg via ORAL
  Administered 2024-06-10: 10 mg via ORAL
  Administered 2024-06-11: 15 mg via ORAL
  Administered 2024-06-11 – 2024-06-15 (×11): 10 mg via ORAL
  Administered 2024-06-16 (×2): 15 mg via ORAL
  Administered 2024-06-17 – 2024-06-18 (×5): 10 mg via ORAL
  Administered 2024-06-19 (×2): 15 mg via ORAL
  Administered 2024-06-19 – 2024-06-20 (×3): 10 mg via ORAL
  Administered 2024-06-20 – 2024-06-21 (×3): 15 mg via ORAL
  Administered 2024-06-22: 10 mg via ORAL
  Administered 2024-06-22: 15 mg via ORAL
  Administered 2024-06-23 – 2024-06-26 (×7): 10 mg via ORAL
  Administered 2024-06-27: 15 mg via ORAL
  Filled 2024-06-08: qty 2
  Filled 2024-06-08 (×2): qty 3
  Filled 2024-06-08 (×2): qty 2
  Filled 2024-06-08 (×2): qty 3
  Filled 2024-06-08 (×13): qty 2
  Filled 2024-06-08: qty 3
  Filled 2024-06-08: qty 2
  Filled 2024-06-08 (×2): qty 3
  Filled 2024-06-08 (×2): qty 2

## 2024-06-08 NOTE — Plan of Care (Signed)
°  Problem: Education: Goal: Knowledge of General Education information will improve Description: Including pain rating scale, medication(s)/side effects and non-pharmacologic comfort measures Outcome: Progressing   Problem: Health Behavior/Discharge Planning: Goal: Ability to manage health-related needs will improve Outcome: Progressing   Problem: Clinical Measurements: Goal: Ability to maintain clinical measurements within normal limits will improve Outcome: Progressing Goal: Will remain free from infection Outcome: Progressing Goal: Diagnostic test results will improve Outcome: Progressing Goal: Respiratory complications will improve Outcome: Progressing Goal: Cardiovascular complication will be avoided Outcome: Progressing   Problem: Activity: Goal: Risk for activity intolerance will decrease Outcome: Adequate for Discharge   Problem: Nutrition: Goal: Adequate nutrition will be maintained Outcome: Progressing   Problem: Coping: Goal: Level of anxiety will decrease Outcome: Progressing   Problem: Elimination: Goal: Will not experience complications related to bowel motility Outcome: Completed/Met Goal: Will not experience complications related to urinary retention Outcome: Completed/Met   Problem: Pain Managment: Goal: General experience of comfort will improve and/or be controlled Outcome: Progressing   Problem: Safety: Goal: Ability to remain free from injury will improve Outcome: Progressing   Problem: Skin Integrity: Goal: Risk for impaired skin integrity will decrease Outcome: Progressing

## 2024-06-08 NOTE — Progress Notes (Signed)
 Chaplains received a consult to give Beverly [name] information on advance directives. I met with her and provided her with the paperwork.  She was not feeling up to going through it right now, but will look it over and we can review it tomorrow if she is feeling better.   [address], Bcc Pager, [phone]

## 2024-06-08 NOTE — Progress Notes (Signed)
 3 Days Post-Op  Subjective: Up in restroom.  Just had an accident in the floor.  Objective: Vital signs in last 24 hours: Temp:  [98.7 F (37.1 C)-99.8 F (37.7 C)] 99.1 F (37.3 C) (12/18 0840) Pulse Rate:  [91-107] 100 (12/18 0840) Resp:  [15-18] 15 (12/18 0840) BP: (96-113)/(63-85) 99/65 (12/18 0840) SpO2:  [98 %-100 %] 98 % (12/18 0840) Last BM Date : 06/07/24  Intake/Output from previous day: 12/17 0701 - 12/18 0700 In: 240 [P.O.:240] Out: 1 [Urine:1] Intake/Output this shift: No intake/output data recorded.  PE: Gen: NAD Abd: dressing in place, on toilet.  Will return with dressing change today to see wound  Lab Results:  Recent Labs    06/06/24 0316  WBC 3.4*  HGB 9.0*  HCT 29.5*  PLT 419*   BMET Recent Labs    06/06/24 0316  NA 134*  K 4.4  CL 100  CO2 24  GLUCOSE 131*  BUN 13  CREATININE 0.85  CALCIUM  8.8*   PT/INR No results for input(s): LABPROT, INR in the last 72 hours. CMP     Component Value Date/Time   NA 134 (L) 06/06/2024 0316   K 4.4 06/06/2024 0316   CL 100 06/06/2024 0316   CO2 24 06/06/2024 0316   GLUCOSE 131 (H) 06/06/2024 0316   BUN 13 06/06/2024 0316   CREATININE 0.85 06/06/2024 0316   CREATININE 0.57 02/25/2024 1527   CREATININE 0.59 02/22/2024 1148   CALCIUM  8.8 (L) 06/06/2024 0316   PROT 7.6 05/24/2024 1350   ALBUMIN 2.3 (L) 05/24/2024 1350   AST 26 05/24/2024 1350   AST 18 02/25/2024 1527   ALT 13 05/24/2024 1350   ALT 12 02/25/2024 1527   ALKPHOS 94 05/24/2024 1350   BILITOT 1.0 05/24/2024 1350   BILITOT 0.4 02/25/2024 1527   GFRNONAA >60 06/06/2024 0316   GFRNONAA >60 02/25/2024 1527   GFRNONAA 114 12/26/2019 1215   GFRAA 132 12/26/2019 1215   Lipase     Component Value Date/Time   LIPASE 18 11/22/2023 1516       Studies/Results: No results found.  Anti-infectives: Anti-infectives (From admission, onward)    Start     Dose/Rate Route Frequency Ordered Stop   06/06/24 1000   bictegravir-emtricitabine -tenofovir  AF (BIKTARVY ) 50-200-25 MG per tablet 1 tablet        1 tablet Oral Daily 06/05/24 1927     06/06/24 1000  atovaquone  (MEPRON ) 750 MG/5ML suspension 1,500 mg        1,500 mg Oral Daily 06/05/24 1927     06/05/24 1645  vancomycin  (VANCOCIN ) IVPB 1000 mg/200 mL premix        1,000 mg 200 mL/hr over 60 Minutes Intravenous On call to O.R. 06/05/24 1628 06/05/24 2040   06/05/24 1631  vancomycin  (VANCOCIN ) 1-5 GM/200ML-% IVPB       Note to Pharmacy: [name] W: cabinet override      06/05/24 1631 06/06/24 0444        Assessment/Plan POD 3, s/p drainage of wound infection by Dr. 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  Unable to get a [facility] agency so unable to do [facility] or get [facility] RN to help with dressing care.  Will need to discuss seeing if the  patient has someone who can help her with wound care -Continue regular diet and monitor -mobilize  FEN - regular VTE - lovenox , but RN states patient refusing, SCDs ID - vanc for surgical prophylaxis, no further Dispo - pending pain control and wound care needed  HIV/AIDs - on home meds Anemia Asthma   LOS: 2 days    Burnard FORBES Banter , [facility] 06/08/2024, 9:36 AM Please see Amion for pager number during day hours 7:00am-4:30pm or 7:00am -11:30am on weekends

## 2024-06-08 NOTE — Progress Notes (Signed)
 Seen with bedside RN for dressing change, pre-medicated with IV dilaudid . Will plan tomorrow for dressing change to give 15 mg oxycodone  ahead of time and have IV dilaudid  available if needed but try to get through dressing change with PO meds only. Wound overall with healthy appearing granulation. She does have fibrinous vs purulent appearing material on packing but no concern for infection that is ongoing looking at wound itself. Will discuss with attending MD as well.  If patient unable to tolerate dressing change with increased dose oxycodone  tomorrow, may need to consider trial of other PO pain meds for dressing changes. Patient understands that if she does not want to go to SNF for wound care that she is going to have to figure out how to change dressing either herself or find a support person to help with dressing changes.   Burnard JONELLE Louder, [facility] 06/08/2024, 2:41 PM Please see Amion for pager number during day hours 7:00am-4:30pm

## 2024-06-08 NOTE — Progress Notes (Signed)
°   06/08/24 0940  Spiritual Encounters  Type of Visit Attempt (pt unavailable)  Care provided to: Pt not available  Reason for visit Advance directives  OnCall Visit No    Chaplain responded to spiritual care consult for HCPOA, however pt Beverly [name] was unavailable. Chaplains will follow up.

## 2024-06-08 NOTE — Plan of Care (Signed)

## 2024-06-09 LAB — BASIC METABOLIC PANEL WITH GFR
Anion gap: 10 (ref 5–15)
BUN: 17 mg/dL (ref 6–20)
CO2: 23 mmol/L (ref 22–32)
Calcium: 8.6 mg/dL — ABNORMAL LOW (ref 8.9–10.3)
Chloride: 100 mmol/L (ref 98–111)
Creatinine, Ser: 1.06 mg/dL — ABNORMAL HIGH (ref 0.44–1.00)
GFR, Estimated: >60 mL/min
Glucose, Bld: 102 mg/dL — ABNORMAL HIGH (ref 70–99)
Potassium: 3.4 mmol/L — ABNORMAL LOW (ref 3.5–5.1)
Sodium: 133 mmol/L — ABNORMAL LOW (ref 135–145)

## 2024-06-09 LAB — CBC
HCT: 30.9 % — ABNORMAL LOW (ref 36.0–46.0)
Hemoglobin: 9.4 g/dL — ABNORMAL LOW (ref 12.0–15.0)
MCH: 24.9 pg — ABNORMAL LOW (ref 26.0–34.0)
MCHC: 30.4 g/dL (ref 30.0–36.0)
MCV: 82 fL (ref 80.0–100.0)
Platelets: 519 K/uL — ABNORMAL HIGH (ref 150–400)
RBC: 3.77 MIL/uL — ABNORMAL LOW (ref 3.87–5.11)
RDW: 17.4 % — ABNORMAL HIGH (ref 11.5–15.5)
WBC: 3.2 K/uL — ABNORMAL LOW (ref 4.0–10.5)
nRBC: 0 % (ref 0.0–0.2)

## 2024-06-09 LAB — AEROBIC/ANAEROBIC CULTURE W GRAM STAIN (SURGICAL/DEEP WOUND)

## 2024-06-09 NOTE — Plan of Care (Signed)

## 2024-06-09 NOTE — Progress Notes (Signed)
 4 Days Post-Op   Subjective/Chief Complaint: Complains of pain. unchanged  Objective: Vital signs in last 24 hours: Temp:  [98 F (36.7 C)-99.1 F (37.3 C)] 98.5 F (36.9 C) (12/19 9386) Pulse Rate:  [88-107] 88 (12/19 0613) Resp:  [15-18] 18 (12/19 0613) BP: (95-104)/(62-76) 102/76 (12/19 0613) SpO2:  [97 %-99 %] 99 % (12/19 0613) Last BM Date : 06/08/24  Intake/Output from previous day: 12/18 0701 - 12/19 0700 In: 1060 [P.O.:1060] Out: -  Intake/Output this shift: No intake/output data recorded.  General appearance: alert and cooperative Resp: clear to auscultation bilaterally Cardio: regular rate and rhythm GI: soft, tender at incision  Lab Results:  Recent Labs    06/09/24 0327  WBC 3.2*  HGB 9.4*  HCT 30.9*  PLT 519*   BMET Recent Labs    06/09/24 0327  NA 133*  K 3.4*  CL 100  CO2 23  GLUCOSE 102*  BUN 17  CREATININE 1.06*  CALCIUM  8.6*   PT/INR No results for input(s): LABPROT, INR in the last 72 hours. ABG No results for input(s): PHART, HCO3 in the last 72 hours.  Invalid input(s): PCO2, PO2  Studies/Results: No results found.  Anti-infectives: Anti-infectives (From admission, onward)    Start     Dose/Rate Route Frequency Ordered Stop   06/06/24 1000  bictegravir-emtricitabine -tenofovir  AF (BIKTARVY ) 50-200-25 MG per tablet 1 tablet        1 tablet Oral Daily 06/05/24 1927     06/06/24 1000  atovaquone  (MEPRON ) 750 MG/5ML suspension 1,500 mg        1,500 mg Oral Daily 06/05/24 1927     06/05/24 1645  vancomycin  (VANCOCIN ) IVPB 1000 mg/200 mL premix        1,000 mg 200 mL/hr over 60 Minutes Intravenous On call to O.R. 06/05/24 1628 06/05/24 2040   06/05/24 1631  vancomycin  (VANCOCIN ) 1-5 GM/200ML-% IVPB       Note to Pharmacy: [name] W: cabinet override      06/05/24 1631 06/06/24 0444       Assessment/Plan: s/p Procedures: INCISION AND DRAINAGE OF ABDOMINAL WALL ASBCESS (Bilateral) Advance diet Will check  wound during dressing change POD 4, s/p drainage of wound infection by Dr. 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  Unable to get a [facility] agency so unable to do [facility] or get [facility] RN to help with dressing care.  Will need to discuss seeing if the patient has someone who can help her with wound care -Continue regular diet and monitor -mobilize   FEN - regular VTE - lovenox , but RN states patient refusing, SCDs ID - vanc for surgical prophylaxis, no further Dispo - pending pain control and wound care needed   HIV/AIDs - on home meds Anemia Asthma  LOS: 3 days    Deward Curvin III 06/09/2024

## 2024-06-09 NOTE — Progress Notes (Signed)
 Dressing change performed at bedside with assistance from bedside RN at 1300. Patient pre-medicated with 15 oxycodone  and dressing change able to be performed without IV dilaudid . Wound stable compared to photo from 12/18, wound base pink and moist with some purulence on gauze but no necrotic tissue of wound base. Patient is trying to arrange to take care of the wound at home by herself/possibly with intermittent help from her sister.   Almarie Pringle, PA-C [facility] Please see Amion for pager number during day hours 7:00am-4:30pm

## 2024-06-10 NOTE — Progress Notes (Signed)
 5 Days Post-Op   Subjective/Chief Complaint: Pt reports that she is still having significant pain with dressing changes.  She reports that her sister is hopefully coming today to help learn how to change the bandages   Objective: Vital signs in last 24 hours: Temp:  [97.9 F (36.6 C)-98.6 F (37 C)] 98.6 F (37 C) (12/20 0521) Pulse Rate:  [94-105] 98 (12/20 0521) Resp:  [15-16] 16 (12/20 0521) BP: (95-109)/(61-79) 95/61 (12/20 0521) SpO2:  [98 %] 98 % (12/20 0521) Last BM Date : 06/09/24  Intake/Output from previous day: 12/19 0701 - 12/20 0700 In: 1020 [P.O.:1020] Out: -  Intake/Output this shift: No intake/output data recorded.  Exam: Awake and alert Comfortable Abdomen soft, dressing with so drainage  Lab Results:  Recent Labs    06/09/24 0327  WBC 3.2*  HGB 9.4*  HCT 30.9*  PLT 519*   BMET Recent Labs    06/09/24 0327  NA 133*  K 3.4*  CL 100  CO2 23  GLUCOSE 102*  BUN 17  CREATININE 1.06*  CALCIUM  8.6*   PT/INR No results for input(s): LABPROT, INR in the last 72 hours. ABG No results for input(s): PHART, HCO3 in the last 72 hours.  Invalid input(s): PCO2, PO2  Studies/Results: No results found.  Anti-infectives: Anti-infectives (From admission, onward)    Start     Dose/Rate Route Frequency Ordered Stop   06/06/24 1000  bictegravir-emtricitabine -tenofovir  AF (BIKTARVY ) 50-200-25 MG per tablet 1 tablet        1 tablet Oral Daily 06/05/24 1927     06/06/24 1000  atovaquone  (MEPRON ) 750 MG/5ML suspension 1,500 mg        1,500 mg Oral Daily 06/05/24 1927     06/05/24 1645  vancomycin  (VANCOCIN ) IVPB 1000 mg/200 mL premix        1,000 mg 200 mL/hr over 60 Minutes Intravenous On call to O.R. 06/05/24 1628 06/05/24 2040   06/05/24 1631  vancomycin  (VANCOCIN ) 1-5 GM/200ML-% IVPB       Note to Pharmacy: [name] W: cabinet override      06/05/24 1631 06/06/24 0444       Assessment/Plan: POD 5, s/p drainage of wound  infection by Dr. 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

## 2024-06-10 NOTE — Plan of Care (Signed)
   Problem: Coping: Goal: Level of anxiety will decrease Outcome: Progressing   Problem: Pain Managment: Goal: General experience of comfort will improve and/or be controlled Outcome: Progressing   Problem: Safety: Goal: Ability to remain free from injury will improve Outcome: Progressing

## 2024-06-10 NOTE — Plan of Care (Signed)
" °  Problem: Education: Goal: Knowledge of General Education information will improve Description: Including pain rating scale, medication(s)/side effects and non-pharmacologic comfort measures Outcome: Adequate for Discharge   Problem: Health Behavior/Discharge Planning: Goal: Ability to manage health-related needs will improve Outcome: Progressing   Problem: Clinical Measurements: Goal: Ability to maintain clinical measurements within normal limits will improve Outcome: Progressing Goal: Will remain free from infection Outcome: Progressing Goal: Diagnostic test results will improve Outcome: Progressing Goal: Respiratory complications will improve Outcome: Progressing Goal: Cardiovascular complication will be avoided Outcome: Progressing   Problem: Activity: Goal: Risk for activity intolerance will decrease Outcome: Progressing   Problem: Nutrition: Goal: Adequate nutrition will be maintained Outcome: Adequate for Discharge   Problem: Coping: Goal: Level of anxiety will decrease Outcome: Progressing   Problem: Pain Managment: Goal: General experience of comfort will improve and/or be controlled Outcome: Progressing   Problem: Safety: Goal: Ability to remain free from injury will improve Outcome: Progressing   Problem: Skin Integrity: Goal: Risk for impaired skin integrity will decrease Outcome: Progressing   "

## 2024-06-10 NOTE — Plan of Care (Signed)
   Problem: Education: Goal: Knowledge of General Education information will improve Description: Including pain rating scale, medication(s)/side effects and non-pharmacologic comfort measures Outcome: Progressing   Problem: Activity: Goal: Risk for activity intolerance will decrease Outcome: Progressing   Problem: Nutrition: Goal: Adequate nutrition will be maintained Outcome: Progressing

## 2024-06-11 ENCOUNTER — Inpatient Hospital Stay (HOSPITAL_COMMUNITY)

## 2024-06-11 DIAGNOSIS — R1030 Lower abdominal pain, unspecified: Secondary | ICD-10-CM

## 2024-06-11 DIAGNOSIS — E860 Dehydration: Secondary | ICD-10-CM

## 2024-06-11 DIAGNOSIS — R197 Diarrhea, unspecified: Secondary | ICD-10-CM | POA: Diagnosis not present

## 2024-06-11 DIAGNOSIS — I959 Hypotension, unspecified: Secondary | ICD-10-CM | POA: Diagnosis not present

## 2024-06-11 LAB — COMPREHENSIVE METABOLIC PANEL WITH GFR
ALT: 7 U/L (ref 0–44)
AST: 17 U/L (ref 15–41)
Albumin: 3 g/dL — ABNORMAL LOW (ref 3.5–5.0)
Alkaline Phosphatase: 143 U/L — ABNORMAL HIGH (ref 38–126)
Anion gap: 13 (ref 5–15)
BUN: 11 mg/dL (ref 6–20)
CO2: 20 mmol/L — ABNORMAL LOW (ref 22–32)
Calcium: 8.7 mg/dL — ABNORMAL LOW (ref 8.9–10.3)
Chloride: 97 mmol/L — ABNORMAL LOW (ref 98–111)
Creatinine, Ser: 0.89 mg/dL (ref 0.44–1.00)
GFR, Estimated: >60 mL/min
Glucose, Bld: 104 mg/dL — ABNORMAL HIGH (ref 70–99)
Potassium: 3.6 mmol/L (ref 3.5–5.1)
Sodium: 130 mmol/L — ABNORMAL LOW (ref 135–145)
Total Bilirubin: 0.4 mg/dL (ref 0.0–1.2)
Total Protein: 8.9 g/dL — ABNORMAL HIGH (ref 6.5–8.1)

## 2024-06-11 LAB — CBC WITH DIFFERENTIAL/PLATELET
Abs Immature Granulocytes: 0.03 K/uL (ref 0.00–0.07)
Basophils Absolute: 0 K/uL (ref 0.0–0.1)
Basophils Relative: 1 %
Eosinophils Absolute: 0.1 K/uL (ref 0.0–0.5)
Eosinophils Relative: 3 %
HCT: 30.6 % — ABNORMAL LOW (ref 36.0–46.0)
Hemoglobin: 9.5 g/dL — ABNORMAL LOW (ref 12.0–15.0)
Immature Granulocytes: 1 %
Lymphocytes Relative: 28 %
Lymphs Abs: 0.7 K/uL (ref 0.7–4.0)
MCH: 24.9 pg — ABNORMAL LOW (ref 26.0–34.0)
MCHC: 31 g/dL (ref 30.0–36.0)
MCV: 80.3 fL (ref 80.0–100.0)
Monocytes Absolute: 0.2 K/uL (ref 0.1–1.0)
Monocytes Relative: 10 %
Neutro Abs: 1.4 K/uL — ABNORMAL LOW (ref 1.7–7.7)
Neutrophils Relative %: 57 %
Platelets: 476 K/uL — ABNORMAL HIGH (ref 150–400)
RBC: 3.81 MIL/uL — ABNORMAL LOW (ref 3.87–5.11)
RDW: 17.6 % — ABNORMAL HIGH (ref 11.5–15.5)
Smear Review: NORMAL
WBC: 2.4 K/uL — ABNORMAL LOW (ref 4.0–10.5)
nRBC: 0 % (ref 0.0–0.2)

## 2024-06-11 LAB — PROTIME-INR
INR: 1.1 (ref 0.8–1.2)
Prothrombin Time: 14.4 s (ref 11.4–15.2)

## 2024-06-11 LAB — URINALYSIS, ROUTINE W REFLEX MICROSCOPIC
Bacteria, UA: NONE SEEN
Bilirubin Urine: NEGATIVE
Glucose, UA: NEGATIVE mg/dL
Ketones, ur: NEGATIVE mg/dL
Nitrite: POSITIVE — AB
Protein, ur: NEGATIVE mg/dL
Specific Gravity, Urine: 1.039 — ABNORMAL HIGH (ref 1.005–1.030)
WBC, UA: >50 WBC/hpf (ref 0–5)
pH: 6 (ref 5.0–8.0)

## 2024-06-11 LAB — LIPASE, BLOOD: Lipase: 22 U/L (ref 11–51)

## 2024-06-11 LAB — MAGNESIUM: Magnesium: 1.4 mg/dL — ABNORMAL LOW (ref 1.7–2.4)

## 2024-06-11 LAB — PHOSPHORUS: Phosphorus: 3.2 mg/dL (ref 2.5–4.6)

## 2024-06-11 MED ORDER — BISACODYL 10 MG RE SUPP: 10.0000 mg | Freq: Two times a day (BID) | RECTAL | Status: DC | PRN

## 2024-06-11 MED ORDER — CALCIUM POLYCARBOPHIL 625 MG PO TABS: 625.0000 mg | ORAL_TABLET | Freq: Two times a day (BID) | ORAL | Status: DC

## 2024-06-11 MED ORDER — ACETAMINOPHEN 650 MG RE SUPP: 650.0000 mg | Freq: Four times a day (QID) | RECTAL | Status: DC | PRN

## 2024-06-11 MED ORDER — MENTHOL 3 MG MT LOZG: 1.0000 | LOZENGE | OROMUCOSAL | Status: DC | PRN

## 2024-06-11 MED ORDER — MAGNESIUM SULFATE 4 GM/100ML IV SOLN
4.0000 g | Freq: Once | INTRAVENOUS | Status: AC
  Administered 2024-06-11: 4 g via INTRAVENOUS
  Filled 2024-06-11: qty 100

## 2024-06-11 MED ORDER — MAGIC MOUTHWASH: 15.0000 mL | Freq: Four times a day (QID) | ORAL | Status: DC | PRN

## 2024-06-11 MED ORDER — PHENOL 1.4 % MT LIQD: 2.0000 | OROMUCOSAL | Status: DC | PRN

## 2024-06-11 MED ORDER — ACETAMINOPHEN 325 MG PO TABS
325.0000 mg | ORAL_TABLET | Freq: Four times a day (QID) | ORAL | Status: DC | PRN
  Administered 2024-06-15: 325 mg via ORAL
  Administered 2024-06-17: 650 mg via ORAL
  Filled 2024-06-11 (×2): qty 2

## 2024-06-11 MED ORDER — LOPERAMIDE HCL 2 MG PO CAPS
4.0000 mg | ORAL_CAPSULE | Freq: Three times a day (TID) | ORAL | Status: DC | PRN
  Administered 2024-06-16 – 2024-06-25 (×3): 4 mg via ORAL
  Filled 2024-06-11: qty 2

## 2024-06-11 MED ORDER — NAPHAZOLINE-GLYCERIN 0.012-0.25 % OP SOLN: 1.0000 [drp] | Freq: Four times a day (QID) | OPHTHALMIC | Status: DC | PRN

## 2024-06-11 MED ORDER — ONDANSETRON HCL 4 MG/2ML IJ SOLN
4.0000 mg | Freq: Four times a day (QID) | INTRAMUSCULAR | Status: DC | PRN
  Administered 2024-06-17 – 2024-06-24 (×7): 4 mg via INTRAVENOUS
  Filled 2024-06-11 (×4): qty 2

## 2024-06-11 MED ORDER — METHOCARBAMOL 1000 MG/10ML IJ SOLN: 1000.0000 mg | Freq: Four times a day (QID) | INTRAMUSCULAR | Status: DC | PRN

## 2024-06-11 MED ORDER — ALUM & MAG HYDROXIDE-SIMETH 200-200-20 MG/5ML PO SUSP: 30.0000 mL | Freq: Four times a day (QID) | ORAL | Status: DC | PRN

## 2024-06-11 MED ORDER — BISMUTH SUBSALICYLATE 262 MG/15ML PO SUSP: 30.0000 mL | Freq: Three times a day (TID) | ORAL | Status: DC | PRN

## 2024-06-11 MED ORDER — IOHEXOL 300 MG/ML  SOLN
100.0000 mL | Freq: Once | INTRAMUSCULAR | Status: AC | PRN
  Administered 2024-06-11: 100 mL via INTRAVENOUS

## 2024-06-11 MED ORDER — NYSTATIN 100000 UNIT/ML MT SUSP
5.0000 mL | Freq: Four times a day (QID) | OROMUCOSAL | Status: DC
  Administered 2024-06-11 – 2024-06-12 (×3): 500000 [IU] via ORAL
  Filled 2024-06-11 (×3): qty 5

## 2024-06-11 MED ORDER — LACTATED RINGERS IV SOLN: INTRAVENOUS | Status: DC

## 2024-06-11 MED ORDER — SACCHAROMYCES BOULARDII 250 MG PO CAPS
250.0000 mg | ORAL_CAPSULE | Freq: Two times a day (BID) | ORAL | Status: DC
  Administered 2024-06-11: 250 mg via ORAL
  Filled 2024-06-11: qty 1

## 2024-06-11 MED ORDER — PROCHLORPERAZINE EDISYLATE 10 MG/2ML IJ SOLN
5.0000 mg | INTRAMUSCULAR | Status: DC | PRN
  Administered 2024-06-24: 10 mg via INTRAVENOUS

## 2024-06-11 MED ORDER — LACTATED RINGERS IV BOLUS: 1000.0000 mL | Freq: Three times a day (TID) | INTRAVENOUS | Status: DC | PRN

## 2024-06-11 MED ORDER — POTASSIUM CHLORIDE CRYS ER 20 MEQ PO TBCR
40.0000 meq | EXTENDED_RELEASE_TABLET | Freq: Once | ORAL | Status: AC
  Administered 2024-06-11: 40 meq via ORAL
  Filled 2024-06-11: qty 2

## 2024-06-11 MED ORDER — SALINE SPRAY 0.65 % NA SOLN: 1.0000 | Freq: Four times a day (QID) | NASAL | Status: DC | PRN

## 2024-06-11 MED ORDER — LACTATED RINGERS IV BOLUS
1000.0000 mL | Freq: Once | INTRAVENOUS | Status: AC
  Administered 2024-06-11: 1000 mL via INTRAVENOUS

## 2024-06-11 MED ORDER — SIMETHICONE 40 MG/0.6ML PO SUSP: 80.0000 mg | Freq: Four times a day (QID) | ORAL | Status: DC | PRN

## 2024-06-11 NOTE — Plan of Care (Signed)
  Problem: Education: Goal: Knowledge of General Education information will improve Description: Including pain rating scale, medication(s)/side effects and non-pharmacologic comfort measures Outcome: Progressing   Problem: Activity: Goal: Risk for activity intolerance will decrease Outcome: Progressing   Problem: Safety: Goal: Ability to remain free from injury will improve Outcome: Progressing   Problem: Nutrition: Goal: Adequate nutrition will be maintained Outcome: Not Progressing   Problem: Coping: Goal: Level of anxiety will decrease Outcome: Not Progressing

## 2024-06-11 NOTE — Consult Note (Signed)
 Initial Consultation Note   Patient: Beverly [name] FMW:[id number] DOB: 03/12/1981 PCP: Melvenia Corean SAILOR, NP DOA: 06/05/2024 DOS: the patient was seen and examined on 06/11/2024 Primary service: Kristopher Md, MD  Referring physician: Dr. Sheldon Reason for consult: Nausea vomiting diarrhea  Assessment/Plan:  1. Nausea vomiting diarrhea/abdominal pain: Symptoms have been ongoing for several days according to patient.  She was noted to be on multiple laxative agents and on Colace.  All of these agents have been discontinued.  It does not appear that she has received any laxatives recently.  Etiology for her diarrhea is not entirely clear.  No recent abdominal imaging studies noted.  Last labs are from 2 days ago which showed leukopenia and mildly elevated creatinine.  Hyponatremia hypokalemia also noted.  We will proceed with stat labs for now.  Also proceed with CT scan of the abdomen pelvis.  She also mentions dyspnea so we will proceed with CT scan of the chest also.  She has been in the hospital for 5 to 6 days and has not been on antibiotics.  Unsure of the utility of doing stool studies at this time.  She is however immunocompromise.  Will first see what the CT scan shows and then decide accordingly.  2. Dysphagia: Etiology unclear.  She does have history of HIV.  Differential diagnosis is broad.  She has never had endoscopy previously.  There is some whitish plaques in her mouth raising concern for candidiasis.  For now we will place her on nystatin  and then depending on CT scan we may need to escalate to Diflucan  and involve gastroenterology for upper endoscopy.  3. Abdominal wound/squamous cell carcinoma abdominal wall: She is status post excision of abdominal wound which was positive for squamous cell carcinoma.  Subsequently developed wound infection and underwent I&D.  Wound cultures grew MRSA and Pseudomonas however since most of the infection was surgically removed it was felt by surgery team  that antibiotics were not necessary.  She has not been on antibiotics.  Will follow-up on CT scan results and blood work.  May need to involve infectious disease depending on the results of these tests.  4.  HIV/AIDS: She is on Biktarvy  and atovaquone .  She said that she has been compliant with her medications.  Will check CD4 counts.  May need to involve ID as discussed above.  5.  Dizziness/borderline low blood pressure: Likely hypovolemic due to her GI loss.  Also has had poor oral intake.  Will give her IV fluid bolus followed by maintenance IV fluids.  She is not on any blood pressure lowering agents at this time.  Monitor blood pressures closely.   [facility] will continue to follow the patient.   HPI: Beverly [name] is a 43 y.o. 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  She was discharged home after that surgical procedure.  She presented to the general surgeons office on 12/15 with complaints of increased pain in her wound and persistent drainage from the wound.  She was directly admitted to the hospital.  There was concern for wound infection.  She underwent incision and drainage of the abdominal wall wound on 12/15.  Based on microbiology it looks like she grew out MRSA and Pseudomonas from the wound cultures.  Because of adequate drainage and clean wound postoperatively she was not continued on antibiotics.  She was given vancomycin  for surgical prophylaxis.  Plan was for wound care  education and then subsequently discharge home.  Hospitalist service called for consultation since patient started complaining of nausea vomiting dizziness and loose stools over the last 2 to 3 days.  She is very frustrated by her symptoms which have been ongoing for several days and she feels that nobody has addressed them.  She also complains of some difficulty swallowing at times.  She has never had the symptoms before.  She wonders if it  is related to her surgery.  Her abdominal pain is 8 out of 10 in intensity.  She has had some loose stool but not quite watery.  Has seen some blood in the stool occasionally.  She has felt dizzy but has not had any passing out episodes.  According to nursing staff her blood pressure has been somewhat low over the last 24 to 48 hours.  Review of Systems: As mentioned in the history of present illness. All other systems reviewed and are negative.  Past Medical History:  Diagnosis Date   Abscess    AIDS (HCC) 03/19/2015   Anemia    Anxiety    Arthritis    Asthma    Blood transfusion without reported diagnosis    Depression    HIV infection (HCC)    Homeless 03/19/2015   states stays with family or friends. Does not rent or own a home, but does not live on streets.   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   Pneumonia    Skin ulcer (HCC) 05/06/2015   Past Surgical History:  Procedure Laterality Date   BIOPSY OF SKIN SUBCUTANEOUS TISSUE AND/OR MUCOUS MEMBRANE N/A 02/14/2024   Procedure: Incisional Biopsy of Abdominal Wall Wound;  Surgeon: Curvin Deward MOULD, MD;  Location: [facility];  Service: General;  Laterality: N/A;   CERVICAL CONIZATION W/BX N/A 04/14/2018   Procedure: COLD KNIFE CONIZATION CERVIX WITH BIOPSY;  Surgeon: Anitra Freddy NOVAK, MD;  Location: [facility];  Service: Gynecology;  Laterality: N/A;   CESAREAN SECTION     DILATION AND CURETTAGE OF UTERUS N/A 04/14/2018   Procedure: ENDOCERVICAL CURETTAGE;  Surgeon: Anitra Freddy NOVAK, MD;  Location: [facility];  Service: Gynecology;  Laterality: N/A;   EXAM UNDER ANESTHESIA, PELVIC N/A 05/31/2024   Procedure: EXAM UNDER ANESTHESIA, PELVIC;  Surgeon: Rogelio Planas, MD;  Location: [facility];  Service: Gynecology;  Laterality: N/A;   EXCISION MASS ABDOMINAL N/A 05/24/2024   Procedure:  EXCISION, MASS, TORSO;  Surgeon: Curvin Deward MOULD, MD;  Location: [facility];  Service: General;  Laterality: N/A;  EXCISION SQUAMOUS ELL CANCER LOWER ABDOMINAL WALL   INCISION AND DRAINAGE ABSCESS Right 01/10/2016   Procedure: INCISION AND DRAINAGE RIGHT BUTTOCK, LEFT LABIAL , EXCISION AND DRAINAGE OF  RIGHT AXILLARY ABSCESS;  Surgeon: Morene Olives, MD;  Location: [facility];  Service: General;  Laterality: Right;   INCISION AND DRAINAGE ABSCESS Bilateral 06/05/2024   Procedure: INCISION AND DRAINAGE OF ABDOMINAL WALL ASBCESS;  Surgeon: Dasie Leonor CROME, MD;  Location: [facility];  Service: General;  Laterality: Bilateral;   LEEP  05/31/2024   Procedure: LEEP (LOOP ELECTROSURGICAL EXCISION PROCEDURE);  Surgeon: Rogelio Planas, MD;  Location: [facility];  Service: Gynecology;;   TUBAL LIGATION  2005   VULVA MARYBETH BIOPSY N/A 04/14/2018   Procedure: VULVAR BIOPSIES;  Surgeon: Anitra Freddy NOVAK, MD;  Location: [facility];  Service: Gynecology;  Laterality:  N/A;   Social History:  reports that she has been smoking cigarettes. She started smoking about 10 years ago. She has a 2.7 pack-year smoking history. She has never used smokeless tobacco. She reports current alcohol use. She reports that she does not currently use drugs after having used the following drugs: Marijuana and MDMA (Ecstacy). Frequency: 2.00 times per week.  Allergies[1]  Family History  Problem Relation Age of Onset   Throat cancer Mother        smoker   Throat cancer Father        smoker   Hypertension Father    Cirrhosis Father    Hypertension Other    Cancer Other        smoker   Diabetes Other    Asthma Other    Colon cancer Neg Hx    Breast cancer Neg Hx    Ovarian cancer Neg Hx    Pancreatic cancer Neg Hx    Prostate cancer Neg Hx     Prior to Admission medications  Medication Sig Start Date End Date Taking? Authorizing Provider  acetaminophen  (TYLENOL ) 325 MG tablet Take 2 tablets (650 mg total) by  mouth every 6 (six) hours as needed for mild pain, fever or headache. Patient taking differently: Take 325-650 mg by mouth daily as needed for mild pain (pain score 1-3), headache or moderate pain (pain score 4-6). 12/24/22  Yes Danton Reyes DASEN, MD  atovaquone  (MEPRON ) 750 MG/5ML suspension Take 10 mLs (1,500 mg total) by mouth daily. 03/16/24  Yes Melvenia Corean SAILOR, NP  bictegravir-emtricitabine -tenofovir  AF (BIKTARVY ) 50-200-25 MG TABS tablet Take 1 tablet by mouth daily. 02/22/24  Yes Dennise Kingsley, MD  diphenhydrAMINE  HCl (BENADRYL  ALLERGY PO) Take 25 mg by mouth daily as needed (allergies).   Yes [provider]  gabapentin  (NEURONTIN ) 100 MG capsule Take 100 mg by mouth 3 (three) times daily. 05/29/24 05/29/25 Yes [provider]  lamoTRIgine  (LAMICTAL ) 25 MG tablet Take 25 mg by mouth every morning. 05/23/24  Yes [provider]  ondansetron  (ZOFRAN ) 4 MG tablet Take 1 tablet (4 mg total) by mouth every 6 (six) hours as needed for nausea. Patient taking differently: Take 4 mg by mouth daily as needed for nausea or vomiting. 02/16/24  Yes Pokhrel, Laxman, MD  oxyCODONE  (ROXICODONE ) 5 MG immediate release tablet Take 1 tablet (5 mg total) by mouth every 8 (eight) hours as needed. Patient taking differently: Take 5 mg by mouth every 6 (six) hours as needed for moderate pain (pain score 4-6) or severe pain (pain score 7-10). 05/24/24 05/24/25 Yes Curvin Mt III, MD  pregabalin  (LYRICA ) 25 MG capsule Take 1 capsule (25 mg total) by mouth 2 (two) times daily. 03/16/24  Yes Melvenia Corean SAILOR, NP  triamcinolone  ointment (KENALOG ) 0.1 % Apply 1 Application topically 2 (two) times daily. Patient taking differently: Apply 1 Application topically daily. 05/22/24  Yes Eldonna Mays, MD  albuterol  (VENTOLIN  HFA) 108 [phone] Base) MCG/ACT inhaler Inhale 2 puffs into the lungs every 6 (six) hours as needed for wheezing or shortness of breath. Patient not taking: Reported on 06/07/2024     [provider]  ATROVENT  HFA 17 MCG/ACT inhaler Inhale 2 puffs into the lungs every 6 (six) hours as needed for wheezing. Patient not taking: Reported on 06/07/2024    [provider]  pantoprazole  (PROTONIX ) 40 MG tablet Take 1 tablet (40 mg total) by mouth daily. Patient not taking: Reported on 06/07/2024 02/17/24 02/16/25  Sonjia Held, MD  Physical Exam: Vitals:   06/10/24 1418 06/10/24 2119 06/11/24 0544 06/11/24 1205  BP: 114/80 112/80 115/81 99/74  Pulse: (!) 102 (!) 104 100 71  Resp: 18 16 16 18   Temp: 98.6 F (37 C) 98.8 F (37.1 C) 99.2 F (37.3 C) 99.8 F (37.7 C)  TempSrc: Oral Oral Oral Oral  SpO2: 98% 100% 100% 98%  Weight:      Height:        General appearance: Awake alert.  In no distress.  In some discomfort. No pallor or icterus.  Extraocular movements are normal. Whitish plaques noted inside the oral cavity. Neck soft and supple. Resp: Clear to auscultation bilaterally.  Normal effort Cardio: S1-S2 is tachycardic regular.  No S3-S4.  No rubs murmurs or bruit GI: Dressing noted over the lower abdomen.  Abdomen is soft but very tender diffusely.  No rebound rigidity or guarding appreciated.  Bowel sounds sluggish but present.  No masses organomegaly.  Dressing was not removed since this is being addressed by general surgery on a daily basis.   Extremities: No edema.  Full range of motion of lower extremities. Neurologic: Alert and oriented x3.  No focal neurological deficits.   Data Reviewed:  All labs are pending from today  Family Communication: Discussed with patient  Thank you very much for involving us  in the care of your patient.  Author: Joette Pebbles, MD 06/11/2024 3:06 PM  For on call review [email or web address].     [1]  Allergies Allergen Reactions   Bee Venom Anaphylaxis, Shortness Of Breath and Swelling   Morphine  Hives, Itching and Other (See Comments)    And- Tolerates hydrocodone , oxycodone , and codeine with no  issues   Ferrlecit  [Na Ferric Gluc Cplx In Sucrose] Swelling and Other (See Comments)    Swelling and puffiness in hands and legs after Ferrlecit  250 mg infused over 60 minutes (possibly infusion related reaction)   Latex Hives

## 2024-06-11 NOTE — Progress Notes (Signed)
 Note: Portions of this report may have been transcribed using voice recognition software. A sincere effort was made to ensure accuracy; however, inadvertent computerized transcription errors may be present.   Any transcriptional errors that result from this process are unintentional.        Beverly [name]  [birth year] [id number]  Patient Care Team: Melvenia Corean SAILOR, NP as PCP - General (Infectious Diseases) Comer, Lamar ORN, MD (Inactive) as PCP - Infectious Diseases (Infectious Diseases) Comer, Lamar ORN, MD (Inactive) (Internal Medicine) Melvenia Corean SAILOR, NP as Nurse Practitioner (Infectious Diseases)  Called by nurse earlier today with patient claiming to had nausea vomiting diarrhea for several days.  Surgery not aware.  Question of sick contacts and possible cold or flu.  I wrote for IV fluids with some nausea and pain control.  TRH consultation requested.  Hypomagnesia being corrected.  Electrolytes not particular abnormal aside from some mild hyponatremia.  Patient remains malnourished.  Has persistent leukopenia and chronic anemia  CT scan just shows wounds on abdominal wall and labia consistent with recent surgery resections of foci of squamous cell carcinoma etc.  No intra-abdominal evidence of any appendicitis, perforation, obstruction, ileus, diverticulitis, incarcerated hernia, or other surgical concerns.  No ileitis or gastritis obvious.  Appreciate medical help.  Hopefully symptoms will improve.  Continue workup per the recommendations.  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   Nonintractable headache    Generalized lymphadenopathy    Skin rash associated with AIDS (HCC) 08/27/2020   Cellulitis of abdominal wall    HIV disease (HCC)    Weight loss    Nausea & vomiting 05/25/2018   Abdominal wall skin ulcer 03/10/2018   Itching 03/10/2018   History of 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/11/2016   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   Non-healing skin lesion 05/06/2015   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   Hypokalemia 08/13/2014   Depression 10/17/2013   Anemia 10/17/2013   Cigarette smoker 10/17/2013   Asthma 10/17/2013   Encounter for general adult medical examination without abnormal findings 06/12/2011   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 Does not rent or own a home, but does not live on streets.   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   Pneumonia    Skin ulcer (HCC) 05/06/2015    Past Surgical History:  Procedure Laterality Date   BIOPSY OF SKIN SUBCUTANEOUS TISSUE AND/OR MUCOUS MEMBRANE N/A 02/14/2024  Procedure: Incisional Biopsy of Abdominal Wall Wound;  Surgeon: Curvin Deward MOULD, MD;  Location: [facility];  Service: General;  Laterality: N/A;   CERVICAL CONIZATION W/BX N/A 04/14/2018   Procedure: COLD KNIFE CONIZATION CERVIX WITH BIOPSY;  Surgeon: Anitra Freddy NOVAK, MD;  Location: [facility] [place];  Service: Gynecology;   Laterality: N/A;   CESAREAN SECTION     DILATION AND CURETTAGE OF UTERUS N/A 04/14/2018   Procedure: ENDOCERVICAL CURETTAGE;  Surgeon: Anitra Freddy NOVAK, MD;  Location: [facility];  Service: Gynecology;  Laterality: N/A;   EXAM UNDER ANESTHESIA, PELVIC N/A 05/31/2024   Procedure: EXAM UNDER ANESTHESIA, PELVIC;  Surgeon: Rogelio Planas, MD;  Location: [facility];  Service: Gynecology;  Laterality: N/A;   EXCISION MASS ABDOMINAL N/A 05/24/2024   Procedure: EXCISION, MASS, TORSO;  Surgeon: Curvin Deward MOULD, MD;  Location: [facility];  Service: General;  Laterality: N/A;  EXCISION SQUAMOUS ELL CANCER LOWER ABDOMINAL WALL   INCISION AND DRAINAGE ABSCESS Right 01/10/2016   Procedure: INCISION AND DRAINAGE RIGHT BUTTOCK, LEFT LABIAL , EXCISION AND DRAINAGE OF  RIGHT AXILLARY ABSCESS;  Surgeon: Morene Olives, MD;  Location: [facility];  Service: General;  Laterality: Right;   INCISION AND DRAINAGE ABSCESS Bilateral 06/05/2024   Procedure: INCISION AND DRAINAGE OF ABDOMINAL WALL ASBCESS;  Surgeon: Dasie Leonor CROME, MD;  Location: [facility];  Service: General;  Laterality: Bilateral;   LEEP  05/31/2024   Procedure: LEEP (LOOP ELECTROSURGICAL EXCISION PROCEDURE);  Surgeon: Rogelio Planas, MD;  Location: [facility];  Service: Gynecology;;   TUBAL LIGATION  2005   VULVA MARYBETH BIOPSY N/A 04/14/2018   Procedure: VULVAR BIOPSIES;  Surgeon: Anitra Freddy NOVAK, MD;  Location: [facility];  Service: Gynecology;  Laterality: N/A;    Social History   Socioeconomic History   Marital status: Single    Spouse name: Not on file   Number of children: 4   Years of education: Not on file   Highest education level: Not on file  Occupational History   Occupation: Production Designer, Theatre/television/film at [employer]  Tobacco Use   Smoking status: Every Day    Current packs/day: 0.25    Average packs/day: 0.3 packs/day for 11.0 years (2.7 ttl pk-yrs)    Types: Cigarettes    Start date: 2015   Smokeless tobacco: Never    Tobacco comments:    5-7 a day  Vaping Use   Vaping status: Never Used  Substance and Sexual Activity   Alcohol use: Yes    Alcohol/week: 0.0 standard drinks of alcohol    Comment: Ocassionally   Drug use: Not Currently    Frequency: 2.0 times per week    Types: Marijuana, MDMA (Ecstacy)   Sexual activity: Not Currently    Partners: Male    Birth control/protection: Condom, Surgical    Comment: pt. given condoms  Other Topics Concern   Not on file  Social History Narrative   Not on file   Social Drivers of Health   Tobacco Use: High Risk (06/05/2024)   Received from [facility]   Patient History    Smoking Tobacco Use: Every Day    Smokeless Tobacco Use: Never    Passive Exposure: Not on file  Financial Resource Strain: Not on file  Food Insecurity: Food Insecurity Present (06/05/2024)   Epic    Worried About Programme Researcher, Broadcasting/film/video in the Last Year: Often true    [employer] of Food in the Last Year: Often true  Transportation  Needs: No Transportation Needs (06/05/2024)   Epic    Lack of Transportation (Medical): No    Lack of Transportation (Non-Medical): No  Physical Activity: Not on file  Stress: Not on file  Social Connections: Not on file  Intimate Partner Violence: Not At Risk (06/05/2024)   Epic    Fear of Current or Ex-Partner: No    Emotionally Abused: No    Physically Abused: No    Sexually Abused: No  Depression (PHQ2-9): Medium Risk (04/06/2024)   Depression (PHQ2-9)    PHQ-2 Score: 8  Alcohol Screen: Not on file  Housing: High Risk (06/05/2024)   Epic    Unable to Pay for Housing in the Last Year: Yes    Number of Times Moved in the Last Year: 3    Homeless in the Last Year: Yes  Utilities: At Risk (06/05/2024)   Epic    Threatened with loss of utilities: Yes  Health Literacy: Not on file    Family History  Problem Relation Age of Onset   Throat cancer Mother        smoker   Throat cancer Father        smoker   Hypertension  Father    Cirrhosis Father    Hypertension Other    Cancer Other        smoker   Diabetes Other    Asthma Other    Colon cancer Neg Hx    Breast cancer Neg Hx    Ovarian cancer Neg Hx    Pancreatic cancer Neg Hx    Prostate cancer Neg Hx     Current Facility-Administered Medications  Medication Dose Route Frequency Provider Last Rate Last Admin   acetaminophen  (TYLENOL ) suppository 650 mg  650 mg Rectal Q6H PRN Sheldon Standing, MD       acetaminophen  (TYLENOL ) tablet 1,000 mg  1,000 mg Oral Q6H Dasie Best L, MD   1,000 mg at 06/11/24 1801   acetaminophen  (TYLENOL ) tablet 325-650 mg  325-650 mg Oral Q6H PRN Sheldon Standing, MD       albuterol  (PROVENTIL ) (2.5 MG/3ML) 0.083% nebulizer solution 3 mL  3 mL Inhalation Q6H PRN Dasie Best CROME, MD       alum & mag hydroxide-simeth (MAALOX/MYLANTA) 200-200-20 MG/5ML suspension 30 mL  30 mL Oral Q6H PRN Sheldon Standing, MD       atovaquone  (MEPRON ) 750 MG/5ML suspension 1,500 mg  1,500 mg Oral Daily Dasie Best L, MD   1,500 mg at 06/11/24 1144   bictegravir-emtricitabine -tenofovir  AF (BIKTARVY ) 50-200-25 MG per tablet 1 tablet  1 tablet Oral Daily Dasie Best CROME, MD   1 tablet at 06/11/24 1144   diphenhydrAMINE  (BENADRYL ) 12.5 MG/5ML elixir 12.5 mg  12.5 mg Oral Q6H PRN Dasie Best CROME, MD       Or   diphenhydrAMINE  (BENADRYL ) injection 12.5 mg  12.5 mg Intravenous Q6H PRN Dasie Best CROME, MD       enoxaparin  (LOVENOX ) injection 40 mg  40 mg Subcutaneous Q24H Dasie Best CROME, MD       gabapentin  (NEURONTIN ) capsule 100 mg  100 mg Oral TID Johnson, Kelly R, PA-C   100 mg at 06/11/24 1623   HYDROmorphone  (DILAUDID ) injection 0.5-1 mg  0.5-1 mg Intravenous Q4H PRN Tammy Sor, PA-C   1 mg at 06/08/24 1350   lactated ringers  infusion   Intravenous Continuous Krishnan, Gokul, MD   Stopped at 06/11/24 1800   lamoTRIgine  (LAMICTAL ) tablet 25 mg  25 mg Oral q  morning Johnson, Kelly R, PA-C   25 mg at 06/11/24 1144   loperamide  (IMODIUM ) capsule 4  mg  4 mg Oral TID PRN Krishnan, Gokul, MD       LORazepam  (ATIVAN ) injection 0.5 mg  0.5 mg Intravenous Q6H PRN Tammy Sor, PA-C   0.5 mg at 06/11/24 1846   melatonin tablet 3 mg  3 mg Oral QHS PRN Dasie Leonor CROME, MD   3 mg at 06/10/24 2005   menthol  (CEPACOL) lozenge 3 mg  1 lozenge Oral PRN Sheldon Standing, MD       methocarbamol  (ROBAXIN ) injection 1,000 mg  1,000 mg Intravenous Q6H PRN Sheldon Standing, MD       methocarbamol  (ROBAXIN ) tablet 500 mg  500 mg Oral QID Dasie Leonor CROME, MD   500 mg at 06/11/24 1801   naphazoline-glycerin  (CLEAR EYES REDNESS) ophth solution 1-2 drop  1-2 drop Both Eyes QID PRN Sheldon Standing, MD       nystatin  (MYCOSTATIN ) 100000 UNIT/ML suspension 500,000 Units  5 mL Oral QID Krishnan, Gokul, MD   500,000 Units at 06/11/24 1801   ondansetron  (ZOFRAN ) injection 4 mg  4 mg Intravenous Q6H PRN Sheldon Standing, MD       ondansetron  (ZOFRAN -ODT) disintegrating tablet 4 mg  4 mg Oral Q6H PRN Dasie Leonor CROME, MD   4 mg at 06/09/24 0745   oxyCODONE  (Oxy IR/ROXICODONE ) immediate release tablet 10-15 mg  10-15 mg Oral Q4H PRN Johnson, Kelly R, PA-C   15 mg at 06/11/24 1623   pantoprazole  (PROTONIX ) EC tablet 40 mg  40 mg Oral Daily Dasie Leonor CROME, MD   40 mg at 06/11/24 1144   phenol (CHLORASEPTIC) mouth spray 2 spray  2 spray Mouth/Throat PRN Sheldon Standing, MD       pregabalin  (LYRICA ) capsule 25 mg  25 mg Oral BID Dasie Leonor CROME, MD   25 mg at 06/11/24 1144   prochlorperazine  (COMPAZINE ) injection 5-10 mg  5-10 mg Intravenous Q4H PRN Sheldon Standing, MD       saccharomyces boulardii (FLORASTOR) capsule 250 mg  250 mg Oral BID Krishnan, Gokul, MD       sodium chloride  (OCEAN) 0.65 % nasal spray 1-2 spray  1-2 spray Each Nare Q6H PRN Sheldon Standing, MD         Allergies[1]  BP 99/74 (BP Location: Left Arm)   Pulse 71   Temp 99.8 F (37.7 C) (Oral)   Resp 18   Ht 5' 1 (1.549 m)   Wt 53 kg   SpO2 98%   BMI 22.08 kg/m   CT CHEST ABDOMEN PELVIS W CONTRAST Result  Date: 06/11/2024 CLINICAL DATA:  Shortness of breath.  Abdominal pain. EXAM: CT CHEST, ABDOMEN, AND PELVIS WITH CONTRAST TECHNIQUE: Multidetector CT imaging of the chest, abdomen and pelvis was performed following the standard protocol during bolus administration of intravenous contrast. RADIATION DOSE REDUCTION: This exam was performed according to the departmental dose-optimization program which includes automated exposure control, adjustment of the mA and/or kV according to patient size and/or use of iterative reconstruction technique. CONTRAST:  OMNIPAQUE  IOHEXOL  300 MG/ML  SOLN COMPARISON:  Chest radiograph dated 03/01/2024. CT abdomen pelvis dated 02/12/2024. FINDINGS: CT CHEST FINDINGS Cardiovascular: There is no cardiomegaly or pericardial effusion. The thoracic aorta is unremarkable. The origins of the great vessels of the aortic arch and the central pulmonary arteries are patent. Mediastinum/Nodes: No hilar or mediastinal adenopathy. The esophagus is grossly unremarkable. No mediastinal fluid collection. Lungs/Pleura: No focal consolidation,  pleural effusion or pneumothorax. The central airways are patent. Musculoskeletal: No acute osseous pathology. CT ABDOMEN PELVIS FINDINGS No intra-abdominal free air or free fluid. Hepatobiliary: The liver is unremarkable. No biliary ductal dilatation. The gallbladder is unremarkable. Pancreas: Unremarkable. No pancreatic ductal dilatation or surrounding inflammatory changes. Spleen: Normal in size without focal abnormality. Adrenals/Urinary Tract: The adrenal glands unremarkable. Small nonobstructing right renal calculi measure up to 5 mm in the interpolar right kidney. No hydronephrosis. The left kidney is unremarkable. The visualized ureters and urinary bladder appear unremarkable. Stomach/Bowel: There is no bowel obstruction or active inflammation. The appendix is normal. Vascular/Lymphatic: The abdominal aorta and IVC unremarkable. No portal venous gas.  There is no adenopathy. Reproductive: Inflammatory changes and thickening of the labia and urethral orifice with diffuse stranding and edema in the subcutaneous soft tissues of the anterior pubic wall and perineum. Correlation with clinical exam recommended. No drainable fluid collection or abscess. There is apparent ulceration of the skin with small pockets of soft tissue air in the anterior pubic/labial region. Other: There is ulceration of the subcutaneous soft tissues across the anterior pelvic wall. No drainable fluid collection. Musculoskeletal: No acute osseous pathology. IMPRESSION: 1. No acute intrathoracic pathology. 2. Inflammatory changes and thickening of the labia and urethral orifice with diffuse stranding and edema in the subcutaneous soft tissues of the anterior pubic wall and perineum. 3. Ulceration of the skin of the anterior pubic and labial area. No drainable fluid collection or abscess. 4. Ulceration of the subcutaneous soft tissues across the anterior pelvic wall. No fluid collection. 5. No bowel obstruction. Normal appendix. 6. Small nonobstructing right renal calculi. No hydronephrosis. Electronically Signed   By: Vanetta Chou M.D.   On: 06/11/2024 17:51       [1]  Allergies Allergen Reactions   Bee Venom Anaphylaxis, Shortness Of Breath and Swelling   Morphine  Hives, Itching and Other (See Comments)    And- Tolerates hydrocodone , oxycodone , and codeine with no issues   Ferrlecit  [Na Ferric Gluc Cplx In Sucrose] Swelling and Other (See Comments)    Swelling and puffiness in hands and legs after Ferrlecit  250 mg infused over 60 minutes (possibly infusion related reaction)   Latex Hives

## 2024-06-11 NOTE — Progress Notes (Signed)
 6 Days Post-Op   Subjective/Chief Complaint: Patient tolerating wound care better Her sister was not able to make it to the hospital yesterday to help learn dressing changes   Objective: Vital signs in last 24 hours: Temp:  [98.6 F (37 C)-99.2 F (37.3 C)] 99.2 F (37.3 C) (12/21 0544) Pulse Rate:  [100-104] 100 (12/21 0544) Resp:  [16-18] 16 (12/21 0544) BP: (112-115)/(80-81) 115/81 (12/21 0544) SpO2:  [98 %-100 %] 100 % (12/21 0544) Last BM Date : 06/11/24  Intake/Output from previous day: 12/20 0701 - 12/21 0700 In: 360 [P.O.:360] Out: 100 [Emesis/NG output:100] Intake/Output this shift: No intake/output data recorded.  Exam: Awake and alert Comfortable Abdomen soft, wound stable  Lab Results:  Recent Labs    06/09/24 0327  WBC 3.2*  HGB 9.4*  HCT 30.9*  PLT 519*   BMET Recent Labs    06/09/24 0327  NA 133*  K 3.4*  CL 100  CO2 23  GLUCOSE 102*  BUN 17  CREATININE 1.06*  CALCIUM  8.6*   PT/INR No results for input(s): LABPROT, INR in the last 72 hours. ABG No results for input(s): PHART, HCO3 in the last 72 hours.  Invalid input(s): PCO2, PO2  Studies/Results: No results found.  Anti-infectives: Anti-infectives (From admission, onward)    Start     Dose/Rate Route Frequency Ordered Stop   06/06/24 1000  bictegravir-emtricitabine -tenofovir  AF (BIKTARVY ) 50-200-25 MG per tablet 1 tablet        1 tablet Oral Daily 06/05/24 1927     06/06/24 1000  atovaquone  (MEPRON ) 750 MG/5ML suspension 1,500 mg        1,500 mg Oral Daily 06/05/24 1927     06/05/24 1645  vancomycin  (VANCOCIN ) IVPB 1000 mg/200 mL premix        1,000 mg 200 mL/hr over 60 Minutes Intravenous On call to O.R. 06/05/24 1628 06/05/24 2040   06/05/24 1631  vancomycin  (VANCOCIN ) 1-5 GM/200ML-% IVPB       Note to Pharmacy: [name] W: cabinet override      06/05/24 1631 06/06/24 0444       Assessment/Plan: POD 6, s/p drainage of wound infection by Dr. Dasie  12/15 after s/p excision of SCC by Dr. Curvin   -issue regarding discharge remains wound care and ability of the patient to handle it her self.  Continue wound care teaching and hopefully family can come today to learn wound care and will be available to help pt once discharged  Beverly [name] 06/11/2024

## 2024-06-12 DIAGNOSIS — R509 Fever, unspecified: Secondary | ICD-10-CM | POA: Diagnosis not present

## 2024-06-12 DIAGNOSIS — R8290 Unspecified abnormal findings in urine: Secondary | ICD-10-CM

## 2024-06-12 DIAGNOSIS — B370 Candidal stomatitis: Secondary | ICD-10-CM | POA: Diagnosis not present

## 2024-06-12 DIAGNOSIS — R1319 Other dysphagia: Secondary | ICD-10-CM | POA: Diagnosis not present

## 2024-06-12 DIAGNOSIS — R8271 Bacteriuria: Secondary | ICD-10-CM | POA: Diagnosis not present

## 2024-06-12 DIAGNOSIS — B3781 Candidal esophagitis: Secondary | ICD-10-CM

## 2024-06-12 DIAGNOSIS — R197 Diarrhea, unspecified: Secondary | ICD-10-CM | POA: Diagnosis not present

## 2024-06-12 DIAGNOSIS — E871 Hypo-osmolality and hyponatremia: Secondary | ICD-10-CM

## 2024-06-12 DIAGNOSIS — D72819 Decreased white blood cell count, unspecified: Secondary | ICD-10-CM | POA: Diagnosis not present

## 2024-06-12 DIAGNOSIS — Z79899 Other long term (current) drug therapy: Secondary | ICD-10-CM

## 2024-06-12 DIAGNOSIS — T8149XA Infection following a procedure, other surgical site, initial encounter: Secondary | ICD-10-CM | POA: Diagnosis not present

## 2024-06-12 DIAGNOSIS — A09 Infectious gastroenteritis and colitis, unspecified: Secondary | ICD-10-CM | POA: Diagnosis not present

## 2024-06-12 DIAGNOSIS — A0811 Acute gastroenteropathy due to Norwalk agent: Secondary | ICD-10-CM | POA: Diagnosis not present

## 2024-06-12 DIAGNOSIS — B20 Human immunodeficiency virus [HIV] disease: Secondary | ICD-10-CM | POA: Diagnosis not present

## 2024-06-12 LAB — CBC
HCT: 26.6 % — ABNORMAL LOW (ref 36.0–46.0)
Hemoglobin: 8.2 g/dL — ABNORMAL LOW (ref 12.0–15.0)
MCH: 25 pg — ABNORMAL LOW (ref 26.0–34.0)
MCHC: 30.8 g/dL (ref 30.0–36.0)
MCV: 81.1 fL (ref 80.0–100.0)
Platelets: 403 K/uL — ABNORMAL HIGH (ref 150–400)
RBC: 3.28 MIL/uL — ABNORMAL LOW (ref 3.87–5.11)
RDW: 17.3 % — ABNORMAL HIGH (ref 11.5–15.5)
WBC: 1.8 K/uL — ABNORMAL LOW (ref 4.0–10.5)
nRBC: 0 % (ref 0.0–0.2)

## 2024-06-12 LAB — BASIC METABOLIC PANEL WITH GFR
Anion gap: 10 (ref 5–15)
BUN: 12 mg/dL (ref 6–20)
CO2: 20 mmol/L — ABNORMAL LOW (ref 22–32)
Calcium: 8.4 mg/dL — ABNORMAL LOW (ref 8.9–10.3)
Chloride: 104 mmol/L (ref 98–111)
Creatinine, Ser: 0.84 mg/dL (ref 0.44–1.00)
GFR, Estimated: >60 mL/min
Glucose, Bld: 102 mg/dL — ABNORMAL HIGH (ref 70–99)
Potassium: 3.8 mmol/L (ref 3.5–5.1)
Sodium: 134 mmol/L — ABNORMAL LOW (ref 135–145)

## 2024-06-12 LAB — MAGNESIUM: Magnesium: 2.4 mg/dL (ref 1.7–2.4)

## 2024-06-12 LAB — CD4/CD8 (T-HELPER/T-SUPPRESSOR CELL)
CD4 absolute: <35 /uL — ABNORMAL LOW (ref 400–1790)
CD4%: 1.07 % — ABNORMAL LOW (ref 33–65)
CD8 T Cell Abs: 243 /uL (ref 190–1000)
CD8tox: 43.88 % — ABNORMAL HIGH (ref 12–40)
Ratio: 0.02 — ABNORMAL LOW (ref 1.0–3.0)
Total lymphocyte count: 554 /uL — ABNORMAL LOW (ref 1000–4000)

## 2024-06-12 MED ORDER — LACTATED RINGERS IV SOLN: INTRAVENOUS | Status: AC

## 2024-06-12 MED ORDER — POTASSIUM CHLORIDE CRYS ER 20 MEQ PO TBCR
20.0000 meq | EXTENDED_RELEASE_TABLET | Freq: Once | ORAL | Status: AC
  Administered 2024-06-12: 20 meq via ORAL
  Filled 2024-06-12: qty 1

## 2024-06-12 MED ORDER — FLUCONAZOLE 100 MG PO TABS
200.0000 mg | ORAL_TABLET | Freq: Every day | ORAL | Status: DC
  Administered 2024-06-12 – 2024-06-27 (×14): 200 mg via ORAL
  Filled 2024-06-12 (×8): qty 2

## 2024-06-12 NOTE — H&P (View-Only) (Signed)
 [facility] Consult  Referring Provider: Dr. Joette Hedger hospitalist Primary Care Physician:  Melvenia Corean SAILOR, NP Primary Gastroenterologist: [place]  Reason for Consultation: Dysphagia  HPI: Beverly [name] is a 43 y.o. female with HIV, squamous cell cancer of lower abdomen, excision of lower abdominal wall skin cancer on 05/24/2024, underwent incision and drainage of abdominal wall wound on 06/05/2024 with culture showing MRSA and Pseudomonas aeruginosa.  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  Patient states that for the last 4 days she has developed severe diarrhea, described as 6-7 loose bowel movements associated with urgency and fecal accidents along with nausea and vomiting for the past 3 days.  She has also been experiencing difficulty swallowing solids as well as liquids which are new for her.  She has acid reflux and heartburn.  She also complains of lower abdominal pain.  She reports about 5 pound weight loss in the last several weeks.  No prior EGD or colonoscopy.  Past Medical History:  Diagnosis Date   Abscess    AIDS (HCC) 03/19/2015   Anemia    Anxiety    Arthritis    Asthma    Blood transfusion without reported diagnosis    Depression    HIV infection (HCC)    Homeless 03/19/2015   states stays with family or friends. Does not rent or own a home, but does not live on streets.   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   Pneumonia    Skin ulcer (HCC) 05/06/2015    Past Surgical History:  Procedure Laterality Date   BIOPSY OF SKIN SUBCUTANEOUS TISSUE AND/OR  MUCOUS MEMBRANE N/A 02/14/2024   Procedure: Incisional Biopsy of Abdominal Wall Wound;  Surgeon: Curvin Deward MOULD, MD;  Location: [facility];  Service: General;  Laterality: N/A;   CERVICAL CONIZATION W/BX N/A 04/14/2018   Procedure: COLD KNIFE CONIZATION CERVIX WITH BIOPSY;  Surgeon: Anitra Freddy NOVAK, MD;  Location: [facility];  Service: Gynecology;  Laterality: N/A;   CESAREAN SECTION     DILATION AND CURETTAGE OF UTERUS N/A 04/14/2018   Procedure: ENDOCERVICAL CURETTAGE;  Surgeon: Anitra Freddy NOVAK, MD;  Location: [facility];  Service: Gynecology;  Laterality: N/A;   EXAM UNDER ANESTHESIA, PELVIC N/A 05/31/2024   Procedure: EXAM UNDER ANESTHESIA, PELVIC;  Surgeon: Rogelio Planas, MD;  Location: [facility];  Service: Gynecology;  Laterality: N/A;   EXCISION MASS ABDOMINAL N/A 05/24/2024   Procedure: EXCISION, MASS, TORSO;  Surgeon: Curvin Deward MOULD, MD;  Location: [facility];  Service: General;  Laterality: N/A;  EXCISION SQUAMOUS ELL CANCER LOWER ABDOMINAL WALL   INCISION AND DRAINAGE ABSCESS Right 01/10/2016   Procedure: INCISION AND DRAINAGE RIGHT BUTTOCK, LEFT LABIAL , EXCISION AND DRAINAGE OF  RIGHT AXILLARY ABSCESS;  Surgeon: Morene Olives, MD;  Location: [facility];  Service: General;  Laterality: Right;   INCISION AND DRAINAGE ABSCESS Bilateral 06/05/2024   Procedure: INCISION AND DRAINAGE OF ABDOMINAL WALL ASBCESS;  Surgeon: Dasie Leonor LITTIE, MD;  Location: [facility];  Service: General;  Laterality: Bilateral;   LEEP  05/31/2024   Procedure: LEEP (LOOP ELECTROSURGICAL EXCISION PROCEDURE);  Surgeon: Rogelio Planas, MD;  Location: [facility];  Service: Gynecology;;   TUBAL LIGATION  2005   VULVA MARYBETH BIOPSY N/A 04/14/2018   Procedure: VULVAR BIOPSIES;  Surgeon: Anitra Freddy NOVAK, MD;  Location: [facility];  Service: Gynecology;  Laterality: N/A;    Prior to Admission medications  Medication Sig Start Date End Date Taking? Authorizing Provider   acetaminophen  (TYLENOL ) 325 MG tablet Take 2 tablets (650 mg total) by mouth every 6 (six) hours as needed for mild pain, fever or headache. Patient taking differently: Take 325-650 mg by mouth daily as needed for mild pain (pain score 1-3), headache or moderate pain (pain score 4-6). 12/24/22  Yes Danton Reyes DASEN, MD  atovaquone  (MEPRON ) 750 MG/5ML suspension Take 10 mLs (1,500 mg total) by mouth daily. 03/16/24  Yes Melvenia Corean SAILOR, NP  bictegravir-emtricitabine -tenofovir  AF (BIKTARVY ) 50-200-25 MG TABS tablet Take 1 tablet by mouth daily. 02/22/24  Yes Dennise Kingsley, MD  diphenhydrAMINE  HCl (BENADRYL  ALLERGY PO) Take 25 mg by mouth daily as needed (allergies).   Yes [provider]  gabapentin  (NEURONTIN ) 100 MG capsule Take 100 mg by mouth 3 (three) times daily. 05/29/24 05/29/25 Yes [provider]  lamoTRIgine  (LAMICTAL ) 25 MG tablet Take 25 mg by mouth every morning. 05/23/24  Yes [provider]  ondansetron  (ZOFRAN ) 4 MG tablet Take 1 tablet (4 mg total) by mouth every 6 (six) hours as needed for nausea. Patient taking differently: Take 4 mg by mouth daily as needed for nausea or vomiting. 02/16/24  Yes Pokhrel, Laxman, MD  oxyCODONE  (ROXICODONE ) 5 MG immediate release tablet Take 1 tablet (5 mg total) by mouth every 8 (eight) hours as needed. Patient taking differently: Take 5 mg by mouth every 6 (six) hours as needed for moderate pain (pain score 4-6) or severe pain (pain score 7-10). 05/24/24 05/24/25 Yes Curvin Deward MOULD, MD  pregabalin  (LYRICA ) 25 MG capsule Take 1 capsule (25 mg total) by mouth 2 (two) times daily. 03/16/24  Yes Melvenia Corean SAILOR, NP  triamcinolone  ointment (KENALOG ) 0.1 % Apply 1 Application topically 2 (two) times daily. Patient taking differently: Apply 1 Application topically daily. 05/22/24  Yes Eldonna Mays, MD  albuterol  (VENTOLIN  HFA) [phone] Base) MCG/ACT inhaler Inhale 2 puffs into the lungs every 6 (six) hours as needed for wheezing  or shortness of breath. Patient not taking: Reported on 06/07/2024    [provider]  ATROVENT  HFA 17 MCG/ACT inhaler Inhale 2 puffs into the lungs every 6 (six) hours as needed for wheezing. Patient not taking: Reported on 06/07/2024    [provider]  pantoprazole  (PROTONIX ) 40 MG tablet Take 1 tablet (40 mg total) by mouth daily. Patient not taking: Reported on 06/07/2024 02/17/24 02/16/25  Sonjia Held, MD    Current Facility-Administered Medications  Medication Dose Route Frequency Provider Last Rate Last Admin   acetaminophen  (TYLENOL ) suppository 650 mg  650 mg Rectal Q6H PRN Sheldon Standing, MD       acetaminophen  (TYLENOL ) tablet 1,000 mg  1,000 mg Oral Q6H Dasie Best L, MD   1,000 mg at 06/11/24 1801   acetaminophen  (TYLENOL ) tablet 325-650 mg  325-650 mg Oral Q6H PRN Sheldon Standing, MD       albuterol  (PROVENTIL ) (2.5 MG/3ML) 0.083% nebulizer solution 3 mL  3 mL Inhalation Q6H PRN Dasie Best CROME, MD       alum & mag hydroxide-simeth (MAALOX/MYLANTA) 200-200-20 MG/5ML suspension 30 mL  30 mL Oral Q6H PRN Sheldon Standing, MD       atovaquone  (MEPRON ) 750  MG/5ML suspension 1,500 mg  1,500 mg Oral Daily Dasie Leonor CROME, MD   1,500 mg at 06/11/24 1144   bictegravir-emtricitabine -tenofovir  AF (BIKTARVY ) 50-200-25 MG per tablet 1 tablet  1 tablet Oral Daily Dasie Leonor CROME, MD   1 tablet at 06/11/24 1144   diphenhydrAMINE  (BENADRYL ) 12.5 MG/5ML elixir 12.5 mg  12.5 mg Oral Q6H PRN Dasie Leonor CROME, MD       Or   diphenhydrAMINE  (BENADRYL ) injection 12.5 mg  12.5 mg Intravenous Q6H PRN Dasie Leonor CROME, MD       enoxaparin  (LOVENOX ) injection 40 mg  40 mg Subcutaneous Q24H Dasie Leonor CROME, MD       gabapentin  (NEURONTIN ) capsule 100 mg  100 mg Oral TID Johnson, Kelly R, PA-C   100 mg at 06/11/24 2206   HYDROmorphone  (DILAUDID ) injection 0.5-1 mg  0.5-1 mg Intravenous Q4H PRN Tammy Sor, PA-C   1 mg at 06/08/24 1350   lactated ringers  infusion   Intravenous Continuous  Krishnan, Gokul, MD 100 mL/hr at 06/12/24 0623 New Bag at 06/12/24 9376   lamoTRIgine  (LAMICTAL ) tablet 25 mg  25 mg Oral q morning Vicci Sor SAUNDERS, PA-C   25 mg at 06/11/24 1144   loperamide  (IMODIUM ) capsule 4 mg  4 mg Oral TID PRN Krishnan, Gokul, MD       LORazepam  (ATIVAN ) injection 0.5 mg  0.5 mg Intravenous Q6H PRN Tammy Sor, PA-C   0.5 mg at 06/11/24 1846   melatonin tablet 3 mg  3 mg Oral QHS PRN Dasie Leonor CROME, MD   3 mg at 06/11/24 2206   menthol  (CEPACOL) lozenge 3 mg  1 lozenge Oral PRN Sheldon Standing, MD       methocarbamol  (ROBAXIN ) injection 1,000 mg  1,000 mg Intravenous Q6H PRN Sheldon Standing, MD       methocarbamol  (ROBAXIN ) tablet 500 mg  500 mg Oral QID Dasie Leonor CROME, MD   500 mg at 06/11/24 2206   naphazoline-glycerin  (CLEAR EYES REDNESS) ophth solution 1-2 drop  1-2 drop Both Eyes QID PRN Sheldon Standing, MD       nystatin  (MYCOSTATIN ) 100000 UNIT/ML suspension 500,000 Units  5 mL Oral QID Krishnan, Gokul, MD   500,000 Units at 06/11/24 2206   ondansetron  (ZOFRAN ) injection 4 mg  4 mg Intravenous Q6H PRN Sheldon Standing, MD       ondansetron  (ZOFRAN -ODT) disintegrating tablet 4 mg  4 mg Oral Q6H PRN Dasie Leonor CROME, MD   4 mg at 06/09/24 0745   oxyCODONE  (Oxy IR/ROXICODONE ) immediate release tablet 10-15 mg  10-15 mg Oral Q4H PRN Johnson, Kelly R, PA-C   10 mg at 06/11/24 2206   pantoprazole  (PROTONIX ) EC tablet 40 mg  40 mg Oral Daily Dasie Leonor CROME, MD   40 mg at 06/11/24 1144   phenol (CHLORASEPTIC) mouth spray 2 spray  2 spray Mouth/Throat PRN Sheldon Standing, MD       potassium chloride  SA (KLOR-CON  M) CR tablet 20 mEq  20 mEq Oral Once Krishnan, Gokul, MD       pregabalin  (LYRICA ) capsule 25 mg  25 mg Oral BID Dasie Leonor CROME, MD   25 mg at 06/11/24 2206   prochlorperazine  (COMPAZINE ) injection 5-10 mg  5-10 mg Intravenous Q4H PRN Sheldon Standing, MD       saccharomyces boulardii (FLORASTOR) capsule 250 mg  250 mg Oral BID Krishnan, Gokul, MD   250 mg at 06/11/24 2206    sodium chloride  (OCEAN) 0.65 % nasal spray 1-2 spray  1-2 spray Each Nare Q6H PRN Sheldon Standing, MD        Allergies as of 06/05/2024 - Review Complete 06/05/2024  Allergen Reaction Noted   Bee venom Anaphylaxis, Shortness Of Breath, and Swelling 01/26/2014   Morphine  Hives, Itching, and Other (See Comments) 03/02/2024   Ferrlecit  [na ferric gluc cplx in sucrose] Swelling and Other (See Comments) 01/26/2021   Latex Hives 03/02/2024    Family History  Problem Relation Age of Onset   Throat cancer Mother        smoker   Throat cancer Father        smoker   Hypertension Father    Cirrhosis Father    Hypertension Other    Cancer Other        smoker   Diabetes Other    Asthma Other    Colon cancer Neg Hx    Breast cancer Neg Hx    Ovarian cancer Neg Hx    Pancreatic cancer Neg Hx    Prostate cancer Neg Hx     Social History   Socioeconomic History   Marital status: Single    Spouse name: Not on file   Number of children: 4   Years of education: Not on file   Highest education level: Not on file  Occupational History   Occupation: Production Designer, Theatre/television/film at [employer]  Tobacco Use   Smoking status: Every Day    Current packs/day: 0.25    Average packs/day: 0.3 packs/day for 11.0 years (2.7 ttl pk-yrs)    Types: Cigarettes    Start date: 2015   Smokeless tobacco: Never   Tobacco comments:    5-7 a day  Vaping Use   Vaping status: Never Used  Substance and Sexual Activity   Alcohol use: Yes    Alcohol/week: 0.0 standard drinks of alcohol    Comment: Ocassionally   Drug use: Not Currently    Frequency: 2.0 times per week    Types: Marijuana, MDMA (Ecstacy)   Sexual activity: Not Currently    Partners: Male    Birth control/protection: Condom, Surgical    Comment: pt. given condoms  Other Topics Concern   Not on file  Social History Narrative   Not on file   Social Drivers of Health   Tobacco Use: High Risk (06/05/2024)   Received from [facility]    Patient History    Smoking Tobacco Use: Every Day    Smokeless Tobacco Use: Never    Passive Exposure: Not on file  Financial Resource Strain: Not on file  Food Insecurity: Food Insecurity Present (06/05/2024)   Epic    Worried About Programme Researcher, Broadcasting/film/video in the Last Year: Often true    Ran Out of Food in the Last Year: Often true  Transportation Needs: No Transportation Needs (06/05/2024)   Epic    Lack of Transportation (Medical): No    Lack of Transportation (Non-Medical): No  Physical Activity: Not on file  Stress: Not on file  Social Connections: Not on file  Intimate Partner Violence: Not At Risk (06/05/2024)   Epic    Fear of Current or Ex-Partner: No    Emotionally Abused: No    Physically Abused: No    Sexually Abused: No  Depression (PHQ2-9): Medium Risk (04/06/2024)   Depression (PHQ2-9)    PHQ-2 Score: 8  Alcohol Screen: Not on file  Housing: High Risk (06/05/2024)   Epic    Unable to Pay for Housing in the Last Year: Yes  Number of Times Moved in the Last Year: 3    Homeless in the Last Year: Yes  Utilities: At Risk (06/05/2024)   Epic    Threatened with loss of utilities: Yes  Health Literacy: Not on file    Review of Systems: As per HPI  Physical Exam: Vital signs in last 24 hours: Temp:  [98 F (36.7 C)-99.8 F (37.7 C)] 98 F (36.7 C) (12/22 0631) Pulse Rate:  [71-108] 92 (12/22 0631) Resp:  [15-18] 15 (12/22 0631) BP: (99-110)/(71-86) 104/71 (12/22 0631) SpO2:  [98 %-100 %] 100 % (12/22 0631) Last BM Date : 06/11/24  General:   Alert,  Well-developed, well-nourished, pleasant and cooperative in NAD Head:  Normocephalic and atraumatic. Eyes:  Sclera clear, no icterus.  Prominent pallor Ears:  Normal auditory acuity. Nose:  No deformity, discharge,  or lesions. Mouth:  No deformity or lesions.  Oropharynx pink & moist. Neck:  Supple; no masses or thyromegaly. Lungs:  Clear throughout to auscultation.   No wheezes, crackles, or rhonchi. No  acute distress. Heart:  Regular rate and rhythm; no murmurs, clicks, rubs,  or gallops. Extremities:  Without clubbing or edema. Neurologic:  Alert and  oriented x4;  grossly normal neurologically. Skin:  Intact without significant lesions or rashes. Psych:  Alert and cooperative. Normal mood and affect. Abdomen: Dressing noted in lower abdomen, otherwise soft, mild generalized tenderness        Lab Results: Recent Labs    06/11/24 1529 06/12/24 0550  WBC 2.4* 1.8*  HGB 9.5* 8.2*  HCT 30.6* 26.6*  PLT 476* 403*   BMET Recent Labs    06/11/24 1529 06/12/24 0550  NA 130* 134*  K 3.6 3.8  CL 97* 104  CO2 20* 20*  GLUCOSE 104* 102*  BUN 11 12  CREATININE 0.89 0.84  CALCIUM  8.7* 8.4*   LFT Recent Labs    06/11/24 1529  PROT 8.9*  ALBUMIN 3.0*  AST 17  ALT 7  ALKPHOS 143*  BILITOT 0.4   PT/INR Recent Labs    06/11/24 1529  LABPROT 14.4  INR 1.1    Studies/Results: CT CHEST ABDOMEN PELVIS W CONTRAST Result Date: 06/11/2024 CLINICAL DATA:  Shortness of breath.  Abdominal pain. EXAM: CT CHEST, ABDOMEN, AND PELVIS WITH CONTRAST TECHNIQUE: Multidetector CT imaging of the chest, abdomen and pelvis was performed following the standard protocol during bolus administration of intravenous contrast. RADIATION DOSE REDUCTION: This exam was performed according to the departmental dose-optimization program which includes automated exposure control, adjustment of the mA and/or kV according to patient size and/or use of iterative reconstruction technique. CONTRAST:  OMNIPAQUE  IOHEXOL  300 MG/ML  SOLN COMPARISON:  Chest radiograph dated 03/01/2024. CT abdomen pelvis dated 02/12/2024. FINDINGS: CT CHEST FINDINGS Cardiovascular: There is no cardiomegaly or pericardial effusion. The thoracic aorta is unremarkable. The origins of the great vessels of the aortic arch and the central pulmonary arteries are patent. Mediastinum/Nodes: No hilar or mediastinal adenopathy. The esophagus  is grossly unremarkable. No mediastinal fluid collection. Lungs/Pleura: No focal consolidation, pleural effusion or pneumothorax. The central airways are patent. Musculoskeletal: No acute osseous pathology. CT ABDOMEN PELVIS FINDINGS No intra-abdominal free air or free fluid. Hepatobiliary: The liver is unremarkable. No biliary ductal dilatation. The gallbladder is unremarkable. Pancreas: Unremarkable. No pancreatic ductal dilatation or surrounding inflammatory changes. Spleen: Normal in size without focal abnormality. Adrenals/Urinary Tract: The adrenal glands unremarkable. Small nonobstructing right renal calculi measure up to 5 mm in the interpolar right kidney. No hydronephrosis. The  left kidney is unremarkable. The visualized ureters and urinary bladder appear unremarkable. Stomach/Bowel: There is no bowel obstruction or active inflammation. The appendix is normal. Vascular/Lymphatic: The abdominal aorta and IVC unremarkable. No portal venous gas. There is no adenopathy. Reproductive: Inflammatory changes and thickening of the labia and urethral orifice with diffuse stranding and edema in the subcutaneous soft tissues of the anterior pubic wall and perineum. Correlation with clinical exam recommended. No drainable fluid collection or abscess. There is apparent ulceration of the skin with small pockets of soft tissue air in the anterior pubic/labial region. Other: There is ulceration of the subcutaneous soft tissues across the anterior pelvic wall. No drainable fluid collection. Musculoskeletal: No acute osseous pathology. IMPRESSION: 1. No acute intrathoracic pathology. 2. Inflammatory changes and thickening of the labia and urethral orifice with diffuse stranding and edema in the subcutaneous soft tissues of the anterior pubic wall and perineum. 3. Ulceration of the skin of the anterior pubic and labial area. No drainable fluid collection or abscess. 4. Ulceration of the subcutaneous soft tissues across the  anterior pelvic wall. No fluid collection. 5. No bowel obstruction. Normal appendix. 6. Small nonobstructing right renal calculi. No hydronephrosis. Electronically Signed   By: Vanetta Chou M.D.   On: 06/11/2024 17:51    Impression: Dysphagia to solids and liquids, on full liquid diet, empirically started on nystatin  100,000 unit/mL suspension, 5 mL 4 times a day  Nausea, vomiting, diarrhea: CT does not show inflammation in stomach/small bowel or colon, on Imodium  4 mg 3 times a day as needed and continued on lactated Ringer 's at 100 cc an hour On pantoprazole  40 mg once a day On Zofran  4 mg IV every 6 hours as needed  Normocytic anemia, hemoglobin 8.2, MCV 81.1, reactive thrombocytosis, platelet 409 Leukopenia, WBC 1.8 Mild acidosis, bicarb 20 Mild malnutrition, albumin 3 with total protein elevated at 8.9  History of HIV/AIDS, CD4 count less than 35 on 05/11/2024, on Biktarvy  Comorbidities: Squamous cell carcinoma of abdominal wall  Plan: Will get stool studies for GI pathogen panel and C. Difficile. CT does not show evidence of gastritis/enteritis or colitis.  Plan diagnostic EGD, possible balloon dilation if needed in a.m. tomorrow. Risk and the benefits of the procedure were discussed with the patient in details. Understands and verbalizes consent..    LOS: 6 days   [name], MD  06/12/2024, 9:47 AM

## 2024-06-12 NOTE — Consult Note (Signed)
 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 So far no fever here. She reports gi sx dysphagia/diarrhea (only 1 dose vanc here periop and last abx 02/2024) so concerned for endemic fungi dissemination, viral like hsv/cmv (cmv/hsv is rather unlikely for aids in terms of gi/marrow involvement), or MAC (leukopenia is suggestive although lft rather normal for disseminated mac)  The gi sx (abd pain/diarrhea) could be cdiff as well given high risk (aids, abx past 90 days) despite leukopenia  12/21 ct abd pelv no obvious gi abnormality   -repeat viral load -appreciate GI in put -treat thrush as though she has esophageal candidiasis with fluconazole  200 mg po daily x3 weeks from 06/12/24 -continue biktarvy  -continue atovaquone    #hx 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/3 positive margin, repeated excision 05/31/24 for surgical site infection of the scc excision, s/p I&D by surgery 12/17 -presumed source control no abx post-op (mrsa, rare PsA -- r cipro , and gropu b strep) -12/20 wound pic appears healthy tissue  -observe and will see if recurrent infection will give abx     Plan: As above Reviewed gi note -- placed cdiff screen  without pcr; gi pcr Continue hiv/pjp prophy fluconazole  Reasonable to keep monitoring lower abd wound s/p I&D off abx especially no sign of active infection and concern for cdiff Maintain enteric isolation precaution Discussed with primary team      ------------------------------------------------ Principal Problem:   Wound infection after surgery    HPI: Beverly [name] is a 43 y.o. female recent abd wall scc s/p excision, aids, here with abd wound infection, course complicated by dysphagia and n/v/diarrhea  Patient admitted 12/15 for concern of abd wound infection. She had abx in 02/2024. I&D 12/15. Cx polymicrobial. Gen surg team presumed source control and had stopped periop vancomycin   Afebrile here Leukopenia/thrombocytosis Hd #3-4 complains of 6-7 watery diarrhea, n/v, and also dysphagia  On nystatin  spit/squish  Hiv restarted on biktarvy  03/2024; hx noncompliance Said she has been taking daily since  No other complaint     Family History  Problem Relation Age of Onset   Throat cancer Mother        smoker   Throat cancer Father        smoker   Hypertension Father    Cirrhosis Father    Hypertension Other    Cancer Other        smoker   Diabetes Other    Asthma Other    Colon cancer Neg Hx    Breast cancer Neg Hx    Ovarian cancer Neg Hx    Pancreatic cancer Neg Hx    Prostate cancer Neg Hx  Social History[1]  Allergies[2]  Review of Systems: ROS All Other ROS was negative, except mentioned above   Past Medical History:  Diagnosis Date   Abscess    AIDS (HCC) 03/19/2015   Anemia    Anxiety    Arthritis    Asthma    Blood transfusion without reported diagnosis    Depression    HIV infection (HCC)    Homeless 03/19/2015   states stays with family or friends. Does not rent or own a home, but does not live on streets.   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   Pneumonia    Skin ulcer (HCC) 05/06/2015       Scheduled Meds:  acetaminophen   1,000 mg Oral Q6H   atovaquone   1,500 mg Oral Daily   bictegravir-emtricitabine -tenofovir  AF  1 tablet Oral Daily   enoxaparin  (LOVENOX ) injection  40 mg Subcutaneous Q24H   fluconazole   200 mg Oral Daily   gabapentin   100 mg Oral TID   lamoTRIgine   25 mg Oral q morning   methocarbamol   500 mg Oral QID   pantoprazole   40 mg Oral Daily   pregabalin   25 mg Oral BID   Continuous Infusions:  lactated ringers  75 mL/hr at 06/12/24 1503   PRN Meds:.acetaminophen , acetaminophen , albuterol , alum & mag hydroxide-simeth, diphenhydrAMINE  **OR** diphenhydrAMINE , HYDROmorphone  (DILAUDID ) injection, loperamide , LORazepam , melatonin, menthol , methocarbamol  (ROBAXIN ) injection, naphazoline-glycerin , ondansetron  (ZOFRAN ) IV, ondansetron  **OR** [DISCONTINUED] ondansetron  (ZOFRAN ) IV, oxyCODONE , phenol, prochlorperazine , sodium chloride    OBJECTIVE: Blood pressure 97/69, pulse 95, temperature 98.6 F (37 C), temperature source Oral, resp. rate 17, height 5' 1 (1.549 m), weight 53 kg, SpO2 95%.  Physical Exam General/constitutional: no distress, pleasant HEENT: Normocephalic, PER, Conj Clear, EOMI, Oropharynx clear Neck supple CV: rrr no mrg Lungs: clear to auscultation, normal respiratory effort Abd: Soft, Nontender; wound dressing c/d/I Reviewed picture from 12/20 of abd wound Ext: no edema Skin: No Rash Neuro: nonfocal MSK: no peripheral joint swelling/tenderness/warmth; back spines nontender    Lab Results Lab Results  Component Value Date   WBC 1.8 (L) 06/12/2024   HGB 8.2 (L) 06/12/2024   HCT 26.6 (L) 06/12/2024   MCV 81.1 06/12/2024   PLT 403 (H) 06/12/2024    Lab Results  Component Value Date   CREATININE 0.84 06/12/2024   BUN 12 06/12/2024   NA 134 (L) 06/12/2024   K 3.8 06/12/2024   CL 104 06/12/2024   CO2 20 (L) 06/12/2024    Lab Results   Component Value Date   ALT 7 06/11/2024   AST 17 06/11/2024   ALKPHOS 143 (H) 06/11/2024   BILITOT 0.4 06/11/2024      Microbiology: Recent Results (from the past 240 hours)  Fungus Culture With Stain     Status: None (Preliminary result)   Collection Time: 06/05/24  7:10 PM   Specimen: Abdomen; Abscess  Result Value Ref Range Status   Fungus Stain Final report  Final    Comment: (NOTE) Performed At: [facility] [address] [place], [place] [id number] Jennette Shorter MD Ey:[id number]    Fungus (Mycology) Culture PENDING  Incomplete   Fungal Source WOUND  Final    Comment: ABDOMINAL WALL Performed at [facility], [address] [place], [place] [address]   Aerobic/Anaerobic Culture w Gram Stain (surgical/deep wound)     Status: None   Collection Time: 06/05/24  7:10 PM   Specimen: Abdomen; Abscess  Result Value Ref Range Status   Specimen Description   Final    WOUND ABDOMINAL WALL Performed at [facility], [address] [place], [place] [address]    Special Requests   Final    NONE Performed at [facility], [address] [place], [place] [address]    Gram Stain   Final    FEW WBC PRESENT, PREDOMINANTLY PMN FEW GRAM POSITIVE COCCI FEW GRAM NEGATIVE RODS    Culture   Final    RARE METHICILLIN RESISTANT STAPHYLOCOCCUS AUREUS RARE PSEUDOMONAS AERUGINOSA FEW GROUP B STREP(S.AGALACTIAE)ISOLATED TESTING AGAINST S. AGALACTIAE NOT ROUTINELY PERFORMED DUE TO PREDICTABILITY OF AMP/PEN/VAN SUSCEPTIBILITY. MODERATE BACTEROIDES FRAGILIS BETA LACTAMASE POSITIVE Performed at [facility], [address] [place], [place] [address]    Report Status 06/09/2024 FINAL  Final   Organism ID, Bacteria METHICILLIN RESISTANT STAPHYLOCOCCUS AUREUS  Final   Organism ID, Bacteria PSEUDOMONAS AERUGINOSA  Final      Susceptibility   Methicillin resistant staphylococcus aureus - MIC*    CIPROFLOXACIN  <=0.5  SENSITIVE Sensitive     ERYTHROMYCIN >=8 RESISTANT Resistant     GENTAMICIN <=0.5 SENSITIVE Sensitive     OXACILLIN >=4 RESISTANT Resistant     TETRACYCLINE >=16 RESISTANT Resistant     VANCOMYCIN  <=0.5 SENSITIVE Sensitive     TRIMETH /SULFA  <=10 SENSITIVE Sensitive     CLINDAMYCIN  <=0.25 SENSITIVE Sensitive     RIFAMPIN <=0.5 SENSITIVE Sensitive     Inducible Clindamycin  NEGATIVE Sensitive     LINEZOLID 2 SENSITIVE Sensitive     * RARE METHICILLIN RESISTANT STAPHYLOCOCCUS AUREUS   Pseudomonas aeruginosa - MIC*    MEROPENEM 4 INTERMEDIATE Intermediate     CIPROFLOXACIN  >=4 RESISTANT Resistant     IMIPENEM 2 SENSITIVE Sensitive     CEFTAZIDIME/AVIBACTAM 4 SENSITIVE Sensitive     CEFTOLOZANE/TAZOBACTAM 1 SENSITIVE Sensitive     TOBRAMYCIN <=1 SENSITIVE Sensitive     CEFTAZIDIME 4 SENSITIVE Sensitive     * RARE PSEUDOMONAS AERUGINOSA  Fungus Culture Result     Status: None   Collection Time: 06/05/24  7:10 PM  Result Value Ref Range Status   Result 1 Comment  Final    Comment: (NOTE) KOH/Calcofluor preparation:  no fungus observed. Performed At: [facility] [address] [place], [place] [id number] Jennette Shorter MD Ey:[id number]      Serology:    Imaging: If present, new imagings (plain films, ct scans, and mri) have been personally visualized and interpreted; radiology reports have been reviewed. Decision making incorporated into the Impression / Recommendations.  12/21 ct abd pelv chest with contrast 1. No acute intrathoracic pathology. 2. Inflammatory changes and thickening of the labia and urethral orifice with diffuse stranding and edema in the subcutaneous soft tissues of the anterior pubic wall and perineum. 3. Ulceration of the skin of the anterior pubic and labial area. No drainable fluid collection or abscess. 4. Ulceration of the subcutaneous soft tissues across the anterior pelvic wall. No fluid collection. 5. No bowel obstruction. Normal appendix. 6.  Small nonobstructing right renal calculi. No hydronephrosis.     Constance ONEIDA Passer, MD [facility] [phone] pager    06/12/2024, 4:18 PM     [1]  Social History Tobacco Use   Smoking status: Every Day    Current packs/day: 0.25    Average packs/day: 0.3 packs/day for 11.0 years (2.7 ttl pk-yrs)  Types: Cigarettes    Start date: 2015   Smokeless tobacco: Never   Tobacco comments:    5-7 a day  Vaping Use   Vaping status: Never Used  Substance Use Topics   Alcohol use: Yes    Alcohol/week: 0.0 standard drinks of alcohol    Comment: Ocassionally   Drug use: Not Currently    Frequency: 2.0 times per week    Types: Marijuana, MDMA (Ecstacy)  [2]  Allergies Allergen Reactions   Bee Venom Anaphylaxis, Shortness Of Breath and Swelling   Morphine  Hives, Itching and Other (See Comments)    And- Tolerates hydrocodone , oxycodone , and codeine with no issues   Ferrlecit  [Na Ferric Gluc Cplx In Sucrose] Swelling and Other (See Comments)    Swelling and puffiness in hands and legs after Ferrlecit  250 mg infused over 60 minutes (possibly infusion related reaction)   Latex Hives   "

## 2024-06-12 NOTE — Plan of Care (Signed)
   Problem: Education: Goal: Knowledge of General Education information will improve Description: Including pain rating scale, medication(s)/side effects and non-pharmacologic comfort measures Outcome: Progressing   Problem: Pain Managment: Goal: General experience of comfort will improve and/or be controlled Outcome: Progressing   Problem: Safety: Goal: Ability to remain free from injury will improve Outcome: Progressing

## 2024-06-12 NOTE — Progress Notes (Signed)
 "  [facility] PROGRESS NOTE   Beverly [name] FMW:[id number] DOB: 01/29/1981 DOA: 06/05/2024  PCP: Melvenia Corean SAILOR, NP  Brief History: 43 y.o. 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  She was discharged home after that surgical procedure.  She presented to the general surgeons office on 12/15 with complaints of increased pain in her wound and persistent drainage from the wound.  She was directly admitted to the hospital.  There was concern for wound infection.  She underwent incision and drainage of the abdominal wall wound on 12/15.  Based on microbiology it looks like she grew out MRSA and Pseudomonas from the wound cultures.  Because of adequate drainage and clean wound postoperatively she was not continued on antibiotics.  She was given vancomycin  for surgical prophylaxis.  Plan was for wound care education and then subsequently discharge home.  Hospitalist service called for consultation since patient started complaining of nausea vomiting dizziness and loose stools.  Patient also mention symptoms suggestive of dysphagia.    Consultants: Gastroenterology.  Infectious disease  Procedures: None yet    Subjective/Interval History: Patient mentioned that she is feeling slightly better today compared to yesterday.  Not as dizzy.  Stool is more formed according to patient and nursing staff.  Continues to have abdominal pain.  Continues to have difficulty swallowing to solids.    Assessment/Plan:  Nausea vomiting diarrhea/abdominal pain Abdominal pain appears to be mostly secondary to her abdominal wound and recent surgery.  CT scan did not raise concern for inflammatory changes in the perineum, anterior pubic wall, labia and urethral orifice.  However no fluid collection was noted.  Ulceration of the skin of the anterior pubic and labial area was also noted.  According to general surgery these are  expected findings considering her recent surgical interventions. Since her stool is more formed now we will hold off on stool studies.  Continue pain control.  Continue PPI.   No abnormality noted in the lungs.  She denies any shortness of breath this morning.  Saturations are normal on room air.  Dysphagia Etiology unclear.  She does have history of HIV.  Differential diagnosis is broad.  She has never had endoscopy previously.  There is some whitish plaques in her mouth raising concern for candidiasis.  Continue nystatin  for now.  Gastroenterology has been consulted to consider upper endoscopy.     Abdominal wound/squamous cell carcinoma abdominal wall She is status post excision of abdominal wound which was positive for squamous cell carcinoma.  Subsequently developed wound infection and underwent I&D.  Wound cultures grew MRSA and Pseudomonas however since most of the infection was surgically removed it was felt by surgery team that antibiotics were not necessary.  She has not been on antibiotics.   CT scan shows inflammatory changes without any fluid collection.  She is afebrile.  She is leukopenic.  Abnormal UA Abnormal UA noted.  She does complain of mild dysuria.  Will proceed with urine culture.  Due to her recent complicated history with underlying HIV/AIDS we will request infectious disease to assist with management.  Hold off on antibacterials for now.   HIV/AIDS She is on Biktarvy  and atovaquone .  She said that she has been compliant with her medications.  CD4 counts are pending.  ID to see.     Dizziness/borderline low blood pressure Likely due to hypovolemia.  Improved with IV fluids.  Continue to monitor.  Leukopenia/normocytic  anemia Most likely due to HIV/chronic disease.  No evidence of overt bleeding.  Recently checked iron panel in September reviewed.  Ferritin was 92, iron 63, TIBC 259, percent saturation 24.  Hypomagnesemia/hyponatremia Magnesium  has been supplemented.   Sodium level has improved with IV hydration.     DVT Prophylaxis: Lovenox  Code Status: Full code Family Communication: Discussed with patient Disposition Plan: To be determined     Medications: Scheduled:  acetaminophen   1,000 mg Oral Q6H   atovaquone   1,500 mg Oral Daily   bictegravir-emtricitabine -tenofovir  AF  1 tablet Oral Daily   enoxaparin  (LOVENOX ) injection  40 mg Subcutaneous Q24H   gabapentin   100 mg Oral TID   lamoTRIgine   25 mg Oral q morning   methocarbamol   500 mg Oral QID   nystatin   5 mL Oral QID   pantoprazole   40 mg Oral Daily   potassium chloride   20 mEq Oral Once   pregabalin   25 mg Oral BID   Continuous:  lactated ringers  100 mL/hr at 06/12/24 9376   PRN:acetaminophen , acetaminophen , albuterol , alum & mag hydroxide-simeth, diphenhydrAMINE  **OR** diphenhydrAMINE , HYDROmorphone  (DILAUDID ) injection, loperamide , LORazepam , melatonin, menthol , methocarbamol  (ROBAXIN ) injection, naphazoline-glycerin , ondansetron  (ZOFRAN ) IV, ondansetron  **OR** [DISCONTINUED] ondansetron  (ZOFRAN ) IV, oxyCODONE , phenol, prochlorperazine , sodium chloride   Antibiotics: Anti-infectives (From admission, onward)    Start     Dose/Rate Route Frequency Ordered Stop   06/06/24 1000  bictegravir-emtricitabine -tenofovir  AF (BIKTARVY ) 50-200-25 MG per tablet 1 tablet        1 tablet Oral Daily 06/05/24 1927     06/06/24 1000  atovaquone  (MEPRON ) 750 MG/5ML suspension 1,500 mg        1,500 mg Oral Daily 06/05/24 1927     06/05/24 1645  vancomycin  (VANCOCIN ) IVPB 1000 mg/200 mL premix        1,000 mg 200 mL/hr over 60 Minutes Intravenous On call to O.R. 06/05/24 1628 06/05/24 2040   06/05/24 1631  vancomycin  (VANCOCIN ) 1-5 GM/200ML-% IVPB       Note to Pharmacy: [name] W: cabinet override      06/05/24 1631 06/06/24 0444       Objective:  Vital Signs  Vitals:   06/11/24 0544 06/11/24 1205 06/11/24 2155 06/12/24 0631  BP: 115/81 99/74 110/86 104/71  Pulse: 100 71 (!) 108  92  Resp: 16 18 16 15   Temp: 99.2 F (37.3 C) 99.8 F (37.7 C) 98.1 F (36.7 C) 98 F (36.7 C)  TempSrc: Oral Oral Oral Oral  SpO2: 100% 98% 100% 100%  Weight:      Height:        Intake/Output Summary (Last 24 hours) at 06/12/2024 0952 Last data filed at 06/12/2024 0847 Gross per 24 hour  Intake 2455.44 ml  Output 950 ml  Net 1505.44 ml   Filed Weights   06/05/24 1625  Weight: 53 kg    General appearance: Awake alert.  In no distress Resp: Clear to auscultation bilaterally.  Normal effort Cardio: S1-S2 is normal regular.  No S3-S4.  No rubs murmurs or bruit GI: Abdomen is soft.  Dressing noted.  Tender to palpation. Extremities: No edema.  Full range of motion of lower extremities. Neurologic: Alert and oriented x3.  No focal neurological deficits.  Physical deconditioning noted.   Lab Results:  Data Reviewed: I have personally reviewed following labs and reports of the imaging studies  CBC: Recent Labs  Lab 06/06/24 0316 06/09/24 0327 06/11/24 1529 06/12/24 0550  WBC 3.4* 3.2* 2.4* 1.8*  NEUTROABS  --   --  1.4*  --   HGB 9.0* 9.4* 9.5* 8.2*  HCT 29.5* 30.9* 30.6* 26.6*  MCV 82.4 82.0 80.3 81.1  PLT 419* 519* 476* 403*    Basic Metabolic Panel: Recent Labs  Lab 06/06/24 0316 06/09/24 0327 06/11/24 1529 06/12/24 0550  NA 134* 133* 130* 134*  K 4.4 3.4* 3.6 3.8  CL 100 100 97* 104  CO2 24 23 20* 20*  GLUCOSE 131* 102* 104* 102*  BUN 13 17 11 12   CREATININE 0.85 1.06* 0.89 0.84  CALCIUM  8.8* 8.6* 8.7* 8.4*  MG  --   --  1.4* 2.4  PHOS  --   --  3.2  --     GFR: Estimated Creatinine Clearance: 66.5 mL/min (by C-G formula based on SCr of 0.84 mg/dL).  Liver Function Tests: Recent Labs  Lab 06/11/24 1529  AST 17  ALT 7  ALKPHOS 143*  BILITOT 0.4  PROT 8.9*  ALBUMIN 3.0*    Recent Labs  Lab 06/11/24 1529  LIPASE 22   Coagulation Profile: Recent Labs  Lab 06/11/24 1529  INR 1.1    Recent Results (from the past 240 hours)   Fungus Culture With Stain     Status: None (Preliminary result)   Collection Time: 06/05/24  7:10 PM   Specimen: Abdomen; Abscess  Result Value Ref Range Status   Fungus Stain Final report  Final    Comment: (NOTE) Performed At: [facility] [address] [place], [place] [id number] Jennette Shorter MD Ey:[id number]    Fungus (Mycology) Culture PENDING  Incomplete   Fungal Source WOUND  Final    Comment: ABDOMINAL WALL Performed at [facility], [address] [place], [place] [address]   Aerobic/Anaerobic Culture w Gram Stain (surgical/deep wound)     Status: None   Collection Time: 06/05/24  7:10 PM   Specimen: Abdomen; Abscess  Result Value Ref Range Status   Specimen Description   Final    WOUND ABDOMINAL WALL Performed at [facility], [address] [place], [place] [address]    Special Requests   Final    NONE Performed at [facility], [address] [place], [place] [address]    Gram Stain   Final    FEW WBC PRESENT, PREDOMINANTLY PMN FEW GRAM POSITIVE COCCI FEW GRAM NEGATIVE RODS    Culture   Final    RARE METHICILLIN RESISTANT STAPHYLOCOCCUS AUREUS RARE PSEUDOMONAS AERUGINOSA FEW GROUP B STREP(S.AGALACTIAE)ISOLATED TESTING AGAINST S. AGALACTIAE NOT ROUTINELY PERFORMED DUE TO PREDICTABILITY OF AMP/PEN/VAN SUSCEPTIBILITY. MODERATE BACTEROIDES FRAGILIS BETA LACTAMASE POSITIVE Performed at [facility], [address], [place], [place] [address]    Report Status 06/09/2024 FINAL  Final   Organism ID, Bacteria METHICILLIN RESISTANT STAPHYLOCOCCUS AUREUS  Final   Organism ID, Bacteria PSEUDOMONAS AERUGINOSA  Final      Susceptibility   Methicillin resistant staphylococcus aureus - MIC*    CIPROFLOXACIN  <=0.5 SENSITIVE Sensitive     ERYTHROMYCIN >=8 RESISTANT Resistant     GENTAMICIN <=0.5 SENSITIVE Sensitive     OXACILLIN >=4 RESISTANT Resistant     TETRACYCLINE >=16 RESISTANT Resistant      VANCOMYCIN  <=0.5 SENSITIVE Sensitive     TRIMETH /SULFA  <=10 SENSITIVE Sensitive     CLINDAMYCIN  <=0.25 SENSITIVE Sensitive     RIFAMPIN <=0.5 SENSITIVE Sensitive     Inducible Clindamycin  NEGATIVE Sensitive     LINEZOLID 2 SENSITIVE Sensitive     * RARE METHICILLIN RESISTANT STAPHYLOCOCCUS AUREUS   Pseudomonas  aeruginosa - MIC*    MEROPENEM 4 INTERMEDIATE Intermediate     CIPROFLOXACIN  >=4 RESISTANT Resistant     IMIPENEM 2 SENSITIVE Sensitive     CEFTAZIDIME/AVIBACTAM 4 SENSITIVE Sensitive     CEFTOLOZANE/TAZOBACTAM 1 SENSITIVE Sensitive     TOBRAMYCIN <=1 SENSITIVE Sensitive     CEFTAZIDIME 4 SENSITIVE Sensitive     * RARE PSEUDOMONAS AERUGINOSA  Fungus Culture Result     Status: None   Collection Time: 06/05/24  7:10 PM  Result Value Ref Range Status   Result 1 Comment  Final    Comment: (NOTE) KOH/Calcofluor preparation:  no fungus observed. Performed At: [facility] [address] [place], [place] [id number] Jennette Shorter MD Ey:[id number]       Radiology Studies: CT CHEST ABDOMEN PELVIS W CONTRAST Result Date: 06/11/2024 CLINICAL DATA:  Shortness of breath.  Abdominal pain. EXAM: CT CHEST, ABDOMEN, AND PELVIS WITH CONTRAST TECHNIQUE: Multidetector CT imaging of the chest, abdomen and pelvis was performed following the standard protocol during bolus administration of intravenous contrast. RADIATION DOSE REDUCTION: This exam was performed according to the departmental dose-optimization program which includes automated exposure control, adjustment of the mA and/or kV according to patient size and/or use of iterative reconstruction technique. CONTRAST:  OMNIPAQUE  IOHEXOL  300 MG/ML  SOLN COMPARISON:  Chest radiograph dated 03/01/2024. CT abdomen pelvis dated 02/12/2024. FINDINGS: CT CHEST FINDINGS Cardiovascular: There is no cardiomegaly or pericardial effusion. The thoracic aorta is unremarkable. The origins of the great vessels of the aortic arch and the central  pulmonary arteries are patent. Mediastinum/Nodes: No hilar or mediastinal adenopathy. The esophagus is grossly unremarkable. No mediastinal fluid collection. Lungs/Pleura: No focal consolidation, pleural effusion or pneumothorax. The central airways are patent. Musculoskeletal: No acute osseous pathology. CT ABDOMEN PELVIS FINDINGS No intra-abdominal free air or free fluid. Hepatobiliary: The liver is unremarkable. No biliary ductal dilatation. The gallbladder is unremarkable. Pancreas: Unremarkable. No pancreatic ductal dilatation or surrounding inflammatory changes. Spleen: Normal in size without focal abnormality. Adrenals/Urinary Tract: The adrenal glands unremarkable. Small nonobstructing right renal calculi measure up to 5 mm in the interpolar right kidney. No hydronephrosis. The left kidney is unremarkable. The visualized ureters and urinary bladder appear unremarkable. Stomach/Bowel: There is no bowel obstruction or active inflammation. The appendix is normal. Vascular/Lymphatic: The abdominal aorta and IVC unremarkable. No portal venous gas. There is no adenopathy. Reproductive: Inflammatory changes and thickening of the labia and urethral orifice with diffuse stranding and edema in the subcutaneous soft tissues of the anterior pubic wall and perineum. Correlation with clinical exam recommended. No drainable fluid collection or abscess. There is apparent ulceration of the skin with small pockets of soft tissue air in the anterior pubic/labial region. Other: There is ulceration of the subcutaneous soft tissues across the anterior pelvic wall. No drainable fluid collection. Musculoskeletal: No acute osseous pathology. IMPRESSION: 1. No acute intrathoracic pathology. 2. Inflammatory changes and thickening of the labia and urethral orifice with diffuse stranding and edema in the subcutaneous soft tissues of the anterior pubic wall and perineum. 3. Ulceration of the skin of the anterior pubic and labial area. No  drainable fluid collection or abscess. 4. Ulceration of the subcutaneous soft tissues across the anterior pelvic wall. No fluid collection. 5. No bowel obstruction. Normal appendix. 6. Small nonobstructing right renal calculi. No hydronephrosis. Electronically Signed   By: Vanetta Chou M.D.   On: 06/11/2024 17:51      LOS: 6 days   Laquisha Northcraft  [facility] Pager on  [email or web address]  06/12/2024, 9:52 AM   "

## 2024-06-12 NOTE — Progress Notes (Signed)
 7 Days Post-Op   Subjective/Chief Complaint: Patient reports emesis yesterday. Has had some BM's   Objective: Vital signs in last 24 hours: Temp:  [98 F (36.7 C)-99.8 F (37.7 C)] 98 F (36.7 C) (12/22 0631) Pulse Rate:  [71-108] 92 (12/22 0631) Resp:  [15-18] 15 (12/22 0631) BP: (99-110)/(71-86) 104/71 (12/22 0631) SpO2:  [98 %-100 %] 100 % (12/22 0631) Last BM Date : 06/11/24  Intake/Output from previous day: 12/21 0701 - 12/22 0700 In: 2235.4 [P.O.:240; I.V.:971; IV Piggyback:1024.4] Out: 550 [Urine:450; Emesis/NG output:100] Intake/Output this shift: No intake/output data recorded.  Exam: Awake and alert Comfortable Abdomen soft, wound stable with some drainage I did not examine her pelvic wounds  Lab Results:  Recent Labs    06/11/24 1529 06/12/24 0550  WBC 2.4* 1.8*  HGB 9.5* 8.2*  HCT 30.6* 26.6*  PLT 476* 403*   BMET Recent Labs    06/11/24 1529 06/12/24 0550  NA 130* 134*  K 3.6 3.8  CL 97* 104  CO2 20* 20*  GLUCOSE 104* 102*  BUN 11 12  CREATININE 0.89 0.84  CALCIUM  8.7* 8.4*   PT/INR Recent Labs    06/11/24 1529  LABPROT 14.4  INR 1.1   ABG No results for input(s): PHART, HCO3 in the last 72 hours.  Invalid input(s): PCO2, PO2  Studies/Results: CT CHEST ABDOMEN PELVIS W CONTRAST Result Date: 06/11/2024 CLINICAL DATA:  Shortness of breath.  Abdominal pain. EXAM: CT CHEST, ABDOMEN, AND PELVIS WITH CONTRAST TECHNIQUE: Multidetector CT imaging of the chest, abdomen and pelvis was performed following the standard protocol during bolus administration of intravenous contrast. RADIATION DOSE REDUCTION: This exam was performed according to the departmental dose-optimization program which includes automated exposure control, adjustment of the mA and/or kV according to patient size and/or use of iterative reconstruction technique. CONTRAST:  OMNIPAQUE  IOHEXOL  300 MG/ML  SOLN COMPARISON:  Chest radiograph dated 03/01/2024. CT  abdomen pelvis dated 02/12/2024. FINDINGS: CT CHEST FINDINGS Cardiovascular: There is no cardiomegaly or pericardial effusion. The thoracic aorta is unremarkable. The origins of the great vessels of the aortic arch and the central pulmonary arteries are patent. Mediastinum/Nodes: No hilar or mediastinal adenopathy. The esophagus is grossly unremarkable. No mediastinal fluid collection. Lungs/Pleura: No focal consolidation, pleural effusion or pneumothorax. The central airways are patent. Musculoskeletal: No acute osseous pathology. CT ABDOMEN PELVIS FINDINGS No intra-abdominal free air or free fluid. Hepatobiliary: The liver is unremarkable. No biliary ductal dilatation. The gallbladder is unremarkable. Pancreas: Unremarkable. No pancreatic ductal dilatation or surrounding inflammatory changes. Spleen: Normal in size without focal abnormality. Adrenals/Urinary Tract: The adrenal glands unremarkable. Small nonobstructing right renal calculi measure up to 5 mm in the interpolar right kidney. No hydronephrosis. The left kidney is unremarkable. The visualized ureters and urinary bladder appear unremarkable. Stomach/Bowel: There is no bowel obstruction or active inflammation. The appendix is normal. Vascular/Lymphatic: The abdominal aorta and IVC unremarkable. No portal venous gas. There is no adenopathy. Reproductive: Inflammatory changes and thickening of the labia and urethral orifice with diffuse stranding and edema in the subcutaneous soft tissues of the anterior pubic wall and perineum. Correlation with clinical exam recommended. No drainable fluid collection or abscess. There is apparent ulceration of the skin with small pockets of soft tissue air in the anterior pubic/labial region. Other: There is ulceration of the subcutaneous soft tissues across the anterior pelvic wall. No drainable fluid collection. Musculoskeletal: No acute osseous pathology. IMPRESSION: 1. No acute intrathoracic pathology. 2. Inflammatory  changes and thickening of the labia  and urethral orifice with diffuse stranding and edema in the subcutaneous soft tissues of the anterior pubic wall and perineum. 3. Ulceration of the skin of the anterior pubic and labial area. No drainable fluid collection or abscess. 4. Ulceration of the subcutaneous soft tissues across the anterior pelvic wall. No fluid collection. 5. No bowel obstruction. Normal appendix. 6. Small nonobstructing right renal calculi. No hydronephrosis. Electronically Signed   By: Vanetta Chou M.D.   On: 06/11/2024 17:51    Anti-infectives: Anti-infectives (From admission, onward)    Start     Dose/Rate Route Frequency Ordered Stop   06/06/24 1000  bictegravir-emtricitabine -tenofovir  AF (BIKTARVY ) 50-200-25 MG per tablet 1 tablet        1 tablet Oral Daily 06/05/24 1927     06/06/24 1000  atovaquone  (MEPRON ) 750 MG/5ML suspension 1,500 mg        1,500 mg Oral Daily 06/05/24 1927     06/05/24 1645  vancomycin  (VANCOCIN ) IVPB 1000 mg/200 mL premix        1,000 mg 200 mL/hr over 60 Minutes Intravenous On call to O.R. 06/05/24 1628 06/05/24 2040   06/05/24 1631  vancomycin  (VANCOCIN ) 1-5 GM/200ML-% IVPB       Note to Pharmacy: [name] W: cabinet override      06/05/24 1631 06/06/24 0444       Assessment/Plan: POD 7, s/p drainage of wound infection by Dr. 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 Rogelio (GYN)  -pt remains immune suppressed. Hospitalists consult yesterday to assist with care.  From a general surgical standpoint, antibiotics were stopped post op given source control of abdominal wound.  CTs yesterday ok from a intra-abdominal standpoint.  Agree the pt may need an ID evaluation  -continue wound care Vicenta Poli 06/12/2024

## 2024-06-12 NOTE — Consult Note (Signed)
 [facility] Consult  Referring Provider: Dr. Joette Hedger hospitalist Primary Care Physician:  Melvenia Corean SAILOR, NP Primary Gastroenterologist: [place]  Reason for Consultation: Dysphagia  HPI: Beverly [name] is a 43 y.o. female with HIV, squamous cell cancer of lower abdomen, excision of lower abdominal wall skin cancer on 05/24/2024, underwent incision and drainage of abdominal wall wound on 06/05/2024 with culture showing MRSA and Pseudomonas aeruginosa.  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  Patient states that for the last 4 days she has developed severe diarrhea, described as 6-7 loose bowel movements associated with urgency and fecal accidents along with nausea and vomiting for the past 3 days.  She has also been experiencing difficulty swallowing solids as well as liquids which are new for her.  She has acid reflux and heartburn.  She also complains of lower abdominal pain.  She reports about 5 pound weight loss in the last several weeks.  No prior EGD or colonoscopy.  Past Medical History:  Diagnosis Date   Abscess    AIDS (HCC) 03/19/2015   Anemia    Anxiety    Arthritis    Asthma    Blood transfusion without reported diagnosis    Depression    HIV infection (HCC)    Homeless 03/19/2015   states stays with family or friends. Does not rent or own a home, but does not live on streets.   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   Pneumonia    Skin ulcer (HCC) 05/06/2015    Past Surgical History:  Procedure Laterality Date   BIOPSY OF SKIN SUBCUTANEOUS TISSUE AND/OR  MUCOUS MEMBRANE N/A 02/14/2024   Procedure: Incisional Biopsy of Abdominal Wall Wound;  Surgeon: Curvin Deward MOULD, MD;  Location: [facility];  Service: General;  Laterality: N/A;   CERVICAL CONIZATION W/BX N/A 04/14/2018   Procedure: COLD KNIFE CONIZATION CERVIX WITH BIOPSY;  Surgeon: Anitra Freddy NOVAK, MD;  Location: [facility];  Service: Gynecology;  Laterality: N/A;   CESAREAN SECTION     DILATION AND CURETTAGE OF UTERUS N/A 04/14/2018   Procedure: ENDOCERVICAL CURETTAGE;  Surgeon: Anitra Freddy NOVAK, MD;  Location: [facility];  Service: Gynecology;  Laterality: N/A;   EXAM UNDER ANESTHESIA, PELVIC N/A 05/31/2024   Procedure: EXAM UNDER ANESTHESIA, PELVIC;  Surgeon: Rogelio Planas, MD;  Location: [facility];  Service: Gynecology;  Laterality: N/A;   EXCISION MASS ABDOMINAL N/A 05/24/2024   Procedure: EXCISION, MASS, TORSO;  Surgeon: Curvin Deward MOULD, MD;  Location: [facility];  Service: General;  Laterality: N/A;  EXCISION SQUAMOUS ELL CANCER LOWER ABDOMINAL WALL   INCISION AND DRAINAGE ABSCESS Right 01/10/2016   Procedure: INCISION AND DRAINAGE RIGHT BUTTOCK, LEFT LABIAL , EXCISION AND DRAINAGE OF  RIGHT AXILLARY ABSCESS;  Surgeon: Morene Olives, MD;  Location: [facility];  Service: General;  Laterality: Right;   INCISION AND DRAINAGE ABSCESS Bilateral 06/05/2024   Procedure: INCISION AND DRAINAGE OF ABDOMINAL WALL ASBCESS;  Surgeon: Dasie Leonor LITTIE, MD;  Location: [facility];  Service: General;  Laterality: Bilateral;   LEEP  05/31/2024   Procedure: LEEP (LOOP ELECTROSURGICAL EXCISION PROCEDURE);  Surgeon: Rogelio Planas, MD;  Location: [facility];  Service: Gynecology;;   TUBAL LIGATION  2005   VULVA MARYBETH BIOPSY N/A 04/14/2018   Procedure: VULVAR BIOPSIES;  Surgeon: Anitra Freddy NOVAK, MD;  Location: [facility];  Service: Gynecology;  Laterality: N/A;    Prior to Admission medications  Medication Sig Start Date End Date Taking? Authorizing Provider   acetaminophen  (TYLENOL ) 325 MG tablet Take 2 tablets (650 mg total) by mouth every 6 (six) hours as needed for mild pain, fever or headache. Patient taking differently: Take 325-650 mg by mouth daily as needed for mild pain (pain score 1-3), headache or moderate pain (pain score 4-6). 12/24/22  Yes Danton Reyes DASEN, MD  atovaquone  (MEPRON ) 750 MG/5ML suspension Take 10 mLs (1,500 mg total) by mouth daily. 03/16/24  Yes Melvenia Corean SAILOR, NP  bictegravir-emtricitabine -tenofovir  AF (BIKTARVY ) 50-200-25 MG TABS tablet Take 1 tablet by mouth daily. 02/22/24  Yes Dennise Kingsley, MD  diphenhydrAMINE  HCl (BENADRYL  ALLERGY PO) Take 25 mg by mouth daily as needed (allergies).   Yes [provider]  gabapentin  (NEURONTIN ) 100 MG capsule Take 100 mg by mouth 3 (three) times daily. 05/29/24 05/29/25 Yes [provider]  lamoTRIgine  (LAMICTAL ) 25 MG tablet Take 25 mg by mouth every morning. 05/23/24  Yes [provider]  ondansetron  (ZOFRAN ) 4 MG tablet Take 1 tablet (4 mg total) by mouth every 6 (six) hours as needed for nausea. Patient taking differently: Take 4 mg by mouth daily as needed for nausea or vomiting. 02/16/24  Yes Pokhrel, Laxman, MD  oxyCODONE  (ROXICODONE ) 5 MG immediate release tablet Take 1 tablet (5 mg total) by mouth every 8 (eight) hours as needed. Patient taking differently: Take 5 mg by mouth every 6 (six) hours as needed for moderate pain (pain score 4-6) or severe pain (pain score 7-10). 05/24/24 05/24/25 Yes Curvin Deward MOULD, MD  pregabalin  (LYRICA ) 25 MG capsule Take 1 capsule (25 mg total) by mouth 2 (two) times daily. 03/16/24  Yes Melvenia Corean SAILOR, NP  triamcinolone  ointment (KENALOG ) 0.1 % Apply 1 Application topically 2 (two) times daily. Patient taking differently: Apply 1 Application topically daily. 05/22/24  Yes Eldonna Mays, MD  albuterol  (VENTOLIN  HFA) [phone] Base) MCG/ACT inhaler Inhale 2 puffs into the lungs every 6 (six) hours as needed for wheezing  or shortness of breath. Patient not taking: Reported on 06/07/2024    [provider]  ATROVENT  HFA 17 MCG/ACT inhaler Inhale 2 puffs into the lungs every 6 (six) hours as needed for wheezing. Patient not taking: Reported on 06/07/2024    [provider]  pantoprazole  (PROTONIX ) 40 MG tablet Take 1 tablet (40 mg total) by mouth daily. Patient not taking: Reported on 06/07/2024 02/17/24 02/16/25  Sonjia Held, MD    Current Facility-Administered Medications  Medication Dose Route Frequency Provider Last Rate Last Admin   acetaminophen  (TYLENOL ) suppository 650 mg  650 mg Rectal Q6H PRN Sheldon Standing, MD       acetaminophen  (TYLENOL ) tablet 1,000 mg  1,000 mg Oral Q6H Dasie Best L, MD   1,000 mg at 06/11/24 1801   acetaminophen  (TYLENOL ) tablet 325-650 mg  325-650 mg Oral Q6H PRN Sheldon Standing, MD       albuterol  (PROVENTIL ) (2.5 MG/3ML) 0.083% nebulizer solution 3 mL  3 mL Inhalation Q6H PRN Dasie Best CROME, MD       alum & mag hydroxide-simeth (MAALOX/MYLANTA) 200-200-20 MG/5ML suspension 30 mL  30 mL Oral Q6H PRN Sheldon Standing, MD       atovaquone  (MEPRON ) 750  MG/5ML suspension 1,500 mg  1,500 mg Oral Daily Dasie Leonor CROME, MD   1,500 mg at 06/11/24 1144   bictegravir-emtricitabine -tenofovir  AF (BIKTARVY ) 50-200-25 MG per tablet 1 tablet  1 tablet Oral Daily Dasie Leonor CROME, MD   1 tablet at 06/11/24 1144   diphenhydrAMINE  (BENADRYL ) 12.5 MG/5ML elixir 12.5 mg  12.5 mg Oral Q6H PRN Dasie Leonor CROME, MD       Or   diphenhydrAMINE  (BENADRYL ) injection 12.5 mg  12.5 mg Intravenous Q6H PRN Dasie Leonor CROME, MD       enoxaparin  (LOVENOX ) injection 40 mg  40 mg Subcutaneous Q24H Dasie Leonor CROME, MD       gabapentin  (NEURONTIN ) capsule 100 mg  100 mg Oral TID Johnson, Kelly R, PA-C   100 mg at 06/11/24 2206   HYDROmorphone  (DILAUDID ) injection 0.5-1 mg  0.5-1 mg Intravenous Q4H PRN Tammy Sor, PA-C   1 mg at 06/08/24 1350   lactated ringers  infusion   Intravenous Continuous  Krishnan, Gokul, MD 100 mL/hr at 06/12/24 0623 New Bag at 06/12/24 9376   lamoTRIgine  (LAMICTAL ) tablet 25 mg  25 mg Oral q morning Vicci Sor SAUNDERS, PA-C   25 mg at 06/11/24 1144   loperamide  (IMODIUM ) capsule 4 mg  4 mg Oral TID PRN Krishnan, Gokul, MD       LORazepam  (ATIVAN ) injection 0.5 mg  0.5 mg Intravenous Q6H PRN Tammy Sor, PA-C   0.5 mg at 06/11/24 1846   melatonin tablet 3 mg  3 mg Oral QHS PRN Dasie Leonor CROME, MD   3 mg at 06/11/24 2206   menthol  (CEPACOL) lozenge 3 mg  1 lozenge Oral PRN Sheldon Standing, MD       methocarbamol  (ROBAXIN ) injection 1,000 mg  1,000 mg Intravenous Q6H PRN Sheldon Standing, MD       methocarbamol  (ROBAXIN ) tablet 500 mg  500 mg Oral QID Dasie Leonor CROME, MD   500 mg at 06/11/24 2206   naphazoline-glycerin  (CLEAR EYES REDNESS) ophth solution 1-2 drop  1-2 drop Both Eyes QID PRN Sheldon Standing, MD       nystatin  (MYCOSTATIN ) 100000 UNIT/ML suspension 500,000 Units  5 mL Oral QID Krishnan, Gokul, MD   500,000 Units at 06/11/24 2206   ondansetron  (ZOFRAN ) injection 4 mg  4 mg Intravenous Q6H PRN Sheldon Standing, MD       ondansetron  (ZOFRAN -ODT) disintegrating tablet 4 mg  4 mg Oral Q6H PRN Dasie Leonor CROME, MD   4 mg at 06/09/24 0745   oxyCODONE  (Oxy IR/ROXICODONE ) immediate release tablet 10-15 mg  10-15 mg Oral Q4H PRN Johnson, Kelly R, PA-C   10 mg at 06/11/24 2206   pantoprazole  (PROTONIX ) EC tablet 40 mg  40 mg Oral Daily Dasie Leonor CROME, MD   40 mg at 06/11/24 1144   phenol (CHLORASEPTIC) mouth spray 2 spray  2 spray Mouth/Throat PRN Sheldon Standing, MD       potassium chloride  SA (KLOR-CON  M) CR tablet 20 mEq  20 mEq Oral Once Krishnan, Gokul, MD       pregabalin  (LYRICA ) capsule 25 mg  25 mg Oral BID Dasie Leonor CROME, MD   25 mg at 06/11/24 2206   prochlorperazine  (COMPAZINE ) injection 5-10 mg  5-10 mg Intravenous Q4H PRN Sheldon Standing, MD       saccharomyces boulardii (FLORASTOR) capsule 250 mg  250 mg Oral BID Krishnan, Gokul, MD   250 mg at 06/11/24 2206    sodium chloride  (OCEAN) 0.65 % nasal spray 1-2 spray  1-2 spray Each Nare Q6H PRN Sheldon Standing, MD        Allergies as of 06/05/2024 - Review Complete 06/05/2024  Allergen Reaction Noted   Bee venom Anaphylaxis, Shortness Of Breath, and Swelling 01/26/2014   Morphine  Hives, Itching, and Other (See Comments) 03/02/2024   Ferrlecit  [na ferric gluc cplx in sucrose] Swelling and Other (See Comments) 01/26/2021   Latex Hives 03/02/2024    Family History  Problem Relation Age of Onset   Throat cancer Mother        smoker   Throat cancer Father        smoker   Hypertension Father    Cirrhosis Father    Hypertension Other    Cancer Other        smoker   Diabetes Other    Asthma Other    Colon cancer Neg Hx    Breast cancer Neg Hx    Ovarian cancer Neg Hx    Pancreatic cancer Neg Hx    Prostate cancer Neg Hx     Social History   Socioeconomic History   Marital status: Single    Spouse name: Not on file   Number of children: 4   Years of education: Not on file   Highest education level: Not on file  Occupational History   Occupation: Production Designer, Theatre/television/film at [employer]  Tobacco Use   Smoking status: Every Day    Current packs/day: 0.25    Average packs/day: 0.3 packs/day for 11.0 years (2.7 ttl pk-yrs)    Types: Cigarettes    Start date: 2015   Smokeless tobacco: Never   Tobacco comments:    5-7 a day  Vaping Use   Vaping status: Never Used  Substance and Sexual Activity   Alcohol use: Yes    Alcohol/week: 0.0 standard drinks of alcohol    Comment: Ocassionally   Drug use: Not Currently    Frequency: 2.0 times per week    Types: Marijuana, MDMA (Ecstacy)   Sexual activity: Not Currently    Partners: Male    Birth control/protection: Condom, Surgical    Comment: pt. given condoms  Other Topics Concern   Not on file  Social History Narrative   Not on file   Social Drivers of Health   Tobacco Use: High Risk (06/05/2024)   Received from [facility]    Patient History    Smoking Tobacco Use: Every Day    Smokeless Tobacco Use: Never    Passive Exposure: Not on file  Financial Resource Strain: Not on file  Food Insecurity: Food Insecurity Present (06/05/2024)   Epic    Worried About Programme Researcher, Broadcasting/film/video in the Last Year: Often true    Ran Out of Food in the Last Year: Often true  Transportation Needs: No Transportation Needs (06/05/2024)   Epic    Lack of Transportation (Medical): No    Lack of Transportation (Non-Medical): No  Physical Activity: Not on file  Stress: Not on file  Social Connections: Not on file  Intimate Partner Violence: Not At Risk (06/05/2024)   Epic    Fear of Current or Ex-Partner: No    Emotionally Abused: No    Physically Abused: No    Sexually Abused: No  Depression (PHQ2-9): Medium Risk (04/06/2024)   Depression (PHQ2-9)    PHQ-2 Score: 8  Alcohol Screen: Not on file  Housing: High Risk (06/05/2024)   Epic    Unable to Pay for Housing in the Last Year: Yes  Number of Times Moved in the Last Year: 3    Homeless in the Last Year: Yes  Utilities: At Risk (06/05/2024)   Epic    Threatened with loss of utilities: Yes  Health Literacy: Not on file    Review of Systems: As per HPI  Physical Exam: Vital signs in last 24 hours: Temp:  [98 F (36.7 C)-99.8 F (37.7 C)] 98 F (36.7 C) (12/22 0631) Pulse Rate:  [71-108] 92 (12/22 0631) Resp:  [15-18] 15 (12/22 0631) BP: (99-110)/(71-86) 104/71 (12/22 0631) SpO2:  [98 %-100 %] 100 % (12/22 0631) Last BM Date : 06/11/24  General:   Alert,  Well-developed, well-nourished, pleasant and cooperative in NAD Head:  Normocephalic and atraumatic. Eyes:  Sclera clear, no icterus.  Prominent pallor Ears:  Normal auditory acuity. Nose:  No deformity, discharge,  or lesions. Mouth:  No deformity or lesions.  Oropharynx pink & moist. Neck:  Supple; no masses or thyromegaly. Lungs:  Clear throughout to auscultation.   No wheezes, crackles, or rhonchi. No  acute distress. Heart:  Regular rate and rhythm; no murmurs, clicks, rubs,  or gallops. Extremities:  Without clubbing or edema. Neurologic:  Alert and  oriented x4;  grossly normal neurologically. Skin:  Intact without significant lesions or rashes. Psych:  Alert and cooperative. Normal mood and affect. Abdomen: Dressing noted in lower abdomen, otherwise soft, mild generalized tenderness        Lab Results: Recent Labs    06/11/24 1529 06/12/24 0550  WBC 2.4* 1.8*  HGB 9.5* 8.2*  HCT 30.6* 26.6*  PLT 476* 403*   BMET Recent Labs    06/11/24 1529 06/12/24 0550  NA 130* 134*  K 3.6 3.8  CL 97* 104  CO2 20* 20*  GLUCOSE 104* 102*  BUN 11 12  CREATININE 0.89 0.84  CALCIUM  8.7* 8.4*   LFT Recent Labs    06/11/24 1529  PROT 8.9*  ALBUMIN 3.0*  AST 17  ALT 7  ALKPHOS 143*  BILITOT 0.4   PT/INR Recent Labs    06/11/24 1529  LABPROT 14.4  INR 1.1    Studies/Results: CT CHEST ABDOMEN PELVIS W CONTRAST Result Date: 06/11/2024 CLINICAL DATA:  Shortness of breath.  Abdominal pain. EXAM: CT CHEST, ABDOMEN, AND PELVIS WITH CONTRAST TECHNIQUE: Multidetector CT imaging of the chest, abdomen and pelvis was performed following the standard protocol during bolus administration of intravenous contrast. RADIATION DOSE REDUCTION: This exam was performed according to the departmental dose-optimization program which includes automated exposure control, adjustment of the mA and/or kV according to patient size and/or use of iterative reconstruction technique. CONTRAST:  OMNIPAQUE  IOHEXOL  300 MG/ML  SOLN COMPARISON:  Chest radiograph dated 03/01/2024. CT abdomen pelvis dated 02/12/2024. FINDINGS: CT CHEST FINDINGS Cardiovascular: There is no cardiomegaly or pericardial effusion. The thoracic aorta is unremarkable. The origins of the great vessels of the aortic arch and the central pulmonary arteries are patent. Mediastinum/Nodes: No hilar or mediastinal adenopathy. The esophagus  is grossly unremarkable. No mediastinal fluid collection. Lungs/Pleura: No focal consolidation, pleural effusion or pneumothorax. The central airways are patent. Musculoskeletal: No acute osseous pathology. CT ABDOMEN PELVIS FINDINGS No intra-abdominal free air or free fluid. Hepatobiliary: The liver is unremarkable. No biliary ductal dilatation. The gallbladder is unremarkable. Pancreas: Unremarkable. No pancreatic ductal dilatation or surrounding inflammatory changes. Spleen: Normal in size without focal abnormality. Adrenals/Urinary Tract: The adrenal glands unremarkable. Small nonobstructing right renal calculi measure up to 5 mm in the interpolar right kidney. No hydronephrosis. The  left kidney is unremarkable. The visualized ureters and urinary bladder appear unremarkable. Stomach/Bowel: There is no bowel obstruction or active inflammation. The appendix is normal. Vascular/Lymphatic: The abdominal aorta and IVC unremarkable. No portal venous gas. There is no adenopathy. Reproductive: Inflammatory changes and thickening of the labia and urethral orifice with diffuse stranding and edema in the subcutaneous soft tissues of the anterior pubic wall and perineum. Correlation with clinical exam recommended. No drainable fluid collection or abscess. There is apparent ulceration of the skin with small pockets of soft tissue air in the anterior pubic/labial region. Other: There is ulceration of the subcutaneous soft tissues across the anterior pelvic wall. No drainable fluid collection. Musculoskeletal: No acute osseous pathology. IMPRESSION: 1. No acute intrathoracic pathology. 2. Inflammatory changes and thickening of the labia and urethral orifice with diffuse stranding and edema in the subcutaneous soft tissues of the anterior pubic wall and perineum. 3. Ulceration of the skin of the anterior pubic and labial area. No drainable fluid collection or abscess. 4. Ulceration of the subcutaneous soft tissues across the  anterior pelvic wall. No fluid collection. 5. No bowel obstruction. Normal appendix. 6. Small nonobstructing right renal calculi. No hydronephrosis. Electronically Signed   By: Vanetta Chou M.D.   On: 06/11/2024 17:51    Impression: Dysphagia to solids and liquids, on full liquid diet, empirically started on nystatin  100,000 unit/mL suspension, 5 mL 4 times a day  Nausea, vomiting, diarrhea: CT does not show inflammation in stomach/small bowel or colon, on Imodium  4 mg 3 times a day as needed and continued on lactated Ringer 's at 100 cc an hour On pantoprazole  40 mg once a day On Zofran  4 mg IV every 6 hours as needed  Normocytic anemia, hemoglobin 8.2, MCV 81.1, reactive thrombocytosis, platelet 409 Leukopenia, WBC 1.8 Mild acidosis, bicarb 20 Mild malnutrition, albumin 3 with total protein elevated at 8.9  History of HIV/AIDS, CD4 count less than 35 on 05/11/2024, on Biktarvy  Comorbidities: Squamous cell carcinoma of abdominal wall  Plan: Will get stool studies for GI pathogen panel and C. Difficile. CT does not show evidence of gastritis/enteritis or colitis.  Plan diagnostic EGD, possible balloon dilation if needed in a.m. tomorrow. Risk and the benefits of the procedure were discussed with the patient in details. Understands and verbalizes consent..    LOS: 6 days   Beverly Manas, MD  06/12/2024, 9:47 AM

## 2024-06-12 NOTE — Plan of Care (Signed)
   Problem: Coping: Goal: Level of anxiety will decrease Outcome: Progressing   Problem: Pain Managment: Goal: General experience of comfort will improve and/or be controlled Outcome: Progressing   Problem: Safety: Goal: Ability to remain free from injury will improve Outcome: Progressing

## 2024-06-13 ENCOUNTER — Inpatient Hospital Stay: Admitting: Obstetrics & Gynecology

## 2024-06-13 ENCOUNTER — Encounter (HOSPITAL_COMMUNITY): Payer: Self-pay | Admitting: Surgery

## 2024-06-13 ENCOUNTER — Inpatient Hospital Stay (HOSPITAL_COMMUNITY): Payer: Self-pay | Admitting: Anesthesiology

## 2024-06-13 ENCOUNTER — Encounter (HOSPITAL_COMMUNITY): Admission: AD | Disposition: A | Discharge status: Home / Self Care | Payer: Self-pay | Source: Ambulatory Visit

## 2024-06-13 DIAGNOSIS — K229 Disease of esophagus, unspecified: Secondary | ICD-10-CM

## 2024-06-13 DIAGNOSIS — J45909 Unspecified asthma, uncomplicated: Secondary | ICD-10-CM | POA: Diagnosis not present

## 2024-06-13 DIAGNOSIS — R8290 Unspecified abnormal findings in urine: Secondary | ICD-10-CM | POA: Diagnosis not present

## 2024-06-13 DIAGNOSIS — F418 Other specified anxiety disorders: Secondary | ICD-10-CM

## 2024-06-13 DIAGNOSIS — R1319 Other dysphagia: Secondary | ICD-10-CM | POA: Diagnosis not present

## 2024-06-13 DIAGNOSIS — R197 Diarrhea, unspecified: Secondary | ICD-10-CM | POA: Diagnosis not present

## 2024-06-13 HISTORY — PX: ESOPHAGOGASTRODUODENOSCOPY: SHX5428

## 2024-06-13 LAB — BASIC METABOLIC PANEL WITH GFR
Anion gap: 8 (ref 5–15)
BUN: 11 mg/dL (ref 6–20)
CO2: 20 mmol/L — ABNORMAL LOW (ref 22–32)
Calcium: 8.3 mg/dL — ABNORMAL LOW (ref 8.9–10.3)
Chloride: 105 mmol/L (ref 98–111)
Creatinine, Ser: 0.78 mg/dL (ref 0.44–1.00)
GFR, Estimated: >60 mL/min
Glucose, Bld: 93 mg/dL (ref 70–99)
Potassium: 3.7 mmol/L (ref 3.5–5.1)
Sodium: 134 mmol/L — ABNORMAL LOW (ref 135–145)

## 2024-06-13 LAB — CBC
HCT: 25.1 % — ABNORMAL LOW (ref 36.0–46.0)
Hemoglobin: 7.7 g/dL — ABNORMAL LOW (ref 12.0–15.0)
MCH: 25 pg — ABNORMAL LOW (ref 26.0–34.0)
MCHC: 30.7 g/dL (ref 30.0–36.0)
MCV: 81.5 fL (ref 80.0–100.0)
Platelets: 378 K/uL (ref 150–400)
RBC: 3.08 MIL/uL — ABNORMAL LOW (ref 3.87–5.11)
RDW: 17.5 % — ABNORMAL HIGH (ref 11.5–15.5)
WBC: 1.7 K/uL — ABNORMAL LOW (ref 4.0–10.5)
nRBC: 0 % (ref 0.0–0.2)

## 2024-06-13 LAB — C DIFFICILE (CDIFF) QUICK SCRN (NO PCR REFLEX)
C Diff antigen: POSITIVE — AB
C Diff toxin: NEGATIVE

## 2024-06-13 LAB — GASTROINTESTINAL PANEL BY PCR, STOOL (REPLACES STOOL CULTURE)
Adenovirus F40/41: NOT DETECTED
Astrovirus: NOT DETECTED
Campylobacter species: NOT DETECTED
Cryptosporidium: NOT DETECTED
Cyclospora cayetanensis: NOT DETECTED
Entamoeba histolytica: NOT DETECTED
Enteroaggregative E coli (EAEC): NOT DETECTED
Enteropathogenic E coli (EPEC): NOT DETECTED
Enterotoxigenic E coli (ETEC): NOT DETECTED
Giardia lamblia: NOT DETECTED
Norovirus GI/GII: DETECTED — AB
Plesimonas shigelloides: NOT DETECTED
Rotavirus A: NOT DETECTED
Salmonella species: NOT DETECTED
Sapovirus (I, II, IV, and V): DETECTED — AB
Shiga like toxin producing E coli (STEC): NOT DETECTED
Shigella/Enteroinvasive E coli (EIEC): NOT DETECTED
Vibrio cholerae: NOT DETECTED
Vibrio species: NOT DETECTED
Yersinia enterocolitica: NOT DETECTED

## 2024-06-13 LAB — FOLATE: Folate: 5.6 ng/mL — ABNORMAL LOW

## 2024-06-13 LAB — MAGNESIUM: Magnesium: 1.7 mg/dL (ref 1.7–2.4)

## 2024-06-13 MED ORDER — PROPOFOL 10 MG/ML IV BOLUS
INTRAVENOUS | Status: AC
  Filled 2024-06-13: qty 20

## 2024-06-13 MED ORDER — LIDOCAINE 2% (20 MG/ML) 5 ML SYRINGE
INTRAMUSCULAR | Status: DC | PRN
  Administered 2024-06-13: 40 mg via INTRAVENOUS

## 2024-06-13 MED ORDER — SODIUM CHLORIDE 0.9 % IV SOLN: INTRAVENOUS | Status: DC

## 2024-06-13 MED ORDER — PROPOFOL 500 MG/50ML IV EMUL
INTRAVENOUS | Status: DC | PRN
  Administered 2024-06-13: 40 mg via INTRAVENOUS
  Administered 2024-06-13: 10 mg via INTRAVENOUS
  Administered 2024-06-13: 150 ug/kg/min via INTRAVENOUS

## 2024-06-13 MED ORDER — LACTATED RINGERS IV SOLN: INTRAVENOUS | Status: DC | PRN

## 2024-06-13 MED ORDER — DEXMEDETOMIDINE HCL IN NACL 80 MCG/20ML IV SOLN
INTRAVENOUS | Status: DC | PRN
  Administered 2024-06-13: 4 ug via INTRAVENOUS

## 2024-06-13 MED ORDER — FOLIC ACID 1 MG PO TABS
1.0000 mg | ORAL_TABLET | Freq: Every day | ORAL | Status: DC
  Administered 2024-06-14 – 2024-06-27 (×14): 1 mg via ORAL
  Filled 2024-06-13 (×7): qty 1

## 2024-06-13 MED ORDER — MAGNESIUM SULFATE 4 GM/100ML IV SOLN
4.0000 g | Freq: Once | INTRAVENOUS | Status: AC
  Administered 2024-06-13: 4 g via INTRAVENOUS
  Filled 2024-06-13: qty 100

## 2024-06-13 MED ORDER — DEXMEDETOMIDINE HCL IN NACL 80 MCG/20ML IV SOLN
INTRAVENOUS | Status: AC
  Filled 2024-06-13: qty 20

## 2024-06-13 MED ORDER — PROPOFOL 500 MG/50ML IV EMUL
INTRAVENOUS | Status: AC
  Filled 2024-06-13: qty 50

## 2024-06-13 NOTE — Plan of Care (Signed)
 Egd suggestive of esophageal candida; no ulceration/mass/lesion seen otherwise  Assymptomatic bacteriura noted  Chronic anemia/leukopenia  Acute n/v/d without fever. Will discuss with GI if monitor vs colonoscopy. From my standpoint if sx improving can monitor  Cdiff testing negative  Gi pcr in process

## 2024-06-13 NOTE — Progress Notes (Signed)
 PT Screen Note  Patient Details Name: Beverly [name] MRN: [id number] DOB: 18-Apr-1981   Cancelled Treatment:    Reason Eval/Treat Not Completed: PT screened, no needs identified, will sign off  Noted documentation that pt ambulating independently in room - confirmed with RN.  Spoke with pt who reports she is mobilizing in room, has not walked in hallway but frequently up to restroom.  Does report low energy due to low food intake but feels balance and mobility are good and denies PT needs.  Encouraged continued frequent mobility and increasing distance as able.  No PT needs.   Benjiman, PT Acute Rehab [facility] [phone]  [name] Mulberry 06/13/2024, 4:14 PM

## 2024-06-13 NOTE — Op Note (Signed)
 [facility] Patient Name: Beverly [name] Procedure Date: 06/13/2024 MRN: [id number] Attending MD: [name] , MD, [id number] Date of Birth: 06-Apr-1981 CSN: [id number] Age: 43 Admit Type: Inpatient Procedure:                Upper GI endoscopy Indications:              Dysphagia, Diarrhea, Nausea with vomiting, history                            of HIV/AIDS Providers:                [name], MD, Almarie Masters, RN, Olam Riedel, RN,                            [facility] [place], Technician Referring MD:             [facility] Medicines:                Monitored Anesthesia Care Complications:            No immediate complications. Estimated blood loss:                            Minimal. Estimated Blood Loss:     Estimated blood loss was minimal. Procedure:                Pre-Anesthesia Assessment:                           - Prior to the procedure, a History and Physical                            was performed, and patient medications and                            allergies were reviewed. The patient's tolerance of                            previous anesthesia was also reviewed. The risks                            and benefits of the procedure and the sedation                            options and risks were discussed with the patient.                            All questions were answered, and informed consent                            was obtained. Prior Anticoagulants: The patient has                            taken no anticoagulant or antiplatelet agents. ASA  Grade Assessment: III - A patient with severe                            systemic disease. After reviewing the risks and                            benefits, the patient was deemed in satisfactory                            condition to undergo the procedure.                           After obtaining informed consent, the endoscope was                            passed  under direct vision. Throughout the                            procedure, the patient's blood pressure, pulse, and                            oxygen saturations were monitored continuously. The                            [id number]-H190 ([id number] Olympus endoscope was introduced                            through the mouth, and advanced to the second part                            of duodenum. The upper GI endoscopy was                            accomplished without difficulty. The patient                            tolerated the procedure well. Scope In: Scope Out: Findings:      A few patchy, white plaques were found in the proximal esophagus.      Biopsies were obtained from the proximal and distal esophagus with cold       forceps for histology of suspected eosinophilic esophagitis and also for       evaluation for candida.      The Z-line was regular and was found 40 cm from the incisors.      Patchy mildly erythematous mucosa without bleeding was found in the       gastric body and in the gastric antrum. Biopsies were taken with a cold       forceps for Helicobacter pylori testing.      The cardia and gastric fundus were normal on retroflexion.      The examined duodenum was normal. Biopsies for histology were taken with       a cold forceps for evaluation of celiac disease. Impression:               - Esophageal plaques were found, suspicious for  candidiasis.                           - Z-line regular, 40 cm from the incisors.                           - Erythematous mucosa in the gastric body and                            antrum. Biopsied.                           - Normal examined duodenum. Biopsied.                           - Biopsies were taken with a cold forceps for                            evaluation of eosinophilic esophagitis. Moderate Sedation:      Patient did not receive moderate sedation for this procedure, but       instead received  monitored anesthesia care. Recommendation:           - Resume regular diet.                           - Await pathology results.                           - If patient has recurrence of diarrhea, recommend                            using vancomycin  125 mg four times a day for 14                            days(as she has c diff Ag positive stool). Procedure Code(s):        --- Professional ---                           [phone], Esophagogastroduodenoscopy, flexible,                            transoral; with biopsy, single or multiple Diagnosis Code(s):        --- Professional ---                           K22.9, Disease of esophagus, unspecified                           K31.89, Other diseases of stomach and duodenum                           R13.10, Dysphagia, unspecified                           R19.7, Diarrhea, unspecified  R11.2, Nausea with vomiting, unspecified CPT copyright 2022 [employer]. All rights reserved. The codes documented in this report are preliminary and upon coder review may  be revised to meet current compliance requirements. [name], MD 06/13/2024 12:33:17 PM This report has been signed electronically. Number of Addenda: 0

## 2024-06-13 NOTE — Progress Notes (Signed)
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  Scheduled for EGD today   Orders in place:  Yes   Communication with surgical team if no orders: n/a  Labs, test, and orders reviewed: Y  Requires surgical clearance:  No  What type of clearance: n/a  Clearance received: n/a  Barriers noted:n/a   Intervention provided by [facility] team: n/a  Barrier resolved:  not applicable   -------------------------------------------------------------------------------------------------------------------  [employer], Ronal DELENA Bald Please contact us  directly via secure chat (search for [facility]) or by calling us  at [phone] [facility]).  "

## 2024-06-13 NOTE — Anesthesia Preprocedure Evaluation (Addendum)
 "                                  Anesthesia Evaluation  Patient identified by MRN, date of birth, ID band Patient awake    Reviewed: Allergy & Precautions, NPO status , [name] Chart, lab work & pertinent test results  Airway Mallampati: II  TM Distance: >3 FB Neck ROM: Full    Dental  (+) Loose, Dental Advisory Given,    Pulmonary asthma , Current Smoker and Patient abstained from smoking.   Pulmonary exam normal breath sounds clear to auscultation       Cardiovascular negative cardio ROS Normal cardiovascular exam Rhythm:Regular Rate:Normal     Neuro/Psych  Headaches PSYCHIATRIC DISORDERS Anxiety Depression       GI/Hepatic negative GI ROS, Neg liver ROS,,,  Endo/Other  negative endocrine ROS    Renal/GU negative Renal ROS  negative genitourinary   Musculoskeletal  (+) Arthritis ,    Abdominal   Peds  Hematology  (+) Blood dyscrasia, anemia , HIVKaposi's sarcoma  Lab Results      Component                Value               Date                      WBC                      1.7 (L)             06/13/2024                HGB                      7.7 (L)             06/13/2024                HCT                      25.1 (L)            06/13/2024                MCV                      81.5                06/13/2024                PLT                      378                 06/13/2024              Anesthesia Other Findings 43 y.o. 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 She underwent incision and drainage of the abdominal wall wound on 12/15.  Based on microbiology it looks like she grew out MRSA and Pseudomonas from the wound cultures. Patient started complaining of nausea vomiting dizziness and loose stools.  Patient also mention symptoms suggestive of dysphagia  Reproductive/Obstetrics  Anesthesia  Physical Anesthesia Plan  ASA: 3  Anesthesia Plan: MAC   Post-op Pain Management:    Induction: Intravenous  PONV Risk Score and Plan: Propofol  infusion and Treatment may vary due to age or medical condition  Airway Management Planned: Natural Airway  Additional Equipment:   Intra-op Plan:   Post-operative Plan:   Informed Consent: I have reviewed the patients History and Physical, chart, labs and discussed the procedure including the risks, benefits and alternatives for the proposed anesthesia with the patient or authorized representative who has indicated his/her understanding and acceptance.     Dental advisory given  Plan Discussed with: CRNA  Anesthesia Plan Comments:          Anesthesia Quick Evaluation  "

## 2024-06-13 NOTE — Transfer of Care (Signed)
 Immediate Anesthesia Transfer of Care Note  Patient: Beverly [name]  Procedure(s) Performed: EGD (ESOPHAGOGASTRODUODENOSCOPY)  Patient Location: Endoscopy Unit  Anesthesia Type:MAC  Level of Consciousness: awake, alert , oriented, and patient cooperative  Airway & Oxygen Therapy: Patient Spontanous Breathing and Patient connected to face mask oxygen  Post-op Assessment: Report given to RN and Post -op Vital signs reviewed and stable  Post vital signs: Reviewed and stable  Last Vitals:  Vitals Value Taken Time  BP 98/55 06/13/24 12:33  Temp 36.6 C 06/13/24 12:32  Pulse 88 06/13/24 12:35  Resp 21 06/13/24 12:35  SpO2 100 % 06/13/24 12:35  Vitals shown include unfiled device data.  Last Pain:  Vitals:   06/13/24 1232  TempSrc: Temporal  PainSc: 0-No pain      Patients Stated Pain Goal: 3 (06/12/24 2339)  Complications: There were no known notable events for this encounter.

## 2024-06-13 NOTE — Progress Notes (Signed)
 PT Cancellation Note  Patient Details Name: Beverly [name] MRN: [id number] DOB: [birth year]   Cancelled Treatment:    Reason Eval/Treat Not Completed: Patient at procedure or test/unavailable Off Floor for EGD.  Will f/u as able.  Benjiman, PT Acute Rehab [facility] [phone]   [name] Mulberry 06/13/2024, 12:08 PM

## 2024-06-13 NOTE — Plan of Care (Signed)
" °  Problem: Education: Goal: Knowledge of General Education information will improve Description: Including pain rating scale, medication(s)/side effects and non-pharmacologic comfort measures Outcome: Adequate for Discharge   Problem: Health Behavior/Discharge Planning: Goal: Ability to manage health-related needs will improve Outcome: Progressing   Problem: Clinical Measurements: Goal: Ability to maintain clinical measurements within normal limits will improve Outcome: Progressing Goal: Will remain free from infection Outcome: Progressing Goal: Diagnostic test results will improve Outcome: Progressing Goal: Respiratory complications will improve Outcome: Progressing Goal: Cardiovascular complication will be avoided Outcome: Adequate for Discharge   Problem: Activity: Goal: Risk for activity intolerance will decrease Outcome: Adequate for Discharge   Problem: Nutrition: Goal: Adequate nutrition will be maintained Outcome: Progressing   Problem: Coping: Goal: Level of anxiety will decrease Outcome: Progressing   Problem: Pain Managment: Goal: General experience of comfort will improve and/or be controlled Outcome: Progressing   Problem: Safety: Goal: Ability to remain free from injury will improve Outcome: Progressing   Problem: Skin Integrity: Goal: Risk for impaired skin integrity will decrease Outcome: Progressing   "

## 2024-06-13 NOTE — Progress Notes (Signed)
 8 Days Post-Op   Subjective/Chief Complaint: Complains of diarrhea and nausea   Objective: Vital signs in last 24 hours: Temp:  [97.7 F (36.5 C)-98.9 F (37.2 C)] 97.7 F (36.5 C) (12/23 0627) Pulse Rate:  [83-99] 83 (12/23 0627) Resp:  [16-18] 16 (12/23 0627) BP: (97-114)/(69-94) 114/94 (12/23 0627) SpO2:  [95 %-100 %] 99 % (12/23 0627) Last BM Date : 06/12/24  Intake/Output from previous day: 12/22 0701 - 12/23 0700 In: 2002.9 [P.O.:560; I.V.:1442.9] Out: 1300 [Urine:400; Stool:900] Intake/Output this shift: No intake/output data recorded.  General appearance: alert and cooperative Resp: clear to auscultation bilaterally Cardio: regular rate and rhythm GI: soft, nontender. Not distended. Wound clean  Lab Results:  Recent Labs    06/12/24 0550 06/13/24 0333  WBC 1.8* 1.7*  HGB 8.2* 7.7*  HCT 26.6* 25.1*  PLT 403* 378   BMET Recent Labs    06/12/24 0550 06/13/24 0333  NA 134* 134*  K 3.8 3.7  CL 104 105  CO2 20* 20*  GLUCOSE 102* 93  BUN 12 11  CREATININE 0.84 0.78  CALCIUM  8.4* 8.3*   PT/INR Recent Labs    06/11/24 1529  LABPROT 14.4  INR 1.1   ABG No results for input(s): PHART, HCO3 in the last 72 hours.  Invalid input(s): PCO2, PO2  Studies/Results: CT CHEST ABDOMEN PELVIS W CONTRAST Result Date: 06/11/2024 CLINICAL DATA:  Shortness of breath.  Abdominal pain. EXAM: CT CHEST, ABDOMEN, AND PELVIS WITH CONTRAST TECHNIQUE: Multidetector CT imaging of the chest, abdomen and pelvis was performed following the standard protocol during bolus administration of intravenous contrast. RADIATION DOSE REDUCTION: This exam was performed according to the departmental dose-optimization program which includes automated exposure control, adjustment of the mA and/or kV according to patient size and/or use of iterative reconstruction technique. CONTRAST:  OMNIPAQUE  IOHEXOL  300 MG/ML  SOLN COMPARISON:  Chest radiograph dated 03/01/2024. CT abdomen  pelvis dated 02/12/2024. FINDINGS: CT CHEST FINDINGS Cardiovascular: There is no cardiomegaly or pericardial effusion. The thoracic aorta is unremarkable. The origins of the great vessels of the aortic arch and the central pulmonary arteries are patent. Mediastinum/Nodes: No hilar or mediastinal adenopathy. The esophagus is grossly unremarkable. No mediastinal fluid collection. Lungs/Pleura: No focal consolidation, pleural effusion or pneumothorax. The central airways are patent. Musculoskeletal: No acute osseous pathology. CT ABDOMEN PELVIS FINDINGS No intra-abdominal free air or free fluid. Hepatobiliary: The liver is unremarkable. No biliary ductal dilatation. The gallbladder is unremarkable. Pancreas: Unremarkable. No pancreatic ductal dilatation or surrounding inflammatory changes. Spleen: Normal in size without focal abnormality. Adrenals/Urinary Tract: The adrenal glands unremarkable. Small nonobstructing right renal calculi measure up to 5 mm in the interpolar right kidney. No hydronephrosis. The left kidney is unremarkable. The visualized ureters and urinary bladder appear unremarkable. Stomach/Bowel: There is no bowel obstruction or active inflammation. The appendix is normal. Vascular/Lymphatic: The abdominal aorta and IVC unremarkable. No portal venous gas. There is no adenopathy. Reproductive: Inflammatory changes and thickening of the labia and urethral orifice with diffuse stranding and edema in the subcutaneous soft tissues of the anterior pubic wall and perineum. Correlation with clinical exam recommended. No drainable fluid collection or abscess. There is apparent ulceration of the skin with small pockets of soft tissue air in the anterior pubic/labial region. Other: There is ulceration of the subcutaneous soft tissues across the anterior pelvic wall. No drainable fluid collection. Musculoskeletal: No acute osseous pathology. IMPRESSION: 1. No acute intrathoracic pathology. 2. Inflammatory changes  and thickening of the labia and urethral orifice  with diffuse stranding and edema in the subcutaneous soft tissues of the anterior pubic wall and perineum. 3. Ulceration of the skin of the anterior pubic and labial area. No drainable fluid collection or abscess. 4. Ulceration of the subcutaneous soft tissues across the anterior pelvic wall. No fluid collection. 5. No bowel obstruction. Normal appendix. 6. Small nonobstructing right renal calculi. No hydronephrosis. Electronically Signed   By: Vanetta Chou M.D.   On: 06/11/2024 17:51    Anti-infectives: Anti-infectives (From admission, onward)    Start     Dose/Rate Route Frequency Ordered Stop   06/12/24 1330  fluconazole  (DIFLUCAN ) tablet 200 mg        200 mg Oral Daily 06/12/24 1230 07/03/24 0959   06/06/24 1000  bictegravir-emtricitabine -tenofovir  AF (BIKTARVY ) 50-200-25 MG per tablet 1 tablet        1 tablet Oral Daily 06/05/24 1927     06/06/24 1000  atovaquone  (MEPRON ) 750 MG/5ML suspension 1,500 mg        1,500 mg Oral Daily 06/05/24 1927     06/05/24 1645  vancomycin  (VANCOCIN ) IVPB 1000 mg/200 mL premix        1,000 mg 200 mL/hr over 60 Minutes Intravenous On call to O.R. 06/05/24 1628 06/05/24 2040   06/05/24 1631  vancomycin  (VANCOCIN ) 1-5 GM/200ML-% IVPB       Note to Pharmacy: [name] W: cabinet override      06/05/24 1631 06/06/24 0444       Assessment/Plan: s/p Procedures: INCISION AND DRAINAGE OF ABDOMINAL WALL ASBCESS (Bilateral) POD 8, s/p drainage of wound infection by Dr. 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 Rogelio (GYN)  Advance diet. Start fulls today. No sbo on CT 2 days ago Continue wet to dry dressing changes Off abx. Wound clean and wbc not elevated AIDS per ID Will follow  LOS: 7 days    Deward Curvin III 06/13/2024

## 2024-06-13 NOTE — Anesthesia Procedure Notes (Signed)
 Procedure Name: MAC Date/Time: 06/13/2024 12:15 PM  Performed by: Erick Fitz, CRNAPre-anesthesia Checklist: Patient identified, Emergency Drugs available, Suction available, Patient being monitored and Timeout performed Patient Re-evaluated:Patient Re-evaluated prior to induction Oxygen Delivery Method: Simple face mask Preoxygenation: Pre-oxygenation with 100% oxygen (POM mask) Induction Type: IV induction Placement Confirmation: positive ETCO2 and CO2 detector Dental Injury: Teeth and Oropharynx as per pre-operative assessment

## 2024-06-13 NOTE — Interval H&P Note (Signed)
 History and Physical Interval Note: 43/female with HIV with dysphagia, nausea, vomiting and diarrhea for EGD with possible balloon dilation with propofol .  06/13/2024 12:00 PM  Beverly [name]  has presented today for EGD with possible balloon dilation with propofol , with the diagnosis of Dysphagia, nausea, vomiting, diarrhea, HIV/AIDS, squamous cell carcinoma of lower abdomen, MRSA and Pseudomonas wound infection.  The various methods of treatment have been discussed with the patient and family. After consideration of risks, benefits and other options for treatment, the patient has consented to  Procedures: EGD (ESOPHAGOGASTRODUODENOSCOPY) (N/A) as a surgical intervention.  The patient's history has been reviewed, patient examined, no change in status, stable for surgery.  I have reviewed the patient's chart and labs.  Questions were answered to the patient's satisfaction.     [name]

## 2024-06-13 NOTE — Progress Notes (Signed)
 "  [facility] PROGRESS NOTE   Beverly [name] FMW:[id number] DOB: [birth year] DOA: 06/05/2024  PCP: Melvenia Corean SAILOR, NP  Brief History: 43 y.o. 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  She was discharged home after that surgical procedure.  She presented to the general surgeons office on 12/15 with complaints of increased pain in her wound and persistent drainage from the wound.  She was directly admitted to the hospital.  There was concern for wound infection.  She underwent incision and drainage of the abdominal wall wound on 12/15.  Based on microbiology it looks like she grew out MRSA and Pseudomonas from the wound cultures.  Because of adequate drainage and clean wound postoperatively she was not continued on antibiotics.  She was given vancomycin  for surgical prophylaxis.  Plan was for wound care education and then subsequently discharge home.  Hospitalist service called for consultation since patient started complaining of nausea vomiting dizziness and loose stools.  Patient also mention symptoms suggestive of dysphagia.    Consultants: Gastroenterology.  Infectious disease  Procedures: None yet   Subjective/Interval History: Patient somnolent this morning though easily arousable.  Reports that she has not seen any improvement in her symptoms.  Still experiencing loose stools.  Still having difficulty swallowing along with nausea.  She mentioned that she had 2 episodes of emesis yesterday.    Assessment/Plan:  Nausea vomiting diarrhea/abdominal pain Abdominal pain appears to be mostly secondary to her abdominal wound and recent surgery.   CT scan did not raise concern for inflammatory changes in the perineum, anterior pubic wall, labia and urethral orifice.  However no fluid collection was noted.  Ulceration of the skin of the anterior pubic and labial area was also noted.  According to general  surgery these are expected findings considering her recent surgical interventions. Patient was seen by gastroenterology for her symptoms.  Stool studies have been ordered.  C. difficile antigen is noted to be positive but no toxin noted.  Will defer management to gastroenterology and ID. No small bowel obstruction noted on CT scan.  Dysphagia Etiology unclear.  She does have history of HIV.  Differential diagnosis is broad.  She has never had endoscopy previously.  There is some whitish plaques in her mouth raising concern for candidiasis.  She was started on nystatin  which was changed over to Diflucan . GI has seen the patient and plan is for upper endoscopy today.    Abdominal wound/squamous cell carcinoma abdominal wall She is status post excision of abdominal wound which was positive for squamous cell carcinoma.  Subsequently developed wound infection and underwent I&D.  Wound cultures grew MRSA and Pseudomonas however since most of the infection was surgically removed it was felt by surgery team that antibiotics were not necessary.  She has not been on antibiotics.   CT scan shows inflammatory changes without any fluid collection.  She is afebrile.  She is leukopenic.  Abnormal UA Abnormal UA noted.  She does complain of mild dysuria.  Urine culture growing gram-negative bacteria.  Await final identification and sensitivities. Defer antibacterials to infectious disease.     HIV/AIDS She is on Biktarvy  and atovaquone .  She said that she has been compliant with her medications.  CD4 counts are pending.  ID to see.     Dizziness/borderline low blood pressure Likely due to hypovolemia.  Improved with IV fluids.  Continue to monitor.  Leukopenia/normocytic anemia/folic acid  deficiency Most  likely due to HIV/chronic disease.  No evidence of overt bleeding.  Recently checked iron panel in September reviewed.  Ferritin was 92, iron 63, TIBC 259, percent saturation 24. Folic acid  level noted to be  low and will be supplemented. Drop in hemoglobin is dilutional.  No evidence of overt blood loss.  Transfuse if it drops below 7.  Hypomagnesemia/hyponatremia Magnesium  has been supplemented.  Sodium level has improved with IV hydration.   DVT Prophylaxis: Lovenox  Code Status: Full code Family Communication: Discussed with patient Disposition Plan: To be determined.  Needs to be mobilized.     Medications: Scheduled:  acetaminophen   1,000 mg Oral Q6H   atovaquone   1,500 mg Oral Daily   bictegravir-emtricitabine -tenofovir  AF  1 tablet Oral Daily   enoxaparin  (LOVENOX ) injection  40 mg Subcutaneous Q24H   fluconazole   200 mg Oral Daily   folic acid   1 mg Oral Daily   gabapentin   100 mg Oral TID   lamoTRIgine   25 mg Oral q morning   methocarbamol   500 mg Oral QID   pantoprazole   40 mg Oral Daily   pregabalin   25 mg Oral BID   Continuous:  lactated ringers  75 mL/hr at 06/12/24 1933   magnesium  sulfate bolus IVPB 4 g (06/13/24 0938)   PRN:acetaminophen , acetaminophen , albuterol , alum & mag hydroxide-simeth, diphenhydrAMINE  **OR** diphenhydrAMINE , HYDROmorphone  (DILAUDID ) injection, loperamide , LORazepam , melatonin, menthol , methocarbamol  (ROBAXIN ) injection, naphazoline-glycerin , ondansetron  (ZOFRAN ) IV, ondansetron  **OR** [DISCONTINUED] ondansetron  (ZOFRAN ) IV, oxyCODONE , phenol, prochlorperazine , sodium chloride   Antibiotics: Anti-infectives (From admission, onward)    Start     Dose/Rate Route Frequency Ordered Stop   06/12/24 1330  fluconazole  (DIFLUCAN ) tablet 200 mg        200 mg Oral Daily 06/12/24 1230 07/03/24 0959   06/06/24 1000  bictegravir-emtricitabine -tenofovir  AF (BIKTARVY ) 50-200-25 MG per tablet 1 tablet        1 tablet Oral Daily 06/05/24 1927     06/06/24 1000  atovaquone  (MEPRON ) 750 MG/5ML suspension 1,500 mg        1,500 mg Oral Daily 06/05/24 1927     06/05/24 1645  vancomycin  (VANCOCIN ) IVPB 1000 mg/200 mL premix        1,000 mg 200 mL/hr over 60  Minutes Intravenous On call to O.R. 06/05/24 1628 06/05/24 2040   06/05/24 1631  vancomycin  (VANCOCIN ) 1-5 GM/200ML-% IVPB       Note to Pharmacy: [name] W: cabinet override      06/05/24 1631 06/06/24 0444       Objective:  Vital Signs  Vitals:   06/12/24 0631 06/12/24 1413 06/12/24 2150 06/13/24 0627  BP: 104/71 97/69 102/73 (!) 114/94  Pulse: 92 95 99 83  Resp: 15 17 18 16   Temp: 98 F (36.7 C) 98.6 F (37 C) 98.9 F (37.2 C) 97.7 F (36.5 C)  TempSrc: Oral Oral Oral Oral  SpO2: 100% 95% 100% 99%  Weight:      Height:        Intake/Output Summary (Last 24 hours) at 06/13/2024 1049 Last data filed at 06/13/2024 1000 Gross per 24 hour  Intake 1782.86 ml  Output 800 ml  Net 982.86 ml   Filed Weights   06/05/24 1625  Weight: 53 kg    General appearance: Awake alert.  In no distress Resp: Clear to auscultation bilaterally.  Normal effort Cardio: S1-S2 is normal regular.  No S3-S4.  No rubs murmurs or bruit   Lab Results:  Data Reviewed: I have personally reviewed following labs and reports  of the imaging studies  CBC: Recent Labs  Lab 06/09/24 0327 06/11/24 1529 06/12/24 0550 06/13/24 0333  WBC 3.2* 2.4* 1.8* 1.7*  NEUTROABS  --  1.4*  --   --   HGB 9.4* 9.5* 8.2* 7.7*  HCT 30.9* 30.6* 26.6* 25.1*  MCV 82.0 80.3 81.1 81.5  PLT 519* 476* 403* 378    Basic Metabolic Panel: Recent Labs  Lab 06/09/24 0327 06/11/24 1529 06/12/24 0550 06/13/24 0333  NA 133* 130* 134* 134*  K 3.4* 3.6 3.8 3.7  CL 100 97* 104 105  CO2 23 20* 20* 20*  GLUCOSE 102* 104* 102* 93  BUN 17 11 12 11   CREATININE 1.06* 0.89 0.84 0.78  CALCIUM  8.6* 8.7* 8.4* 8.3*  MG  --  1.4* 2.4 1.7  PHOS  --  3.2  --   --     GFR: Estimated Creatinine Clearance: 69.8 mL/min (by C-G formula based on SCr of 0.78 mg/dL).  Liver Function Tests: Recent Labs  Lab 06/11/24 1529  AST 17  ALT 7  ALKPHOS 143*  BILITOT 0.4  PROT 8.9*  ALBUMIN 3.0*    Recent Labs  Lab  06/11/24 1529  LIPASE 22   Coagulation Profile: Recent Labs  Lab 06/11/24 1529  INR 1.1    Recent Results (from the past 240 hours)  Fungus Culture With Stain     Status: None (Preliminary result)   Collection Time: 06/05/24  7:10 PM   Specimen: Abdomen; Abscess  Result Value Ref Range Status   Fungus Stain Final report  Final    Comment: (NOTE) Performed At: [facility] [address] [place], [place] [id number] Jennette Shorter MD Ey:[id number]    Fungus (Mycology) Culture PENDING  Incomplete   Fungal Source WOUND  Final    Comment: ABDOMINAL WALL Performed at [facility], [address] [place], [place] [address]   Aerobic/Anaerobic Culture w Gram Stain (surgical/deep wound)     Status: None   Collection Time: 06/05/24  7:10 PM   Specimen: Abdomen; Abscess  Result Value Ref Range Status   Specimen Description   Final    WOUND ABDOMINAL WALL Performed at [facility], [address], [place], [place] [address]    Special Requests   Final    NONE Performed at [facility], [address], [place], [place] [address]    Gram Stain   Final    FEW WBC PRESENT, PREDOMINANTLY PMN FEW GRAM POSITIVE COCCI FEW GRAM NEGATIVE RODS    Culture   Final    RARE METHICILLIN RESISTANT STAPHYLOCOCCUS AUREUS RARE PSEUDOMONAS AERUGINOSA FEW GROUP B STREP(S.AGALACTIAE)ISOLATED TESTING AGAINST S. AGALACTIAE NOT ROUTINELY PERFORMED DUE TO PREDICTABILITY OF AMP/PEN/VAN SUSCEPTIBILITY. MODERATE BACTEROIDES FRAGILIS BETA LACTAMASE POSITIVE Performed at [facility], [address], [place], [place] [address]    Report Status 06/09/2024 FINAL  Final   Organism ID, Bacteria METHICILLIN RESISTANT STAPHYLOCOCCUS AUREUS  Final   Organism ID, Bacteria PSEUDOMONAS AERUGINOSA  Final      Susceptibility   Methicillin resistant staphylococcus aureus - MIC*    CIPROFLOXACIN  <=0.5 SENSITIVE Sensitive     ERYTHROMYCIN >=8  RESISTANT Resistant     GENTAMICIN <=0.5 SENSITIVE Sensitive     OXACILLIN >=4 RESISTANT Resistant     TETRACYCLINE >=16 RESISTANT Resistant     VANCOMYCIN  <=0.5 SENSITIVE Sensitive     TRIMETH /SULFA  <=10 SENSITIVE Sensitive     CLINDAMYCIN  <=0.25 SENSITIVE Sensitive     RIFAMPIN <=0.5 SENSITIVE  Sensitive     Inducible Clindamycin  NEGATIVE Sensitive     LINEZOLID 2 SENSITIVE Sensitive     * RARE METHICILLIN RESISTANT STAPHYLOCOCCUS AUREUS   Pseudomonas aeruginosa - MIC*    MEROPENEM 4 INTERMEDIATE Intermediate     CIPROFLOXACIN  >=4 RESISTANT Resistant     IMIPENEM 2 SENSITIVE Sensitive     CEFTAZIDIME/AVIBACTAM 4 SENSITIVE Sensitive     CEFTOLOZANE/TAZOBACTAM 1 SENSITIVE Sensitive     TOBRAMYCIN <=1 SENSITIVE Sensitive     CEFTAZIDIME 4 SENSITIVE Sensitive     * RARE PSEUDOMONAS AERUGINOSA  Fungus Culture Result     Status: None   Collection Time: 06/05/24  7:10 PM  Result Value Ref Range Status   Result 1 Comment  Final    Comment: (NOTE) KOH/Calcofluor preparation:  no fungus observed. Performed At: [facility] [address] [place], [place] [id number] Jennette Shorter MD Ey:[id number]   Urine Culture (for pregnant, neutropenic or urologic patients or patients with an indwelling urinary catheter)     Status: Abnormal (Preliminary result)   Collection Time: 06/11/24  9:50 AM   Specimen: Urine, Clean Catch  Result Value Ref Range Status   Specimen Description   Final    URINE, CLEAN CATCH Performed at [facility], [address] [place], [place] [address]    Special Requests   Final    NONE Performed at [facility], [address], [place], [place] [address]    Culture (A)  Final    >=100,000 COLONIES/mL GRAM NEGATIVE RODS CULTURE REINCUBATED FOR BETTER GROWTH Performed at [facility], [address], [place], [place] [address]    Report Status PENDING  Incomplete  C Difficile Quick Screen (NO PCR Reflex)      Status: Abnormal   Collection Time: 06/12/24  9:52 PM   Specimen: STOOL  Result Value Ref Range Status   C Diff antigen POSITIVE (A) NEGATIVE Final   C Diff toxin NEGATIVE NEGATIVE Final   C Diff interpretation   Final    Results are indeterminate. Please contact the provider listed for your campus for C diff questions in AMION.    Comment: Performed at [facility], [address], [place], [place] [address]      Radiology Studies: CT CHEST ABDOMEN PELVIS W CONTRAST Result Date: 06/11/2024 CLINICAL DATA:  Shortness of breath.  Abdominal pain. EXAM: CT CHEST, ABDOMEN, AND PELVIS WITH CONTRAST TECHNIQUE: Multidetector CT imaging of the chest, abdomen and pelvis was performed following the standard protocol during bolus administration of intravenous contrast. RADIATION DOSE REDUCTION: This exam was performed according to the departmental dose-optimization program which includes automated exposure control, adjustment of the mA and/or kV according to patient size and/or use of iterative reconstruction technique. CONTRAST:  OMNIPAQUE  IOHEXOL  300 MG/ML  SOLN COMPARISON:  Chest radiograph dated 03/01/2024. CT abdomen pelvis dated 02/12/2024. FINDINGS: CT CHEST FINDINGS Cardiovascular: There is no cardiomegaly or pericardial effusion. The thoracic aorta is unremarkable. The origins of the great vessels of the aortic arch and the central pulmonary arteries are patent. Mediastinum/Nodes: No hilar or mediastinal adenopathy. The esophagus is grossly unremarkable. No mediastinal fluid collection. Lungs/Pleura: No focal consolidation, pleural effusion or pneumothorax. The central airways are patent. Musculoskeletal: No acute osseous pathology. CT ABDOMEN PELVIS FINDINGS No intra-abdominal free air or free fluid. Hepatobiliary: The liver is unremarkable. No biliary ductal dilatation. The gallbladder is unremarkable. Pancreas: Unremarkable. No pancreatic ductal dilatation or surrounding  inflammatory changes. Spleen: Normal in size  without focal abnormality. Adrenals/Urinary Tract: The adrenal glands unremarkable. Small nonobstructing right renal calculi measure up to 5 mm in the interpolar right kidney. No hydronephrosis. The left kidney is unremarkable. The visualized ureters and urinary bladder appear unremarkable. Stomach/Bowel: There is no bowel obstruction or active inflammation. The appendix is normal. Vascular/Lymphatic: The abdominal aorta and IVC unremarkable. No portal venous gas. There is no adenopathy. Reproductive: Inflammatory changes and thickening of the labia and urethral orifice with diffuse stranding and edema in the subcutaneous soft tissues of the anterior pubic wall and perineum. Correlation with clinical exam recommended. No drainable fluid collection or abscess. There is apparent ulceration of the skin with small pockets of soft tissue air in the anterior pubic/labial region. Other: There is ulceration of the subcutaneous soft tissues across the anterior pelvic wall. No drainable fluid collection. Musculoskeletal: No acute osseous pathology. IMPRESSION: 1. No acute intrathoracic pathology. 2. Inflammatory changes and thickening of the labia and urethral orifice with diffuse stranding and edema in the subcutaneous soft tissues of the anterior pubic wall and perineum. 3. Ulceration of the skin of the anterior pubic and labial area. No drainable fluid collection or abscess. 4. Ulceration of the subcutaneous soft tissues across the anterior pelvic wall. No fluid collection. 5. No bowel obstruction. Normal appendix. 6. Small nonobstructing right renal calculi. No hydronephrosis. Electronically Signed   By: Vanetta Chou M.D.   On: 06/11/2024 17:51      LOS: 7 days   Doyne Micke Verdene  [facility] Pager on [email or web address]  06/13/2024, 10:49 AM   "

## 2024-06-14 ENCOUNTER — Other Ambulatory Visit (HOSPITAL_COMMUNITY): Payer: Self-pay

## 2024-06-14 DIAGNOSIS — A0811 Acute gastroenteropathy due to Norwalk agent: Secondary | ICD-10-CM

## 2024-06-14 DIAGNOSIS — B3781 Candidal esophagitis: Secondary | ICD-10-CM | POA: Diagnosis not present

## 2024-06-14 LAB — HIV-1 RNA QUANT-NO REFLEX-BLD
HIV 1 RNA Quant: 30 {copies}/mL
LOG10 HIV-1 RNA: 1.477 {Log_copies}/mL

## 2024-06-14 MED ORDER — NYSTATIN 100000 UNIT/ML MT SUSP
5.0000 mL | Freq: Four times a day (QID) | OROMUCOSAL | 0 refills | Status: DC
  Filled 2024-06-14: qty 473, 24d supply, fill #0

## 2024-06-14 MED ORDER — NYSTATIN 100000 UNIT/ML MT SUSP
5.0000 mL | Freq: Four times a day (QID) | OROMUCOSAL | Status: DC
  Administered 2024-06-14 – 2024-06-17 (×9): 500000 [IU] via OROMUCOSAL
  Filled 2024-06-14 (×11): qty 5

## 2024-06-14 MED ORDER — FOLIC ACID 1 MG PO TABS
1.0000 mg | ORAL_TABLET | Freq: Every day | ORAL | 0 refills | Status: AC
  Filled 2024-06-14: qty 90, 90d supply, fill #0

## 2024-06-14 MED ORDER — FLUCONAZOLE 200 MG PO TABS
200.0000 mg | ORAL_TABLET | Freq: Every day | ORAL | 0 refills | Status: AC
  Filled 2024-06-14: qty 18, 18d supply, fill #0

## 2024-06-14 MED ORDER — LORAZEPAM 0.5 MG PO TABS
0.5000 mg | ORAL_TABLET | Freq: Four times a day (QID) | ORAL | Status: DC | PRN
  Administered 2024-06-14 – 2024-06-20 (×7): 0.5 mg via ORAL
  Filled 2024-06-14 (×5): qty 1

## 2024-06-14 NOTE — Plan of Care (Signed)
  Problem: Clinical Measurements: Goal: Ability to maintain clinical measurements within normal limits will improve Outcome: Progressing Goal: Will remain free from infection Outcome: Progressing Goal: Diagnostic test results will improve Outcome: Progressing   Problem: Activity: Goal: Risk for activity intolerance will decrease Outcome: Progressing   Problem: Nutrition: Goal: Adequate nutrition will be maintained Outcome: Progressing   Problem: Coping: Goal: Level of anxiety will decrease Outcome: Progressing   

## 2024-06-14 NOTE — Progress Notes (Signed)
 "  [facility] PROGRESS NOTE   ARI ENGELBRECHT FMW:[id number] DOB: Nov 01, 1980 DOA: 06/05/2024  PCP: Melvenia Corean SAILOR, NP  Brief History: 43 y.o. 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  She was discharged home after that surgical procedure.  She presented to the general surgeons office on 12/15 with complaints of increased pain in her wound and persistent drainage from the wound.  She was directly admitted to the hospital.  There was concern for wound infection.  She underwent incision and drainage of the abdominal wall wound on 12/15.  Based on microbiology it looks like she grew out MRSA and Pseudomonas from the wound cultures.  Because of adequate drainage and clean wound postoperatively she was not continued on antibiotics.  She was given vancomycin  for surgical prophylaxis.  Plan was for wound care education and then subsequently discharge home.  Hospitalist service called for consultation since patient started complaining of nausea vomiting dizziness and loose stools.  Patient also mention symptoms suggestive of dysphagia.    Consultants: Gastroenterology.  Infectious disease  Procedures: EGD   Subjective/Interval History: Patient mentions that she feels better overall.  Diarrhea is improving.  Complaining of pain in the mouth area.  Tolerating her diet.    Assessment/Plan:  Acute diarrhea/norovirus infection Abdominal pain appears to be mostly secondary to her abdominal wound and recent surgery.   CT scan did not raise concern for inflammatory changes in the perineum, anterior pubic wall, labia and urethral orifice.  However no fluid collection was noted.  Ulceration of the skin of the anterior pubic and labial area was also noted.  According to general surgery these are expected findings considering her recent surgical interventions. C. difficile antigen was positive but toxin negative.    Norovirus is noted to be positive in the GI pathogen panel.  This would explain her diarrheal illness.  Appears to be improving with supportive treatment.  No further workup or change in management plan at this time.  Continue oral hydration.  Dysphagia/esophageal candidiasis Etiology unclear.  She does have history of HIV.  Differential diagnosis is broad.  She has never had endoscopy previously.  There is some whitish plaques in her mouth raising concern for candidiasis.  Patient underwent EGD which revealed esophageal candidiasis.  She will need Diflucan  for 3 weeks which has been ordered by infectious disease.   Abdominal wound/squamous cell carcinoma abdominal wall She is status post excision of abdominal wound which was positive for squamous cell carcinoma.  Subsequently developed wound infection and underwent I&D.  Wound cultures grew MRSA and Pseudomonas however since most of the infection was surgically removed it was felt by surgery team that antibiotics were not necessary.  She has not been on antibiotics.   CT scan shows inflammatory changes without any fluid collection.  She is afebrile.  She is leukopenic. Defer further management to general surgery.  Abnormal UA Abnormal UA noted.  Urine culture growing Citrobacter and Klebsiella.  Asymptomatic bacteriuria per infectious disease.  No need for antibiotics.   HIV/AIDS She is on Biktarvy  and atovaquone .  Seen by infectious disease. CD4 count is less than 35.   Dizziness/borderline low blood pressure Likely due to hypovolemia.  Improved with IV fluids.  Continue oral hydration.  Leukopenia/normocytic anemia/folic acid  deficiency Most likely due to HIV/chronic disease.  No evidence of overt bleeding.  Recently checked iron panel in September reviewed.  Ferritin was 92, iron 63, TIBC 259,  percent saturation 24. Folic acid  level noted to be low and will be supplemented. Drop in hemoglobin is dilutional.  No evidence of overt blood  loss.    Hypomagnesemia/hyponatremia Magnesium  has been supplemented.  Sodium level has improved with IV hydration.  Patient is stable.  Symptoms have improved with supportive care.  She will need Diflucan  for 3 weeks as discussed above.  No further medical workup at this time.   Stable for discharge from medicine standpoint.  Defers discharge to general surgery. [facility] will sign off.    DVT Prophylaxis: Lovenox  Code Status: Full code Family Communication: Discussed with patient Disposition Plan: To be determined.  Needs to be mobilized.     Medications: Scheduled:  acetaminophen   1,000 mg Oral Q6H   atovaquone   1,500 mg Oral Daily   bictegravir-emtricitabine -tenofovir  AF  1 tablet Oral Daily   enoxaparin  (LOVENOX ) injection  40 mg Subcutaneous Q24H   fluconazole   200 mg Oral Daily   folic acid   1 mg Oral Daily   gabapentin   100 mg Oral TID   lamoTRIgine   25 mg Oral q morning   methocarbamol   500 mg Oral QID   nystatin   5 mL Mouth/Throat QID   pantoprazole   40 mg Oral Daily   pregabalin   25 mg Oral BID   Continuous:   PRN:acetaminophen , acetaminophen , albuterol , alum & mag hydroxide-simeth, diphenhydrAMINE  **OR** diphenhydrAMINE , HYDROmorphone  (DILAUDID ) injection, loperamide , LORazepam , melatonin, menthol , methocarbamol  (ROBAXIN ) injection, naphazoline-glycerin , ondansetron  (ZOFRAN ) IV, ondansetron  **OR** [DISCONTINUED] ondansetron  (ZOFRAN ) IV, oxyCODONE , phenol, prochlorperazine , sodium chloride   Antibiotics: Anti-infectives (From admission, onward)    Start     Dose/Rate Route Frequency Ordered Stop   06/12/24 1330  fluconazole  (DIFLUCAN ) tablet 200 mg        200 mg Oral Daily 06/12/24 1230 07/03/24 0959   06/06/24 1000  bictegravir-emtricitabine -tenofovir  AF (BIKTARVY ) 50-200-25 MG per tablet 1 tablet        1 tablet Oral Daily 06/05/24 1927     06/06/24 1000  atovaquone  (MEPRON ) 750 MG/5ML suspension 1,500 mg        1,500 mg Oral Daily 06/05/24 1927     06/05/24 1645   vancomycin  (VANCOCIN ) IVPB 1000 mg/200 mL premix        1,000 mg 200 mL/hr over 60 Minutes Intravenous On call to O.R. 06/05/24 1628 06/05/24 2040   06/05/24 1631  vancomycin  (VANCOCIN ) 1-5 GM/200ML-% IVPB       Note to Pharmacy: Carrol Bascom ORN: cabinet override      06/05/24 1631 06/06/24 0444       Objective:  Vital Signs  Vitals:   06/13/24 1250 06/13/24 1258 06/13/24 2237 06/14/24 0553  BP: (!) 103/58 114/60 109/83 105/76  Pulse: 82 86 86 97  Resp: (!) 24 (!) 23 16 18   Temp:   98.1 F (36.7 C) 98.4 F (36.9 C)  TempSrc:   Oral Oral  SpO2: 100% 100% 100% 100%  Weight:      Height:        Intake/Output Summary (Last 24 hours) at 06/14/2024 1016 Last data filed at 06/14/2024 0555 Gross per 24 hour  Intake 866.23 ml  Output 200 ml  Net 666.23 ml   Filed Weights   06/05/24 1625  Weight: 53 kg    General appearance: Awake alert.  In no distress Resp: Clear to auscultation bilaterally.  Normal effort Cardio: S1-S2 is normal regular.  No S3-S4.  No rubs murmurs or bruit   Lab Results:  Data Reviewed: I have personally reviewed following  labs and reports of the imaging studies  CBC: Recent Labs  Lab 06/09/24 0327 06/11/24 1529 06/12/24 0550 06/13/24 0333  WBC 3.2* 2.4* 1.8* 1.7*  NEUTROABS  --  1.4*  --   --   HGB 9.4* 9.5* 8.2* 7.7*  HCT 30.9* 30.6* 26.6* 25.1*  MCV 82.0 80.3 81.1 81.5  PLT 519* 476* 403* 378    Basic Metabolic Panel: Recent Labs  Lab 06/09/24 0327 06/11/24 1529 06/12/24 0550 06/13/24 0333  NA 133* 130* 134* 134*  K 3.4* 3.6 3.8 3.7  CL 100 97* 104 105  CO2 23 20* 20* 20*  GLUCOSE 102* 104* 102* 93  BUN 17 11 12 11   CREATININE 1.06* 0.89 0.84 0.78  CALCIUM  8.6* 8.7* 8.4* 8.3*  MG  --  1.4* 2.4 1.7  PHOS  --  3.2  --   --     GFR: Estimated Creatinine Clearance: 69.8 mL/min (by C-G formula based on SCr of 0.78 mg/dL).  Liver Function Tests: Recent Labs  Lab 06/11/24 1529  AST 17  ALT 7  ALKPHOS 143*  BILITOT 0.4   PROT 8.9*  ALBUMIN 3.0*    Recent Labs  Lab 06/11/24 1529  LIPASE 22   Coagulation Profile: Recent Labs  Lab 06/11/24 1529  INR 1.1    Recent Results (from the past 240 hours)  Fungus Culture With Stain     Status: None (Preliminary result)   Collection Time: 06/05/24  7:10 PM   Specimen: Abdomen; Abscess  Result Value Ref Range Status   Fungus Stain Final report  Final    Comment: (NOTE) Performed At: [facility] [address] [place], [place] [id number] Jennette Shorter MD Ey:[id number]    Fungus (Mycology) Culture PENDING  Incomplete   Fungal Source WOUND  Final    Comment: ABDOMINAL WALL Performed at [facility], [address], [place], [place] [address]   Aerobic/Anaerobic Culture w Gram Stain (surgical/deep wound)     Status: None   Collection Time: 06/05/24  7:10 PM   Specimen: Abdomen; Abscess  Result Value Ref Range Status   Specimen Description   Final    WOUND ABDOMINAL WALL Performed at [facility], [address] [place], [place] [address]    Special Requests   Final    NONE Performed at [facility], [address], [place], [place] [address]    Gram Stain   Final    FEW WBC PRESENT, PREDOMINANTLY PMN FEW GRAM POSITIVE COCCI FEW GRAM NEGATIVE RODS    Culture   Final    RARE METHICILLIN RESISTANT STAPHYLOCOCCUS AUREUS RARE PSEUDOMONAS AERUGINOSA FEW GROUP B STREP(S.AGALACTIAE)ISOLATED TESTING AGAINST S. AGALACTIAE NOT ROUTINELY PERFORMED DUE TO PREDICTABILITY OF AMP/PEN/VAN SUSCEPTIBILITY. MODERATE BACTEROIDES FRAGILIS BETA LACTAMASE POSITIVE Performed at [facility], [address], [place], [place] [address]    Report Status 06/09/2024 FINAL  Final   Organism ID, Bacteria METHICILLIN RESISTANT STAPHYLOCOCCUS AUREUS  Final   Organism ID, Bacteria PSEUDOMONAS AERUGINOSA  Final      Susceptibility   Methicillin resistant staphylococcus aureus - MIC*    CIPROFLOXACIN   <=0.5 SENSITIVE Sensitive     ERYTHROMYCIN >=8 RESISTANT Resistant     GENTAMICIN <=0.5 SENSITIVE Sensitive     OXACILLIN >=4 RESISTANT Resistant     TETRACYCLINE >=16 RESISTANT Resistant     VANCOMYCIN  <=0.5 SENSITIVE Sensitive     TRIMETH /SULFA  <=10 SENSITIVE Sensitive     CLINDAMYCIN  <=0.25 SENSITIVE Sensitive  RIFAMPIN <=0.5 SENSITIVE Sensitive     Inducible Clindamycin  NEGATIVE Sensitive     LINEZOLID 2 SENSITIVE Sensitive     * RARE METHICILLIN RESISTANT STAPHYLOCOCCUS AUREUS   Pseudomonas aeruginosa - MIC*    MEROPENEM 4 INTERMEDIATE Intermediate     CIPROFLOXACIN  >=4 RESISTANT Resistant     IMIPENEM 2 SENSITIVE Sensitive     CEFTAZIDIME/AVIBACTAM 4 SENSITIVE Sensitive     CEFTOLOZANE/TAZOBACTAM 1 SENSITIVE Sensitive     TOBRAMYCIN <=1 SENSITIVE Sensitive     CEFTAZIDIME 4 SENSITIVE Sensitive     * RARE PSEUDOMONAS AERUGINOSA  Fungus Culture Result     Status: None   Collection Time: 06/05/24  7:10 PM  Result Value Ref Range Status   Result 1 Comment  Final    Comment: (NOTE) KOH/Calcofluor preparation:  no fungus observed. Performed At: [facility] [address] [place], [place] [id number] Jennette Shorter MD Ey:[id number]   Urine Culture (for pregnant, neutropenic or urologic patients or patients with an indwelling urinary catheter)     Status: Abnormal (Preliminary result)   Collection Time: 06/11/24  9:50 AM   Specimen: Urine, Clean Catch  Result Value Ref Range Status   Specimen Description   Final    URINE, CLEAN CATCH Performed at [facility], [address] [place], [place] [address]    Special Requests   Final    NONE Performed at [facility], [address], [place], [place] [address]    Culture (A)  Final    >=100,000 COLONIES/mL CITROBACTER FREUNDII 60,000 COLONIES/mL KLEBSIELLA PNEUMONIAE SUSCEPTIBILITIES TO FOLLOW Performed at [facility], [address], [place], [place] [address]     Report Status PENDING  Incomplete   Organism ID, Bacteria CITROBACTER FREUNDII (A)  Final      Susceptibility   Citrobacter freundii - MIC*    CEFEPIME  <=0.12 SENSITIVE Sensitive     ERTAPENEM <=0.12 SENSITIVE Sensitive     CEFTRIAXONE  0.5 SENSITIVE Sensitive     CIPROFLOXACIN  <=0.06 SENSITIVE Sensitive     GENTAMICIN <=1 SENSITIVE Sensitive     NITROFURANTOIN  <=16 SENSITIVE Sensitive     TRIMETH /SULFA  <=20 SENSITIVE Sensitive     PIP/TAZO Value in next row Sensitive      <=4 SENSITIVEThis is a modified FDA-approved test that has been validated and its performance characteristics determined by the reporting laboratory.  This laboratory is certified under the Clinical Laboratory Improvement Amendments CLIA as qualified to perform high complexity clinical laboratory testing.    MEROPENEM Value in next row Sensitive      <=4 SENSITIVEThis is a modified FDA-approved test that has been validated and its performance characteristics determined by the reporting laboratory.  This laboratory is certified under the Clinical Laboratory Improvement Amendments CLIA as qualified to perform high complexity clinical laboratory testing.    * >=100,000 COLONIES/mL CITROBACTER FREUNDII  Gastrointestinal Panel by PCR , Stool     Status: Abnormal   Collection Time: 06/12/24  9:52 PM   Specimen: STOOL  Result Value Ref Range Status   Campylobacter species NOT DETECTED NOT DETECTED Final   Plesimonas shigelloides NOT DETECTED NOT DETECTED Final   Salmonella species NOT DETECTED NOT DETECTED Final   Yersinia enterocolitica NOT DETECTED NOT DETECTED Final   Vibrio species NOT DETECTED NOT DETECTED Final   Vibrio cholerae NOT DETECTED NOT DETECTED Final   Enteroaggregative E coli (EAEC) NOT DETECTED NOT DETECTED Final   Enteropathogenic E coli (EPEC) NOT DETECTED NOT DETECTED Final  Enterotoxigenic E coli (ETEC) NOT DETECTED NOT DETECTED Final   Shiga like toxin producing E coli (STEC) NOT DETECTED NOT DETECTED  Final   Shigella/Enteroinvasive E coli (EIEC) NOT DETECTED NOT DETECTED Final   Cryptosporidium NOT DETECTED NOT DETECTED Final   Cyclospora cayetanensis NOT DETECTED NOT DETECTED Final   Entamoeba histolytica NOT DETECTED NOT DETECTED Final   Giardia lamblia NOT DETECTED NOT DETECTED Final   Adenovirus F40/41 NOT DETECTED NOT DETECTED Final   Astrovirus NOT DETECTED NOT DETECTED Final   Norovirus GI/GII DETECTED (A) NOT DETECTED Final    Comment: RESULT CALLED TO, READ BACK BY AND VERIFIED WITH: LAMAR NINE RN @2114  06/13/24 ASW    Rotavirus A NOT DETECTED NOT DETECTED Final   Sapovirus (I, II, IV, and V) DETECTED (A) NOT DETECTED Final    Comment: Performed at [facility], [address] [place], [place] [address]  C Difficile Quick Screen (NO PCR Reflex)     Status: Abnormal   Collection Time: 06/12/24  9:52 PM   Specimen: STOOL  Result Value Ref Range Status   C Diff antigen POSITIVE (A) NEGATIVE Final   C Diff toxin NEGATIVE NEGATIVE Final   C Diff interpretation   Final    Results are indeterminate. Please contact the provider listed for your campus for C diff questions in AMION.    Comment: Performed at [facility], [address], [place], [place] [address]      Radiology Studies: No results found.     LOS: 8 days   Jessah Danser [employer] on [email or web address]  06/14/2024, 10:16 AM   "

## 2024-06-14 NOTE — Progress Notes (Signed)
 1 Day Post-Op   Subjective/Chief Complaint: Complains of sore/swollen tongue No nausea Denies dysphagia    Objective: Vital signs in last 24 hours: Temp:  [97.8 F (36.6 C)-98.4 F (36.9 C)] 98.4 F (36.9 C) (12/24 0553) Pulse Rate:  [81-97] 97 (12/24 0553) Resp:  [16-28] 18 (12/24 0553) BP: (98-116)/(55-83) 105/76 (12/24 0553) SpO2:  [100 %] 100 % (12/24 0553) Last BM Date : 06/13/24  Intake/Output from previous day: 12/23 0701 - 12/24 0700 In: 866.2 [P.O.:480; I.V.:300; IV Piggyback:86.2] Out: 200 [Stool:200] Intake/Output this shift: No intake/output data recorded.  General appearance: alert and cooperative Resp: clear to auscultation bilaterally Cardio: regular rate and rhythm GI: soft, nontender. Not distended. Wound clean Tongue doesn't appear terribly swollen to me Lab Results:  Recent Labs    06/12/24 0550 06/13/24 0333  WBC 1.8* 1.7*  HGB 8.2* 7.7*  HCT 26.6* 25.1*  PLT 403* 378   BMET Recent Labs    06/12/24 0550 06/13/24 0333  NA 134* 134*  K 3.8 3.7  CL 104 105  CO2 20* 20*  GLUCOSE 102* 93  BUN 12 11  CREATININE 0.84 0.78  CALCIUM  8.4* 8.3*   PT/INR Recent Labs    06/11/24 1529  LABPROT 14.4  INR 1.1   ABG No results for input(s): PHART, HCO3 in the last 72 hours.  Invalid input(s): PCO2, PO2  Studies/Results: No results found.   Anti-infectives: Anti-infectives (From admission, onward)    Start     Dose/Rate Route Frequency Ordered Stop   06/12/24 1330  fluconazole  (DIFLUCAN ) tablet 200 mg        200 mg Oral Daily 06/12/24 1230 07/03/24 0959   06/06/24 1000  bictegravir-emtricitabine -tenofovir  AF (BIKTARVY ) 50-200-25 MG per tablet 1 tablet        1 tablet Oral Daily 06/05/24 1927     06/06/24 1000  atovaquone  (MEPRON ) 750 MG/5ML suspension 1,500 mg        1,500 mg Oral Daily 06/05/24 1927     06/05/24 1645  vancomycin  (VANCOCIN ) IVPB 1000 mg/200 mL premix        1,000 mg 200 mL/hr over 60 Minutes Intravenous On  call to O.R. 06/05/24 1628 06/05/24 2040   06/05/24 1631  vancomycin  (VANCOCIN ) 1-5 GM/200ML-% IVPB       Note to Pharmacy: [name] W: cabinet override      06/05/24 1631 06/06/24 0444       Assessment/Plan: s/p Procedures: EGD (ESOPHAGOGASTRODUODENOSCOPY) (N/A) POD 9, s/p drainage of wound infection by Dr. 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 Rogelio (GYN)  reg diet. Start fulls today. No sbo on CT several days ago Continue wet to dry dressing changes Off abx. Wound clean and wbc not elevated EGD 12/23- probable candidiasis  AIDS per ID Will follow  LOS: 8 days    Camellia Blush 06/14/2024

## 2024-06-14 NOTE — Anesthesia Postprocedure Evaluation (Signed)
"   Anesthesia Post Note  Patient: OPAL DINNING  Procedure(s) Performed: EGD (ESOPHAGOGASTRODUODENOSCOPY)     Patient location during evaluation: Endoscopy Anesthesia Type: MAC Level of consciousness: awake and alert Pain management: pain level controlled Vital Signs Assessment: post-procedure vital signs reviewed and stable Respiratory status: spontaneous breathing, nonlabored ventilation, respiratory function stable and patient connected to nasal cannula oxygen Cardiovascular status: stable and blood pressure returned to baseline Postop Assessment: no apparent nausea or vomiting Anesthetic complications: no   There were no known notable events for this encounter.  Last Vitals:  Vitals:   06/14/24 0553 06/14/24 1329  BP: 105/76 119/86  Pulse: 97 (!) 101  Resp: 18 17  Temp: 36.9 C 36.8 C  SpO2: 100% 99%    Last Pain:  Vitals:   06/14/24 1502  TempSrc:   PainSc: 5    Pain Goal: Patients Stated Pain Goal: 3 (06/14/24 1015)                 Blayn Whetsell L Damarius Karnes      "

## 2024-06-14 NOTE — Plan of Care (Signed)
" °  Problem: Health Behavior/Discharge Planning: Goal: Ability to manage health-related needs will improve Outcome: Adequate for Discharge   Problem: Clinical Measurements: Goal: Ability to maintain clinical measurements within normal limits will improve Outcome: Progressing Goal: Will remain free from infection Outcome: Progressing Goal: Diagnostic test results will improve Outcome: Progressing Goal: Respiratory complications will improve Outcome: Progressing   Problem: Activity: Goal: Risk for activity intolerance will decrease Outcome: Adequate for Discharge   Problem: Nutrition: Goal: Adequate nutrition will be maintained Outcome: Progressing   Problem: Coping: Goal: Level of anxiety will decrease Outcome: Progressing   Problem: Pain Managment: Goal: General experience of comfort will improve and/or be controlled Outcome: Progressing   Problem: Safety: Goal: Ability to remain free from injury will improve Outcome: Progressing   Problem: Skin Integrity: Goal: Risk for impaired skin integrity will decrease Outcome: Progressing   "

## 2024-06-15 ENCOUNTER — Encounter (HOSPITAL_COMMUNITY): Payer: Self-pay | Admitting: Gastroenterology

## 2024-06-15 LAB — URINE CULTURE: Culture: >=100000 — AB

## 2024-06-15 NOTE — Plan of Care (Signed)
  Problem: Pain Managment: Goal: General experience of comfort will improve and/or be controlled Outcome: Progressing   Problem: Pain Managment: Goal: General experience of comfort will improve and/or be controlled Outcome: Progressing

## 2024-06-15 NOTE — Progress Notes (Addendum)
 2 Days Post-Op   Subjective/Chief Complaint: Tongue better No nausea Denies dysphagia Appetite waxing and waning - but looks like entire breakfast eaten Still with loose stools    Objective: Vital signs in last 24 hours: Temp:  [98.2 F (36.8 C)-98.7 F (37.1 C)] 98.7 F (37.1 C) (12/25 0528) Pulse Rate:  [88-101] 88 (12/25 0528) Resp:  [17-18] 18 (12/25 0528) BP: (104-119)/(72-86) 104/72 (12/25 0528) SpO2:  [99 %-100 %] 99 % (12/25 0528) Last BM Date : 06/13/24  Intake/Output from previous day: No intake/output data recorded. Intake/Output this shift: No intake/output data recorded.  Pt in restroom   Lab Results:  Recent Labs    06/13/24 0333  WBC 1.7*  HGB 7.7*  HCT 25.1*  PLT 378   BMET Recent Labs    06/13/24 0333  NA 134*  K 3.7  CL 105  CO2 20*  GLUCOSE 93  BUN 11  CREATININE 0.78  CALCIUM  8.3*   PT/INR No results for input(s): LABPROT, INR in the last 72 hours.  ABG No results for input(s): PHART, HCO3 in the last 72 hours.  Invalid input(s): PCO2, PO2  Studies/Results: No results found.   Anti-infectives: Anti-infectives (From admission, onward)    Start     Dose/Rate Route Frequency Ordered Stop   06/15/24 0000  fluconazole  (DIFLUCAN ) 200 MG tablet        200 mg Oral Daily 06/14/24 1023 07/03/24 2359   06/12/24 1330  fluconazole  (DIFLUCAN ) tablet 200 mg        200 mg Oral Daily 06/12/24 1230 07/03/24 0959   06/06/24 1000  bictegravir-emtricitabine -tenofovir  AF (BIKTARVY ) 50-200-25 MG per tablet 1 tablet        1 tablet Oral Daily 06/05/24 1927     06/06/24 1000  atovaquone  (MEPRON ) 750 MG/5ML suspension 1,500 mg        1,500 mg Oral Daily 06/05/24 1927     06/05/24 1645  vancomycin  (VANCOCIN ) IVPB 1000 mg/200 mL premix        1,000 mg 200 mL/hr over 60 Minutes Intravenous On call to O.R. 06/05/24 1628 06/05/24 2040   06/05/24 1631  vancomycin  (VANCOCIN ) 1-5 GM/200ML-% IVPB       Note to Pharmacy: [name] W:  cabinet override      06/05/24 1631 06/06/24 0444       Assessment/Plan: s/p Procedures: EGD (ESOPHAGOGASTRODUODENOSCOPY) (N/A) POD 10, s/p drainage of wound infection by Dr. 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 Rogelio (GYN)  reg diet.  No sbo on CT several days ago Continue wet to dry dressing changes Off abx. Wound clean and wbc not elevated EGD 12/23- probable candidiasis, needs 3 weeks diflucan    Norovirus - slowly improving  AIDS per ID  Plan dc Friday  Will plan [facility] consult to see if pt needs med assistance; pt staying with family and so not sure if they can help with dressing/wound care, or if pt candidate for [facility] D/w with pt   LOS: 9 days    Camellia Blush 06/15/2024

## 2024-06-15 NOTE — Progress Notes (Signed)
 Provider notified at 1440 of change in patient's status due to elevated temperature and heart rate. Orders placed by the provider on patient.  Provider also made aware of patient's refusal of today's dose of Lovenox . Provider verbalized his understanding.  Patient given tylenol  for fever.  Upon reassessment, medication noted to be effective in lower temperature.  [name] requested for dressing changed to be postponed until this afternoon. Dressing change completed per order and patient tolerated the procedure.

## 2024-06-15 NOTE — TOC Progression Note (Signed)
 Transition of Care [facility]) - Progression Note    Patient Details  Name: Beverly [name] MRN: [id number] Date of Birth: 1980/10/16  Transition of Care [facility]) CM/SW Contact  Lorraine LILLETTE Fenton, LCSW Phone Number: 06/15/2024, 2:14 PM  Clinical Narrative:     CSW reviewed new consult, pt may need to DC with wound Vac- will need skilled RN if so. ICM will follow to determine if HH wound vac an option.   Expected Discharge Plan: Home/Self Care Barriers to Discharge: No Home Care Agency will accept this patient, Inadequate or no insurance, Continued Medical Work up               Expected Discharge Plan and Services In-house Referral: Clinical Social Work     Living arrangements for the past 2 months: Apartment                                       Social Drivers of Health (SDOH) Interventions SDOH Screenings   Food Insecurity: Food Insecurity Present (06/05/2024)  Housing: High Risk (06/05/2024)  Transportation Needs: No Transportation Needs (06/05/2024)  Utilities: At Risk (06/05/2024)  Depression (PHQ2-9): Medium Risk (04/06/2024)  Tobacco Use: High Risk (06/13/2024)    Readmission Risk Interventions    06/07/2024    2:41 PM 02/13/2024   10:23 AM  Readmission Risk Prevention Plan  Transportation Screening Complete Complete  PCP or Specialist Appt within 5-7 Days  Not Complete  Not Complete comments  N/A, pt has Medicaid  PCP or Specialist Appt within 3-5 Days Complete   Home Care Screening  Complete  Medication Review (RN CM)  Complete  HRI or Home Care Consult Complete   Social Work Consult for Recovery Care Planning/Counseling Complete   Palliative Care Screening Not Applicable   Medication Review Oceanographer) Complete

## 2024-06-16 ENCOUNTER — Other Ambulatory Visit (HOSPITAL_COMMUNITY): Payer: Self-pay

## 2024-06-16 ENCOUNTER — Inpatient Hospital Stay (HOSPITAL_COMMUNITY)

## 2024-06-16 DIAGNOSIS — M7989 Other specified soft tissue disorders: Secondary | ICD-10-CM | POA: Diagnosis not present

## 2024-06-16 DIAGNOSIS — T8149XA Infection following a procedure, other surgical site, initial encounter: Secondary | ICD-10-CM | POA: Diagnosis not present

## 2024-06-16 DIAGNOSIS — D72819 Decreased white blood cell count, unspecified: Secondary | ICD-10-CM | POA: Diagnosis not present

## 2024-06-16 DIAGNOSIS — A09 Infectious gastroenteritis and colitis, unspecified: Secondary | ICD-10-CM

## 2024-06-16 DIAGNOSIS — R509 Fever, unspecified: Secondary | ICD-10-CM

## 2024-06-16 DIAGNOSIS — B20 Human immunodeficiency virus [HIV] disease: Secondary | ICD-10-CM

## 2024-06-16 LAB — COMPREHENSIVE METABOLIC PANEL WITH GFR
ALT: 6 U/L (ref 0–44)
AST: 15 U/L (ref 15–41)
Albumin: 2.8 g/dL — ABNORMAL LOW (ref 3.5–5.0)
Alkaline Phosphatase: 151 U/L — ABNORMAL HIGH (ref 38–126)
Anion gap: 11 (ref 5–15)
BUN: 8 mg/dL (ref 6–20)
CO2: 23 mmol/L (ref 22–32)
Calcium: 8.7 mg/dL — ABNORMAL LOW (ref 8.9–10.3)
Chloride: 98 mmol/L (ref 98–111)
Creatinine, Ser: 0.89 mg/dL (ref 0.44–1.00)
GFR, Estimated: >60 mL/min
Glucose, Bld: 110 mg/dL — ABNORMAL HIGH (ref 70–99)
Potassium: 3.8 mmol/L (ref 3.5–5.1)
Sodium: 132 mmol/L — ABNORMAL LOW (ref 135–145)
Total Bilirubin: 0.6 mg/dL (ref 0.0–1.2)
Total Protein: 8.1 g/dL (ref 6.5–8.1)

## 2024-06-16 LAB — CBC
HCT: 26 % — ABNORMAL LOW (ref 36.0–46.0)
Hemoglobin: 7.9 g/dL — ABNORMAL LOW (ref 12.0–15.0)
MCH: 24.7 pg — ABNORMAL LOW (ref 26.0–34.0)
MCHC: 30.4 g/dL (ref 30.0–36.0)
MCV: 81.3 fL (ref 80.0–100.0)
Platelets: 341 K/uL (ref 150–400)
RBC: 3.2 MIL/uL — ABNORMAL LOW (ref 3.87–5.11)
RDW: 17.7 % — ABNORMAL HIGH (ref 11.5–15.5)
WBC: 1.5 K/uL — ABNORMAL LOW (ref 4.0–10.5)
nRBC: 0 % (ref 0.0–0.2)

## 2024-06-16 LAB — SURGICAL PATHOLOGY

## 2024-06-16 MED ORDER — SODIUM CHLORIDE 0.9 % IV BOLUS
500.0000 mL | Freq: Once | INTRAVENOUS | Status: AC
  Administered 2024-06-16: 500 mL via INTRAVENOUS

## 2024-06-16 MED ORDER — SODIUM CHLORIDE 0.9 % IV SOLN: INTRAVENOUS | Status: DC

## 2024-06-16 NOTE — Progress Notes (Signed)
 "  [facility] PROGRESS NOTE   Beverly [name] FMW:[id number] DOB: [birth year] DOA: 06/05/2024  PCP: Melvenia Corean SAILOR, NP  Brief History: 43 y.o. 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  She was discharged home after that surgical procedure.  She presented to the general surgeons office on 12/15 with complaints of increased pain in her wound and persistent drainage from the wound.  She was directly admitted to the hospital.  There was concern for wound infection.  She underwent incision and drainage of the abdominal wall wound on 12/15.  Based on microbiology it looks like she grew out MRSA and Pseudomonas from the wound cultures.  Because of adequate drainage and clean wound postoperatively she was not continued on antibiotics.  She was given vancomycin  for surgical prophylaxis.  Plan was for wound care education and then subsequently discharge home.  Hospitalist service called for consultation since patient started complaining of nausea vomiting dizziness and loose stools.  Patient also mention symptoms suggestive of dysphagia.    Consultants: Gastroenterology.  Infectious disease.  General surgery  Procedures:  EXCISION, MASS, TORSO (N/A) - EXCISION SQUAMOUS ELL CANCER LOWER ABDOMINAL WALL  Colposcopy, Loop Electrosurgical Excisional  Incision and drainage of abdominal wall wound  EGD   Subjective/Interval History: Called by general surgery to reevaluate this patient with fever and persistent diarrhea.  Patient mentions that she feels very weak and lethargic.  Swallowing has improved though still not back to baseline.  Continues to have loose stools.  No other symptoms reported.    Assessment/Plan:  Acute diarrhea/norovirus infection Abdominal pain appears to be mostly secondary to her abdominal wound and recent surgery.   CT scan did not raise concern for inflammatory changes in the perineum,  anterior pubic wall, labia and urethral orifice.  However no fluid collection was noted.  Ulceration of the skin of the anterior pubic and labial area was also noted.  According to general surgery these are expected findings considering her recent surgical interventions. C. difficile antigen was positive but toxin negative.   Patient subsequently found to have norovirus.  It was felt that her symptoms were due to norovirus infection. However patient's diarrhea has not improved much in the last few days.  Will request ID to reevaluate. Abdomen remains tender but stable.  Considering recent CT scan no clear indication to repeat imaging studies. She does appear to be dehydrated though and so we will give her IV fluids.  Fever Noted to be febrile over the last 24 hours.  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  No clear source of infection other than norovirus identified.   She does have a knot in her left antecubital area which could suggest thrombophlebitis though there is no significant warmth or erythema noted.  Will do Doppler studies. Blood cultures have been ordered. ID to reevaluate patient.  Dysphagia/esophageal candidiasis Etiology unclear.  She does have history of HIV.  Differential diagnosis is broad.  She has never had endoscopy previously.  There is some whitish plaques in her mouth raising concern for candidiasis.  Patient underwent EGD which revealed esophageal candidiasis.  She will need Diflucan  for 3 weeks which has been ordered by infectious disease.   Abdominal wound/squamous cell carcinoma abdominal wall She is status post excision of abdominal wound which was positive for squamous cell carcinoma.  Subsequently developed wound infection and underwent I&D.  Wound cultures grew MRSA and Pseudomonas however  since most of the infection was surgically removed it was felt by surgery team that antibiotics were not necessary.  She has not been on antibiotics.   CT scan  shows inflammatory changes without any fluid collection.  She is afebrile.  She is leukopenic. Defer further management to general surgery.  According to general surgery her wound looks clean.  Abnormal UA/positive urine culture Abnormal UA noted.  Urine culture growing Citrobacter and Klebsiella.  Asymptomatic bacteriuria per infectious disease.  No need for antibiotics.   HIV/AIDS She is on Biktarvy  and atovaquone .  Seen by infectious disease. CD4 count is less than 35.   Dizziness/borderline low blood pressure Fluids have been ordered.  Leukopenia/normocytic anemia/folic acid  deficiency Most likely due to HIV/chronic disease.  No evidence of overt bleeding.  Recently checked iron panel in September reviewed.  Ferritin was 92, iron 63, TIBC 259, percent saturation 24. Folic acid  level noted to be low and will be supplemented. Drop in hemoglobin is dilutional.  No evidence of overt blood loss.   Check differential.  Hypomagnesemia/hyponatremia Magnesium  has been supplemented.  Sodium level has improved with IV hydration.  DVT Prophylaxis: Lovenox  Code Status: Full code Family Communication: Discussed with patient Disposition Plan: Hopefully return home when improved     Medications: Scheduled:  acetaminophen   1,000 mg Oral Q6H   atovaquone   1,500 mg Oral Daily   bictegravir-emtricitabine -tenofovir  AF  1 tablet Oral Daily   enoxaparin  (LOVENOX ) injection  40 mg Subcutaneous Q24H   fluconazole   200 mg Oral Daily   folic acid   1 mg Oral Daily   gabapentin   100 mg Oral TID   lamoTRIgine   25 mg Oral q morning   methocarbamol   500 mg Oral QID   nystatin   5 mL Mouth/Throat QID   pantoprazole   40 mg Oral Daily   pregabalin   25 mg Oral BID   Continuous:  sodium chloride      Followed by   sodium chloride       PRN:acetaminophen , acetaminophen , albuterol , alum & mag hydroxide-simeth, diphenhydrAMINE  **OR** diphenhydrAMINE , HYDROmorphone  (DILAUDID ) injection, loperamide ,  LORazepam , LORazepam , melatonin, menthol , methocarbamol  (ROBAXIN ) injection, naphazoline-glycerin , ondansetron  (ZOFRAN ) IV, ondansetron  **OR** [DISCONTINUED] ondansetron  (ZOFRAN ) IV, oxyCODONE , phenol, prochlorperazine , sodium chloride   Objective:  Vital Signs  Vitals:   06/15/24 2020 06/16/24 0031 06/16/24 0439 06/16/24 0921  BP: 109/78 102/66 96/70 101/62  Pulse: (!) 110 89 99 (!) 107  Resp: 19 18 18 16   Temp: (!) 100.4 F (38 C) 99 F (37.2 C) 99.2 F (37.3 C) (!) 100.9 F (38.3 C)  TempSrc: Oral Oral Oral   SpO2: 98% 98% 100% 100%  Weight:      Height:        Intake/Output Summary (Last 24 hours) at 06/16/2024 1112 Last data filed at 06/15/2024 1215 Gross per 24 hour  Intake 60 ml  Output --  Net 60 ml   Filed Weights   06/05/24 1625  Weight: 53 kg   General appearance: Lethargic but arousable Resp: Clear to auscultation bilaterally.  Normal effort Cardio: S1-S2 is normal regular.  No S3-S4.  No rubs murmurs or bruit GI: Abdomen is soft.  Diffusely tender without any rebound acidity or guarding. Extremities: Mild swelling/knot appreciated in the left antecubital area Neurologic: No focal neurological deficits  Lab Results:  Data Reviewed: I have personally reviewed following labs and reports of the imaging studies  CBC: Recent Labs  Lab 06/11/24 1529 06/12/24 0550 06/13/24 0333 06/16/24 0316  WBC 2.4* 1.8* 1.7* 1.5*  NEUTROABS 1.4*  --   --   --  HGB 9.5* 8.2* 7.7* 7.9*  HCT 30.6* 26.6* 25.1* 26.0*  MCV 80.3 81.1 81.5 81.3  PLT 476* 403* 378 341    Basic Metabolic Panel: Recent Labs  Lab 06/11/24 1529 06/12/24 0550 06/13/24 0333 06/16/24 0316  NA 130* 134* 134* 132*  K 3.6 3.8 3.7 3.8  CL 97* 104 105 98  CO2 20* 20* 20* 23  GLUCOSE 104* 102* 93 110*  BUN 11 12 11 8   CREATININE 0.89 0.84 0.78 0.89  CALCIUM  8.7* 8.4* 8.3* 8.7*  MG 1.4* 2.4 1.7  --   PHOS 3.2  --   --   --     GFR: Estimated Creatinine Clearance: 62.8 mL/min (by C-G  formula based on SCr of 0.89 mg/dL).  Liver Function Tests: Recent Labs  Lab 06/11/24 1529 06/16/24 0316  AST 17 15  ALT 7 6  ALKPHOS 143* 151*  BILITOT 0.4 0.6  PROT 8.9* 8.1  ALBUMIN 3.0* 2.8*    Recent Labs  Lab 06/11/24 1529  LIPASE 22   Coagulation Profile: Recent Labs  Lab 06/11/24 1529  INR 1.1    Recent Results (from the past 240 hours)  Urine Culture (for pregnant, neutropenic or urologic patients or patients with an indwelling urinary catheter)     Status: Abnormal   Collection Time: 06/11/24  9:50 AM   Specimen: Urine, Clean Catch  Result Value Ref Range Status   Specimen Description   Final    URINE, CLEAN CATCH Performed at [facility], [address], [place], [place] [address]    Special Requests   Final    NONE Performed at [facility], [address] [place], [place] [address]    Culture (A)  Final    >=100,000 COLONIES/mL CITROBACTER FREUNDII 60,000 COLONIES/mL KLEBSIELLA PNEUMONIAE    Report Status 06/15/2024 FINAL  Final   Organism ID, Bacteria CITROBACTER FREUNDII (A)  Final   Organism ID, Bacteria KLEBSIELLA PNEUMONIAE (A)  Final      Susceptibility   Citrobacter freundii - MIC*    CEFEPIME  <=0.12 SENSITIVE Sensitive     ERTAPENEM <=0.12 SENSITIVE Sensitive     CEFTRIAXONE  0.5 SENSITIVE Sensitive     CIPROFLOXACIN  <=0.06 SENSITIVE Sensitive     GENTAMICIN <=1 SENSITIVE Sensitive     NITROFURANTOIN  <=16 SENSITIVE Sensitive     TRIMETH /SULFA  <=20 SENSITIVE Sensitive     PIP/TAZO Value in next row Sensitive      <=4 SENSITIVEThis is a modified FDA-approved test that has been validated and its performance characteristics determined by the reporting laboratory.  This laboratory is certified under the Clinical Laboratory Improvement Amendments CLIA as qualified to perform high complexity clinical laboratory testing.    MEROPENEM Value in next row Sensitive      <=4 SENSITIVEThis is a modified  FDA-approved test that has been validated and its performance characteristics determined by the reporting laboratory.  This laboratory is certified under the Clinical Laboratory Improvement Amendments CLIA as qualified to perform high complexity clinical laboratory testing.    * >=100,000 COLONIES/mL CITROBACTER FREUNDII   Klebsiella pneumoniae - MIC*    AMPICILLIN  Value in next row Resistant      <=4 SENSITIVEThis is a modified FDA-approved test that has been validated and its performance characteristics determined by the reporting laboratory.  This laboratory is certified under the Clinical Laboratory Improvement Amendments CLIA as qualified to perform high complexity clinical laboratory testing.    CEFAZOLIN  (URINE) Value in next row Sensitive  2 SENSITIVEThis is a modified FDA-approved test that has been validated and its performance characteristics determined by the reporting laboratory.  This laboratory is certified under the Clinical Laboratory Improvement Amendments CLIA as qualified to perform high complexity clinical laboratory testing.    CEFEPIME  Value in next row Sensitive      2 SENSITIVEThis is a modified FDA-approved test that has been validated and its performance characteristics determined by the reporting laboratory.  This laboratory is certified under the Clinical Laboratory Improvement Amendments CLIA as qualified to perform high complexity clinical laboratory testing.    ERTAPENEM Value in next row Sensitive      2 SENSITIVEThis is a modified FDA-approved test that has been validated and its performance characteristics determined by the reporting laboratory.  This laboratory is certified under the Clinical Laboratory Improvement Amendments CLIA as qualified to perform high complexity clinical laboratory testing.    CEFTRIAXONE  Value in next row Sensitive      2 SENSITIVEThis is a modified FDA-approved test that has been validated and its performance characteristics determined by  the reporting laboratory.  This laboratory is certified under the Clinical Laboratory Improvement Amendments CLIA as qualified to perform high complexity clinical laboratory testing.    CIPROFLOXACIN  Value in next row Sensitive      2 SENSITIVEThis is a modified FDA-approved test that has been validated and its performance characteristics determined by the reporting laboratory.  This laboratory is certified under the Clinical Laboratory Improvement Amendments CLIA as qualified to perform high complexity clinical laboratory testing.    GENTAMICIN Value in next row Sensitive      2 SENSITIVEThis is a modified FDA-approved test that has been validated and its performance characteristics determined by the reporting laboratory.  This laboratory is certified under the Clinical Laboratory Improvement Amendments CLIA as qualified to perform high complexity clinical laboratory testing.    NITROFURANTOIN  Value in next row Intermediate      2 SENSITIVEThis is a modified FDA-approved test that has been validated and its performance characteristics determined by the reporting laboratory.  This laboratory is certified under the Clinical Laboratory Improvement Amendments CLIA as qualified to perform high complexity clinical laboratory testing.    TRIMETH /SULFA  Value in next row Sensitive      2 SENSITIVEThis is a modified FDA-approved test that has been validated and its performance characteristics determined by the reporting laboratory.  This laboratory is certified under the Clinical Laboratory Improvement Amendments CLIA as qualified to perform high complexity clinical laboratory testing.    AMPICILLIN /SULBACTAM Value in next row Sensitive      2 SENSITIVEThis is a modified FDA-approved test that has been validated and its performance characteristics determined by the reporting laboratory.  This laboratory is certified under the Clinical Laboratory Improvement Amendments CLIA as qualified to perform high complexity  clinical laboratory testing.    PIP/TAZO Value in next row Sensitive      <=4 SENSITIVEThis is a modified FDA-approved test that has been validated and its performance characteristics determined by the reporting laboratory.  This laboratory is certified under the Clinical Laboratory Improvement Amendments CLIA as qualified to perform high complexity clinical laboratory testing.    MEROPENEM Value in next row Sensitive      <=4 SENSITIVEThis is a modified FDA-approved test that has been validated and its performance characteristics determined by the reporting laboratory.  This laboratory is certified under the Clinical Laboratory Improvement Amendments CLIA as qualified to perform high complexity clinical laboratory testing.    * 60,000 COLONIES/mL KLEBSIELLA  PNEUMONIAE  Gastrointestinal Panel by PCR , Stool     Status: Abnormal   Collection Time: 06/12/24  9:52 PM   Specimen: STOOL  Result Value Ref Range Status   Campylobacter species NOT DETECTED NOT DETECTED Final   Plesimonas shigelloides NOT DETECTED NOT DETECTED Final   Salmonella species NOT DETECTED NOT DETECTED Final   Yersinia enterocolitica NOT DETECTED NOT DETECTED Final   Vibrio species NOT DETECTED NOT DETECTED Final   Vibrio cholerae NOT DETECTED NOT DETECTED Final   Enteroaggregative E coli (EAEC) NOT DETECTED NOT DETECTED Final   Enteropathogenic E coli (EPEC) NOT DETECTED NOT DETECTED Final   Enterotoxigenic E coli (ETEC) NOT DETECTED NOT DETECTED Final   Shiga like toxin producing E coli (STEC) NOT DETECTED NOT DETECTED Final   Shigella/Enteroinvasive E coli (EIEC) NOT DETECTED NOT DETECTED Final   Cryptosporidium NOT DETECTED NOT DETECTED Final   Cyclospora cayetanensis NOT DETECTED NOT DETECTED Final   Entamoeba histolytica NOT DETECTED NOT DETECTED Final   Giardia lamblia NOT DETECTED NOT DETECTED Final   Adenovirus F40/41 NOT DETECTED NOT DETECTED Final   Astrovirus NOT DETECTED NOT DETECTED Final   Norovirus GI/GII  DETECTED (A) NOT DETECTED Final    Comment: RESULT CALLED TO, READ BACK BY AND VERIFIED WITH: LAMAR NINE RN @2114  06/13/24 ASW    Rotavirus A NOT DETECTED NOT DETECTED Final   Sapovirus (I, II, IV, and V) DETECTED (A) NOT DETECTED Final    Comment: Performed at [facility], [address] [place], [place] [address]  C Difficile Quick Screen (NO PCR Reflex)     Status: Abnormal   Collection Time: 06/12/24  9:52 PM   Specimen: STOOL  Result Value Ref Range Status   C Diff antigen POSITIVE (A) NEGATIVE Final   C Diff toxin NEGATIVE NEGATIVE Final   C Diff interpretation   Final    Results are indeterminate. Please contact the provider listed for your campus for C diff questions in AMION.    Comment: Performed at [facility], [address] [place], [place] [address]      Radiology Studies: No results found.     LOS: 10 days   Deborha Moseley [employer] on [email or web address]  06/16/2024, 11:12 AM   "

## 2024-06-16 NOTE — TOC Progression Note (Signed)
 Transition of Care [facility]) - Progression Note    Patient Details  Name: Beverly [name] MRN: [id number] Date of Birth: 1980-06-30  Transition of Care [facility]) CM/SW Contact  NORMAN ASPEN, LCSW Phone Number: 06/16/2024, 1:12 PM  Clinical Narrative:     Met with pt today to follow up on discharge plans and to review, again, the barriers with setting up any [facility] for wound care.  Pt confirms that she now has arrangements in place to discharge to her sister's home in [place] but was uncertain of exact address.  Pt indicates that her sister is able to assist with wound care.  Pt agreeable for CSW to speak with sister and have left a VM at # on record.   Reviewed with pt that, unfortunately, this CSW is unable to secure any home health services due to having only the [facility] [employer] coverage.  All local agencies have declined the referral.  Because of this issue, the initial plan for wound VAC was changed to regular wound dressing changes.  Again, await return contact from sister just to confirm she is able to assist with wound care at discharge.  Expected Discharge Plan: Home/Self Care Barriers to Discharge: No Home Care Agency will accept this patient, Inadequate or no insurance, Continued Medical Work up               Expected Discharge Plan and Services In-house Referral: Clinical Social Work     Living arrangements for the past 2 months: Apartment                                       Social Drivers of Health (SDOH) Interventions SDOH Screenings   Food Insecurity: Food Insecurity Present (06/05/2024)  Housing: High Risk (06/05/2024)  Transportation Needs: No Transportation Needs (06/05/2024)  Utilities: At Risk (06/05/2024)  Depression (PHQ2-9): Medium Risk (04/06/2024)  Tobacco Use: High Risk (06/13/2024)    Readmission Risk Interventions    06/07/2024    2:41 PM 02/13/2024   10:23 AM  Readmission Risk Prevention Plan  Transportation Screening Complete  Complete  PCP or Specialist Appt within 5-7 Days  Not Complete  Not Complete comments  N/A, pt has Medicaid  PCP or Specialist Appt within 3-5 Days Complete   Home Care Screening  Complete  Medication Review (RN CM)  Complete  HRI or Home Care Consult Complete   Social Work Consult for Recovery Care Planning/Counseling Complete   Palliative Care Screening Not Applicable   Medication Review Oceanographer) Complete

## 2024-06-16 NOTE — Progress Notes (Signed)
 3 Days Post-Op   Subjective/Chief Complaint: New fevers since yesterday up to 101.  Still with some nausea, vomiting, and diarrhea.  3BMs yesterday.  Not wanting to eat anything  Objective: Vital signs in last 24 hours: Temp:  [98.6 F (37 C)-101 F (38.3 C)] 100.9 F (38.3 C) (12/26 0921) Pulse Rate:  [81-117] 107 (12/26 0921) Resp:  [14-19] 16 (12/26 0921) BP: (96-126)/(62-78) 101/62 (12/26 0921) SpO2:  [98 %-100 %] 100 % (12/26 0921) Last BM Date : 06/15/24  Intake/Output from previous day: 12/25 0701 - 12/26 0700 In: 540 [P.O.:540] Out: -  Intake/Output this shift: No intake/output data recorded.  PE: Gen: NAD, but laying in bed and appears to not feel well Heart: mildly tachy Lungs: CTAB Abd: soft, wound is clear with good beefy red granulation tissue present  Lab Results:  Recent Labs    06/16/24 0316  WBC 1.5*  HGB 7.9*  HCT 26.0*  PLT 341   BMET Recent Labs    06/16/24 0316  NA 132*  K 3.8  CL 98  CO2 23  GLUCOSE 110*  BUN 8  CREATININE 0.89  CALCIUM  8.7*   PT/INR No results for input(s): LABPROT, INR in the last 72 hours.  ABG No results for input(s): PHART, HCO3 in the last 72 hours.  Invalid input(s): PCO2, PO2  Studies/Results: No results found.   Anti-infectives: Anti-infectives (From admission, onward)    Start     Dose/Rate Route Frequency Ordered Stop   06/15/24 0000  fluconazole  (DIFLUCAN ) 200 MG tablet        200 mg Oral Daily 06/14/24 1023 07/03/24 2359   06/12/24 1330  fluconazole  (DIFLUCAN ) tablet 200 mg        200 mg Oral Daily 06/12/24 1230 07/03/24 0959   06/06/24 1000  bictegravir-emtricitabine -tenofovir  AF (BIKTARVY ) 50-200-25 MG per tablet 1 tablet        1 tablet Oral Daily 06/05/24 1927     06/06/24 1000  atovaquone  (MEPRON ) 750 MG/5ML suspension 1,500 mg        1,500 mg Oral Daily 06/05/24 1927     06/05/24 1645  vancomycin  (VANCOCIN ) IVPB 1000 mg/200 mL premix        1,000 mg 200 mL/hr over 60  Minutes Intravenous On call to O.R. 06/05/24 1628 06/05/24 2040   06/05/24 1631  vancomycin  (VANCOCIN ) 1-5 GM/200ML-% IVPB       Note to Pharmacy: [name] W: cabinet override      06/05/24 1631 06/06/24 0444       Assessment/Plan: POD 11, s/p drainage of wound infection by Dr. 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 Rogelio (GYN)  -reg diet as able, but continues to have some N/V/D.  No sbo on CT several days ago -Continue wet to dry dressing changes -Off abx for her wound. Wound clean with no evidence of infection -EGD 12/23- probable candidiasis, needs 3 weeks diflucan  per ID -HH not able to be arranged for patient as her insurance is out of network with all agencies. -we will continue to follow her wound  FEN - regular diet as able VTE - Lovenox  ID - diflucan   Norovirus - still with some N/V/D.  Doubt fever is related to this AIDS per ID New fevers - tmax of 101, unclear etiology.  I have reached back out to medicine and ID for assistance with further work up.  Medicine has graciously offered to accept the patient onto their service.  This is greatly appreciated.  We will continue to follow her wound.    LOS: 10 days    Burnard FORBES Banter 06/16/2024

## 2024-06-16 NOTE — Progress Notes (Signed)
 VASCULAR LAB    Left upper extremity venous duplex has been performed.  See CV proc for preliminary results.   Cadden Elizondo, RVT 06/16/2024, 2:20 PM

## 2024-06-16 NOTE — Plan of Care (Signed)
" °  Problem: Clinical Measurements: Goal: Ability to maintain clinical measurements within normal limits will improve Outcome: Progressing Goal: Will remain free from infection Outcome: Progressing Goal: Diagnostic test results will improve Outcome: Progressing Goal: Respiratory complications will improve Outcome: Progressing   Problem: Activity: Goal: Risk for activity intolerance will decrease Outcome: Adequate for Discharge   Problem: Nutrition: Goal: Adequate nutrition will be maintained Outcome: Progressing   Problem: Coping: Goal: Level of anxiety will decrease Outcome: Progressing   Problem: Pain Managment: Goal: General experience of comfort will improve and/or be controlled Outcome: Progressing   Problem: Safety: Goal: Ability to remain free from injury will improve Outcome: Progressing   Problem: Skin Integrity: Goal: Risk for impaired skin integrity will decrease Outcome: Progressing   "

## 2024-06-17 DIAGNOSIS — R509 Fever, unspecified: Secondary | ICD-10-CM

## 2024-06-17 DIAGNOSIS — D72819 Decreased white blood cell count, unspecified: Secondary | ICD-10-CM | POA: Diagnosis not present

## 2024-06-17 DIAGNOSIS — I809 Phlebitis and thrombophlebitis of unspecified site: Secondary | ICD-10-CM

## 2024-06-17 DIAGNOSIS — R8271 Bacteriuria: Secondary | ICD-10-CM

## 2024-06-17 DIAGNOSIS — B370 Candidal stomatitis: Secondary | ICD-10-CM | POA: Diagnosis not present

## 2024-06-17 DIAGNOSIS — T8149XA Infection following a procedure, other surgical site, initial encounter: Secondary | ICD-10-CM | POA: Diagnosis not present

## 2024-06-17 DIAGNOSIS — B20 Human immunodeficiency virus [HIV] disease: Secondary | ICD-10-CM | POA: Diagnosis not present

## 2024-06-17 DIAGNOSIS — A09 Infectious gastroenteritis and colitis, unspecified: Secondary | ICD-10-CM | POA: Diagnosis not present

## 2024-06-17 LAB — CBC WITH DIFFERENTIAL/PLATELET
Abs Immature Granulocytes: 0.02 K/uL (ref 0.00–0.07)
Basophils Absolute: 0 K/uL (ref 0.0–0.1)
Basophils Relative: 1 %
Eosinophils Absolute: 0.1 K/uL (ref 0.0–0.5)
Eosinophils Relative: 3 %
HCT: 24 % — ABNORMAL LOW (ref 36.0–46.0)
Hemoglobin: 7.4 g/dL — ABNORMAL LOW (ref 12.0–15.0)
Immature Granulocytes: 1 %
Lymphocytes Relative: 30 %
Lymphs Abs: 0.4 K/uL — ABNORMAL LOW (ref 0.7–4.0)
MCH: 25.1 pg — ABNORMAL LOW (ref 26.0–34.0)
MCHC: 30.8 g/dL (ref 30.0–36.0)
MCV: 81.4 fL (ref 80.0–100.0)
Monocytes Absolute: 0.4 K/uL (ref 0.1–1.0)
Monocytes Relative: 27 %
Neutro Abs: 0.6 K/uL — ABNORMAL LOW (ref 1.7–7.7)
Neutrophils Relative %: 38 %
Platelets: 339 K/uL (ref 150–400)
RBC: 2.95 MIL/uL — ABNORMAL LOW (ref 3.87–5.11)
RDW: 18 % — ABNORMAL HIGH (ref 11.5–15.5)
WBC: 1.5 K/uL — ABNORMAL LOW (ref 4.0–10.5)
nRBC: 0 % (ref 0.0–0.2)

## 2024-06-17 LAB — BASIC METABOLIC PANEL WITH GFR
Anion gap: 9 (ref 5–15)
BUN: 10 mg/dL (ref 6–20)
CO2: 23 mmol/L (ref 22–32)
Calcium: 8.5 mg/dL — ABNORMAL LOW (ref 8.9–10.3)
Chloride: 102 mmol/L (ref 98–111)
Creatinine, Ser: 0.86 mg/dL (ref 0.44–1.00)
GFR, Estimated: >60 mL/min
Glucose, Bld: 93 mg/dL (ref 70–99)
Potassium: 3.9 mmol/L (ref 3.5–5.1)
Sodium: 134 mmol/L — ABNORMAL LOW (ref 135–145)

## 2024-06-17 LAB — LACTATE DEHYDROGENASE: LDH: 168 U/L (ref 105–235)

## 2024-06-17 MED ORDER — SODIUM CHLORIDE 0.9 % IV BOLUS
500.0000 mL | Freq: Once | INTRAVENOUS | Status: AC
  Administered 2024-06-17: 500 mL via INTRAVENOUS

## 2024-06-17 MED ORDER — SODIUM CHLORIDE 0.9 % IV SOLN: INTRAVENOUS | Status: AC

## 2024-06-17 NOTE — Plan of Care (Signed)
   Problem: Nutrition: Goal: Adequate nutrition will be maintained Outcome: Progressing

## 2024-06-17 NOTE — Progress Notes (Signed)
 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 Communicated with surgery who noted wound not concerning  for infection  Plan: Given GIP + norovirus+ Sapovirus.  Given age status with viral load less than 35 I think norovirus may have a prolonged presentation leading to diarrhea and fevers a few days out.  Would hold off on C. difficile treatment, C. difficile antigen positive toxin negative.  If patient continues to fever then consider repeat CT abdomen pelvis.  Plan communicated with primary Continue art and pjp ppx.  Last viral load on 12/23 -30 Continue fluconzole -Enteric precautions - Dr. Overton is covering this weekend Microbiology:   Antibiotics: Biktarvy  atovaqoune, fluconazole  Cultures: Blood  Urine  Other   SUBJECTIVE: Resting in bed. Besides diarrhea no other complaints Interval: 100.9 overnight. diarrhea  Review of Systems: Review of Systems  All other systems reviewed and are  negative.    Scheduled Meds:  acetaminophen   1,000 mg Oral Q6H   atovaquone   1,500 mg Oral Daily   bictegravir-emtricitabine -tenofovir  AF  1 tablet Oral Daily   enoxaparin  (LOVENOX ) injection  40 mg Subcutaneous Q24H   fluconazole   200 mg Oral Daily   folic acid   1 mg Oral Daily   gabapentin   100 mg Oral TID   lamoTRIgine   25 mg Oral q morning   methocarbamol   500 mg Oral QID   nystatin   5 mL Mouth/Throat QID   pantoprazole   40 mg Oral Daily   pregabalin   25 mg Oral BID   Continuous Infusions:  sodium chloride  75 mL/hr at 06/16/24 2256   PRN Meds:.acetaminophen , acetaminophen , albuterol , alum & mag hydroxide-simeth, diphenhydrAMINE  **OR** diphenhydrAMINE , HYDROmorphone  (DILAUDID ) injection, loperamide , LORazepam , LORazepam , melatonin, menthol , methocarbamol  (ROBAXIN ) injection, naphazoline-glycerin , ondansetron  (ZOFRAN ) IV, ondansetron  **OR** [DISCONTINUED] ondansetron  (ZOFRAN ) IV, oxyCODONE , phenol, prochlorperazine , sodium chloride  Allergies[1]  OBJECTIVE: Vitals:   06/16/24 0921 06/16/24 1414 06/16/24 2249 06/17/24 0204  BP: 101/62 (!) 86/58 121/82 116/89  Pulse: (!) 107 88 (!) 120 (!) 118  Resp: 16 20 18 15   Temp: (!) 100.9 F (38.3 C) 99.6 F (37.6 C) 99.2 F (37.3 C) 99.3 F (37.4 C)  TempSrc:  Oral Oral Oral  SpO2: 100% 99% 100% 99%  Weight:      Height:       Body mass index is 22.08 kg/m.  Physical Exam Constitutional:      Appearance: Normal appearance.  HENT:     Head: Normocephalic and atraumatic.     Right Ear: Tympanic membrane normal.  Left Ear: Tympanic membrane normal.     Nose: Nose normal.     Mouth/Throat:     Mouth: Mucous membranes are moist.  Eyes:     Extraocular Movements: Extraocular movements intact.     Conjunctiva/sclera: Conjunctivae normal.     Pupils: Pupils are equal, round, and reactive to light.  Cardiovascular:     Rate and Rhythm: Normal rate and regular rhythm.     Heart sounds: No murmur heard.    No friction rub.  No gallop.  Pulmonary:     Effort: Pulmonary effort is normal.     Breath sounds: Normal breath sounds.  Abdominal:     General: Abdomen is flat.     Palpations: Abdomen is soft.  Musculoskeletal:        General: Normal range of motion.  Skin:    General: Skin is warm and dry.  Neurological:     General: No focal deficit present.     Mental Status: She is alert and oriented to person, place, and time.  Psychiatric:        Mood and Affect: Mood normal.       Lab Results Lab Results  Component Value Date   WBC 1.5 (L) 06/16/2024   HGB 7.9 (L) 06/16/2024   HCT 26.0 (L) 06/16/2024   MCV 81.3 06/16/2024   PLT 341 06/16/2024    Lab Results  Component Value Date   CREATININE 0.89 06/16/2024   BUN 8 06/16/2024   NA 132 (L) 06/16/2024   K 3.8 06/16/2024   CL 98 06/16/2024   CO2 23 06/16/2024    Lab Results  Component Value Date   ALT 6 06/16/2024   AST 15 06/16/2024   ALKPHOS 151 (H) 06/16/2024   BILITOT 0.6 06/16/2024        Loney Stank, MD [facility] 06/17/2024, 2:55 AM Evaluation of this patient requires complex antimicrobial therapy evaluation and counseling + isolation needs for disease transmission risk assessment and mitigation      [1]  Allergies Allergen Reactions   Bee Venom Anaphylaxis, Shortness Of Breath and Swelling   Morphine  Hives, Itching and Other (See Comments)    And- Tolerates hydrocodone , oxycodone , and codeine with no issues   Ferrlecit  [Na Ferric Gluc Cplx In Sucrose] Swelling and Other (See Comments)    Swelling and puffiness in hands and legs after Ferrlecit  250 mg infused over 60 minutes (possibly infusion related reaction)   Latex Hives   "

## 2024-06-17 NOTE — Progress Notes (Signed)
 "  [facility] PROGRESS NOTE   Beverly [name] FMW:[id number] DOB: 1980-12-28 DOA: 06/05/2024  PCP: Melvenia Corean SAILOR, NP  Brief History: 43 y.o. 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  She was discharged home after that surgical procedure.  She presented to the general surgeons office on 12/15 with complaints of increased pain in her wound and persistent drainage from the wound.  She was directly admitted to the hospital.  There was concern for wound infection.  She underwent incision and drainage of the abdominal wall wound on 12/15.  Based on microbiology it looks like she grew out MRSA and Pseudomonas from the wound cultures.  Because of adequate drainage and clean wound postoperatively she was not continued on antibiotics.  She was given vancomycin  for surgical prophylaxis.  Plan was for wound care education and then subsequently discharge home.  Hospitalist service called for consultation since patient started complaining of nausea vomiting dizziness and loose stools.  Patient also mention symptoms suggestive of dysphagia.    Consultants: Gastroenterology.  Infectious disease.  General surgery  Procedures:  EXCISION, MASS, TORSO (N/A) - EXCISION SQUAMOUS ELL CANCER LOWER ABDOMINAL WALL  Colposcopy, Loop Electrosurgical Excisional  Incision and drainage of abdominal wall wound  EGD   Subjective/Interval History: Patient mentioned that she may have passed small amount of blood in her stool yesterday afternoon.  None since then.  Overall she does feel better today compared to yesterday.  Diarrhea appears to be slowing down finally.  Fever appears to be subsiding.  Still having nausea with occasional episodes of vomiting.     Assessment/Plan:  Acute diarrhea/norovirus infection Abdominal pain appears to be mostly secondary to her abdominal wound and recent surgery.   CT scan did not raise concern for  inflammatory changes in the perineum, anterior pubic wall, labia and urethral orifice.  However no fluid collection was noted.  Ulceration of the skin of the anterior pubic and labial area was also noted.  According to general surgery these are expected findings considering her recent surgical interventions. C. difficile antigen was positive but toxin negative.   Patient subsequently found to have norovirus.  It was felt that her symptoms were due to norovirus infection. Patient has taken a very long time to improve.  Seen by ID again yesterday and they feel that this is just a protracted case of norovirus considering her HIV/AIDS status.  They do not think that the patient has C. difficile.   Unclear how much of blood she had in her stool yesterday.  It appears that it was just a one-time episode.  Hemoglobin is low but stable for the most part compared to yesterday morning.  Will recheck her labs again tomorrow.  If she has recurrence of her hematochezia then we may need to reconsult gastroenterology and repeat a CT scan.  Fever/left upper extremity superficial thrombophlebitis Noted to be febrile with a Tmax of 101.  Fever appears to be subsiding.  Etiology is unclear.  She is found to have superficial thrombophlebitis in the left antecubital area.  Continue with warm compresses. Cultures have been ordered and pending. Continue to monitor.    Dysphagia/esophageal candidiasis Etiology unclear.  She does have history of HIV.  Differential diagnosis is broad.  She has never had endoscopy previously.  There is some whitish plaques in her mouth raising concern for candidiasis.  Patient underwent EGD which revealed esophageal candidiasis.  She will need  Diflucan  for 3 weeks which has been ordered by infectious disease.   Nausea and vomiting Occasional episodes of nausea vomiting noted.  Mobilize.  Recent CT scan did not show any small bowel obstruction.  May consider doing abdominal x-ray if she has  recurrence of her symptoms.  Abdominal wound/squamous cell carcinoma abdominal wall She is status post excision of abdominal wound which was positive for squamous cell carcinoma.  Subsequently developed wound infection and underwent I&D.  Wound cultures grew MRSA and Pseudomonas however since most of the infection was surgically removed it was felt by surgery team that antibiotics were not necessary.  She has not been on antibiotics.   CT scan shows inflammatory changes without any fluid collection.   Defer further management to general surgery.  According to general surgery her wound looks clean.  Abnormal UA/positive urine culture Abnormal UA noted.  Urine culture growing Citrobacter and Klebsiella.  Asymptomatic bacteriuria per infectious disease.  No need for antibiotics.   HIV/AIDS She is on Biktarvy  and atovaquone .  Seen by infectious disease. CD4 count is less than 35.   Dizziness/borderline low blood pressure Continue with IV fluids for now.  Improvement noted today.  Leukopenia/neutropenia/normocytic anemia/folic acid  deficiency Most likely due to HIV/chronic disease.  No evidence of overt bleeding.  Recently checked iron panel in September reviewed.  Ferritin was 92, iron 63, TIBC 259, percent saturation 24. Folic acid  level noted to be low and will be supplemented. Drop in hemoglobin is dilutional.  No evidence of overt blood loss.   Leukopenia possibly due to acute infection in the setting of HIV AIDS.  Hopefully will rebound in a few days.  Hypomagnesemia/hyponatremia Magnesium  has been supplemented.  Sodium level has improved with IV hydration.  DVT Prophylaxis: Lovenox  Code Status: Full code Family Communication: Discussed with patient Disposition Plan: Hopefully return home when improved     Medications: Scheduled:  acetaminophen   1,000 mg Oral Q6H   atovaquone   1,500 mg Oral Daily   bictegravir-emtricitabine -tenofovir  AF  1 tablet Oral Daily   enoxaparin   (LOVENOX ) injection  40 mg Subcutaneous Q24H   fluconazole   200 mg Oral Daily   folic acid   1 mg Oral Daily   gabapentin   100 mg Oral TID   lamoTRIgine   25 mg Oral q morning   methocarbamol   500 mg Oral QID   nystatin   5 mL Mouth/Throat QID   pantoprazole   40 mg Oral Daily   pregabalin   25 mg Oral BID   Continuous:  sodium chloride  75 mL/hr at 06/16/24 2256    PRN:acetaminophen , acetaminophen , albuterol , alum & mag hydroxide-simeth, diphenhydrAMINE  **OR** diphenhydrAMINE , HYDROmorphone  (DILAUDID ) injection, loperamide , LORazepam , LORazepam , melatonin, menthol , methocarbamol  (ROBAXIN ) injection, naphazoline-glycerin , ondansetron  (ZOFRAN ) IV, ondansetron  **OR** [DISCONTINUED] ondansetron  (ZOFRAN ) IV, oxyCODONE , phenol, prochlorperazine , sodium chloride   Objective:  Vital Signs  Vitals:   06/16/24 1414 06/16/24 2249 06/17/24 0204 06/17/24 0601  BP: (!) 86/58 121/82 116/89 110/73  Pulse: 88 (!) 120 (!) 118 (!) 126  Resp: 20 18 15 16   Temp: 99.6 F (37.6 C) 99.2 F (37.3 C) 99.3 F (37.4 C) 99.3 F (37.4 C)  TempSrc: Oral Oral Oral Oral  SpO2: 99% 100% 99% 98%  Weight:      Height:        Intake/Output Summary (Last 24 hours) at 06/17/2024 0932 Last data filed at 06/17/2024 0600 Gross per 24 hour  Intake 1522.17 ml  Output 30 ml  Net 1492.17 ml   Filed Weights   06/05/24 1625  Weight: 53  kg   General appearance: Awake alert.  In no distress Resp: Clear to auscultation bilaterally.  Normal effort Cardio: S1-S2 is normal regular.  No S3-S4.  No rubs murmurs or bruit GI: Abdomen is soft.  Tender in the lower abdomen.  Dressing is noted. No focal neurological deficits.  Lab Results:  Data Reviewed: I have personally reviewed following labs and reports of the imaging studies  CBC: Recent Labs  Lab 06/11/24 1529 06/12/24 0550 06/13/24 0333 06/16/24 0316 06/17/24 0318  WBC 2.4* 1.8* 1.7* 1.5* 1.5*  NEUTROABS 1.4*  --   --   --  0.6*  HGB 9.5* 8.2* 7.7* 7.9* 7.4*   HCT 30.6* 26.6* 25.1* 26.0* 24.0*  MCV 80.3 81.1 81.5 81.3 81.4  PLT 476* 403* 378 341 339    Basic Metabolic Panel: Recent Labs  Lab 06/11/24 1529 06/12/24 0550 06/13/24 0333 06/16/24 0316 06/17/24 0318  NA 130* 134* 134* 132* 134*  K 3.6 3.8 3.7 3.8 3.9  CL 97* 104 105 98 102  CO2 20* 20* 20* 23 23  GLUCOSE 104* 102* 93 110* 93  BUN 11 12 11 8 10   CREATININE 0.89 0.84 0.78 0.89 0.86  CALCIUM  8.7* 8.4* 8.3* 8.7* 8.5*  MG 1.4* 2.4 1.7  --   --   PHOS 3.2  --   --   --   --     GFR: Estimated Creatinine Clearance: 65 mL/min (by C-G formula based on SCr of 0.86 mg/dL).  Liver Function Tests: Recent Labs  Lab 06/11/24 1529 06/16/24 0316  AST 17 15  ALT 7 6  ALKPHOS 143* 151*  BILITOT 0.4 0.6  PROT 8.9* 8.1  ALBUMIN 3.0* 2.8*    Recent Labs  Lab 06/11/24 1529  LIPASE 22   Coagulation Profile: Recent Labs  Lab 06/11/24 1529  INR 1.1    Recent Results (from the past 240 hours)  Urine Culture (for pregnant, neutropenic or urologic patients or patients with an indwelling urinary catheter)     Status: Abnormal   Collection Time: 06/11/24  9:50 AM   Specimen: Urine, Clean Catch  Result Value Ref Range Status   Specimen Description   Final    URINE, CLEAN CATCH Performed at [facility], [address] [place], [place] [address]    Special Requests   Final    NONE Performed at [facility], [address] [place], [place] [address]    Culture (A)  Final    >=100,000 COLONIES/mL CITROBACTER FREUNDII 60,000 COLONIES/mL KLEBSIELLA PNEUMONIAE    Report Status 06/15/2024 FINAL  Final   Organism ID, Bacteria CITROBACTER FREUNDII (A)  Final   Organism ID, Bacteria KLEBSIELLA PNEUMONIAE (A)  Final      Susceptibility   Citrobacter freundii - MIC*    CEFEPIME  <=0.12 SENSITIVE Sensitive     ERTAPENEM <=0.12 SENSITIVE Sensitive     CEFTRIAXONE  0.5 SENSITIVE Sensitive     CIPROFLOXACIN  <=0.06 SENSITIVE Sensitive      GENTAMICIN <=1 SENSITIVE Sensitive     NITROFURANTOIN  <=16 SENSITIVE Sensitive     TRIMETH /SULFA  <=20 SENSITIVE Sensitive     PIP/TAZO Value in next row Sensitive      <=4 SENSITIVEThis is a modified FDA-approved test that has been validated and its performance characteristics determined by the reporting laboratory.  This laboratory is certified under the Clinical Laboratory Improvement Amendments CLIA as qualified to perform high complexity clinical laboratory testing.    MEROPENEM Value in next row Sensitive      <=  4 SENSITIVEThis is a modified FDA-approved test that has been validated and its performance characteristics determined by the reporting laboratory.  This laboratory is certified under the Clinical Laboratory Improvement Amendments CLIA as qualified to perform high complexity clinical laboratory testing.    * >=100,000 COLONIES/mL CITROBACTER FREUNDII   Klebsiella pneumoniae - MIC*    AMPICILLIN  Value in next row Resistant      <=4 SENSITIVEThis is a modified FDA-approved test that has been validated and its performance characteristics determined by the reporting laboratory.  This laboratory is certified under the Clinical Laboratory Improvement Amendments CLIA as qualified to perform high complexity clinical laboratory testing.    CEFAZOLIN  (URINE) Value in next row Sensitive      2 SENSITIVEThis is a modified FDA-approved test that has been validated and its performance characteristics determined by the reporting laboratory.  This laboratory is certified under the Clinical Laboratory Improvement Amendments CLIA as qualified to perform high complexity clinical laboratory testing.    CEFEPIME  Value in next row Sensitive      2 SENSITIVEThis is a modified FDA-approved test that has been validated and its performance characteristics determined by the reporting laboratory.  This laboratory is certified under the Clinical Laboratory Improvement Amendments CLIA as qualified to perform high  complexity clinical laboratory testing.    ERTAPENEM Value in next row Sensitive      2 SENSITIVEThis is a modified FDA-approved test that has been validated and its performance characteristics determined by the reporting laboratory.  This laboratory is certified under the Clinical Laboratory Improvement Amendments CLIA as qualified to perform high complexity clinical laboratory testing.    CEFTRIAXONE  Value in next row Sensitive      2 SENSITIVEThis is a modified FDA-approved test that has been validated and its performance characteristics determined by the reporting laboratory.  This laboratory is certified under the Clinical Laboratory Improvement Amendments CLIA as qualified to perform high complexity clinical laboratory testing.    CIPROFLOXACIN  Value in next row Sensitive      2 SENSITIVEThis is a modified FDA-approved test that has been validated and its performance characteristics determined by the reporting laboratory.  This laboratory is certified under the Clinical Laboratory Improvement Amendments CLIA as qualified to perform high complexity clinical laboratory testing.    GENTAMICIN Value in next row Sensitive      2 SENSITIVEThis is a modified FDA-approved test that has been validated and its performance characteristics determined by the reporting laboratory.  This laboratory is certified under the Clinical Laboratory Improvement Amendments CLIA as qualified to perform high complexity clinical laboratory testing.    NITROFURANTOIN  Value in next row Intermediate      2 SENSITIVEThis is a modified FDA-approved test that has been validated and its performance characteristics determined by the reporting laboratory.  This laboratory is certified under the Clinical Laboratory Improvement Amendments CLIA as qualified to perform high complexity clinical laboratory testing.    TRIMETH /SULFA  Value in next row Sensitive      2 SENSITIVEThis is a modified FDA-approved test that has been validated and  its performance characteristics determined by the reporting laboratory.  This laboratory is certified under the Clinical Laboratory Improvement Amendments CLIA as qualified to perform high complexity clinical laboratory testing.    AMPICILLIN /SULBACTAM Value in next row Sensitive      2 SENSITIVEThis is a modified FDA-approved test that has been validated and its performance characteristics determined by the reporting laboratory.  This laboratory is certified under the Clinical Laboratory Improvement Amendments CLIA as  qualified to perform high complexity clinical laboratory testing.    PIP/TAZO Value in next row Sensitive      <=4 SENSITIVEThis is a modified FDA-approved test that has been validated and its performance characteristics determined by the reporting laboratory.  This laboratory is certified under the Clinical Laboratory Improvement Amendments CLIA as qualified to perform high complexity clinical laboratory testing.    MEROPENEM Value in next row Sensitive      <=4 SENSITIVEThis is a modified FDA-approved test that has been validated and its performance characteristics determined by the reporting laboratory.  This laboratory is certified under the Clinical Laboratory Improvement Amendments CLIA as qualified to perform high complexity clinical laboratory testing.    * 60,000 COLONIES/mL KLEBSIELLA PNEUMONIAE  Gastrointestinal Panel by PCR , Stool     Status: Abnormal   Collection Time: 06/12/24  9:52 PM   Specimen: STOOL  Result Value Ref Range Status   Campylobacter species NOT DETECTED NOT DETECTED Final   Plesimonas shigelloides NOT DETECTED NOT DETECTED Final   Salmonella species NOT DETECTED NOT DETECTED Final   Yersinia enterocolitica NOT DETECTED NOT DETECTED Final   Vibrio species NOT DETECTED NOT DETECTED Final   Vibrio cholerae NOT DETECTED NOT DETECTED Final   Enteroaggregative E coli (EAEC) NOT DETECTED NOT DETECTED Final   Enteropathogenic E coli (EPEC) NOT DETECTED NOT  DETECTED Final   Enterotoxigenic E coli (ETEC) NOT DETECTED NOT DETECTED Final   Shiga like toxin producing E coli (STEC) NOT DETECTED NOT DETECTED Final   Shigella/Enteroinvasive E coli (EIEC) NOT DETECTED NOT DETECTED Final   Cryptosporidium NOT DETECTED NOT DETECTED Final   Cyclospora cayetanensis NOT DETECTED NOT DETECTED Final   Entamoeba histolytica NOT DETECTED NOT DETECTED Final   Giardia lamblia NOT DETECTED NOT DETECTED Final   Adenovirus F40/41 NOT DETECTED NOT DETECTED Final   Astrovirus NOT DETECTED NOT DETECTED Final   Norovirus GI/GII DETECTED (A) NOT DETECTED Final    Comment: RESULT CALLED TO, READ BACK BY AND VERIFIED WITH: LAMAR NINE RN @2114  06/13/24 ASW    Rotavirus A NOT DETECTED NOT DETECTED Final   Sapovirus (I, II, IV, and V) DETECTED (A) NOT DETECTED Final    Comment: Performed at [facility], [address] [place], [place] [address]  C Difficile Quick Screen (NO PCR Reflex)     Status: Abnormal   Collection Time: 06/12/24  9:52 PM   Specimen: STOOL  Result Value Ref Range Status   C Diff antigen POSITIVE (A) NEGATIVE Final   C Diff toxin NEGATIVE NEGATIVE Final   C Diff interpretation   Final    Results are indeterminate. Please contact the provider listed for your campus for C diff questions in AMION.    Comment: Performed at [facility], [address], [place], [place] [address]  Culture, blood (Routine X 2) w Reflex to ID Panel     Status: None (Preliminary result)   Collection Time: 06/16/24 10:34 AM   Specimen: BLOOD RIGHT ARM  Result Value Ref Range Status   Specimen Description   Final    BLOOD RIGHT ARM Performed at [facility], [address] [place], [place] [address]    Special Requests   Final    BOTTLES DRAWN AEROBIC AND ANAEROBIC Blood Culture adequate volume Performed at [facility], [address] [place], [place] [address]    Culture PENDING  Incomplete   Report  Status PENDING  Incomplete  Culture, blood (Routine X  2) w Reflex to ID Panel     Status: None (Preliminary result)   Collection Time: 06/16/24 10:34 AM   Specimen: BLOOD RIGHT HAND  Result Value Ref Range Status   Specimen Description   Final    BLOOD RIGHT HAND Performed at [facility], [address], [place], [place] [address]    Special Requests   Final    BOTTLES DRAWN AEROBIC AND ANAEROBIC Blood Culture adequate volume Performed at [facility], [address], [place], [place] [address]    Culture PENDING  Incomplete   Report Status PENDING  Incomplete      Radiology Studies: VAS US  UPPER EXTREMITY VENOUS DUPLEX Result Date: 06/16/2024 UPPER VENOUS STUDY  Patient Name:  Beverly [name]  Date of Exam:   06/16/2024 Medical Rec #: [id number]         Accession #:    [id number] Date of Birth: Jan 15, 1981          Patient Gender: F Patient Age:   25 years Exam Location:  [facility] Procedure:      VAS US  UPPER EXTREMITY VENOUS DUPLEX Referring Phys: JOETTE PEBBLES --------------------------------------------------------------------------------  Indications: Pain and palpable cord at left [facility], site of prior IV Limitations: Bandages and line. Comparison Study: No prior study on file Performing Technologist: Alberta Lis RVS  Examination Guidelines: A complete evaluation includes B-mode imaging, spectral Doppler, color Doppler, and power Doppler as needed of all accessible portions of each vessel. Bilateral testing is considered an integral part of a complete examination. Limited examinations for reoccurring indications may be performed as noted.  Right Findings: +----------+------------+---------+-----------+----------+-------+ RIGHT     CompressiblePhasicitySpontaneousPropertiesSummary +----------+------------+---------+-----------+----------+-------+ Subclavian               Yes       Yes                       +----------+------------+---------+-----------+----------+-------+  Left Findings: +----------+------------+---------+-----------+----------+-----------+ LEFT      CompressiblePhasicitySpontaneousProperties  Summary   +----------+------------+---------+-----------+----------+-----------+ IJV           Full       Yes       Yes                          +----------+------------+---------+-----------+----------+-----------+ Subclavian    Full       Yes       Yes                          +----------+------------+---------+-----------+----------+-----------+ Axillary                 Yes       Yes                          +----------+------------+---------+-----------+----------+-----------+ Brachial      Full                                              +----------+------------+---------+-----------+----------+-----------+ Radial        Full                                              +----------+------------+---------+-----------+----------+-----------+  Ulnar         Full                                              +----------+------------+---------+-----------+----------+-----------+ Cephalic      None                                  Acute at [facility] +----------+------------+---------+-----------+----------+-----------+ Basilic       Full                                              +----------+------------+---------+-----------+----------+-----------+  Summary:  Right: No evidence of thrombosis in the subclavian.  Left: No evidence of deep vein thrombosis in the upper extremity. Findings consistent with acute superficial vein thrombosis involving the left cephalic vein at [facility].  *See table(s) above for measurements and observations.    Preliminary        LOS: 11 days   Savina Olshefski [employer] on [email or web address]  06/17/2024, 9:32 AM   "

## 2024-06-17 NOTE — Progress Notes (Addendum)
 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, complicated by abscess/infection s/p I&D 12/15, now with nonspecific dysphagia/diarrhea      #hiv/aids #leukopenia #thrush #diarrhea Hx noncompliance Restarted biktarvy  04/06/24 and per patient has taken daily Lab Results  Component Value Date   HIV1RNAQUANT 30 06/13/2024   Lab Results  Component Value Date   CD4TCELL 1 (L) 05/11/2024   CD4TABS <35 (L) 05/11/2024    Was on bactrim  pjp prophy previously but switched to atovaquone  due to concern for leukopenia 12/23 Egd suggestive of esophageal candida; no ulceration/mass/lesion seen otherwise    #sepsis Diarrhea 12/19 onset Fever 12/25 onset Unclear if related to norovirus gi infection or some other complication of aids A little far out for IRIS -- this is more of a semantic when iris is mentioned, as causes of sepsis will need to be worked up  Not much symptomatology at this time and something to consider is disseminated MAC. Doesn't appear to behave like hlh or KICS. Endemic fungi such as histoplasma a consideration.   With gi sx, gut associated histo/lymphoma/mac usual consideration here  She has on 12/21 a chest/abd/pelv ct with contrast that didn't reveal any intrathoracic pathology or intraabd pathology or lymphadenopathy though so considerations for above (mac/fungal) is less likely but still needs to be considered  Wound has been appearing healthy  Cmv infection usually is retinitis/radiculitis rather than pneumonitis/gi.   Lymphoma obviously is in the ddx even lack of lymphadenopathy  I don't see evidence of drug fever after a quick look at medication list. Last lft 12/26 normal.   She has a left antecubital fossa cordlike  tenderness ?dvt which potentially can cause fever  Fuo in the AIDS population is unlike hematologic fuo and the ddx remains within spectrum of cd4 <200, <100, <50 along with increased risk community acquired pna rather than typical bacterial/other invasive mold. Her hemodynamics also has been relatively stable and I do not suspect typical bacterial sepsis otherwise   I had discussed with gi previously if gi sx remains not much improved, given ongoing fever will need colonoscopy, but so far diarrhea/n/v is improving per patient as of 06/17/24    #thrombophlebitis 12/27 noticed left antecubital fossa cord like structure tender to touch Dvt can cause fever -- see above  Needs duplex and can do warm compress/nsaids    #assymptomatic bacteriuria At some point she had reported to dr Verdene dysuria thus ucx 12/21 tested (citrobacter freundii and kleb pna -- both senstive to bactrim ) However when I saw her 12/22 denies sx of uti 12/27 continues to lack sx of uti. I clarify with her what the burning reported previously and it seems she described the urine after it has come out and came in contact with the raw skin around the perineal area.      #hx 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/3 positive margin, repeated excision 05/31/24 for surgical site infection of the scc excision, s/p I&D by surgery 12/17 -presumed source control no abx post-op (mrsa, rare PsA -- r cipro , and gropu b strep) -12/20 wound pic appears healthy tissue  -observe and will see if recurrent infection will give abx     Plan: -  Continue biktarvy /atovaquone  -Continue fluconzole -ldh -blood afb culture (need transport culture tubes for afb from micro, then notify phlebotomist -- notified nursing) -histo urine ag -f/u primary team order of blood cx 12/27 -duplex u/s lue -continue supportive care for norovirus colitis -warm compress/nsaids per primary team for thrombophlebitis -no need for empiric abx  otherwise -maintain enteric precaution    Microbiology:   Antibiotics: Outpatient Biktarvy  atovaqoune 12/22-c fluconazole  plan 3 weeks  Cultures: Blood -- 12/27 in process 12/26 ngtd   Urine -- 12/21 >100k citrobacter freundii; 60k kleb pna -- both sensitive to bactrim   Other --   SUBJECTIVE: Ongoing fever Diarrhea improving No cough No rash No headache, visual change, numbness tingling No abd pain No joint pain No dysuria   Review of Systems: Review of Systems  All other systems reviewed and are negative.    Scheduled Meds:  acetaminophen   1,000 mg Oral Q6H   atovaquone   1,500 mg Oral Daily   bictegravir-emtricitabine -tenofovir  AF  1 tablet Oral Daily   enoxaparin  (LOVENOX ) injection  40 mg Subcutaneous Q24H   fluconazole   200 mg Oral Daily   folic acid   1 mg Oral Daily   lamoTRIgine   25 mg Oral q morning   methocarbamol   500 mg Oral QID   nystatin   5 mL Mouth/Throat QID   pantoprazole   40 mg Oral Daily   pregabalin   25 mg Oral BID   Continuous Infusions:  sodium chloride  75 mL/hr at 06/17/24 0830   PRN Meds:.acetaminophen , acetaminophen , albuterol , alum & mag hydroxide-simeth, diphenhydrAMINE  **OR** diphenhydrAMINE , HYDROmorphone  (DILAUDID ) injection, loperamide , LORazepam , LORazepam , melatonin, menthol , methocarbamol  (ROBAXIN ) injection, naphazoline-glycerin , ondansetron  (ZOFRAN ) IV, ondansetron  **OR** [DISCONTINUED] ondansetron  (ZOFRAN ) IV, oxyCODONE , phenol, prochlorperazine , sodium chloride  Allergies[1]  OBJECTIVE: Vitals:   06/16/24 2249 06/17/24 0204 06/17/24 0601 06/17/24 1015  BP: 121/82 116/89 110/73 121/64  Pulse: (!) 120 (!) 118 (!) 126 (!) 125  Resp: 18 15 16 17   Temp: 99.2 F (37.3 C) 99.3 F (37.4 C) 99.3 F (37.4 C) (!) 103.1 F (39.5 C)  TempSrc: Oral Oral Oral Oral  SpO2: 100% 99% 98% 99%  Weight:      Height:       Body mass index is 22.08 kg/m.  Exam: General/constitutional: no distress, pleasant HEENT: Normocephalic,  PER, Conj Clear, EOMI, Oropharynx clear Neck supple CV: rrr no mrg Lungs: clear to auscultation, normal respiratory effort Abd: Soft, Nontender -- dressing c/d/i Ext: no edema Skin: No Rash -- hard tender cord like structure left antecubital fossa -- no warmth/purulence Neuro: nonfocal MSK: no peripheral joint swelling/tenderness/warmth; back spines nontender     Lab Results Lab Results  Component Value Date   WBC 1.5 (L) 06/17/2024   HGB 7.4 (L) 06/17/2024   HCT 24.0 (L) 06/17/2024   MCV 81.4 06/17/2024   PLT 339 06/17/2024    Lab Results  Component Value Date   CREATININE 0.86 06/17/2024   BUN 10 06/17/2024   NA 134 (L) 06/17/2024   K 3.9 06/17/2024   CL 102 06/17/2024   CO2 23 06/17/2024    Lab Results  Component Value Date   ALT 6 06/16/2024   AST 15 06/16/2024   ALKPHOS 151 (H) 06/16/2024   BILITOT 0.6 06/16/2024        Constance ONEIDA Passer, MD [facility] 06/17/2024, 1:17 PM Evaluation of this patient requires complex antimicrobial therapy evaluation and counseling + isolation needs for disease transmission risk assessment and mitigation       [  1]  Allergies Allergen Reactions   Bee Venom Anaphylaxis, Shortness Of Breath and Swelling   Morphine  Hives, Itching and Other (See Comments)    And- Tolerates hydrocodone , oxycodone , and codeine with no issues   Ferrlecit  [Na Ferric Gluc Cplx In Sucrose] Swelling and Other (See Comments)    Swelling and puffiness in hands and legs after Ferrlecit  250 mg infused over 60 minutes (possibly infusion related reaction)   Latex Hives   "

## 2024-06-17 NOTE — Progress Notes (Signed)
 4 Days Post-Op   Subjective/Chief Complaint: No fevers in last 24h.  Feeling a bit better  Objective: Vital signs in last 24 hours: Temp:  [99.2 F (37.3 C)-99.6 F (37.6 C)] 99.3 F (37.4 C) (12/27 0601) Pulse Rate:  [88-126] 126 (12/27 0601) Resp:  [15-20] 16 (12/27 0601) BP: (86-121)/(58-89) 110/73 (12/27 0601) SpO2:  [98 %-100 %] 98 % (12/27 0601) Last BM Date : 06/16/24  Intake/Output from previous day: 12/26 0701 - 12/27 0700 In: 1522.2 [P.O.:120; I.V.:902.2; IV Piggyback:500] Out: 30 [Emesis/NG output:30] Intake/Output this shift: Total I/O In: 120 [P.O.:120] Out: -   PE: Gen: NAD Heart: mildly tachy Lungs: CTAB Abd: soft, wound is clear with good beefy red granulation tissue present  Lab Results:  Recent Labs    06/16/24 0316 06/17/24 0318  WBC 1.5* 1.5*  HGB 7.9* 7.4*  HCT 26.0* 24.0*  PLT 341 339   BMET Recent Labs    06/16/24 0316 06/17/24 0318  NA 132* 134*  K 3.8 3.9  CL 98 102  CO2 23 23  GLUCOSE 110* 93  BUN 8 10  CREATININE 0.89 0.86  CALCIUM  8.7* 8.5*   PT/INR No results for input(s): LABPROT, INR in the last 72 hours.  ABG No results for input(s): PHART, HCO3 in the last 72 hours.  Invalid input(s): PCO2, PO2  Studies/Results: VAS US  UPPER EXTREMITY VENOUS DUPLEX Result Date: 06/16/2024 UPPER VENOUS STUDY  Patient Name:  Beverly [name]  Date of Exam:   06/16/2024 Medical Rec #: [id number]         Accession #:    [id number] Date of Birth: Feb 17, 1981          Patient Gender: F Patient Age:   43 years Exam Location:  [facility] Procedure:      VAS US  UPPER EXTREMITY VENOUS DUPLEX Referring Phys: JOETTE PEBBLES --------------------------------------------------------------------------------  Indications: Pain and palpable cord at left [facility], site of prior IV Limitations: Bandages and line. Comparison Study: No prior study on file Performing Technologist: Alberta Lis RVS  Examination Guidelines: A complete evaluation  includes B-mode imaging, spectral Doppler, color Doppler, and power Doppler as needed of all accessible portions of each vessel. Bilateral testing is considered an integral part of a complete examination. Limited examinations for reoccurring indications may be performed as noted.  Right Findings: +----------+------------+---------+-----------+----------+-------+ RIGHT     CompressiblePhasicitySpontaneousPropertiesSummary +----------+------------+---------+-----------+----------+-------+ Subclavian               Yes       Yes                      +----------+------------+---------+-----------+----------+-------+  Left Findings: +----------+------------+---------+-----------+----------+-----------+ LEFT      CompressiblePhasicitySpontaneousProperties  Summary   +----------+------------+---------+-----------+----------+-----------+ IJV           Full       Yes       Yes                          +----------+------------+---------+-----------+----------+-----------+ Subclavian    Full       Yes       Yes                          +----------+------------+---------+-----------+----------+-----------+ Axillary                 Yes       Yes                          +----------+------------+---------+-----------+----------+-----------+  Brachial      Full                                              +----------+------------+---------+-----------+----------+-----------+ Radial        Full                                              +----------+------------+---------+-----------+----------+-----------+ Ulnar         Full                                              +----------+------------+---------+-----------+----------+-----------+ Cephalic      None                                  Acute at [facility] +----------+------------+---------+-----------+----------+-----------+ Basilic       Full                                               +----------+------------+---------+-----------+----------+-----------+  Summary:  Right: No evidence of thrombosis in the subclavian.  Left: No evidence of deep vein thrombosis in the upper extremity. Findings consistent with acute superficial vein thrombosis involving the left cephalic vein at [facility].  *See table(s) above for measurements and observations.    Preliminary      Anti-infectives: Anti-infectives (From admission, onward)    Start     Dose/Rate Route Frequency Ordered Stop   06/15/24 0000  fluconazole  (DIFLUCAN ) 200 MG tablet        200 mg Oral Daily 06/14/24 1023 07/03/24 2359   06/12/24 1330  fluconazole  (DIFLUCAN ) tablet 200 mg        200 mg Oral Daily 06/12/24 1230 07/03/24 0959   06/06/24 1000  bictegravir-emtricitabine -tenofovir  AF (BIKTARVY ) 50-200-25 MG per tablet 1 tablet        1 tablet Oral Daily 06/05/24 1927     06/06/24 1000  atovaquone  (MEPRON ) 750 MG/5ML suspension 1,500 mg        1,500 mg Oral Daily 06/05/24 1927     06/05/24 1645  vancomycin  (VANCOCIN ) IVPB 1000 mg/200 mL premix        1,000 mg 200 mL/hr over 60 Minutes Intravenous On call to O.R. 06/05/24 1628 06/05/24 2040   06/05/24 1631  vancomycin  (VANCOCIN ) 1-5 GM/200ML-% IVPB       Note to Pharmacy: [name] W: cabinet override      06/05/24 1631 06/06/24 0444       Assessment/Plan: POD 11, s/p drainage of wound infection by Dr. 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 Rogelio (GYN)  -reg diet as able, but continues to have some N/V/D.  No sbo on CT several days ago -Continue wet to dry dressing changes -Off abx for her wound. Wound clean with no evidence of infection -EGD 12/23- probable candidiasis, needs 3 weeks diflucan  per  ID -HH not able to be arranged for patient as her insurance is out of network with all agencies. -we will continue to follow her wound  FEN - regular diet  as able VTE - Lovenox  ID - diflucan , Biktarvy   Norovirus - having less N/V/D.  AIDS per ID       LOS: 11 days    Beverly [name] 06/17/2024

## 2024-06-18 DIAGNOSIS — B370 Candidal stomatitis: Secondary | ICD-10-CM | POA: Diagnosis not present

## 2024-06-18 DIAGNOSIS — I809 Phlebitis and thrombophlebitis of unspecified site: Secondary | ICD-10-CM | POA: Diagnosis not present

## 2024-06-18 DIAGNOSIS — R509 Fever, unspecified: Secondary | ICD-10-CM | POA: Diagnosis not present

## 2024-06-18 DIAGNOSIS — R197 Diarrhea, unspecified: Secondary | ICD-10-CM | POA: Diagnosis not present

## 2024-06-18 DIAGNOSIS — T8149XA Infection following a procedure, other surgical site, initial encounter: Secondary | ICD-10-CM | POA: Diagnosis not present

## 2024-06-18 LAB — HEMOGLOBIN AND HEMATOCRIT, BLOOD
HCT: 21.8 % — ABNORMAL LOW (ref 36.0–46.0)
HCT: 26.7 % — ABNORMAL LOW (ref 36.0–46.0)
Hemoglobin: 6.8 g/dL — CL (ref 12.0–15.0)
Hemoglobin: 8.2 g/dL — ABNORMAL LOW (ref 12.0–15.0)

## 2024-06-18 LAB — CBC WITH DIFFERENTIAL/PLATELET
Abs Immature Granulocytes: 0.03 K/uL (ref 0.00–0.07)
Basophils Absolute: 0 K/uL (ref 0.0–0.1)
Basophils Relative: 1 %
Eosinophils Absolute: 0.1 K/uL (ref 0.0–0.5)
Eosinophils Relative: 5 %
HCT: 21.8 % — ABNORMAL LOW (ref 36.0–46.0)
Hemoglobin: 6.7 g/dL — CL (ref 12.0–15.0)
Immature Granulocytes: 2 %
Lymphocytes Relative: 37 %
Lymphs Abs: 0.7 K/uL (ref 0.7–4.0)
MCH: 24.8 pg — ABNORMAL LOW (ref 26.0–34.0)
MCHC: 30.7 g/dL (ref 30.0–36.0)
MCV: 80.7 fL (ref 80.0–100.0)
Monocytes Absolute: 0.4 K/uL (ref 0.1–1.0)
Monocytes Relative: 23 %
Neutro Abs: 0.6 K/uL — ABNORMAL LOW (ref 1.7–7.7)
Neutrophils Relative %: 32 %
Platelets: 304 K/uL (ref 150–400)
RBC: 2.7 MIL/uL — ABNORMAL LOW (ref 3.87–5.11)
RDW: 18.1 % — ABNORMAL HIGH (ref 11.5–15.5)
WBC: 1.7 K/uL — ABNORMAL LOW (ref 4.0–10.5)
nRBC: 0 % (ref 0.0–0.2)

## 2024-06-18 LAB — COMPREHENSIVE METABOLIC PANEL WITH GFR
ALT: <5 U/L (ref 0–44)
AST: 16 U/L (ref 15–41)
Albumin: 2.4 g/dL — ABNORMAL LOW (ref 3.5–5.0)
Alkaline Phosphatase: 124 U/L (ref 38–126)
Anion gap: 11 (ref 5–15)
BUN: 9 mg/dL (ref 6–20)
CO2: 20 mmol/L — ABNORMAL LOW (ref 22–32)
Calcium: 8.1 mg/dL — ABNORMAL LOW (ref 8.9–10.3)
Chloride: 103 mmol/L (ref 98–111)
Creatinine, Ser: 0.89 mg/dL (ref 0.44–1.00)
GFR, Estimated: >60 mL/min
Glucose, Bld: 88 mg/dL (ref 70–99)
Potassium: 3.1 mmol/L — ABNORMAL LOW (ref 3.5–5.1)
Sodium: 133 mmol/L — ABNORMAL LOW (ref 135–145)
Total Bilirubin: 0.4 mg/dL (ref 0.0–1.2)
Total Protein: 7 g/dL (ref 6.5–8.1)

## 2024-06-18 LAB — PREPARE RBC (CROSSMATCH)

## 2024-06-18 MED ORDER — SODIUM CHLORIDE 0.9 % IV SOLN: INTRAVENOUS | Status: AC

## 2024-06-18 MED ORDER — POTASSIUM CHLORIDE CRYS ER 20 MEQ PO TBCR
40.0000 meq | EXTENDED_RELEASE_TABLET | Freq: Once | ORAL | Status: AC
  Administered 2024-06-18: 40 meq via ORAL
  Filled 2024-06-18: qty 2

## 2024-06-18 MED ORDER — SODIUM CHLORIDE 0.9% IV SOLUTION: Freq: Once | INTRAVENOUS | Status: AC

## 2024-06-18 NOTE — Plan of Care (Signed)
   Problem: Coping: Goal: Level of anxiety will decrease Outcome: Progressing

## 2024-06-18 NOTE — Progress Notes (Addendum)
 " PROGRESS NOTE    Beverly [name]  FMW:[id number] DOB: 1980/11/22 DOA: 06/05/2024 PCP: Melvenia Corean SAILOR, NP  No chief complaint on file.   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  She was discharged home after the procedure.  She then presented to the surgery office on 12/15 with increased pain around her wound and persistent drainage.  She was direct admitted to the hospital.  There was concern for wound infection.  She underwent I&D of the abdominal wound on 12/15.  Wound grew MRSA and Pseudomonas, but because of adequate drainage and clean wound postoperatively she was not continued on antibiotics.  She was given vancomycin  for surgical prophylaxis.  Surgery planned for wound education and discharge home but patient began experiencing nausea, vomiting, dizziness, diarrhea and the hospitalist service was consulted. Patient was found to have norovirus and esophageal candidiasis.  Infectious disease was consulted.  Subjective: No Fever overnight.  Hemoglobin down trended further.  Overnight NP has ordered for 1 unit PRBC. This morning patient reports she is already had 3 bowel movements.  She reports her abdominal pain is improving.  She is actively eating breakfast during exam   Objective: Vitals:   06/17/24 1729 06/17/24 2337 06/18/24 0153 06/18/24 0644  BP: 131/86 114/87 105/75 116/85  Pulse: (!) 117 (!) 109 96 91  Resp: 18 18 16 18   Temp: 98.4 F (36.9 C) 99 F (37.2 C) 98.2 F (36.8 C) 99 F (37.2 C)  TempSrc:  Oral Oral   SpO2: 100% 99% 98% 100%  Weight:      Height:        Intake/Output Summary (Last 24 hours) at 06/18/2024 0810 Last data filed at 06/18/2024 0600 Gross per 24 hour  Intake 2648.44 ml  Output --  Net 2648.44 ml   Filed Weights   06/05/24 1625  Weight: 53 kg    Examination: General exam: Appears calm and comfortable, NAD  Respiratory  system: No work of breathing, symmetric chest wall expansion Cardiovascular system: S1 & S2 heard, RRR.  Gastrointestinal system: Abdomen is nondistended, soft does not appear to be exquisitely tender to palpation Neuro: Alert and oriented. No focal neurological deficits.  Assessment & Plan:  Principal Problem:   Wound infection after surgery Active Problems:   Diarrhea   Esophageal candidiasis (HCC)   Thrush   Fever   Thrombophlebitis    Acute diarrhea Norovirus infection - Abdominal pain appears to be secondary to recent surgery - CT: inflammatory changes in perineum, anterior pubic wall, labia, urethral orifice - No fluid collection noted on CT.  Ulceration of the skin of anterior pubic and labial areas noted - General Surgery has reviewed scan and reports these are expected findings considering recent surgical intervention - C. difficile antigen positive but toxin negative - Patient has taken long time to heal. - ID is consulted and they feel this is a protracted case of norovirus due to her HIV/AIDS status.  ID maintains no concern for active C. difficile infection -  Cont IVF  Vulvar lesions - Multiple white plaques involving the mons, periclitoral area and right labia majora.  Patient taken to the OR for excisional biopsies with OB/GYN on 12/10. 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 - Hemoglobin has dropped now to 6.8, 1 unit PRBC ordered.  Repeat CBC in a.m. - No overt bleeding noted.  Abdominal exam does not reveal point tenderness.  Patient appears to be moving easily in bed.  Consider repeat CT  abdomen if hemoglobin continues to drop  - Iron panel checked in September.  Ferritin 92, iron 63, TIBC 259 TSAT 24 - Folic acid  low.  Continue with supplementation  Hematochezia -  x1.  Has resolved now.  Hemoglobin continues to downtrend.  If she experiences melena or hematochezia again we will consult gastroenterology for colonoscopy  Fever - May be secondary to the above - Blood cultures: Negative.  Will follow to completion - Continue to monitor fever curve closely.  No fever for the last 24 hours. -ID following.  Patient still pending urine histo antigen and blood AFB cx for MAC testing.  ID has discussed directly with RN on how to obtain. - Per ID Histoplasma should be back before discharge.  MAC testing can be followed up in clinic - If fever does not improve may need to consider bone marrow biopsy  Left upper extremity superficial thrombophlebitis - Superficial thrombophlebitis in left antecubital area.  Continue with warm compress  Esophageal candidiasis - Status post EGD 12/23.  Biopsies negative for malignancy - Diflucan  x 3 weeks  Nausea and vomiting, resolved - No evidence of SBO on CT - Continue to monitor closely  Abdominal wound Squamous of carcinoma abdominal wall - 12/10 excision of abdominal wound which was positive for squamous cell carcinoma - Postoperative course complicated by wound infection requiring I&D 12/15 - Wound cultures with MRSA and Pseudomonas however surgical team felt antibiotics not necessary and deemed patient had adequate source control in OR - CT scan shows inflammatory changes without fluid collection - General Surgery following wound, wet-to-dry dressings - Continue with regular diet - Reportedly patient cannot receive home health services  Abnormal urinalysis Urine culture with Citrobacter and Klebsiella - ID following reports asymptomatic bacteriuria no need for antibiotics  HIV AIDS Leukopenia, neutropenia, normocytic anemia -  CD4 count less than 35 - On Biktarvy  and atovaquone  - ID following  Hypomagnesemia Hyponatremia - Continue supplement as needed  Dizziness Hypotension - On IV fluids with some improvement continue monitor closely   DVT prophylaxis: Lovneox   Code Status: Full Code Disposition: Inpatient pending clinical resolution.  Apparently unable to arrange for home health due to patient insurance being out of network with all agencies.  Hopefully will be able to DC to sister's home.  Consultants:    Procedures:  Drainage of wound infection by Dr. Dasie 12/15  Excision of SCC on abdominal wall by Dr. Curvin 12/10 multiple punch biopsies of the mons area, excision vulva lesions by Dr. Rogelio (GYN)   Antimicrobials:  Anti-infectives (From admission, onward)    Start     Dose/Rate Route Frequency Ordered Stop   06/15/24 0000  fluconazole  (DIFLUCAN ) 200 MG tablet        200 mg Oral Daily 06/14/24 1023 07/03/24 2359   06/12/24 1330  fluconazole  (DIFLUCAN ) tablet 200 mg        200 mg Oral Daily 06/12/24 1230 07/03/24 0959   06/06/24 1000  bictegravir-emtricitabine -tenofovir  AF (BIKTARVY ) 50-200-25 MG per tablet 1 tablet        1 tablet Oral Daily  06/05/24 1927     06/06/24 1000  atovaquone  (MEPRON ) 750 MG/5ML suspension 1,500 mg        1,500 mg Oral Daily 06/05/24 1927     06/05/24 1645  vancomycin  (VANCOCIN ) IVPB 1000 mg/200 mL premix        1,000 mg 200 mL/hr over 60 Minutes Intravenous On call to O.R. 06/05/24 1628 06/05/24 2040   06/05/24 1631  vancomycin  (VANCOCIN ) 1-5 GM/200ML-% IVPB       Note to Pharmacy: [name] W: cabinet override      06/05/24 1631 06/06/24 0444       Data Reviewed: I have personally reviewed following labs and imaging studies CBC: Recent Labs  Lab 06/11/24 1529 06/12/24 0550 06/13/24 0333 06/16/24 0316 06/17/24 0318 06/18/24 0337 06/18/24 0444  WBC 2.4* 1.8* 1.7* 1.5* 1.5* 1.7*  --   NEUTROABS 1.4*  --   --   --  0.6* 0.6*  --   HGB  9.5* 8.2* 7.7* 7.9* 7.4* 6.7* 6.8*  HCT 30.6* 26.6* 25.1* 26.0* 24.0* 21.8* 21.8*  MCV 80.3 81.1 81.5 81.3 81.4 80.7  --   PLT 476* 403* 378 341 339 304  --    Basic Metabolic Panel: Recent Labs  Lab 06/11/24 1529 06/12/24 0550 06/13/24 0333 06/16/24 0316 06/17/24 0318 06/18/24 0337  NA 130* 134* 134* 132* 134* 133*  K 3.6 3.8 3.7 3.8 3.9 3.1*  CL 97* 104 105 98 102 103  CO2 20* 20* 20* 23 23 20*  GLUCOSE 104* 102* 93 110* 93 88  BUN 11 12 11 8 10 9   CREATININE 0.89 0.84 0.78 0.89 0.86 0.89  CALCIUM  8.7* 8.4* 8.3* 8.7* 8.5* 8.1*  MG 1.4* 2.4 1.7  --   --   --   PHOS 3.2  --   --   --   --   --    GFR: Estimated Creatinine Clearance: 62.8 mL/min (by C-G formula based on SCr of 0.89 mg/dL). Liver Function Tests: Recent Labs  Lab 06/11/24 1529 06/16/24 0316 06/18/24 0337  AST 17 15 16   ALT 7 6 <5  ALKPHOS 143* 151* 124  BILITOT 0.4 0.6 0.4  PROT 8.9* 8.1 7.0  ALBUMIN 3.0* 2.8* 2.4*   CBG: No results for input(s): GLUCAP in the last 168 hours.  Recent Results (from the past 240 hours)  Urine Culture (for pregnant, neutropenic or urologic patients or patients with an indwelling urinary catheter)     Status: Abnormal   Collection Time: 06/11/24  9:50 AM   Specimen: Urine, Clean Catch  Result Value Ref Range Status   Specimen Description   Final    URINE, CLEAN CATCH Performed at [facility], [address] [place], [place] [address]    Special Requests   Final    NONE Performed at [facility], [address], [place], [place] [address]    Culture (A)  Final    >=100,000 COLONIES/mL CITROBACTER FREUNDII 60,000 COLONIES/mL KLEBSIELLA PNEUMONIAE    Report Status 06/15/2024 FINAL  Final   Organism ID, Bacteria CITROBACTER FREUNDII (A)  Final   Organism ID, Bacteria KLEBSIELLA PNEUMONIAE (A)  Final      Susceptibility   Citrobacter freundii - MIC*    CEFEPIME  <=0.12 SENSITIVE Sensitive     ERTAPENEM <=0.12 SENSITIVE  Sensitive     CEFTRIAXONE  0.5 SENSITIVE Sensitive     CIPROFLOXACIN  <=0.06 SENSITIVE Sensitive     GENTAMICIN <=1 SENSITIVE Sensitive     NITROFURANTOIN  <=16  SENSITIVE Sensitive     TRIMETH /SULFA  <=20 SENSITIVE Sensitive     PIP/TAZO Value in next row Sensitive      <=4 SENSITIVEThis is a modified FDA-approved test that has been validated and its performance characteristics determined by the reporting laboratory.  This laboratory is certified under the Clinical Laboratory Improvement Amendments CLIA as qualified to perform high complexity clinical laboratory testing.    MEROPENEM Value in next row Sensitive      <=4 SENSITIVEThis is a modified FDA-approved test that has been validated and its performance characteristics determined by the reporting laboratory.  This laboratory is certified under the Clinical Laboratory Improvement Amendments CLIA as qualified to perform high complexity clinical laboratory testing.    * >=100,000 COLONIES/mL CITROBACTER FREUNDII   Klebsiella pneumoniae - MIC*    AMPICILLIN  Value in next row Resistant      <=4 SENSITIVEThis is a modified FDA-approved test that has been validated and its performance characteristics determined by the reporting laboratory.  This laboratory is certified under the Clinical Laboratory Improvement Amendments CLIA as qualified to perform high complexity clinical laboratory testing.    CEFAZOLIN  (URINE) Value in next row Sensitive      2 SENSITIVEThis is a modified FDA-approved test that has been validated and its performance characteristics determined by the reporting laboratory.  This laboratory is certified under the Clinical Laboratory Improvement Amendments CLIA as qualified to perform high complexity clinical laboratory testing.    CEFEPIME  Value in next row Sensitive      2 SENSITIVEThis is a modified FDA-approved test that has been validated and its performance characteristics determined by the reporting laboratory.  This laboratory is  certified under the Clinical Laboratory Improvement Amendments CLIA as qualified to perform high complexity clinical laboratory testing.    ERTAPENEM Value in next row Sensitive      2 SENSITIVEThis is a modified FDA-approved test that has been validated and its performance characteristics determined by the reporting laboratory.  This laboratory is certified under the Clinical Laboratory Improvement Amendments CLIA as qualified to perform high complexity clinical laboratory testing.    CEFTRIAXONE  Value in next row Sensitive      2 SENSITIVEThis is a modified FDA-approved test that has been validated and its performance characteristics determined by the reporting laboratory.  This laboratory is certified under the Clinical Laboratory Improvement Amendments CLIA as qualified to perform high complexity clinical laboratory testing.    CIPROFLOXACIN  Value in next row Sensitive      2 SENSITIVEThis is a modified FDA-approved test that has been validated and its performance characteristics determined by the reporting laboratory.  This laboratory is certified under the Clinical Laboratory Improvement Amendments CLIA as qualified to perform high complexity clinical laboratory testing.    GENTAMICIN Value in next row Sensitive      2 SENSITIVEThis is a modified FDA-approved test that has been validated and its performance characteristics determined by the reporting laboratory.  This laboratory is certified under the Clinical Laboratory Improvement Amendments CLIA as qualified to perform high complexity clinical laboratory testing.    NITROFURANTOIN  Value in next row Intermediate      2 SENSITIVEThis is a modified FDA-approved test that has been validated and its performance characteristics determined by the reporting laboratory.  This laboratory is certified under the Clinical Laboratory Improvement Amendments CLIA as qualified to perform high complexity clinical laboratory testing.    TRIMETH /SULFA  Value in next  row Sensitive      2 SENSITIVEThis is a modified FDA-approved test that  has been validated and its performance characteristics determined by the reporting laboratory.  This laboratory is certified under the Clinical Laboratory Improvement Amendments CLIA as qualified to perform high complexity clinical laboratory testing.    AMPICILLIN /SULBACTAM Value in next row Sensitive      2 SENSITIVEThis is a modified FDA-approved test that has been validated and its performance characteristics determined by the reporting laboratory.  This laboratory is certified under the Clinical Laboratory Improvement Amendments CLIA as qualified to perform high complexity clinical laboratory testing.    PIP/TAZO Value in next row Sensitive      <=4 SENSITIVEThis is a modified FDA-approved test that has been validated and its performance characteristics determined by the reporting laboratory.  This laboratory is certified under the Clinical Laboratory Improvement Amendments CLIA as qualified to perform high complexity clinical laboratory testing.    MEROPENEM Value in next row Sensitive      <=4 SENSITIVEThis is a modified FDA-approved test that has been validated and its performance characteristics determined by the reporting laboratory.  This laboratory is certified under the Clinical Laboratory Improvement Amendments CLIA as qualified to perform high complexity clinical laboratory testing.    * 60,000 COLONIES/mL KLEBSIELLA PNEUMONIAE  Gastrointestinal Panel by PCR , Stool     Status: Abnormal   Collection Time: 06/12/24  9:52 PM   Specimen: STOOL  Result Value Ref Range Status   Campylobacter species NOT DETECTED NOT DETECTED Final   Plesimonas shigelloides NOT DETECTED NOT DETECTED Final   Salmonella species NOT DETECTED NOT DETECTED Final   Yersinia enterocolitica NOT DETECTED NOT DETECTED Final   Vibrio species NOT DETECTED NOT DETECTED Final   Vibrio cholerae NOT DETECTED NOT DETECTED Final   Enteroaggregative E  coli (EAEC) NOT DETECTED NOT DETECTED Final   Enteropathogenic E coli (EPEC) NOT DETECTED NOT DETECTED Final   Enterotoxigenic E coli (ETEC) NOT DETECTED NOT DETECTED Final   Shiga like toxin producing E coli (STEC) NOT DETECTED NOT DETECTED Final   Shigella/Enteroinvasive E coli (EIEC) NOT DETECTED NOT DETECTED Final   Cryptosporidium NOT DETECTED NOT DETECTED Final   Cyclospora cayetanensis NOT DETECTED NOT DETECTED Final   Entamoeba histolytica NOT DETECTED NOT DETECTED Final   Giardia lamblia NOT DETECTED NOT DETECTED Final   Adenovirus F40/41 NOT DETECTED NOT DETECTED Final   Astrovirus NOT DETECTED NOT DETECTED Final   Norovirus GI/GII DETECTED (A) NOT DETECTED Final    Comment: RESULT CALLED TO, READ BACK BY AND VERIFIED WITH: LAMAR NINE RN @2114  06/13/24 ASW    Rotavirus A NOT DETECTED NOT DETECTED Final   Sapovirus (I, II, IV, and V) DETECTED (A) NOT DETECTED Final    Comment: Performed at [facility], [address] [place], [place] [address]  C Difficile Quick Screen (NO PCR Reflex)     Status: Abnormal   Collection Time: 06/12/24  9:52 PM   Specimen: STOOL  Result Value Ref Range Status   C Diff antigen POSITIVE (A) NEGATIVE Final   C Diff toxin NEGATIVE NEGATIVE Final   C Diff interpretation   Final    Results are indeterminate. Please contact the provider listed for your campus for C diff questions in AMION.    Comment: Performed at [facility], [address] [place], [place] [address]  Culture, blood (Routine X 2) w Reflex to ID Panel     Status: None (Preliminary result)   Collection Time: 06/16/24 10:34 AM   Specimen: BLOOD RIGHT ARM  Result Value Ref Range  Status   Specimen Description   Final    BLOOD RIGHT ARM Performed at [facility], [address] [place], [place] [address]    Special Requests   Final    BOTTLES DRAWN AEROBIC AND ANAEROBIC Blood Culture adequate volume Performed at [facility], [address], [place], [place] [address]    Culture   Final    NO GROWTH < 24 HOURS Performed at [facility], [address], [place], [place] [address]    Report Status PENDING  Incomplete  Culture, blood (Routine X 2) w Reflex to ID Panel     Status: None (Preliminary result)   Collection Time: 06/16/24 10:34 AM   Specimen: BLOOD RIGHT HAND  Result Value Ref Range Status   Specimen Description   Final    BLOOD RIGHT HAND Performed at [facility], [address], [place], [place] [address]    Special Requests   Final    BOTTLES DRAWN AEROBIC AND ANAEROBIC Blood Culture adequate volume Performed at [facility], [address] [place], [place] [address]    Culture   Final    NO GROWTH < 24 HOURS Performed at [facility], [address], [place], [place] [address]    Report Status PENDING  Incomplete     Radiology Studies: VAS US  UPPER EXTREMITY VENOUS DUPLEX Result Date: 06/16/2024 UPPER VENOUS STUDY  Patient Name:  Beverly [name]  Date of Exam:   06/16/2024 Medical Rec #: [id number]         Accession #:    [id number] Date of Birth: January 29, 1981          Patient Gender: F Patient Age:   59 years Exam Location:  [facility] Procedure:      VAS US  UPPER EXTREMITY VENOUS DUPLEX Referring Phys: JOETTE PEBBLES --------------------------------------------------------------------------------  Indications: Pain and palpable cord at left [facility], site of prior IV Limitations: Bandages and line. Comparison Study: No prior study on file Performing Technologist: Alberta Lis RVS  Examination Guidelines: A complete evaluation includes B-mode imaging, spectral Doppler, color Doppler, and power Doppler as needed of all accessible portions of each vessel. Bilateral testing is considered an integral part of a complete examination. Limited examinations for reoccurring indications may be performed as noted.  Right Findings:  +----------+------------+---------+-----------+----------+-------+ RIGHT     CompressiblePhasicitySpontaneousPropertiesSummary +----------+------------+---------+-----------+----------+-------+ Subclavian               Yes       Yes                      +----------+------------+---------+-----------+----------+-------+  Left Findings: +----------+------------+---------+-----------+----------+-----------+ LEFT      CompressiblePhasicitySpontaneousProperties  Summary   +----------+------------+---------+-----------+----------+-----------+ IJV           Full       Yes       Yes                          +----------+------------+---------+-----------+----------+-----------+ Subclavian    Full       Yes       Yes                          +----------+------------+---------+-----------+----------+-----------+ Axillary                 Yes       Yes                          +----------+------------+---------+-----------+----------+-----------+  Brachial      Full                                              +----------+------------+---------+-----------+----------+-----------+ Radial        Full                                              +----------+------------+---------+-----------+----------+-----------+ Ulnar         Full                                              +----------+------------+---------+-----------+----------+-----------+ Cephalic      None                                  Acute at [facility] +----------+------------+---------+-----------+----------+-----------+ Basilic       Full                                              +----------+------------+---------+-----------+----------+-----------+  Summary:  Right: No evidence of thrombosis in the subclavian.  Left: No evidence of deep vein thrombosis in the upper extremity. Findings consistent with acute superficial vein thrombosis involving the left cephalic vein at [facility].  *See table(s) above  for measurements and observations.    Preliminary     Scheduled Meds:  sodium chloride    Intravenous Once   acetaminophen   1,000 mg Oral Q6H   atovaquone   1,500 mg Oral Daily   bictegravir-emtricitabine -tenofovir  AF  1 tablet Oral Daily   enoxaparin  (LOVENOX ) injection  40 mg Subcutaneous Q24H   fluconazole   200 mg Oral Daily   folic acid   1 mg Oral Daily   lamoTRIgine   25 mg Oral q morning   methocarbamol   500 mg Oral QID   nystatin   5 mL Mouth/Throat QID   pantoprazole   40 mg Oral Daily   pregabalin   25 mg Oral BID   Continuous Infusions:  sodium chloride  75 mL/hr at 06/17/24 1819     LOS: 12 days  MDM: Patient is high risk for one or more organ failure.  They necessitate ongoing hospitalization for continued IV therapies and subsequent lab monitoring. Total time spent interpreting labs and vitals, reviewing the medical record, coordinating care amongst consultants and care team members, directly assessing and discussing care with the patient and/or family: 55 min  Cylinda Santoli, DO [facility]  To contact the attending physician between 7A-7P please use Epic Chat. To contact the covering physician during after hours 7P-7A, please review Amion.  06/18/2024, 8:10 AM   *This document has been created with the assistance of dictation software. Please excuse typographical errors. *   "

## 2024-06-18 NOTE — Progress Notes (Signed)
 5 Days Post-Op   Subjective/Chief Complaint: Still having fevers.  I unit pRBC's given last night for hgb 6.7  Objective: Vital signs in last 24 hours: Temp:  [98.2 F (36.8 C)-103.1 F (39.5 C)] 99 F (37.2 C) (12/28 0644) Pulse Rate:  [91-125] 91 (12/28 0644) Resp:  [16-18] 18 (12/28 0644) BP: (96-131)/(64-87) 116/85 (12/28 0644) SpO2:  [98 %-100 %] 100 % (12/28 0644) Last BM Date : 06/16/24  Intake/Output from previous day: 12/27 0701 - 12/28 0700 In: 2648.4 [P.O.:1420; I.V.:1228.4] Out: -  Intake/Output this shift: No intake/output data recorded.  PE: Gen: NAD Heart: mildly tachy Lungs: CTAB Abd: soft, wound is clear with good beefy red granulation tissue present  Lab Results:  Recent Labs    06/17/24 0318 06/18/24 0337 06/18/24 0444  WBC 1.5* 1.7*  --   HGB 7.4* 6.7* 6.8*  HCT 24.0* 21.8* 21.8*  PLT 339 304  --    BMET Recent Labs    06/17/24 0318 06/18/24 0337  NA 134* 133*  K 3.9 3.1*  CL 102 103  CO2 23 20*  GLUCOSE 93 88  BUN 10 9  CREATININE 0.86 0.89  CALCIUM  8.5* 8.1*   PT/INR No results for input(s): LABPROT, INR in the last 72 hours.  ABG No results for input(s): PHART, HCO3 in the last 72 hours.  Invalid input(s): PCO2, PO2  Studies/Results: VAS US  UPPER EXTREMITY VENOUS DUPLEX Result Date: 06/16/2024 UPPER VENOUS STUDY  Patient Name:  Beverly [name]  Date of Exam:   06/16/2024 Medical Rec #: [id number]         Accession #:    [id number] Date of Birth: 11/24/1980          Patient Gender: F Patient Age:   43 years Exam Location:  [facility] Procedure:      VAS US  UPPER EXTREMITY VENOUS DUPLEX Referring Phys: JOETTE PEBBLES --------------------------------------------------------------------------------  Indications: Pain and palpable cord at left [facility], site of prior IV Limitations: Bandages and line. Comparison Study: No prior study on file Performing Technologist: Alberta Lis RVS  Examination Guidelines: A complete  evaluation includes B-mode imaging, spectral Doppler, color Doppler, and power Doppler as needed of all accessible portions of each vessel. Bilateral testing is considered an integral part of a complete examination. Limited examinations for reoccurring indications may be performed as noted.  Right Findings: +----------+------------+---------+-----------+----------+-------+ RIGHT     CompressiblePhasicitySpontaneousPropertiesSummary +----------+------------+---------+-----------+----------+-------+ Subclavian               Yes       Yes                      +----------+------------+---------+-----------+----------+-------+  Left Findings: +----------+------------+---------+-----------+----------+-----------+ LEFT      CompressiblePhasicitySpontaneousProperties  Summary   +----------+------------+---------+-----------+----------+-----------+ IJV           Full       Yes       Yes                          +----------+------------+---------+-----------+----------+-----------+ Subclavian    Full       Yes       Yes                          +----------+------------+---------+-----------+----------+-----------+ Axillary                 Yes       Yes                          +----------+------------+---------+-----------+----------+-----------+  Brachial      Full                                              +----------+------------+---------+-----------+----------+-----------+ Radial        Full                                              +----------+------------+---------+-----------+----------+-----------+ Ulnar         Full                                              +----------+------------+---------+-----------+----------+-----------+ Cephalic      None                                  Acute at [facility] +----------+------------+---------+-----------+----------+-----------+ Basilic       Full                                               +----------+------------+---------+-----------+----------+-----------+  Summary:  Right: No evidence of thrombosis in the subclavian.  Left: No evidence of deep vein thrombosis in the upper extremity. Findings consistent with acute superficial vein thrombosis involving the left cephalic vein at [facility].  *See table(s) above for measurements and observations.    Preliminary      Anti-infectives: Anti-infectives (From admission, onward)    Start     Dose/Rate Route Frequency Ordered Stop   06/15/24 0000  fluconazole  (DIFLUCAN ) 200 MG tablet        200 mg Oral Daily 06/14/24 1023 07/03/24 2359   06/12/24 1330  fluconazole  (DIFLUCAN ) tablet 200 mg        200 mg Oral Daily 06/12/24 1230 07/03/24 0959   06/06/24 1000  bictegravir-emtricitabine -tenofovir  AF (BIKTARVY ) 50-200-25 MG per tablet 1 tablet        1 tablet Oral Daily 06/05/24 1927     06/06/24 1000  atovaquone  (MEPRON ) 750 MG/5ML suspension 1,500 mg        1,500 mg Oral Daily 06/05/24 1927     06/05/24 1645  vancomycin  (VANCOCIN ) IVPB 1000 mg/200 mL premix        1,000 mg 200 mL/hr over 60 Minutes Intravenous On call to O.R. 06/05/24 1628 06/05/24 2040   06/05/24 1631  vancomycin  (VANCOCIN ) 1-5 GM/200ML-% IVPB       Note to Pharmacy: [name] W: cabinet override      06/05/24 1631 06/06/24 0444       Assessment/Plan: POD 12, s/p drainage of wound infection by Dr. 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 Rogelio (GYN)  -reg diet as able, but continues to have some N/V/D.  No sbo on CT several days ago -Continue wet to dry dressing changes -Off abx for her wound. Wound clean with no evidence of infection -EGD 12/23- probable candidiasis, needs 3 weeks diflucan  per  ID -HH not able to be arranged for patient as her insurance is out of network with all agencies. Pt may possibly be able to discharge to her sister's  home -we will continue to follow her wound  FEN - regular diet as able VTE - Lovenox  ID - diflucan , Biktarvy , Mepron , nystatin   Norovirus   AIDS/FUO: per ID       LOS: 12 days    Beverly JAYSON Ned 06/18/2024

## 2024-06-18 NOTE — Progress Notes (Signed)
 Lab called with critical Hemoglobin 6.7 and lab to recheck to verify.  Andrez, NP made aware and acknowledged.

## 2024-06-18 NOTE — Plan of Care (Signed)
 Id brief note   Doesn't appear afb bcx or urine histoplasma ag collected yet Fever curve improving? Ldh normal Thrombophlebitis noted left antecubital fossa on duplex 12/26    -supportive care for thrombophlebitis -discussed with nursing team again to send urine histo ag and blood afb cx -continue fluconazole /biktarvy /atovaquone . I have stopped nystatin  12/28 as no need on fluconazole  -maintain enteric isolation for norovirus infection -- sx improving

## 2024-06-18 NOTE — Progress Notes (Signed)
" ° ° ° °  Patient Name: Beverly [name]           DOB: Apr 03, 1981  MRN: [id number]      Admission Date: 06/05/2024  Attending Provider: Leesa Kast, DO  Primary Diagnosis: Wound infection after surgery   Level of care: Med-Surg   OVERNIGHT EVENT   HPI/ Events of Note [name], 43 y.o. female, was admitted on 06/05/2024 for Wound infection after surgery.    Normocytic anemia/folic acid  deficiency  Hemoglobin 7.4 -->  6.8. Continues to downtrend No acute changes reported. No melena, hematochezia, or other bleeding reported tonight.  Reports surgical site dry intact, no concerns for bleeding. Folic acid  level noted to be low and is being supplemented.   Plan: Blood transfusion, 1 unit PRBC Recheck H&H after transfusion is completed, transfuse if HGB <7   Lavanda Horns, DNP, ACNPC- AG [facility]  "

## 2024-06-19 ENCOUNTER — Inpatient Hospital Stay (HOSPITAL_COMMUNITY)

## 2024-06-19 DIAGNOSIS — B370 Candidal stomatitis: Secondary | ICD-10-CM | POA: Diagnosis not present

## 2024-06-19 DIAGNOSIS — I809 Phlebitis and thrombophlebitis of unspecified site: Secondary | ICD-10-CM | POA: Diagnosis not present

## 2024-06-19 DIAGNOSIS — T8149XA Infection following a procedure, other surgical site, initial encounter: Secondary | ICD-10-CM | POA: Diagnosis not present

## 2024-06-19 DIAGNOSIS — R197 Diarrhea, unspecified: Secondary | ICD-10-CM | POA: Diagnosis not present

## 2024-06-19 LAB — COMPREHENSIVE METABOLIC PANEL WITH GFR
ALT: <5 U/L (ref 0–44)
AST: 18 U/L (ref 15–41)
Albumin: 2.4 g/dL — ABNORMAL LOW (ref 3.5–5.0)
Alkaline Phosphatase: 138 U/L — ABNORMAL HIGH (ref 38–126)
Anion gap: 12 (ref 5–15)
BUN: 5 mg/dL — ABNORMAL LOW (ref 6–20)
CO2: 19 mmol/L — ABNORMAL LOW (ref 22–32)
Calcium: 8.4 mg/dL — ABNORMAL LOW (ref 8.9–10.3)
Chloride: 104 mmol/L (ref 98–111)
Creatinine, Ser: 0.83 mg/dL (ref 0.44–1.00)
GFR, Estimated: >60 mL/min
Glucose, Bld: 101 mg/dL — ABNORMAL HIGH (ref 70–99)
Potassium: 3.3 mmol/L — ABNORMAL LOW (ref 3.5–5.1)
Sodium: 134 mmol/L — ABNORMAL LOW (ref 135–145)
Total Bilirubin: 0.7 mg/dL (ref 0.0–1.2)
Total Protein: 7.1 g/dL (ref 6.5–8.1)

## 2024-06-19 LAB — CBC
HCT: 26.4 % — ABNORMAL LOW (ref 36.0–46.0)
Hemoglobin: 8.2 g/dL — ABNORMAL LOW (ref 12.0–15.0)
MCH: 25.8 pg — ABNORMAL LOW (ref 26.0–34.0)
MCHC: 31.1 g/dL (ref 30.0–36.0)
MCV: 83 fL (ref 80.0–100.0)
Platelets: 339 K/uL (ref 150–400)
RBC: 3.18 MIL/uL — ABNORMAL LOW (ref 3.87–5.11)
RDW: 17.6 % — ABNORMAL HIGH (ref 11.5–15.5)
WBC: 2.1 K/uL — ABNORMAL LOW (ref 4.0–10.5)
nRBC: 0 % (ref 0.0–0.2)

## 2024-06-19 LAB — TYPE AND SCREEN
ABO/RH(D): A POS
Antibody Screen: NEGATIVE
Unit division: 0

## 2024-06-19 LAB — MAGNESIUM: Magnesium: 1.5 mg/dL — ABNORMAL LOW (ref 1.7–2.4)

## 2024-06-19 LAB — BPAM RBC
Blood Product Expiration Date: 202601182359
ISSUE DATE / TIME: 202512281456
Unit Type and Rh: 6200

## 2024-06-19 LAB — PHOSPHORUS: Phosphorus: 3.4 mg/dL (ref 2.5–4.6)

## 2024-06-19 MED ORDER — SODIUM CHLORIDE 0.9 % IV SOLN: INTRAVENOUS | Status: AC

## 2024-06-19 MED ORDER — AMOXICILLIN-POT CLAVULANATE 875-125 MG PO TABS
1.0000 | ORAL_TABLET | Freq: Two times a day (BID) | ORAL | Status: DC
  Administered 2024-06-19 – 2024-06-20 (×3): 1 via ORAL
  Filled 2024-06-19 (×3): qty 1

## 2024-06-19 MED ORDER — WHITE PETROLATUM EX OINT
TOPICAL_OINTMENT | CUTANEOUS | Status: DC | PRN
  Administered 2024-06-20 (×2): 1 via TOPICAL
  Filled 2024-06-19 (×4): qty 5

## 2024-06-19 MED ORDER — MAGNESIUM SULFATE 4 GM/100ML IV SOLN
4.0000 g | Freq: Once | INTRAVENOUS | Status: AC
  Administered 2024-06-19: 4 g via INTRAVENOUS
  Filled 2024-06-19: qty 100

## 2024-06-19 MED ORDER — IOHEXOL 300 MG/ML  SOLN
75.0000 mL | Freq: Once | INTRAMUSCULAR | Status: AC | PRN
  Administered 2024-06-19: 75 mL via INTRAVENOUS

## 2024-06-19 MED ORDER — POTASSIUM CHLORIDE CRYS ER 20 MEQ PO TBCR
40.0000 meq | EXTENDED_RELEASE_TABLET | Freq: Once | ORAL | Status: AC
  Administered 2024-06-19: 40 meq via ORAL
  Filled 2024-06-19: qty 2

## 2024-06-19 NOTE — Progress Notes (Signed)
 6 Days Post-Op   Subjective/Chief Complaint: sleeping   Objective: Vital signs in last 24 hours: Temp:  [97.8 F (36.6 C)-99 F (37.2 C)] 97.8 F (36.6 C) (12/29 9366) Pulse Rate:  [80-108] 98 (12/29 0633) Resp:  [16-18] 18 (12/29 9366) BP: (102-127)/(60-87) 127/87 (12/29 9366) SpO2:  [98 %-100 %] 100 % (12/29 9366) Last BM Date : 06/18/24  Intake/Output from previous day: 12/28 0701 - 12/29 0700 In: 1966.1 [P.O.:1080; I.V.:886.1] Out: 450 [Urine:250; Emesis/NG output:200] Intake/Output this shift: No intake/output data recorded.  Wound with some exudate and granulating  Lab Results:  Recent Labs    06/17/24 0318 06/18/24 0337 06/18/24 0444 06/18/24 2146  WBC 1.5* 1.7*  --   --   HGB 7.4* 6.7* 6.8* 8.2*  HCT 24.0* 21.8* 21.8* 26.7*  PLT 339 304  --   --    BMET Recent Labs    06/17/24 0318 06/18/24 0337  NA 134* 133*  K 3.9 3.1*  CL 102 103  CO2 23 20*  GLUCOSE 93 88  BUN 10 9  CREATININE 0.86 0.89  CALCIUM  8.5* 8.1*   PT/INR No results for input(s): LABPROT, INR in the last 72 hours. ABG No results for input(s): PHART, HCO3 in the last 72 hours.  Invalid input(s): PCO2, PO2  Studies/Results: No results found.  Anti-infectives: Anti-infectives (From admission, onward)    Start     Dose/Rate Route Frequency Ordered Stop   06/15/24 0000  fluconazole  (DIFLUCAN ) 200 MG tablet        200 mg Oral Daily 06/14/24 1023 07/03/24 2359   06/12/24 1330  fluconazole  (DIFLUCAN ) tablet 200 mg        200 mg Oral Daily 06/12/24 1230 07/03/24 0959   06/06/24 1000  bictegravir-emtricitabine -tenofovir  AF (BIKTARVY ) 50-200-25 MG per tablet 1 tablet        1 tablet Oral Daily 06/05/24 1927     06/06/24 1000  atovaquone  (MEPRON ) 750 MG/5ML suspension 1,500 mg        1,500 mg Oral Daily 06/05/24 1927     06/05/24 1645  vancomycin  (VANCOCIN ) IVPB 1000 mg/200 mL premix        1,000 mg 200 mL/hr over 60 Minutes Intravenous On call to O.R. 06/05/24 1628  06/05/24 2040   06/05/24 1631  vancomycin  (VANCOCIN ) 1-5 GM/200ML-% IVPB       Note to Pharmacy: [name] W: cabinet override      06/05/24 1631 06/06/24 0444       Assessment/Plan: POD 13 s/p drainage of wound infection by Dr. 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 Rogelio (GYN)  -reg diet as abl -Continue wet to dry dressing changes -Off abx for her wound. Wound clean with no evidence of infection, will consider vac placement tomorrow but would need to check if this is covered by insurance -EGD 12/23- probable candidiasis, needs 3 weeks diflucan  per ID -HH not able to be arranged for patient as her insurance is out of network with all agencies. Pt may possibly be able to discharge to her sister's home -we will continue to follow her wound   FEN - regular diet as able VTE - Lovenox  ID - diflucan , Biktarvy , Mepron , nystatin    Norovirus   AIDS/FUO: per ID   [name] 06/19/2024

## 2024-06-19 NOTE — Plan of Care (Signed)
   Problem: Activity: Goal: Risk for activity intolerance will decrease Outcome: Progressing   Problem: Nutrition: Goal: Adequate nutrition will be maintained Outcome: Progressing   Problem: Pain Managment: Goal: General experience of comfort will improve and/or be controlled Outcome: Progressing

## 2024-06-19 NOTE — Progress Notes (Signed)
 " PROGRESS NOTE    Beverly [name]  FMW:[id number] DOB: 1981/06/11 DOA: 06/05/2024 PCP: Melvenia Corean SAILOR, NP  No chief complaint on file.   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  She was discharged home after the procedure.  She then presented to the surgery office on 12/15 with increased pain around her wound and persistent drainage.  She was direct admitted to the hospital.  There was concern for wound infection.  She underwent I&D of the abdominal wound on 12/15.  Wound grew MRSA and Pseudomonas, but because of adequate drainage and clean wound postoperatively she was not continued on antibiotics.  She was given vancomycin  for surgical prophylaxis.  Surgery planned for wound education and discharge home but patient began experiencing nausea, vomiting, dizziness, diarrhea and the hospitalist service was consulted. Patient was found to have norovirus and esophageal candidiasis.  Infectious disease was consulted.  Subjective: No fever overnight.  This morning on exam she is complaining of severe pain in her right upper molars.  She does have fractured teeth in this area.  She reports that she has previously had infections that felt like this.  She reports she is having difficulty eating and talking due to the pain  Objective: Vitals:   06/18/24 1526 06/18/24 1933 06/18/24 2342 06/19/24 0633  BP: 106/82 112/80  127/87  Pulse: 84 (!) 108 90 98  Resp: 16 16 16 18   Temp: 97.9 F (36.6 C) 99 F (37.2 C)  97.8 F (36.6 C)  TempSrc: Oral     SpO2: 98% 99% 99% 100%  Weight:      Height:        Intake/Output Summary (Last 24 hours) at 06/19/2024 1155 Last data filed at 06/19/2024 1108 Gross per 24 hour  Intake 4497.65 ml  Output 450 ml  Net 4047.65 ml   Filed Weights   06/05/24 1625  Weight: 53 kg    Examination: General exam: Appears calm and comfortable, NAD   Respiratory system: No work of breathing, symmetric chest wall expansion ENT: Very tender to palpation along right cheek, unable to appreciate abscess in right upper mouth, poor dentition and multiple fractured teeth. Cardiovascular system: S1 & S2 heard, RRR.  Gastrointestinal system: Abdomen is nondistended, soft does not appear to be exquisitely tender to palpation Neuro: Alert and oriented. No focal neurological deficits.  Assessment & Plan:  Principal Problem:   Wound infection after surgery Active Problems:   Diarrhea   Esophageal candidiasis (HCC)   Thrush   Fever   Thrombophlebitis    Acute diarrhea Norovirus infection - Abdominal pain appears to be secondary to recent surgery - CT: inflammatory changes in perineum, anterior pubic wall, labia, urethral orifice - No fluid collection noted on CT.  Ulceration of the skin of anterior pubic and labial areas noted - General Surgery has reviewed scan and reports these are expected findings considering recent surgical intervention - C. difficile antigen positive but toxin negative - Diarrhea does appear to be resolving albeit very slowly.  1 episode of emesis yesterday. - Continue with IV fluids until diarrhea and vomiting slows - ID is consulted and they feel this is a protracted case of norovirus due to her HIV/AIDS status.  ID maintains no concern for active C. difficile infection  Tooth pain - Patient complaining of new pain in her right upper molars.  She endorses infection in these areas previously.  Multiple fractured teeth - Have ordered CT scan to better evaluate for abscess - Will start Augmentin  x 7 days.  Have also discussed with ID. - Will likely need extraction in the future.  Will need close outpatient follow-up with dentist  Vulvar lesions - Multiple white plaques involving the mons, periclitoral area and right labia majora.  Patient taken to the OR for excisional biopsies with OB/GYN on 12/10. 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 - Hemoglobin dropped to 6.8 on 12/28.  Status post 1 unit PRBC, Hgb is resolved - No overt bleeding noted.  Abdominal exam does not reveal point tenderness.  Patient appears to be moving easily in bed.  Consider repeat CT abdomen if hemoglobin continues to drop  - Iron panel checked in September.  Ferritin 92, iron 63, TIBC 259 TSAT 24 - Folic acid  low.  Continue with supplementation - Trend CBC  Hematochezia -  x1.  Has resolved now.  Hemoglobin continues to downtrend.  If she experiences melena or hematochezia again we will consult gastroenterology for colonoscopy  Fever - Has now been afebrile x 48 hours - Continue to monitor fever curve closely. - Blood cultures remain negative - Concern for new odontogenic abscess today, CT as above.  Augmentin  x 7 days ordered - Urine histoplasma has been sent and pending.  Has multiple day turnaround.  Will need results prior to considering discharge - Acid-fast smear also pending for MAC testing.  Can follow-up in clinic with ID - If fever recurs without clear origins may need to consider bone marrow biopsy  Left upper extremity superficial thrombophlebitis - Superficial thrombophlebitis in left antecubital area.  Continue with warm compress  Esophageal candidiasis - Status post EGD 12/23.  Biopsies negative for malignancy - Diflucan  x 3 weeks  Nausea and vomiting, resolved - No evidence of SBO on CT - Continue to monitor closely  Abdominal wound Squamous of carcinoma abdominal wall - 12/10 excision of abdominal wound which was positive for  squamous cell carcinoma - Postoperative course complicated by wound infection requiring I&D 12/15 - Wound cultures with MRSA and Pseudomonas however surgical team felt antibiotics not necessary and deemed patient had adequate source control in OR - CT scan shows inflammatory changes without fluid collection - General Surgery following wound, continue with wet-to-dry dressings.  Considering wound VAC but unclear if patient's insurance will cover - Continue with regular diet - Reportedly patient cannot receive home health services  Abnormal urinalysis Urine culture with Citrobacter and Klebsiella - ID following reports asymptomatic bacteriuria no need for antibiotics  HIV AIDS Leukopenia, neutropenia, normocytic anemia - CD4 count less than 35 - On Biktarvy  and atovaquone  - ID following  Hypomagnesemia Hyponatremia - Continue supplement as needed  Dizziness Hypotension - On IV fluids with some improvement continue monitor closely   DVT prophylaxis: Lovneox   Code Status: Full Code Disposition: Inpatient pending clinical resolution.  Apparently unable to arrange for home health due to patient insurance being out of network with all agencies.  Hopefully will be able to DC to sister's home eventually.  Consultants:    Procedures:  Drainage of wound infection by Dr. Dasie 12/15  Excision of SCC on abdominal wall by Dr. Curvin 12/10 multiple punch biopsies of the mons area, excision vulva lesions by Dr. Rogelio (GYN)   Antimicrobials:  Anti-infectives (From admission, onward)    Start     Dose/Rate Route Frequency Ordered Stop   06/15/24 0000  fluconazole  (DIFLUCAN ) 200 MG tablet        200 mg Oral Daily 06/14/24 1023 07/03/24 2359   06/12/24 1330  fluconazole  (DIFLUCAN ) tablet 200 mg        200 mg Oral Daily 06/12/24 1230 07/03/24 0959   06/06/24 1000  bictegravir-emtricitabine -tenofovir  AF (BIKTARVY ) 50-200-25 MG per tablet 1 tablet        1 tablet Oral Daily 06/05/24  1927     06/06/24 1000  atovaquone  (MEPRON ) 750 MG/5ML suspension 1,500 mg        1,500 mg Oral Daily 06/05/24 1927     06/05/24 1645  vancomycin  (VANCOCIN ) IVPB 1000 mg/200 mL premix        1,000 mg 200 mL/hr over 60 Minutes Intravenous On call to O.R. 06/05/24 1628 06/05/24 2040   06/05/24 1631  vancomycin  (VANCOCIN ) 1-5 GM/200ML-% IVPB       Note to Pharmacy: [name] W: cabinet override      06/05/24 1631 06/06/24 0444       Data Reviewed: I have personally reviewed following labs and imaging studies CBC: Recent Labs  Lab 06/13/24 0333 06/16/24 0316 06/17/24 0318 06/18/24 0337 06/18/24 0444 06/18/24 2146 06/19/24 0726  WBC 1.7* 1.5* 1.5* 1.7*  --   --  2.1*  NEUTROABS  --   --  0.6* 0.6*  --   --   --   HGB 7.7* 7.9* 7.4* 6.7* 6.8* 8.2* 8.2*  HCT 25.1* 26.0* 24.0* 21.8* 21.8* 26.7* 26.4*  MCV 81.5 81.3 81.4 80.7  --   --  83.0  PLT 378 341 339 304  --   --  339   Basic Metabolic Panel: Recent Labs  Lab 06/13/24 0333 06/16/24 0316 06/17/24 0318 06/18/24 0337 06/19/24 0726  NA 134* 132* 134* 133* 134*  K 3.7 3.8 3.9 3.1* 3.3*  CL 105 98 102 103 104  CO2 20* 23 23 20* 19*  GLUCOSE 93 110* 93 88 101*  BUN 11 8 10 9  5*  CREATININE 0.78 0.89 0.86 0.89 0.83  CALCIUM  8.3* 8.7* 8.5* 8.1* 8.4*  MG 1.7  --   --   --  1.5*  PHOS  --   --   --   --  3.4   GFR: Estimated Creatinine Clearance: 67.3 mL/min (by C-G formula based on SCr of 0.83 mg/dL). Liver Function Tests: Recent Labs  Lab 06/16/24 0316 06/18/24 0337 06/19/24 0726  AST 15 16 18   ALT 6 <5 <5  ALKPHOS 151* 124 138*  BILITOT 0.6 0.4 0.7  PROT 8.1 7.0 7.1  ALBUMIN 2.8* 2.4* 2.4*   CBG: No results for input(s): GLUCAP in the last 168 hours.  Recent Results (from the past 240 hours)  Urine Culture (for pregnant, neutropenic or urologic patients or patients with an indwelling urinary catheter)     Status: Abnormal   Collection Time: 06/11/24  9:50 AM   Specimen: Urine, Clean Catch  Result  Value Ref Range Status   Specimen Description   Final    URINE, CLEAN CATCH Performed at [facility], [address] [place], [place] [address]    Special Requests   Final    NONE Performed at [facility], [address] [place], [place] [address]    Culture (A)  Final    >=100,000 COLONIES/mL CITROBACTER FREUNDII 60,000 COLONIES/mL KLEBSIELLA PNEUMONIAE    Report Status 06/15/2024 FINAL  Final   Organism ID, Bacteria CITROBACTER FREUNDII (A)  Final   Organism ID, Bacteria KLEBSIELLA PNEUMONIAE (A)  Final      Susceptibility   Citrobacter freundii - MIC*    CEFEPIME  <=0.12 SENSITIVE Sensitive     ERTAPENEM <=0.12 SENSITIVE Sensitive     CEFTRIAXONE  0.5 SENSITIVE Sensitive     CIPROFLOXACIN  <=0.06 SENSITIVE Sensitive     GENTAMICIN <=1 SENSITIVE Sensitive     NITROFURANTOIN  <=16 SENSITIVE Sensitive     TRIMETH /SULFA  <=20 SENSITIVE Sensitive     PIP/TAZO Value in next row Sensitive      <=4 SENSITIVEThis is a modified FDA-approved test that has been validated and its performance characteristics determined by the reporting laboratory.  This laboratory is certified under the Clinical Laboratory Improvement Amendments CLIA as qualified to perform high complexity clinical laboratory testing.    MEROPENEM Value in next row Sensitive      <=4 SENSITIVEThis is a modified FDA-approved test that has been validated and its performance characteristics determined by the reporting laboratory.  This laboratory is certified under the Clinical Laboratory Improvement Amendments CLIA as qualified to perform high complexity clinical laboratory testing.    * >=100,000 COLONIES/mL CITROBACTER FREUNDII   Klebsiella pneumoniae - MIC*    AMPICILLIN  Value in next row Resistant      <=4 SENSITIVEThis is a modified FDA-approved test that has been validated and its performance characteristics determined by the reporting laboratory.  This laboratory is certified under the  Clinical Laboratory Improvement Amendments CLIA as qualified to perform high complexity clinical laboratory testing.    CEFAZOLIN  (URINE) Value in next row Sensitive      2 SENSITIVEThis is a modified FDA-approved test that has been validated and its performance characteristics determined by the reporting laboratory.  This laboratory is certified under the Clinical Laboratory Improvement Amendments CLIA as qualified to perform high complexity clinical laboratory testing.    CEFEPIME  Value in next row Sensitive      2 SENSITIVEThis is a modified FDA-approved test that has been validated and its performance characteristics determined by the reporting laboratory.  This laboratory is certified under the Clinical Laboratory Improvement Amendments CLIA as qualified to perform high complexity clinical laboratory testing.    ERTAPENEM Value in next row Sensitive      2 SENSITIVEThis is a modified FDA-approved test that has been validated and its performance characteristics determined by the reporting laboratory.  This laboratory is certified under the Clinical Laboratory Improvement Amendments CLIA as qualified to perform high complexity clinical laboratory testing.    CEFTRIAXONE  Value in next row Sensitive      2 SENSITIVEThis is a modified FDA-approved test that has been validated and its performance characteristics determined by the reporting laboratory.  This laboratory is certified under the Clinical Laboratory Improvement Amendments CLIA as qualified to perform high complexity clinical laboratory testing.    CIPROFLOXACIN  Value in next row Sensitive      2 SENSITIVEThis is a modified FDA-approved test that has been validated and its performance characteristics determined by the reporting laboratory.  This laboratory is certified under the Clinical Laboratory Improvement Amendments CLIA as qualified to perform high complexity clinical laboratory testing.    GENTAMICIN Value in next row Sensitive      2  SENSITIVEThis is a  modified FDA-approved test that has been validated and its performance characteristics determined by the reporting laboratory.  This laboratory is certified under the Clinical Laboratory Improvement Amendments CLIA as qualified to perform high complexity clinical laboratory testing.    NITROFURANTOIN  Value in next row Intermediate      2 SENSITIVEThis is a modified FDA-approved test that has been validated and its performance characteristics determined by the reporting laboratory.  This laboratory is certified under the Clinical Laboratory Improvement Amendments CLIA as qualified to perform high complexity clinical laboratory testing.    TRIMETH /SULFA  Value in next row Sensitive      2 SENSITIVEThis is a modified FDA-approved test that has been validated and its performance characteristics determined by the reporting laboratory.  This laboratory is certified under the Clinical Laboratory Improvement Amendments CLIA as qualified to perform high complexity clinical laboratory testing.    AMPICILLIN /SULBACTAM Value in next row Sensitive      2 SENSITIVEThis is a modified FDA-approved test that has been validated and its performance characteristics determined by the reporting laboratory.  This laboratory is certified under the Clinical Laboratory Improvement Amendments CLIA as qualified to perform high complexity clinical laboratory testing.    PIP/TAZO Value in next row Sensitive      <=4 SENSITIVEThis is a modified FDA-approved test that has been validated and its performance characteristics determined by the reporting laboratory.  This laboratory is certified under the Clinical Laboratory Improvement Amendments CLIA as qualified to perform high complexity clinical laboratory testing.    MEROPENEM Value in next row Sensitive      <=4 SENSITIVEThis is a modified FDA-approved test that has been validated and its performance characteristics determined by the reporting laboratory.  This  laboratory is certified under the Clinical Laboratory Improvement Amendments CLIA as qualified to perform high complexity clinical laboratory testing.    * 60,000 COLONIES/mL KLEBSIELLA PNEUMONIAE  Gastrointestinal Panel by PCR , Stool     Status: Abnormal   Collection Time: 06/12/24  9:52 PM   Specimen: STOOL  Result Value Ref Range Status   Campylobacter species NOT DETECTED NOT DETECTED Final   Plesimonas shigelloides NOT DETECTED NOT DETECTED Final   Salmonella species NOT DETECTED NOT DETECTED Final   Yersinia enterocolitica NOT DETECTED NOT DETECTED Final   Vibrio species NOT DETECTED NOT DETECTED Final   Vibrio cholerae NOT DETECTED NOT DETECTED Final   Enteroaggregative E coli (EAEC) NOT DETECTED NOT DETECTED Final   Enteropathogenic E coli (EPEC) NOT DETECTED NOT DETECTED Final   Enterotoxigenic E coli (ETEC) NOT DETECTED NOT DETECTED Final   Shiga like toxin producing E coli (STEC) NOT DETECTED NOT DETECTED Final   Shigella/Enteroinvasive E coli (EIEC) NOT DETECTED NOT DETECTED Final   Cryptosporidium NOT DETECTED NOT DETECTED Final   Cyclospora cayetanensis NOT DETECTED NOT DETECTED Final   Entamoeba histolytica NOT DETECTED NOT DETECTED Final   Giardia lamblia NOT DETECTED NOT DETECTED Final   Adenovirus F40/41 NOT DETECTED NOT DETECTED Final   Astrovirus NOT DETECTED NOT DETECTED Final   Norovirus GI/GII DETECTED (A) NOT DETECTED Final    Comment: RESULT CALLED TO, READ BACK BY AND VERIFIED WITH: LAMAR NINE RN @2114  06/13/24 ASW    Rotavirus A NOT DETECTED NOT DETECTED Final   Sapovirus (I, II, IV, and V) DETECTED (A) NOT DETECTED Final    Comment: Performed at [facility], [address] [place], [place] [address]  C Difficile Quick Screen (NO PCR Reflex)     Status: Abnormal   Collection  Time: 06/12/24  9:52 PM   Specimen: STOOL  Result Value Ref Range Status   C Diff antigen POSITIVE (A) NEGATIVE Final   C Diff toxin NEGATIVE NEGATIVE Final   C  Diff interpretation   Final    Results are indeterminate. Please contact the provider listed for your campus for C diff questions in AMION.    Comment: Performed at [facility], [address], [place], [place] [address]  Culture, blood (Routine X 2) w Reflex to ID Panel     Status: None (Preliminary result)   Collection Time: 06/16/24 10:34 AM   Specimen: BLOOD RIGHT ARM  Result Value Ref Range Status   Specimen Description   Final    BLOOD RIGHT ARM Performed at [facility], [address] [place], [place] [address]    Special Requests   Final    BOTTLES DRAWN AEROBIC AND ANAEROBIC Blood Culture adequate volume Performed at [facility], [address] [place], [place] [address]    Culture   Final    NO GROWTH 3 DAYS Performed at [facility], [address] [place], [place] [address]    Report Status PENDING  Incomplete  Culture, blood (Routine X 2) w Reflex to ID Panel     Status: None (Preliminary result)   Collection Time: 06/16/24 10:34 AM   Specimen: BLOOD RIGHT HAND  Result Value Ref Range Status   Specimen Description   Final    BLOOD RIGHT HAND Performed at [facility], [address], [place], [place] [address]    Special Requests   Final    BOTTLES DRAWN AEROBIC AND ANAEROBIC Blood Culture adequate volume Performed at [facility], [address] [place], [place] [address]    Culture   Final    NO GROWTH 3 DAYS Performed at [facility], [address], [place], [place] [address]    Report Status PENDING  Incomplete  Culture, blood (Routine X 2) w Reflex to ID Panel     Status: None (Preliminary result)   Collection Time: 06/17/24 11:17 AM   Specimen: BLOOD  Result Value Ref Range Status   Specimen Description   Final    BLOOD BLOOD RIGHT ARM Performed at [facility], [address], [place], [place] [address]    Special Requests   Final    BOTTLES  DRAWN AEROBIC AND ANAEROBIC Blood Culture adequate volume Performed at [facility], [address] [place], [place] [address]    Culture   Final    NO GROWTH 2 DAYS Performed at [facility], [address] [place], [place] [address]    Report Status PENDING  Incomplete  Culture, blood (Routine X 2) w Reflex to ID Panel     Status: None (Preliminary result)   Collection Time: 06/17/24 11:22 AM   Specimen: BLOOD  Result Value Ref Range Status   Specimen Description   Final    BLOOD BLOOD RIGHT ARM Performed at [facility], [address], [place], [place] [address]    Special Requests   Final    BOTTLES DRAWN AEROBIC ONLY Blood Culture results may not be optimal due to an inadequate volume of blood received in culture bottles Performed at [facility], [address] [place], [place] [address]    Culture   Final    NO GROWTH 2 DAYS Performed at [facility], [address], [place], [place] [address]    Report Status PENDING  Incomplete     Radiology Studies: No results found.   Scheduled Meds:  acetaminophen   1,000 mg Oral Q6H   atovaquone   1,500 mg Oral Daily   bictegravir-emtricitabine -tenofovir  AF  1 tablet Oral Daily   enoxaparin  (LOVENOX ) injection  40 mg Subcutaneous Q24H   fluconazole   200 mg Oral Daily   folic acid   1 mg Oral Daily   lamoTRIgine   25 mg Oral q morning   methocarbamol   500 mg Oral QID   pantoprazole   40 mg Oral Daily   pregabalin   25 mg Oral BID   Continuous Infusions:  sodium chloride  75 mL/hr at 06/19/24 1108     LOS: 13 days  MDM: Patient is high risk for one or more organ failure.  They necessitate ongoing hospitalization for continued IV therapies and subsequent lab monitoring. Total time spent interpreting labs and vitals, reviewing the medical record, coordinating care amongst consultants and care team members, directly assessing and discussing care with the patient  and/or family: 55 min  Rupal Childress, DO [facility]  To contact the attending physician between 7A-7P please use Epic Chat. To contact the covering physician during after hours 7P-7A, please review Amion.  06/19/2024, 11:55 AM   *This document has been created with the assistance of dictation software. Please excuse typographical errors. *   "

## 2024-06-20 ENCOUNTER — Telehealth: Payer: Self-pay | Admitting: Pharmacist

## 2024-06-20 DIAGNOSIS — I809 Phlebitis and thrombophlebitis of unspecified site: Secondary | ICD-10-CM | POA: Diagnosis not present

## 2024-06-20 DIAGNOSIS — B370 Candidal stomatitis: Secondary | ICD-10-CM | POA: Diagnosis not present

## 2024-06-20 DIAGNOSIS — T8149XA Infection following a procedure, other surgical site, initial encounter: Secondary | ICD-10-CM | POA: Diagnosis not present

## 2024-06-20 DIAGNOSIS — B20 Human immunodeficiency virus [HIV] disease: Secondary | ICD-10-CM | POA: Diagnosis not present

## 2024-06-20 DIAGNOSIS — R197 Diarrhea, unspecified: Secondary | ICD-10-CM | POA: Diagnosis not present

## 2024-06-20 DIAGNOSIS — R509 Fever, unspecified: Secondary | ICD-10-CM | POA: Diagnosis not present

## 2024-06-20 LAB — CBC
HCT: 24.7 % — ABNORMAL LOW (ref 36.0–46.0)
Hemoglobin: 7.9 g/dL — ABNORMAL LOW (ref 12.0–15.0)
MCH: 26.2 pg (ref 26.0–34.0)
MCHC: 32 g/dL (ref 30.0–36.0)
MCV: 81.8 fL (ref 80.0–100.0)
Platelets: 370 K/uL (ref 150–400)
RBC: 3.02 MIL/uL — ABNORMAL LOW (ref 3.87–5.11)
RDW: 18 % — ABNORMAL HIGH (ref 11.5–15.5)
WBC: 2 K/uL — ABNORMAL LOW (ref 4.0–10.5)
nRBC: 0 % (ref 0.0–0.2)

## 2024-06-20 LAB — COMPREHENSIVE METABOLIC PANEL WITH GFR
ALT: 7 U/L (ref 0–44)
AST: 23 U/L (ref 15–41)
Albumin: 2.4 g/dL — ABNORMAL LOW (ref 3.5–5.0)
Alkaline Phosphatase: 133 U/L — ABNORMAL HIGH (ref 38–126)
Anion gap: 10 (ref 5–15)
BUN: 5 mg/dL — ABNORMAL LOW (ref 6–20)
CO2: 19 mmol/L — ABNORMAL LOW (ref 22–32)
Calcium: 8.5 mg/dL — ABNORMAL LOW (ref 8.9–10.3)
Chloride: 105 mmol/L (ref 98–111)
Creatinine, Ser: 0.77 mg/dL (ref 0.44–1.00)
GFR, Estimated: >60 mL/min
Glucose, Bld: 108 mg/dL — ABNORMAL HIGH (ref 70–99)
Potassium: 3.6 mmol/L (ref 3.5–5.1)
Sodium: 134 mmol/L — ABNORMAL LOW (ref 135–145)
Total Bilirubin: 0.4 mg/dL (ref 0.0–1.2)
Total Protein: 7.1 g/dL (ref 6.5–8.1)

## 2024-06-20 LAB — HISTOPLASMA ANTIGEN, URINE: Histoplasma Antigen, urine: NEGATIVE

## 2024-06-20 LAB — ACID FAST SMEAR (AFB, MYCOBACTERIA): Acid Fast Smear: NEGATIVE

## 2024-06-20 LAB — MAGNESIUM: Magnesium: 2.2 mg/dL (ref 1.7–2.4)

## 2024-06-20 LAB — PHOSPHORUS: Phosphorus: 3.2 mg/dL (ref 2.5–4.6)

## 2024-06-20 MED ORDER — SODIUM CHLORIDE 0.9 % IV SOLN
2.0000 g | Freq: Three times a day (TID) | INTRAVENOUS | Status: AC
  Administered 2024-06-20 – 2024-06-27 (×21): 2 g via INTRAVENOUS
  Filled 2024-06-20 (×22): qty 2

## 2024-06-20 MED ORDER — MAGIC MOUTHWASH W/LIDOCAINE
10.0000 mL | Freq: Four times a day (QID) | ORAL | Status: DC | PRN
  Administered 2024-06-20 – 2024-06-21 (×2): 10 mL via ORAL
  Filled 2024-06-20 (×5): qty 10

## 2024-06-20 MED ORDER — LINEZOLID 600 MG PO TABS
600.0000 mg | ORAL_TABLET | Freq: Two times a day (BID) | ORAL | Status: AC
  Administered 2024-06-20 – 2024-06-27 (×14): 600 mg via ORAL
  Filled 2024-06-20 (×14): qty 1

## 2024-06-20 NOTE — Consult Note (Addendum)
 WOC Nurse Consult Note: Reason for Consult: Requested to apply a NPWT to abdominal surgical wound. Wound type: surgical. Pressure Injury POA: NA Measurement: 3 cm x 15 cm x 4 cm Wound bed: 80% red, 20% dark yellow slough Drainage (amount, consistency, odor) dark yellow drainage, stayed the odor after cleaning. Periwound: intact, close to pubis. Dressing procedure/placement/frequency: Removed old NPWT dressing Cleansed wound with normal saline Periwound skin protected with skin barrier wipe. Haft ring at 3 and 9 o'clock position. Filled wound with 1 piece of black foam  Sealed NPWT dressing at HG Patient received IV pain medication per bedside nurse prior to dressing change Patient tolerated procedure well.    WOC nurse will continue to provide NPWT dressing changed TUE/FRI due to the complexity of the dressing change.   WOC team will follow FRI. Please reconsult if further assistance is needed. Thank-you,  Lela Holm MSN, RN, CWCN, CNS.  (Phone [phone])

## 2024-06-20 NOTE — Progress Notes (Signed)
 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, complicated by abscess/infection s/p I&D 12/15, now with nonspecific dysphagia/diarrhea      #hiv/aids #leukopenia #thrush #diarrhea Hx noncompliance Restarted biktarvy  04/06/24 and per patient has taken daily Lab Results  Component Value Date   HIV1RNAQUANT 30 06/13/2024   Lab Results  Component Value Date   CD4TCELL 1 (L) 05/11/2024   CD4TABS <35 (L) 05/11/2024    Was on bactrim  pjp prophy previously but switched to atovaquone  due to concern for leukopenia 12/23 Egd suggestive of esophageal candida; no ulceration/mass/lesion seen otherwise    #sepsis Diarrhea 12/19 onset Fever 12/25 onset Unclear if related to norovirus gi infection or some other complication of aids A little far out for IRIS -- this is more of a semantic when iris is mentioned, as causes of sepsis will need to be worked up  Has had off/on fever in setting left antecubital thrombophlebitis, 12/29 onset right upper molar tooth pain (no periapical abscess on ct), and abd surgical site purulence.  Other consideration given for disseminated MAC (borderline leukopenia; no lft elevation; 12/21 ct abd pelv/chest no lymphadenopathy --- but should still consider). Disseminated histo considered too but as in mac not much signs   -06/20/24 will plan to treat the abd wound -- this cover if dental related infection present -we'll f/u the afb cx; urine histo ag in process    #thrombophlebitis 12/27 noticed left antecubital fossa cord like structure tender to touch No progression   #assymptomatic bacteriuria At some point she had reported to dr Verdene dysuria thus ucx 12/21 tested  (citrobacter freundii and kleb pna -- both senstive to bactrim ) However when I saw her 12/22 denies sx of uti 12/27 continues to lack sx of uti. I clarify with her what the burning reported previously and it seems she described the urine after it has come out and came in contact with the raw skin around the perineal area.     #hx 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/3 positive margin, repeated excision 05/31/24 for surgical site infection of the scc excision, s/p I&D by surgery 12/17 -presumed source control no abx post-op (mrsa, rare PsA -- r cipro , and gropu b strep) -12/20 wound pic appears healthy tissue  -observe and will see if recurrent infection will give abx -06/20/24 purulence; new fever    Plan: -Continue biktarvy /atovaquone   -Continue fluconazole  3 weeks until 07/03/24 -id team will f/u afb bcx and urine histo ag -start linezolid  and ceftazidime  7 day for the abd wound infection; will cover the dental carries infection as well. No oral options for pseudomonas so anticipate inpatient tx -enteric precaution per infection prevention protocol -she currently has 1/14 id clinic appointment but will see if need to delay if still inhouse -discussed with primary team    Microbiology:   Antibiotics: Outpatient Biktarvy  atovaqoune 12/22-c fluconazole  plan 3 weeks 12/30-c linezolid  plan 7 days 12/30-c ceftazidime  plan 7 days  Cultures: Blood -- 12/27 ngtd 12/26 ngtd   Urine -- 12/21 >100k citrobacter freundii; 60k kleb pna -- both sensitive to bactrim   Other --   SUBJECTIVE: Right sided upper tooth pain  Ct maxillofacial no periapical abscess Fever again Abd wound with purulence  N/v/d much better    Review of Systems: Review of Systems  All other systems reviewed and are negative.    Scheduled Meds:  acetaminophen   1,000 mg Oral Q6H   atovaquone   1,500 mg Oral Daily   bictegravir-emtricitabine -tenofovir  AF  1 tablet Oral Daily   enoxaparin  (LOVENOX )  injection  40 mg Subcutaneous Q24H   fluconazole   200 mg Oral Daily   folic acid   1 mg Oral Daily   lamoTRIgine   25 mg Oral q morning   linezolid   600 mg Oral Q12H   methocarbamol   500 mg Oral QID   pantoprazole   40 mg Oral Daily   pregabalin   25 mg Oral BID   Continuous Infusions:  cefTAZidime  (FORTAZ )  IV     PRN Meds:.acetaminophen , acetaminophen , albuterol , alum & mag hydroxide-simeth, diphenhydrAMINE  **OR** diphenhydrAMINE , HYDROmorphone  (DILAUDID ) injection, loperamide , LORazepam , LORazepam , magic mouthwash w/lidocaine , melatonin, menthol , methocarbamol  (ROBAXIN ) injection, naphazoline-glycerin , ondansetron  (ZOFRAN ) IV, ondansetron  **OR** [DISCONTINUED] ondansetron  (ZOFRAN ) IV, oxyCODONE , phenol, prochlorperazine , sodium chloride , white petrolatum  Allergies[1]  OBJECTIVE: Vitals:   06/19/24 2324 06/20/24 0231 06/20/24 0739 06/20/24 1030  BP: 95/72 101/76 113/73 130/88  Pulse: 87 93 (!) 102 87  Resp: 18 19 18 18   Temp: 98.3 F (36.8 C) (!) 97.3 F (36.3 C) 97.6 F (36.4 C) 97.8 F (36.6 C)  TempSrc: Oral     SpO2: 100% 100% 100% 100%  Weight:      Height:       Body mass index is 22.08 kg/m.  Exam: General/constitutional: no distress, pleasant HEENT: Normocephalic, PER, Conj Clear, EOMI, Oropharynx clear Neck supple CV: rrr no mrg Lungs: clear to auscultation, normal respiratory effort Abd: Soft, Nontender -- dressing c/d/i Ext: no edema Skin: No Rash  Neuro: nonfocal MSK: no peripheral joint swelling/tenderness/warmth; back spines nontender     Lab Results Lab Results  Component Value Date   WBC 2.0 (L) 06/20/2024   HGB 7.9 (L) 06/20/2024   HCT 24.7 (L) 06/20/2024   MCV 81.8 06/20/2024   PLT 370 06/20/2024    Lab Results  Component Value Date   CREATININE 0.77 06/20/2024   BUN 5 (L) 06/20/2024   NA 134 (L) 06/20/2024   K 3.6 06/20/2024   CL 105 06/20/2024   CO2 19 (L) 06/20/2024    Lab Results  Component Value Date   ALT 7 06/20/2024   AST  23 06/20/2024   ALKPHOS 133 (H) 06/20/2024   BILITOT 0.4 06/20/2024     Imaging: Reviewed  12/29 ct maxillofacial 1. Dental caries of a right mandibular molar with associated periapical lucency, without adjacent soft tissue abscess. 2. Additional dental caries of the left mandibular third molar. 3. Small focus of stranding in the left facial soft tissues overlying the mandible, concerning for cellulitis. No abscess.   Constance ONEIDA Passer, MD [facility] 06/20/2024, 3:37 PM Evaluation of this patient requires complex antimicrobial therapy evaluation and counseling + isolation needs for disease transmission risk assessment and mitigation        [1]  Allergies Allergen Reactions   Bee Venom Anaphylaxis, Shortness Of Breath and Swelling   Morphine  Hives, Itching and Other (See Comments)    And- Tolerates hydrocodone , oxycodone , and codeine with no issues   Ferrlecit  [Na Ferric Gluc Cplx In Sucrose] Swelling and Other (See Comments)    Swelling and puffiness in hands and legs after Ferrlecit  250 mg infused over  60 minutes (possibly infusion related reaction)   Latex Hives   "

## 2024-06-20 NOTE — Progress Notes (Signed)
 " PROGRESS NOTE    Beverly [name]  FMW:[id number] DOB: 04-13-1981 DOA: 06/05/2024 PCP: Melvenia Corean SAILOR, NP  No chief complaint on file.   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  She was discharged home after the procedure.  She then presented to the surgery office on 12/15 with increased pain around her wound and persistent drainage.  She was direct admitted to the hospital.  There was concern for wound infection.  She underwent I&D of the abdominal wound on 12/15.  Wound grew MRSA and Pseudomonas, but because of adequate drainage and clean wound postoperatively she was not continued on antibiotics.  She was given vancomycin  for surgical prophylaxis.  Surgery planned for wound education and discharge home but patient began experiencing nausea, vomiting, dizziness, diarrhea and the hospitalist service was consulted. Patient was found to have norovirus and esophageal candidiasis.  Infectious disease was consulted. Hospital course has been further prolonged due to persistent fever.  Subjective: Fever 103.1 overnight On evaluation this morning patient reports that her mouth and cheek are still in severe pain.  She reports that she is unable to eat or speak because of it. Bedside RN reports that her abdominal wound was malodorous with some discharge this morning.  Objective: Vitals:   06/19/24 2324 06/20/24 0231 06/20/24 0739 06/20/24 1030  BP: 95/72 101/76 113/73 130/88  Pulse: 87 93 (!) 102 87  Resp: 18 19 18 18   Temp: 98.3 F (36.8 C) (!) 97.3 F (36.3 C) 97.6 F (36.4 C) 97.8 F (36.6 C)  TempSrc: Oral     SpO2: 100% 100% 100% 100%  Weight:      Height:        Intake/Output Summary (Last 24 hours) at 06/20/2024 1349 Last data filed at 06/20/2024 0600 Gross per 24 hour  Intake 912.77 ml  Output 900 ml  Net 12.77 ml   Filed Weights   06/05/24 1625  Weight: 53 kg     Examination: General exam: Appears calm and comfortable, NAD  Respiratory system: No work of breathing, symmetric chest wall expansion ENT: Multiple fractured teeth, no appreciable abscess.  Does have buccal mucousal laceration near back bottom right molars, no active bleeding or oozing appreciated. Cardiovascular system: S1 & S2 heard, RRR.  Gastrointestinal system: Abdomen is nondistended, soft does not appear to be exquisitely tender to palpation Neuro: Alert and oriented. No focal neurological deficits.  Assessment & Plan:  Principal Problem:   Wound infection after surgery Active Problems:   Diarrhea   Esophageal candidiasis (HCC)   Thrush   Fever   Thrombophlebitis    Acute diarrhea Norovirus infection - Abdominal pain appears to be secondary to recent surgery - CT: inflammatory changes in perineum, anterior pubic wall, labia, urethral orifice - No fluid collection noted on CT.  Ulceration of the skin of anterior pubic and labial areas noted - General Surgery has reviewed scan and reports these are expected findings considering recent surgical intervention - C. difficile antigen positive but toxin negative - Diarrhea does appear to be resolving albeit very slowly.  Occasional episode of emesis. - Continue with IV fluids until diarrhea and vomiting slows - ID is consulted and they feel this is a protracted case of norovirus due to her HIV/AIDS status.  ID maintains no concern for active C. difficile infection  Tooth pain - Patient complaining of pain in her right molars and pain along  the cheek.  CT scan negative for abscess - Restarted on Augmentin  12/29.  She reports the pain is becoming more severe - Does have small laceration on the inside of her cheek does not appear acutely infected but has multiple fractured teeth and is certainly high risk of developing an abscess - Will need close outpatient follow-up with dentist for likely extraction in the future - Currently  on Augmentin . Continue Magic mouthwash with lidocaine   Fever - Intermittent fever.  Was afebrile x 48 hours but has new fever overnight of 103.1 - Blood cultures remain negative - There was concern for odontogenic abscess but CT is negative and she has been on Augmentin  - Urine histoplasma has been sent and pending - ID has concern for disseminated MAC.  Acid-fast still pending - Bedside RN endorses abdominal wound is malodorous with some discharge today.  Will start linezolid  and ceftazidime  x1 week. 12/15 wound culture results are positive for MRSA and Pseudomonas  - If fever recurs without clear origins may need to consider bone marrow biopsy  Vulvar lesions - Multiple white plaques involving the mons, periclitoral area and right labia majora.  Patient taken to the OR for excisional biopsies with OB/GYN on 12/10. 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 - Hemoglobin dropped to 6.8 on 12/28.  Status post 1 unit PRBC, Hgb is resolved - No overt bleeding noted.  Abdominal exam does not reveal point tenderness.  Patient appears to be moving easily in bed.  Consider repeat CT abdomen if hemoglobin continues to drop  - Iron panel checked in September.  Ferritin 92, iron 63, TIBC 259 TSAT 24 - Folic acid  low.  Continue with supplementation - Trend CBC  Hematochezia -  x1.  Has resolved now.  Hemoglobin continues to downtrend.  If she experiences melena or hematochezia again we will consult gastroenterology for colonoscopy  Left upper extremity superficial thrombophlebitis - Superficial thrombophlebitis in left antecubital  area.  Continue with warm compress  Esophageal candidiasis - Status post EGD 12/23.  Biopsies negative for malignancy - Diflucan  x 3 weeks  Nausea and vomiting, resolved - No evidence of SBO on CT - Continue to monitor closely  Abdominal wound Squamous of carcinoma abdominal wall - 12/10 excision of abdominal wound which was positive for squamous cell carcinoma - Postoperative course complicated by wound infection requiring I&D 12/15 - Wound cultures with MRSA and Pseudomonas however surgical team felt antibiotics not necessary and deemed patient had adequate source control in OR - CT scan shows inflammatory changes without fluid collection - General Surgery following wound, patient does not have access to home health per TOC.  Have been doing wet-to-dry dressings.  Surgery applying wound VAC today for at least to remained of the time patient is in the hospital.  She does not have outpatient wound VAC options per TOC - Given changes to wound appearance today we will also start antibiotics for MRSA/Pseudomonas coverage x1 week  Abnormal urinalysis Urine culture with Citrobacter and Klebsiella - ID following reports asymptomatic bacteriuria no need for antibiotics  HIV AIDS Leukopenia, neutropenia, normocytic anemia - CD4 count less than 35 - On Biktarvy  and atovaquone  -  ID following  Hypomagnesemia Hyponatremia - Continue supplement as needed  Dizziness Hypotension - On IV fluids with some improvement continue monitor closely   DVT prophylaxis: Lovneox   Code Status: Full Code Disposition: Inpatient pending clinical resolution.  Apparently unable to arrange for home health due to patient insurance being out of network with all agencies.  Hopefully will be able to DC to sister's home eventually.  Consultants:    Procedures:  Drainage of wound infection by Dr. Dasie 12/15  Excision of SCC on abdominal wall by Dr. Curvin 12/10 multiple punch biopsies of the mons area,  excision vulva lesions by Dr. Rogelio (GYN)   Antimicrobials:  Anti-infectives (From admission, onward)    Start     Dose/Rate Route Frequency Ordered Stop   06/19/24 1300  amoxicillin -clavulanate (AUGMENTIN ) 875-125 MG per tablet 1 tablet        1 tablet Oral Every 12 hours 06/19/24 1200 06/26/24 0959   06/15/24 0000  fluconazole  (DIFLUCAN ) 200 MG tablet        200 mg Oral Daily 06/14/24 1023 07/03/24 2359   06/12/24 1330  fluconazole  (DIFLUCAN ) tablet 200 mg        200 mg Oral Daily 06/12/24 1230 07/03/24 0959   06/06/24 1000  bictegravir-emtricitabine -tenofovir  AF (BIKTARVY ) 50-200-25 MG per tablet 1 tablet        1 tablet Oral Daily 06/05/24 1927     06/06/24 1000  atovaquone  (MEPRON ) 750 MG/5ML suspension 1,500 mg        1,500 mg Oral Daily 06/05/24 1927     06/05/24 1645  vancomycin  (VANCOCIN ) IVPB 1000 mg/200 mL premix        1,000 mg 200 mL/hr over 60 Minutes Intravenous On call to O.R. 06/05/24 1628 06/05/24 2040   06/05/24 1631  vancomycin  (VANCOCIN ) 1-5 GM/200ML-% IVPB       Note to Pharmacy: Beverly [name] W: cabinet override      06/05/24 1631 06/06/24 0444       Data Reviewed: I have personally reviewed following labs and imaging studies CBC: Recent Labs  Lab 06/16/24 0316 06/17/24 0318 06/18/24 0337 06/18/24 0444 06/18/24 2146 06/19/24 0726 06/20/24 0322  WBC 1.5* 1.5* 1.7*  --   --  2.1* 2.0*  NEUTROABS  --  0.6* 0.6*  --   --   --   --   HGB 7.9* 7.4* 6.7* 6.8* 8.2* 8.2* 7.9*  HCT 26.0* 24.0* 21.8* 21.8* 26.7* 26.4* 24.7*  MCV 81.3 81.4 80.7  --   --  83.0 81.8  PLT 341 339 304  --   --  339 370   Basic Metabolic Panel: Recent Labs  Lab 06/16/24 0316 06/17/24 0318 06/18/24 0337 06/19/24 0726 06/20/24 0322  NA 132* 134* 133* 134* 134*  K 3.8 3.9 3.1* 3.3* 3.6  CL 98 102 103 104 105  CO2 23 23 20* 19* 19*  GLUCOSE 110* 93 88 101* 108*  BUN 8 10 9  5* 5*  CREATININE 0.89 0.86 0.89 0.83 0.77  CALCIUM  8.7* 8.5* 8.1* 8.4* 8.5*  MG  --   --    --  1.5* 2.2  PHOS  --   --   --  3.4 3.2   GFR: Estimated Creatinine Clearance: 69.8 mL/min (by C-G formula based on SCr of 0.77 mg/dL). Liver Function Tests: Recent Labs  Lab 06/16/24 0316 06/18/24 0337 06/19/24 0726 06/20/24 0322  AST 15 16 18 23   ALT 6 <5 <5 7  ALKPHOS 151* 124 138* 133*  BILITOT 0.6 0.4 0.7 0.4  PROT 8.1 7.0 7.1 7.1  ALBUMIN 2.8* 2.4* 2.4* 2.4*   CBG: No results for input(s): GLUCAP in the last 168 hours.  Recent Results (from the past 240 hours)  Urine Culture (for pregnant, neutropenic or urologic patients or patients with an indwelling urinary catheter)     Status: Abnormal   Collection Time: 06/11/24  9:50 AM   Specimen: Urine, Clean Catch  Result Value Ref Range Status   Specimen Description   Final    URINE, CLEAN CATCH Performed at [facility], [address] [place], [place] [address]    Special Requests   Final    NONE Performed at [facility], [address] [place], [place] [address]    Culture (A)  Final    >=100,000 COLONIES/mL CITROBACTER FREUNDII 60,000 COLONIES/mL KLEBSIELLA PNEUMONIAE    Report Status 06/15/2024 FINAL  Final   Organism ID, Bacteria CITROBACTER FREUNDII (A)  Final   Organism ID, Bacteria KLEBSIELLA PNEUMONIAE (A)  Final      Susceptibility   Citrobacter freundii - MIC*    CEFEPIME  <=0.12 SENSITIVE Sensitive     ERTAPENEM <=0.12 SENSITIVE Sensitive     CEFTRIAXONE  0.5 SENSITIVE Sensitive     CIPROFLOXACIN  <=0.06 SENSITIVE Sensitive     GENTAMICIN <=1 SENSITIVE Sensitive     NITROFURANTOIN  <=16 SENSITIVE Sensitive     TRIMETH /SULFA  <=20 SENSITIVE Sensitive     PIP/TAZO Value in next row Sensitive      <=4 SENSITIVEThis is a modified FDA-approved test that has been validated and its performance characteristics determined by the reporting laboratory.  This laboratory is certified under the Clinical Laboratory Improvement Amendments CLIA as qualified to perform high  complexity clinical laboratory testing.    MEROPENEM Value in next row Sensitive      <=4 SENSITIVEThis is a modified FDA-approved test that has been validated and its performance characteristics determined by the reporting laboratory.  This laboratory is certified under the Clinical Laboratory Improvement Amendments CLIA as qualified to perform high complexity clinical laboratory testing.    * >=100,000 COLONIES/mL CITROBACTER FREUNDII   Klebsiella pneumoniae - MIC*    AMPICILLIN  Value in next row Resistant      <=4 SENSITIVEThis is a modified FDA-approved test that has been validated and its performance characteristics determined by the reporting laboratory.  This laboratory is certified under the Clinical Laboratory Improvement Amendments CLIA as qualified to perform high complexity clinical laboratory testing.    CEFAZOLIN  (URINE) Value in next row Sensitive      2 SENSITIVEThis is a modified FDA-approved test that has been validated and its performance characteristics determined by the reporting laboratory.  This laboratory is certified under the Clinical Laboratory Improvement Amendments CLIA as qualified to perform high complexity clinical laboratory testing.    CEFEPIME  Value in next row Sensitive      2 SENSITIVEThis is a modified FDA-approved test that has been validated and its performance characteristics determined by the reporting laboratory.  This laboratory is certified under the Clinical Laboratory Improvement Amendments CLIA as qualified to perform high complexity clinical laboratory testing.    ERTAPENEM Value in next row Sensitive      2 SENSITIVEThis is a modified FDA-approved test that has been validated and its performance characteristics determined by the reporting laboratory.  This laboratory is certified under the Clinical Laboratory Improvement Amendments CLIA as qualified to perform high complexity clinical laboratory testing.    CEFTRIAXONE  Value in next row  Sensitive      2  SENSITIVEThis is a modified FDA-approved test that has been validated and its performance characteristics determined by the reporting laboratory.  This laboratory is certified under the Clinical Laboratory Improvement Amendments CLIA as qualified to perform high complexity clinical laboratory testing.    CIPROFLOXACIN  Value in next row Sensitive      2 SENSITIVEThis is a modified FDA-approved test that has been validated and its performance characteristics determined by the reporting laboratory.  This laboratory is certified under the Clinical Laboratory Improvement Amendments CLIA as qualified to perform high complexity clinical laboratory testing.    GENTAMICIN Value in next row Sensitive      2 SENSITIVEThis is a modified FDA-approved test that has been validated and its performance characteristics determined by the reporting laboratory.  This laboratory is certified under the Clinical Laboratory Improvement Amendments CLIA as qualified to perform high complexity clinical laboratory testing.    NITROFURANTOIN  Value in next row Intermediate      2 SENSITIVEThis is a modified FDA-approved test that has been validated and its performance characteristics determined by the reporting laboratory.  This laboratory is certified under the Clinical Laboratory Improvement Amendments CLIA as qualified to perform high complexity clinical laboratory testing.    TRIMETH /SULFA  Value in next row Sensitive      2 SENSITIVEThis is a modified FDA-approved test that has been validated and its performance characteristics determined by the reporting laboratory.  This laboratory is certified under the Clinical Laboratory Improvement Amendments CLIA as qualified to perform high complexity clinical laboratory testing.    AMPICILLIN /SULBACTAM Value in next row Sensitive      2 SENSITIVEThis is a modified FDA-approved test that has been validated and its performance characteristics determined by the reporting laboratory.  This  laboratory is certified under the Clinical Laboratory Improvement Amendments CLIA as qualified to perform high complexity clinical laboratory testing.    PIP/TAZO Value in next row Sensitive      <=4 SENSITIVEThis is a modified FDA-approved test that has been validated and its performance characteristics determined by the reporting laboratory.  This laboratory is certified under the Clinical Laboratory Improvement Amendments CLIA as qualified to perform high complexity clinical laboratory testing.    MEROPENEM Value in next row Sensitive      <=4 SENSITIVEThis is a modified FDA-approved test that has been validated and its performance characteristics determined by the reporting laboratory.  This laboratory is certified under the Clinical Laboratory Improvement Amendments CLIA as qualified to perform high complexity clinical laboratory testing.    * 60,000 COLONIES/mL KLEBSIELLA PNEUMONIAE  Gastrointestinal Panel by PCR , Stool     Status: Abnormal   Collection Time: 06/12/24  9:52 PM   Specimen: STOOL  Result Value Ref Range Status   Campylobacter species NOT DETECTED NOT DETECTED Final   Plesimonas shigelloides NOT DETECTED NOT DETECTED Final   Salmonella species NOT DETECTED NOT DETECTED Final   Yersinia enterocolitica NOT DETECTED NOT DETECTED Final   Vibrio species NOT DETECTED NOT DETECTED Final   Vibrio cholerae NOT DETECTED NOT DETECTED Final   Enteroaggregative E coli (EAEC) NOT DETECTED NOT DETECTED Final   Enteropathogenic E coli (EPEC) NOT DETECTED NOT DETECTED Final   Enterotoxigenic E coli (ETEC) NOT DETECTED NOT DETECTED Final   Shiga like toxin producing E coli (STEC) NOT DETECTED NOT DETECTED Final   Shigella/Enteroinvasive E coli (EIEC) NOT DETECTED NOT DETECTED Final   Cryptosporidium NOT DETECTED NOT DETECTED Final   Cyclospora cayetanensis NOT DETECTED NOT  DETECTED Final   Entamoeba histolytica NOT DETECTED NOT DETECTED Final   Giardia lamblia NOT DETECTED NOT DETECTED  Final   Adenovirus F40/41 NOT DETECTED NOT DETECTED Final   Astrovirus NOT DETECTED NOT DETECTED Final   Norovirus GI/GII DETECTED (A) NOT DETECTED Final    Comment: RESULT CALLED TO, READ BACK BY AND VERIFIED WITH: LAMAR NINE RN @2114  06/13/24 ASW    Rotavirus A NOT DETECTED NOT DETECTED Final   Sapovirus (I, II, IV, and V) DETECTED (A) NOT DETECTED Final    Comment: Performed at [facility], [address] [place], [place] [address]  C Difficile Quick Screen (NO PCR Reflex)     Status: Abnormal   Collection Time: 06/12/24  9:52 PM   Specimen: STOOL  Result Value Ref Range Status   C Diff antigen POSITIVE (A) NEGATIVE Final   C Diff toxin NEGATIVE NEGATIVE Final   C Diff interpretation   Final    Results are indeterminate. Please contact the provider listed for your campus for C diff questions in AMION.    Comment: Performed at [facility], [address], [place], [place] [address]  Culture, blood (Routine X 2) w Reflex to ID Panel     Status: None (Preliminary result)   Collection Time: 06/16/24 10:34 AM   Specimen: BLOOD RIGHT ARM  Result Value Ref Range Status   Specimen Description   Final    BLOOD RIGHT ARM Performed at [facility], [address] [place], [place] [address]    Special Requests   Final    BOTTLES DRAWN AEROBIC AND ANAEROBIC Blood Culture adequate volume Performed at [facility], [address] [place], [place] [address]    Culture   Final    NO GROWTH 4 DAYS Performed at [facility], [address] [place], [place] [address]    Report Status PENDING  Incomplete  Culture, blood (Routine X 2) w Reflex to ID Panel     Status: None (Preliminary result)   Collection Time: 06/16/24 10:34 AM   Specimen: BLOOD RIGHT HAND  Result Value Ref Range Status   Specimen Description   Final    BLOOD RIGHT HAND Performed at [facility], [address], [place], [place] [address]     Special Requests   Final    BOTTLES DRAWN AEROBIC AND ANAEROBIC Blood Culture adequate volume Performed at [facility], [address], [place], [place] [address]    Culture   Final    NO GROWTH 4 DAYS Performed at [facility], [address] [place], [place] [address]    Report Status PENDING  Incomplete  Culture, blood (Routine X 2) w Reflex to ID Panel     Status: None (Preliminary result)   Collection Time: 06/17/24 11:17 AM   Specimen: BLOOD  Result Value Ref Range Status   Specimen Description   Final    BLOOD BLOOD RIGHT ARM Performed at [facility], [address] [place], [place] [address]    Special Requests   Final    BOTTLES DRAWN AEROBIC AND ANAEROBIC Blood Culture adequate volume Performed at [facility], [address] [place], [place] [address]    Culture   Final    NO GROWTH 3 DAYS Performed at [facility], [address] [place], [place] [address]    Report Status PENDING  Incomplete  Culture, blood (Routine X  2) w Reflex to ID Panel     Status: None (Preliminary result)   Collection Time: 06/17/24 11:22 AM   Specimen: BLOOD  Result Value Ref Range Status   Specimen Description   Final    BLOOD BLOOD RIGHT ARM Performed at [facility], [address], [place], [place] [address]    Special Requests   Final    BOTTLES DRAWN AEROBIC ONLY Blood Culture results may not be optimal due to an inadequate volume of blood received in culture bottles Performed at [facility], [address], [place], [place] [address]    Culture   Final    NO GROWTH 3 DAYS Performed at [facility], [address] [place], [place] [address]    Report Status PENDING  Incomplete     Radiology Studies: CT MAXILLOFACIAL W CONTRAST Result Date: 06/19/2024 EXAM: CT Face with contrast 06/19/2024 02:07:40 PM TECHNIQUE: CT of the face was performed with the  administration of 75 mL of iohexol  (OMNIPAQUE ) 300 MG/ML solution. Multiplanar reformatted images are provided for review. Automated exposure control, iterative reconstruction, and/or weight based adjustment of the mA/kV was utilized to reduce the radiation dose to as low as reasonably achievable. COMPARISON: None available CLINICAL HISTORY: Concern for abscess in the right upper molars. Pt has AIDS, possibility of atypical infection. FINDINGS: AERODIGESTIVE TRACT: No mass. No edema. SALIVARY GLANDS: No acute abnormality. LYMPH NODES: No suspicious cervical lymphadenopathy. SOFT TISSUES: There is no significant soft tissue swelling along the right aspect of the mandible to suggest soft tissue abscess. There is a small focus of stranding in the left facial soft tissues overlying the mandible seen on series 2 image 30 concerning for cellulitis. No evidence of abscess. BRAIN, ORBITS AND SINUSES: Mucosal thickening in the left maxillary sinus. Minimal mucosal thickening in the left sphenoid sinus. Leftward deviation of the nasal septum. BONES: Dental caries of a right mandibular molar as well as a periapical lucency of this tooth. Additional dental caries of the left mandibular third molar. No acute maxillofacial fracture. No suspicious bone lesion. IMPRESSION: 1. Dental caries of a right mandibular molar with associated periapical lucency, without adjacent soft tissue abscess. 2. Additional dental caries of the left mandibular third molar. 3. Small focus of stranding in the left facial soft tissues overlying the mandible, concerning for cellulitis. No abscess. Electronically signed by: Donnice Mania MD 06/19/2024 02:45 PM EST RP Workstation: [id number]     Scheduled Meds:  acetaminophen   1,000 mg Oral Q6H   amoxicillin -clavulanate  1 tablet Oral Q12H   atovaquone   1,500 mg Oral Daily   bictegravir-emtricitabine -tenofovir  AF  1 tablet Oral Daily   enoxaparin  (LOVENOX ) injection  40 mg Subcutaneous Q24H    fluconazole   200 mg Oral Daily   folic acid   1 mg Oral Daily   lamoTRIgine   25 mg Oral q morning   methocarbamol   500 mg Oral QID   pantoprazole   40 mg Oral Daily   pregabalin   25 mg Oral BID   Continuous Infusions:     LOS: 14 days  MDM: Patient is high risk for one or more organ failure.  They necessitate ongoing hospitalization for continued IV therapies and subsequent lab monitoring. Total time spent interpreting labs and vitals, reviewing the medical record, coordinating care amongst consultants and care team members, directly assessing and discussing care with the patient and/or family: 55 min  Beverly Brundage, DO [facility]  To contact the attending physician between 7A-7P please  use Epic Chat. To contact the covering physician during after hours 7P-7A, please review Amion.  06/20/2024, 1:49 PM   *This document has been created with the assistance of dictation software. Please excuse typographical errors. *   "

## 2024-06-20 NOTE — Plan of Care (Signed)
   Problem: Activity: Goal: Risk for activity intolerance will decrease Outcome: Progressing   Problem: Pain Managment: Goal: General experience of comfort will improve and/or be controlled Outcome: Progressing   Problem: Safety: Goal: Ability to remain free from injury will improve Outcome: Progressing

## 2024-06-20 NOTE — Telephone Encounter (Signed)
 [facility] Archive results from 05/11/24 reveal no drug resistance mutations; all agents remain susceptible. Will add to media tab. Viral load indicates lack of consistent adherence due to multiple health concerns at this time.  Alan Geralds, PharmD, CPP, BCIDP, AAHIVP Clinical Pharmacist Practitioner Infectious Diseases Clinical Pharmacist [facility]

## 2024-06-20 NOTE — Progress Notes (Signed)
 7 Days Post-Op   Subjective/Chief Complaint: Tooth pain   Objective: Vital signs in last 24 hours: Temp:  [97.3 F (36.3 C)-103.1 F (39.5 C)] 97.6 F (36.4 C) (12/30 0739) Pulse Rate:  [87-117] 102 (12/30 0739) Resp:  [18-20] 18 (12/30 0739) BP: (95-132)/(66-80) 113/73 (12/30 0739) SpO2:  [100 %] 100 % (12/30 0739) Last BM Date : 06/19/24  Intake/Output from previous day: 12/29 0701 - 12/30 0700 In: 3684.3 [P.O.:720; I.V.:576.3; Blood:2388] Out: 900 [Urine:900] Intake/Output this shift: No intake/output data recorded.  Ab wound dressed  Lab Results:  Recent Labs    06/19/24 0726 06/20/24 0322  WBC 2.1* 2.0*  HGB 8.2* 7.9*  HCT 26.4* 24.7*  PLT 339 370   BMET Recent Labs    06/19/24 0726 06/20/24 0322  NA 134* 134*  K 3.3* 3.6  CL 104 105  CO2 19* 19*  GLUCOSE 101* 108*  BUN 5* 5*  CREATININE 0.83 0.77  CALCIUM  8.4* 8.5*   PT/INR No results for input(s): LABPROT, INR in the last 72 hours. ABG No results for input(s): PHART, HCO3 in the last 72 hours.  Invalid input(s): PCO2, PO2  Studies/Results: CT MAXILLOFACIAL W CONTRAST Result Date: 06/19/2024 EXAM: CT Face with contrast 06/19/2024 02:07:40 PM TECHNIQUE: CT of the face was performed with the administration of 75 mL of iohexol  (OMNIPAQUE ) 300 MG/ML solution. Multiplanar reformatted images are provided for review. Automated exposure control, iterative reconstruction, and/or weight based adjustment of the mA/kV was utilized to reduce the radiation dose to as low as reasonably achievable. COMPARISON: None available CLINICAL HISTORY: Concern for abscess in the right upper molars. Pt has AIDS, possibility of atypical infection. FINDINGS: AERODIGESTIVE TRACT: No mass. No edema. SALIVARY GLANDS: No acute abnormality. LYMPH NODES: No suspicious cervical lymphadenopathy. SOFT TISSUES: There is no significant soft tissue swelling along the right aspect of the mandible to suggest soft tissue abscess.  There is a small focus of stranding in the left facial soft tissues overlying the mandible seen on series 2 image 30 concerning for cellulitis. No evidence of abscess. BRAIN, ORBITS AND SINUSES: Mucosal thickening in the left maxillary sinus. Minimal mucosal thickening in the left sphenoid sinus. Leftward deviation of the nasal septum. BONES: Dental caries of a right mandibular molar as well as a periapical lucency of this tooth. Additional dental caries of the left mandibular third molar. No acute maxillofacial fracture. No suspicious bone lesion. IMPRESSION: 1. Dental caries of a right mandibular molar with associated periapical lucency, without adjacent soft tissue abscess. 2. Additional dental caries of the left mandibular third molar. 3. Small focus of stranding in the left facial soft tissues overlying the mandible, concerning for cellulitis. No abscess. Electronically signed by: Beverly Mania MD 06/19/2024 02:45 PM EST RP Workstation: [id number]    Anti-infectives: Anti-infectives (From admission, onward)    Start     Dose/Rate Route Frequency Ordered Stop   06/19/24 1300  amoxicillin -clavulanate (AUGMENTIN ) 875-125 MG per tablet 1 tablet        1 tablet Oral Every 12 hours 06/19/24 1200 06/26/24 0959   06/15/24 0000  fluconazole  (DIFLUCAN ) 200 MG tablet        200 mg Oral Daily 06/14/24 1023 07/03/24 2359   06/12/24 1330  fluconazole  (DIFLUCAN ) tablet 200 mg        200 mg Oral Daily 06/12/24 1230 07/03/24 0959   06/06/24 1000  bictegravir-emtricitabine -tenofovir  AF (BIKTARVY ) 50-200-25 MG per tablet 1 tablet        1 tablet Oral Daily 06/05/24  1927     06/06/24 1000  atovaquone  (MEPRON ) 750 MG/5ML suspension 1,500 mg        1,500 mg Oral Daily 06/05/24 1927     06/05/24 1645  vancomycin  (VANCOCIN ) IVPB 1000 mg/200 mL premix        1,000 mg 200 mL/hr over 60 Minutes Intravenous On call to O.R. 06/05/24 1628 06/05/24 2040   06/05/24 1631  vancomycin  (VANCOCIN ) 1-5 GM/200ML-% IVPB       Note  to Pharmacy: Beverly [name] W: cabinet override      06/05/24 1631 06/06/24 0444       Assessment/Plan: POD 14 s/p drainage of wound infection by Dr. 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 Rogelio (GYN)  -reg diet as abl -vac today, can try to see if work out home changes with sister otherwise will take off prior to dc -EGD 12/23- probable candidiasis, needs 3 weeks diflucan  per ID -HH not able to be arranged for patient as her insurance is out of network with all agencies. Pt may possibly be able to discharge to her sister's home -we will continue to follow her wound   FEN - regular diet as able VTE - Lovenox  ID - diflucan , Biktarvy , Mepron , nystatin    Norovirus   AIDS/FUO: per ID  Beverly [name] 06/20/2024

## 2024-06-21 DIAGNOSIS — T8149XA Infection following a procedure, other surgical site, initial encounter: Secondary | ICD-10-CM | POA: Diagnosis not present

## 2024-06-21 LAB — COMPREHENSIVE METABOLIC PANEL WITH GFR
ALT: 6 U/L (ref 0–44)
AST: 16 U/L (ref 15–41)
Albumin: 2.2 g/dL — ABNORMAL LOW (ref 3.5–5.0)
Alkaline Phosphatase: 124 U/L (ref 38–126)
Anion gap: 11 (ref 5–15)
BUN: <5 mg/dL — ABNORMAL LOW (ref 6–20)
CO2: 19 mmol/L — ABNORMAL LOW (ref 22–32)
Calcium: 8.5 mg/dL — ABNORMAL LOW (ref 8.9–10.3)
Chloride: 106 mmol/L (ref 98–111)
Creatinine, Ser: 0.78 mg/dL (ref 0.44–1.00)
GFR, Estimated: >60 mL/min
Glucose, Bld: 94 mg/dL (ref 70–99)
Potassium: 3.4 mmol/L — ABNORMAL LOW (ref 3.5–5.1)
Sodium: 135 mmol/L (ref 135–145)
Total Bilirubin: 0.3 mg/dL (ref 0.0–1.2)
Total Protein: 6.8 g/dL (ref 6.5–8.1)

## 2024-06-21 LAB — CBC
HCT: 25.7 % — ABNORMAL LOW (ref 36.0–46.0)
Hemoglobin: 8 g/dL — ABNORMAL LOW (ref 12.0–15.0)
MCH: 25.3 pg — ABNORMAL LOW (ref 26.0–34.0)
MCHC: 31.1 g/dL (ref 30.0–36.0)
MCV: 81.3 fL (ref 80.0–100.0)
Platelets: 393 K/uL (ref 150–400)
RBC: 3.16 MIL/uL — ABNORMAL LOW (ref 3.87–5.11)
RDW: 18.1 % — ABNORMAL HIGH (ref 11.5–15.5)
WBC: 2 K/uL — ABNORMAL LOW (ref 4.0–10.5)
nRBC: 0 % (ref 0.0–0.2)

## 2024-06-21 LAB — CULTURE, BLOOD (ROUTINE X 2)
Culture: NO GROWTH
Culture: NO GROWTH
Special Requests: ADEQUATE
Special Requests: ADEQUATE

## 2024-06-21 LAB — MAGNESIUM: Magnesium: 1.6 mg/dL — ABNORMAL LOW (ref 1.7–2.4)

## 2024-06-21 LAB — PHOSPHORUS: Phosphorus: 4.2 mg/dL (ref 2.5–4.6)

## 2024-06-21 MED ORDER — FENTANYL CITRATE (PF) 50 MCG/ML IJ SOSY: 12.5000 ug | PREFILLED_SYRINGE | INTRAMUSCULAR | Status: DC | PRN

## 2024-06-21 MED ORDER — MAGNESIUM SULFATE 2 GM/50ML IV SOLN
2.0000 g | Freq: Once | INTRAVENOUS | Status: AC
  Administered 2024-06-21: 2 g via INTRAVENOUS
  Filled 2024-06-21: qty 50

## 2024-06-21 MED ORDER — PREGABALIN 50 MG PO CAPS
50.0000 mg | ORAL_CAPSULE | Freq: Two times a day (BID) | ORAL | Status: DC
  Administered 2024-06-21 – 2024-06-27 (×12): 50 mg via ORAL
  Filled 2024-06-21 (×13): qty 1

## 2024-06-21 NOTE — Plan of Care (Signed)
  Problem: Activity: Goal: Risk for activity intolerance will decrease Outcome: Progressing   Problem: Pain Managment: Goal: General experience of comfort will improve and/or be controlled Outcome: Progressing   Problem: Skin Integrity: Goal: Risk for impaired skin integrity will decrease Outcome: Progressing

## 2024-06-21 NOTE — Progress Notes (Signed)
 Patient ID: Beverly [name], female   DOB: 06-Sep-1980, 43 y.o.   MRN: [id number] Will see wound Friday with vac change, please call sooner if needed

## 2024-06-21 NOTE — Plan of Care (Signed)
   Problem: Activity: Goal: Risk for activity intolerance will decrease Outcome: Progressing   Problem: Coping: Goal: Level of anxiety will decrease Outcome: Progressing   Problem: Pain Managment: Goal: General experience of comfort will improve and/or be controlled Outcome: Progressing

## 2024-06-21 NOTE — Progress Notes (Signed)
 " PROGRESS NOTE    Beverly [name]  FMW:[id number] DOB: [birth year] DOA: 06/05/2024 PCP: Melvenia Corean SAILOR, NP  No chief complaint on file.   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  She was discharged home after the procedure.  She then presented to the surgery office on 12/15 with increased pain around her wound and persistent drainage.  She was direct admitted to the hospital.  There was concern for wound infection.  She underwent I&D of the abdominal wound on 12/15.  Wound grew MRSA and Pseudomonas, but because of adequate drainage and clean wound postoperatively she was not continued on antibiotics.  She was given vancomycin  for surgical prophylaxis.  Surgery planned for wound education and discharge home but patient began experiencing nausea, vomiting, dizziness, diarrhea and the hospitalist service was consulted. Patient was found to have norovirus and esophageal candidiasis.  Infectious disease was consulted. Hospital course has been further prolonged due to persistent fever.  Subjective: No further episodes of fever Primary complaint is of pain in right face and jaw with patient reporting inability to open mouth well.  The pain has caused her to not be able to sleep and she is having trouble eating.  Objective: Vitals:   06/20/24 1030 06/20/24 1647 06/20/24 2318 06/21/24 0624  BP: 130/88 (!) 128/91 (!) 134/92 (!) 135/98  Pulse: 87 84 86 83  Resp: 18 16 18 18   Temp: 97.8 F (36.6 C) 98 F (36.7 C)    TempSrc:  Oral    SpO2: 100% 100% 99% 98%  Weight:      Height:        Intake/Output Summary (Last 24 hours) at 06/21/2024 0740 Last data filed at 06/21/2024 0200 Gross per 24 hour  Intake 811.19 ml  Output --  Net 811.19 ml   Filed Weights   06/05/24 1625  Weight: 53 kg    Examination: General exam: Appears calm and uncomfortable but overall is in no acute  distress Respiratory system: No work of breathing, symmetric chest wall expansion room air ENT: Prior provider noted multiple fractured teeth with buccal mucosal laceration near the back bottom of the right molars without bleeding or purulent drainage.  Patient having significant pain today and unable to open mouth well for follow-up exam. Cardiovascular system: S1 & S2 heard, RRR.  Normotensive, no peripheral edema Gastrointestinal system: Abdomen is nondistended, soft does not appear to be exquisitely tender to palpation Neuro: Alert and oriented. No focal neurological deficits.  Assessment & Plan:  Principal Problem:   Wound infection after surgery Active Problems:   Diarrhea   Esophageal candidiasis (HCC)   Thrush   Fever   Thrombophlebitis    Acute diarrhea Norovirus infection - Abdominal pain appears to be secondary to recent surgery - CT: inflammatory changes in perineum, anterior pubic wall, labia, urethral orifice - No fluid collection noted on CT.  Ulceration of the skin of anterior pubic and labial areas documented - General Surgery has reviewed scan and reports these are expected findings considering recent surgical intervention - C. difficile antigen positive but toxin negative - Diarrhea does appear to be resolving albeit very slowly.  Occasional episode of emesis. - Continue with IV fluids until diarrhea and vomiting slows-also oral intake has been marginal given dental pain and facial pain - ID is consulted and they feel this is a protracted case of norovirus due to her HIV/AIDS status.  ID maintains no concern for active C. difficile infection  Tooth pain - Patient complaining of pain in her right molars and pain along the cheek.  CT scan negative for abscess - Restarted on Augmentin  12/29.  She reports the pain is becoming more severe - Does have small laceration on the inside of her cheek does not appear acutely infected but has multiple fractured teeth and is  certainly high risk of developing an abscess - Will need close outpatient follow-up with dentist for likely extraction in the future - Currently on Augmentin . Continue Magic mouthwash with lidocaine  -- Patient has been on low-dose Lyrica  and have increased from 25 mg to 50 mg twice daily; have added microdose fentanyl  IV to supplement oxycodone .  Have decreased diet from regular to full liquid and have added warm compresses to be applied to the right cheek.  Fever - Intermittent fever.  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  Acid-fast still pending - Recent exam in the past 24 hours: Abdominal wound was malodorous with some discharge on 12/30 linezolid  and ceftazidime  was initiated x1 week. 12/15 wound culture results are positive for MRSA and Pseudomonas  - If fever recurs without clear origins may need to consider bone marrow biopsy  Vulvar lesions - Multiple white plaques involving the mons, periclitoral area and right labia majora.  Patient taken to the OR for excisional biopsies with OB/GYN on 12/10. 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 - Hemoglobin dropped to 6.8 on 12/28.  Status post 1 unit PRBC, Hgb up to 8.0 as of 12/31 - No overt bleeding noted.  Abdominal exam does not reveal point tenderness.  Patient appears to be moving easily in bed.  Consider repeat CT abdomen if  hemoglobin continues to drop  - Iron panel checked in September.  Ferritin 92, iron 63, TIBC 259 TSAT 24 - Folic acid  low.  Continue with supplementation - Trend CBC  Hematochezia -  x1.  Has resolved now.  Hemoglobin continues to downtrend.  If she experiences melena or hematochezia again we will consult gastroenterology for colonoscopy  Left upper extremity superficial thrombophlebitis - Superficial thrombophlebitis in left antecubital area.  Continue with warm compress  Esophageal candidiasis - Status post EGD 12/23.  Biopsies negative for malignancy - Diflucan  x 3 weeks  Nausea and vomiting, resolved - No evidence of SBO on CT - Continue to monitor closely  Abdominal wound Squamous cell carcinoma abdominal wall - 12/10 excision of abdominal wound which was positive for squamous cell carcinoma - Postoperative course complicated by wound infection requiring I&D 12/15 - Wound cultures with MRSA and Pseudomonas however surgical team felt antibiotics not necessary and deemed patient had adequate source control in OR - CT scan shows inflammatory changes without fluid collection - General Surgery following wound, patient does not have access to home health per TOC.  Have been doing wet-to-dry dressings.  Surgery applied  wound VAC on 12/30 and will continue this modality of treatment while the patient remains hospitalized.  Next VAC change will be on Friday 1/2.  She does not have outpatient wound VAC options per TOC - Given changes to wound appearance on 12/30 antibiotics for MRSA/Pseudomonas coverage x1 week will initiate  Abnormal urinalysis Urine culture with Citrobacter and Klebsiella - ID following reports asymptomatic bacteriuria no need for antibiotics  HIV AIDS Leukopenia, neutropenia, normocytic anemia - CD4 count less than 35 - On Biktarvy  and atovaquone  - ID following  Hypomagnesemia Hyponatremia - Continue supplement as needed  Dizziness Hypotension - On IV  fluids with some improvement continue monitor closely   DVT prophylaxis: Lovneox   Code Status: Full Code Disposition: Inpatient pending clinical resolution.  Apparently unable to arrange for home health due to patient insurance being out of network with all agencies.  Hopefully will be able to DC to sister's home eventually.  Consultants:  Surgery Infectious disease  Procedures:  Drainage of wound infection by Dr. Dasie 12/15  Excision of SCC on abdominal wall by Dr. Curvin 12/10 multiple punch biopsies of the mons area, excision vulva lesions by Dr. Rogelio (GYN)   Antimicrobials:  Anti-infectives (From admission, onward)    Start     Dose/Rate Route Frequency Ordered Stop   06/20/24 1445  cefTAZidime  (FORTAZ ) 2 g in sodium chloride  0.9 % 100 mL IVPB        2 g 200 mL/hr over 30 Minutes Intravenous Every 8 hours 06/20/24 1355 06/27/24 1559   06/20/24 1445  linezolid  (ZYVOX ) tablet 600 mg        600 mg Oral Every 12 hours 06/20/24 1355 06/27/24 1759   06/19/24 1300  amoxicillin -clavulanate (AUGMENTIN ) 875-125 MG per tablet 1 tablet  Status:  Discontinued        1 tablet Oral Every 12 hours 06/19/24 1200 06/20/24 1357   06/15/24 0000  fluconazole  (DIFLUCAN ) 200 MG tablet        200 mg Oral Daily 06/14/24 1023 07/03/24 2359   06/12/24 1330  fluconazole  (DIFLUCAN ) tablet 200 mg        200 mg Oral Daily 06/12/24 1230 07/03/24 0959   06/06/24 1000  bictegravir-emtricitabine -tenofovir  AF (BIKTARVY ) 50-200-25 MG per tablet 1 tablet        1 tablet Oral Daily 06/05/24 1927     06/06/24 1000  atovaquone  (MEPRON ) 750 MG/5ML suspension 1,500 mg        1,500 mg Oral Daily 06/05/24 1927     06/05/24 1645  vancomycin  (VANCOCIN ) IVPB 1000 mg/200 mL premix        1,000 mg 200 mL/hr over 60 Minutes Intravenous On call to O.R. 06/05/24 1628 06/05/24 2040   06/05/24 1631  vancomycin  (VANCOCIN ) 1-5 GM/200ML-% IVPB       Note to Pharmacy: [name] W: cabinet override      06/05/24 1631  06/06/24 0444       Data Reviewed: I have personally reviewed following labs and imaging studies CBC: Recent Labs  Lab 06/17/24 0318 06/18/24 0337 06/18/24 0444 06/18/24 2146 06/19/24 0726 06/20/24 0322 06/21/24 0322  WBC 1.5* 1.7*  --   --  2.1* 2.0* 2.0*  NEUTROABS 0.6* 0.6*  --   --   --   --   --   HGB 7.4* 6.7* 6.8* 8.2* 8.2* 7.9* 8.0*  HCT 24.0* 21.8* 21.8* 26.7* 26.4* 24.7* 25.7*  MCV 81.4 80.7  --   --  83.0 81.8 81.3  PLT 339  304  --   --  339 370 393   Basic Metabolic Panel: Recent Labs  Lab 06/17/24 0318 06/18/24 0337 06/19/24 0726 06/20/24 0322 06/21/24 0322  NA 134* 133* 134* 134* 135  K 3.9 3.1* 3.3* 3.6 3.4*  CL 102 103 104 105 106  CO2 23 20* 19* 19* 19*  GLUCOSE 93 88 101* 108* 94  BUN 10 9 5* 5* <5*  CREATININE 0.86 0.89 0.83 0.77 0.78  CALCIUM  8.5* 8.1* 8.4* 8.5* 8.5*  MG  --   --  1.5* 2.2 1.6*  PHOS  --   --  3.4 3.2 4.2   GFR: Estimated Creatinine Clearance: 69.8 mL/min (by C-G formula based on SCr of 0.78 mg/dL). Liver Function Tests: Recent Labs  Lab 06/16/24 0316 06/18/24 0337 06/19/24 0726 06/20/24 0322 06/21/24 0322  AST 15 16 18 23 16   ALT 6 <5 5 7 6   ALKPHOS 151* 124 138* 133* 124  BILITOT 0.6 0.4 0.7 0.4 0.3  PROT 8.1 7.0 7.1 7.1 6.8  ALBUMIN 2.8* 2.4* 2.4* 2.4* 2.2*   CBG: No results for input(s): GLUCAP in the last 168 hours.  Recent Results (from the past 240 hours)  Urine Culture (for pregnant, neutropenic or urologic patients or patients with an indwelling urinary catheter)     Status: Abnormal   Collection Time: 06/11/24  9:50 AM   Specimen: Urine, Clean Catch  Result Value Ref Range Status   Specimen Description   Final    URINE, CLEAN CATCH Performed at [facility], [address] [place], [place] [address]    Special Requests   Final    NONE Performed at [facility], [address] [place], [place] [address]    Culture (A)  Final    >=100,000 COLONIES/mL  CITROBACTER FREUNDII 60,000 COLONIES/mL KLEBSIELLA PNEUMONIAE    Report Status 06/15/2024 FINAL  Final   Organism ID, Bacteria CITROBACTER FREUNDII (A)  Final   Organism ID, Bacteria KLEBSIELLA PNEUMONIAE (A)  Final      Susceptibility   Citrobacter freundii - MIC*    CEFEPIME  <=0.12 SENSITIVE Sensitive     ERTAPENEM <=0.12 SENSITIVE Sensitive     CEFTRIAXONE  0.5 SENSITIVE Sensitive     CIPROFLOXACIN  <=0.06 SENSITIVE Sensitive     GENTAMICIN <=1 SENSITIVE Sensitive     NITROFURANTOIN  <=16 SENSITIVE Sensitive     TRIMETH /SULFA  <=20 SENSITIVE Sensitive     PIP/TAZO Value in next row Sensitive      <=4 SENSITIVEThis is a modified FDA-approved test that has been validated and its performance characteristics determined by the reporting laboratory.  This laboratory is certified under the Clinical Laboratory Improvement Amendments CLIA as qualified to perform high complexity clinical laboratory testing.    MEROPENEM Value in next row Sensitive      <=4 SENSITIVEThis is a modified FDA-approved test that has been validated and its performance characteristics determined by the reporting laboratory.  This laboratory is certified under the Clinical Laboratory Improvement Amendments CLIA as qualified to perform high complexity clinical laboratory testing.    * >=100,000 COLONIES/mL CITROBACTER FREUNDII   Klebsiella pneumoniae - MIC*    AMPICILLIN  Value in next row Resistant      <=4 SENSITIVEThis is a modified FDA-approved test that has been validated and its performance characteristics determined by the reporting laboratory.  This laboratory is certified under the Clinical Laboratory Improvement Amendments CLIA as qualified to perform high complexity clinical laboratory testing.    CEFAZOLIN  (URINE) Value in next  row Sensitive      2 SENSITIVEThis is a modified FDA-approved test that has been validated and its performance characteristics determined by the reporting laboratory.  This laboratory is  certified under the Clinical Laboratory Improvement Amendments CLIA as qualified to perform high complexity clinical laboratory testing.    CEFEPIME  Value in next row Sensitive      2 SENSITIVEThis is a modified FDA-approved test that has been validated and its performance characteristics determined by the reporting laboratory.  This laboratory is certified under the Clinical Laboratory Improvement Amendments CLIA as qualified to perform high complexity clinical laboratory testing.    ERTAPENEM Value in next row Sensitive      2 SENSITIVEThis is a modified FDA-approved test that has been validated and its performance characteristics determined by the reporting laboratory.  This laboratory is certified under the Clinical Laboratory Improvement Amendments CLIA as qualified to perform high complexity clinical laboratory testing.    CEFTRIAXONE  Value in next row Sensitive      2 SENSITIVEThis is a modified FDA-approved test that has been validated and its performance characteristics determined by the reporting laboratory.  This laboratory is certified under the Clinical Laboratory Improvement Amendments CLIA as qualified to perform high complexity clinical laboratory testing.    CIPROFLOXACIN  Value in next row Sensitive      2 SENSITIVEThis is a modified FDA-approved test that has been validated and its performance characteristics determined by the reporting laboratory.  This laboratory is certified under the Clinical Laboratory Improvement Amendments CLIA as qualified to perform high complexity clinical laboratory testing.    GENTAMICIN Value in next row Sensitive      2 SENSITIVEThis is a modified FDA-approved test that has been validated and its performance characteristics determined by the reporting laboratory.  This laboratory is certified under the Clinical Laboratory Improvement Amendments CLIA as qualified to perform high complexity clinical laboratory testing.    NITROFURANTOIN  Value in next row  Intermediate      2 SENSITIVEThis is a modified FDA-approved test that has been validated and its performance characteristics determined by the reporting laboratory.  This laboratory is certified under the Clinical Laboratory Improvement Amendments CLIA as qualified to perform high complexity clinical laboratory testing.    TRIMETH /SULFA  Value in next row Sensitive      2 SENSITIVEThis is a modified FDA-approved test that has been validated and its performance characteristics determined by the reporting laboratory.  This laboratory is certified under the Clinical Laboratory Improvement Amendments CLIA as qualified to perform high complexity clinical laboratory testing.    AMPICILLIN /SULBACTAM Value in next row Sensitive      2 SENSITIVEThis is a modified FDA-approved test that has been validated and its performance characteristics determined by the reporting laboratory.  This laboratory is certified under the Clinical Laboratory Improvement Amendments CLIA as qualified to perform high complexity clinical laboratory testing.    PIP/TAZO Value in next row Sensitive      <=4 SENSITIVEThis is a modified FDA-approved test that has been validated and its performance characteristics determined by the reporting laboratory.  This laboratory is certified under the Clinical Laboratory Improvement Amendments CLIA as qualified to perform high complexity clinical laboratory testing.    MEROPENEM Value in next row Sensitive      <=4 SENSITIVEThis is a modified FDA-approved test that has been validated and its performance characteristics determined by the reporting laboratory.  This laboratory is certified under the Clinical Laboratory Improvement Amendments CLIA as qualified to perform high complexity clinical laboratory testing.    *  60,000 COLONIES/mL KLEBSIELLA PNEUMONIAE  Gastrointestinal Panel by PCR , Stool     Status: Abnormal   Collection Time: 06/12/24  9:52 PM   Specimen: STOOL  Result Value Ref Range  Status   Campylobacter species NOT DETECTED NOT DETECTED Final   Plesimonas shigelloides NOT DETECTED NOT DETECTED Final   Salmonella species NOT DETECTED NOT DETECTED Final   Yersinia enterocolitica NOT DETECTED NOT DETECTED Final   Vibrio species NOT DETECTED NOT DETECTED Final   Vibrio cholerae NOT DETECTED NOT DETECTED Final   Enteroaggregative E coli (EAEC) NOT DETECTED NOT DETECTED Final   Enteropathogenic E coli (EPEC) NOT DETECTED NOT DETECTED Final   Enterotoxigenic E coli (ETEC) NOT DETECTED NOT DETECTED Final   Shiga like toxin producing E coli (STEC) NOT DETECTED NOT DETECTED Final   Shigella/Enteroinvasive E coli (EIEC) NOT DETECTED NOT DETECTED Final   Cryptosporidium NOT DETECTED NOT DETECTED Final   Cyclospora cayetanensis NOT DETECTED NOT DETECTED Final   Entamoeba histolytica NOT DETECTED NOT DETECTED Final   Giardia lamblia NOT DETECTED NOT DETECTED Final   Adenovirus F40/41 NOT DETECTED NOT DETECTED Final   Astrovirus NOT DETECTED NOT DETECTED Final   Norovirus GI/GII DETECTED (A) NOT DETECTED Final    Comment: RESULT CALLED TO, READ BACK BY AND VERIFIED WITH: LAMAR NINE RN @2114  06/13/24 ASW    Rotavirus A NOT DETECTED NOT DETECTED Final   Sapovirus (I, II, IV, and V) DETECTED (A) NOT DETECTED Final    Comment: Performed at [facility], [address] [place], [place] [address]  C Difficile Quick Screen (NO PCR Reflex)     Status: Abnormal   Collection Time: 06/12/24  9:52 PM   Specimen: STOOL  Result Value Ref Range Status   C Diff antigen POSITIVE (A) NEGATIVE Final   C Diff toxin NEGATIVE NEGATIVE Final   C Diff interpretation   Final    Results are indeterminate. Please contact the provider listed for your campus for C diff questions in AMION.    Comment: Performed at [facility], [address], [place], [place] [address]  Culture, blood (Routine X 2) w Reflex to ID Panel     Status: None   Collection Time: 06/16/24  10:34 AM   Specimen: BLOOD RIGHT ARM  Result Value Ref Range Status   Specimen Description   Final    BLOOD RIGHT ARM Performed at [facility], [address] [place], [place] [address]    Special Requests   Final    BOTTLES DRAWN AEROBIC AND ANAEROBIC Blood Culture adequate volume Performed at [facility], [address] [place], [place] [address]    Culture   Final    NO GROWTH 5 DAYS Performed at [facility], [address] [place], [place] [address]    Report Status 06/21/2024 FINAL  Final  Culture, blood (Routine X 2) w Reflex to ID Panel     Status: None   Collection Time: 06/16/24 10:34 AM   Specimen: BLOOD RIGHT HAND  Result Value Ref Range Status   Specimen Description   Final    BLOOD RIGHT HAND Performed at [facility], [address], [place], [place] [address]    Special Requests   Final    BOTTLES DRAWN AEROBIC AND ANAEROBIC Blood Culture adequate volume Performed at [facility], [address] [place], [place] [address]    Culture   Final    NO GROWTH  5 DAYS Performed at [facility], [address], [place], [place] [address]    Report Status 06/21/2024 FINAL  Final  Culture, blood (Routine X 2) w Reflex to ID Panel     Status: None (Preliminary result)   Collection Time: 06/17/24 11:17 AM   Specimen: BLOOD  Result Value Ref Range Status   Specimen Description   Final    BLOOD BLOOD RIGHT ARM Performed at [facility], [address] [place], [place] [address]    Special Requests   Final    BOTTLES DRAWN AEROBIC AND ANAEROBIC Blood Culture adequate volume Performed at [facility], [address], [place], [place] [address]    Culture   Final    NO GROWTH 4 DAYS Performed at [facility], [address], [place], [place] [address]    Report Status PENDING  Incomplete  Culture, blood (Routine X 2) w Reflex to ID Panel     Status: None  (Preliminary result)   Collection Time: 06/17/24 11:22 AM   Specimen: BLOOD  Result Value Ref Range Status   Specimen Description   Final    BLOOD BLOOD RIGHT ARM Performed at [facility], [address], [place], [place] [address]    Special Requests   Final    BOTTLES DRAWN AEROBIC ONLY Blood Culture results may not be optimal due to an inadequate volume of blood received in culture bottles Performed at [facility], [address], [place], [place] [address]    Culture   Final    NO GROWTH 4 DAYS Performed at [facility], [address] [place], [place] [address]    Report Status PENDING  Incomplete  Acid Fast Smear (AFB)     Status: None   Collection Time: 06/18/24  5:11 PM  Result Value Ref Range Status   AFB Specimen Processing Concentration  Final   Acid Fast Smear Negative  Final    Comment: (NOTE) Performed At: [facility] [address] [place], [place] [id number] Jennette Shorter MD Ey:[id number]    Source (AFB) BLOOD  Final    Comment: Performed at [facility], [address] [place], [place] [address]     Radiology Studies: CT MAXILLOFACIAL W CONTRAST Result Date: 06/19/2024 EXAM: CT Face with contrast 06/19/2024 02:07:40 PM TECHNIQUE: CT of the face was performed with the administration of 75 mL of iohexol  (OMNIPAQUE ) 300 MG/ML solution. Multiplanar reformatted images are provided for review. Automated exposure control, iterative reconstruction, and/or weight based adjustment of the mA/kV was utilized to reduce the radiation dose to as low as reasonably achievable. COMPARISON: None available CLINICAL HISTORY: Concern for abscess in the right upper molars. Pt has AIDS, possibility of atypical infection. FINDINGS: AERODIGESTIVE TRACT: No mass. No edema. SALIVARY GLANDS: No acute abnormality. LYMPH NODES: No suspicious cervical lymphadenopathy. SOFT TISSUES: There is no significant soft tissue  swelling along the right aspect of the mandible to suggest soft tissue abscess. There is a small focus of stranding in the left facial soft tissues overlying the mandible seen on series 2 image 30 concerning for cellulitis. No evidence of abscess. BRAIN, ORBITS AND SINUSES: Mucosal thickening in the left maxillary sinus. Minimal mucosal thickening in the left sphenoid sinus. Leftward deviation of the nasal septum. BONES: Dental caries of a right mandibular molar as well as a periapical lucency of this tooth. Additional dental caries of the left mandibular third molar. No acute  maxillofacial fracture. No suspicious bone lesion. IMPRESSION: 1. Dental caries of a right mandibular molar with associated periapical lucency, without adjacent soft tissue abscess. 2. Additional dental caries of the left mandibular third molar. 3. Small focus of stranding in the left facial soft tissues overlying the mandible, concerning for cellulitis. No abscess. Electronically signed by: Donnice Mania MD 06/19/2024 02:45 PM EST RP Workstation: [id number]     Scheduled Meds:  acetaminophen   1,000 mg Oral Q6H   atovaquone   1,500 mg Oral Daily   bictegravir-emtricitabine -tenofovir  AF  1 tablet Oral Daily   enoxaparin  (LOVENOX ) injection  40 mg Subcutaneous Q24H   fluconazole   200 mg Oral Daily   folic acid   1 mg Oral Daily   lamoTRIgine   25 mg Oral q morning   linezolid   600 mg Oral Q12H   methocarbamol   500 mg Oral QID   pantoprazole   40 mg Oral Daily   pregabalin   25 mg Oral BID   Continuous Infusions:  cefTAZidime  (FORTAZ )  IV 2 g (06/21/24 0058)   magnesium  sulfate bolus IVPB        LOS: 15 days  MDM: Patient is high risk for one or more organ failure.  They necessitate ongoing hospitalization for continued IV therapies and subsequent lab monitoring. Total time spent interpreting labs and vitals, reviewing the medical record, coordinating care amongst consultants and care team members, directly assessing and  discussing care with the patient and/or family: 35 min    Isaiah Lever, ANP [facility]  To contact the attending physician between 7A-7P please use Epic Chat. To contact the covering physician during after hours 7P-7A, please review Amion.  06/21/2024, 7:40 AM   *This document has been created with the assistance of dictation software. Please excuse typographical errors. *   "

## 2024-06-22 DIAGNOSIS — T8149XA Infection following a procedure, other surgical site, initial encounter: Secondary | ICD-10-CM | POA: Diagnosis not present

## 2024-06-22 LAB — CULTURE, BLOOD (ROUTINE X 2)
Culture: NO GROWTH
Culture: NO GROWTH
Special Requests: ADEQUATE

## 2024-06-22 MED ORDER — MAGNESIUM SULFATE 4 GM/100ML IV SOLN
4.0000 g | Freq: Once | INTRAVENOUS | Status: AC
  Administered 2024-06-22: 4 g via INTRAVENOUS
  Filled 2024-06-22: qty 100

## 2024-06-22 MED ORDER — POTASSIUM CHLORIDE CRYS ER 20 MEQ PO TBCR
40.0000 meq | EXTENDED_RELEASE_TABLET | ORAL | Status: AC
  Administered 2024-06-22 (×2): 40 meq via ORAL
  Filled 2024-06-22 (×2): qty 2

## 2024-06-22 NOTE — Plan of Care (Signed)
  Problem: Activity: Goal: Risk for activity intolerance will decrease Outcome: Progressing   Problem: Coping: Goal: Level of anxiety will decrease Outcome: Progressing   Problem: Pain Managment: Goal: General experience of comfort will improve and/or be controlled Outcome: Progressing   Problem: Safety: Goal: Ability to remain free from injury will improve Outcome: Progressing

## 2024-06-22 NOTE — Progress Notes (Signed)
 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  She was discharged home after the procedure.  She then presented to the surgery office on 12/15 with increased pain around her wound and persistent drainage.  She was direct admitted to the hospital.  There was concern for wound infection.  She underwent I&D of the abdominal wound on 12/15.  Wound grew MRSA and Pseudomonas, but because of adequate drainage and clean wound postoperatively she was not continued on antibiotics.  She was given vancomycin  for surgical prophylaxis.  The patient began experiencing nausea, vomiting, dizziness, diarrhea and the hospitalist service was consulted. She was found to have norovirus and esophageal candidiasis.  Infectious disease was consulted.  Subjective:  Diarrhea present but improving. No vomiting.   Assessment and Plan: Principal Problem:   Wound infection after surgery/ squamous cell cancer of abdominal wall - initial excision on 12/10 followed by infection with MRSA and pseudomonas - continue management by general surgery and ID - currently on Zyvox  and Ceftaz (plan for 7 days) - currently has wound vac and do to lack of access to home health, the wound is being managed in the hospital  Active Problems: Norovirus - advance to solid food today as nausea and vomiting has resolved    Esophageal candidiasis  - cont flucolazole until 07/03/24   Thrombophlebitis  - LUE- cont local care  Fever - last fever was 12/29  Dental pain - current antibiotic regimen will cover abscess  Hematochezia - x 1 episode- resolved  HIV - cont Biktarvy  and atovaquone   Aymptomatic bacteruria     Code Status: Full Code Total time on patient care: 30 min DVT prophylaxis:   enoxaparin  (LOVENOX ) injection 40 mg Start: 06/06/24 0800 SCDs Start: 06/05/24 1925     Objective:   Vitals:   06/21/24 1333 06/21/24 2239 06/22/24 0206 06/22/24 0940  BP: 125/87 109/86 (!) 124/93 (!) 125/91  Pulse: 98 (!) 102 80 75  Resp: 15 16 16 17   Temp: 98.3 F (36.8 C) 98.9 F (37.2 C) 98.3 F (36.8 C) 97.9 F (36.6 C)  TempSrc:    Oral  SpO2: 100% 100% 100% 100%  Weight:      Height:       Filed Weights   06/05/24 1625  Weight: 53 kg   Exam: General exam: Appears comfortable  HEENT: oral mucosa moist Respiratory system: Clear to auscultation.  Cardiovascular system: S1 & S2 heard  Gastrointestinal system: Abdomen soft, non-tender, nondistended. Normal bowel sounds  wound vac present in left lower quadrant  Extremities: No cyanosis, clubbing or edema Psychiatry:  Mood & affect appropriate.      CBC: Recent Labs  Lab 06/17/24 0318 06/18/24 0337 06/18/24 0444 06/18/24 2146 06/19/24 0726 06/20/24 0322 06/21/24 0322  WBC 1.5* 1.7*  --   --  2.1* 2.0* 2.0*  NEUTROABS 0.6* 0.6*  --   --   --   --   --   HGB 7.4* 6.7* 6.8* 8.2* 8.2* 7.9* 8.0*  HCT 24.0* 21.8* 21.8* 26.7* 26.4* 24.7* 25.7*  MCV 81.4 80.7  --   --  83.0 81.8 81.3  PLT 339 304  --   --  339 370 393   Basic Metabolic Panel: Recent  Labs  Lab 06/17/24 0318 06/18/24 0337 06/19/24 0726 06/20/24 0322 06/21/24 0322  NA 134* 133* 134* 134* 135  K 3.9 3.1* 3.3* 3.6 3.4*  CL 102 103 104 105 106  CO2 23 20* 19* 19* 19*  GLUCOSE 93 88 101* 108* 94  BUN 10 9 5* 5* <5*  CREATININE 0.86 0.89 0.83 0.77 0.78  CALCIUM  8.5* 8.1* 8.4* 8.5* 8.5*  MG  --   --  1.5* 2.2 1.6*  PHOS  --   --  3.4 3.2 4.2     Scheduled Meds:  acetaminophen   1,000 mg Oral Q6H   atovaquone   1,500 mg Oral Daily   bictegravir-emtricitabine -tenofovir  AF  1 tablet Oral Daily   enoxaparin  (LOVENOX ) injection  40 mg Subcutaneous Q24H   fluconazole   200 mg Oral Daily   folic acid   1 mg Oral Daily   lamoTRIgine   25 mg Oral  q morning   linezolid   600 mg Oral Q12H   methocarbamol   500 mg Oral QID   pantoprazole   40 mg Oral Daily   potassium chloride   40 mEq Oral Q4H   pregabalin   50 mg Oral BID    Imaging and lab data personally reviewed   Author: Alica [name]  06/22/2024 10:37 AM  To contact [facility]>   Check the care team in [facility] and look for the attending/consulting [facility] provider listed  Log into [email or web address] and use [place]'s universal password   Go to> [facility]  and find provider  If you still have difficulty reaching the provider, please page the [facility] (Director on Call) for the Hospitalists listed on amion     "

## 2024-06-23 DIAGNOSIS — T8149XA Infection following a procedure, other surgical site, initial encounter: Secondary | ICD-10-CM | POA: Diagnosis not present

## 2024-06-23 LAB — BASIC METABOLIC PANEL WITH GFR
Anion gap: 9 (ref 5–15)
BUN: 6 mg/dL (ref 6–20)
CO2: 20 mmol/L — ABNORMAL LOW (ref 22–32)
Calcium: 8.2 mg/dL — ABNORMAL LOW (ref 8.9–10.3)
Chloride: 104 mmol/L (ref 98–111)
Creatinine, Ser: 0.78 mg/dL (ref 0.44–1.00)
GFR, Estimated: >60 mL/min
Glucose, Bld: 87 mg/dL (ref 70–99)
Potassium: 3.2 mmol/L — ABNORMAL LOW (ref 3.5–5.1)
Sodium: 133 mmol/L — ABNORMAL LOW (ref 135–145)

## 2024-06-23 LAB — PHOSPHORUS: Phosphorus: 3.5 mg/dL (ref 2.5–4.6)

## 2024-06-23 LAB — MAGNESIUM: Magnesium: 1.9 mg/dL (ref 1.7–2.4)

## 2024-06-23 LAB — CBC
HCT: 25.6 % — ABNORMAL LOW (ref 36.0–46.0)
Hemoglobin: 8 g/dL — ABNORMAL LOW (ref 12.0–15.0)
MCH: 25.4 pg — ABNORMAL LOW (ref 26.0–34.0)
MCHC: 31.3 g/dL (ref 30.0–36.0)
MCV: 81.3 fL (ref 80.0–100.0)
Platelets: 356 K/uL (ref 150–400)
RBC: 3.15 MIL/uL — ABNORMAL LOW (ref 3.87–5.11)
RDW: 18.4 % — ABNORMAL HIGH (ref 11.5–15.5)
WBC: 1.6 K/uL — ABNORMAL LOW (ref 4.0–10.5)
nRBC: 0 % (ref 0.0–0.2)

## 2024-06-23 NOTE — Plan of Care (Signed)
  Problem: Safety: Goal: Ability to remain free from injury will improve Outcome: Progressing   Problem: Pain Managment: Goal: General experience of comfort will improve and/or be controlled Outcome: Progressing   Problem: Coping: Goal: Level of anxiety will decrease Outcome: Progressing   Problem: Activity: Goal: Risk for activity intolerance will decrease Outcome: Progressing

## 2024-06-23 NOTE — Progress Notes (Addendum)
 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  She was discharged home after the procedure.  She then presented to the surgery office on 12/15 with increased pain around her wound and persistent drainage.  She was direct admitted to the hospital.  There was concern for wound infection.  She underwent I&D of the abdominal wound on 12/15.  Wound grew MRSA and Pseudomonas.  NO post op antibiotics given as the wound was adequately debrided by general surgery. She remained in the hospital for wound care.  12/21 The patient began experiencing nausea, vomiting, dizziness, diarrhea and the hospitalist service was consulted. 12/22 ID and GI consulted- + for norovirus, C diff antigen + but toxin neg. 12/23 on EGD, found to have esophageal candidiasis.    Subjective:  She states she had an episode of vomiting yesterday and vomited up all of her breakfast.  Assessment and Plan: Principal Problem:   Wound infection after surgery/ squamous cell cancer of abdominal wall - initial excision on 12/10 followed by infection with MRSA and pseudomonas - last fever 12/29 - 12/30 started on Zyvox  and Ceftaz (plan for 7 days with stop date on 1/6) - has wound vac and general surgery is assisting with managing wound care  Active Problems: Norovirus - advanced to solid food on 1/1 however, she is still having some trouble tolerating this due to vomiting - Continue as needed antiemetics   Oral and Esophageal candidiasis  - cont flucolazole until 07/03/24  Concern for MAC - AFB negative  C diff antigen+   Thrombophlebitis  - LUE- cont local care  Dental pain - current antibiotic regimen will cover abscess  Hematochezia - x 1 episode- resolved  HIV - cont  Biktarvy  and atovaquone - has h/o non compliance - last CD4 < 35  History of high-grade cervical dysplasia and vulvar lesions - underwent colposcopy, loop electrosurgical excisional procedure and vulvar biopsies on 05/31/2024, 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, moderate to severe squamous dysplasia with mild dysplasia and HPV effect    Aymptomatic bacteruria     Code Status: Full Code Total time on patient care: 30 min DVT prophylaxis:    She is refusing Lovenox     Objective:   Vitals:   06/22/24 1349 06/22/24 2128 06/23/24 0504 06/23/24 1012  BP: 124/87 (!) 132/96 127/84 123/86  Pulse: 76 80 88   Resp: 17 16 17    Temp: (!) 97.5 F (36.4 C) 97.6 F (36.4 C) 98.2 F (36.8 C)   TempSrc:  Oral Oral   SpO2: 100% 100% 100%   Weight:      Height:       Filed Weights   06/05/24 1625  Weight: 53 kg   Exam: General exam: Appears comfortable  HEENT: oral mucosa moist Respiratory system: Clear to auscultation.  Cardiovascular system: S1 & S2 heard  Gastrointestinal system: Abdomen soft, non-tender, nondistended. Normal bowel sounds  wound vac present in left lower quadrant  Extremities: No cyanosis, clubbing or edema Psychiatry:  Mood & affect appropriate.      CBC: Recent Labs  Lab 06/17/24 0318 06/18/24 9662 06/18/24 0444 06/18/24 2146 06/19/24 0726 06/20/24 0322 06/21/24 0322 06/23/24 0924  WBC 1.5* 1.7*  --   --  2.1* 2.0* 2.0* 1.6*  NEUTROABS 0.6* 0.6*  --   --   --   --   --   --   HGB 7.4* 6.7*   < > 8.2* 8.2* 7.9* 8.0* 8.0*  HCT 24.0* 21.8*   < > 26.7* 26.4* 24.7* 25.7* 25.6*  MCV 81.4 80.7  --   --  83.0 81.8 81.3 81.3  PLT 339 304  --   --  339 370 393 356   < > = values in this interval not displayed.   Basic Metabolic Panel: Recent Labs  Lab 06/18/24 0337 06/19/24 0726 06/20/24 0322 06/21/24 0322 06/23/24 0924  NA 133* 134* 134* 135 133*  K 3.1* 3.3* 3.6 3.4* 3.2*  CL 103 104 105 106 104  CO2 20* 19* 19* 19* 20*  GLUCOSE  88 101* 108* 94 87  BUN 9 5* 5* <5* 6  CREATININE 0.89 0.83 0.77 0.78 0.78  CALCIUM  8.1* 8.4* 8.5* 8.5* 8.2*  MG  --  1.5* 2.2 1.6* 1.9  PHOS  --  3.4 3.2 4.2 3.5     Scheduled Meds:  acetaminophen   1,000 mg Oral Q6H   atovaquone   1,500 mg Oral Daily   bictegravir-emtricitabine -tenofovir  AF  1 tablet Oral Daily   enoxaparin  (LOVENOX ) injection  40 mg Subcutaneous Q24H   fluconazole   200 mg Oral Daily   folic acid   1 mg Oral Daily   lamoTRIgine   25 mg Oral q morning   linezolid   600 mg Oral Q12H   methocarbamol   500 mg Oral QID   pantoprazole   40 mg Oral Daily   pregabalin   50 mg Oral BID    Imaging and lab data personally reviewed   Author: [employer]  06/23/2024 1:05 PM  To contact [facility]>   Check the care team in [facility] and look for the attending/consulting [facility] provider listed  Log into [email or web address] and use [place]'s universal password   Go to> [facility]  and find provider  If you still have difficulty reaching the provider, please page the [facility] (Director on Call) for the Hospitalists listed on amion     "

## 2024-06-23 NOTE — Progress Notes (Addendum)
 10 Days Post-Op   Subjective/Chief Complaint: Still with some loose stool  Pt currently undergoing wound VAC change at bedside by wound care nurse   Objective: Vital signs in last 24 hours: Temp:  [97.5 F (36.4 C)-98.2 F (36.8 C)] 98.2 F (36.8 C) (01/02 0504) Pulse Rate:  [76-88] 88 (01/02 0504) Resp:  [16-17] 17 (01/02 0504) BP: (123-132)/(84-96) 123/86 (01/02 1012) SpO2:  [100 %] 100 % (01/02 0504) Last BM Date : 06/22/24  Intake/Output from previous day: 01/01 0701 - 01/02 0700 In: 860 [P.O.:260; IV Piggyback:600] Out: 125 [Emesis/NG output:50; Drains:75] Intake/Output this shift: Total I/O In: 0  Out: 100 [Drains:100]  Lower abdominal wound -looks good.  Good granulation tissue.  Only trace minor superficial slough in the midportion of wound bed Some sutures still intact on the right side of the abdominal wall without cellulitis   Lab Results:  Recent Labs    06/21/24 0322 06/23/24 0924  WBC 2.0* 1.6*  HGB 8.0* 8.0*  HCT 25.7* 25.6*  PLT 393 356   BMET Recent Labs    06/21/24 0322 06/23/24 0924  NA 135 133*  K 3.4* 3.2*  CL 106 104  CO2 19* 20*  GLUCOSE 94 87  BUN <5* 6  CREATININE 0.78 0.78  CALCIUM  8.5* 8.2*   PT/INR No results for input(s): LABPROT, INR in the last 72 hours. ABG No results for input(s): PHART, HCO3 in the last 72 hours.  Invalid input(s): PCO2, PO2  Studies/Results: No results found.   Anti-infectives: Anti-infectives (From admission, onward)    Start     Dose/Rate Route Frequency Ordered Stop   06/20/24 1445  cefTAZidime  (FORTAZ ) 2 g in sodium chloride  0.9 % 100 mL IVPB        2 g 200 mL/hr over 30 Minutes Intravenous Every 8 hours 06/20/24 1355 06/27/24 1559   06/20/24 1445  linezolid  (ZYVOX ) tablet 600 mg        600 mg Oral Every 12 hours 06/20/24 1355 06/27/24 1759   06/19/24 1300  amoxicillin -clavulanate (AUGMENTIN ) 875-125 MG per tablet 1 tablet  Status:  Discontinued        1 tablet Oral Every  12 hours 06/19/24 1200 06/20/24 1357   06/15/24 0000  fluconazole  (DIFLUCAN ) 200 MG tablet        200 mg Oral Daily 06/14/24 1023 07/03/24 2359   06/12/24 1330  fluconazole  (DIFLUCAN ) tablet 200 mg        200 mg Oral Daily 06/12/24 1230 07/03/24 0959   06/06/24 1000  bictegravir-emtricitabine -tenofovir  AF (BIKTARVY ) 50-200-25 MG per tablet 1 tablet        1 tablet Oral Daily 06/05/24 1927     06/06/24 1000  atovaquone  (MEPRON ) 750 MG/5ML suspension 1,500 mg        1,500 mg Oral Daily 06/05/24 1927     06/05/24 1645  vancomycin  (VANCOCIN ) IVPB 1000 mg/200 mL premix        1,000 mg 200 mL/hr over 60 Minutes Intravenous On call to O.R. 06/05/24 1628 06/05/24 2040   06/05/24 1631  vancomycin  (VANCOCIN ) 1-5 GM/200ML-% IVPB       Note to Pharmacy: [name] W: cabinet override      06/05/24 1631 06/06/24 0444       Assessment/Plan: s/p drainage of wound infection by Dr. 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 Rogelio (GYN)  -reg diet as abl -  vac today, can try to see if work out home changes with sister otherwise will take off prior to dc -EGD 12/23- probable candidiasis, needs 3 weeks diflucan  per ID -HH not able to be arranged for patient as her insurance is out of network with all agencies. Pt may possibly be able to discharge to her sister's home -we will continue to follow her wound -Will remove remaining sutures at next wound VAC change or just prior to discharge  PT WILL BE PRN WEEKEND FOR OUR SERVICE. Please call if issue over the weekend   FEN - regular diet as able VTE - Lovenox  ID - diflucan , Biktarvy , Mepron , nystatin    Norovirus   AIDS/FUO: per ID  Camellia HERO. Tanda, MD, FACS General, Bariatric, & Minimally Invasive Surgery [facility],  [facility]  Camellia Tanda 06/23/2024

## 2024-06-23 NOTE — Consult Note (Addendum)
 WOC Nurse wound follow up Dr. Tanda was present. Wound type: Surgical. Measurement:3 cm x 14.5 x 3 cm dept. Wound bed: 90% red, 105 yellow. Drainage (amount, consistency, odor) Small amount, serosanguinous, 100 ml in canister at 1030. Periwound: intact. Dressing procedure/placement/frequency: Removed old NPWT dressing Cleansed wound with normal saline Periwound skin protected with skin barrier wipe  Filled wound with 1 piece of black foam  Sealed NPWT dressing at HG Patient received IV pain medication per bedside nurse prior to dressing change Patient tolerated procedure with amount of discomfort.  WOC nurse will continue to provide NPWT dressing changed due to the complexity of the dressing change.     WOC team will follow TUE. Please reconsult if further assistance is needed. Thank-you,  Lela Holm MSN, RN, CWCN, CNS.  (Phone [phone])

## 2024-06-24 DIAGNOSIS — T8149XA Infection following a procedure, other surgical site, initial encounter: Secondary | ICD-10-CM | POA: Diagnosis not present

## 2024-06-24 MED ORDER — POTASSIUM CHLORIDE CRYS ER 20 MEQ PO TBCR
40.0000 meq | EXTENDED_RELEASE_TABLET | ORAL | Status: AC
  Administered 2024-06-24 (×2): 40 meq via ORAL
  Filled 2024-06-24 (×2): qty 2

## 2024-06-24 NOTE — Progress Notes (Signed)
 Patient refuse lovenox  injection. States she is afraid of bleeding and last time her injection site  was very sore for weeks after injection. Education provided on Lovenox  VTE. Risk and benefit discussed including risk of blood clot and death. Patient again declined. MD notified.

## 2024-06-24 NOTE — Plan of Care (Signed)
  Problem: Safety: Goal: Ability to remain free from injury will improve Outcome: Progressing   Problem: Pain Managment: Goal: General experience of comfort will improve and/or be controlled Outcome: Progressing   Problem: Coping: Goal: Level of anxiety will decrease Outcome: Progressing

## 2024-06-24 NOTE — Progress Notes (Signed)
 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  She was discharged home after the procedure.  She then presented to the surgery office on 12/15 with increased pain around her wound and persistent drainage.  She was direct admitted to the hospital.  There was concern for wound infection.  She underwent I&D of the abdominal wound on 12/15.  Wound grew MRSA and Pseudomonas.  NO post op antibiotics given as the wound was adequately debrided by general surgery. She remained in the hospital for wound care.  12/21 The patient began experiencing nausea, vomiting, dizziness, diarrhea and the hospitalist service was consulted. 12/22 ID and GI consulted- + for norovirus, C diff antigen + but toxin neg. 12/23 on EGD, found to have esophageal candidiasis.    Subjective:  States she had 5 episodes of loose stools yesterday.   Assessment and Plan: Principal Problem:   Wound infection after surgery/ squamous cell cancer of abdominal wall - initial excision on 12/10 followed by infection with MRSA and pseudomonas - last fever 12/29 - 12/30 started on Zyvox  and Ceftaz (plan for 7 days with stop date on 1/6) - has wound vac and general surgery is assisting with managing wound care  Active Problems: Norovirus - advanced to solid food on 1/1  - Did not vomit yesterday but still having numerous loose stools daily   Oral and Esophageal candidiasis  - cont flucolazole until 07/03/24  Concern for MAC - AFB negative  C diff antigen+   Thrombophlebitis  - LUE- cont local care  Dental pain - current antibiotic regimen will cover abscess  Hematochezia - x 1 episode- resolved  HIV - cont Biktarvy  and atovaquone - has h/o non compliance - last CD4 < 35  History  of high-grade cervical dysplasia and vulvar lesions - underwent colposcopy, loop electrosurgical excisional procedure and vulvar biopsies on 05/31/2024, 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, moderate to severe squamous dysplasia with mild dysplasia and HPV effect    Aymptomatic bacteruria     Code Status: Full Code Total time on patient care: 30 min DVT prophylaxis:    She is refusing Lovenox     Objective:   Vitals:   06/23/24 1012 06/23/24 1348 06/23/24 2145 06/24/24 0548  BP: 123/86 120/84 108/79 106/75  Pulse:  95 82 99  Resp:  15 16 16   Temp:  98.2 F (36.8 C) 98.1 F (36.7 C) 97.9 F (36.6 C)  TempSrc:  Oral Oral Oral  SpO2:  100% 100% 100%  Weight:      Height:       Filed Weights   06/05/24 1625  Weight: 53 kg   Exam: General exam: Appears comfortable  HEENT: oral mucosa moist Respiratory system: Clear to auscultation.  Cardiovascular system: S1 & S2 heard  Gastrointestinal system: Abdomen soft, non-tender, nondistended. Normal bowel sounds  wound vac present in left lower quadrant  Extremities: No cyanosis, clubbing or edema Psychiatry:  Mood & affect appropriate.      CBC: Recent Labs  Lab 06/18/24 0337 06/18/24 0444 06/18/24 2146 06/19/24 0726 06/20/24 0322 06/21/24 0322 06/23/24 0924  WBC 1.7*  --   --  2.1* 2.0* 2.0* 1.6*  NEUTROABS 0.6*  --   --   --   --   --   --  HGB 6.7*   < > 8.2* 8.2* 7.9* 8.0* 8.0*  HCT 21.8*   < > 26.7* 26.4* 24.7* 25.7* 25.6*  MCV 80.7  --   --  83.0 81.8 81.3 81.3  PLT 304  --   --  339 370 393 356   < > = values in this interval not displayed.   Basic Metabolic Panel: Recent Labs  Lab 06/18/24 0337 06/19/24 0726 06/20/24 0322 06/21/24 0322 06/23/24 0924  NA 133* 134* 134* 135 133*  K 3.1* 3.3* 3.6 3.4* 3.2*  CL 103 104 105 106 104  CO2 20* 19* 19* 19* 20*  GLUCOSE 88 101* 108* 94 87  BUN 9 5* 5* <5* 6  CREATININE 0.89 0.83 0.77 0.78 0.78  CALCIUM  8.1* 8.4* 8.5* 8.5* 8.2*  MG  --   1.5* 2.2 1.6* 1.9  PHOS  --  3.4 3.2 4.2 3.5     Scheduled Meds:  acetaminophen   1,000 mg Oral Q6H   atovaquone   1,500 mg Oral Daily   bictegravir-emtricitabine -tenofovir  AF  1 tablet Oral Daily   enoxaparin  (LOVENOX ) injection  40 mg Subcutaneous Q24H   fluconazole   200 mg Oral Daily   folic acid   1 mg Oral Daily   lamoTRIgine   25 mg Oral q morning   linezolid   600 mg Oral Q12H   pantoprazole   40 mg Oral Daily   pregabalin   50 mg Oral BID    Imaging and lab data personally reviewed   Author: Costella Schwarz  06/24/2024 11:07 AM  To contact [facility]>   Check the care team in [facility] and look for the attending/consulting [facility] provider listed  Log into [email or web address] and use [place]'s universal password   Go to> [facility]  and find provider  If you still have difficulty reaching the provider, please page the [facility] (Director on Call) for the Hospitalists listed on amion     "

## 2024-06-25 DIAGNOSIS — T8149XA Infection following a procedure, other surgical site, initial encounter: Secondary | ICD-10-CM | POA: Diagnosis not present

## 2024-06-25 LAB — BASIC METABOLIC PANEL WITH GFR
Anion gap: 9 (ref 5–15)
BUN: 7 mg/dL (ref 6–20)
CO2: 19 mmol/L — ABNORMAL LOW (ref 22–32)
Calcium: 8.3 mg/dL — ABNORMAL LOW (ref 8.9–10.3)
Chloride: 106 mmol/L (ref 98–111)
Creatinine, Ser: 0.71 mg/dL (ref 0.44–1.00)
GFR, Estimated: >60 mL/min
Glucose, Bld: 67 mg/dL — ABNORMAL LOW (ref 70–99)
Potassium: 3.6 mmol/L (ref 3.5–5.1)
Sodium: 134 mmol/L — ABNORMAL LOW (ref 135–145)

## 2024-06-25 MED ORDER — SODIUM CHLORIDE 0.9% FLUSH: 10.0000 mL | INTRAVENOUS | Status: DC | PRN

## 2024-06-25 MED ORDER — PROCHLORPERAZINE EDISYLATE 10 MG/2ML IJ SOLN
10.0000 mg | Freq: Three times a day (TID) | INTRAMUSCULAR | Status: DC
  Administered 2024-06-25 – 2024-06-26 (×7): 10 mg via INTRAVENOUS
  Filled 2024-06-25 (×9): qty 2

## 2024-06-25 NOTE — Consult Note (Signed)
 WOC Nurse Consult Note: Reason for Consult: Wound vac clotted. Old dried blood in line close to wound. Unable to disconnect tubing to flush.  Wound type: surgical  Per hospital policy provided wound care orders for dressing change. Saline moist gauze dressings daily until NPWT can be replaced.  WOC will see patient Monday 06/26/24 Pressure Injury POA: Yes/No/NA   Elsbeth Yearick MSN,RN,CWOCN, CNS, CWON-AP [phone]

## 2024-06-25 NOTE — Progress Notes (Signed)
 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  She was discharged home after the procedure.  She then presented to the surgery office on 12/15 with increased pain around her wound and persistent drainage.  She was direct admitted to the hospital.  There was concern for wound infection.  She underwent I&D of the abdominal wound on 12/15.  Wound grew MRSA and Pseudomonas.  NO post op antibiotics given as the wound was adequately debrided by general surgery. She remained in the hospital for wound care.  12/21 The patient began experiencing nausea, vomiting, dizziness, diarrhea and the hospitalist service was consulted. 12/22 ID and GI consulted- + for norovirus, C diff antigen + but toxin neg. 12/23 on EGD, found to have esophageal candidiasis.    Subjective:  Diarrhea improving with Imodium  but she had another episode of vomiting yesterday.   Assessment and Plan: Principal Problem:   Wound infection after surgery/ squamous cell cancer of abdominal wall - initial excision on 12/10 followed by infection with MRSA and pseudomonas - last fever 12/29 - 12/30 started on Zyvox  and Ceftaz (plan for 7 days with stop date on 1/6) - has wound vac and general surgery is assisting with managing wound care  Active Problems: Norovirus - advanced to solid food on 1/1  -have recommended to her to take antiemetic prior to meals to prevent vomiting - cont Imodium  PRN as well   Oral and Esophageal candidiasis  - cont flucolazole until 07/03/24  Concern for MAC - AFB negative  C diff antigen+   Thrombophlebitis  - LUE- cont local care  Dental pain - current antibiotic regimen will cover abscess  Hematochezia - x 1 episode- resolved  HIV - cont Biktarvy   and atovaquone - has h/o non compliance - last CD4 < 35  History of high-grade cervical dysplasia and vulvar lesions - underwent colposcopy, loop electrosurgical excisional procedure and vulvar biopsies on 05/31/2024, 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, moderate to severe squamous dysplasia with mild dysplasia and HPV effect    Aymptomatic bacteruria     Code Status: Full Code Total time on patient care: 30 min DVT prophylaxis:    She is refusing Lovenox     Objective:   Vitals:   06/23/24 2145 06/24/24 0548 06/24/24 1836 06/24/24 2247  BP: 108/79 106/75 (!) 126/91 (!) 130/99  Pulse: 82 99 83 97  Resp: 16 16 16 20   Temp: 98.1 F (36.7 C) 97.9 F (36.6 C)  (!) 97.4 F (36.3 C)  TempSrc: Oral Oral    SpO2: 100% 100% 100% 100%  Weight:      Height:       Filed Weights   06/05/24 1625  Weight: 53 kg   Exam: General exam: Appears comfortable  HEENT: oral mucosa moist Respiratory system: Clear to auscultation.  Cardiovascular system: S1 & S2 heard  Gastrointestinal system: Abdomen soft, non-tender, nondistended. Normal bowel sounds  wound vac present in left lower quadrant  Extremities: No cyanosis, clubbing or edema Psychiatry:  Mood & affect appropriate.      CBC: Recent Labs  Lab 06/18/24 2146 06/19/24 0726 06/20/24 0322 06/21/24 0322 06/23/24 0924  WBC  --  2.1* 2.0* 2.0* 1.6*  HGB 8.2* 8.2* 7.9*  8.0* 8.0*  HCT 26.7* 26.4* 24.7* 25.7* 25.6*  MCV  --  83.0 81.8 81.3 81.3  PLT  --  339 370 393 356   Basic Metabolic Panel: Recent Labs  Lab 06/19/24 0726 06/20/24 0322 06/21/24 0322 06/23/24 0924 06/25/24 0352  NA 134* 134* 135 133* 134*  K 3.3* 3.6 3.4* 3.2* 3.6  CL 104 105 106 104 106  CO2 19* 19* 19* 20* 19*  GLUCOSE 101* 108* 94 87 67*  BUN 5* 5* <5* 6 7  CREATININE 0.83 0.77 0.78 0.78 0.71  CALCIUM  8.4* 8.5* 8.5* 8.2* 8.3*  MG 1.5* 2.2 1.6* 1.9  --   PHOS 3.4 3.2 4.2 3.5  --      Scheduled Meds:  acetaminophen   1,000 mg  Oral Q6H   atovaquone   1,500 mg Oral Daily   bictegravir-emtricitabine -tenofovir  AF  1 tablet Oral Daily   enoxaparin  (LOVENOX ) injection  40 mg Subcutaneous Q24H   fluconazole   200 mg Oral Daily   folic acid   1 mg Oral Daily   lamoTRIgine   25 mg Oral q morning   linezolid   600 mg Oral Q12H   pantoprazole   40 mg Oral Daily   pregabalin   50 mg Oral BID    Imaging and lab data personally reviewed   Author: Berwyn Bigley  06/25/2024 9:56 AM  To contact [facility]>   Check the care team in [facility] and look for the attending/consulting [facility] provider listed  Log into [email or web address] and use [place]'s universal password   Go to> [facility]  and find provider  If you still have difficulty reaching the provider, please page the [facility] (Director on Call) for the Hospitalists listed on amion     "

## 2024-06-26 DIAGNOSIS — T8149XA Infection following a procedure, other surgical site, initial encounter: Secondary | ICD-10-CM | POA: Diagnosis not present

## 2024-06-26 LAB — CREATININE, SERUM
Creatinine, Ser: 0.7 mg/dL (ref 0.44–1.00)
GFR, Estimated: >60 mL/min

## 2024-06-26 MED ORDER — LOPERAMIDE HCL 2 MG PO CAPS
4.0000 mg | ORAL_CAPSULE | Freq: Three times a day (TID) | ORAL | Status: DC
  Administered 2024-06-26 – 2024-06-27 (×4): 4 mg via ORAL
  Filled 2024-06-26 (×4): qty 2

## 2024-06-26 MED ORDER — HYDROMORPHONE HCL 1 MG/ML IJ SOLN: 0.5000 mg | INTRAMUSCULAR | Status: DC | PRN

## 2024-06-26 NOTE — Progress Notes (Signed)
 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  She was discharged home after the procedure.  She then presented to the surgery office on 12/15 with increased pain around her wound and persistent drainage.  She was direct admitted to the hospital.  There was concern for wound infection.  She underwent I&D of the abdominal wound on 12/15.  Wound grew MRSA and Pseudomonas.  NO post op antibiotics given as the wound was adequately debrided by general surgery. She remained in the hospital for wound care.  12/21 The patient began experiencing nausea, vomiting, dizziness, diarrhea and the hospitalist service was consulted. 12/22 ID and GI consulted- + for norovirus, C diff antigen + but toxin neg. 12/23 on EGD, found to have esophageal candidiasis.    Subjective:  She had 3 loose stools yesterday. No vomiting.   Assessment and Plan: Principal Problem:   Wound infection after surgery/ squamous cell cancer of abdominal wall - initial excision on 12/10 followed by infection with MRSA and pseudomonas - last fever 12/29 - 12/30 started on Zyvox  and Ceftaz (plan for 7 days with stop date on 1/6) - has wound vac and general surgery is assisting with managing wound care  Active Problems: Norovirus - advanced to solid food on 1/1 - vomiting resolving - She still has frequent stools but only receiving Imodium  once a day- will change from PRN to TID routine until diarrhea stops   Oral and Esophageal candidiasis  - cont flucolazole until 07/03/24  Concern for MAC - AFB negative  C diff antigen+   Thrombophlebitis  - LUE- cont local care  Dental pain - current antibiotic regimen will cover abscess  Hematochezia - x 1 episode- resolved  HIV - cont  Biktarvy  and atovaquone - has h/o non compliance - last CD4 < 35  History of high-grade cervical dysplasia and vulvar lesions - underwent colposcopy, loop electrosurgical excisional procedure and vulvar biopsies on 05/31/2024, 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     Code Status: Full Code Total time on patient care: 30 min DVT prophylaxis:    She is refusing Lovenox     Objective:   Vitals:   06/24/24 2247 06/25/24 1425 06/25/24 2120 06/26/24 0527  BP: (!) 130/99 122/88 114/74 112/81  Pulse: 97 72 72 (!) 103  Resp: 20 16 18 18   Temp: (!) 97.4 F (36.3 C)  98.2 F (36.8 C) 97.9 F (36.6 C)  TempSrc:   Oral Oral  SpO2: 100% 100% 100% 98%  Weight:      Height:       Filed Weights   06/05/24 1625  Weight: 53 kg   Exam: General exam: Appears comfortable  HEENT: oral mucosa moist Respiratory system: Clear to auscultation.  Cardiovascular system: S1 & S2 heard  Gastrointestinal system: Abdomen soft, non-tender, nondistended. Normal bowel sounds  wound vac present in left lower quadrant  Extremities: No cyanosis, clubbing or edema Psychiatry:  Mood & affect appropriate.      CBC: Recent Labs  Lab 06/20/24 0322 06/21/24 0322 06/23/24 9075  WBC 2.0* 2.0* 1.6*  HGB 7.9* 8.0* 8.0*  HCT 24.7* 25.7* 25.6*  MCV 81.8 81.3 81.3  PLT 370 393 356   Basic Metabolic Panel: Recent Labs  Lab 06/20/24 0322 06/21/24 0322 06/23/24 0924 06/25/24 0352 06/26/24 0350  NA 134* 135 133* 134*  --   K 3.6 3.4* 3.2* 3.6  --   CL 105 106 104 106  --   CO2 19* 19* 20* 19*  --   GLUCOSE 108* 94 87 67*  --   BUN 5* <5* 6 7  --   CREATININE 0.77 0.78 0.78 0.71 0.70  CALCIUM  8.5* 8.5* 8.2* 8.3*  --   MG 2.2 1.6* 1.9  --   --   PHOS 3.2 4.2 3.5  --   --      Scheduled Meds:   acetaminophen   1,000 mg Oral Q6H   atovaquone   1,500 mg Oral Daily   bictegravir-emtricitabine -tenofovir  AF  1 tablet Oral Daily   enoxaparin  (LOVENOX ) injection  40 mg Subcutaneous Q24H   fluconazole   200 mg Oral Daily   folic acid   1 mg Oral Daily   lamoTRIgine   25 mg Oral q morning   linezolid   600 mg Oral Q12H   pantoprazole   40 mg Oral Daily   pregabalin   50 mg Oral BID   prochlorperazine   10 mg Intravenous TID AC & HS    Imaging and lab data personally reviewed   Author: [employer]  06/26/2024 10:49 AM  To contact [facility]>   Check the care team in [facility] and look for the attending/consulting [facility] provider listed  Log into [email or web address] and use 's universal password   Go to> [facility]  and find provider  If you still have difficulty reaching the provider, please page the [facility] (Director on Call) for the Hospitalists listed on amion     "

## 2024-06-26 NOTE — Discharge Instructions (Signed)
WOUND CARE: - midline dressing to be changed daily - supplies: sterile saline, gauze, scissors, tape  - remove dressing and all packing carefully, moistening with sterile saline as needed to avoid packing/internal dressing sticking to the wound. - clean edges of skin around the wound with water/gauze, making sure there is no tape debris or leakage left on skin that could cause skin irritation or breakdown. - dampen and clean gauze with sterile saline and pack wound from wound base to skin level, making sure to take note of any possible areas of wound tracking, tunneling and packing appropriately. Wound can be packed loosely. Trim gauze to size if a whole gauze is not required. - cover wound with a dry gauze and secure with tape.  - write the date/time on the dry dressing/tape to better track when the last dressing change occurred. - change dressing as needed if leakage occurs, wound gets contaminated, or patient requests to shower. - patient may shower daily with wound open (i.e. remove all packing) and following the shower the wound should be dried and a clean dressing placed.

## 2024-06-26 NOTE — Consult Note (Signed)
 WOC Nurse wound follow up Wound type: surgical  Measurement: 5cm x 15cm x 4cm  Wound bed: 100% clean; CCS PA removed sutures at today's dressing change  Drainage (amount, consistency, odor) serosanguinous  Periwound: intact  Dressing procedure/placement/frequency: Saline moist gauze dressing removed by surgical PA, sutures removed Filled wound with  __1__ piece of black foam  Sealed NPWT dressing at HG  Patient received PO pain medication per bedside nurse prior to dressing change Patient tolerated procedure well WOC nurse will continue to provide NPWT dressing changed due to the competency bedside nursing staff on the patient's current unit.   Nygel Prokop [facility], CNS, CWON-AP [phone]

## 2024-06-26 NOTE — Progress Notes (Signed)
 13 Days Post-Op   Subjective/Chief Complaint: Still with some loose stool and intermittent nausea   Objective: Vital signs in last 24 hours: Temp:  [97.9 F (36.6 C)-98.2 F (36.8 C)] 97.9 F (36.6 C) (01/05 0527) Pulse Rate:  [72-103] 103 (01/05 0527) Resp:  [16-18] 18 (01/05 0527) BP: (112-122)/(74-88) 112/81 (01/05 0527) SpO2:  [98 %-100 %] 98 % (01/05 0527) Last BM Date : 06/24/24  Intake/Output from previous day: No intake/output data recorded. Intake/Output this shift: No intake/output data recorded.  Lower abdominal wound -looks good.  Good granulation tissue.  Remaining 5 sutures removed  Lab Results:  No results for input(s): WBC, HGB, HCT, PLT in the last 72 hours.  BMET Recent Labs    06/25/24 0352 06/26/24 0350  NA 134*  --   K 3.6  --   CL 106  --   CO2 19*  --   GLUCOSE 67*  --   BUN 7  --   CREATININE 0.71 0.70  CALCIUM  8.3*  --    PT/INR No results for input(s): LABPROT, INR in the last 72 hours. ABG No results for input(s): PHART, HCO3 in the last 72 hours.  Invalid input(s): PCO2, PO2  Studies/Results: No results found.   Anti-infectives: Anti-infectives (From admission, onward)    Start     Dose/Rate Route Frequency Ordered Stop   06/20/24 1445  cefTAZidime  (FORTAZ ) 2 g in sodium chloride  0.9 % 100 mL IVPB        2 g 200 mL/hr over 30 Minutes Intravenous Every 8 hours 06/20/24 1355 06/27/24 1559   06/20/24 1445  linezolid  (ZYVOX ) tablet 600 mg        600 mg Oral Every 12 hours 06/20/24 1355 06/27/24 1759   06/19/24 1300  amoxicillin -clavulanate (AUGMENTIN ) 875-125 MG per tablet 1 tablet  Status:  Discontinued        1 tablet Oral Every 12 hours 06/19/24 1200 06/20/24 1357   06/15/24 0000  fluconazole  (DIFLUCAN ) 200 MG tablet        200 mg Oral Daily 06/14/24 1023 07/03/24 2359   06/12/24 1330  fluconazole  (DIFLUCAN ) tablet 200 mg        200 mg Oral Daily 06/12/24 1230 07/03/24 0959   06/06/24 1000   bictegravir-emtricitabine -tenofovir  AF (BIKTARVY ) 50-200-25 MG per tablet 1 tablet        1 tablet Oral Daily 06/05/24 1927     06/06/24 1000  atovaquone  (MEPRON ) 750 MG/5ML suspension 1,500 mg        1,500 mg Oral Daily 06/05/24 1927     06/05/24 1645  vancomycin  (VANCOCIN ) IVPB 1000 mg/200 mL premix        1,000 mg 200 mL/hr over 60 Minutes Intravenous On call to O.R. 06/05/24 1628 06/05/24 2040   06/05/24 1631  vancomycin  (VANCOCIN ) 1-5 GM/200ML-% IVPB       Note to Pharmacy: [name] W: cabinet override      06/05/24 1631 06/06/24 0444       Assessment/Plan: s/p drainage of wound infection by Dr. 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 Rogelio (GYN)  -reg diet as abl -vac today, but likely DC tomorrow per primary.  Will need to remove VAC and do WD dressings at home with her sister -EGD 12/23- probable candidiasis, needs 3 weeks diflucan  per ID -HH not able to be arranged for patient as her insurance is  out of network with all agencies. Pt may possibly be able to discharge to her sister's home -remaining sutures removed 1/5 -new follow up set up with Dr. Curvin for 1/27   FEN - regular diet as able VTE - Lovenox  ID - diflucan , Biktarvy , Mepron , nystatin    Norovirus   AIDS/FUO: per ID  Beverly [name], [facility],  [facility]  Beverly [name] 06/26/2024

## 2024-06-26 NOTE — Plan of Care (Signed)
  Problem: Activity: Goal: Risk for activity intolerance will decrease Outcome: Progressing   Problem: Nutrition: Goal: Adequate nutrition will be maintained Outcome: Progressing   Problem: Coping: Goal: Level of anxiety will decrease Outcome: Progressing   Problem: Pain Managment: Goal: General experience of comfort will improve and/or be controlled Outcome: Progressing

## 2024-06-27 ENCOUNTER — Other Ambulatory Visit (HOSPITAL_COMMUNITY): Payer: Self-pay

## 2024-06-27 DIAGNOSIS — B3781 Candidal esophagitis: Secondary | ICD-10-CM | POA: Insufficient documentation

## 2024-06-27 DIAGNOSIS — Z221 Carrier of other intestinal infectious diseases: Secondary | ICD-10-CM

## 2024-06-27 DIAGNOSIS — T8149XA Infection following a procedure, other surgical site, initial encounter: Secondary | ICD-10-CM | POA: Diagnosis not present

## 2024-06-27 MED ORDER — LOPERAMIDE HCL 2 MG PO TABS
4.0000 mg | ORAL_TABLET | Freq: Three times a day (TID) | ORAL | 0 refills | Status: AC | PRN
  Filled 2024-06-27: qty 12, 2d supply, fill #0
  Filled 2024-06-27: qty 30, 5d supply, fill #0

## 2024-06-27 NOTE — Progress Notes (Signed)
 TOC meds in a secure bag and home med (oxycodone ) in a secure bag retrieved from inpatient pharmacy and returned to patient

## 2024-06-27 NOTE — Progress Notes (Signed)
 Removed wound vac, cleaned adhesive from around wound, placed wet to dry dressing. Went over discharge papers with patient and answered any questions regarding meds and wound care. Patient comfortable with discharge instructions and wound care.

## 2024-06-27 NOTE — Discharge Summary (Addendum)
 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  She was discharged home after the procedure.  She then presented to the surgery office on 12/15 with increased pain around her wound and persistent drainage.  She was direct admitted to the hospital.  There was concern for wound infection.  She underwent I&D of the abdominal wound on 12/15.  Wound grew MRSA and Pseudomonas.  NO post op antibiotics given as the wound was adequately debrided by general surgery. She remained in the hospital for wound care.  12/21 The patient began experiencing nausea, vomiting, dizziness, diarrhea and the hospitalist service was consulted. 12/22 ID and GI consulted- + for norovirus, C diff antigen + but toxin neg. 12/23 on EGD, found to have esophageal candidiasis.    Due to inability to obtain home health, the patient has remained in the hospital for IV antibiotics and wound care with a wound vac.    Subjective:  She had 3 loose stools yesterday. No vomiting.    Assessment and Plan: Principal Problem:   Wound infection after surgery/ squamous cell cancer of abdominal wall - initial excision on 12/10 followed by infection with MRSA and pseudomonas - last fever 12/29 - 12/30 started on Zyvox  and Ceftaz (plan for  7 days with stop date on 1/6) - has wound vac and general surgery is assisting with managing wound care - today, wound vac removed and patient advised to do daily wet to dry dressing- she understands that she is at high risk for re-infection of the wound and needs to be extra cautious when cleaning it   Active Problems: Norovirus - advanced to solid food on 1/1 - vomiting resolving - She still has frequent stools but these have improved with Imodium     Oral and Esophageal candidiasis  - cont flucolazole until 07/03/24   Concern for MAC - AFB negative   C diff antigen+    Thrombophlebitis  - LUE- cont local care   Dental pain - current antibiotic regimen will cover abscess   Hematochezia - x 1 episode- resolved   HIV - cont Biktarvy  and atovaquone - has h/o non compliance but states she has been recently compliant - last CD4 < 35   History of high-grade cervical dysplasia and vulvar lesions - underwent colposcopy, loop electrosurgical excisional procedure and vulvar biopsies on 05/31/2024, 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               Discharge Instructions   Allergies as of 06/27/2024       Reactions   Bee Venom Anaphylaxis, Shortness Of Breath, Swelling   Morphine   Hives, Itching, Other (See Comments)   And- Tolerates hydrocodone , oxycodone , and codeine with no issues   Ferrlecit  [na Ferric Gluc Cplx In Sucrose] Swelling, Other (See Comments)   Swelling and puffiness in hands and legs after Ferrlecit  250 mg infused over 60 minutes (possibly infusion related reaction)   Latex Hives        Medication List     STOP taking these medications    albuterol  108 (90 Base) MCG/ACT inhaler Commonly known as: VENTOLIN  HFA   Atrovent  HFA 17 MCG/ACT inhaler Generic  drug: ipratropium   pantoprazole  40 MG tablet Commonly known as: Protonix        TAKE these medications    acetaminophen  325 MG tablet Commonly known as: TYLENOL  Take 2 tablets (650 mg total) by mouth every 6 (six) hours as needed for mild pain, fever or headache. What changed:  how much to take when to take this reasons to take this   atovaquone  750 MG/5ML suspension Commonly known as: MEPRON  Take 10 mLs (1,500 mg total) by mouth daily.   BENADRYL  ALLERGY PO Take 25 mg by mouth daily as needed (allergies).   bictegravir-emtricitabine -tenofovir  AF 50-200-25 MG Tabs tablet Commonly known as: BIKTARVY  Take 1 tablet by mouth daily.   fluconazole  200 MG tablet Commonly known as: DIFLUCAN  Take 1 tablet (200 mg total) by mouth daily for 18 days.   folic acid  1 MG tablet Commonly known as: FOLVITE  Take 1 tablet (1 mg total) by mouth daily.   gabapentin  100 MG capsule Commonly known as: NEURONTIN  Take 100 mg by mouth 3 (three) times daily.   lamoTRIgine  25 MG tablet Commonly known as: LAMICTAL  Take 25 mg by mouth every morning.   loperamide  2 MG capsule Commonly known as: IMODIUM  Take 2 capsules (4 mg total) by mouth 3 (three) times daily as needed for diarrhea or loose stools.   ondansetron  4 MG tablet Commonly known as: ZOFRAN  Take 1 tablet (4 mg total) by mouth every 6 (six) hours as needed for nausea. What changed:  when to take this reasons to take this   oxyCODONE  5 MG immediate release tablet Commonly known as: Roxicodone  Take 1 tablet (5 mg total) by mouth every 8 (eight) hours as needed. What changed:  when to take this reasons to take this   pregabalin  25 MG capsule Commonly known as: Lyrica  Take 1 capsule (25 mg total) by mouth 2 (two) times daily.   triamcinolone  ointment 0.1 % Commonly known as: KENALOG  Apply 1 Application topically 2 (two) times daily. What changed: when to take this        Follow-up Information     Curvin Deward MOULD, MD  Follow up on 07/18/2024.   Specialty: General Surgery Why: 11:00am, Arrive 15 minutes prior to your appointment time, Please bring your insurance card and photo ID Contact information: [address] [place] [address] [phone]                    The results of significant diagnostics from this hospitalization (including imaging, microbiology, ancillary and laboratory) are listed below for reference.    CT MAXILLOFACIAL W CONTRAST Result Date: 06/19/2024 EXAM: CT Face with contrast 06/19/2024 02:07:40 PM TECHNIQUE: CT of the face was performed with the administration of 75 mL of iohexol  (OMNIPAQUE ) 300 MG/ML solution. Multiplanar reformatted images are provided for review. Automated exposure control, iterative reconstruction, and/or weight based adjustment of the mA/kV was utilized to reduce the radiation dose to as low  as reasonably achievable. COMPARISON: None available CLINICAL HISTORY: Concern for abscess in the right upper molars. Pt has AIDS, possibility of atypical infection. FINDINGS: AERODIGESTIVE TRACT: No mass. No edema. SALIVARY GLANDS: No acute abnormality. LYMPH NODES: No suspicious cervical lymphadenopathy. SOFT TISSUES: There is no significant soft tissue swelling along the right aspect of the mandible to suggest soft tissue abscess. There is a small focus of stranding in the left facial soft tissues overlying the mandible seen on series 2 image 30 concerning for cellulitis. No evidence of abscess. BRAIN, ORBITS AND SINUSES: Mucosal thickening in the left maxillary sinus. Minimal mucosal thickening in the left sphenoid sinus. Leftward deviation of the nasal septum. BONES: Dental caries of a right mandibular molar as well as a periapical lucency of this tooth. Additional dental caries of the left mandibular third molar. No acute maxillofacial fracture. No suspicious bone lesion. IMPRESSION: 1. Dental caries of a right mandibular molar with associated  periapical lucency, without adjacent soft tissue abscess. 2. Additional dental caries of the left mandibular third molar. 3. Small focus of stranding in the left facial soft tissues overlying the mandible, concerning for cellulitis. No abscess. Electronically signed by: Donnice Mania MD 06/19/2024 02:45 PM EST RP Workstation: [id number]   VAS US  UPPER EXTREMITY VENOUS DUPLEX Result Date: 06/18/2024 UPPER VENOUS STUDY  Patient Name:  Beverly [name]  Date of Exam:   06/16/2024 Medical Rec #: [id number]         Accession #:    [id number] Date of Birth: 28-Oct-1980          Patient Gender: F Patient Age:   60 years Exam Location:  [facility] Procedure:      VAS US  UPPER EXTREMITY VENOUS DUPLEX Referring Phys: JOETTE PEBBLES --------------------------------------------------------------------------------  Indications: Pain and palpable cord at left [facility], site of prior IV Limitations: Bandages and line. Comparison Study: No prior study on file Performing Technologist: Alberta Lis RVS  Examination Guidelines: A complete evaluation includes B-mode imaging, spectral Doppler, color Doppler, and power Doppler as needed of all accessible portions of each vessel. Bilateral testing is considered an integral part of a complete examination. Limited examinations for reoccurring indications may be performed as noted.  Right Findings: +----------+------------+---------+-----------+----------+-------+ RIGHT     CompressiblePhasicitySpontaneousPropertiesSummary +----------+------------+---------+-----------+----------+-------+ Subclavian               Yes       Yes                      +----------+------------+---------+-----------+----------+-------+  Left Findings: +----------+------------+---------+-----------+----------+-----------+ LEFT      CompressiblePhasicitySpontaneousProperties  Summary   +----------+------------+---------+-----------+----------+-----------+ IJV           Full       Yes        Yes                          +----------+------------+---------+-----------+----------+-----------+ Subclavian    Full       Yes       Yes                          +----------+------------+---------+-----------+----------+-----------+ Axillary                 Yes       Yes                          +----------+------------+---------+-----------+----------+-----------+ Brachial  Full                                              +----------+------------+---------+-----------+----------+-----------+ Radial        Full                                              +----------+------------+---------+-----------+----------+-----------+ Ulnar         Full                                              +----------+------------+---------+-----------+----------+-----------+ Cephalic      None                                  Acute at [facility] +----------+------------+---------+-----------+----------+-----------+ Basilic       Full                                              +----------+------------+---------+-----------+----------+-----------+  Summary:  Right: No evidence of thrombosis in the subclavian.  Left: No evidence of deep vein thrombosis in the upper extremity. Findings consistent with acute superficial vein thrombosis involving the left cephalic vein at [facility].  *See table(s) above for measurements and observations.  Diagnosing physician: Debby Robertson Electronically signed by Debby Robertson on 06/18/2024 at 10:59:08 AM.    Final    CT CHEST ABDOMEN PELVIS W CONTRAST Result Date: 06/11/2024 CLINICAL DATA:  Shortness of breath.  Abdominal pain. EXAM: CT CHEST, ABDOMEN, AND PELVIS WITH CONTRAST TECHNIQUE: Multidetector CT imaging of the chest, abdomen and pelvis was performed following the standard protocol during bolus administration of intravenous contrast. RADIATION DOSE REDUCTION: This exam was performed according to the departmental dose-optimization program which  includes automated exposure control, adjustment of the mA and/or kV according to patient size and/or use of iterative reconstruction technique. CONTRAST:  OMNIPAQUE  IOHEXOL  300 MG/ML  SOLN COMPARISON:  Chest radiograph dated 03/01/2024. CT abdomen pelvis dated 02/12/2024. FINDINGS: CT CHEST FINDINGS Cardiovascular: There is no cardiomegaly or pericardial effusion. The thoracic aorta is unremarkable. The origins of the great vessels of the aortic arch and the central pulmonary arteries are patent. Mediastinum/Nodes: No hilar or mediastinal adenopathy. The esophagus is grossly unremarkable. No mediastinal fluid collection. Lungs/Pleura: No focal consolidation, pleural effusion or pneumothorax. The central airways are patent. Musculoskeletal: No acute osseous pathology. CT ABDOMEN PELVIS FINDINGS No intra-abdominal free air or free fluid. Hepatobiliary: The liver is unremarkable. No biliary ductal dilatation. The gallbladder is unremarkable. Pancreas: Unremarkable. No pancreatic ductal dilatation or surrounding inflammatory changes. Spleen: Normal in size without focal abnormality. Adrenals/Urinary Tract: The adrenal glands unremarkable. Small nonobstructing right renal calculi measure up to 5 mm in the interpolar right kidney. No hydronephrosis. The left kidney is unremarkable. The visualized ureters and urinary bladder appear unremarkable. Stomach/Bowel: There is no bowel obstruction or active inflammation. The appendix is normal. Vascular/Lymphatic: The abdominal aorta and IVC unremarkable. No portal venous gas. There is no adenopathy. Reproductive: Inflammatory  changes and thickening of the labia and urethral orifice with diffuse stranding and edema in the subcutaneous soft tissues of the anterior pubic wall and perineum. Correlation with clinical exam recommended. No drainable fluid collection or abscess. There is apparent ulceration of the skin with small pockets of soft tissue air in the anterior  pubic/labial region. Other: There is ulceration of the subcutaneous soft tissues across the anterior pelvic wall. No drainable fluid collection. Musculoskeletal: No acute osseous pathology. IMPRESSION: 1. No acute intrathoracic pathology. 2. Inflammatory changes and thickening of the labia and urethral orifice with diffuse stranding and edema in the subcutaneous soft tissues of the anterior pubic wall and perineum. 3. Ulceration of the skin of the anterior pubic and labial area. No drainable fluid collection or abscess. 4. Ulceration of the subcutaneous soft tissues across the anterior pelvic wall. No fluid collection. 5. No bowel obstruction. Normal appendix. 6. Small nonobstructing right renal calculi. No hydronephrosis. Electronically Signed   By: Vanetta Chou M.D.   On: 06/11/2024 17:51   Labs:   Basic Metabolic Panel: Recent Labs  Lab 06/21/24 0322 06/23/24 0924 06/25/24 0352 06/26/24 0350  NA 135 133* 134*  --   K 3.4* 3.2* 3.6  --   CL 106 104 106  --   CO2 19* 20* 19*  --   GLUCOSE 94 87 67*  --   BUN <5* 6 7  --   CREATININE 0.78 0.78 0.71 0.70  CALCIUM  8.5* 8.2* 8.3*  --   MG 1.6* 1.9  --   --   PHOS 4.2 3.5  --   --      CBC: Recent Labs  Lab 06/21/24 0322 06/23/24 0924  WBC 2.0* 1.6*  HGB 8.0* 8.0*  HCT 25.7* 25.6*  MCV 81.3 81.3  PLT 393 356         SIGNED:   True Atlas, MD  [facility] 06/27/2024, 10:22 AM Time taking on discharge: 50 minutes

## 2024-06-27 NOTE — Plan of Care (Signed)
  Problem: Safety: Goal: Ability to remain free from injury will improve Outcome: Progressing   Problem: Pain Managment: Goal: General experience of comfort will improve and/or be controlled Outcome: Progressing   Problem: Coping: Goal: Level of anxiety will decrease Outcome: Progressing   Problem: Activity: Goal: Risk for activity intolerance will decrease Outcome: Progressing

## 2024-06-27 NOTE — Progress Notes (Signed)
 Discharge medications delivered to patient at the bedside in a secure bag.

## 2024-06-27 NOTE — TOC Transition Note (Signed)
 Transition of Care [facility]) - Discharge Note   Patient Details  Name: Beverly [name] MRN: [id number] Date of Birth: [birth year]  Transition of Care [facility]) CM/SW Contact:  NORMAN ASPEN, LCSW Phone Number: 06/27/2024, 11:34 AM   Clinical Narrative:     Pt medically cleared for discharge today.  Confirmed again with pt that she will dc to sister's home and sister will assist with wound care.  (Spoke with sister last week and she confirms this as well.)  Unable to secure any HH agency able to accept for services.  Pt and sister aware.  No further IP CM needs.  Final next level of care: Home/Self Care Barriers to Discharge: No Home Care Agency will accept this patient, Inadequate or no insurance, Continued Medical Work up   Patient Goals and CMS Choice Patient states their goals for this hospitalization and ongoing recovery are:: return home          Discharge Placement                       Discharge Plan and Services Additional resources added to the After Visit Summary for   In-house Referral: Clinical Social Work                                   Social Drivers of Health (SDOH) Interventions SDOH Screenings   Food Insecurity: Food Insecurity Present (06/05/2024)  Housing: High Risk (06/05/2024)  Transportation Needs: No Transportation Needs (06/05/2024)  Utilities: At Risk (06/05/2024)  Depression (PHQ2-9): Medium Risk (04/06/2024)  Tobacco Use: High Risk (06/13/2024)     Readmission Risk Interventions    06/07/2024    2:41 PM 02/13/2024   10:23 AM  Readmission Risk Prevention Plan  Transportation Screening Complete Complete  PCP or Specialist Appt within 5-7 Days  Not Complete  Not Complete comments  N/A, pt has Medicaid  PCP or Specialist Appt within 3-5 Days Complete   Home Care Screening  Complete  Medication Review (RN CM)  Complete  HRI or Home Care Consult Complete   Social Work Consult for Recovery Care Planning/Counseling Complete    Palliative Care Screening Not Applicable   Medication Review Oceanographer) Complete

## 2024-07-05 ENCOUNTER — Telehealth: Payer: Self-pay | Admitting: Pharmacist

## 2024-07-05 ENCOUNTER — Other Ambulatory Visit: Payer: Self-pay

## 2024-07-05 ENCOUNTER — Other Ambulatory Visit (HOSPITAL_COMMUNITY): Payer: Self-pay

## 2024-07-05 ENCOUNTER — Ambulatory Visit (INDEPENDENT_AMBULATORY_CARE_PROVIDER_SITE_OTHER): Admitting: Infectious Diseases

## 2024-07-05 ENCOUNTER — Encounter: Payer: Self-pay | Admitting: Infectious Diseases

## 2024-07-05 VITALS — BP 106/73 | HR 98 | Temp 98.3°F | Ht 61.0 in | Wt 109.0 lb

## 2024-07-05 DIAGNOSIS — F419 Anxiety disorder, unspecified: Secondary | ICD-10-CM

## 2024-07-05 DIAGNOSIS — B20 Human immunodeficiency virus [HIV] disease: Secondary | ICD-10-CM

## 2024-07-05 DIAGNOSIS — C4492 Squamous cell carcinoma of skin, unspecified: Secondary | ICD-10-CM | POA: Diagnosis not present

## 2024-07-05 DIAGNOSIS — B3781 Candidal esophagitis: Secondary | ICD-10-CM

## 2024-07-05 DIAGNOSIS — Z221 Carrier of other intestinal infectious diseases: Secondary | ICD-10-CM

## 2024-07-05 DIAGNOSIS — A0811 Acute gastroenteropathy due to Norwalk agent: Secondary | ICD-10-CM

## 2024-07-05 DIAGNOSIS — F410 Panic disorder [episodic paroxysmal anxiety] without agoraphobia: Secondary | ICD-10-CM | POA: Diagnosis not present

## 2024-07-05 DIAGNOSIS — Z79899 Other long term (current) drug therapy: Secondary | ICD-10-CM

## 2024-07-05 MED ORDER — CABOTEGRAVIR & RILPIVIRINE ER 600 & 900 MG/3ML IM SUER
1.0000 | INTRAMUSCULAR | 1 refills | Status: AC
  Filled 2024-07-06: qty 6, 30d supply, fill #0

## 2024-07-05 MED ORDER — ONDANSETRON HCL 4 MG PO TABS: 4.0000 mg | ORAL_TABLET | Freq: Four times a day (QID) | ORAL | 2 refills | Status: AC | PRN

## 2024-07-05 MED ORDER — LORAZEPAM 0.5 MG PO TABS: 0.5000 mg | ORAL_TABLET | Freq: Two times a day (BID) | ORAL | 1 refills | Status: AC | PRN

## 2024-07-05 NOTE — Patient Instructions (Addendum)
" ° °  I think going back to the wound clinic would be most helpful for you - they do a great job.    Would like to try the cabenuva  shots for you - I think this will help you free up some of the day to day upkeep and help to make you more successful in the long run so you can improve your health.   Continue the biktarvy  everyday like you have been doing until we get your first shot arranged. Your viral load is nice and low so we want to make sure it stays that way when we switch you over.  "

## 2024-07-05 NOTE — Telephone Encounter (Signed)
 Patient is interested in starting Cabenuva . Reviewed chart, previous ART regimens, and lab work. No resistance mutations found to Cabenuva  components. She has been compliant and undetectable on Biktarvy  for the last 1-2 months but has struggled with non-adherence in the past due to mental health and other barriers. Injectable therapy should be a liberating option for her. Scheduled with me on 1/22.  Will sent rx to [facility].   Counseled that Cabenuva  is two separate intramuscular injections in the gluteal muscle on each side for each visit. Explained that the second injection is 30 days after the initial injection then every 2 months thereafter. Discussed the need for viral load monitoring every 2 months for the first 6 months and then periodically afterwards as their provider sees the need. Discussed the rare but significant chance of developing resistance despite compliance. Explained that showing up to injection appointments is very important and warned that if 2 appointments are missed, it will be reassessed by their provider whether they are a good candidate for injection therapy. Counseled on possible side effects associated with the injections such as injection site pain, which is usually mild to moderate in nature, injection site nodules, and injection site reactions. Asked to call the clinic or send me a mychart message if they experience any issues, such as fatigue, nausea, headache, rash, or dizziness. Advised that they can take ibuprofen  or tylenol  for injection site pain if needed.   Alan Geralds, PharmD, CPP, BCIDP, AAHIVP Clinical Pharmacist Practitioner Infectious Diseases Clinical Pharmacist [facility]

## 2024-07-05 NOTE — Progress Notes (Signed)
 "  Name: Beverly [name]  DOB: 1980-10-23 MRN: [id number] PCP: Melvenia Corean SAILOR, NP     Brief Narrative:  Beverly [name] is a 44 y.o. 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 Dx ~2005.  CD4 nadir < 35 HIV Risk: sexual  History of OIs:  Intake Labs : Hep B sAg (- 2010), sAb (- 2010), cAb (- 2010); Hep A (), Hep C (- 2010) Quantiferon () HLA B*5701 (-) G6PD: ()   Previous Regimens: Atripla Kaletra + Combivir  Darunavir  + Ritonavir  + Truvada   Tivicay  + Descovy  ~2018 Biktarvy     Genotypes: Previously wildtype  12-2019: pending  Subjective:   Chief Complaint  Patient presents with   Follow-up       Discussed the use of AI scribe software for clinical note transcription with the patient, who gave verbal consent to proceed.  History of Present Illness   Beverly [name] is a 44 year old female with HIV who presents for routine follow-up care and post-discharge follow-up.  She was recently hospitalized from December 15th to January 6th for recurrent unexplained fevers and was treated for MRSA and pseudomonas colonization of her abdominal wound. During her stay, she was also treated for suspected esophageal candidiasis with fluconazole , which she completed on January 12th. She continues to experience nausea since her hospital stay, requiring multiple doses of Zofran  daily. Significant weight loss is noted, with her current weight at 109 pounds, down from her desired weight of 120 pounds.  She continues on atovaquone  for prophylaxis and Biktarvy  for HIV management. Her last viral load on December 23rd was 30 copies. Genosure archive results from November 20th showed no drug-resistant mutations. She has had good ongoing adherence since being discharged home on 1/6.  She has had diarrhea - was found to have PCR positive norovirus inpatient. The diarrhea has slowed down recently to where she does not need to take the Imodium  anymore for now. She does have a lot of nausea to where she needs a  refill of zofran . Some vomiting occasionally. No more sore throat and tolerated the course of fluconazole  with recent completion of the 3 week regimen. She was also C diff antigen positive with negative PCR and toxin, indicating colonization rather than active infection.  She experiences severe anxiety and panic attacks over the past four days, characterized by heart palpitations, difficulty breathing, and crying. She was previously given Ativan  during her hospital stay, which she found effective. She also reports difficulty sleeping and has been using Ativan  at night to help with sleep.  Her abdominal wound, previously treated with a Wombat device, is healing well with daily dressing changes performed by her sister. The wound is now smaller and mostly pink and red. She experiences skin irritation from the tape used for dressings and has been using paper tape to minimize skin damage.       Review of Systems  Constitutional:  Positive for malaise/fatigue and weight loss. Negative for chills and fever.  Eyes:  Negative for blurred vision, double vision and photophobia.  Respiratory:  Negative for cough and sputum production.   Gastrointestinal:  Positive for nausea. Negative for abdominal pain and vomiting.  Skin:        Open wound with 2 tunneled areas  Neurological:  Positive for tingling (neuropathy pain). Negative for headaches.  Psychiatric/Behavioral:  Positive for depression. The patient is nervous/anxious.      Past Medical History:  Diagnosis Date   Abscess    AIDS (HCC) 03/19/2015  Anemia    Anxiety    Arthritis    Asthma    Blood transfusion without reported diagnosis    Depression    HIV infection (HCC)    Homeless 03/19/2015   states stays with family or friends. Does not rent or own a home, but does not live on streets.   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 (Patient taking differently: Take 325-650 mg by mouth daily as needed for mild pain (pain score 1-3), headache or moderate pain (pain score 4-6).)     atovaquone  (MEPRON ) 750 MG/5ML suspension Take 10 mLs (1,500 mg total) by mouth daily. 210 mL 6   bictegravir-emtricitabine -tenofovir  AF (BIKTARVY ) 50-200-25 MG TABS tablet Take 1 tablet by mouth daily. 30 tablet 3   diphenhydrAMINE  HCl (BENADRYL  ALLERGY PO) Take 25 mg by mouth daily as needed (allergies).     gabapentin  (NEURONTIN ) 100 MG capsule Take 100 mg by mouth 3 (three) times daily.     lamoTRIgine  (LAMICTAL ) 25 MG tablet Take 25 mg by mouth every morning.     loperamide  (IMODIUM  A-D) 2 MG tablet Take 2 tablets (4 mg total) by mouth 3 (three) times daily as needed for diarrhea or loose stools. 30 tablet 0   oxyCODONE  (ROXICODONE ) 5 MG immediate release tablet Take 1 tablet (5 mg total) by mouth every 8 (eight) hours as needed. (Patient taking differently: Take 5 mg by mouth every 6 (six) hours as needed for moderate pain (pain score 4-6) or severe pain (pain score 7-10).) 20 tablet 0   pregabalin  (LYRICA ) 25 MG capsule Take 1 capsule (25 mg total) by mouth 2 (two) times daily. 60 capsule 1   triamcinolone  ointment (KENALOG ) 0.1 % Apply 1 Application topically 2 (two) times daily. (Patient taking differently: Apply 1 Application topically daily.) 30 g 0   ondansetron  (ZOFRAN ) 4 MG tablet Take 1 tablet (4 mg total) by mouth every 6 (six) hours as needed for nausea. (Patient taking differently: Take 4 mg by mouth daily as needed for nausea or vomiting.) 20 tablet 0   folic acid  (FOLVITE ) 1 MG tablet Take 1 tablet (1 mg total) by mouth daily. (Patient not taking: Reported on 07/05/2024)  90 tablet 0   No facility-administered medications prior to visit.     Allergies  Allergen Reactions   Bee Venom Anaphylaxis, Shortness Of Breath and Swelling   Morphine  Hives, Itching and Other (See Comments)    And- Tolerates hydrocodone , oxycodone , and codeine with no issues   Ferrlecit  [Na Ferric Gluc Cplx In Sucrose] Swelling and Other (See Comments)    Swelling and puffiness in hands and legs after Ferrlecit  250 mg infused over 60 minutes (possibly infusion related reaction)   Latex Hives    Social History   Tobacco Use   Smoking status: Every Day    Current packs/day: 0.25    Average packs/day: 0.3 packs/day for 11.0 years (2.8 ttl pk-yrs)    Types: Cigarettes    Start date: 2015   Smokeless tobacco: Never   Tobacco comments:    5-7 a day  Vaping Use  Vaping status: Never Used  Substance Use Topics   Alcohol use: Yes    Alcohol/week: 0.0 standard drinks of alcohol    Comment: Ocassionally   Drug use: Not Currently    Frequency: 2.0 times per week    Types: Marijuana, MDMA (Ecstacy)    Family History  Problem Relation Age of Onset   Throat cancer Mother        smoker   Throat cancer Father        smoker   Hypertension Father    Cirrhosis Father    Hypertension Other    Cancer Other        smoker   Diabetes Other    Asthma Other    Colon cancer Neg Hx    Breast cancer Neg Hx    Ovarian cancer Neg Hx    Pancreatic cancer Neg Hx    Prostate cancer Neg Hx     Social History   Substance and Sexual Activity  Sexual Activity Not Currently   Partners: Male   Birth control/protection: Condom, Surgical   Comment: pt. given condoms     Objective:   Vitals:   07/05/24 1432  BP: 106/73  Pulse: 98  Temp: 98.3 F (36.8 C)  TempSrc: Oral  SpO2: 100%  Weight: 109 lb (49.4 kg)  Height: 5' 1 (1.549 m)      Body mass index is 20.6 kg/m.   Physical Exam Vitals reviewed.  Constitutional:      Appearance: Normal appearance. She is not  ill-appearing.  HENT:     Mouth/Throat:     Mouth: Mucous membranes are moist.     Pharynx: Oropharynx is clear.  Eyes:     General: No scleral icterus. Cardiovascular:     Rate and Rhythm: Normal rate.  Pulmonary:     Effort: Pulmonary effort is normal.  Skin:    General: Skin is warm and dry.     Comments: Wound assessed from photos in chart from 48 hours ago at wound care clinic visit.   Neurological:     Mental Status: She is oriented to person, place, and time.  Psychiatric:        Mood and Affect: Mood normal.        Thought Content: Thought content normal.     Lab Results Lab Results  Component Value Date   WBC 1.6 (L) 06/23/2024   HGB 8.0 (L) 06/23/2024   HCT 25.6 (L) 06/23/2024   MCV 81.3 06/23/2024   PLT 356 06/23/2024    Lab Results  Component Value Date   CREATININE 0.70 06/26/2024   BUN 7 06/25/2024   NA 134 (L) 06/25/2024   K 3.6 06/25/2024   CL 106 06/25/2024   CO2 19 (L) 06/25/2024    Lab Results  Component Value Date   ALT 6 06/21/2024   AST 16 06/21/2024   ALKPHOS 124 06/21/2024   BILITOT 0.3 06/21/2024    Lab Results  Component Value Date   CHOL 104 02/22/2024   HDL 45 (L) 02/22/2024   LDLCALC 45 02/22/2024   TRIG 65 02/22/2024   CHOLHDL 2.3 02/22/2024   HIV 1 RNA Quant (copies/mL)  Date Value  06/13/2024 30  05/11/2024 419 (H)  04/06/2024 2,100 (H)   CD4 T Cell Abs (/uL)  Date Value  05/11/2024 <35 (L)  02/22/2024 <35 (L)  11/22/2023 <35 (L)     Assessment & Plan:   Patient Active Problem List   Diagnosis Date Noted  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   Candida esophagitis (HCC) 06/27/2024   Thrush 06/17/2024   Esophageal candidiasis (HCC) 06/12/2024   Wound infection after surgery 06/05/2024   Symptomatic HIV infection (HCC) 03/27/2024   SCC (squamous cell carcinoma) 03/27/2024   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/03/2024   Sepsis due to cellulitis (HCC) 02/12/2024   Bleeding from wound 02/12/2024   Housing  insecurity 01/01/2023   Transportation insecurity 01/01/2023   Cellulitis 12/21/2022   Hypocalcemia 12/21/2022   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   Nephrolithiasis 12/21/2022   Gastroenteritis due to norovirus 01/23/2021   Hypomagnesemia 01/23/2021   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   Nonintractable headache    Generalized lymphadenopathy    Skin rash associated with AIDS (HCC) 08/27/2020   Cellulitis of abdominal wall    HIV disease (HCC)    Weight loss    Nausea & vomiting 05/25/2018   Abdominal wall skin ulcer 03/10/2018   Itching 03/10/2018   History of 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/11/2016   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   Non-healing skin lesion 05/06/2015   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 No drug-resistant mutations detected per recent and historical Genotypes, only adherence challenges. She is a candidate for Cabenuva  injections to reduce pill burden. Discussed potential side effects of Cabenuva , including soreness at the injection site. Emphasized the importance of adherence to the injection schedule to maintain therapeutic levels. Discussed the option to revert to oral medication if injections are not tolerated. She is on board with trying this.  I consulted with pharmacy as well to ensure no reservations and help get her started.  - Switched from Biktarvy  to Cabenuva  injections. - Scheduled initial loading dose and follow-up appointment in one month. -  Coordinated with pharmacy for medication switch. - Ensured transportation and appointment adherence for injections.  Candidal esophagitis, s/p treatment -  Suspected esophageal candidiasis treated with fluconazole . Symptoms have improved, and fluconazole  course is nearing completion. - Complete fluconazole  treatment as scheduled.  Gastroenteritis / nausea, likely due to norovirus in immunocompromised patient - Ongoing diarrhea due to norovirus infection. Symptoms have improved, but there is concern about clearance due to immunocompromised status. Discussed potential use of nitazoxanide if symptoms worsen /remain persistent if she can't clear on her own. Off label use but has worked for immunocompromised patients.  - Monitor symptoms and will consider nitazoxanide if diarrhea worsens.  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 Treated with Zyvox  and Ceftin during hospitalization. Wound is healing well with daily dressing changes by her sister. - Continue daily dressing changes. - Recommended she call to schedule at wound clinic for attentive care.  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  Severe anxiety and panic disorder - Severe anxiety with recent exacerbation, including panic attacks with heart palpitations and difficulty breathing. Ativan  has been effective for anxiety management. Discussed the potential benefit  of psychiatric evaluation for long-term management. Advised against using Ambien  due to addiction risk. - Prescribed Ativan  twice daily with refills. - Referred to psychiatry for evaluation and management. - Advised against using Ambien  due to addiction risk and polypharmacy  - Can use at night as well.  - [place]CRS website reviewed - no concerns   Recording duration: 30 minutes      Meds ordered  this encounter  Medications   ondansetron  (ZOFRAN ) 4 MG tablet    Sig: Take 1 tablet (4 mg total) by mouth every 6 (six) hours as needed for nausea.    Dispense:  60 tablet    Refill:  2   LORazepam  (ATIVAN ) 0.5 MG tablet    Sig: Take 1 tablet (0.5 mg total) by mouth every 12 (twelve) hours as needed for anxiety.    Dispense:  30 tablet    Refill:  1   Orders Placed This Encounter  Procedures   Ambulatory referral to Psychiatry    Referral Priority:   Routine    Referral Type:   Psychiatric    Referral Reason:   Specialty Services Required    Requested Specialty:   Psychiatry    Number of Visits Requested:   1   Scheduled for 1/22 with pharmacy for cabenuva  loading dose   I personally spent a total of 48 minutes in the care of the patient today including preparing to see the patient (10 minutes with review of hospitalization H&P, diagnostics, imaging, specialist notes and D/C summary), getting/reviewing separately obtained history, performing a medically appropriate exam/evaluation, counseling and educating, referring and communicating with other health care professionals, documenting clinical information in the EHR (4 minutes), independently interpreting results, coordinating care, and face to face meeting with her of 30 minutes   Corean Fireman, MSN, NP-C [facility]  [email or web address] Pager: [phone] Office: [phone] [facility] Main Line: [phone] *Secure Chat Communication Welcome      "

## 2024-07-06 ENCOUNTER — Other Ambulatory Visit: Payer: Self-pay

## 2024-07-06 LAB — FUNGUS CULTURE RESULT

## 2024-07-06 LAB — FUNGUS CULTURE WITH STAIN

## 2024-07-06 LAB — FUNGAL ORGANISM REFLEX

## 2024-07-06 NOTE — Progress Notes (Signed)
 Specialty Pharmacy Initial Fill Coordination Note  Beverly [name] is a 44 y.o. female contacted today regarding initial fill of specialty medication(s) Cabotegravir  & Rilpivirine  (CABENUVA )   Patient requested Courier to Provider Office   Delivery date: 07/10/24   Verified address: [address] [place] [address]   Medication will be filled on 07/07/24.   Patient is aware of 0.00 copayment.

## 2024-07-07 ENCOUNTER — Other Ambulatory Visit: Payer: Self-pay

## 2024-07-10 ENCOUNTER — Telehealth: Payer: Self-pay

## 2024-07-10 NOTE — Telephone Encounter (Signed)
 [name] Advocate Encounter  [name] medications Cabenuva  have been couriered to [facility] from [facility] and will be administered at the patients appointment on 07/13/24.  Arland Hutchinson, CPhT Specialty Pharmacy Patient [facility] for Infectious Disease Phone: [phone] Fax:  [phone]

## 2024-07-12 NOTE — Progress Notes (Unsigned)
 "  HPI: Beverly [name] is a 44 y.o. female who presents to the [facility] pharmacy clinic for their first set of Cabenuva  injections for HIV treatment.  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   Candida esophagitis (HCC) 06/27/2024   Thrush 06/17/2024   Esophageal candidiasis (HCC) 06/12/2024   Wound infection after surgery 06/05/2024   Symptomatic HIV infection (HCC) 03/27/2024   SCC (squamous cell carcinoma) 03/27/2024   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/03/2024   Sepsis due to cellulitis (HCC) 02/12/2024   Bleeding from wound 02/12/2024   Housing insecurity 01/01/2023   Transportation insecurity 01/01/2023   Cellulitis 12/21/2022   Hypocalcemia 12/21/2022   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   Nephrolithiasis 12/21/2022   Gastroenteritis due to norovirus 01/23/2021   Hypomagnesemia 01/23/2021   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   Nonintractable headache    Generalized lymphadenopathy    Skin rash associated with AIDS (HCC) 08/27/2020   Cellulitis of abdominal wall    HIV disease (HCC)    Weight loss    Nausea & vomiting 05/25/2018   Abdominal wall skin ulcer 03/10/2018   Itching 03/10/2018   History of 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/11/2016   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   Non-healing skin lesion 05/06/2015   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   ATOVAQUONE  (MEPRON ) 750 MG/5ML SUSPENSION    Take 10 mLs (1,500 mg total) by mouth daily.   BICTEGRAVIR-EMTRICITABINE -TENOFOVIR  AF (BIKTARVY ) 50-200-25 MG TABS TABLET    Take 1 tablet by mouth daily.   CABOTEGRAVIR  & RILPIVIRINE  ER (CABENUVA ) 600 & 900 MG/3ML INJECTION    Inject 1 kit into the muscle every 30 (thirty) days.   DIPHENHYDRAMINE  HCL (BENADRYL  ALLERGY PO)    Take 25 mg by mouth daily as needed (allergies).   FOLIC ACID  (FOLVITE ) 1 MG TABLET    Take 1 tablet (1 mg total) by mouth daily.   GABAPENTIN  (NEURONTIN ) 100 MG CAPSULE    Take 100 mg by mouth 3 (three) times daily.   LAMOTRIGINE  (LAMICTAL ) 25 MG TABLET    Take 25 mg by mouth every morning.   LOPERAMIDE  (IMODIUM  A-D) 2 MG TABLET    Take 2 tablets (4 mg total) by mouth 3 (three) times daily as needed for diarrhea or loose stools.   LORAZEPAM  (ATIVAN ) 0.5 MG TABLET    Take 1 tablet (0.5 mg total) by mouth every 12 (twelve) hours as needed for anxiety.   ONDANSETRON  (ZOFRAN ) 4 MG TABLET    Take 1 tablet (4 mg total) by mouth every 6 (six) hours as needed for nausea.   OXYCODONE  (ROXICODONE ) 5 MG IMMEDIATE RELEASE TABLET    Take 1 tablet (5 mg total) by mouth every 8 (eight) hours as needed.   PREGABALIN  (LYRICA ) 25 MG CAPSULE    Take 1  capsule (25 mg total) by mouth 2 (two) times daily.   TRIAMCINOLONE  OINTMENT (KENALOG ) 0.1 %    Apply 1 Application topically 2 (two) times daily.  Modified Medications   No medications on file  Discontinued Medications   No medications on file    Allergies: Allergies[1]  Labs: Lab Results  Component Value Date   HIV1RNAQUANT 30 06/13/2024   HIV1RNAQUANT 419 (H) 05/11/2024   HIV1RNAQUANT 2,100 (H) 04/06/2024   CD4TABS <35 (L) 05/11/2024   CD4TABS <35 (L) 02/22/2024   CD4TABS <35 (L) 11/22/2023    RPR and STI Lab Results  Component Value Date   LABRPR NON REACTIVE 02/13/2024   LABRPR NON-REACTIVE 02/25/2022    LABRPR NON REACTIVE 01/23/2021   LABRPR Reactive (A) 08/27/2020   LABRPR NON-REACTIVE 12/26/2019    STI Results GC CT  02/22/2024 11:30 AM Negative    Negative  Negative    Negative   02/25/2022 10:13 AM Negative  Negative   08/27/2020 10:59 AM Negative  Negative   12/06/2019 11:05 PM Negative  Negative   04/05/2019 12:00 AM Negative  Negative   03/10/2018 12:00 AM Negative  Negative   06/10/2016 12:00 AM Negative  Negative   03/05/2015 12:00 AM Negative  Negative     Hepatitis B Lab Results  Component Value Date   HEPBSAB NON-REACTIVE 02/22/2024   HEPBSAG NON-REACTIVE 10/19/2022   HEPBCAB NON REACTIVE 08/27/2020   Hepatitis C Lab Results  Component Value Date   HEPCAB NON-REACTIVE 02/22/2024   Hepatitis A Lab Results  Component Value Date   HAV REACTIVE (A) 02/22/2024   Lipids: Lab Results  Component Value Date   CHOL 104 02/22/2024   TRIG 65 02/22/2024   HDL 45 (L) 02/22/2024   CHOLHDL 2.3 02/22/2024   VLDL 11 06/10/2016   LDLCALC 45 02/22/2024    Current HIV Regimen: Biktarvy   TARGET DATE: 22nd  Assessment: [name] presents today for her first injections of Cabenuva  for HIV treatment. Counseled that Cabenuva  is two separate intramuscular injections in the gluteal muscle on each side for each visit. Explained that the second injection is 30 days after the initial injection then every 2 months thereafter. Discussed the rare but significant chance of developing resistance despite compliance. Explained that showing up to injection appointments is very important and warned that if 2 appointments are missed, it will be reassessed by their provider whether they are a good candidate for injection therapy. Counseled on possible side effects associated with the injections such as injection site pain, which is usually mild to moderate in nature, injection site nodules, and injection site reactions. Asked to call the clinic or send me a mychart message if they experience  any issues, such as fatigue, nausea, headache, rash, or dizziness. Advised that they can take ibuprofen  or tylenol  for injection site pain if needed.   Administered cabotegravir  600mg /103mL in left upper outer quadrant of the gluteal muscle. Administered rilpivirine  900 mg/3mL in the right upper outer quadrant of the gluteal muscle. Monitored patient for 10 minutes after injection. Injections were tolerated well without issue. Counseled to stop taking Biktarvy  after today's dose and to call with any issues that may arise. Will make follow up appointments for second initiation injection in 30 days and then maintenance injections every 2 months thereafter.   Plan: - Stop Biktarvy  after today's dose - First Cabenuva  injections administered - Second set of initiation injections scheduled for *** - Maintenance injections scheduled for *** - Call with any issues or questions  Maurilio Patten, PharmD PGY1 Pharmacy Resident [facility] 07/12/2024 7:01 PM    [1]  Allergies Allergen Reactions   Bee Venom Anaphylaxis, Shortness Of Breath and Swelling   Morphine  Hives, Itching and Other (See Comments)    And- Tolerates hydrocodone , oxycodone , and codeine with no issues   Ferrlecit  [Na Ferric Gluc Cplx In Sucrose] Swelling and Other (See Comments)    Swelling and puffiness in hands and legs after Ferrlecit  250 mg infused over 60 minutes (possibly infusion related reaction)   Latex Hives   "

## 2024-07-13 ENCOUNTER — Encounter (HOSPITAL_COMMUNITY): Payer: Self-pay

## 2024-07-13 ENCOUNTER — Other Ambulatory Visit: Payer: Self-pay

## 2024-07-13 ENCOUNTER — Ambulatory Visit: Admitting: Pharmacist

## 2024-07-13 ENCOUNTER — Emergency Department (HOSPITAL_COMMUNITY)

## 2024-07-13 ENCOUNTER — Ambulatory Visit

## 2024-07-13 ENCOUNTER — Emergency Department (HOSPITAL_COMMUNITY)
Admission: EM | Admit: 2024-07-13 | Discharge: 2024-07-13 | Disposition: A | Discharge status: Home / Self Care | Attending: Emergency Medicine | Admitting: Emergency Medicine

## 2024-07-13 DIAGNOSIS — J45909 Unspecified asthma, uncomplicated: Secondary | ICD-10-CM | POA: Insufficient documentation

## 2024-07-13 DIAGNOSIS — Z21 Asymptomatic human immunodeficiency virus [HIV] infection status: Secondary | ICD-10-CM | POA: Diagnosis not present

## 2024-07-13 DIAGNOSIS — Z5900 Homelessness unspecified: Secondary | ICD-10-CM | POA: Insufficient documentation

## 2024-07-13 DIAGNOSIS — Z91040 Latex allergy status: Secondary | ICD-10-CM | POA: Insufficient documentation

## 2024-07-13 DIAGNOSIS — L03311 Cellulitis of abdominal wall: Secondary | ICD-10-CM | POA: Insufficient documentation

## 2024-07-13 DIAGNOSIS — L089 Local infection of the skin and subcutaneous tissue, unspecified: Secondary | ICD-10-CM | POA: Diagnosis present

## 2024-07-13 LAB — URINALYSIS, ROUTINE W REFLEX MICROSCOPIC
Bilirubin Urine: NEGATIVE
Glucose, UA: NEGATIVE mg/dL
Hgb urine dipstick: NEGATIVE
Ketones, ur: NEGATIVE mg/dL
Leukocytes,Ua: NEGATIVE
Nitrite: NEGATIVE
Protein, ur: 30 mg/dL — AB
Specific Gravity, Urine: 1.019 (ref 1.005–1.030)
pH: 6 (ref 5.0–8.0)

## 2024-07-13 LAB — COMPREHENSIVE METABOLIC PANEL WITH GFR
ALT: 12 U/L (ref 0–44)
AST: 30 U/L (ref 15–41)
Albumin: 2.9 g/dL — ABNORMAL LOW (ref 3.5–5.0)
Alkaline Phosphatase: 159 U/L — ABNORMAL HIGH (ref 38–126)
Anion gap: 7 (ref 5–15)
BUN: 13 mg/dL (ref 6–20)
CO2: 23 mmol/L (ref 22–32)
Calcium: 8.6 mg/dL — ABNORMAL LOW (ref 8.9–10.3)
Chloride: 110 mmol/L (ref 98–111)
Creatinine, Ser: 0.76 mg/dL (ref 0.44–1.00)
GFR, Estimated: >60 mL/min
Glucose, Bld: 73 mg/dL (ref 70–99)
Potassium: 4.2 mmol/L (ref 3.5–5.1)
Sodium: 140 mmol/L (ref 135–145)
Total Bilirubin: 0.3 mg/dL (ref 0.0–1.2)
Total Protein: 8.1 g/dL (ref 6.5–8.1)

## 2024-07-13 LAB — LIPASE, BLOOD: Lipase: 258 U/L — ABNORMAL HIGH (ref 11–51)

## 2024-07-13 LAB — CBC
HCT: 23.1 % — ABNORMAL LOW (ref 36.0–46.0)
Hemoglobin: 7 g/dL — ABNORMAL LOW (ref 12.0–15.0)
MCH: 24.9 pg — ABNORMAL LOW (ref 26.0–34.0)
MCHC: 30.3 g/dL (ref 30.0–36.0)
MCV: 82.2 fL (ref 80.0–100.0)
Platelets: 464 K/uL — ABNORMAL HIGH (ref 150–400)
RBC: 2.81 MIL/uL — ABNORMAL LOW (ref 3.87–5.11)
RDW: 19.7 % — ABNORMAL HIGH (ref 11.5–15.5)
WBC: 2.2 K/uL — ABNORMAL LOW (ref 4.0–10.5)
nRBC: 0 % (ref 0.0–0.2)

## 2024-07-13 LAB — HCG, SERUM, QUALITATIVE: Preg, Serum: NEGATIVE

## 2024-07-13 MED ORDER — TRAMADOL HCL 50 MG PO TABS
50.0000 mg | ORAL_TABLET | Freq: Once | ORAL | Status: AC
  Administered 2024-07-13: 50 mg via ORAL
  Filled 2024-07-13: qty 1

## 2024-07-13 MED ORDER — SULFAMETHOXAZOLE-TRIMETHOPRIM 800-160 MG PO TABS: 1.0000 | ORAL_TABLET | Freq: Two times a day (BID) | ORAL | 0 refills | Status: AC

## 2024-07-13 MED ORDER — CEPHALEXIN 500 MG PO CAPS
500.0000 mg | ORAL_CAPSULE | Freq: Once | ORAL | Status: AC
  Administered 2024-07-13: 500 mg via ORAL
  Filled 2024-07-13: qty 1

## 2024-07-13 MED ORDER — SULFAMETHOXAZOLE-TRIMETHOPRIM 800-160 MG PO TABS
1.0000 | ORAL_TABLET | Freq: Once | ORAL | Status: AC
  Administered 2024-07-13: 1 via ORAL
  Filled 2024-07-13: qty 1

## 2024-07-13 MED ORDER — CEPHALEXIN 500 MG PO CAPS: 500.0000 mg | ORAL_CAPSULE | Freq: Four times a day (QID) | ORAL | 0 refills | Status: AC

## 2024-07-13 MED ORDER — IOHEXOL 300 MG/ML  SOLN
100.0000 mL | Freq: Once | INTRAMUSCULAR | Status: AC | PRN
  Administered 2024-07-13: 100 mL via INTRAVENOUS

## 2024-07-13 NOTE — Discharge Instructions (Addendum)
 I have sent you prescriptions for Keflex  and Bactrim .  These are antibiotics that you need to take as prescribed for the next 1 week.  Like we discussed, is very importantly return to the emergency room if you develop fever, develop worsening pain in your abdomen, or feel as though your symptoms are getting worse.  You should begin to notice an improvement in the next 24 to 48 hours.  Follow-up with your surgeon next week.  Call your primary care doctor for a follow-up appointment within the next 2 weeks.

## 2024-07-13 NOTE — ED Triage Notes (Signed)
 Pt came in for severe abdominal pain. Pt recently had surgery on December 3rd that got infected. Pt was admitted for antibiotics and has been trying to heal. However, she is getting breakdown above her incision that will cause swelling and is radiating towards the open wound.

## 2024-07-13 NOTE — ED Provider Notes (Signed)
 " [place] EMERGENCY DEPARTMENT AT [facility] Provider Note  CSN: [id number] Arrival date & time: 07/13/24 1018  Chief Complaint(s) Abdominal Pain and Wound Check  HPI Beverly [name] is a 44 y.o. female who is here today for worsening abdominal wall pain.  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  She was hospitalized, and briefly had a wound VAC, discharged with antibiotics.  She has a history of HIV, is following with infectious diseases and taking her Biktarvy .  She is currently being treated with atovaquone .  Past Medical History Past Medical History:  Diagnosis Date   Abscess    AIDS (HCC) 03/19/2015   Anemia    Anxiety    Arthritis    Asthma    Blood transfusion without reported diagnosis    Depression    HIV infection (HCC)    Homeless 03/19/2015   states stays with family or friends. Does not rent or own a home, but does not live on streets.   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   Candida esophagitis (HCC) 06/27/2024   Thrush 06/17/2024   Esophageal candidiasis (HCC) 06/12/2024   Wound infection after surgery 06/05/2024   Symptomatic HIV infection (HCC) 03/27/2024   SCC (squamous cell carcinoma) 03/27/2024   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/03/2024   Sepsis due to cellulitis (HCC) 02/12/2024   Bleeding from wound 02/12/2024   Housing insecurity 01/01/2023   Transportation insecurity 01/01/2023   Cellulitis 12/21/2022   Hypocalcemia 12/21/2022   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   Nephrolithiasis 12/21/2022   Gastroenteritis due to norovirus 01/23/2021   Hypomagnesemia 01/23/2021   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   Nonintractable headache    Generalized lymphadenopathy    Skin rash associated with AIDS (HCC) 08/27/2020   Cellulitis of abdominal wall    HIV disease (HCC)    Weight loss    Nausea & vomiting 05/25/2018   Abdominal wall skin ulcer 03/10/2018   Itching 03/10/2018   History of 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/11/2016   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 Authorizing Provider  cephALEXin  (KEFLEX ) 500 MG capsule Take 1 capsule (500 mg total) by mouth 4 (four) times daily. 07/13/24  Yes Mannie Pac T, DO  sulfamethoxazole -trimethoprim  (BACTRIM  DS) 800-160 MG tablet Take 1 tablet by mouth 2 (two) times daily for 7 days. 07/13/24 07/20/24 Yes Mannie Pac T, DO  acetaminophen  (TYLENOL ) 325 MG tablet Take 2 tablets (650 mg total) by mouth every 6 (six) hours as needed for mild pain, fever or headache. Patient taking differently: Take 325-650 mg by mouth daily as needed for mild pain (pain score 1-3), headache or moderate pain (pain score 4-6). 12/24/22  Danton Reyes DASEN, MD  atovaquone  (MEPRON ) 750 MG/5ML suspension Take 10 mLs (1,500 mg total) by mouth daily. 03/16/24   Melvenia Corean SAILOR, NP  bictegravir-emtricitabine -tenofovir  AF (BIKTARVY ) 50-200-25 MG TABS tablet Take 1 tablet by mouth daily. 02/22/24   Dennise Kingsley, MD  cabotegravir  & rilpivirine  ER (CABENUVA ) 600 & 900 MG/3ML injection Inject 1 kit into the muscle every 30 (thirty) days. 07/05/24   Waddell Alan PARAS,  RPH-CPP  diphenhydrAMINE  HCl (BENADRYL  ALLERGY PO) Take 25 mg by mouth daily as needed (allergies).    [provider]  folic acid  (FOLVITE ) 1 MG tablet Take 1 tablet (1 mg total) by mouth daily. Patient not taking: Reported on 07/05/2024 06/15/24   Krishnan, Gokul, MD  gabapentin  (NEURONTIN ) 100 MG capsule Take 100 mg by mouth 3 (three) times daily. 05/29/24 05/29/25  [provider]  lamoTRIgine  (LAMICTAL ) 25 MG tablet Take 25 mg by mouth every morning. 05/23/24   [provider]  loperamide  (IMODIUM  A-D) 2 MG tablet Take 2 tablets (4 mg total) by mouth 3 (three) times daily as needed for diarrhea or loose stools. 06/27/24   Rizwan, Saima, MD  LORazepam  (ATIVAN ) 0.5 MG tablet Take 1 tablet (0.5 mg total) by mouth every 12 (twelve) hours as needed for anxiety. 07/05/24   Melvenia Corean SAILOR, NP  ondansetron  (ZOFRAN ) 4 MG tablet Take 1 tablet (4 mg total) by mouth every 6 (six) hours as needed for nausea. 07/05/24   Melvenia Corean SAILOR, NP  oxyCODONE  (ROXICODONE ) 5 MG immediate release tablet Take 1 tablet (5 mg total) by mouth every 8 (eight) hours as needed. Patient taking differently: Take 5 mg by mouth every 6 (six) hours as needed for moderate pain (pain score 4-6) or severe pain (pain score 7-10). 05/24/24 05/24/25  Curvin Mt III, MD  pregabalin  (LYRICA ) 25 MG capsule Take 1 capsule (25 mg total) by mouth 2 (two) times daily. 03/16/24   Melvenia Corean SAILOR, NP  triamcinolone  ointment (KENALOG ) 0.1 % Apply 1 Application topically 2 (two) times daily. Patient taking differently: Apply 1 Application topically daily. 05/22/24   Eldonna Mays, MD                                                                                                                                    Past Surgical History Past Surgical History:  Procedure Laterality Date   BIOPSY OF SKIN SUBCUTANEOUS TISSUE AND/OR MUCOUS MEMBRANE N/A 02/14/2024   Procedure: Incisional Biopsy of Abdominal Wall Wound;   Surgeon: Curvin Mt MOULD, MD;  Location: [facility];  Service: General;  Laterality: N/A;   CERVICAL CONIZATION W/BX N/A 04/14/2018   Procedure: COLD KNIFE CONIZATION CERVIX WITH BIOPSY;  Surgeon: Anitra Freddy NOVAK, MD;  Location: [facility];  Service: Gynecology;  Laterality: N/A;   CESAREAN SECTION     DILATION AND CURETTAGE OF UTERUS N/A 04/14/2018   Procedure: ENDOCERVICAL CURETTAGE;  Surgeon: Anitra Freddy NOVAK,  MD;  Location: [facility];  Service: Gynecology;  Laterality: N/A;   ESOPHAGOGASTRODUODENOSCOPY N/A 06/13/2024   Procedure: EGD (ESOPHAGOGASTRODUODENOSCOPY);  Surgeon: Saintclair Jasper, MD;  Location: [facility];  Service: Gastroenterology;  Laterality: N/A;   EXAM UNDER ANESTHESIA, PELVIC N/A 05/31/2024   Procedure: EXAM UNDER ANESTHESIA, PELVIC;  Surgeon: Rogelio Planas, MD;  Location: [facility];  Service: Gynecology;  Laterality: N/A;   EXCISION MASS ABDOMINAL N/A 05/24/2024   Procedure: EXCISION, MASS, TORSO;  Surgeon: Curvin Deward MOULD, MD;  Location: [facility];  Service: General;  Laterality: N/A;  EXCISION SQUAMOUS ELL CANCER LOWER ABDOMINAL WALL   INCISION AND DRAINAGE ABSCESS Right 01/10/2016   Procedure: INCISION AND DRAINAGE RIGHT BUTTOCK, LEFT LABIAL , EXCISION AND DRAINAGE OF  RIGHT AXILLARY ABSCESS;  Surgeon: Morene Olives, MD;  Location: [facility];  Service: General;  Laterality: Right;   INCISION AND DRAINAGE ABSCESS Bilateral 06/05/2024   Procedure: INCISION AND DRAINAGE OF ABDOMINAL WALL ASBCESS;  Surgeon: Dasie Leonor CROME, MD;  Location: [facility];  Service: General;  Laterality: Bilateral;   LEEP  05/31/2024   Procedure: LEEP (LOOP ELECTROSURGICAL EXCISION PROCEDURE);  Surgeon: Rogelio Planas, MD;  Location: [facility];  Service: Gynecology;;   TUBAL LIGATION  2005   VULVA MARYBETH BIOPSY N/A 04/14/2018   Procedure: VULVAR BIOPSIES;  Surgeon: Anitra Freddy NOVAK, MD;  Location: [facility];  Service: Gynecology;  Laterality: N/A;    Family History Family History  Problem Relation Age of Onset   Throat cancer Mother        smoker   Throat cancer Father        smoker   Hypertension Father    Cirrhosis Father    Hypertension Other    Cancer Other        smoker   Diabetes Other    Asthma Other    Colon cancer Neg Hx    Breast cancer Neg Hx    Ovarian cancer Neg Hx    Pancreatic cancer Neg Hx    Prostate cancer Neg Hx     Social History Social History[1] Allergies Bee venom, Morphine , Ferrlecit  [na ferric gluc cplx in sucrose], and Latex  Review of Systems Review of Systems  Physical Exam Vital Signs  I have reviewed the triage vital signs BP (!) 118/91   Pulse 88   Temp 98.1 F (36.7 C) (Oral)   Resp 16   SpO2 100%   Physical Exam Vitals and nursing note reviewed.  Abdominal:     General: Abdomen is flat.     Palpations: Abdomen is soft.  Skin:    Comments: Lower midline abdominal incision is well-appearing, no pus or purulence.  Below the umbilicus, there is some indurated tissue, warm to the touch.  There is no crepitus.  1 small skin ulceration where the patient had had some tape previously.  Neurological:     Mental Status: She is alert.     ED Results and Treatments Labs (all labs ordered are listed, but only abnormal results are displayed) Labs Reviewed  LIPASE, BLOOD - Abnormal; Notable for the following components:      Result Value   Lipase 258 (*)    All other components within normal limits  COMPREHENSIVE METABOLIC PANEL WITH GFR - Abnormal; Notable for the following components:   Calcium  8.6 (*)    Albumin 2.9 (*)    Alkaline Phosphatase 159 (*)    All other components within normal limits  URINALYSIS, ROUTINE W REFLEX MICROSCOPIC -  Abnormal; Notable for the following components:   Protein, ur 30 (*)    Bacteria, UA RARE (*)    All other components within normal limits  CBC - Abnormal; Notable for the following components:   WBC 2.2 (*)    RBC 2.81 (*)     Hemoglobin 7.0 (*)    HCT 23.1 (*)    MCH 24.9 (*)    RDW 19.7 (*)    Platelets 464 (*)    All other components within normal limits  HCG, SERUM, QUALITATIVE                                                                                                                          Radiology CT ABDOMEN PELVIS W CONTRAST Result Date: 07/13/2024 CLINICAL DATA:  Postop abdominal pain, wound check EXAM: CT ABDOMEN AND PELVIS WITH CONTRAST TECHNIQUE: Multidetector CT imaging of the abdomen and pelvis was performed using the standard protocol following bolus administration of intravenous contrast. RADIATION DOSE REDUCTION: This exam was performed according to the departmental dose-optimization program which includes automated exposure control, adjustment of the mA and/or kV according to patient size and/or use of iterative reconstruction technique. CONTRAST:  OMNIPAQUE  IOHEXOL  300 MG/ML  SOLN COMPARISON:  CT 06/11/2024, 02/12/2024, 11/22/2023, pelvis MRI 08/27/2020 FINDINGS: Lower chest: Lung bases demonstrate no acute airspace disease. Hepatobiliary: No focal liver abnormality is seen. No gallstones, gallbladder wall thickening, or biliary dilatation. Pancreas: Unremarkable. No pancreatic ductal dilatation or surrounding inflammatory changes. Spleen: Normal in size without focal abnormality. Adrenals/Urinary Tract: Adrenal glands are normal. Kidneys show no hydronephrosis. Multiple nonobstructing bilateral kidney stones. The bladder is decompressed but diffusely thick walled. 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 This measures 9 mm maximum thickness. Enhancement of the periurethral soft tissues. Stomach/Bowel: Stomach within normal limits. Some fluid-filled nondilated small bowel. No acute bowel wall thickening Vascular/Lymphatic: Nonaneurysmal aorta. Small retroperitoneal lymph nodes. Prominent right inguinal nodes measuring up to 10  mm. Reproductive: No suspicious adnexal mass. Skin thickening of the vulva without evidence for soft tissue emphysema or rim enhancing collection. Soft tissue infiltration of the pubic fat, increased compared to prior. Other: Low ventral wound. Since the most recent prior, increased diffuse skin thickening and subcutaneous infiltration of the infraumbilical abdominal wall superior to the wound. No rim enhancing fluid collection to suggest drainable abscess. Musculoskeletal: No acute osseous abnormality IMPRESSION: 1. Low ventral wound with increased diffuse skin thickening and subcutaneous infiltration/stranding of the infraumbilical abdominal wall superior to the wound, suspect for cellulitis. No rim enhancing fluid collection to suggest drainable abscess. This extends caudally and involves the pubic soft tissues as well. 2. Skin thickening and subcutaneous infiltration of the mons pubis suspect for cellulitis. Skin thickening of the labia without evidence for soft tissue emphysema or rim enhancing collection. Decreased ulcerated appearance of the labia and perineum compared to the prior CT. 3. 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 There is enhancement of the periurethral soft tissues, which could be inflammatory, correlate for any signs or symptoms of urinary tract infection 4. Diffuse bladder wall thickening, under distension versus cystitis 5. Multiple nonobstructing bilateral kidney stones. Electronically Signed   By: Luke Bun M.D.   On: 07/13/2024 16:44    Pertinent labs & imaging results that were available during my care of the patient were reviewed by me and considered in my medical decision making (see MDM for details).  Medications Ordered in ED Medications  iohexol  (OMNIPAQUE ) 300 MG/ML solution 100 mL (100 mLs Intravenous Contrast Given 07/13/24 1550)  sulfamethoxazole -trimethoprim  (BACTRIM  DS) 800-160 MG per tablet 1  tablet (1 tablet Oral Given 07/13/24 1734)  cephALEXin  (KEFLEX ) capsule 500 mg (500 mg Oral Given 07/13/24 1734)  traMADol  (ULTRAM ) tablet 50 mg (50 mg Oral Given 07/13/24 1734)                                                                                                                                     Procedures Procedures  (including critical care time)  Medical Decision Making / ED Course   This patient presents to the ED for concern of abdominal wall pain, this involves an extensive number of treatment options, and is a complaint that carries with it a high risk of complications and morbidity.  The differential diagnosis includes cellulitis,, abdominal wall abscess.  MDM: Patient with HIV, currently taking antivirals, however still immunocompromise.  Her exam somewhat reassuring.  Surgical wound does not appear acutely infected.  She does appear to have a cellulitis superior to this area.  CT imaging consistent with physical exam, identified cellulitis without obvious abscess.  Patient does have a drop in her hemoglobin over the last few weeks.  She has had issues with anemia previously.  I discussion with the patient about her findings.  She is comfortable with trying outpatient management for her cellulitis.  I reviewed her recent micros, her MRSA infection was susceptible to Bactrim .  Will start her on Bactrim  and Keflex .  She follows with her general surgeon next week.  Discussed return precautions with the patient at bedside, advised her to monitor over the next 2 days and return to the ED if she feels as though she is worsening or not having improvement.  She is agreeable with this plan.   Additional history obtained:  -External records from outside source obtained and reviewed including: Chart review including previous notes, labs, imaging, consultation notes   Lab Tests: -I ordered, reviewed, and interpreted labs.   The pertinent results include:   Labs Reviewed   LIPASE, BLOOD - Abnormal; Notable for the following components:      Result Value   Lipase 258 (*)    All other components within normal limits  COMPREHENSIVE METABOLIC PANEL WITH GFR - Abnormal; Notable for the following components:   Calcium  8.6 (*)  Albumin 2.9 (*)    Alkaline Phosphatase 159 (*)    All other components within normal limits  URINALYSIS, ROUTINE W REFLEX MICROSCOPIC - Abnormal; Notable for the following components:   Protein, ur 30 (*)    Bacteria, UA RARE (*)    All other components within normal limits  CBC - Abnormal; Notable for the following components:   WBC 2.2 (*)    RBC 2.81 (*)    Hemoglobin 7.0 (*)    HCT 23.1 (*)    MCH 24.9 (*)    RDW 19.7 (*)    Platelets 464 (*)    All other components within normal limits  HCG, SERUM, QUALITATIVE       Imaging Studies ordered: I ordered imaging studies including CT abdomen pelvis I independently visualized and interpreted imaging. I agree with the radiologist interpretation   Medicines ordered and prescription drug management: Meds ordered this encounter  Medications   iohexol  (OMNIPAQUE ) 300 MG/ML solution 100 mL   sulfamethoxazole -trimethoprim  (BACTRIM  DS) 800-160 MG per tablet 1 tablet   cephALEXin  (KEFLEX ) capsule 500 mg   traMADol  (ULTRAM ) tablet 50 mg   cephALEXin  (KEFLEX ) 500 MG capsule    Sig: Take 1 capsule (500 mg total) by mouth 4 (four) times daily.    Dispense:  28 capsule    Refill:  0   sulfamethoxazole -trimethoprim  (BACTRIM  DS) 800-160 MG tablet    Sig: Take 1 tablet by mouth 2 (two) times daily for 7 days.    Dispense:  14 tablet    Refill:  0    -I have reviewed the patients home medicines and have made adjustments as needed  Cardiac Monitoring: The patient was maintained on a cardiac monitor.  I personally viewed and interpreted the cardiac monitored which showed an underlying rhythm of: Normal sinus rhythm  Social Determinants of Health:  Factors impacting patients care  include: Medical comorbidities including HIV   Reevaluation: After the interventions noted above, I reevaluated the patient and found that they have :improved  Co morbidities that complicate the patient evaluation  Past Medical History:  Diagnosis Date   Abscess    AIDS (HCC) 03/19/2015   Anemia    Anxiety    Arthritis    Asthma    Blood transfusion without reported diagnosis    Depression    HIV infection (HCC)    Homeless 03/19/2015   states stays with family or friends. Does not rent or own a home, but does not live on streets.   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  She will return to the ED for worsening symptoms.     Final Clinical Impression(s) / ED Diagnoses Final diagnoses:  Cellulitis, abdominal wall     @PCDICTATION @     [1]  Social History Tobacco Use   Smoking status: Every Day    Current packs/day: 0.25    Average packs/day: 0.3 packs/day for 11.1 years (2.8 ttl pk-yrs)    Types: Cigarettes    Start date: 2015   Smokeless tobacco: Never   Tobacco comments:    5-7 a day  Vaping Use   Vaping status: Never Used  Substance  Use Topics   Alcohol use: Yes    Alcohol/week: 0.0 standard drinks of alcohol    Comment: Ocassionally   Drug use: Not Currently    Frequency: 2.0 times per week    Types: Marijuana, MDMA (Ecstacy)     Mannie Pac T, DO 07/13/24 1754  "

## 2024-07-18 NOTE — Progress Notes (Unsigned)
 "  HPI: NIKAELA COYNE is a 44 y.o. female who presents to the [facility] pharmacy clinic for their first set of Cabenuva  injections for HIV treatment.  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   Candida esophagitis (HCC) 06/27/2024   Thrush 06/17/2024   Esophageal candidiasis (HCC) 06/12/2024   Wound infection after surgery 06/05/2024   Symptomatic HIV infection (HCC) 03/27/2024   SCC (squamous cell carcinoma) 03/27/2024   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/03/2024   Sepsis due to cellulitis (HCC) 02/12/2024   Bleeding from wound 02/12/2024   Housing insecurity 01/01/2023   Transportation insecurity 01/01/2023   Cellulitis 12/21/2022   Hypocalcemia 12/21/2022   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   Nephrolithiasis 12/21/2022   Gastroenteritis due to norovirus 01/23/2021   Hypomagnesemia 01/23/2021   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   Nonintractable headache    Generalized lymphadenopathy    Skin rash associated with AIDS (HCC) 08/27/2020   Cellulitis of abdominal wall    HIV disease (HCC)    Weight loss    Nausea & vomiting 05/25/2018   Abdominal wall skin ulcer 03/10/2018   Itching 03/10/2018   History of 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/11/2016   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   Non-healing skin lesion 05/06/2015   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   ATOVAQUONE  (MEPRON ) 750 MG/5ML SUSPENSION    Take 10 mLs (1,500 mg total) by mouth daily.   BICTEGRAVIR-EMTRICITABINE -TENOFOVIR  AF (BIKTARVY ) 50-200-25 MG TABS TABLET    Take 1 tablet by mouth daily.   CABOTEGRAVIR  & RILPIVIRINE  ER (CABENUVA ) 600 & 900 MG/3ML INJECTION    Inject 1 kit into the muscle every 30 (thirty) days.   CEPHALEXIN  (KEFLEX ) 500 MG CAPSULE    Take 1 capsule (500 mg total) by mouth 4 (four) times daily.   DIPHENHYDRAMINE  HCL (BENADRYL  ALLERGY PO)    Take 25 mg by mouth daily as needed (allergies).   FOLIC ACID  (FOLVITE ) 1 MG TABLET    Take 1 tablet (1 mg total) by mouth daily.   GABAPENTIN  (NEURONTIN ) 100 MG CAPSULE    Take 100 mg by mouth 3 (three) times daily.   LAMOTRIGINE  (LAMICTAL ) 25 MG TABLET    Take 25 mg by mouth every morning.   LOPERAMIDE  (IMODIUM  A-D) 2 MG TABLET    Take 2 tablets (4 mg total) by mouth 3 (three) times daily as needed for diarrhea or loose stools.   LORAZEPAM  (ATIVAN ) 0.5 MG TABLET    Take 1 tablet (0.5 mg total) by mouth every 12 (twelve) hours as needed for anxiety.   ONDANSETRON  (ZOFRAN ) 4 MG TABLET    Take 1 tablet (4 mg total) by mouth every 6 (six) hours as needed for nausea.   PREGABALIN  (LYRICA ) 25 MG CAPSULE    Take 1 capsule (25 mg total)  by mouth 2 (two) times daily.   SULFAMETHOXAZOLE -TRIMETHOPRIM  (BACTRIM  DS) 800-160 MG TABLET    Take 1 tablet by mouth 2 (two) times daily for 7 days.   TRIAMCINOLONE  OINTMENT (KENALOG ) 0.1 %    Apply 1 Application topically 2 (two) times daily.  Modified Medications   No medications on file  Discontinued Medications   No medications on file    Allergies: Allergies[1]  Labs: Lab Results  Component Value Date   HIV1RNAQUANT 30 06/13/2024   HIV1RNAQUANT 419 (H) 05/11/2024   HIV1RNAQUANT 2,100 (H) 04/06/2024   CD4TABS <35 (L) 05/11/2024   CD4TABS <35 (L) 02/22/2024   CD4TABS <35 (L) 11/22/2023    RPR and STI Lab Results   Component Value Date   LABRPR NON REACTIVE 02/13/2024   LABRPR NON-REACTIVE 02/25/2022   LABRPR NON REACTIVE 01/23/2021   LABRPR Reactive (A) 08/27/2020   LABRPR NON-REACTIVE 12/26/2019    STI Results GC CT  02/22/2024 11:30 AM Negative    Negative  Negative    Negative   02/25/2022 10:13 AM Negative  Negative   08/27/2020 10:59 AM Negative  Negative   12/06/2019 11:05 PM Negative  Negative   04/05/2019 12:00 AM Negative  Negative   03/10/2018 12:00 AM Negative  Negative   06/10/2016 12:00 AM Negative  Negative   03/05/2015 12:00 AM Negative  Negative     Hepatitis B Lab Results  Component Value Date   HEPBSAB NON-REACTIVE 02/22/2024   HEPBSAG NON-REACTIVE 10/19/2022   HEPBCAB NON REACTIVE 08/27/2020   Hepatitis C Lab Results  Component Value Date   HEPCAB NON-REACTIVE 02/22/2024   Hepatitis A Lab Results  Component Value Date   HAV REACTIVE (A) 02/22/2024   Lipids: Lab Results  Component Value Date   CHOL 104 02/22/2024   TRIG 65 02/22/2024   HDL 45 (L) 02/22/2024   CHOLHDL 2.3 02/22/2024   VLDL 11 06/10/2016   LDLCALC 45 02/22/2024    Current HIV Regimen: Biktarvy   TARGET DATE: 28th  Assessment: Corinthia presents today for her first injections of Cabenuva  for HIV treatment. Counseled that Cabenuva  is two separate intramuscular injections in the gluteal muscle on each side for each visit. Explained that the second injection is 30 days after the initial injection then every 2 months thereafter. Discussed the rare but significant chance of developing resistance despite compliance. Explained that showing up to injection appointments is very important and warned that if 2 appointments are missed, it will be reassessed by their provider whether they are a good candidate for injection therapy. Counseled on possible side effects associated with the injections such as injection site pain, which is usually mild to moderate in nature, injection site nodules, and  injection site reactions. Asked to call the clinic or send me a mychart message if they experience any issues, such as fatigue, nausea, headache, rash, or dizziness. Advised that they can take ibuprofen  or tylenol  for injection site pain if needed.   Administered cabotegravir  600mg /59mL in left upper outer quadrant of the gluteal muscle. Administered rilpivirine  900 mg/3mL in the right upper outer quadrant of the gluteal muscle. Monitored patient for 10 minutes after injection. Injections were tolerated well without issue. Counseled to stop taking Biktarvy  after today's dose and to call with any issues that may arise. Will make follow up appointments for second initiation injection in 30 days and then maintenance injections every 2 months thereafter.   Plan: - Stop Biktarvy  after today's dose - First Cabenuva  injections administered - Second set of  initiation injections scheduled for 3/3 with Corean - Maintenance injections scheduled for 4/23 with Alan - Call with any issues or questions  Maurilio Patten, PharmD PGY1 Pharmacy Resident [facility] 07/18/2024     [1]  Allergies Allergen Reactions   Bee Venom Anaphylaxis, Shortness Of Breath and Swelling   Morphine  Hives, Itching and Other (See Comments)    And- Tolerates hydrocodone , oxycodone , and codeine with no issues   Ferrlecit  [Na Ferric Gluc Cplx In Sucrose] Swelling and Other (See Comments)    Swelling and puffiness in hands and legs after Ferrlecit  250 mg infused over 60 minutes (possibly infusion related reaction)   Latex Hives   "

## 2024-07-19 ENCOUNTER — Encounter: Payer: Self-pay | Admitting: Obstetrics & Gynecology

## 2024-07-19 ENCOUNTER — Inpatient Hospital Stay: Attending: Oncology | Admitting: Obstetrics & Gynecology

## 2024-07-19 ENCOUNTER — Other Ambulatory Visit: Payer: Self-pay

## 2024-07-19 ENCOUNTER — Ambulatory Visit: Admitting: Pharmacist

## 2024-07-19 VITALS — BP 112/73 | HR 89 | Temp 98.1°F | Resp 16 | Wt 120.0 lb

## 2024-07-19 DIAGNOSIS — Z09 Encounter for follow-up examination after completed treatment for conditions other than malignant neoplasm: Secondary | ICD-10-CM

## 2024-07-19 DIAGNOSIS — Z21 Asymptomatic human immunodeficiency virus [HIV] infection status: Secondary | ICD-10-CM | POA: Diagnosis present

## 2024-07-19 DIAGNOSIS — D069 Carcinoma in situ of cervix, unspecified: Secondary | ICD-10-CM

## 2024-07-19 DIAGNOSIS — Z9889 Other specified postprocedural states: Secondary | ICD-10-CM | POA: Diagnosis not present

## 2024-07-19 DIAGNOSIS — B079 Viral wart, unspecified: Secondary | ICD-10-CM | POA: Diagnosis not present

## 2024-07-19 DIAGNOSIS — C4492 Squamous cell carcinoma of skin, unspecified: Secondary | ICD-10-CM

## 2024-07-19 MED ORDER — CABOTEGRAVIR & RILPIVIRINE ER 600 & 900 MG/3ML IM SUER
1.0000 | Freq: Once | INTRAMUSCULAR | Status: AC
  Administered 2024-07-19: 1 via INTRAMUSCULAR

## 2024-07-19 NOTE — Progress Notes (Signed)
 Follow Up Note/Postop check: Gyn-Onc  Beverly [name] 44 y.o. female  CC: Postop check   HPI: She is 6 wks s/p LEEP/vulva bx for an HSIL Pap and vulva lesions. She denies any abnormal discharge or vaginal bleeding.   Review of prior data 12/25:  SURGICAL PATHOLOGY CASE: [id number] PATIENT: [facility] Surgical Pathology Report     FINAL MICROSCOPIC DIAGNOSIS:  A. LEFT LABIAL GLUTEAL FOLD: Squamous mucosa showing a compact hyperparakeratosis with chronic inflammation Negative for dysplasia and diagnostic viral change  B. MONS @ 12:00, BIOPSY: Compatible with verruca vulgaris  C. RIGHT LABIA MAJORA, 10:00, BIOPSY: Squamous mucosa showing epithelial hyperplasia with a compact orthokeratotic hyperkeratosis and chronic inflammation Negative for dysplasia and diagnostic viral change  D. CERVIX, CONE, LEEP: Moderate to severe squamous dysplasia with mild dysplasia and HPV effect (HSIL, CIN-2/3) Dysplasia present circumferentially HSIL present in endocervical margin in 9-12:00 quadrant Ectocervical and deep stromal margins free  E. ENDOCERVICAL CURETTINGS: Mucohemorrhagic debris only Negative for endocervical epithelium    Review of Systems  Review of Systems  Constitutional:  Negative for malaise/fatigue and weight loss.  Respiratory:  Negative for shortness of breath and wheezing.   Cardiovascular:  Negative for chest pain and leg swelling.  Genitourinary:  Negative for dysuria, frequency, hematuria and urgency.  Musculoskeletal:  Negative for joint pain and myalgias.  Neurological:  Negative for weakness.  Psychiatric/Behavioral:  Negative for depression. The patient does not have insomnia.    Current medications, allergy, social history, past surgical history, past medical history, family history were all reviewed.    Vitals:  BP 112/73 (BP Location: Left Arm, Patient Position: Sitting)   Pulse 89   Temp 98.1 F (36.7 C) (Oral)   Resp 16   Wt 120 lb  (54.4 kg)   SpO2 100%   BMI 22.67 kg/m    Physical Exam:  Physical Exam Exam conducted with a chaperone present.  Constitutional:      General: She is not in acute distress. Cardiovascular:     Rate and Rhythm: Normal rate and regular rhythm.  Pulmonary:     Effort: Pulmonary effort is normal.     Breath sounds: Normal breath sounds. No wheezing or rhonchi.  Genitourinary:    General: Biopsy sites well-healed     Urethra: No urethral lesion.     Vagina: No lesions. No bleeding  Cevix: Cone bed well healed  BIMAN: no CMT, ut NT Musculoskeletal:     Cervical back: Neck supple.     Right lower leg: No edema.     Left lower leg: No edema.  Lymphadenopathy:     Upper Body:     Right upper body: No supraclavicular adenopathy.     Left upper body: No supraclavicular adenopathy.     Lower Body: No right inguinal adenopathy. No left inguinal adenopathy.  Skin:    Findings: No rash.  Neurological:     Mental Status: She is oriented to person, place, and time.   Assessment/Plan:  Assessment & Plan CIN III with severe dysplasia     Postop check     Verruca vulgaris     S/P LEEP w/pos margin; healing well  - recommend observation (with HPV-based testing in six months); discussed option of repeat conization - discussed vaccination as part of the management of CIN to lower the rate of CIN recurrence; education materials provided - consider treatment of the verruca vulgaris w/topical salicylic acid if desired - return prn or in 5 mos  [place], MD

## 2024-07-19 NOTE — Progress Notes (Signed)
 NEW REFERRAL TO [facility]

## 2024-07-19 NOTE — Patient Instructions (Signed)
" °  °  VISIT SUMMARY: During your visit, we discussed your follow-up care after your recent LEEP procedure. The procedure was performed due to the presence of precancerous cells, and while the lower margin was clear, the upper margin still had some precancerous cells. You have been doing better since the procedure and hospitalization.  YOUR PLAN: -CARCINOMA IN SITU OF CERVIX, POST-LEEP: Carcinoma in situ of the cervix means that there are abnormal cells on the surface of the cervix that could potentially develop into cancer. After your LEEP procedure, the lower margin was clear, but the upper margin still had some precancerous cells. We do not recommend repeating the LEEP or having a hysterectomy at this time. Instead, we will repeat a Pap smear and HPV test in five months to monitor your condition. Additionally, information about the HPV vaccine is provided in this summary.  INSTRUCTIONS: Please schedule a repeat Pap smear and HPV test in five months to monitor your condition. Review the information provided about the HPV vaccine and discuss it with your healthcare provider if you have any questions.    Contains text generated by [employer].     "

## 2024-07-19 NOTE — Assessment & Plan Note (Addendum)
 SABRA

## 2024-07-26 ENCOUNTER — Other Ambulatory Visit

## 2024-07-26 ENCOUNTER — Ambulatory Visit: Admitting: Oncology

## 2024-08-22 ENCOUNTER — Encounter: Payer: Self-pay | Admitting: Infectious Diseases

## 2024-08-23 ENCOUNTER — Other Ambulatory Visit

## 2024-08-23 ENCOUNTER — Ambulatory Visit: Admitting: Oncology

## 2024-10-02 ENCOUNTER — Ambulatory Visit (HOSPITAL_COMMUNITY): Admitting: Family

## 2024-10-12 ENCOUNTER — Ambulatory Visit: Payer: Self-pay | Admitting: Pharmacist

## 2024-11-29 ENCOUNTER — Inpatient Hospital Stay: Admitting: Obstetrics & Gynecology
